# Patient Record
Sex: Male | Born: 1935
Health system: Southern US, Community
[De-identification: ages and names within clinical notes are randomized; demographics above are authoritative.]

## PROBLEM LIST (undated history)

## (undated) DIAGNOSIS — E785 Hyperlipidemia, unspecified: Secondary | ICD-10-CM

## (undated) DIAGNOSIS — I454 Nonspecific intraventricular block: Secondary | ICD-10-CM

## (undated) DIAGNOSIS — M1990 Unspecified osteoarthritis, unspecified site: Secondary | ICD-10-CM

## (undated) DIAGNOSIS — I739 Peripheral vascular disease, unspecified: Secondary | ICD-10-CM

## (undated) DIAGNOSIS — I639 Cerebral infarction, unspecified: Secondary | ICD-10-CM

## (undated) DIAGNOSIS — E7800 Pure hypercholesterolemia, unspecified: Secondary | ICD-10-CM

## (undated) DIAGNOSIS — I10 Essential (primary) hypertension: Secondary | ICD-10-CM

## (undated) HISTORY — DX: Essential (primary) hypertension: I10

## (undated) HISTORY — DX: Cerebral infarction, unspecified: I63.9

## (undated) HISTORY — DX: Hyperlipidemia, unspecified: E78.5

---

## 1999-08-07 DIAGNOSIS — I10 Essential (primary) hypertension: Secondary | ICD-10-CM

## 1999-08-07 DIAGNOSIS — E785 Hyperlipidemia, unspecified: Secondary | ICD-10-CM

## 1999-08-07 HISTORY — DX: Hyperlipidemia, unspecified: E78.5

## 1999-08-07 HISTORY — DX: Essential (primary) hypertension: I10

## 2002-04-01 ENCOUNTER — Encounter: Payer: Self-pay | Admitting: Emergency Medicine

## 2002-04-01 ENCOUNTER — Inpatient Hospital Stay (HOSPITAL_COMMUNITY): Admission: EM | Admit: 2002-04-01 | Discharge: 2002-04-04 | Payer: Self-pay | Admitting: Emergency Medicine

## 2002-04-02 ENCOUNTER — Encounter: Payer: Self-pay | Admitting: Pediatrics

## 2002-04-02 ENCOUNTER — Encounter: Payer: Self-pay | Admitting: *Deleted

## 2002-04-04 ENCOUNTER — Inpatient Hospital Stay (HOSPITAL_COMMUNITY)
Admission: RE | Admit: 2002-04-04 | Discharge: 2002-04-27 | Payer: Self-pay | Admitting: Physical Medicine & Rehabilitation

## 2002-04-06 ENCOUNTER — Encounter: Payer: Self-pay | Admitting: Physical Medicine & Rehabilitation

## 2002-04-23 ENCOUNTER — Encounter: Payer: Self-pay | Admitting: Pediatrics

## 2002-04-23 ENCOUNTER — Encounter: Payer: Self-pay | Admitting: Physical Medicine & Rehabilitation

## 2002-08-24 ENCOUNTER — Encounter
Admission: RE | Admit: 2002-08-24 | Discharge: 2002-11-22 | Payer: Self-pay | Admitting: Physical Medicine & Rehabilitation

## 2002-12-21 ENCOUNTER — Encounter
Admission: RE | Admit: 2002-12-21 | Discharge: 2003-03-21 | Payer: Self-pay | Admitting: Physical Medicine & Rehabilitation

## 2017-03-29 ENCOUNTER — Ambulatory Visit (INDEPENDENT_AMBULATORY_CARE_PROVIDER_SITE_OTHER): Payer: Medicare Other | Admitting: Podiatry

## 2017-03-29 ENCOUNTER — Encounter: Payer: Self-pay | Admitting: Podiatry

## 2017-03-29 VITALS — BP 133/86 | HR 50 | Resp 18

## 2017-03-29 DIAGNOSIS — L84 Corns and callosities: Secondary | ICD-10-CM

## 2017-03-29 DIAGNOSIS — Z8673 Personal history of transient ischemic attack (TIA), and cerebral infarction without residual deficits: Secondary | ICD-10-CM | POA: Diagnosis not present

## 2017-03-29 DIAGNOSIS — R29898 Other symptoms and signs involving the musculoskeletal system: Secondary | ICD-10-CM

## 2017-03-29 DIAGNOSIS — M7740 Metatarsalgia, unspecified foot: Secondary | ICD-10-CM | POA: Diagnosis not present

## 2017-03-29 NOTE — Progress Notes (Signed)
   Subjective:    Patient ID: [name], male    DOB: 1935-10-16, 81 y.o.   MRN: [id number]  HPI  *Goes by [name] Presents the office today for concerns of calluses the balls of his feet as well as requesting new custom shoes. He wears a brace in the right foot secondary to a stroke and he had in 2001. He gets pain involving his foot at times mostly with pressure and weightbearing. Denies any recent injury,. This is a chronic issue. Denies any redness or drainage. Denies any open sores. He has no other concerns today.  Review of Systems  All other systems reviewed and are negative.      Objective:   Physical Exam General: AAO x3, NAD  Dermatological: Skin is warm, dry and supple bilateral. Minimal hyperkeratotic tissue is present right first metatarsal 3 and 5. There is no smoking hyperkeratotic tissue buildup on the left foot. Upon debridement there is no underlying ulceration, drainage or any clinical signs of infection present. Is no open lesions or other pre-ulcer lesions identified today.  Vascular: Dorsalis Pedis artery and Posterior Tibial artery pedal pulses are palpable bilateral with immedate capillary fill time. There is no pain with calf compression, swelling, warmth, erythema.   Neruologic: Sensation decreased on the right side with Sims once the monofilament.   Musculoskeletal: Strength is decreased on the right side secondary to stroke. Is no area pinpoint tenderness. There is plantar flexion of metatarsal heads with atrophy of the fat pad. He has tenderness to palpation submetatarsal 5 bilaterally as well as hyperkeratotic lesion right second metatarsal 3. After debridement was resolution pain. The pain is getting is likely due to the prominent metatarsal heads. No other areas of tenderness. There is no overlying edema, erythema, increase in warmth.   Assessment: Hyperkeratotic lesions 2; right leg weakness secondary to stroke   Plan: -Treatment options  discussed including all alternatives, risks, and complications -Etiology of symptoms were discussed -Lesions were sharply debrided 2 without any complications or bleeding.  -Discussed the new custom shoe. We'll get this prior authorized through his insurance and I discussed this with Dawn. We'll have him come in to see [facility].  -Metatarsal pad added to his inserts today to see if this will help.   Ovid Curd, DPM

## 2017-04-01 ENCOUNTER — Ambulatory Visit: Payer: Medicare Other | Admitting: Orthotics

## 2017-04-01 DIAGNOSIS — L84 Corns and callosities: Secondary | ICD-10-CM

## 2017-04-01 DIAGNOSIS — R29898 Other symptoms and signs involving the musculoskeletal system: Secondary | ICD-10-CM

## 2017-04-01 NOTE — Progress Notes (Signed)
Patient came in for orthopedic shoes that are an integral part of a brace (MAFO).  Patient was referred to [facility] to provide.

## 2017-04-11 ENCOUNTER — Other Ambulatory Visit: Payer: Self-pay | Admitting: Urology

## 2017-04-11 DIAGNOSIS — R972 Elevated prostate specific antigen [PSA]: Secondary | ICD-10-CM

## 2017-05-20 ENCOUNTER — Other Ambulatory Visit: Payer: Self-pay

## 2017-05-28 ENCOUNTER — Ambulatory Visit
Admission: RE | Admit: 2017-05-28 | Discharge: 2017-05-28 | Disposition: A | Discharge status: Home / Self Care | Payer: Medicare Other | Source: Ambulatory Visit | Attending: Urology | Admitting: Urology

## 2017-05-28 DIAGNOSIS — R972 Elevated prostate specific antigen [PSA]: Secondary | ICD-10-CM

## 2017-05-28 MED ORDER — GADOBENATE DIMEGLUMINE 529 MG/ML IV SOLN
15.0000 mL | Freq: Once | INTRAVENOUS | Status: AC | PRN
  Administered 2017-05-28: 15 mL via INTRAVENOUS

## 2017-08-26 ENCOUNTER — Ambulatory Visit (INDEPENDENT_AMBULATORY_CARE_PROVIDER_SITE_OTHER): Payer: Medicare Other | Admitting: Podiatry

## 2017-08-26 DIAGNOSIS — M21379 Foot drop, unspecified foot: Secondary | ICD-10-CM

## 2017-08-26 DIAGNOSIS — Z8673 Personal history of transient ischemic attack (TIA), and cerebral infarction without residual deficits: Secondary | ICD-10-CM

## 2017-08-26 DIAGNOSIS — R2689 Other abnormalities of gait and mobility: Secondary | ICD-10-CM

## 2017-08-27 NOTE — Progress Notes (Signed)
82 year old male presents the office today requesting new prescription for custom shoes.  The prescription expired and he needs a new prescription.  He has no other concerns today.  He denies any open sores or swelling he has no pain to his feet he just needs a new prescription.  A new prescription was provided for [facility] today.  I did check his feet there is no open sores, calluses or any pain or swelling.  Updated prescription was provided today.  He does not want a new brace.  No charge for today's visit  Follow-up after he gets the shoes or sooner if needed.  Discussed daily foot inspection.  [name] DPM

## 2018-05-15 ENCOUNTER — Encounter: Payer: Self-pay | Admitting: Internal Medicine

## 2018-05-15 ENCOUNTER — Ambulatory Visit: Payer: Medicare Other

## 2018-05-15 ENCOUNTER — Ambulatory Visit (INDEPENDENT_AMBULATORY_CARE_PROVIDER_SITE_OTHER): Payer: Medicare Other | Admitting: Internal Medicine

## 2018-05-15 VITALS — BP 122/62 | HR 63 | Temp 97.9°F | Ht 69.0 in | Wt 167.0 lb

## 2018-05-15 DIAGNOSIS — I10 Essential (primary) hypertension: Secondary | ICD-10-CM | POA: Diagnosis not present

## 2018-05-15 DIAGNOSIS — Z23 Encounter for immunization: Secondary | ICD-10-CM | POA: Diagnosis not present

## 2018-05-15 DIAGNOSIS — Z0000 Encounter for general adult medical examination without abnormal findings: Secondary | ICD-10-CM

## 2018-05-15 DIAGNOSIS — R7303 Prediabetes: Secondary | ICD-10-CM | POA: Diagnosis not present

## 2018-05-15 MED ORDER — CLOPIDOGREL BISULFATE 75 MG PO TABS
75.0000 mg | ORAL_TABLET | Freq: Once | ORAL | 0 refills | Status: DC
Start: 2018-05-15 — End: 2018-05-29

## 2018-05-15 NOTE — Progress Notes (Signed)
  Subjective:     Patient ID: [name] , male    DOB: 10-25-1935 , 82 y.o.   MRN: [id number]   Pt is here for HTN FU and came for Medicare visit.  His niece is here with him and states he has been doing fairly well. He takes charge of taking his own meds and she does not need to place them in a med box.  Had a flu shot at [facility] 04/2018 Past Medical History:  Diagnosis Date  . Hyperlipidemia 2001  . Hypertension 2001  . Stroke (HCC)     No Known Allergies  Current Outpatient Medications:  .  aspirin EC 81 MG tablet, Take 81 mg by mouth daily., Disp: , Rfl:  .  cetirizine (ZYRTEC) 10 MG tablet, Take 10 mg by mouth as needed for allergies., Disp: , Rfl:  .  clopidogrel (PLAVIX) 75 MG tablet, , Disp: , Rfl: 0 .  diltiazem (CARDIZEM CD) 180 MG 24 hr capsule, , Disp: , Rfl: 0 .  lisinopril-hydrochlorothiazide (PRINZIDE,ZESTORETIC) 10-12.5 MG tablet, , Disp: , Rfl: 0 .  rosuvastatin (CRESTOR) 10 MG tablet, Take 10 mg by mouth daily., Disp: , Rfl: 0   Review of Systems  Constitutional: Negative for appetite change and diaphoresis.  HENT: Negative for tinnitus.   Cardiovascular: Negative for chest pain, palpitations and leg swelling.  Gastrointestinal: Negative.   Endocrine: Negative for polydipsia and polyphagia.       Denies hypoglycemia  Genitourinary: Negative for dysuria, frequency and urgency.  Musculoskeletal: Positive for gait problem.  Skin: Negative for wound.  Neurological: Negative for numbness and headaches.       Objective:  Physical Exam  Constitutional: He is oriented to person, place, and time. He appears well-developed. No distress.  HENT:  Head: Normocephalic.  Nose: Nose normal.  He is hard of hearing  Eyes: Conjunctivae are normal. No scleral icterus.  Neck: Normal range of motion. Neck supple. No thyromegaly present.  Cardiovascular: Normal rate and regular rhythm.  No murmur heard. Pulmonary/Chest: Effort normal and breath sounds normal.   Lymphadenopathy:    He has no cervical adenopathy.  Neurological: He is alert and oriented to person, place, and time.  Uses a caine, needs assistance to get off the chair to walk. Wears a R wrist brace and R leg brace  Skin: Skin is warm and dry. No rash noted. He is not diaphoretic.  Psychiatric: He has a normal mood and affect. His behavior is normal. Judgment and thought content normal.  Vitals reviewed.   Today's Vitals   05/15/18 1634  BP: 122/62  Pulse: 63  Temp: 97.9 F (36.6 C)  TempSrc: Oral  SpO2: 98%  Weight: 167 lb (75.8 kg)  Height: 5\' 9"  (1.753 m)   Body mass index is 24.66 kg/m. Today's Vitals   05/15/18 1634  BP: 122/62  Pulse: 63  Temp: 97.9 F (36.6 C)  TempSrc: Oral  SpO2: 98%  Weight: 167 lb (75.8 kg)  Height: 5\' 9"  (1.753 m)   Body mass index is 24.66 kg/m. Assessment And Plan:     1. Immunization due  - Pneumococcal conjugate vaccine 13-valent    2- HTN- stable, CBC and CMP ordered. Refill sent. He was not able to void.   3- Prediabetes- HGBA1C ordered  We will call back when results come in. Fu as scheduled in December for yearly physical. He can have lipids done then.   Reagen Haberman RODRIGUEZ-SOUTHWORTH, PA-C

## 2018-05-15 NOTE — Progress Notes (Signed)
Subjective:   [name] is a 82 y.o. male who presents for Medicare Annual/Subsequent preventive examination.  Review of Systems:  N/A Cardiac Risk Factors include: advanced age (>46men, >17 women);hypertension     Objective:    Vitals: BP 122/62 (BP Location: Left Arm)   Pulse 63   Temp 97.9 F (36.6 C)   Ht 5\' 9"  (1.753 m)   Wt 167 lb (75.8 kg)   SpO2 98%   BMI 24.66 kg/m   Body mass index is 24.66 kg/m.  Advanced Directives 05/15/2018  Does Patient Have a Medical Advance Directive? Yes  Type of Advance Directive Healthcare Power of Attorney  Copy of Healthcare Power of Attorney in Chart? No - copy requested    Tobacco Social History   Tobacco Use  Smoking Status Never Smoker  Smokeless Tobacco Never Used     Counseling given: Not Answered   Clinical Intake:  Pre-visit preparation completed: Yes  Pain : No/denies pain Pain Score: 0-No pain     Nutritional Status: BMI of 19-24  Normal Nutritional Risks: None Diabetes: No  How often do you need to have someone help you when you read instructions, pamphlets, or other written materials from your doctor or pharmacy?: 4 - Often What is the last grade level you completed in school?: 7th grade  Interpreter Needed?: No  Information entered by :: NAllenLPN  Past Medical History:  Diagnosis Date  . Hyperlipidemia 2001  . Hypertension 2001  . Stroke [facility])    History reviewed. No pertinent surgical history. Family History  Problem Relation Age of Onset  . Stroke Mother   . Stroke Father   . Heart attack Father   . Cancer Brother    Social History   Socioeconomic History  . Marital status: Single    Spouse name: Not on file  . Number of children: Not on file  . Years of education: Not on file  . Highest education level: Not on file  Occupational History  . Occupation: retired  Engineer, production  . Financial resource strain: Not hard at all  . Food insecurity:    Worry: Never true   Inability: Never true  . Transportation needs:    Medical: No    Non-medical: No  Tobacco Use  . Smoking status: Never Smoker  . Smokeless tobacco: Never Used  Substance and Sexual Activity  . Alcohol use: Yes    Comment: on occassion  . Drug use: Never  . Sexual activity: Not Currently  Lifestyle  . Physical activity:    Days per week: 4 days    Minutes per session: 20 min  . Stress: Not at all  Relationships  . Social connections:    Talks on phone: Not on file    Gets together: Not on file    Attends religious service: Not on file    Active member of club or organization: Not on file    Attends meetings of clubs or organizations: Not on file    Relationship status: Not on file  Other Topics Concern  . Not on file  Social History Narrative  . Not on file    Outpatient Encounter Medications as of 05/15/2018  Medication Sig  . aspirin EC 81 MG tablet Take 81 mg by mouth daily.  . cetirizine (ZYRTEC) 10 MG tablet Take 10 mg by mouth as needed for allergies.  [name] diltiazem (CARDIZEM CD) 180 MG 24 hr capsule   . lisinopril-hydrochlorothiazide (PRINZIDE,ZESTORETIC) 10-12.5 MG tablet   .  rosuvastatin (CRESTOR) 10 MG tablet Take 10 mg by mouth daily.  . [DISCONTINUED] clopidogrel (PLAVIX) 75 MG tablet    No facility-administered encounter medications on file as of 05/15/2018.     Activities of Daily Living In your present state of health, do you have any difficulty performing the following activities: 05/15/2018  Hearing? Y  Comment Has hearing aides, but can not get use to them  Vision? N  Difficulty concentrating or making decisions? N  Walking or climbing stairs? Y  Comment Uses cane  Dressing or bathing? N  Doing errands, shopping? N  Preparing Food and eating ? N  Using the Toilet? N  In the past six months, have you accidently leaked urine? N  Do you have problems with loss of bowel control? N  Managing your Medications? N  Managing your Finances? N    Housekeeping or managing your Housekeeping? N  Some recent data might be hidden    Patient Care Team: Dorothyann Peng, MD as PCP - General (Internal Medicine) Mateo Flow, MD as Consulting Physician (Ophthalmology) [facility] [place], MD as Attending Physician   Assessment:   This is a routine wellness examination for Micajah.  Exercise Activities and Dietary recommendations Current Exercise Habits: Home exercise routine, Type of exercise: walking, Time (Minutes): 20, Frequency (Times/Week): 4, Weekly Exercise (Minutes/Week): 80, Intensity: Mild, Exercise limited by: orthopedic condition(s)  Goals    . DIET - INCREASE WATER INTAKE (pt-stated)     Wants to increase water intake form 2 to 4 glasses a day       Fall Risk Fall Risk  05/15/2018  Falls in the past year? No  Risk for fall due to : Impaired balance/gait;Medication side effect;Impaired mobility   Is the patient's home free of loose throw rugs in walkways, pet beds, electrical cords, etc?   yes      Grab bars in the bathroom? yes      Handrails on the stairs?   N/A      Adequate lighting?   yes  Timed Get Up and Go Performed: N/A  Depression Screen PHQ 2/9 Scores 05/15/2018  PHQ - 2 Score 0  PHQ- 9 Score 0    Cognitive Function     6CIT Screen 05/15/2018  What Year? 4 points  What month? 0 points  What time? 3 points  Count back from 20 4 points  Months in reverse 4 points  Repeat phrase 10 points  Total Score 25    Immunization History  Administered Date(s) Administered  . Influenza-Unspecified 03/24/2018  . Tdap 06/22/2013    Qualifies for Shingles Vaccine? yes  Screening Tests Health Maintenance  Topic Date Due  . PNA vac Low Risk Adult (1 of 2 - PCV13) 09/20/2002  . TETANUS/TDAP  06/23/2023  . INFLUENZA VACCINE  Completed   Cancer Screenings: Lung: Low Dose CT Chest recommended if Age 64-80 years, 30 pack-year currently smoking OR have quit w/in 15years. Patient does not  qualify. Colorectal: up to date   Additional Screenings:  Hepatitis C Screening:N/A      Plan:    Patient showed a memory deficit with 6CIT. Patient due for next colonoscopy in 2021 due to a history of polyps.  I have personally reviewed and noted the following in the patient's chart:   . Medical and social history . Use of alcohol, tobacco or illicit drugs  . Current medications and supplements . Functional ability and status . Nutritional status . Physical activity . Advanced directives .  List of other physicians . Hospitalizations, surgeries, and ER visits in previous 12 months . Vitals . Screenings to include cognitive, depression, and falls . Referrals and appointments  In addition, I have reviewed and discussed with patient certain preventive protocols, quality metrics, and best practice recommendations. A written personalized care plan for preventive services as well as general preventive health recommendations were provided to patient.     Barb Merino, LPN  03/[id number]

## 2018-05-15 NOTE — Patient Instructions (Addendum)
[name] , Thank you for taking time to come for your Medicare Wellness Visit. I appreciate your ongoing commitment to your health goals. Please review the following plan we discussed and let me know if I can assist you in the future.   Screening recommendations/referrals: Colonoscopy: up to date Recommended yearly ophthalmology/optometry visit for glaucoma screening and checkup Recommended yearly dental visit for hygiene and checkup  Vaccinations: Influenza vaccine: 03/24/2018 Pneumococcal vaccine: to be ordered Tdap vaccine: 06/22/2013 Shingles vaccine: decline    Advanced directives: Please bring a copy of your POA (Power of Attorney) and/or Living Will to your next appointment.    Conditions/risks identified: Cognitive decline  Next appointment: 07/18/2018 at 9:00a  Preventive Care 65 Years and Older, Male Preventive care refers to lifestyle choices and visits with your health care provider that can promote health and wellness. What does preventive care include?  A yearly physical exam. This is also called an annual well check.  Dental exams once or twice a year.  Routine eye exams. Ask your health care provider how often you should have your eyes checked.  Personal lifestyle choices, including:  Daily care of your teeth and gums.  Regular physical activity.  Eating a healthy diet.  Avoiding tobacco and drug use.  Limiting alcohol use.  Practicing safe sex.  Taking low doses of aspirin every day.  Taking vitamin and mineral supplements as recommended by your health care provider. What happens during an annual well check? The services and screenings done by your health care provider during your annual well check will depend on your age, overall health, lifestyle risk factors, and family history of disease. Counseling  Your health care provider may ask you questions about your:  Alcohol use.  Tobacco use.  Drug use.  Emotional well-being.  Home and  relationship well-being.  Sexual activity.  Eating habits.  History of falls.  Memory and ability to understand (cognition).  Work and work Astronomer. Screening  You may have the following tests or measurements:  Height, weight, and BMI.  Blood pressure.  Lipid and cholesterol levels. These may be checked every 5 years, or more frequently if you are over 26 years old.  Skin check.  Lung cancer screening. You may have this screening every year starting at age 80 if you have a 30-pack-year history of smoking and currently smoke or have quit within the past 15 years.  Fecal occult blood test (FOBT) of the stool. You may have this test every year starting at age 80.  Flexible sigmoidoscopy or colonoscopy. You may have a sigmoidoscopy every 5 years or a colonoscopy every 10 years starting at age 80.  Prostate cancer screening. Recommendations will vary depending on your family history and other risks.  Hepatitis C blood test.  Hepatitis B blood test.  Sexually transmitted disease (STD) testing.  Diabetes screening. This is done by checking your blood sugar (glucose) after you have not eaten for a while (fasting). You may have this done every 1-3 years.  Abdominal aortic aneurysm (AAA) screening. You may need this if you are a current or former smoker.  Osteoporosis. You may be screened starting at age 80 if you are at high risk. Talk with your health care provider about your test results, treatment options, and if necessary, the need for more tests. Vaccines  Your health care provider may recommend certain vaccines, such as:  Influenza vaccine. This is recommended every year.  Tetanus, diphtheria, and acellular pertussis (Tdap, Td) vaccine. You may  need a Td booster every 10 years.  Zoster vaccine. You may need this after age 60.  Pneumococcal 13-valent conjugate (PCV13) vaccine. One dose is recommended after age 31.  Pneumococcal polysaccharide (PPSV23) vaccine. One  dose is recommended after age 80. Talk to your health care provider about which screenings and vaccines you need and how often you need them. This information is not intended to replace advice given to you by your health care provider. Make sure you discuss any questions you have with your health care provider. Document Released: 08/19/2015 Document Revised: 04/11/2016 Document Reviewed: 05/24/2015 Elsevier Interactive Patient Education  2017 [place] Prevention in the Home Falls can cause injuries. They can happen to people of all ages. There are many things you can do to make your home safe and to help prevent falls. What can I do on the outside of my home?  Regularly fix the edges of walkways and driveways and fix any cracks.  Remove anything that might make you trip as you walk through a door, such as a raised step or threshold.  Trim any bushes or trees on the path to your home.  Use bright outdoor lighting.  Clear any walking paths of anything that might make someone trip, such as rocks or tools.  Regularly check to see if handrails are loose or broken. Make sure that both sides of any steps have handrails.  Any raised decks and porches should have guardrails on the edges.  Have any leaves, snow, or ice cleared regularly.  Use sand or salt on walking paths during winter.  Clean up any spills in your garage right away. This includes oil or grease spills. What can I do in the bathroom?  Use night lights.  Install grab bars by the toilet and in the tub and shower. Do not use towel bars as grab bars.  Use non-skid mats or decals in the tub or shower.  If you need to sit down in the shower, use a plastic, non-slip stool.  Keep the floor dry. Clean up any water that spills on the floor as soon as it happens.  Remove soap buildup in the tub or shower regularly.  Attach bath mats securely with double-sided non-slip rug tape.  Do not have throw rugs and other  things on the floor that can make you trip. What can I do in the bedroom?  Use night lights.  Make sure that you have a light by your bed that is easy to reach.  Do not use any sheets or blankets that are too big for your bed. They should not hang down onto the floor.  Have a firm chair that has side arms. You can use this for support while you get dressed.  Do not have throw rugs and other things on the floor that can make you trip. What can I do in the kitchen?  Clean up any spills right away.  Avoid walking on wet floors.  Keep items that you use a lot in easy-to-reach places.  If you need to reach something above you, use a strong step stool that has a grab bar.  Keep electrical cords out of the way.  Do not use floor polish or wax that makes floors slippery. If you must use wax, use non-skid floor wax.  Do not have throw rugs and other things on the floor that can make you trip. What can I do with my stairs?  Do not leave any items on  the stairs.  Make sure that there are handrails on both sides of the stairs and use them. Fix handrails that are broken or loose. Make sure that handrails are as long as the stairways.  Check any carpeting to make sure that it is firmly attached to the stairs. Fix any carpet that is loose or worn.  Avoid having throw rugs at the top or bottom of the stairs. If you do have throw rugs, attach them to the floor with carpet tape.  Make sure that you have a light switch at the top of the stairs and the bottom of the stairs. If you do not have them, ask someone to add them for you. What else can I do to help prevent falls?  Wear shoes that:  Do not have high heels.  Have rubber bottoms.  Are comfortable and fit you well.  Are closed at the toe. Do not wear sandals.  If you use a stepladder:  Make sure that it is fully opened. Do not climb a closed stepladder.  Make sure that both sides of the stepladder are locked into place.  Ask  someone to hold it for you, if possible.  Clearly mark and make sure that you can see:  Any grab bars or handrails.  First and last steps.  Where the edge of each step is.  Use tools that help you move around (mobility aids) if they are needed. These include:  Canes.  Walkers.  Scooters.  Crutches.  Turn on the lights when you go into a dark area. Replace any light bulbs as soon as they burn out.  Set up your furniture so you have a clear path. Avoid moving your furniture around.  If any of your floors are uneven, fix them.  If there are any pets around you, be aware of where they are.  Review your medicines with your doctor. Some medicines can make you feel dizzy. This can increase your chance of falling. Ask your doctor what other things that you can do to help prevent falls. This information is not intended to replace advice given to you by your health care provider. Make sure you discuss any questions you have with your health care provider. Document Released: 05/19/2009 Document Revised: 12/29/2015 Document Reviewed: 08/27/2014 Elsevier Interactive Patient Education  2017 [employer].

## 2018-05-16 LAB — CMP14 + ANION GAP
ALT: 47 IU/L — ABNORMAL HIGH (ref 0–44)
AST: 34 IU/L (ref 0–40)
Albumin/Globulin Ratio: 2.1 (ref 1.2–2.2)
Albumin: 4.4 g/dL (ref 3.5–4.7)
Alkaline Phosphatase: 82 IU/L (ref 39–117)
Anion Gap: 15 mmol/L (ref 10.0–18.0)
BUN/Creatinine Ratio: 14 (ref 10–24)
BUN: 15 mg/dL (ref 8–27)
Bilirubin Total: 0.6 mg/dL (ref 0.0–1.2)
CO2: 24 mmol/L (ref 20–29)
Calcium: 9.5 mg/dL (ref 8.6–10.2)
Chloride: 98 mmol/L (ref 96–106)
Creatinine, Ser: 1.07 mg/dL (ref 0.76–1.27)
GFR calc Af Amer: 75 mL/min/{1.73_m2} (ref >59)
GFR calc non Af Amer: 65 mL/min/{1.73_m2} (ref >59)
Globulin, Total: 2.1 g/dL (ref 1.5–4.5)
Glucose: 103 mg/dL — ABNORMAL HIGH (ref 65–99)
Potassium: 4.2 mmol/L (ref 3.5–5.2)
Sodium: 137 mmol/L (ref 134–144)
Total Protein: 6.5 g/dL (ref 6.0–8.5)

## 2018-05-16 LAB — CBC
Hematocrit: 38.4 % (ref 37.5–51.0)
Hemoglobin: 13.5 g/dL (ref 13.0–17.7)
MCH: 31.9 pg (ref 26.6–33.0)
MCHC: 35.2 g/dL (ref 31.5–35.7)
MCV: 91 fL (ref 79–97)
Platelets: 260 10*3/uL (ref 150–450)
RBC: 4.23 x10E6/uL (ref 4.14–5.80)
RDW: 15.1 % (ref 12.3–15.4)
WBC: 5.8 10*3/uL (ref 3.4–10.8)

## 2018-05-16 LAB — HEMOGLOBIN A1C
Est. average glucose Bld gHb Est-mCnc: 123 mg/dL
Hgb A1c MFr Bld: 5.9 % — ABNORMAL HIGH (ref 4.8–5.6)

## 2018-05-24 ENCOUNTER — Other Ambulatory Visit: Payer: Self-pay | Admitting: Nurse Practitioner

## 2018-05-29 ENCOUNTER — Other Ambulatory Visit: Payer: Self-pay | Admitting: Internal Medicine

## 2018-06-02 ENCOUNTER — Other Ambulatory Visit: Payer: Self-pay

## 2018-06-02 MED ORDER — CLOPIDOGREL BISULFATE 75 MG PO TABS: 75.0000 mg | ORAL_TABLET | Freq: Every day | ORAL | 0 refills | Status: AC

## 2018-07-01 ENCOUNTER — Other Ambulatory Visit: Payer: Self-pay | Admitting: Nurse Practitioner

## 2018-07-18 ENCOUNTER — Ambulatory Visit: Payer: Medicare Other | Admitting: Nurse Practitioner

## 2018-08-09 ENCOUNTER — Other Ambulatory Visit: Payer: Self-pay | Admitting: Internal Medicine

## 2018-08-09 ENCOUNTER — Other Ambulatory Visit: Payer: Self-pay | Admitting: Nurse Practitioner

## 2018-09-29 ENCOUNTER — Other Ambulatory Visit: Payer: Self-pay | Admitting: Nurse Practitioner

## 2018-11-03 ENCOUNTER — Other Ambulatory Visit: Payer: Self-pay | Admitting: Nurse Practitioner

## 2018-11-05 ENCOUNTER — Other Ambulatory Visit: Payer: Self-pay | Admitting: Nurse Practitioner

## 2018-11-17 ENCOUNTER — Ambulatory Visit: Payer: Medicare Other | Admitting: Nurse Practitioner

## 2018-11-18 ENCOUNTER — Other Ambulatory Visit: Payer: Self-pay

## 2018-11-18 ENCOUNTER — Ambulatory Visit (INDEPENDENT_AMBULATORY_CARE_PROVIDER_SITE_OTHER): Payer: Medicare Other | Admitting: Nurse Practitioner

## 2018-11-18 ENCOUNTER — Encounter: Payer: Self-pay | Admitting: Nurse Practitioner

## 2018-11-18 DIAGNOSIS — I69351 Hemiplegia and hemiparesis following cerebral infarction affecting right dominant side: Secondary | ICD-10-CM | POA: Diagnosis not present

## 2018-11-18 DIAGNOSIS — R7303 Prediabetes: Secondary | ICD-10-CM

## 2018-11-18 DIAGNOSIS — G8191 Hemiplegia, unspecified affecting right dominant side: Secondary | ICD-10-CM

## 2018-11-18 DIAGNOSIS — I639 Cerebral infarction, unspecified: Secondary | ICD-10-CM

## 2018-11-18 DIAGNOSIS — I10 Essential (primary) hypertension: Secondary | ICD-10-CM

## 2018-11-18 NOTE — Progress Notes (Signed)
This visit type was conducted due to national recommendations for restrictions regarding the COVID-19 Pandemic (e.g. social distancing). This format is felt to be most appropriate for this patient at this time.  All issues noted in this document were discussed and addressed.  No physical exam was performed (except for noted visual exam findings with Video Visits).  Please refer to the patient's chart (MyChart message for video visits and phone note for telephone visits) for the patient's consent to telehealth for [facility].   Subjective:     Patient ID: [name] , male    DOB: 1936-08-05 , 83 y.o.   MRN: [id number]  Virtual Visit via Telephone Note  I connected with Theodis Aguas and his Niece on 11/18/18 at  3:15 PM EDT by telephone and verified that I am speaking with the correct person using two identifiers.   I discussed the limitations, risks, security and privacy concerns of performing an evaluation and management service by telephone and the availability of in person appointments. I also discussed with the patient that there may be a patient responsible charge related to this service. The patient expressed understanding and agreed to proceed.  Chief Complaint  Patient presents with  . Hypertension    History of Present Illness:   108/57 HR 60  Family is with him [name]   Hypertension  This is a chronic problem. The current episode started more than 1 year ago. The problem is unchanged. The problem is controlled. Pertinent negatives include no anxiety, blurred vision, chest pain, palpitations or shortness of breath. There are no associated agents to hypertension. Risk factors for coronary artery disease include male gender and sedentary lifestyle. Past treatments include ACE inhibitors and diuretics. Hypertensive end-organ damage includes CVA. There is no history of angina. There is no history of a thyroid problem.     Past Medical History:  Diagnosis Date  . Hyperlipidemia  2001  . Hypertension 2001  . Stroke [facility])      Family History  Problem Relation Age of Onset  . Stroke Mother   . Stroke Father   . Heart attack Father   . Cancer Brother      Current Outpatient Medications:  .  aspirin EC 81 MG tablet, Take 81 mg by mouth daily., Disp: , Rfl:  .  cetirizine (ZYRTEC) 10 MG tablet, Take 10 mg by mouth as needed for allergies., Disp: , Rfl:  .  clopidogrel (PLAVIX) 75 MG tablet, TAKE 1 TABLET BY MOUTH ONCE DAILY, Disp: 90 tablet, Rfl: 0 .  diltiazem (TIAZAC) 180 MG 24 hr capsule, Take 180 mg by mouth daily., Disp: , Rfl:  .  lisinopril-hydrochlorothiazide (PRINZIDE,ZESTORETIC) 10-12.5 MG tablet, TAKE 1 TABLET BY MOUTH DAILY, Disp: 90 tablet, Rfl: 0 .  rosuvastatin (CRESTOR) 10 MG tablet, TAKE 1 TABLET BY MOUTH EVERY DAY, Disp: 90 tablet, Rfl: 0 .  CARTIA XT 180 MG 24 hr capsule, TAKE 1 CAPSULE BY MOUTH ONCE DAILY (Patient not taking: Reported on 11/18/2018), Disp: 90 capsule, Rfl: 0   No Known Allergies   Review of Systems  Eyes: Negative for blurred vision.  Respiratory: Negative for cough, shortness of breath and wheezing.   Cardiovascular: Negative.  Negative for chest pain, palpitations and leg swelling.     There were no vitals filed for this visit.  Observations/Objective: No acute distress, trace edema to bilateral lower extremities Right arm and leg in brace       Assessment and Plan: 1. Prediabetes  Chronic, no  current medications  Continue with current medications  Encouraged to limit intake of sugary foods and drinks  Encouraged to increase physical activity to 150 minutes per week - Referral to Chronic Care Management Services  2. Essential hypertension . No blood pressure this visit was a telephone visit . No labs this visit he is moderate risk for COVID-19 and would like to wait until improves - Referral to Chronic Care Management Services  3. Cerebrovascular accident (CVA), unspecified mechanism [facility])  Niece would  like to see if there are any resources available for him with personal care - Referral to Chronic Care Management Services  4. Right hemiparesis (HCC)  Due to CVA - in arm and leg brace - Referral to Chronic Care Management Services   Follow Up Instructions:   I discussed the assessment and treatment plan with the patient. The patient was provided an opportunity to ask questions and all were answered. The patient agreed with the plan and demonstrated an understanding of the instructions.   The patient was advised to call back or seek an in-person evaluation if the symptoms worsen or if the condition fails to improve as anticipated.  COVID-19 Education: The signs and symptoms of COVID-19 were discussed with the patient and how to seek care for testing (follow up with PCP or arrange E-visit).  The importance of social distancing was discussed today.   Patient Risk:   After full review of this patients clinical status, I feel that they are at least moderate risk at this time.   I provided 15 minutes of non-face-to-face time during this encounter.   [name], FNP

## 2018-11-21 ENCOUNTER — Ambulatory Visit (INDEPENDENT_AMBULATORY_CARE_PROVIDER_SITE_OTHER): Payer: Medicare Other

## 2018-11-21 DIAGNOSIS — R7303 Prediabetes: Secondary | ICD-10-CM

## 2018-11-21 DIAGNOSIS — I10 Essential (primary) hypertension: Secondary | ICD-10-CM

## 2018-11-21 DIAGNOSIS — I639 Cerebral infarction, unspecified: Secondary | ICD-10-CM

## 2018-11-21 NOTE — Chronic Care Management (AMB) (Signed)
Chronic Care Management    Clinical Social Work General Note  11/21/2018 Name: [name] MRN: [id number] DOB: 03-Nov-1935  [name] is a 83 y.o. year old male who is a primary care patient of [name] FNP. The CCM was consulted to assist the patient with chronic care management and care coordination of patient needs.   I placed an initial call to the patients caregiver and family member [name] as directed in patient referral. I obtained two HIPAA identifiers and reviewd CCM program guidelines.  [name] was given information about Chronic Care Management services today including:  1. CCM service includes personalized support from designated clinical staff supervised by his physician, including individualized plan of care and coordination with other care providers 2. 24/7 contact phone numbers for assistance for urgent and routine care needs. 3. Service will only be billed when office clinical staff spend 20 minutes or more in a month to coordinate care. 4. Only one practitioner may furnish and bill the service in a calendar month. 5. The patient may stop CCM services at any time (effective at the end of the month) by phone call to the office staff. 6. The patient will be responsible for cost sharing (co-pay) of up to 20% of the service fee (after annual deductible is met).  Patient/caregiver agreed to services and verbal consent obtained.   Review of patient status, including review of consultants reports, relevant laboratory and other test results, and collaboration with appropriate care team members and the patient's provider was performed as part of comprehensive patient evaluation and provision of chronic care management services.    SDOH (Social Determinants of Health) screening performed today. See Care Plan Entry related to challenges with:  Transportation Physical Activity  Goals Addressed            This Visit's Progress   . "he has a hard time  accessing [facility] due to right sided weakness"       Family/ caregiver stated  Current Barriers:  . Difficulty climbing steps to board the bus  . Declining functionality . Decreased ROM and strength  Clinical Social Work Clinical Goal(s):  [name] Over the next 30 days, client will follow up with SCAT as directed by SW to discuss options to better suite the patients needs.  Interventions: . Interviewed patients caregiver to determine patient functional ability and needs . Reviewed current SCAT services and different types of vehicles available to meet community member needs . [place] with SCAT requesting information on how the patient may utilize cars instead of buses within the [employer]  Patient Self Care Activities:  . Attends all scheduled provider appointments . Calls provider office for new concerns or questions  . Arranges transportation via [facility]  Initial goal documentation     . "he needs a better option for medication packaging"       Caregiver/Family member stated  Current Barriers:  . ADL IADL limitations - right sided weakness from CVA, difficulty opening bottles . Lacks knowledge of pill packaging options  Clinical Social Work Clinical Goal(s):  [name] Over the next 3- days, patient will work with CCM Pharmacist to address needs related to medication accesibility  Interventions: . Discussed plans with patient for ongoing care management follow up and provided patient with direct contact information for care management team  . Collaborated with CCM pharmacist to discuss patient goal. Pharmacist to follow up  Patient Self Care Activities:  . Performs ADL's independently . Performs IADL's  independently . Calls provider office for new concerns or questions  Initial goal documentation     . "he needs a life alert that works when he is away from home"       Family stated  Current Barriers:  . Limited options for wide range life alert systems . Lacks  knowledge of a company who provides life alerts to work outside of the home  [place]):  [name] Over the next 40 days, client will work with SW to address concerns related to obtaining a life alert to meet the patients needs  Interventions: . Interviewed family Midwife regarding desired life alert system . Researched life alert options . Scheduled follow up call to discuss findings  Patient Self Care Activities:  . Calls provider office for new concerns or questions  Initial goal documentation     . "he needs a service to assist with grocery delivery"       Family member/ caregiver stated  Current Barriers:  . Lack of transportation . Concerns of patient in public due to [place]- 19 pandemic . Lacks knowledge of community resources to assist with grocery delivery  Clinical Social Work Clinical Goal(s):  [name] Over the next 30 days, client will follow up with [facility] [place] as directed by SW  Interventions: . Discussed plans with patient for ongoing care management follow up and provided patient with direct contact information for care management team  . Provided with information about [employer] [facility] [place] volunteer program . Referred the patient to [employer] [facility] [place] volunteer grocery shopping program  Patient Self Care Activities:  . Currently UNABLE TO independently shop for own groceries  Initial goal documentation     . "he needs to work on range of motion and arthitic pain"       Family member/ caregiver stated:  Current Barriers:  . ADL IADL limitations . Social Isolation  . Lacks knowledge of reasons for in home therapy  Clinical Social Work Clinical Goal(s):  [name] Over the next 30 days, client will work with SW to address concerns related to desire to engage in exercise regimen  Interventions: . Discussed plans with patient for ongoing care management follow up and provided patient with direct contact  information for care management team  . Family member/caregiver interviewed regarding patient abilities to perform ADL's and iADL's . Educated the family member on the abilities of home health and the difference between PT and OT . Collaborated with the patients primary care provider to communicate the desire to begin home health therapy for increased ROM and functional ability within the home  Patient Self Care Activities:  . Attends all scheduled provider appointments . Calls pharmacy for medication refills . Calls provider office for new concerns or questions  Initial goal documentation         Follow Up Plan: SW will follow up with patient by phone over the next 10-14 days.        Daneen Schick, BSW, CDP [facility] / [employer] Care Management Social Worker [phone]  Total time spent performing care coordination and/or care management activities with the patient by phone or face to face = 60 minutes.

## 2018-11-21 NOTE — Patient Instructions (Signed)
Social Worker Visit Information  Goals we discussed today:  Goals Addressed            This Visit's Progress   . "he has a hard time accessing [facility] due to right sided weakness"       Family/ caregiver stated  Current Barriers:  . Difficulty climbing steps to board the bus  . Declining functionality . Decreased ROM and strength  Clinical Social Work Clinical Goal(s):  [name] Over the next 30 days, client will follow up with [facility] as directed by SW to discuss options to better suite the patients needs.  Interventions: . Interviewed patients caregiver to determine patient functional ability and needs . Reviewed current SCAT services and different types of vehicles available to meet community member needs . [name] with SCAT requesting information on how the patient may utilize cars instead of buses within the [employer]  Patient Self Care Activities:  . Attends all scheduled provider appointments . Calls provider office for new concerns or questions  . Arranges transportation via [facility]  Initial goal documentation     . "he needs a better option for medication packaging"       Caregiver/Family member stated  Current Barriers:  . ADL IADL limitations - right sided weakness from CVA, difficulty opening bottles . Lacks knowledge of pill packaging options  Clinical Social Work Clinical Goal(s):  [name] Over the next 3- days, patient will work with CCM Pharmacist to address needs related to medication accesibility  Interventions: . Discussed plans with patient for ongoing care management follow up and provided patient with direct contact information for care management team  . Collaborated with CCM pharmacist to discuss patient goal. Pharmacist to follow up  Patient Self Care Activities:  . Performs ADL's independently . Performs IADL's independently . Calls provider office for new concerns or questions  Initial goal documentation     . "he needs a life alert that  works when he is away from home"       Family stated  Current Barriers:  . Limited options for wide range life alert systems . Lacks knowledge of a company who provides life alerts to work outside of the home  [place]):  [name] Over the next 40 days, client will work with SW to address concerns related to obtaining a life alert to meet the patients needs  Interventions: . Interviewed family Midwife regarding desired life alert system . Researched life alert options . Scheduled follow up call to discuss findings  Patient Self Care Activities:  . Calls provider office for new concerns or questions  Initial goal documentation     . "he needs a service to assist with grocery delivery"       Family member/ caregiver stated  Current Barriers:  . Lack of transportation . Concerns of patient in public due to [place]- 19 pandemic . Lacks knowledge of community resources to assist with grocery delivery  Clinical Social Work Clinical Goal(s):  [name] Over the next 30 days, client will follow up with [facility] [place] as directed by SW  Interventions: . Discussed plans with patient for ongoing care management follow up and provided patient with direct contact information for care management team  . Provided with information about [employer] [facility] [place] volunteer program . Referred the patient to [employer] [facility] [place] volunteer grocery shopping program  Patient Self Care Activities:  . Currently UNABLE TO independently shop for own groceries  Initial goal  documentation     . "he needs to work on range of motion and arthitic pain"       Family member/ caregiver stated:  Current Barriers:  . ADL IADL limitations . Social Isolation  . Lacks knowledge of reasons for in home therapy  Clinical Social Work Clinical Goal(s):  [name] Over the next 30 days, client will work with SW to address concerns related to desire to engage in exercise  regimen  Interventions: . Discussed plans with patient for ongoing care management follow up and provided patient with direct contact information for care management team  . Family member/caregiver interviewed regarding patient abilities to perform ADL's and iADL's . Educated the family member on the abilities of home health and the difference between PT and OT . Collaborated with the patients primary care provider to communicate the desire to begin home health therapy for increased ROM and functional ability within the home  Patient Self Care Activities:  . Attends all scheduled provider appointments . Calls pharmacy for medication refills . Calls provider office for new concerns or questions  Initial goal documentation         Materials provided: Verbal education about CCM program provided by phone  [name] was given information about Chronic Care Management services today including:  1. CCM service includes personalized support from designated clinical staff supervised by his physician, including individualized plan of care and coordination with other care providers 2. 24/7 contact phone numbers for assistance for urgent and routine care needs. 3. Service will only be billed when office clinical staff spend 20 minutes or more in a month to coordinate care. 4. Only one practitioner may furnish and bill the service in a calendar month. 5. The patient may stop CCM services at any time (effective at the end of the month) by phone call to the office staff. 6. The patient will be responsible for cost sharing (co-pay) of up to 20% of the service fee (after annual deductible is met).  Patient agreed to services and verbal consent obtained.   The patient verbalized understanding of instructions provided today and declined a print copy of patient instruction materials.   Follow up plan: SW will follow up with patient by phone over the next 10-14 days   Daneen Schick, BSW, CDP [facility] /  [place] Management Social Worker [phone]

## 2018-11-21 NOTE — Chronic Care Management (AMB) (Signed)
Chronic Care Management   Initial Visit Note  11/21/2018 Name: [name] MRN: [id number] DOB: [birth year]  Referred by: Dorothyann Peng, MD Reason for referral : Care Coordination   [name] is a 83 y.o. year old male who is a primary care patient of Dorothyann Peng, MD. The CCM team was consulted for assistance with chronic disease management and care coordination needs.   Review of patient status, including review of consultants reports, relevant laboratory and other test results, and collaboration with appropriate care team members and the patient's provider was performed as part of comprehensive patient evaluation and provision of chronic care management services.    I initiated and established the plan of care for [name] during one on one collaboration with my clinical care management colleague [name] who is also engaged with this patient to address social work needs.   Goals Addressed    . "he has a hard time accessing [facility] due to right sided weakness"       Family/ caregiver stated  Current Barriers:  . Difficulty climbing steps to board the bus  . Declining functionality . Decreased ROM and strength  Clinical Social Work Clinical Goal(s):  [name] Over the next 30 days, client will follow up with [facility] as directed by SW to discuss options to better suite the patients needs.  Interventions: . Interviewed patients caregiver to determine patient functional ability and needs . Reviewed current SCAT services and different types of vehicles available to meet community member needs . Contacted [name] with [facility] requesting information on how the patient may utilize cars instead of buses within the [employer]  Patient Self Care Activities:  . Attends all scheduled provider appointments . Calls provider office for new concerns or questions  . Arranges transportation via [facility]  Initial goal documentation     . "he needs a better option for  medication packaging"       Caregiver/Family member stated  Current Barriers:  . ADL IADL limitations - right sided weakness from CVA, difficulty opening bottles . Lacks knowledge of pill packaging options  Clinical Social Work Clinical Goal(s):  [name] Over the next 3- days, patient will work with CCM Pharmacist to address needs related to medication accesibility  Interventions: . Discussed plans with patient for ongoing care management follow up and provided patient with direct contact information for care management team  . Collaborated with CCM pharmacist to discuss patient goal. Pharmacist to follow up  Patient Self Care Activities:  . Performs ADL's independently . Performs IADL's independently . Calls provider office for new concerns or questions  Initial goal documentation     . "he needs a life alert that works when he is away from home"       Family stated  Current Barriers:  . Limited options for wide range life alert systems . Lacks knowledge of a company who provides life alerts to work outside of the home  Clinical Social Work Clinical Goal(s):  [name] Over the next 40 days, client will work with SW to address concerns related to obtaining a life alert to meet the patients needs  Interventions: . Interviewed family Adult nurse regarding desired life alert system . Researched life alert options . Scheduled follow up call to discuss findings  Patient Self Care Activities:  . Calls provider office for new concerns or questions  Initial goal documentation     . "he needs a service to assist with grocery delivery"  Family member/ caregiver stated  Current Barriers:  . Lack of transportation . Concerns of patient in public due to COVID- 19 pandemic . Lacks knowledge of community resources to assist with grocery delivery  Clinical Social Work Clinical Goal(s):  [name] Over the next 30 days, client will follow up with [facility] [place] as directed by SW   Interventions: . Discussed plans with patient for ongoing care management follow up and provided patient with direct contact information for care management team  . Provided with information about [employer] [facility] [place] volunteer program . Referred the patient to [employer] [place] volunteer grocery shopping program  Patient Self Care Activities:  . Currently UNABLE TO independently shop for own groceries  Initial goal documentation     . "he needs to work on range of motion and arthitic pain"       Family member/ caregiver stated:  Current Barriers:  . ADL IADL limitations . Social Isolation  . Lacks knowledge of reasons for in home therapy  Clinical Social Work Clinical Goal(s):  [name] Over the next 30 days, client will work with SW to address concerns related to desire to engage in exercise regimen  Interventions: . Discussed plans with patient for ongoing care management follow up and provided patient with direct contact information for care management team  . Family member/caregiver interviewed regarding patient abilities to perform ADL's and iADL's . Educated the family member on the abilities of home health and the difference between PT and OT . Collaborated with the patients primary care provider to communicate the desire to begin home health therapy for increased ROM and functional ability within the home  Patient Self Care Activities:  . Attends all scheduled provider appointments . Calls pharmacy for medication refills . Calls provider office for new concerns or questions  Initial goal documentation     . Assist with Disease Management and Care Coordination Needs       Current Barriers:  [name] Knowledge Barriers related to resources and support available to address needs related to arthritic pain, medication management and community resources  Case Manager Clinical Goal(s):  [name] Over the next 30 days, patient will work with the CCM team to address needs  related to resources to help with medication management, transportation, home safety and home delivered meals/groceries  Interventions:  . Collaborated with BSW and initiated plan of care to address needs related to patient specified disease management, community resources and care coordination  Patient Self Care Activities:  . Calls provider office for new concerns or questions  Initial goal documentation         Telephone follow up appointment with CCM team member scheduled for: 7-14 days Delsa Sale, [facility] Care Management Coordinator [facility] Care Management/[facility]  Direct Phone: [phone]

## 2018-11-24 ENCOUNTER — Telehealth: Payer: Self-pay

## 2018-11-25 ENCOUNTER — Other Ambulatory Visit: Payer: Self-pay

## 2018-11-25 ENCOUNTER — Ambulatory Visit: Payer: Self-pay

## 2018-11-25 ENCOUNTER — Ambulatory Visit: Payer: Self-pay | Admitting: Pharmacist

## 2018-11-25 ENCOUNTER — Telehealth: Payer: Self-pay

## 2018-11-25 DIAGNOSIS — I10 Essential (primary) hypertension: Secondary | ICD-10-CM

## 2018-11-25 DIAGNOSIS — I639 Cerebral infarction, unspecified: Secondary | ICD-10-CM | POA: Diagnosis not present

## 2018-11-25 DIAGNOSIS — R7303 Prediabetes: Secondary | ICD-10-CM

## 2018-11-25 DIAGNOSIS — G8191 Hemiplegia, unspecified affecting right dominant side: Secondary | ICD-10-CM

## 2018-11-26 ENCOUNTER — Telehealth: Payer: Self-pay | Admitting: Pharmacist

## 2018-11-26 NOTE — Patient Instructions (Signed)
Visit Information  Goals Addressed    . "He needs to work on his ROM and dexterity"       Niece stated  Current Barriers:  . Impaired Physical Mobility  . Impaired dexterity functionality  Nurse Case Manager Clinical Goal(s):  [name] Over the next 30 days, patient/niece will verbalize understanding of plan for in home PT/OT for HSE and therapy services to help with balance, strengthening and help improve dexterity.   Interventions:   Collaborative CCM initial telephone outreach completed with niece Weldon Picking and embedded Pharm D Vanice Sarah for goal setting  . Evaluation of current treatment plan related to balance and mobility and patient's adherence to plan as established by provider  Assessed for home safety concerns, assessed for current DME use (pt has a 4 legged cane used for ambulation, he has a scooter for outside use) (niece is working with embedded BSW for additional resources, including a home safety alert)  Discussed niece's request for in home PT/OT referral to assist with strengthening, balance and dexterity-niece prefers to call me with a preferred [facility] provider . Reviewed medications with niece, assessed for knowledge and understanding for indication, dosage and frequency of prescribed medications, assessed for financial hardship with paying for medications - no discrepancies noted . Collaborated with provider [name] regarding follow up on referral request for in home PT/OT . Discussed plans with patient for ongoing care management follow up and provided patient with direct contact information for care management team . Provided niece with RN CM contact # and hours of availability . Scheduled a CCM telephone follow up call with niece for about 2 weeks   Patient Self Care Activities:  . Attends all scheduled provider appointments . Calls pharmacy for medication refills . Calls provider office for new concerns or questions  Initial goal documentation    . "It would  be good for him to learn what foods to eat"       Niece stated  Current Barriers:  [name] Knowledge Deficits related to Diabetes disease process and Self Health Management   Nurse Case Manager Clinical Goal(s):  [name] Over the next 60 days, patient will work with the CCM team  to address needs related to Diabetes disease education and nutritional recommendations.   Interventions:   Collaborative CCM initial telephone outreach completed with niece Weldon Picking and embedded Pharm D Vanice Sarah for goal setting  . Evaluation of current treatment plan related to prediabetes and patient's adherence to plan as established by provider. . Provided education to patient/niece re: patient's most recent A1C, education provided related to A1C target and range for prediabetes/diabetes . Discussed plans with patient for ongoing care management follow up and provided patient with direct contact information for care management team . Provided patient/niece with printed educational materials related to Diabetes meal planning, Know your A1C and Diabetes disease process . Provided niece with RN CM contact # and hours of availability . Scheduled a CCM telephone follow up call with niece for about 2 weeks   Patient Self Care Activities:  . Attends all scheduled provider appointments . Calls pharmacy for medication refills . Calls provider office for new concerns or questions  Initial goal documentation       The patient verbalized understanding of instructions provided today and declined a print copy of patient instruction materials.   The CCM team will reach out to the patient again over the next 7-14 days.    Delsa Sale, RN,CCM Care Management Coordinator [facility] Care  Management/[facility]  Direct Phone: [phone]

## 2018-11-26 NOTE — Chronic Care Management (AMB) (Signed)
Chronic Care Management   Initial Visit Note  11/25/2018 Name: [name] MRN: [id number] DOB: [birth year]  Referred by: Dorothyann Peng, MD Reason for referral : Chronic Care Management (INITIAL CCM RN TELEPHONE OUTREACH )   [name] is a 83 y.o. year old male who is a primary care patient of Dorothyann Peng, MD. The CCM team was consulted for assistance with chronic disease management and care coordination needs.   Review of patient status, including review of consultants reports, relevant laboratory and other test results, and collaboration with appropriate care team members and the patient's provider was performed as part of comprehensive patient evaluation and provision of chronic care management services.    I spoke with patient's niece Weldon Picking by telephone today.   Objective:  Lab Results  Component Value Date   HGBA1C 5.9 (H) 05/15/2018   Lab Results  Component Value Date   CREATININE 1.07 05/15/2018   BP Readings from Last 3 Encounters:  05/15/18 122/62  05/15/18 122/62  03/29/17 133/86    Goals Addressed    . "He needs to work on his ROM and dexterity"       Niece stated  Current Barriers:  . Impaired Physical Mobility  . Impaired dexterity functionality  Nurse Case Manager Clinical Goal(s):  [name] Over the next 30 days, patient/niece will verbalize understanding of plan for in home PT/OT for HSE and therapy services to help with balance, strengthening and help improve dexterity.   Interventions:   Collaborative CCM initial telephone outreach completed with niece Weldon Picking and embedded Pharm D Vanice Sarah for goal setting  . Evaluation of current treatment plan related to balance and mobility and patient's adherence to plan as established by provider  Assessed for home safety concerns, assessed for current DME use (pt has a 4 legged cane used for ambulation, he has a scooter for outside use) (niece is working with embedded BSW for additional  resources, including a home safety alert)  Discussed niece's request for in home PT/OT referral to assist with strengthening, balance and dexterity-niece will call to provide preferred [facility] agency  . Reviewed medications with niece, assessed for knowledge and understanding for indication, dosage and frequency of prescribed medications, assessed for financial hardship with paying for medications - no discrepancies noted . Collaborated with provider [name] regarding follow up on referral request for in home PT/OT . Discussed plans with patient for ongoing care management follow up and provided patient with direct contact information for care management team . Provided niece with RN CM contact # and hours of availability . Scheduled a CCM telephone follow up call with niece for about 2 weeks   Patient Self Care Activities:  . Attends all scheduled provider appointments . Calls pharmacy for medication refills . Calls provider office for new concerns or questions  Initial goal documentation    . "It would be good for him to learn what foods to eat"       Niece stated  Current Barriers:  [name] Knowledge Deficits related to Diabetes disease process and Self Health Management   Nurse Case Manager Clinical Goal(s):  [name] Over the next 60 days, patient will work with the CCM team  to address needs related to Diabetes disease education and nutritional recommendations.   Interventions:   Collaborative CCM initial telephone outreach completed with niece Weldon Picking and embedded Pharm D Vanice Sarah for goal setting  . Evaluation of current treatment plan related to prediabetes and patient's adherence to  plan as established by provider. . Provided education to patient/niece re: patient's most recent A1C, education provided related to A1C target and range for prediabetes/diabetes . Discussed plans with patient for ongoing care management follow up and provided patient with direct contact information  for care management team . Provided patient/niece with printed educational materials related to Diabetes meal planning, Know your A1C and Diabetes disease process . Provided niece with RN CM contact # and hours of availability . Scheduled a CCM telephone follow up call with niece for about 2 weeks   Patient Self Care Activities:  . Attends all scheduled provider appointments . Calls pharmacy for medication refills . Calls provider office for new concerns or questions  Initial goal documentation       RN CM will follow up with niece again in about 2 weeks to review mailed Diabetes education  Delsa Sale, [facility] Care Management Coordinator [facility] Care Management/[facility]  Direct Phone: [phone]

## 2018-11-27 ENCOUNTER — Ambulatory Visit: Payer: Self-pay

## 2018-11-27 ENCOUNTER — Telehealth: Payer: Self-pay

## 2018-11-27 DIAGNOSIS — I10 Essential (primary) hypertension: Secondary | ICD-10-CM

## 2018-11-27 DIAGNOSIS — I639 Cerebral infarction, unspecified: Secondary | ICD-10-CM

## 2018-11-27 DIAGNOSIS — R7303 Prediabetes: Secondary | ICD-10-CM

## 2018-11-27 NOTE — Patient Instructions (Signed)
Social Worker Visit Information  Goals we discussed today:  Goals Addressed            This Visit's Progress   . "he has a hard time accessing [facility] due to right sided weakness"       Family/ caregiver stated  Current Barriers:  . Difficulty climbing steps to board the bus  . Declining functionality . Decreased ROM and strength  Clinical Social Work Clinical Goal(s):  [name] Over the next 30 days, client will follow up with [facility] as directed by SW to discuss options to better suite the patients needs.  Interventions: . Collaboration with [name] from [facility] who reports she can place a note in the patients file to utilize the lift . Telephonic outreach to patients niece, Weldon Picking, to discuss possible change - left voice message requesting a return call  Patient Self Care Activities:  . Attends all scheduled provider appointments . Calls provider office for new concerns or questions  . Arranges transportation via [facility]  Please see past updates related to this goal by clicking on the "Past Updates" button in the selected goal      . "he needs a life alert that works when he is away from home"       Family stated  Current Barriers:  . Limited options for wide range life alert systems . Lacks knowledge of a company who provides life alerts to work outside of the home  Clinical Social Work Clinical Goal(s):  [name] Over the next 40 days, client will work with SW to address concerns related to obtaining a life alert to meet the patients needs  Interventions: . Identified life alert option through [employer] that works in and out of the home "Go Safe 2" . Telephonic follow up to the patients niece, Weldon Picking to review pricing - left voice message requesting a return call  Patient Self Care Activities:  . Calls provider office for new concerns or questions  Please see past updates related to this goal by clicking on the "Past Updates" button in the selected goal           Materials Provided: No. Patient not reached.  Follow Up Plan: SW will follow up with patient by phone over the next 7 days   Bevelyn Ngo, [place], CDP [facility] / [facility] Care Management Social Worker [phone]

## 2018-11-27 NOTE — Patient Instructions (Signed)
Visit Information  Goals Addressed            This Visit's Progress     Patient Stated   . "he needs a better option for medication packaging" (pt-stated)       Caregiver/Family member stated  Current Barriers:  . ADL IADL limitations - right sided weakness from CVA, difficulty opening bottles . Lacks knowledge of pill packaging options  Clinical Social Work Clinical Goal(s):  [name] Over the next 14 days, patient will work with CCM Pharmacist to address needs related to medication accessibility . Over the next 30 days, patient with work with CCM team to increase medication adherence with improved modifications/adjustments to medication organization strategy.  Interventions: . Will mail patient once daily/once weekly pill box that comes with easy open tabs for his daily medications.  Niece requested once daily pull box as patient only takes medications once daily.  . Discusessed non-safety caps with Niece.  Will call [facility] to discuss non-safety cap options & discuss with Niece.   These non-caps are usually easy "pop off" tabs or easy "screw tabs" . Comprehensive medication review performed.  No affordability issues. No adverse events.  Patient Self Care Activities:  . Performs ADL's independently . Performs IADL's independently . Calls provider office for new concerns or questions  Please see past updates related to this goal by clicking on the "Past Updates" button in the selected goal         The patient verbalized understanding of instructions provided today and declined a print copy of patient instruction materials.   The CM team will reach out to the patient again over the next 7 days.   Kieth Brightly, PharmD, BCPS Clinical Pharmacist, [facility]  [facility]  Direct Dial: [phone]

## 2018-11-27 NOTE — Chronic Care Management (AMB) (Signed)
  Chronic Care Management    Clinical Social Work Follow Up Note  11/27/2018 Name: [name] MRN: [id number] DOB: 1936/01/09  [name] is a 83 y.o. year old male who is a primary care patient of William Felts FNP The CCM team was consulted for assistance with chronic care management and care coordination.  Review of patient status, including review of consultants reports, other relevant assessments, and collaboration with appropriate care team members and the patient's provider was performed as part of comprehensive patient evaluation and provision of chronic care management services.     I placed an unsuccessful call to the patients niece, [name] to assess the progression of patient goals. I left a HIPAA compliant voice message requesting a return call.  Goals Addressed            This Visit's Progress   . "he has a hard time accessing [facility] due to right sided weakness"       Family/ caregiver stated  Current Barriers:  . Difficulty climbing steps to board the bus  . Declining functionality . Decreased ROM and strength  Clinical Social Work Clinical Goal(s):  [name] Over the next 30 days, client will follow up with [facility] as directed by SW to discuss options to better suite the patients needs.  Interventions: . Collaboration with [name] from [facility] who reports she can place a note in the patients file to utilize the lift . Telephonic outreach to patients niece, [name], to discuss possible change - left voice message requesting a return call  Patient Self Care Activities:  . Attends all scheduled provider appointments . Calls provider office for new concerns or questions  . Arranges transportation via [facility]  Please see past updates related to this goal by clicking on the "Past Updates" button in the selected goal      . "he needs a life alert that works when he is away from home"       Family stated  Current Barriers:  . Limited options for wide  range life alert systems . Lacks knowledge of a company who provides life alerts to work outside of the home  Clinical Social Work Clinical Goal(s):  [name] Over the next 40 days, client will work with SW to address concerns related to obtaining a life alert to meet the patients needs  Interventions: . Identified life alert option through [employer] that works in and out of the home "Go Safe 2" . Telephonic follow up to the patients niece, [name] to review pricing - left voice message requesting a return call  Patient Self Care Activities:  . Calls provider office for new concerns or questions  Please see past updates related to this goal by clicking on the "Past Updates" button in the selected goal          Follow Up Plan: SW will follow up with patient by phone over the next 7 days.   [name], BSW, CDP [name] Care Management Social Worker [phone]  Total time spent performing care coordination and/or care management activities with the patient by phone or face to face = 8 minutes.

## 2018-11-27 NOTE — Progress Notes (Signed)
  Chronic Care Management   Initial Visit Note  11/27/2018 Name: [name] MRN: [id number] DOB: [birth year]  Referred by: Dorothyann Peng, MD Reason for referral : Chronic Care Management-->medication management   [name] is a 83 y.o. year old male who is a primary care patient of Dorothyann Peng, MD. The CCM team was consulted for assistance with chronic disease management and care coordination needs.   Review of patient status, including review of consultants reports, relevant laboratory and other test results, and collaboration with appropriate care team members and the patient's provider was performed as part of comprehensive patient evaluation and provision of chronic care management services.    I spoke with patient's niece Weldon Picking by telephone today.   Objective:   Goals Addressed            This Visit's Progress     Patient Stated   . "Better options for medication packaging s/p CVA" (pt-stated)       Caregiver/Family member stated  Current Barriers:  . ADL IADL limitations - right sided weakness from CVA, difficulty opening bottles . Lacks knowledge of pill packaging options  Clinical Social Work Clinical Goal(s):  [name] Over the next 14 days, patient will work with CCM Pharmacist to address needs related to medication accessibility . Over the next 30 days, patient with work with CCM team to increase medication adherence with improved modifications/adjustments to medication organization strategy.  Interventions:  Collaborative CCM initial telephone outreach completed with niece Weldon Picking and embedded Pharm D Vanice Sarah for goal setting . Will mail patient once daily/once weekly pill box that comes with easy open tabs for his daily medications.  Niece requested once daily pull box as patient only takes medications once daily.  . Discusessed non-safety caps with Niece.  Will call [facility] to discuss non-safety cap options & discuss with Niece.   These  non-caps are usually easy "pop off" tabs or easy "screw tabs" . Comprehensive medication review performed.  No affordability issues. No adverse events.  Patient Self Care Activities:  . Performs ADL's independently . Performs IADL's independently . Calls provider office for new concerns or questions  Please see past updates related to this goal by clicking on the "Past Updates" button in the selected goal          PLAN: The CM team will reach out to the patient again over the next 7 days.  Encouraged niece to call should any pharmacy issues arise.  Kieth Brightly, PharmD, BCPS Clinical Pharmacist, [facility]  [facility]  Direct Dial: [phone]

## 2018-11-28 ENCOUNTER — Ambulatory Visit: Payer: Self-pay

## 2018-11-28 DIAGNOSIS — I10 Essential (primary) hypertension: Secondary | ICD-10-CM

## 2018-11-28 DIAGNOSIS — I639 Cerebral infarction, unspecified: Secondary | ICD-10-CM

## 2018-11-28 DIAGNOSIS — R7303 Prediabetes: Secondary | ICD-10-CM

## 2018-11-28 NOTE — Patient Instructions (Signed)
Social Worker Visit Information  Goals we discussed today:  Goals Addressed            This Visit's Progress   . COMPLETED: "he has a hard time accessing [facility] due to right sided weakness"   On track    Family/ caregiver stated  Current Barriers:  . Difficulty climbing steps to board the bus  . Declining functionality . Decreased ROM and strength  Clinical Social Work Clinical Goal(s):  [name] Over the next 30 days, client will follow up with [facility] as directed by SW to discuss options to better suite the patients needs.  Interventions: . Educated the patients niece on the option to have patient access [facility] vehicles via ramp . Contacted [name] with [employer] to request patients file be updated to communicate patient needs with drivers  Patient Self Care Activities:  . Attends all scheduled provider appointments . Calls provider office for new concerns or questions  . Arranges transportation via [facility]  Please see past updates related to this goal by clicking on the "Past Updates" button in the selected goal      . "he needs a life alert that works when he is away from home"   On track    Family stated  Current Barriers:  . Limited options for wide range life alert systems . Lacks knowledge of a company who provides life alerts to work outside of the home  Clinical Social Work Clinical Goal(s):  [name] Over the next 40 days, client will work with SW to address concerns related to obtaining a life alert to meet the patients needs  Interventions: . Telephonic follow up by CCM SW . Provided patients niece, [name] with information on [employer] "GO Safe 2" . Encouraged [name] to review website and look into other "Mobile life alerts" that may be more cost effective  Patient Self Care Activities:  . Calls provider office for new concerns or questions  Please see past updates related to this goal by clicking on the "Past Updates" button in the selected goal      . "he needs a  service to assist with grocery delivery"   Not on track    Family member/ caregiver stated  Current Barriers:  . Lack of transportation . Concerns of patient in public due to COVID- 19 pandemic . Lacks knowledge of community resources to assist with grocery delivery  Clinical Social Work Clinical Goal(s):  [name] Over the next 30 days, client will follow up with [facility] [place] as directed by SW  Interventions: . Telephonic check in by CCM SW . Collaboration with Publishing rights manager via e-mail to follow up on patient's goal  Patient Self Care Activities:  . Currently UNABLE TO independently shop for own groceries  Please see past updates related to this goal by clicking on the "Past Updates" button in the selected goal      . "he needs to work on range of motion and arthitic pain"   On track    Family member/ caregiver stated:  Current Barriers:  . ADL IADL limitations . Social Isolation  . Lacks knowledge of reasons for in home therapy  Clinical Social Work Clinical Goal(s):  [name] Over the next 30 days, client will work with SW to address concerns related to desire to engage in exercise regimen  Interventions: . Telephonic follow up by CCM SW . Educated the patients niece on the difference between an adult day center and a senior center .  Collaboration with [facility] and [facility] to place the patients niece on their mailing lists . Encouraged patients niece to review newsletters to identify activities and outings the patient may enjoy  Patient Self Care Activities:  . Attends all scheduled provider appointments . Calls pharmacy for medication refills . Calls provider office for new concerns or questions  Please see past updates related to this goal by clicking on the "Past Updates" button in the selected goal      . "I want to learn more about the PACE program"       Niece stated Current Barriers:  . Limited knowledge of  what a PACE program covers  Clinical Social Work Clinical Goal(s):  [name] Over the next 30 days the patients niece will be able to verbalize more understanding about the [facility] [place] program.  Interventions: . Educated the patients niece on what PACE programs assist with . Provided the patients niece with contact information to [facility] [place] . Encouraged the patients niece to contact this program directly to discuss cost considering the patient does not have Medicaid  Patient Self Care Activities:  . Attends all scheduled provider appointments . Calls pharmacy for medication refills . Calls provider office for new concerns or questions  Initial goal documentation         Materials Provided: Verbal education about community resources provided by phone  Follow Up Plan: SW will follow up with patient by phone over the next 2-3 weeks   Bevelyn Ngo, [place], CDP [facility] / [facility] Care Management Social Worker [phone]

## 2018-11-28 NOTE — Chronic Care Management (AMB) (Signed)
Chronic Care Management    Clinical Social Work Follow Up Note  11/28/2018 Name: MALVERN BOBER MRN: [id number] DOB: 04/02/1936  [name] is a 83 y.o. year old male who is a primary care patient of Dorothyann Peng, MD. The CCM team was consulted for assistance with care coordination of patient resource needs.   Review of patient status, including review of consultants reports, other relevant assessments, and collaboration with appropriate care team members and the patient's provider was performed as part of comprehensive patient evaluation and provision of chronic care management services.    I received a return call from the patients niece Weldon Picking. I spoke with Mrs Sandy Salaam to assess the progression of patient goals.   Goals Addressed            This Visit's Progress   . COMPLETED: "he has a hard time accessing [facility] due to right sided weakness"   On track    Family/ caregiver stated  Current Barriers:  . Difficulty climbing steps to board the bus  . Declining functionality . Decreased ROM and strength  Clinical Social Work Clinical Goal(s):  [name] Over the next 30 days, client will follow up with [facility] as directed by SW to discuss options to better suite the patients needs.  Interventions: . Educated the patients niece on the option to have patient access [facility] vehicles via ramp . Contacted [name] with [employer] to request patients file be updated to communicate patient needs with drivers  Patient Self Care Activities:  . Attends all scheduled provider appointments . Calls provider office for new concerns or questions  . Arranges transportation via [facility]  Please see past updates related to this goal by clicking on the "Past Updates" button in the selected goal      . "he needs a life alert that works when he is away from home"   On track    Family stated  Current Barriers:  . Limited options for wide range life alert systems . Lacks knowledge of a company who  provides life alerts to work outside of the home  Clinical Social Work Clinical Goal(s):  [name] Over the next 40 days, client will work with SW to address concerns related to obtaining a life alert to meet the patients needs  Interventions: . Telephonic follow up by CCM SW . Provided patients niece, [name] with information on [employer] "GO Safe 2" . Encouraged [name] to review website and look into other "Mobile life alerts" that may be more cost effective  Patient Self Care Activities:  . Calls provider office for new concerns or questions  Please see past updates related to this goal by clicking on the "Past Updates" button in the selected goal      . "he needs a service to assist with grocery delivery"   Not on track    Family member/ caregiver stated  Current Barriers:  . Lack of transportation . Concerns of patient in public due to COVID- 19 pandemic . Lacks knowledge of community resources to assist with grocery delivery  Clinical Social Work Clinical Goal(s):  [name] Over the next 30 days, client will follow up with [facility] [place] as directed by SW  Interventions: . Telephonic check in by CCM SW . Collaboration with Publishing rights manager via e-mail to follow up on patient's goal  Patient Self Care Activities:  . Currently UNABLE TO independently shop for own groceries  Please see past updates related to this goal  by clicking on the "Past Updates" button in the selected goal      . "he needs to work on range of motion and arthitic pain"   On track    Family member/ caregiver stated:  Current Barriers:  . ADL IADL limitations . Social Isolation  . Lacks knowledge of reasons for in home therapy  Clinical Social Work Clinical Goal(s):  [name] Over the next 30 days, client will work with SW to address concerns related to desire to engage in exercise regimen  Interventions: . Telephonic follow up by CCM SW . Educated the patients niece on the difference  between an adult day center and a senior center . Collaboration with [facility] and [facility] to place the patients niece on their mailing lists . Encouraged patients niece to review newsletters to identify activities and outings the patient may enjoy  Patient Self Care Activities:  . Attends all scheduled provider appointments . Calls pharmacy for medication refills . Calls provider office for new concerns or questions  Please see past updates related to this goal by clicking on the "Past Updates" button in the selected goal      . "I want to learn more about the PACE program"       Niece stated Current Barriers:  . Limited knowledge of what a PACE program covers  Clinical Social Work Clinical Goal(s):  [name] Over the next 30 days the patients niece will be able to verbalize more understanding about the [facility] [place] program.  Interventions: . Educated the patients niece on what PACE programs assist with . Provided the patients niece with contact information to [facility] [place] . Encouraged the patients niece to contact this program directly to discuss cost considering the patient does not have Medicaid  Patient Self Care Activities:  . Attends all scheduled provider appointments . Calls pharmacy for medication refills . Calls provider office for new concerns or questions  Initial goal documentation         Follow Up Plan: SW will follow up with patient by phone over the next 2-3 weeks.   Bevelyn Ngo, BSW, CDP [facility] / [facility] Care Management Social Worker [phone]  Total time spent performing care coordination and/or care management activities with the patient by phone or face to face = 45 minutes.

## 2018-12-01 ENCOUNTER — Telehealth: Payer: Self-pay | Admitting: Pharmacist

## 2018-12-01 ENCOUNTER — Ambulatory Visit: Payer: Self-pay | Admitting: Pharmacist

## 2018-12-01 ENCOUNTER — Telehealth: Payer: Self-pay

## 2018-12-01 DIAGNOSIS — R7303 Prediabetes: Secondary | ICD-10-CM

## 2018-12-01 DIAGNOSIS — I10 Essential (primary) hypertension: Secondary | ICD-10-CM | POA: Diagnosis not present

## 2018-12-01 DIAGNOSIS — I639 Cerebral infarction, unspecified: Secondary | ICD-10-CM | POA: Diagnosis not present

## 2018-12-01 NOTE — Progress Notes (Signed)
  Chronic Care Management   Follow Up Visit Note  12/01/2018 Name: [name] MRN: [id number] DOB: [birth year]  Referred by: Dorothyann Peng, MD Reason for referral : Chronic Care Management-->medication management   [name] is a 83 y.o. year old male who is a primary care patient of Dorothyann Peng, MD. The CCM team was consulted for assistance with chronic disease management and care coordination needs.   Review of patient status, including review of consultants reports, relevant laboratory and other test results, and collaboration with appropriate care team members and the patient's provider was performed as part of comprehensive patient evaluation and provision of chronic care management services.    Objective:   Goals Addressed            This Visit's Progress     Patient Stated   . "Better options for medication packaging s/p CVA" (pt-stated)       Caregiver/Family member stated  Current Barriers:  . ADL IADL limitations - right sided weakness from CVA, difficulty opening bottles . Lacks knowledge of pill packaging options  Clinical Social Work Clinical Goal(s):  [name] Over the next 14 days, patient will work with CCM Pharmacist to address needs related to medication accessibility . Over the next 30 days, patient with work with CCM team to increase medication adherence with improved modifications/adjustments to medication organization strategy.  Interventions: [name] Mailed patient once daily/once weekly pill box that comes with easy open tabs for his daily medications.  He should receive this week.  Niece requested once daily pull box as patient only takes medications once daily.  . Discusessed non-safety caps with Niece.  Call placed to [facility] to flag patient chart for "non safety caps".  Next refills will have easy open caps. . Call placed to niece-->Instructed niece that if she unscrews his current pill bottles & flips the lid -->then screws it back on, these are  "non safety tops" that are easy to open. . Comprehensive medication review performed.  No affordability issues. No adverse events. . Will discuss which option is best for patient once they are able to test each.  Patient Self Care Activities:  . Performs ADL's independently . Performs IADL's independently . Calls provider office for new concerns or questions  Please see past updates related to this goal by clicking on the "Past Updates" button in the selected goal         PLAN: The CM team will reach out to the patient again over the next 14 days.   Kieth Brightly, PharmD, BCPS Clinical Pharmacist, [facility]  [facility]  Direct Dial: [phone]

## 2018-12-01 NOTE — Patient Instructions (Signed)
Visit Information  Goals Addressed            This Visit's Progress     Patient Stated   . "Better options for medication packaging s/p CVA" (pt-stated)       Caregiver/Family member stated  Current Barriers:  . ADL IADL limitations - right sided weakness from CVA, difficulty opening bottles . Lacks knowledge of pill packaging options  Clinical Social Work Clinical Goal(s):  [name] Over the next 14 days, patient will work with CCM Pharmacist to address needs related to medication accessibility . Over the next 30 days, patient with work with CCM team to increase medication adherence with improved modifications/adjustments to medication organization strategy.  Interventions: [name] Mailed patient once daily/once weekly pill box that comes with easy open tabs for his daily medications.  He should receive this week.  Niece requested once daily pull box as patient only takes medications once daily.  . Discusessed non-safety caps with Niece.  Call placed to [facility] to flag patient chart for "non safety caps".  Next refills will have easy open caps. . Call placed to niece-->Instructed niece that if she unscrews his current pill bottles & flips the lid -->then screws it back on, these are "non safety tops" that are easy to open. . Comprehensive medication review performed.  No affordability issues. No adverse events. . Will discuss which option is best for patient once they are able to test each.  Patient Self Care Activities:  . Performs ADL's independently . Performs IADL's independently . Calls provider office for new concerns or questions  Please see past updates related to this goal by clicking on the "Past Updates" button in the selected goal         The patient verbalized understanding of instructions provided today and declined a print copy of patient instruction materials.   The CM team will reach out to the patient again over the next 14 days.   Kieth Brightly, PharmD,  BCPS Clinical Pharmacist, [facility]  [facility]  Direct Dial: [phone]

## 2018-12-04 ENCOUNTER — Telehealth: Payer: Self-pay

## 2018-12-04 ENCOUNTER — Ambulatory Visit: Payer: Self-pay

## 2018-12-04 DIAGNOSIS — R7303 Prediabetes: Secondary | ICD-10-CM

## 2018-12-04 DIAGNOSIS — I10 Essential (primary) hypertension: Secondary | ICD-10-CM

## 2018-12-04 DIAGNOSIS — I639 Cerebral infarction, unspecified: Secondary | ICD-10-CM

## 2018-12-04 NOTE — Chronic Care Management (AMB) (Signed)
  Chronic Care Management    Clinical Social Work Follow Up Note  12/04/2018 Name: [name] MRN: [id number] DOB: 10/18/1935  [name] is a 83 y.o. year old male who is a primary care patient of William Peng, MD. The CCM team was consulted for assistance with [name].   Review of patient status, including review of consultants reports, other relevant assessments, and collaboration with appropriate care team members and the patient's provider was performed as part of comprehensive patient evaluation and provision of chronic care management services.     I placed an unsuccessful call to the patients niece to follow up on the progress of patient goals.  Goals Addressed            This Visit's Progress   . "he needs a service to assist with grocery delivery"       Family member/ caregiver stated  Current Barriers:  . Lack of transportation . Concerns of patient in public due to COVID- 19 pandemic . Lacks knowledge of community resources to assist with grocery delivery  Clinical Social Work Clinical Goal(s):  [name] Over the next 30 days, client will follow up with [facility] [place] as directed by SW  Interventions: . Received confirmation from [name], Chiropodist with [employer] [place], the patients referral was received and family member was outreached - this outreach was unsuccessful . Unsuccessful telephonic follow up by CCM SW to assist with referral  Patient Self Care Activities:  . Currently UNABLE TO independently shop for own groceries  Please see past updates related to this goal by clicking on the "Past Updates" button in the selected goal          Follow Up Plan: SW will follow up by phone over the next week.   [name], BSW, CDP [facility] / [facility] Care Management Social Worker [phone]  Total time spent performing care coordination and/or care management activities with the patient by phone or face to  face = 10 minutes.

## 2018-12-04 NOTE — Patient Instructions (Signed)
Social Worker Visit Information  Goals we discussed today:  Goals Addressed            This Visit's Progress   . "he needs a service to assist with grocery delivery"       Family member/ caregiver stated  Current Barriers:  . Lack of transportation . Concerns of patient in public due to COVID- 19 pandemic . Lacks knowledge of community resources to assist with grocery delivery  Clinical Social Work Clinical Goal(s):  [name] Over the next 30 days, client will follow up with [facility] [place] as directed by SW  Interventions: . Received confirmation from Elijah Birk, Chiropodist with [employer] [place], the patients referral was received and family member was outreached - this outreach was unsuccessful . Unsuccessful telephonic follow up by CCM SW to assist with referral  Patient Self Care Activities:  . Currently UNABLE TO independently shop for own groceries  Please see past updates related to this goal by clicking on the "Past Updates" button in the selected goal          Materials Provided: No. Patient not reached.  Follow Up Plan: SW will follow up in the next week  Bevelyn Ngo, BSW, CDP [facility] / [facility] Care Management Social Worker [phone]

## 2018-12-10 ENCOUNTER — Ambulatory Visit: Payer: Self-pay

## 2018-12-10 DIAGNOSIS — I639 Cerebral infarction, unspecified: Secondary | ICD-10-CM

## 2018-12-10 DIAGNOSIS — I10 Essential (primary) hypertension: Secondary | ICD-10-CM

## 2018-12-10 DIAGNOSIS — R7303 Prediabetes: Secondary | ICD-10-CM

## 2018-12-10 NOTE — Patient Instructions (Signed)
Social Worker Visit Information  Goals we discussed today:  Goals Addressed            This Visit's Progress   . "he needs a service to assist with grocery delivery"   On track    Family member/ caregiver stated  Current Barriers:  . Lack of transportation . Concerns of patient in public due to COVID- 19 pandemic . Lacks knowledge of community resources to assist with grocery delivery  Clinical Social Work Clinical Goal(s):  [name] Over the next 30 days, client will follow up with [facility] [place] as directed by SW  Interventions: . Telephonic follow up by CCM SW . Reviewed recent referral to [employer] [place] to assist the patient with grocery services . Provided the patients niece, [name], with the contact number to Elijah Birk to enroll the patient in grocery program  Patient Self Care Activities:  . Currently UNABLE TO independently shop for own groceries  Please see past updates related to this goal by clicking on the "Past Updates" button in the selected goal      . "I want to learn more about the [facility]ACE program"   On track    Niece stated Current Barriers:  . Limited knowledge of what a PACE program covers  Clinical Social Work Clinical Goal(s):  [name] Over the next 30 days the patients niece will be able to verbalize more understanding about the [facility] [place] program.  Interventions:  Telephonic follow up by CCM SW to the patients niece, [name]  Reviewed PACE information from last outreach encounter  Assessed for any new questions as well as the status of [name] contacting [facility] [place]  Determined there are no new questions at this time - [name] reports she has not yet contacted [facility] due a recent death in the family. She still plans to contact them for more information in the coming weeks.  Patient Self Care Activities:  . Attends all scheduled provider appointments . Calls pharmacy for medication refills . Calls provider office for new  concerns or questions  Please see past updates related to this goal by clicking on the "Past Updates" button in the selected goal          Materials Provided: Verbal education about community resource provided by phone  Follow Up Plan: SW will follow up with patient by phone over the next 3-4 weeks  Bevelyn Ngo, [place], CDP [facility] / [facility] Care Management Social Worker [phone]

## 2018-12-10 NOTE — Chronic Care Management (AMB) (Signed)
  Chronic Care Management    Clinical Social Work Follow Up Note  12/10/2018 Name: DERIEL CAVANAH MRN: [id number] DOB: 10-06-1935  [name] is a 83 y.o. year old male who is a primary care patient of [name], FNP. The CCM team was consulted for assistance with [name].   Review of patient status, including review of consultants reports, other relevant assessments, and collaboration with appropriate care team members and the patient's provider was performed as part of comprehensive patient evaluation and provision of chronic care management services.    I placed a successful call to the patient's niece, [name] to follow up on the progression of patient goals.   Goals Addressed            This Visit's Progress   . "he needs a service to assist with grocery delivery"   On track    Family member/ caregiver stated  Current Barriers:  . Lack of transportation . Concerns of patient in public due to COVID- 19 pandemic . Lacks knowledge of community resources to assist with grocery delivery  Clinical Social Work Clinical Goal(s):  [name] Over the next 30 days, client will follow up with [facility] [place] as directed by SW  Interventions: . Telephonic follow up by CCM SW . Reviewed recent referral to [employer] [facility] [place] to assist the patient with grocery services . Provided the patients niece, [name], with the contact number to [facility] to enroll the patient in grocery program  Patient Self Care Activities:  . Currently UNABLE TO independently shop for own groceries  Please see past updates related to this goal by clicking on the "Past Updates" button in the selected goal      . "I want to learn more about the [facility]ACE program"   On track    Niece stated Current Barriers:  . Limited knowledge of what a PACE program covers  Clinical Social Work Clinical Goal(s):  [name] Over the next 30 days the patients niece will be able to verbalize more  understanding about the [facility] [place] [facility].  Interventions:  Telephonic follow up by CCM SW to the patients niece, [name]  Reviewed [facility] information from last outreach encounter  Assessed for any new questions as well as the status of [name] contacting [facility]  Determined there are no new questions at this time - [name] reports she has not yet contacted [facility] due a recent death in the family. She still plans to contact them for more information in the coming weeks.  Patient Self Care Activities:  . Attends all scheduled provider appointments . Calls pharmacy for medication refills . Calls provider office for new concerns or questions  Please see past updates related to this goal by clicking on the "Past Updates" button in the selected goal          Follow Up Plan: SW will follow up with patient by phone over the next 3-4 weeks.   Bevelyn Ngo, BSW, CDP [facility] Care Management Social Worker [phone]  Total time spent performing care coordination and/or care management activities with the patient by phone or face to face = 15 minutes.

## 2018-12-16 ENCOUNTER — Telehealth: Payer: Self-pay

## 2018-12-19 ENCOUNTER — Telehealth: Payer: Self-pay

## 2018-12-24 ENCOUNTER — Telehealth: Payer: Self-pay

## 2018-12-25 ENCOUNTER — Ambulatory Visit (INDEPENDENT_AMBULATORY_CARE_PROVIDER_SITE_OTHER): Payer: Medicare Other | Admitting: Pharmacist

## 2018-12-25 DIAGNOSIS — I10 Essential (primary) hypertension: Secondary | ICD-10-CM

## 2018-12-25 DIAGNOSIS — R7303 Prediabetes: Secondary | ICD-10-CM

## 2018-12-25 DIAGNOSIS — I639 Cerebral infarction, unspecified: Secondary | ICD-10-CM

## 2018-12-25 NOTE — Patient Instructions (Signed)
Visit Information  Goals Addressed            This Visit's Progress     Patient Stated   . "Better options for medication packaging s/p CVA" (pt-stated)       Caregiver/Family member stated  Current Barriers:  . ADL IADL limitations - right sided weakness from CVA, difficulty opening bottles . Lacks knowledge of pill packaging options  Clinical Social Work Clinical Goal(s):  [name] Over the next 14 days, patient will work with CCM Pharmacist to address needs related to medication accessibility . Over the next 30 days, patient with work with CCM team to increase medication adherence with improved modifications/adjustments to medication organization strategy.  Interventions: [name] Mailed patient once daily/once weekly pill box that comes with easy open tabs for his daily medications. Niece requested once daily pull box as patient only takes medications once daily. (Received) . Discusessed non-safety caps with Niece.  Arranged for [facility] to initiate "easy open" OR "non-safety tops" for patient's next refills.  This strategy is working well . Reminded niece--> if she unscrews tops to his current pill bottles & flips the lid -->then screws it back on, these are "non safety tops" that are "easy to open." . Comprehensive medication review performed.  No affordability issues. No adverse events. No changes to medications. Labs reviewed. . Will discuss which option is best for patient at next visit  Patient Self Care Activities:  . Performs ADL's independently . Performs IADL's independently . Calls provider office for new concerns or questions  Please see past updates related to this goal by clicking on the "Past Updates" button in the selected goal         The patient verbalized understanding of instructions provided today and declined a print copy of patient instruction materials.   The CM team will reach out to the patient again over the next 3 weeks.  Kieth Brightly, PharmD,  BCPS Clinical Pharmacist, [facility]  [facility]  Direct Dial: [phone]

## 2018-12-25 NOTE — Progress Notes (Signed)
  Chronic Care Management   Visit Note  12/25/2018 Name: [name] MRN: [id number] DOB: May 17, 1936  Referred by: Dorothyann Peng, MD Reason for referral : Chronic Care Management   [name] is a 83 y.o. year old male who is a primary care patient of Dorothyann Peng, MD. The CCM team was consulted for assistance with chronic disease management and care coordination needs.   Review of patient status, including review of consultants reports, relevant laboratory and other test results, and collaboration with appropriate care team members and the patient's provider was performed as part of comprehensive patient evaluation and provision of chronic care management services.    I spoke with patient's niece, [name] by telephone today.  Objective:   Goals Addressed            This Visit's Progress     Patient Stated   . "Better options for medication packaging s/p CVA" (pt-stated)       Caregiver/Family member stated  Current Barriers:  . ADL IADL limitations - right sided weakness from CVA, difficulty opening bottles . Lacks knowledge of pill packaging options  Clinical Social Work Clinical Goal(s):  [name] Over the next 14 days, patient will work with CCM Pharmacist to address needs related to medication accessibility . Over the next 30 days, patient with work with CCM team to increase medication adherence with improved modifications/adjustments to medication organization strategy.  Interventions: [name] Mailed patient once daily/once weekly pill box that comes with easy open tabs for his daily medications. Niece requested once daily pull box as patient only takes medications once daily.  . Discusessed non-safety caps with Niece.  Arranged for [facility] to initiate "easy open" OR "non-safety tops" for patient's next refills.  This strategy is working well . Reminded niece--> if she unscrews tops to his current pill bottles & flips the lid -->then screws it back on, these are "non safety  tops" that are "easy to open." . Comprehensive medication review performed.  No affordability issues. No adverse events. No changes to medications. Labs reviewed. . Will discuss which option is best for patient at next visit  Patient Self Care Activities:  . Performs ADL's independently . Performs IADL's independently . Calls provider office for new concerns or questions  Please see past updates related to this goal by clicking on the "Past Updates" button in the selected goal       Plan:   The CM team will reach out to the patient again over the next 3 weeks days.   Kieth Brightly, PharmD, BCPS Clinical Pharmacist, [facility]  [facility]  Direct Dial: [phone]

## 2018-12-30 ENCOUNTER — Other Ambulatory Visit: Payer: Self-pay | Admitting: Nurse Practitioner

## 2018-12-30 MED ORDER — ROSUVASTATIN CALCIUM 10 MG PO TABS: ORAL_TABLET | ORAL | 1 refills | Status: DC

## 2019-01-01 ENCOUNTER — Telehealth: Payer: Self-pay

## 2019-01-01 ENCOUNTER — Ambulatory Visit: Payer: Self-pay

## 2019-01-01 DIAGNOSIS — I10 Essential (primary) hypertension: Secondary | ICD-10-CM

## 2019-01-01 NOTE — Chronic Care Management (AMB) (Signed)
  Chronic Care Management   Outreach Note  01/01/2019 Name: [name] MRN: [id number] DOB: March 31, 1936  Referred by: [name], FNP Reason for referral : Care Coordination   An unsuccessful telephone outreach was attempted today. The patient was referred to the case management team by for assistance with chronic care management and care coordination.   Follow Up Plan: A HIPPA compliant phone message was left for the patient providing contact information and requesting a return call.  The CM team will reach out to the patient again over the next 10-14 business days.   Bevelyn Ngo, BSW, CDP Social Worker, Certified Dementia Practitioner [facility] Care Management [phone]  Total time spent performing care coordination and/or care management activities with the patient by phone or face to face = 3 minutes.

## 2019-01-12 ENCOUNTER — Ambulatory Visit: Payer: Self-pay

## 2019-01-12 ENCOUNTER — Telehealth: Payer: Self-pay

## 2019-01-12 DIAGNOSIS — R7303 Prediabetes: Secondary | ICD-10-CM

## 2019-01-12 DIAGNOSIS — I10 Essential (primary) hypertension: Secondary | ICD-10-CM

## 2019-01-12 NOTE — Chronic Care Management (AMB) (Signed)
  Chronic Care Management   Outreach Note  01/12/2019 Name: [name] MRN: [id number] DOB: 1936-06-25  Referred by: [name], FNP Reason for referral : Care Coordination   A second unsuccessful telephone outreach was attempted today. The patient was referred to the case management team for assistance with chronic care management and care coordination.   Follow Up Plan: A HIPPA compliant phone message was left for the patient providing contact information and requesting a return call.  The care management team will reach out to the patient again over the next 10-14 days.   Daneen Schick, BSW, CDP Social Worker, Certified Dementia Practitioner [facility] [place] [facility] [phone]  Total time spent performing care coordination and/or care management activities with the patient by phone or face to face = 5 minutes.

## 2019-01-13 ENCOUNTER — Telehealth: Payer: Self-pay

## 2019-01-16 ENCOUNTER — Telehealth: Payer: Self-pay

## 2019-01-20 ENCOUNTER — Other Ambulatory Visit: Payer: Self-pay

## 2019-01-20 ENCOUNTER — Ambulatory Visit (INDEPENDENT_AMBULATORY_CARE_PROVIDER_SITE_OTHER): Payer: Medicare Other

## 2019-01-20 ENCOUNTER — Other Ambulatory Visit: Payer: Self-pay | Admitting: Podiatry

## 2019-01-20 ENCOUNTER — Ambulatory Visit: Payer: Medicare Other | Admitting: Podiatry

## 2019-01-20 ENCOUNTER — Encounter: Payer: Self-pay | Admitting: Podiatry

## 2019-01-20 VITALS — Temp 97.7°F

## 2019-01-20 DIAGNOSIS — M79674 Pain in right toe(s): Secondary | ICD-10-CM | POA: Diagnosis not present

## 2019-01-20 DIAGNOSIS — Q828 Other specified congenital malformations of skin: Secondary | ICD-10-CM

## 2019-01-20 DIAGNOSIS — M7750 Other enthesopathy of unspecified foot: Secondary | ICD-10-CM

## 2019-01-20 DIAGNOSIS — M722 Plantar fascial fibromatosis: Secondary | ICD-10-CM

## 2019-01-20 DIAGNOSIS — M25473 Effusion, unspecified ankle: Secondary | ICD-10-CM

## 2019-01-20 DIAGNOSIS — I739 Peripheral vascular disease, unspecified: Secondary | ICD-10-CM | POA: Diagnosis not present

## 2019-01-20 DIAGNOSIS — M79675 Pain in left toe(s): Secondary | ICD-10-CM | POA: Diagnosis not present

## 2019-01-20 DIAGNOSIS — M779 Enthesopathy, unspecified: Secondary | ICD-10-CM

## 2019-01-20 DIAGNOSIS — M21379 Foot drop, unspecified foot: Secondary | ICD-10-CM

## 2019-01-20 DIAGNOSIS — B351 Tinea unguium: Secondary | ICD-10-CM | POA: Diagnosis not present

## 2019-01-20 DIAGNOSIS — R2689 Other abnormalities of gait and mobility: Secondary | ICD-10-CM

## 2019-01-20 MED ORDER — DICLOFENAC SODIUM 1 % TD GEL: 2.0000 g | Freq: Four times a day (QID) | TRANSDERMAL | 2 refills | Status: DC

## 2019-01-21 ENCOUNTER — Telehealth: Payer: Self-pay

## 2019-01-21 NOTE — Telephone Encounter (Signed)
Patient's niece returned our call regarding pt she had called to make an appointment because he is having some problems and she thinks they are ok with just going to see his Foot doctor and he has evaluated the pt and gave them some recommendations and they will follow those and if things dont work out they will call us back about an appointment. YRL,RMA

## 2019-01-23 ENCOUNTER — Ambulatory Visit: Payer: Self-pay

## 2019-01-23 ENCOUNTER — Telehealth: Payer: Self-pay

## 2019-01-23 DIAGNOSIS — I639 Cerebral infarction, unspecified: Secondary | ICD-10-CM

## 2019-01-23 DIAGNOSIS — I10 Essential (primary) hypertension: Secondary | ICD-10-CM

## 2019-01-23 NOTE — Chronic Care Management (AMB) (Signed)
Chronic Care Management    Social Work Follow Up Note  01/23/2019 Name: [name] MRN: [id number] DOB: [birth year]  [name] is a 83 y.o. year old male who is a primary care patient of [name], [place]. The CCM team was consulted for assistance with [employer].      Third unsuccessful outreach call was placed to the patients caregiver/niece [name]. CCM SW left a HIPAA compliant voice message requesting a return call. CCM SW has closed all SW goals due to an inability to assist with interventions and assess progression of patient goals. CCM SW will re-establish goals if a return call is received.  Goals Addressed            This Visit's Progress   . COMPLETED: "he needs a life alert that works when he is away from home"       Family stated  Current Barriers:  . Limited options for wide range life alert systems . Lacks knowledge of a company who provides life alerts to work outside of the home  [place]):  [name] Over the next 40 days, client will work with SW to address concerns related to obtaining a life alert to meet the patients needs  Interventions: . Unable to assist with progression of patient goal due to an inability to maintain contact  Patient Self Care Activities:  . Calls provider office for new concerns or questions  Please see past updates related to this goal by clicking on the "Past Updates" button in the selected goal      . COMPLETED: "he needs a service to assist with grocery delivery"       Family member/ caregiver stated  Current Barriers:  . Lack of transportation . Concerns of patient in public due to [place]- 19 pandemic . Lacks knowledge of community resources to assist with grocery delivery  Clinical Social Work Clinical Goal(s):  [name] Over the next 30 days, client will follow up with [facility] [place] as directed by SW  Interventions: . Unable to assess for progression of patient goal  due to an inability to maintain contact  Patient Self Care Activities:  . Currently UNABLE TO independently shop for own groceries  Please see past updates related to this goal by clicking on the "Past Updates" button in the selected goal      . COMPLETED: "he needs to work on range of motion and arthitic pain"       Family member/ caregiver stated:  Current Barriers:  . ADL IADL limitations . Social Isolation  . Lacks knowledge of reasons for in home therapy  Clinical Social Work Clinical Goal(s):  [name] Over the next 30 days, client will work with SW to address concerns related to desire to engage in exercise regimen  Interventions: . Unable to assess progression of patient stated goal due to an inability to maintain contact  Patient Self Care Activities:  . Attends all scheduled provider appointments . Calls pharmacy for medication refills . Calls provider office for new concerns or questions  Please see past updates related to this goal by clicking on the "Past Updates" button in the selected goal      . COMPLETED: "I want to learn more about the [facility]ACE program"       Niece stated Current Barriers:  . Limited knowledge of what a PACE program covers  Clinical Social Work Clinical Goal(s):  [name] Over the next 30 days the patients niece  will be able to verbalize more understanding about the [facility] [place] program.  Interventions:  Unable to assist with goal completion due to inability to maintain contact.  Patient Self Care Activities:  . Attends all scheduled provider appointments . Calls pharmacy for medication refills . Calls provider office for new concerns or questions  Please see past updates related to this goal by clicking on the "Past Updates" button in the selected goal          Follow Up Plan: No further follow up planned at this time.  [name], BSW, CDP Social Worker, Certified Dementia Practitioner [facility] Care Management [phone]   Total time spent performing care coordination and/or care management activities with the patient by phone or face to face = 8 minutes.

## 2019-01-26 ENCOUNTER — Telehealth: Payer: Self-pay | Admitting: *Deleted

## 2019-01-26 DIAGNOSIS — I739 Peripheral vascular disease, unspecified: Secondary | ICD-10-CM

## 2019-01-26 DIAGNOSIS — M25473 Effusion, unspecified ankle: Secondary | ICD-10-CM

## 2019-01-26 NOTE — Progress Notes (Signed)
Subjective: 83 y.o. returns the office today for concerns of ankle swelling on the right side worse than left.  He points on the lateral aspect.  This is been ongoing for last 2 to 3 weeks and was a gradual onset.  He states that he does not have any significant pain is more uncomfortable when it swells.  No redness or warmth.  No injury.  He also has painful, elongated, thickened toenails which they cannot trim themself and calluses he would like to have trimmed. Denies any redness or drainage around the nails. Denies any acute changes since last appointment and no new complaints today. Denies any systemic complaints such as fevers, chills, nausea, vomiting.   PCP: [name], FNP  Objective: AAO 3, NAD DP/PT pulses deceased Nails hypertrophic, dystrophic, elongated, brittle, discolored 10. There is tenderness overlying the nails 1-5 bilaterally. There is no surrounding erythema or drainage along the nail sites. Hyperkeratotic lesion submetatarsal area on the right foot.  Upon debridement there is no ongoing ulceration drainage or any signs of infection. Swelling present to bilateral ankles with the right side worse than the left.  He does get cramps to his leg as well.  There is no pain with calf compression, erythema or warmth.  There is no area pinpoint tenderness or pain to vibratory sensation.  Mild discomfort subjectively on the arch of the foot on the plantar fascia.  Plantar fascial peers are intact.  Achilles tendon intact.  Unable to elicit any significant discomfort today.  There is no erythema or warmth associated with swelling. No open lesions or pre-ulcerative lesions are identified. No other areas of tenderness bilateral lower extremities. No overlying edema, erythema, increased warmth.  Assessment: Patient presents with symptomatic onychomycosis, hyperkeratotic lesions; Swelling, PAD  Plan: -Treatment options including alternatives, risks, complications were discussed -Nails  sharply debrided 10 without complication/bleeding. -Hyperkeratotic lesion sharply debrided x2 without any complications or bleeding. -ABI was performed in the office today which was abnormal.  Will order arterial duplex.  Discussed elevation.  Consider compression. I ordered Voltaren gel as well to help with the discomfort to use prn. Stretching exercises daily.  -Discussed daily foot inspection. If there are any changes, to call the office immediately.  -Follow-up in 3 months or sooner if any problems are to arise. In the meantime, encouraged to call the office with any questions, concerns, changes symptoms.  Celesta Gentile, DPM

## 2019-01-26 NOTE — Telephone Encounter (Signed)
-----   Message from Trula Slade, DPM sent at 01/26/2019  7:15 AM EDT ----- Can you please order an arterial duplex? Thanks.

## 2019-01-26 NOTE — Telephone Encounter (Signed)
Faxed orders to [facility]. 

## 2019-01-28 ENCOUNTER — Ambulatory Visit: Payer: Medicare Other | Admitting: Internal Medicine

## 2019-01-29 ENCOUNTER — Telehealth: Payer: Self-pay

## 2019-01-29 ENCOUNTER — Ambulatory Visit: Payer: Medicare Other | Admitting: Nurse Practitioner

## 2019-01-30 ENCOUNTER — Ambulatory Visit: Payer: Self-pay

## 2019-01-30 NOTE — Progress Notes (Signed)
  Chronic Care Management   Outreach Note  01/30/2019 Name: [name] MRN: [id number] DOB: 02-Jun-1936  Referred by: [name], FNP Reason for referral : Chronic Care Management   An unsuccessful telephone outreach was attempted today. The patient was referred to the case management team by for assistance with chronic care management and care coordination.   Follow Up Plan: The care management team will reach out to the patient again over the next 5-7 business days.   Regina Eck, PharmD, BCPS Clinical Pharmacist, [facility] [place]: [phone]

## 2019-01-31 ENCOUNTER — Other Ambulatory Visit: Payer: Self-pay | Admitting: Internal Medicine

## 2019-01-31 ENCOUNTER — Other Ambulatory Visit: Payer: Self-pay | Admitting: Nurse Practitioner

## 2019-02-02 ENCOUNTER — Other Ambulatory Visit: Payer: Self-pay | Admitting: Internal Medicine

## 2019-02-02 ENCOUNTER — Other Ambulatory Visit: Payer: Self-pay | Admitting: Nurse Practitioner

## 2019-02-03 ENCOUNTER — Ambulatory Visit (INDEPENDENT_AMBULATORY_CARE_PROVIDER_SITE_OTHER): Payer: Medicare Other

## 2019-02-03 DIAGNOSIS — I639 Cerebral infarction, unspecified: Secondary | ICD-10-CM

## 2019-02-03 DIAGNOSIS — R7303 Prediabetes: Secondary | ICD-10-CM

## 2019-02-03 DIAGNOSIS — I10 Essential (primary) hypertension: Secondary | ICD-10-CM

## 2019-02-04 ENCOUNTER — Telehealth: Payer: Self-pay | Admitting: Nurse Practitioner

## 2019-02-04 NOTE — Telephone Encounter (Signed)
I called the patient to schedule earlier AWV with Pamala Hurry [facility] calendar year). I spoke with his niece who said that she will call back once she decides if he needs to be seen in office or virtually due to other issues that he's experiencing. VDM (DD)

## 2019-02-05 ENCOUNTER — Telehealth: Payer: Self-pay | Admitting: Pharmacist

## 2019-02-05 ENCOUNTER — Other Ambulatory Visit: Payer: Self-pay | Admitting: Nurse Practitioner

## 2019-02-05 DIAGNOSIS — I639 Cerebral infarction, unspecified: Secondary | ICD-10-CM

## 2019-02-05 DIAGNOSIS — R269 Unspecified abnormalities of gait and mobility: Secondary | ICD-10-CM

## 2019-02-05 NOTE — Chronic Care Management (AMB) (Addendum)
Chronic Care Management   Follow Up Note   02/04/2019 Name: [name] MRN: [id number] DOB: 01-14-1936  Referred by: [name], FNP Reason for referral : Chronic Care Management (CCM RNCM Telephone Follow up )   [name] is a 83 y.o. year old male who is a primary care patient of [name], [place]. The CCM team was consulted for assistance with chronic disease management and care coordination needs.    Review of patient status, including review of consultants reports, relevant laboratory and other test results, and collaboration with appropriate care team members and the patient's provider was performed as part of comprehensive patient evaluation and provision of chronic care management services.    I spoke with Mr. Kott's niece [name] by telephone today for a CCM follow up.   Goals Addressed      Patient Stated   . COMPLETED: "Better options for medication packaging s/p CVA" (pt-stated)       Caregiver/Family member stated  Current Barriers:  . ADL IADL limitations - right sided weakness from CVA, difficulty opening bottles . Lacks knowledge of pill packaging options  Clinical Social Work Clinical Goal(s):  [name] Over the next 14 days, patient will work with CCM Pharmacist to address needs related to medication accessibility - Goal Met . Over the next 30 days, patient with work with CCM team to increase medication adherence with improved modifications/adjustments to medication organization strategy. - Goal Met   CCM RN CM Interventions: 02/04/19 call completed with niece [name]  . Discussed patient/niece have no affordability issues. No adverse events. No changes to medications. . Assessed for ongoing adherence to patient taking his medications exactly as prescribed - niece states Mr. Candy has no cognitive or memory issues and is able to self administer his medications as prescribed - niece helps with refills when supply is getting low and ensures he is  not running out his medications  Patient Self Care Activities:  . Performs ADL's independently . Performs IADL's independently . Calls provider office for new concerns or questions  Please see past updates related to this goal by clicking on the "Past Updates" button in the selected goal       Other   . "He is having pain and swelling to his right ankle"       Niece stated  Current Barriers:  [name] Knowledge Deficits related to diagnosis and treatment for right ankle pain and swelling   Nurse Case Manager Clinical Goal(s):  [name] Over the next 30 days, patient/niece [name] will verbalize understanding of plan for diagnosis and treatment management for right ankle pain and swelling. . Over the next 30 days, patient will have no mobility issues related to pain or swelling to his right ankle.  CCM RN CM Interventions:  02/04/19 call completed with niece [name]  . Evaluation of current treatment plan related to right ankle pain and swelling and patient's adherence to plan as established by provider. . Collaborated with [name], FNP  regarding symptoms of intermittent pain and swelling to patient's right ankle; updated on recent OV with Dr. Earleen Newport with [facility]; updated provider that this physician took xrays and assessed for vascular involvement; advised a referral was sent for Vascular studies to be completed . Discussed plans with patient for ongoing care management follow up and provided patient with direct contact information for care management team . Reviewed scheduled/upcoming provider appointments including: niece [name] plans to call the [place] office to schedule an  in office visit for patient to f/u with [name], FNP  Patient Self Care Activities:  . Self administers medications as prescribed . Attends all scheduled provider appointments . Performs ADL's independently . Performs IADL's independently  Initial goal documentation     . "He needs to work on his ROM  and dexterity"       Niece stated  Current Barriers:  . Impaired Physical Mobility  . Impaired dexterity functionality  Nurse Case Manager Clinical Goal(s):  [name] Over the next 30 days, patient/niece will verbalize understanding of plan for in home PT/OT for HSE and therapy services to help with balance, strengthening and help improve dexterity. Goal Not Met  . 02/04/19 - Over the next 30 days, patient will have in home PT/OT services in place.   CCM RN CM Interventions:  02/04/19 call completed with niece [name]  . Evaluation of current treatment plan related to balance and mobility and patient's adherence to plan as established by provider  Assessed for home safety concerns, assessed for current DME use - pt continues to use his 4 legged cane for ambulation, he has a scooter for outside use - niece denies patient having any falls or near falls  Discussed that niece's preferred provider does not provide in home PT/OT services  Discussed patient continues to need these service and niece is open to using any provider available   Collaborated with provider [name] regarding follow up on referral request for in home PT/OT . Discussed plans with patient for ongoing care management follow up and provided patient with direct contact information for care management team . Provided niece with RN CM contact # and hours of availability  Patient Self Care Activities:  . Attends all scheduled provider appointments . Calls pharmacy for medication refills . Calls provider office for new concerns or questions  Please see past updates related to this goal by clicking on the "Past Updates" button in the selected goal     . "It would be good for him to learn what foods to eat"       Niece stated Current Barriers:  [name] Knowledge Deficits related to Diabetes disease process and Self Health Management   Nurse Case Manager Clinical Goal(s):  [name] Over the next 90 days, patient will work with the  CCM team  to address needs related to Diabetes disease education and nutritional recommendations.   02/04/19 - target goal date re-established to 90 days due to COVID-19 treatment delays  CCM RN CM Interventions:  02/04/19 call completed with niece [name]  . Evaluation of current treatment plan related to prediabetes and patient's adherence to plan as established by provider. . Reviewed and discussed patient's most recent A1C of 5.9 (8 mo ago); discussed desired nondiabetic A1C range . Mailed printed educational materials related to Diabetes meal planning, Know your A1C and Diabetes disease process; What Can I Eat - mailed to niece's home address provided - niece did not receive materials previously mailed to patient's home  . Provided niece with RN CM contact # and hours of availability . Discussed plans with patient for ongoing care management follow up and provided patient with direct contact information for care management team  Patient Self Care Activities:  . Attends all scheduled provider appointments . Calls pharmacy for medication refills . Calls provider office for new concerns or questions  Please see past updates related to this goal by clicking on the "Past Updates" button in the selected goal     . "  Keigen has more bruising than usual"       Niece stated Current Barriers:  [name] Knowledge Deficits related to etiology for increased bruising  . Dual anticoagulation therapy   Nurse Case Manager Clinical Goal(s):  [name] Over the next 30 days, patient will be evaluated by PCP for potential cause for increased bruising and will verbalize understanding of plan for diagnosis and treatment of this condition.   CCM RN CM Interventions:  02/04/19 call completed with niece [name]   . Evaluation of current treatment plan related to increased bruising and patient's adherence to plan as established by provider. . Advised patient to confirm with patient that he is not taking extra doses  of ASA; discussed importance of ensuring patient is taking his Plavix exactly as prescribed . Provided education to patient re: potential SE of taking dual anticoagulation therapies including increased bruising; Assessed for other s/s of bleeding including nose bleeds, gums bleeding when performing oral hygiene, noted blood in vomit or stool - niece denies ; Assessed for other potential cause for increased bleeding such as falls or impaired balance causing patient to fall into doorways, etc. - niece denies - niece reports the patient did report to her that "he bumped his arm on the car door while getting in" . Reviewed medications with patient and discussed dual anticoagulants including dosage and frequency . Collaborated with [name], FNP regarding reported symptoms of increased bruising . Discussed plans with patient for ongoing care management follow up and provided patient with direct contact information for care management team . Reviewed scheduled/upcoming provider appointments including: niece [name] will call the [place] office to schedule a face to face OV with provider [name], FNP  Patient Self Care Activities:  . Self administers medications as prescribed . Attends all scheduled provider appointments . Performs ADL's independently . Performs IADL's independently  Initial goal documentation    . COMPLETED: Assist with Disease Management and Care Coordination Needs       Current Barriers:  [name] Knowledge Barriers related to resources and support available to address needs related to arthritic pain, medication management and community resources  Case Manager Clinical Goal(s):  [name] Over the next 30 days, patient will work with the CCM team to address needs related to resources to help with medication management, transportation, home safety and home delivered meals/groceries - Goal Met  Interventions:  . Collaborated with BSW and initiated plan of care to address needs related to  patient specified disease management, community resources and care coordination  Patient Self Care Activities:  . Calls provider office for new concerns or questions  Please see past updates related to this goal by clicking on the "Past Updates" button in the selected goal        Telephone follow up appointment with care management team member scheduled for: 02/23/19   Barb Merino, RN, BSN, CCM Care Management Coordinator [facility]  Direct Phone: [phone]

## 2019-02-05 NOTE — Patient Instructions (Signed)
Visit Information  Goals Addressed      Patient Stated   . COMPLETED: "Better options for medication packaging s/p CVA" (pt-stated)       Caregiver/Family member stated  Current Barriers:  . ADL IADL limitations - right sided weakness from CVA, difficulty opening bottles . Lacks knowledge of pill packaging options  Clinical Social Work Clinical Goal(s):  [name] Over the next 14 days, patient will work with CCM Pharmacist to address needs related to medication accessibility - Goal Met . Over the next 30 days, patient with work with CCM team to increase medication adherence with improved modifications/adjustments to medication organization strategy. - Goal Met   CCM RN CM Interventions: 02/04/19 call completed with niece [name]  . Discussed patient/niece have no affordability issues. No adverse events. No changes to medications. . Assessed for ongoing adherence to patient taking his medications exactly as prescribed - niece states Mr. Thomann has no cognitive or memory issues and is able to self administer his medications as prescribed - niece helps with refills when supply is getting low and ensures he is not running out his medications  Patient Self Care Activities:  . Performs ADL's independently . Performs IADL's independently . Calls provider office for new concerns or questions  Please see past updates related to this goal by clicking on the "Past Updates" button in the selected goal       Other   . "He is having pain and swelling to his right ankle"       Niece stated  Current Barriers:  [name] Knowledge Deficits related to diagnosis and treatment for right ankle pain and swelling   Nurse Case Manager Clinical Goal(s):  [name] Over the next 30 days, patient/niece [name] will verbalize understanding of plan for diagnosis and treatment management for right ankle pain and swelling. . Over the next 30 days, patient will have no mobility issues related to pain or swelling to his right  ankle.  CCM RN CM Interventions:  02/04/19 call completed with niece [name]  . Evaluation of current treatment plan related to right ankle pain and swelling and patient's adherence to plan as established by provider. . Collaborated with [name], FNP  regarding symptoms of intermittent pain and swelling to patient's right ankle; updated on recent OV with Dr. Earleen Newport with [place]; updated provider that this physician took xrays and assessed for vascular involvement; advised a referral was sent for Vascular studies to be completed . Discussed plans with patient for ongoing care management follow up and provided patient with direct contact information for care management team . Reviewed scheduled/upcoming provider appointments including: niece [name] plans to call the [place] office to schedule an in office visit for patient to f/u with [name], FNP  Patient Self Care Activities:  . Self administers medications as prescribed . Attends all scheduled provider appointments . Performs ADL's independently . Performs IADL's independently  Initial goal documentation     . "He needs to work on his ROM and dexterity"       Niece stated  Current Barriers:  . Impaired Physical Mobility  . Impaired dexterity functionality  Nurse Case Manager Clinical Goal(s):  [name] Over the next 30 days, patient/niece will verbalize understanding of plan for in home PT/OT for HSE and therapy services to help with balance, strengthening and help improve dexterity. Goal Not Met  . 02/04/19 - Over the next 30 days, patient will have in home PT/OT services in place.   CCM  RN CM Interventions:  02/04/19 call completed with niece [name]  . Evaluation of current treatment plan related to balance and mobility and patient's adherence to plan as established by provider  Assessed for home safety concerns, assessed for current DME use - pt continues to use his 4 legged cane for ambulation, he has a  scooter for outside use - niece denies patient having any falls or near falls  Discussed that niece's preferred provider does not provide in home PT/OT services  Discussed patient continues to need these service and niece is open to using any provider available   Collaborated with provider [name] regarding follow up on referral request for in home PT/OT . Discussed plans with patient for ongoing care management follow up and provided patient with direct contact information for care management team . Provided niece with RN CM contact # and hours of availability  Patient Self Care Activities:  . Attends all scheduled provider appointments . Calls pharmacy for medication refills . Calls provider office for new concerns or questions  Please see past updates related to this goal by clicking on the "Past Updates" button in the selected goal     . "It would be good for him to learn what foods to eat"       Niece stated Current Barriers:  [name] Knowledge Deficits related to Diabetes disease process and Self Health Management   Nurse Case Manager Clinical Goal(s):  [name] Over the next 90 days, patient will work with the CCM team  to address needs related to Diabetes disease education and nutritional recommendations.   02/04/19 - target goal date re-established to 90 days due to COVID-19 treatment delays  CCM RN CM Interventions:  02/04/19 call completed with niece [name]  . Evaluation of current treatment plan related to prediabetes and patient's adherence to plan as established by provider. . Reviewed and discussed patient's most recent A1C of 5.9 (8 mo ago); discussed desired nondiabetic A1C range . Mailed printed educational materials related to Diabetes meal planning, Know your A1C and Diabetes disease process; What Can I Eat - mailed to niece's home address provided - niece did not receive materials previously mailed to patient's home  . Provided niece with RN CM contact # and hours  of availability . Discussed plans with patient for ongoing care management follow up and provided patient with direct contact information for care management team  Patient Self Care Activities:  . Attends all scheduled provider appointments . Calls pharmacy for medication refills . Calls provider office for new concerns or questions  Please see past updates related to this goal by clicking on the "Past Updates" button in the selected goal     . "Avyn has more bruising than usual"       Niece stated Current Barriers:  [name] Knowledge Deficits related to etiology for increased bruising  . Dual anticoagulation therapy   Nurse Case Manager Clinical Goal(s):  [name] Over the next 30 days, patient will be evaluated by PCP for potential cause for increased bruising and will verbalize understanding of plan for diagnosis and treatment of this condition.   CCM RN CM Interventions:  02/04/19 call completed with niece [name]   . Evaluation of current treatment plan related to increased bruising and patient's adherence to plan as established by provider. . Advised patient to confirm with patient that he is not taking extra doses of ASA; discussed importance of ensuring patient is taking his Plavix exactly as prescribed . Provided  education to patient re: potential SE of taking dual anticoagulation therapies including increased bruising; Assessed for other s/s of bleeding including nose bleeds, gums bleeding when performing oral hygiene, noted blood in vomit or stool - niece denies ; Assessed for other potential cause for increased bleeding such as falls or impaired balance causing patient to fall into doorways, etc. - niece denies - niece reports the patient did report to her that "he bumped his arm on the car door while getting in" . Reviewed medications with patient and discussed dual anticoagulants including dosage and frequency . Collaborated with [name], FNP regarding reported symptoms of increased  bruising . Discussed plans with patient for ongoing care management follow up and provided patient with direct contact information for care management team . Reviewed scheduled/upcoming provider appointments including: niece [name] will call the [place] office to schedule a face to face OV with provider [name], FNP  Patient Self Care Activities:  . Self administers medications as prescribed . Attends all scheduled provider appointments . Performs ADL's independently . Performs IADL's independently  Initial goal documentation     . COMPLETED: Assist with Disease Management and Care Coordination Needs       Current Barriers:  [name] Knowledge Barriers related to resources and support available to address needs related to arthritic pain, medication management and community resources  Case Manager Clinical Goal(s):  [name] Over the next 30 days, patient will work with the CCM team to address needs related to resources to help with medication management, transportation, home safety and home delivered meals/groceries - Goal Met  Interventions:  . Collaborated with BSW and initiated plan of care to address needs related to patient specified disease management, community resources and care coordination  Patient Self Care Activities:  . Calls provider office for new concerns or questions  Please see past updates related to this goal by clicking on the "Past Updates" button in the selected goal        The patient verbalized understanding of instructions provided today and declined a print copy of patient instruction materials.   Telephone follow up appointment with care management team member scheduled for: 02/23/19  Barb Merino, RN, BSN, CCM Care Management Coordinator [facility]  Direct Phone: [phone]

## 2019-02-05 NOTE — Progress Notes (Unsigned)
  Chronic Care Management   Outreach Note  02/05/2019 Name: [name] MRN: [id number] DOB: May 05, 1936  Referred by: [name], FNP Reason for referral : No chief complaint on file.   {CCM OUTREACH ATTEMPTS:22247}  Follow Up Plan: {CCM FOLLOW UP PLAN:22241}  SIGNATURE

## 2019-02-10 ENCOUNTER — Other Ambulatory Visit: Payer: Self-pay | Admitting: Nurse Practitioner

## 2019-02-10 MED ORDER — LISINOPRIL-HYDROCHLOROTHIAZIDE 10-12.5 MG PO TABS: 1.0000 | ORAL_TABLET | Freq: Every day | ORAL | 1 refills | Status: DC

## 2019-02-10 MED ORDER — CLOPIDOGREL BISULFATE 75 MG PO TABS: 75.0000 mg | ORAL_TABLET | Freq: Every day | ORAL | 1 refills | Status: DC

## 2019-02-17 ENCOUNTER — Ambulatory Visit: Payer: Medicare Other | Admitting: Nurse Practitioner

## 2019-02-17 ENCOUNTER — Ambulatory Visit (INDEPENDENT_AMBULATORY_CARE_PROVIDER_SITE_OTHER): Payer: Medicare Other

## 2019-02-17 ENCOUNTER — Other Ambulatory Visit: Payer: Self-pay

## 2019-02-17 ENCOUNTER — Encounter: Payer: Self-pay | Admitting: Nurse Practitioner

## 2019-02-17 VITALS — BP 118/60 | HR 73 | Temp 97.7°F | Ht 69.0 in | Wt 167.4 lb

## 2019-02-17 DIAGNOSIS — Z0000 Encounter for general adult medical examination without abnormal findings: Secondary | ICD-10-CM

## 2019-02-17 DIAGNOSIS — I69351 Hemiplegia and hemiparesis following cerebral infarction affecting right dominant side: Secondary | ICD-10-CM

## 2019-02-17 DIAGNOSIS — M25471 Effusion, right ankle: Secondary | ICD-10-CM | POA: Diagnosis not present

## 2019-02-17 DIAGNOSIS — G8191 Hemiplegia, unspecified affecting right dominant side: Secondary | ICD-10-CM

## 2019-02-17 DIAGNOSIS — R7303 Prediabetes: Secondary | ICD-10-CM

## 2019-02-17 DIAGNOSIS — I639 Cerebral infarction, unspecified: Secondary | ICD-10-CM

## 2019-02-17 DIAGNOSIS — E782 Mixed hyperlipidemia: Secondary | ICD-10-CM

## 2019-02-17 DIAGNOSIS — I10 Essential (primary) hypertension: Secondary | ICD-10-CM | POA: Diagnosis not present

## 2019-02-17 NOTE — Patient Instructions (Signed)
[name] , Thank you for taking time to come for your Medicare Wellness Visit. I appreciate your ongoing commitment to your health goals. Please review the following plan we discussed and let me know if I can assist you in the future.   Screening recommendations/referrals: Colonoscopy: 04/2017 Recommended yearly ophthalmology/optometry visit for glaucoma screening and checkup Recommended yearly dental visit for hygiene and checkup  Vaccinations: Influenza vaccine: 03/2018 Pneumococcal vaccine: 09/2017 Tdap vaccine: 06/2013 Shingles vaccine: discussed    Advanced directives: Please bring a copy of your POA (Power of [place]) and/or Living Will to your next appointment.    Conditions/risks identified: stroke   Next appointment: 08/20/2019 at 10:45  Preventive Care 81 Years and Older, Male Preventive care refers to lifestyle choices and visits with your health care provider that can promote health and wellness. What does preventive care include?  A yearly physical exam. This is also called an annual well check.  Dental exams once or twice a year.  Routine eye exams. Ask your health care provider how often you should have your eyes checked.  Personal lifestyle choices, including:  Daily care of your teeth and gums.  Regular physical activity.  Eating a healthy diet.  Avoiding tobacco and drug use.  Limiting alcohol use.  Practicing safe sex.  Taking low doses of aspirin every day.  Taking vitamin and mineral supplements as recommended by your health care provider. What happens during an annual well check? The services and screenings done by your health care provider during your annual well check will depend on your age, overall health, lifestyle risk factors, and family history of disease. Counseling  Your health care provider may ask you questions about your:  Alcohol use.  Tobacco use.  Drug use.  Emotional well-being.  Home and relationship well-being.   Sexual activity.  Eating habits.  History of falls.  Memory and ability to understand (cognition).  Work and work Statistician. Screening  You may have the following tests or measurements:  Height, weight, and BMI.  Blood pressure.  Lipid and cholesterol levels. These may be checked every 5 years, or more frequently if you are over 17 years old.  Skin check.  Lung cancer screening. You may have this screening every year starting at age 9 if you have a 30-pack-year history of smoking and currently smoke or have quit within the past 15 years.  Fecal occult blood test (FOBT) of the stool. You may have this test every year starting at age 27.  Flexible sigmoidoscopy or colonoscopy. You may have a sigmoidoscopy every 5 years or a colonoscopy every 10 years starting at age 42.  Prostate cancer screening. Recommendations will vary depending on your family history and other risks.  Hepatitis C blood test.  Hepatitis B blood test.  Sexually transmitted disease (STD) testing.  Diabetes screening. This is done by checking your blood sugar (glucose) after you have not eaten for a while (fasting). You may have this done every 1-3 years.  Abdominal aortic aneurysm (AAA) screening. You may need this if you are a current or former smoker.  Osteoporosis. You may be screened starting at age 102 if you are at high risk. Talk with your health care provider about your test results, treatment options, and if necessary, the need for more tests. Vaccines  Your health care provider may recommend certain vaccines, such as:  Influenza vaccine. This is recommended every year.  Tetanus, diphtheria, and acellular pertussis (Tdap, Td) vaccine. You may need a Td booster  every 10 years.  Zoster vaccine. You may need this after age 15.  Pneumococcal 13-valent conjugate (PCV13) vaccine. One dose is recommended after age 105.  Pneumococcal polysaccharide (PPSV23) vaccine. One dose is recommended after  age 44. Talk to your health care provider about which screenings and vaccines you need and how often you need them. This information is not intended to replace advice given to you by your health care provider. Make sure you discuss any questions you have with your health care provider. Document Released: 08/19/2015 Document Revised: 04/11/2016 Document Reviewed: 05/24/2015 Elsevier Interactive Patient Education  2017 [place] Prevention in the Home Falls can cause injuries. They can happen to people of all ages. There are many things you can do to make your home safe and to help prevent falls. What can I do on the outside of my home?  Regularly fix the edges of walkways and driveways and fix any cracks.  Remove anything that might make you trip as you walk through a door, such as a raised step or threshold.  Trim any bushes or trees on the path to your home.  Use bright outdoor lighting.  Clear any walking paths of anything that might make someone trip, such as rocks or tools.  Regularly check to see if handrails are loose or broken. Make sure that both sides of any steps have handrails.  Any raised decks and porches should have guardrails on the edges.  Have any leaves, snow, or ice cleared regularly.  Use sand or salt on walking paths during winter.  Clean up any spills in your garage right away. This includes oil or grease spills. What can I do in the bathroom?  Use night lights.  Install grab bars by the toilet and in the tub and shower. Do not use towel bars as grab bars.  Use non-skid mats or decals in the tub or shower.  If you need to sit down in the shower, use a plastic, non-slip stool.  Keep the floor dry. Clean up any water that spills on the floor as soon as it happens.  Remove soap buildup in the tub or shower regularly.  Attach bath mats securely with double-sided non-slip rug tape.  Do not have throw rugs and other things on the floor that can  make you trip. What can I do in the bedroom?  Use night lights.  Make sure that you have a light by your bed that is easy to reach.  Do not use any sheets or blankets that are too big for your bed. They should not hang down onto the floor.  Have a firm chair that has side arms. You can use this for support while you get dressed.  Do not have throw rugs and other things on the floor that can make you trip. What can I do in the kitchen?  Clean up any spills right away.  Avoid walking on wet floors.  Keep items that you use a lot in easy-to-reach places.  If you need to reach something above you, use a strong step stool that has a grab bar.  Keep electrical cords out of the way.  Do not use floor polish or wax that makes floors slippery. If you must use wax, use non-skid floor wax.  Do not have throw rugs and other things on the floor that can make you trip. What can I do with my stairs?  Do not leave any items on the stairs.  Make  sure that there are handrails on both sides of the stairs and use them. Fix handrails that are broken or loose. Make sure that handrails are as long as the stairways.  Check any carpeting to make sure that it is firmly attached to the stairs. Fix any carpet that is loose or worn.  Avoid having throw rugs at the top or bottom of the stairs. If you do have throw rugs, attach them to the floor with carpet tape.  Make sure that you have a light switch at the top of the stairs and the bottom of the stairs. If you do not have them, ask someone to add them for you. What else can I do to help prevent falls?  Wear shoes that:  Do not have high heels.  Have rubber bottoms.  Are comfortable and fit you well.  Are closed at the toe. Do not wear sandals.  If you use a stepladder:  Make sure that it is fully opened. Do not climb a closed stepladder.  Make sure that both sides of the stepladder are locked into place.  Ask someone to hold it for you,  if possible.  Clearly mark and make sure that you can see:  Any grab bars or handrails.  First and last steps.  Where the edge of each step is.  Use tools that help you move around (mobility aids) if they are needed. These include:  Canes.  Walkers.  Scooters.  Crutches.  Turn on the lights when you go into a dark area. Replace any light bulbs as soon as they burn out.  Set up your furniture so you have a clear path. Avoid moving your furniture around.  If any of your floors are uneven, fix them.  If there are any pets around you, be aware of where they are.  Review your medicines with your doctor. Some medicines can make you feel dizzy. This can increase your chance of falling. Ask your doctor what other things that you can do to help prevent falls. This information is not intended to replace advice given to you by your health care provider. Make sure you discuss any questions you have with your health care provider. Document Released: 05/19/2009 Document Revised: 12/29/2015 Document Reviewed: 08/27/2014 Elsevier Interactive Patient Education  2017 [employer].

## 2019-02-17 NOTE — Progress Notes (Signed)
Subjective:   [name] is a 83 y.o. male who presents for Medicare Annual/Subsequent preventive examination.  Review of Systems:  n/a Cardiac Risk Factors include: advanced age (>29mn, >>16women);hypertension;male gender;sedentary lifestyle     Objective:    Vitals: BP 118/60 (BP Location: Left Arm, Patient Position: Sitting, Cuff Size: Normal)   Pulse 73   Temp 97.7 F (36.5 C) (Oral)   Ht '5\' 9"'$  (1.753 m)   Wt 167 lb 6.4 oz (75.9 kg)   BMI 24.72 kg/m   Body mass index is 24.72 kg/m.  Advanced Directives 02/17/2019 05/15/2018  Does Patient Have a Medical Advance Directive? Yes Yes  Type of Advance Directive Healthcare [place] of [place]? No - copy requested No - copy requested    Tobacco Social History   Tobacco Use  Smoking Status Never Smoker  Smokeless Tobacco Never Used     Counseling given: Not Answered   Clinical Intake:  Pre-visit preparation completed: Yes  Pain : No/denies pain     Nutritional Status: BMI of 19-24  Normal Nutritional Risks: None Diabetes: No  How often do you need to have someone help you when you read instructions, pamphlets, or other written materials from your doctor or pharmacy?: 4 - Often What is the last grade level you completed in school?: 7th grade  Interpreter Needed?: No  Information entered by :: NAllen LPN  Past Medical History:  Diagnosis Date  . Hyperlipidemia 2001  . Hypertension 2001  . Stroke ([facility]    History reviewed. No pertinent surgical history. Family History  Problem Relation Age of Onset  . Stroke Mother   . Stroke Father   . Heart attack Father   . Cancer Brother    Social History   Socioeconomic History  . Marital status: Single    Spouse name: Not on file  . Number of children: Not on file  . Years of education: Not on file  . Highest education level: Not on file  Occupational History  . Occupation:  retired  SScientific laboratory technician . Financial resource strain: Not hard at all  . Food insecurity    Worry: Never true    Inability: Never true  . Transportation needs    Medical: No    Non-medical: No  Tobacco Use  . Smoking status: Never Smoker  . Smokeless tobacco: Never Used  Substance and Sexual Activity  . Alcohol use: Not Currently  . Drug use: Never  . Sexual activity: Not Currently  Lifestyle  . Physical activity    Days per week: 0 days    Minutes per session: 0 min  . Stress: Not at all  Relationships  . Social [place] phone: Not on file    Gets together: Not on file    Attends religious service: Not on file    Active member of club or organization: Not on file    Attends meetings of clubs or organizations: Not on file    Relationship status: Not on file  Other Topics Concern  . Not on file  Social History Narrative  . Not on file    Outpatient Encounter Medications as of 02/17/2019  Medication Sig  . aspirin EC 81 MG tablet Take 81 mg by mouth daily.  . cetirizine (ZYRTEC) 10 MG tablet Take 10 mg by mouth as needed for allergies.  .[name] Kitchenclopidogrel (PLAVIX) 75 MG tablet Take  1 tablet (75 mg total) by mouth daily.  [name] diltiazem (CARDIZEM CD) 180 MG 24 hr capsule TAKE ONE CAPSULE BY MOUTH EVERY DAY  . lisinopril-hydrochlorothiazide (ZESTORETIC) 10-12.5 MG tablet Take 1 tablet by mouth daily.  . rosuvastatin (CRESTOR) 10 MG tablet TAKE 1 TABLET BY MOUTH EVERY DAY  . diclofenac sodium (VOLTAREN) 1 % GEL Apply 2 g topically 4 (four) times daily. Rub into affected area of foot 2 to 4 times daily (Patient not taking: Reported on 02/17/2019)  . diltiazem (TIAZAC) 180 MG 24 hr capsule Take 180 mg by mouth daily.   No facility-administered encounter medications on file as of 02/17/2019.     Activities of Daily Living In your present state of health, do you have any difficulty performing the following activities: 02/17/2019 05/15/2018  Hearing? Y Y  Comment sometimes  wears hearing aides Has hearing aides, but can not get use to them  Vision? N N  Difficulty concentrating or making decisions? N N  Walking or climbing stairs? Y Y  Comment - Uses cane  Dressing or bathing? N N  Doing errands, shopping? Y N  Comment always has someone with -  Preparing Food and eating ? N N  Using the Toilet? N N  In the past six months, have you accidently leaked urine? N N  Do you have problems with loss of bowel control? Y N  Comment sometimes -  Managing your Medications? N N  Managing your Finances? N N  Housekeeping or managing your Housekeeping? N N  Some recent data might be hidden    Patient Care Team: [name], FNP as PCP - General ([place]) Monna Fam, MD as Consulting Physician (Ophthalmology) [facility], [place], MD as Attending Physician [place], Royce Macadamia, [facility] (Pharmacist) Rex Kras Claudette Stapler, RN as Case Manager Daneen Schick as Social Worker Carol Ada, MD as Consulting Physician (Gastroenterology)   Assessment:   This is a routine wellness examination for [name].  Exercise Activities and Dietary recommendations Current Exercise Habits: The patient does not participate in regular exercise at present  Goals    . "He is having pain and swelling to his right ankle"     Niece stated  Current Barriers:  [name] Knowledge Deficits related to diagnosis and treatment for right ankle pain and swelling   Nurse Case Manager Clinical Goal(s):  [name] Over the next 30 days, patient/niece [name] will verbalize understanding of plan for diagnosis and treatment management for right ankle pain and swelling. . Over the next 30 days, patient will have no mobility issues related to pain or swelling to his right ankle.  CCM RN CM Interventions:  02/04/19 call completed with niece [name]  . Evaluation of current treatment plan related to right ankle pain and swelling and patient's adherence to plan as established by provider. .  Collaborated with [name], FNP  regarding symptoms of intermittent pain and swelling to patient's right ankle; updated on recent OV with Dr. Earleen Newport with [facility]; updated provider that this physician took xrays and assessed for vascular involvement; advised a referral was sent for Vascular studies to be completed . Discussed plans with patient for ongoing care management follow up and provided patient with direct contact information for care management team . Reviewed scheduled/upcoming provider appointments including: niece [name] plans to call the [place] office to schedule an in office visit for patient to f/u with [name], FNP  Patient Self Care Activities:  . Self administers medications as prescribed .  Attends all scheduled provider appointments . Performs ADL's independently . Performs IADL's independently  Initial goal documentation     . "He needs to work on his ROM and dexterity"     Niece stated  Current Barriers:  . Impaired Physical Mobility  . Impaired dexterity functionality  Nurse Case Manager Clinical Goal(s):  [name] Over the next 30 days, patient/niece will verbalize understanding of plan for in home PT/OT for HSE and therapy services to help with balance, strengthening and help improve dexterity. Goal Not Met  . 02/04/19 - Over the next 30 days, patient will have in home PT/OT services in place.   CCM RN CM Interventions:  02/04/19 call completed with niece [name]  . Evaluation of current treatment plan related to balance and mobility and patient's adherence to plan as established by provider  Assessed for home safety concerns, assessed for current DME use - pt continues to use his 4 legged cane for ambulation, he has a scooter for outside use - niece denies patient having any falls or near falls  Discussed that niece's preferred provider does not provide in home PT/OT services  Discussed patient continues to need these service and niece is open to  using any provider available   Collaborated with provider [name] regarding follow up on referral request for in home PT/OT . Discussed plans with patient for ongoing care management follow up and provided patient with direct contact information for care management team . Provided niece with RN CM contact # and hours of availability  Patient Self Care Activities:  . Attends all scheduled provider appointments . Calls pharmacy for medication refills . Calls provider office for new concerns or questions  Please see past updates related to this goal by clicking on the "Past Updates" button in the selected goal       . "It would be good for him to learn what foods to eat"     Niece stated Current Barriers:  [name] Knowledge Deficits related to Diabetes disease process and Self Health Management   Nurse Case Manager Clinical Goal(s):  [name] Over the next 90 days, patient will work with the CCM team  to address needs related to Diabetes disease education and nutritional recommendations.   02/04/19 - target goal date re-established to 90 days due to COVID-19 treatment delays  CCM RN CM Interventions:  02/04/19 call completed with niece [name]  . Evaluation of current treatment plan related to prediabetes and patient's adherence to plan as established by provider. . Reviewed and discussed patient's most recent A1C of 5.9 (8 mo ago); discussed desired nondiabetic A1C range . Mailed printed educational materials related to Diabetes meal planning, Know your A1C and Diabetes disease process; What Can I Eat - mailed to niece's home address provided - niece did not receive materials previously mailed to patient's home  . Provided niece with RN CM contact # and hours of availability . Discussed plans with patient for ongoing care management follow up and provided patient with direct contact information for care management team  Patient Self Care Activities:  . Attends all scheduled provider  appointments . Calls pharmacy for medication refills . Calls provider office for new concerns or questions  Please see past updates related to this goal by clicking on the "Past Updates" button in the selected goal       . "Ko has more bruising than usual"     Niece stated Current Barriers:  [name] Knowledge Deficits related to etiology  for increased bruising  . Dual anticoagulation therapy   Nurse Case Manager Clinical Goal(s):  [name] Over the next 30 days, patient will be evaluated by PCP for potential cause for increased bruising and will verbalize understanding of plan for diagnosis and treatment of this condition.   CCM RN CM Interventions:  02/04/19 call completed with niece [name]   . Evaluation of current treatment plan related to increased bruising and patient's adherence to plan as established by provider. . Advised patient to confirm with patient that he is not taking extra doses of ASA; discussed importance of ensuring patient is taking his Plavix exactly as prescribed . Provided education to patient re: potential SE of taking dual anticoagulation therapies including increased bruising; Assessed for other s/s of bleeding including nose bleeds, gums bleeding when performing oral hygiene, noted blood in vomit or stool - niece denies ; Assessed for other potential cause for increased bleeding such as falls or impaired balance causing patient to fall into doorways, etc. - niece denies - niece reports the patient did report to her that "he bumped his arm on the car door while getting in" . Reviewed medications with patient and discussed dual anticoagulants including dosage and frequency . Collaborated with [name], FNP regarding reported symptoms of increased bruising . Discussed plans with patient for ongoing care management follow up and provided patient with direct contact information for care management team . Reviewed scheduled/upcoming provider appointments including: niece  [name] will call the [place] office to schedule a face to face OV with provider [name], FNP  Patient Self Care Activities:  . Self administers medications as prescribed . Attends all scheduled provider appointments . Performs ADL's independently . Performs IADL's independently  Initial goal documentation     . DIET - INCREASE WATER INTAKE (pt-stated)     Wants to increase water intake form 2 to 4 glasses a day       Fall Risk Fall Risk  02/17/2019 11/18/2018 05/15/2018  Falls in the past year? 0 0 No  Risk for fall due to : Impaired balance/gait;Medication side effect;Impaired mobility - Impaired balance/gait;Medication side effect;Impaired mobility  Follow up Falls evaluation completed;Education provided;Falls prevention discussed - -   Is the patient's home free of loose throw rugs in walkways, pet beds, electrical cords, etc?   yes      Grab bars in the bathroom? yes      Handrails on the stairs?   yes      Adequate lighting?   yes  Timed Get Up and Go Performed: n/a  Depression Screen PHQ 2/9 Scores 02/17/2019 11/18/2018 05/15/2018  PHQ - 2 Score 0 0 0  PHQ- 9 Score 0 - 0    Cognitive Function     6CIT Screen 02/17/2019 05/15/2018  What Year? 4 points 4 points  What month? 3 points 0 points  What time? 0 points 3 points  Count back from 20 4 points 4 points  Months in reverse 4 points 4 points  Repeat phrase 10 points 10 points  Total Score 25 25    Immunization History  Administered Date(s) Administered  . Influenza-Unspecified 03/24/2018  . Pneumococcal Conjugate-13 09/18/2017  . Tdap 06/22/2013    Qualifies for Shingles Vaccine? yes  Screening Tests Health Maintenance  Topic Date Due  . INFLUENZA VACCINE  03/07/2019  . TETANUS/TDAP  06/23/2023  . PNA vac Low Risk Adult  Completed   Cancer Screenings: Lung: Low Dose CT Chest recommended if Age 21-80 years, 9  pack-year currently smoking OR have quit w/in 15years. Patient does not qualify.  Colorectal: 04/2017  Additional Screenings:  Hepatitis C Screening:n/a      Plan:    6 CIT was 25. Patient's niece believes it is due to hearing and education level.  I have personally reviewed and noted the following in the patient's chart:   . Medical and social history . Use of alcohol, tobacco or illicit drugs  . Current medications and supplements . Functional ability and status . Nutritional status . Physical activity . Advanced directives . List of other physicians . Hospitalizations, surgeries, and ER visits in previous 12 months . Vitals . Screenings to include cognitive, depression, and falls . Referrals and appointments  In addition, I have reviewed and discussed with patient certain preventive protocols, quality metrics, and best practice recommendations. A written personalized care plan for preventive services as well as general preventive health recommendations were provided to patient.     Kellie Simmering, LPN  [id number]

## 2019-02-17 NOTE — Progress Notes (Signed)
Subjective:     Patient ID: [name] , male    DOB: 10/21/1935 , 83 y.o.   MRN: [id number]   Chief Complaint  Patient presents with  . Hypertension  . Leg Swelling    HPI  Right  lower extremity swelling, was able to walk. She went to [facility] with an xray and did not see anything wrong.  Did not have shortness of breath.  ABI was done which were slightly abnormal.   His niece is concerned could be      Hypertension This is a chronic problem. The current episode started more than 1 year ago. The problem is controlled. Pertinent negatives include no anxiety, blurred vision, chest pain, headaches, malaise/fatigue, palpitations, peripheral edema or shortness of breath.     Past Medical History:  Diagnosis Date  . Hyperlipidemia 2001  . Hypertension 2001  . Stroke [facility])      Family History  Problem Relation Age of Onset  . Stroke Mother   . Stroke Father   . Heart attack Father   . Cancer Brother      Current Outpatient Medications:  .  aspirin EC 81 MG tablet, Take 81 mg by mouth daily., Disp: , Rfl:  .  cetirizine (ZYRTEC) 10 MG tablet, Take 10 mg by mouth as needed for allergies., Disp: , Rfl:  .  clopidogrel (PLAVIX) 75 MG tablet, Take 1 tablet (75 mg total) by mouth daily., Disp: 90 tablet, Rfl: 1 .  diclofenac sodium (VOLTAREN) 1 % GEL, Apply 2 g topically 4 (four) times daily. Rub into affected area of foot 2 to 4 times daily (Patient not taking: Reported on 02/17/2019), Disp: 100 g, Rfl: 2 .  diltiazem (CARDIZEM CD) 180 MG 24 hr capsule, TAKE ONE CAPSULE BY MOUTH EVERY DAY, Disp: 90 capsule, Rfl: 0 .  diltiazem (TIAZAC) 180 MG 24 hr capsule, Take 180 mg by mouth daily., Disp: , Rfl:  .  lisinopril-hydrochlorothiazide (ZESTORETIC) 10-12.5 MG tablet, Take 1 tablet by mouth daily., Disp: 90 tablet, Rfl: 1 .  rosuvastatin (CRESTOR) 10 MG tablet, TAKE 1 TABLET BY MOUTH EVERY DAY, Disp: 90 tablet, Rfl: 1   No Known Allergies   Review of Systems   Constitutional: Negative for malaise/fatigue.  Eyes: Negative for blurred vision.  Respiratory: Negative for cough, shortness of breath and wheezing.   Cardiovascular: Negative.  Negative for chest pain, palpitations and leg swelling.  Musculoskeletal: Positive for gait problem.       Right hemiparesis   Neurological: Negative for dizziness and headaches.     Today's Vitals   02/17/19 1148  BP: 118/60  Pulse: 73  Temp: 97.7 F (36.5 C)  TempSrc: Oral  Weight: 167 lb 6.4 oz (75.9 kg)  Height: 5\' 9"  (1.753 m)   Body mass index is 24.72 kg/m.   Objective:  Physical Exam Vitals signs reviewed.  Constitutional:      Appearance: Normal appearance.  Cardiovascular:     Rate and Rhythm: Normal rate and regular rhythm.     Pulses: Normal pulses.     Heart sounds: Normal heart sounds. No murmur.  Pulmonary:     Effort: Pulmonary effort is normal. No respiratory distress.     Breath sounds: Normal breath sounds.  Musculoskeletal:        General: Deformity (right hemiparesis, brace to right lower extremity) present. No swelling or tenderness.     Right lower leg: No edema.     Left lower leg: No edema.  Skin:    General: Skin is warm and dry.     Capillary Refill: Capillary refill takes less than 2 seconds.     Findings: Bruising present.  Neurological:     Mental Status: He is alert and oriented to person, place, and time.  Psychiatric:        Mood and Affect: Mood normal.        Behavior: Behavior normal.        Thought Content: Thought content normal.        Judgment: Judgment normal.         Assessment And Plan:     1. Prediabetes  Chronic  Stable  No current medications at this time  Encouraged to avoid sugary foods and drinks  - Hemoglobin A1c - Lipid Profile  2. Cerebrovascular accident (CVA), unspecified mechanism ([place])  History of CVA with right side hemiparesis  Continue with clopidrogel  Discussed bruising with niece and advised if worsens to  let me know and we may need to consider discontinuing - CBC with Diff - Lipid Profile  3. Essential hypertension . B/P is in excellent control.  . CMP ordered to check renal function.  . The importance of regular exercise and dietary modification was stressed to the patient.  - CBC with Diff - CMP14 + Anion Gap - Lipid Profile  4. Right hemiparesis (HCC)  Wears brace to arm and lower extremity  5. Right ankle swelling  Likely related to his history of CVA however due to the concern of the niece I will check for metabolic causes  Advised to make sure to elevate feet when possible. - Uric acid - Rheumatoid (RA) Factor - ANA, IFA (with reflex)  6. Mixed hyperlipidemia  Chronic, controlled  Continue with current medications    [name], FNP    THE PATIENT IS ENCOURAGED TO PRACTICE SOCIAL DISTANCING DUE TO THE COVID-19 PANDEMIC.

## 2019-02-19 LAB — CBC WITH DIFFERENTIAL/PLATELET
Basophils Absolute: 0.1 10*3/uL (ref 0.0–0.2)
Basos: 1 %
EOS (ABSOLUTE): 0.2 10*3/uL (ref 0.0–0.4)
Eos: 3 %
Hematocrit: 36.2 % — ABNORMAL LOW (ref 37.5–51.0)
Hemoglobin: 12.9 g/dL — ABNORMAL LOW (ref 13.0–17.7)
Immature Grans (Abs): 0 10*3/uL (ref 0.0–0.1)
Immature Granulocytes: 1 %
Lymphocytes Absolute: 1.8 10*3/uL (ref 0.7–3.1)
Lymphs: 32 %
MCH: 32.7 pg (ref 26.6–33.0)
MCHC: 35.6 g/dL (ref 31.5–35.7)
MCV: 92 fL (ref 79–97)
Monocytes Absolute: 0.6 10*3/uL (ref 0.1–0.9)
Monocytes: 12 %
Neutrophils Absolute: 2.9 10*3/uL (ref 1.4–7.0)
Neutrophils: 51 %
Platelets: 250 10*3/uL (ref 150–450)
RBC: 3.95 x10E6/uL — ABNORMAL LOW (ref 4.14–5.80)
RDW: 15.4 % (ref 11.6–15.4)
WBC: 5.6 10*3/uL (ref 3.4–10.8)

## 2019-02-19 LAB — CMP14 + ANION GAP
ALT: 44 IU/L (ref 0–44)
AST: 33 IU/L (ref 0–40)
Albumin/Globulin Ratio: 2.4 — ABNORMAL HIGH (ref 1.2–2.2)
Albumin: 4.8 g/dL — ABNORMAL HIGH (ref 3.6–4.6)
Alkaline Phosphatase: 67 IU/L (ref 39–117)
Anion Gap: 14 mmol/L (ref 10.0–18.0)
BUN/Creatinine Ratio: 11 (ref 10–24)
BUN: 12 mg/dL (ref 8–27)
Bilirubin Total: 0.8 mg/dL (ref 0.0–1.2)
CO2: 22 mmol/L (ref 20–29)
Calcium: 9.8 mg/dL (ref 8.6–10.2)
Chloride: 103 mmol/L (ref 96–106)
Creatinine, Ser: 1.06 mg/dL (ref 0.76–1.27)
GFR calc Af Amer: 76 mL/min/{1.73_m2} (ref >59)
GFR calc non Af Amer: 65 mL/min/{1.73_m2} (ref >59)
Globulin, Total: 2 g/dL (ref 1.5–4.5)
Glucose: 86 mg/dL (ref 65–99)
Potassium: 4 mmol/L (ref 3.5–5.2)
Sodium: 139 mmol/L (ref 134–144)
Total Protein: 6.8 g/dL (ref 6.0–8.5)

## 2019-02-19 LAB — URIC ACID: Uric Acid: 5.5 mg/dL (ref 3.7–8.6)

## 2019-02-19 LAB — LIPID PANEL
Chol/HDL Ratio: 2.6 ratio (ref 0.0–5.0)
Cholesterol, Total: 149 mg/dL (ref 100–199)
HDL: 58 mg/dL (ref >39)
LDL Calculated: 75 mg/dL (ref 0–99)
Triglycerides: 81 mg/dL (ref 0–149)
VLDL Cholesterol Cal: 16 mg/dL (ref 5–40)

## 2019-02-19 LAB — RHEUMATOID FACTOR: Rheumatoid fact SerPl-aCnc: <10 IU/mL (ref 0.0–13.9)

## 2019-02-19 LAB — HEMOGLOBIN A1C
Est. average glucose Bld gHb Est-mCnc: 126 mg/dL
Hgb A1c MFr Bld: 6 % — ABNORMAL HIGH (ref 4.8–5.6)

## 2019-02-19 LAB — ANTINUCLEAR ANTIBODIES, IFA: ANA Titer 1: NEGATIVE

## 2019-02-20 ENCOUNTER — Ambulatory Visit (HOSPITAL_COMMUNITY)
Admission: RE | Admit: 2019-02-20 | Discharge: 2019-02-20 | Disposition: A | Discharge status: Home / Self Care | Payer: Medicare Other | Source: Ambulatory Visit | Attending: Cardiology | Admitting: Cardiology

## 2019-02-20 ENCOUNTER — Other Ambulatory Visit: Payer: Self-pay

## 2019-02-20 DIAGNOSIS — I739 Peripheral vascular disease, unspecified: Secondary | ICD-10-CM

## 2019-02-20 DIAGNOSIS — M25473 Effusion, unspecified ankle: Secondary | ICD-10-CM | POA: Diagnosis present

## 2019-02-23 ENCOUNTER — Other Ambulatory Visit: Payer: Self-pay

## 2019-02-23 ENCOUNTER — Telehealth: Payer: Self-pay

## 2019-02-23 DIAGNOSIS — M25473 Effusion, unspecified ankle: Secondary | ICD-10-CM

## 2019-02-23 DIAGNOSIS — I739 Peripheral vascular disease, unspecified: Secondary | ICD-10-CM

## 2019-02-23 NOTE — Progress Notes (Signed)
Spoke with patient's caregiver, niece, she states that the cardiologist office is working with her to get the patient an appointment to follow up from abnormal circulation testing.

## 2019-02-24 ENCOUNTER — Ambulatory Visit: Payer: Self-pay

## 2019-02-24 DIAGNOSIS — I639 Cerebral infarction, unspecified: Secondary | ICD-10-CM

## 2019-02-24 DIAGNOSIS — I10 Essential (primary) hypertension: Secondary | ICD-10-CM

## 2019-02-24 DIAGNOSIS — R7303 Prediabetes: Secondary | ICD-10-CM

## 2019-02-24 NOTE — Chronic Care Management (AMB) (Signed)
  Chronic Care Management   Outreach Note  02/24/2019 Name: [name] MRN: [id number] DOB: 1936/06/19  Referred by: [name], FNP Reason for referral : Chronic Care Management (CCM RNCM Telephone Follow up )   An unsuccessful telephone outreach was attempted today. The patient was referred to the case management team by [name] FNP for assistance with chronic care management and care coordination.   Follow Up Plan: Telephone follow up appointment with care management team member scheduled for: 03/19/19  Barb Merino, RN, BSN, CCM Care Management Coordinator [place] [facility]  Direct Phone: [phone]

## 2019-03-03 ENCOUNTER — Ambulatory Visit: Payer: Medicare Other | Admitting: Podiatry

## 2019-03-05 ENCOUNTER — Telehealth: Payer: Self-pay

## 2019-03-05 LAB — HM DIABETES EYE EXAM

## 2019-03-05 NOTE — Telephone Encounter (Signed)
I called patient's niece to notify her that [facility] has been trying to contact them for home health services. I have left her a v/m to call the office so I can provide her with [facility] number so she can return their call. [place]

## 2019-03-05 NOTE — Telephone Encounter (Signed)
Patient's niece returned my call and I have provided her with the number to [facility]. YRL,RMA

## 2019-03-09 ENCOUNTER — Encounter: Payer: Self-pay | Admitting: Nurse Practitioner

## 2019-03-11 ENCOUNTER — Telehealth: Payer: Self-pay | Admitting: *Deleted

## 2019-03-11 DIAGNOSIS — I739 Peripheral vascular disease, unspecified: Secondary | ICD-10-CM

## 2019-03-11 DIAGNOSIS — M25473 Effusion, unspecified ankle: Secondary | ICD-10-CM

## 2019-03-11 NOTE — Telephone Encounter (Signed)
Pt's niece, [name] states Dr. Jacqualyn Posey wanted pt to see a vascular doctor and she thought her family had one but they do not, but she has once they would like to use.

## 2019-03-12 ENCOUNTER — Telehealth: Payer: Self-pay

## 2019-03-12 NOTE — Telephone Encounter (Signed)
Left message requesting a call back to discuss the referral of her uncle to a vascular doctor.

## 2019-03-12 NOTE — Telephone Encounter (Signed)
Faxed required referral form to [facility]. Faxed referral to cancel to [facility].

## 2019-03-12 NOTE — Telephone Encounter (Signed)
[name] called and states pt saw Dr. Jacqualyn Posey and the dopplers are abnormal and the office that performed the testing stated pt would need interventional surgery. [name] states they would like pt to see Dr. Trula Slade. I told pt I would pull the referral and make the request for pt to be referred to Dr. Trula Slade.

## 2019-03-13 ENCOUNTER — Ambulatory Visit: Payer: Medicare Other | Admitting: Cardiovascular Disease

## 2019-03-19 ENCOUNTER — Telehealth: Payer: Self-pay

## 2019-03-25 ENCOUNTER — Ambulatory Visit: Payer: Medicare Other | Admitting: Cardiovascular Disease

## 2019-04-17 ENCOUNTER — Telehealth: Payer: Self-pay

## 2019-04-29 ENCOUNTER — Other Ambulatory Visit: Payer: Self-pay | Admitting: Nurse Practitioner

## 2019-05-05 ENCOUNTER — Ambulatory Visit: Payer: Self-pay

## 2019-05-05 ENCOUNTER — Telehealth: Payer: Self-pay

## 2019-05-05 DIAGNOSIS — I10 Essential (primary) hypertension: Secondary | ICD-10-CM

## 2019-05-05 DIAGNOSIS — E782 Mixed hyperlipidemia: Secondary | ICD-10-CM

## 2019-05-05 NOTE — Chronic Care Management (AMB) (Signed)
  Chronic Care Management   Social Work Note  05/05/2019 Name: GARRITT MOLYNEUX MRN: [id number] DOB: 03-21-1936  CCM SW placed an unsuccessful outbound call to the patients niece [name] in effort to assess progression of patient goals. SW left a HIPAA compliant voice message requesting a return call.   Follow Up Plan: SW will place a follow up outreach call over the next two weeks if no return call is received.  Daneen Schick, BSW, CDP Social Worker, Certified Dementia Practitioner [place] [facility] [place] [facility] [phone]

## 2019-05-11 ENCOUNTER — Encounter: Payer: Self-pay | Admitting: Surgery

## 2019-05-11 ENCOUNTER — Other Ambulatory Visit: Payer: Self-pay

## 2019-05-11 ENCOUNTER — Ambulatory Visit (INDEPENDENT_AMBULATORY_CARE_PROVIDER_SITE_OTHER): Payer: Medicare Other | Admitting: Surgery

## 2019-05-11 DIAGNOSIS — I739 Peripheral vascular disease, unspecified: Secondary | ICD-10-CM | POA: Insufficient documentation

## 2019-05-11 NOTE — Progress Notes (Signed)
[facility] and [facility]in Specialist [facility]  Patient name: [name] MRN: [id number] DOB: 1935/09/17 Sex: male   REQUESTING PROVIDER:    Dr. Ardelle Anton   REASON FOR CONSULT:    PAD  HISTORY OF PRESENT ILLNESS:   [name] is a 83 y.o. male, who is referred for evaluation of peripheral vascular disease.  He recently developed some swelling in his right leg.  On examination he did not have palpable pulses and so ABIs and a duplex were obtained which showed significant findings including a right iliac occlusion and left superficial femoral and external iliac artery stenosis.  Patient is very hard of hearing and so I communicated today with his niece.  He reportedly does not have any leg pain at this time.  He denies any cramping with activity.  He does not have any open wounds.  The patient lives by himself.  He suffers from hyperlipidemia which is managed with a statin.  He is medically treated for hypertension.  He is on aspirin Plavix as he has a history of stroke several years ago.  PAST MEDICAL HISTORY    Past Medical History:  Diagnosis Date  . Hyperlipidemia 2001  . Hypertension 2001  . Stroke [facility])      FAMILY HISTORY   Family History  Problem Relation Age of Onset  . Stroke Mother   . Stroke Father   . Heart attack Father   . Cancer Brother     SOCIAL HISTORY:   Social History   Socioeconomic History  . Marital status: Single    Spouse name: Not on file  . Number of children: Not on file  . Years of education: Not on file  . Highest education level: Not on file  Occupational History  . Occupation: retired  Engineer, production  . Financial resource strain: Not hard at all  . Food insecurity    Worry: Never true    Inability: Never true  . Transportation needs    Medical: No    Non-medical: No  Tobacco Use  . Smoking status: Never Smoker  . Smokeless tobacco: Never Used  Substance and Sexual Activity  . Alcohol  use: Not Currently  . Drug use: Never  . Sexual activity: Not Currently  Lifestyle  . Physical activity    Days per week: 0 days    Minutes per session: 0 min  . Stress: Not at all  Relationships  . Social Musician on phone: Not on file    Gets together: Not on file    Attends religious service: Not on file    Active member of club or organization: Not on file    Attends meetings of clubs or organizations: Not on file    Relationship status: Not on file  . Intimate partner violence    Fear of current or ex partner: No    Emotionally abused: No    Physically abused: No    Forced sexual activity: No  Other Topics Concern  . Not on file  Social History Narrative  . Not on file    ALLERGIES:    No Known Allergies  CURRENT MEDICATIONS:    Current Outpatient Medications  Medication Sig Dispense Refill  . aspirin EC 81 MG tablet Take 81 mg by mouth daily.    . cetirizine (ZYRTEC) 10 MG tablet Take 10 mg by mouth as needed for allergies.    [name] clopidogrel (PLAVIX) 75 MG tablet Take 1 tablet (75  mg total) by mouth daily. 90 tablet 1  . diclofenac sodium (VOLTAREN) 1 % GEL Apply 2 g topically 4 (four) times daily. Rub into affected area of foot 2 to 4 times daily 100 g 2  . diltiazem (CARDIZEM CD) 180 MG 24 hr capsule TAKE ONE CAPSULE BY MOUTH EVERY DAY 90 capsule 0  . diltiazem (TIAZAC) 180 MG 24 hr capsule Take 180 mg by mouth daily.    [name] lisinopril-hydrochlorothiazide (ZESTORETIC) 10-12.5 MG tablet Take 1 tablet by mouth daily. 90 tablet 1  . rosuvastatin (CRESTOR) 10 MG tablet TAKE 1 TABLET BY MOUTH EVERY DAY 90 tablet 1   No current facility-administered medications for this visit.     REVIEW OF SYSTEMS:   [X]  denotes positive finding, [ ]  denotes negative finding Cardiac  Comments:  Chest pain or chest pressure:    Shortness of breath upon exertion:    Short of breath when lying flat:    Irregular heart rhythm:        Vascular    Pain in calf, thigh,  or hip brought on by ambulation:    Pain in feet at night that wakes you up from your sleep:     Blood clot in your veins:    Leg swelling:         Pulmonary    Oxygen at home:    Productive cough:     Wheezing:         Neurologic    Sudden weakness in arms or legs:     Sudden numbness in arms or legs:     Sudden onset of difficulty speaking or slurred speech:    Temporary loss of vision in one eye:     Problems with dizziness:         Gastrointestinal    Blood in stool:      Vomited blood:         Genitourinary    Burning when urinating:     Blood in urine:        Psychiatric    Major depression:         Hematologic    Bleeding problems:    Problems with blood clotting too easily:        Skin    Rashes or ulcers:        Constitutional    Fever or chills:     PHYSICAL EXAM:   Vitals:   05/11/19 0850  BP: 128/68  Pulse: 65  Resp: 18  Temp: 97.7 F (36.5 C)  SpO2: 99%  Weight: 166 lb 11.2 oz (75.6 kg)  Height: 5\' 9"  (1.753 m)    GENERAL: The patient is a well-nourished male, in no acute distress. The vital signs are documented above. CARDIAC: There is a regular rate and rhythm.  VASCULAR: Nonpalpable pedal pulses PULMONARY: Nonlabored respirations MUSCULOSKELETAL: There are no major deformities or cyanosis. NEUROLOGIC: No focal weakness or paresthesias are detected. SKIN: There are no ulcers or rashes noted. PSYCHIATRIC: The patient has a normal affect.  STUDIES:   I have reviewed the following: ABI/TBIToday's ABIToday's TBIPrevious ABIPrevious TBI +-------+-----------+-----------+------------+------------+ Right  .44        .24                                 +-------+-----------+-----------+------------+------------+ Left   .94        .51                                 +-------+-----------+-----------+------------+------------+  Right toe:32 Left toe:70   Right: Probable total occlusion noted in the external iliac artery,  although proximal segment and mid and distal common iliac artery were not visualized. 75-99% stenosis noted in the distal superficial femoral artery.  Left: 50-74% stenosis noted in the mid and distal external iliac artery.  Distal occlusion noted in the anterior tibial artery.   ASSESSMENT and PLAN   PAD: The patient is currently without symptoms.  I discussed that we would treat him non-operatively at this time which would include blood pressure control, treatment with a statin, and an activity program.  We also discussed that if he were to develop a wound on his foot that they need to contact me immediately.  I will plan on having the patient return in 1 year with follow-up vascular lab studies  Carotid disease: The patient has a history of stroke.  His MRA showed significant vertebral disease and he has not had any additional imaging.  I will get a carotid duplex within the next month.  Abdominal aorta: The patient is very concerned about aortic disease.  This was not well visualized today.  I will bring him back at the time of his carotid where he can be n.p.o. and get an aortic study.   Charlena Cross, MD, FACS [facility] [place] [phone] Pager [phone]

## 2019-05-14 ENCOUNTER — Ambulatory Visit (INDEPENDENT_AMBULATORY_CARE_PROVIDER_SITE_OTHER): Payer: Medicare Other

## 2019-05-14 DIAGNOSIS — I639 Cerebral infarction, unspecified: Secondary | ICD-10-CM | POA: Diagnosis not present

## 2019-05-14 DIAGNOSIS — I739 Peripheral vascular disease, unspecified: Secondary | ICD-10-CM

## 2019-05-14 DIAGNOSIS — I10 Essential (primary) hypertension: Secondary | ICD-10-CM | POA: Diagnosis not present

## 2019-05-14 NOTE — Chronic Care Management (AMB) (Signed)
Chronic Care Management    Clinical Social Work Follow Up Note  05/14/2019 Name: [name] MRN: [id number] DOB: [birth year]  [name] is a 83 y.o. year old male who is a primary care patient of [name], [name]. The CCM team was consulted for assistance with chronic care management and care coordination.  Review of patient status, including review of consultants reports, other relevant assessments, and collaboration with appropriate care team members and the patient's provider was performed as part of comprehensive patient evaluation and provision of chronic care management services.    I placed an outbound call to the patients niece and caregiver [name] to assist with care coordination of patient chronic conditions. See care plan below regarding today's interventions.  SDOH (Social Determinants of Health) screening performed today: None.   Outpatient Encounter Medications as of 05/14/2019  Medication Sig  . aspirin EC 81 MG tablet Take 81 mg by mouth daily.  . cetirizine (ZYRTEC) 10 MG tablet Take 10 mg by mouth as needed for allergies.  [name] clopidogrel (PLAVIX) 75 MG tablet Take 1 tablet (75 mg total) by mouth daily.  . diclofenac sodium (VOLTAREN) 1 % GEL Apply 2 g topically 4 (four) times daily. Rub into affected area of foot 2 to 4 times daily  . diltiazem (CARDIZEM CD) 180 MG 24 hr capsule TAKE ONE CAPSULE BY MOUTH EVERY DAY  . diltiazem (TIAZAC) 180 MG 24 hr capsule Take 180 mg by mouth daily.  [name] lisinopril-hydrochlorothiazide (ZESTORETIC) 10-12.5 MG tablet Take 1 tablet by mouth daily.  . rosuvastatin (CRESTOR) 10 MG tablet TAKE 1 TABLET BY MOUTH EVERY DAY   No facility-administered encounter medications on file as of 05/14/2019.      Goals Addressed            This Visit's Progress   . "He is having pain and swelling to his right ankle"   On track    Niece stated  Current Barriers:  [name] Knowledge Deficits related to diagnosis and treatment for right ankle  pain and swelling   Nurse Case Manager Clinical Goal(s):  [name] Over the next 30 days, patient/niece [name] will verbalize understanding of plan for diagnosis and treatment management for right ankle pain and swelling. . Over the next 30 days, patient will have no mobility issues related to pain or swelling to his right ankle. . 05/14/2019- Over the next 60 days the patient will work with RN Case Manager to develop a better understanding of recent vascular studies as evidenced by ability to verbalize basic understanding of signs and symptoms of a blood clot  CCM SW Interventions Completed 05/14/2019 with [name] . Follow up call placed to the patients niece to assess progression of patient goal . Determined the patient did undergo a recent vascular study "we decided not to do surgery at this time because he isn't having any symptoms" . Informed by [name] that if the patient were to develop symptoms of a clot he would need to have surgical intervention . Assessed for [name]'s knowledge of what signs to look for "they told us some stuff but I would love to know more" . Scheduled a telephonic visit with RN Case Manager to provide disease education . Collaboration with RN Case Manager regarding today's interventions  Patient Self Care Activities:  . Self administers medications as prescribed . Attends all scheduled provider appointments . Performs ADL's independently . Performs IADL's independently  Please see past updates related to this goal by  clicking on the "Past Updates" button in the selected goal      . "He needs to work on his ROM and dexterity"   Not on track    Niece stated  Current Barriers:  . Impaired Physical Mobility  . Impaired dexterity functionality  Nurse Case Manager Clinical Goal(s):  [name] Over the next 30 days, patient/niece will verbalize understanding of plan for in home PT/OT for HSE and therapy services to help with balance, strengthening and help  improve dexterity. Goal Not Met  . 02/04/19 - Over the next 30 days, patient will have in home PT/OT services in place.  Goal not met . 05/14/2019- Over the next 30 days the patient will have in home PT services in place  CCM SW Interventions: Completed 05/14/19 with niece [name] . Outbound call to the patients niece to assess progression of patient goal . Determined the patient had not established care with home health PT as previously planned . Performed chart review to not home health orders had been placed by the patients provider in July . Assessed for interest in services at this time "I know he really needs it especially after his vascular study" . Educated [name] on the parameters surrounding provider ability to order home health based on length of time since the patient was last seen in the practice. [name] is aware the patient may have to arrange a visit with his provider prior to accessing home health orders . Collaboration with William Brine FNP requesting home health orders on behalf of the patient . Advised [name] SW would follow up with she and the patient once a response is received from the patients provider . Collaboration with RN Case Manager regarding today's interventions  Patient Self Care Activities:  . Attends all scheduled provider appointments . Calls pharmacy for medication refills . Calls provider office for new concerns or questions  Please see past updates related to this goal by clicking on the "Past Updates" button in the selected goal       . COMPLETED: "[name] has more bruising than usual"       Niece stated Current Barriers:  [name] Knowledge Deficits related to etiology for increased bruising  . Dual anticoagulation therapy   Nurse Case Manager Clinical Goal(s):  [name] Over the next 30 days, patient will be evaluated by PCP for potential cause for increased bruising and will verbalize understanding of plan for diagnosis and treatment of  this condition.   CCM SW Interventions Completed 05/14/19 with niece [name] . Outbound call to [name] to assess progression of patient goal . Determined the patient was seen by his provider on 7/14 regarding concern for continued bruising. The patient has also underwent a vascular vein study (see alternate goal) . Assessed for further concerns surrounding bruising - none expressed at this time . Goal Met  Patient Self Care Activities:  . Self administers medications as prescribed . Attends all scheduled provider appointments . Performs ADL's independently . Performs IADL's independently  Please see past updates related to this goal by clicking on the "Past Updates" button in the selected goal          Follow Up Plan: RN Case Manager will follow up with the patient over the next 3 weeks to assist with disease management. SW will conduct an outreach call over the next 2-3 months to assess for newly identified SDOH needs.   Daneen Schick, BSW, CDP Social Worker, Teacher, adult education /  [place] [facility] [phone]  Total time spent performing care coordination and/or care management activities with the patient by phone or face to face = 26 minutes.

## 2019-05-14 NOTE — Patient Instructions (Signed)
Social Worker Visit Information  Goals we discussed today:  Goals Addressed            This Visit's Progress   . "He is having pain and swelling to his right ankle"   On track    Niece stated  Current Barriers:  [name] Knowledge Deficits related to diagnosis and treatment for right ankle pain and swelling   Nurse Case Manager Clinical Goal(s):  [name] Over the next 30 days, patient/niece [name] will verbalize understanding of plan for diagnosis and treatment management for right ankle pain and swelling. . Over the next 30 days, patient will have no mobility issues related to pain or swelling to his right ankle. . 05/14/2019- Over the next 60 days the patient will work with RN Case Manager to develop a better understanding of recent vascular studies as evidenced by ability to verbalize basic understanding of signs and symptoms of a blood clot  CCM SW Interventions Completed 05/14/2019 with [name] . Follow up call placed to the patients niece to assess progression of patient goal . Determined the patient did undergo a recent vascular study "we decided not to do surgery at this time because he isn't having any symptoms" . Informed by [name] that if the patient were to develop symptoms of a clot he would need to have surgical intervention . Assessed for [name]'s knowledge of what signs to look for "they told us some stuff but I would love to know more" . Scheduled a telephonic visit with RN Case Manager to provide disease education . Collaboration with RN Case Manager regarding today's interventions  CCM RN CM Interventions:  02/04/19 call completed with niece [name]  . Evaluation of current treatment plan related to right ankle pain and swelling and patient's adherence to plan as established by provider. . Collaborated with [name], FNP  regarding symptoms of intermittent pain and swelling to patient's right ankle; updated on recent OV with Dr. Earleen Newport with [facility]; updated provider that this physician took xrays and assessed for vascular involvement; advised a referral was sent for Vascular studies to be completed . Discussed plans with patient for ongoing care management follow up and provided patient with direct contact information for care management team . Reviewed scheduled/upcoming provider appointments including: niece [name] plans to call the [place] office to schedule an in office visit for patient to f/u with [name], FNP  Patient Self Care Activities:  . Self administers medications as prescribed . Attends all scheduled provider appointments . Performs ADL's independently . Performs IADL's independently  Please see past updates related to this goal by clicking on the "Past Updates" button in the selected goal      . "He needs to work on his ROM and dexterity"   Not on track    Niece stated  Current Barriers:  . Impaired Physical Mobility  . Impaired dexterity functionality  Nurse Case Manager Clinical Goal(s):  [name] Over the next 30 days, patient/niece will verbalize understanding of plan for in home PT/OT for HSE and therapy services to help with balance, strengthening and help improve dexterity. Goal Not Met  . 02/04/19 - Over the next 30 days, patient will have in home PT/OT services in place.  Goal not met . 05/14/2019- Over the next 30 days the patient will have in home PT services in place  CCM SW Interventions: Completed 05/14/19 with niece [name] . Outbound call to the patients niece to assess progression of  patient goal . Determined the patient had not established care with home health PT as previously planned . Performed chart review to not home health orders had been placed by the patients provider in July . Assessed for interest in services at this time "I know he really needs it especially after his vascular study" . Educated [name] on the parameters surrounding provider ability to order home health based on  length of time since the patient was last seen in the practice. [name] is aware the patient may have to arrange a visit with his provider prior to accessing home health orders . Collaboration with [name] FNP requesting home health orders on behalf of the patient . Advised [name] SW would follow up with she and the patient once a response is received from the patients provider . Collaboration with RN Case Manager regarding today's interventions  CCM RN CM Interventions:  02/04/19 call completed with niece [name]  . Evaluation of current treatment plan related to balance and mobility and patient's adherence to plan as established by provider  Assessed for home safety concerns, assessed for current DME use - pt continues to use his 4 legged cane for ambulation, he has a scooter for outside use - niece denies patient having any falls or near falls  Discussed that niece's preferred provider does not provide in home PT/OT services  Discussed patient continues to need these service and niece is open to using any provider available   Collaborated with provider [name] regarding follow up on referral request for in home PT/OT . Discussed plans with patient for ongoing care management follow up and provided patient with direct contact information for care management team . Provided niece with RN CM contact # and hours of availability  Patient Self Care Activities:  . Attends all scheduled provider appointments . Calls pharmacy for medication refills . Calls provider office for new concerns or questions  Please see past updates related to this goal by clicking on the "Past Updates" button in the selected goal       . COMPLETED: "Willmar has more bruising than usual"       Niece stated Current Barriers:  [name] Knowledge Deficits related to etiology for increased bruising  . Dual anticoagulation therapy   Nurse Case Manager Clinical Goal(s):  [name] Over the next 30 days,  patient will be evaluated by PCP for potential cause for increased bruising and will verbalize understanding of plan for diagnosis and treatment of this condition.   CCM SW Interventions Completed 05/14/19 with niece [name] . Outbound call to [name] to assess progression of patient goal . Determined the patient was seen by his provider on 7/14 regarding concern for continued bruising. The patient has also underwent a vascular vein study (see alternate goal) . Assessed for further concerns surrounding bruising - none expressed at this time . Goal Met  CCM RN CM Interventions:  02/04/19 call completed with niece [name]   . Evaluation of current treatment plan related to increased bruising and patient's adherence to plan as established by provider. . Advised patient to confirm with patient that he is not taking extra doses of ASA; discussed importance of ensuring patient is taking his Plavix exactly as prescribed . Provided education to patient re: potential SE of taking dual anticoagulation therapies including increased bruising; Assessed for other s/s of bleeding including nose bleeds, gums bleeding when performing oral hygiene, noted blood in vomit or stool - niece denies ;  Assessed for other potential cause for increased bleeding such as falls or impaired balance causing patient to fall into doorways, etc. - niece denies - niece reports the patient did report to her that "he bumped his arm on the car door while getting in" . Reviewed medications with patient and discussed dual anticoagulants including dosage and frequency . Collaborated with [name], FNP regarding reported symptoms of increased bruising . Discussed plans with patient for ongoing care management follow up and provided patient with direct contact information for care management team . Reviewed scheduled/upcoming provider appointments including: niece [name] will call the [place] office to schedule a face to face OV with  provider [name], FNP  Patient Self Care Activities:  . Self administers medications as prescribed . Attends all scheduled provider appointments . Performs ADL's independently . Performs IADL's independently  Please see past updates related to this goal by clicking on the "Past Updates" button in the selected goal          Follow Up Plan: CM team to continue to follow and assist with chronic care management.  Daneen Schick, BSW, CDP Social Worker, Certified Dementia Practitioner [facility] [phone]

## 2019-05-20 ENCOUNTER — Ambulatory Visit: Payer: Medicare Other | Admitting: Internal Medicine

## 2019-05-20 ENCOUNTER — Ambulatory Visit: Payer: Medicare Other | Admitting: Nurse Practitioner

## 2019-05-20 ENCOUNTER — Ambulatory Visit: Payer: Medicare Other

## 2019-05-28 ENCOUNTER — Ambulatory Visit: Payer: Self-pay

## 2019-05-28 ENCOUNTER — Telehealth: Payer: Self-pay

## 2019-05-28 DIAGNOSIS — I10 Essential (primary) hypertension: Secondary | ICD-10-CM

## 2019-05-28 DIAGNOSIS — I739 Peripheral vascular disease, unspecified: Secondary | ICD-10-CM

## 2019-05-28 NOTE — Chronic Care Management (AMB) (Signed)
Chronic Care Management     Social Work Follow Up Note  05/28/2019 Name: [name] MRN: [id number] DOB: 07/21/1936  [name] is a 83 y.o. year old male who is a primary care patient of [name], [name]. The CCM team was consulted for assistance with care coordination.   Review of patient status, including review of consultants reports, other relevant assessments, and collaboration with appropriate care team members and the patient's provider was performed as part of comprehensive patient evaluation and provision of chronic care management services.    SW placed an unsuccessful outbound call to the patient's niece and caregiver [name] to assist with care coordination of PT initiation. See care plan below.  Outpatient Encounter Medications as of 05/28/2019  Medication Sig  . aspirin EC 81 MG tablet Take 81 mg by mouth daily.  . cetirizine (ZYRTEC) 10 MG tablet Take 10 mg by mouth as needed for allergies.  [name] clopidogrel (PLAVIX) 75 MG tablet Take 1 tablet (75 mg total) by mouth daily.  . diclofenac sodium (VOLTAREN) 1 % GEL Apply 2 g topically 4 (four) times daily. Rub into affected area of foot 2 to 4 times daily  . diltiazem (CARDIZEM CD) 180 MG 24 hr capsule TAKE ONE CAPSULE BY MOUTH EVERY DAY  . diltiazem (TIAZAC) 180 MG 24 hr capsule Take 180 mg by mouth daily.  [name] lisinopril-hydrochlorothiazide (ZESTORETIC) 10-12.5 MG tablet Take 1 tablet by mouth daily.  . rosuvastatin (CRESTOR) 10 MG tablet TAKE 1 TABLET BY MOUTH EVERY DAY   No facility-administered encounter medications on file as of 05/28/2019.      Goals Addressed            This Visit's Progress   . "He needs to work on his ROM and dexterity"   Not on track    Niece stated  Current Barriers:  . Impaired Physical Mobility  . Impaired dexterity functionality  Nurse Case Manager Clinical Goal(s):  [name] Over the next 30 days, patient/niece will verbalize understanding of plan for in home PT/OT for HSE  and therapy services to help with balance, strengthening and help improve dexterity. Goal Not Met  . 02/04/19 - Over the next 30 days, patient will have in home PT/OT services in place.  Goal not met . 05/14/2019- Over the next 30 days the patient will have in home PT services in place  CCM SW Interventions: Completed 05/28/19 with niece [name] . Communication from patients primary provider indicating patient would need an office visit to obtain a new order for PT . Unsuccessful outbound call placed to the patients niece [name]. SW left voice message requesting [name] schedule an appointment with the patients provider to obtain orders . Collaboration with RN Case Manager to update on progression of patient goal  CCM RN CM Interventions:  02/04/19 call completed with niece [name]  . Evaluation of current treatment plan related to balance and mobility and patient's adherence to plan as established by provider  Assessed for home safety concerns, assessed for current DME use - pt continues to use his 4 legged cane for ambulation, he has a scooter for outside use - niece denies patient having any falls or near falls  Discussed that niece's preferred provider does not provide in home PT/OT services  Discussed patient continues to need these service and niece is open to using any provider available   Collaborated with provider [name] regarding follow up on referral request for in home  PT/OT . Discussed plans with patient for ongoing care management follow up and provided patient with direct contact information for care management team . Provided niece with RN CM contact # and hours of availability  Patient Self Care Activities:  . Attends all scheduled provider appointments . Calls pharmacy for medication refills . Calls provider office for new concerns or questions  Please see past updates related to this goal by clicking on the "Past Updates" button in the  selected goal           Follow Up Plan: A member of the CM team will outreach the patient over the next month as previously planned.   Daneen Schick, BSW, CDP Social Worker, Certified Dementia Practitioner [facility] [phone]  Total time spent performing care coordination and/or care management activities with the patient by phone or face to face = 5 minutes.

## 2019-06-04 ENCOUNTER — Telehealth: Payer: Self-pay

## 2019-06-11 ENCOUNTER — Telehealth: Payer: Self-pay

## 2019-06-23 ENCOUNTER — Other Ambulatory Visit: Payer: Self-pay

## 2019-06-23 DIAGNOSIS — Z8249 Family history of ischemic heart disease and other diseases of the circulatory system: Secondary | ICD-10-CM

## 2019-06-24 ENCOUNTER — Telehealth: Payer: Self-pay

## 2019-06-25 ENCOUNTER — Telehealth (HOSPITAL_COMMUNITY): Payer: Self-pay

## 2019-06-25 ENCOUNTER — Ambulatory Visit: Payer: Self-pay | Admitting: Pharmacist

## 2019-06-25 DIAGNOSIS — I739 Peripheral vascular disease, unspecified: Secondary | ICD-10-CM

## 2019-06-25 DIAGNOSIS — I639 Cerebral infarction, unspecified: Secondary | ICD-10-CM

## 2019-06-25 DIAGNOSIS — I10 Essential (primary) hypertension: Secondary | ICD-10-CM

## 2019-06-25 NOTE — Progress Notes (Signed)
  Chronic Care Management   Outreach Note  06/25/2019 Name: [name] MRN: [id number] DOB: April 28, 1936  Referred by: [name], FNP Reason for referral : No chief complaint on file.   An unsuccessful telephone outreach was attempted today. The patient was referred to the case management team by for assistance with care management and care coordination.   Follow Up Plan: A HIPPA compliant phone message was left for the patient providing contact information and requesting a return call.  The care management team will reach out to the patient again over the next 30 days.   SIGNATURE Regina Eck, PharmD, BCPS Clinical Pharmacist, [facility] [place]: [phone]

## 2019-06-25 NOTE — Telephone Encounter (Signed)

## 2019-06-26 ENCOUNTER — Ambulatory Visit (HOSPITAL_COMMUNITY)
Admission: RE | Admit: 2019-06-26 | Discharge: 2019-06-26 | Disposition: A | Discharge status: Home / Self Care | Payer: Medicare Other | Source: Ambulatory Visit | Attending: Family | Admitting: Family

## 2019-06-26 ENCOUNTER — Ambulatory Visit: Payer: Medicare Other | Admitting: Physician Assistant

## 2019-06-26 ENCOUNTER — Ambulatory Visit (INDEPENDENT_AMBULATORY_CARE_PROVIDER_SITE_OTHER)
Admission: RE | Admit: 2019-06-26 | Discharge: 2019-06-26 | Disposition: A | Discharge status: Home / Self Care | Payer: Medicare Other | Source: Ambulatory Visit | Attending: Family | Admitting: Family

## 2019-06-26 ENCOUNTER — Other Ambulatory Visit: Payer: Self-pay

## 2019-06-26 ENCOUNTER — Other Ambulatory Visit: Payer: Self-pay | Admitting: Nurse Practitioner

## 2019-06-26 VITALS — BP 122/69 | HR 60 | Temp 97.7°F | Resp 14 | Ht 72.0 in | Wt 166.0 lb

## 2019-06-26 DIAGNOSIS — I6523 Occlusion and stenosis of bilateral carotid arteries: Secondary | ICD-10-CM | POA: Insufficient documentation

## 2019-06-26 DIAGNOSIS — E785 Hyperlipidemia, unspecified: Secondary | ICD-10-CM | POA: Insufficient documentation

## 2019-06-26 DIAGNOSIS — I10 Essential (primary) hypertension: Secondary | ICD-10-CM | POA: Diagnosis not present

## 2019-06-26 DIAGNOSIS — I739 Peripheral vascular disease, unspecified: Secondary | ICD-10-CM | POA: Diagnosis not present

## 2019-06-26 DIAGNOSIS — Z8249 Family history of ischemic heart disease and other diseases of the circulatory system: Secondary | ICD-10-CM

## 2019-06-26 DIAGNOSIS — Z8673 Personal history of transient ischemic attack (TIA), and cerebral infarction without residual deficits: Secondary | ICD-10-CM | POA: Diagnosis not present

## 2019-06-26 NOTE — Progress Notes (Signed)
[facility] [place] HISTORY AND PHYSICAL   History of Present Illness:  [name] is a 83 y.o. male, who is referred for evaluation of peripheral vascular disease.  He recently developed some swelling in his right leg.  On examination he did not have palpable pulses and so ABIs and a duplex were obtained which showed significant findings including a right iliac occlusion and left superficial femoral and external iliac artery stenosis.   PAD: The patient is currently without symptoms.  Dr. Myra Gianotti discussed that we would treat him non-operatively at this time which would include blood pressure control, treatment with a statin, and an activity program.  We also discussed that if he were to develop a wound on his foot that they need to contact me immediately.  We will plan on having the patient return in 1 year with follow-up vascular lab studies  Carotid disease: The patient has a history of stroke.  His MRA showed significant vertebral disease and he has not had any additional imaging.  He is here today for f/u carotid duplex.    Abdominal aorta: The patient is very concerned about aortic disease.  This was not well visualized today.  I will bring him back at the time of his carotid where he can be n.p.o. and get an aortic study with a AAA duplex.    He denise non healing wounds on B LE, no sign or symptoms of new CVA or TIA.  He denise abdominal and lumbar pain.  His Niece is with him today.      Past Medical History:  Diagnosis Date  . Hyperlipidemia 2001  . Hypertension 2001  . Stroke [facility])     History reviewed. No pertinent surgical history.  ROS:   General:  No weight loss, Fever, chills  HEENT: No recent headaches, no nasal bleeding, no visual changes, no sore throat  Neurologic: No dizziness, blackouts, seizures. No recent symptoms of stroke or mini- stroke. No recent episodes of slurred speech, or temporary blindness.  Cardiac: No recent episodes of chest  pain/pressure, no shortness of breath at rest.  No shortness of breath with exertion.  Denies history of atrial fibrillation or irregular heartbeat  Vascular: No history of rest pain in feet.  No history of claudication.  No history of non-healing ulcer, No history of DVT   Pulmonary: No home oxygen, no productive cough, no hemoptysis,  No asthma or wheezing  Musculoskeletal:  [ ]  Arthritis, [ ]  Low back pain,  [ ]  Joint pain  Hematologic:No history of hypercoagulable state.  No history of easy bleeding.  No history of anemia  Gastrointestinal: No hematochezia or melena,  No gastroesophageal reflux, no trouble swallowing  Urinary: [ ]  chronic Kidney disease, [ ]  on HD - [ ]  MWF or [ ]  TTHS, [ ]  Burning with urination, [ ]  Frequent urination, [ ]  Difficulty urinating;   Skin: No rashes  Psychological: No history of anxiety,  No history of depression  Social History Social History   Tobacco Use  . Smoking status: Never Smoker  . Smokeless tobacco: Never Used  Substance Use Topics  . Alcohol use: Not Currently  . Drug use: Never    Family History Family History  Problem Relation Age of Onset  . Stroke Mother   . Stroke Father   . Heart attack Father   . Cancer Brother     Allergies  No Known Allergies   Current Outpatient Medications  Medication Sig Dispense  Refill  . aspirin EC 81 MG tablet Take 81 mg by mouth daily.    . cetirizine (ZYRTEC) 10 MG tablet Take 10 mg by mouth as needed for allergies.    [name] clopidogrel (PLAVIX) 75 MG tablet Take 1 tablet (75 mg total) by mouth daily. 90 tablet 1  . diclofenac sodium (VOLTAREN) 1 % GEL Apply 2 g topically 4 (four) times daily. Rub into affected area of foot 2 to 4 times daily 100 g 2  . diltiazem (CARDIZEM CD) 180 MG 24 hr capsule TAKE ONE CAPSULE BY MOUTH EVERY DAY 90 capsule 0  . diltiazem (TIAZAC) 180 MG 24 hr capsule Take 180 mg by mouth daily.    [name] lisinopril-hydrochlorothiazide (ZESTORETIC) 10-12.5 MG tablet Take 1  tablet by mouth daily. 90 tablet 1  . rosuvastatin (CRESTOR) 10 MG tablet TAKE 1 TABLET BY MOUTH EVERY DAY 90 tablet 1   No current facility-administered medications for this visit.     Physical Examination  Vitals:   06/26/19 0926  BP: 122/69  Pulse: 60  Resp: 14  Temp: 97.7 F (36.5 C)  TempSrc: Temporal  SpO2: 98%  Weight: 166 lb (75.3 kg)  Height: 6' (1.829 m)    Body mass index is 22.51 kg/m.  General:  Alert and oriented, no acute distress HEENT: Normal Neck: No bruit or JVD Pulmonary: non labored breathing Cardiac: Regular Rate and Rhythm without murmur Extremity Pulses:  2+ radial, brachial, femoral, dorsalis pedis, posterior tibial pulses bilaterally Musculoskeletal: No deformity, right UE s/p CVA no motor   DATA:  Right Carotid Findings: +----------+--------+--------+--------+-------------------------+--------+           PSV cm/sEDV cm/sStenosisPlaque Description       Comments +----------+--------+--------+--------+-------------------------+--------+ CCA Prox  99      19      <50%    heterogenous                      +----------+--------+--------+--------+-------------------------+--------+ CCA Mid   94      16      <50%    heterogenous                      +----------+--------+--------+--------+-------------------------+--------+ CCA Distal102     20      <50%    heterogenous                      +----------+--------+--------+--------+-------------------------+--------+ ICA Prox  62      17      1-39%   heterogenous and calcific         +----------+--------+--------+--------+-------------------------+--------+ ICA Mid   67      19                                                +----------+--------+--------+--------+-------------------------+--------+ ICA Distal63      20                                                +----------+--------+--------+--------+-------------------------+--------+ ECA       121      17      <50%    heterogenous and calcific         +----------+--------+--------+--------+-------------------------+--------+  +----------+--------+-------+---------+-------------------+  PSV cm/sEDV cmsDescribe Arm Pressure (mmHG) +----------+--------+-------+---------+-------------------+ Subclavian118            Turbulent                    +----------+--------+-------+---------+-------------------+  +---------+--------+--+--------+--+---------+ VertebralPSV cm/s67EDV cm/s23Antegrade +---------+--------+--+--------+--+---------+  Turbulent subclavian artery and innominate artery may indicate a more proximal stenosis.  Left Carotid Findings: +----------+--------+--------+--------+-------------------------+--------+           PSV cm/sEDV cm/sStenosisPlaque Description       Comments +----------+--------+--------+--------+-------------------------+--------+ CCA Prox  88      20      <50%    heterogenous                      +----------+--------+--------+--------+-------------------------+--------+ CCA Mid   80      25      <50%    heterogenous                      +----------+--------+--------+--------+-------------------------+--------+ CCA Distal79      20      <50%    heterogenous                      +----------+--------+--------+--------+-------------------------+--------+ ICA Prox  94      27      1-39%   heterogenous and calcific         +----------+--------+--------+--------+-------------------------+--------+ ICA Mid   64      18                                                +----------+--------+--------+--------+-------------------------+--------+ ICA Distal66      24                                                +----------+--------+--------+--------+-------------------------+--------+ ECA       53      7       <50%    heterogenous and calcific          +----------+--------+--------+--------+-------------------------+--------+  +----------+--------+--------+----------------+-------------------+           PSV cm/sEDV cm/sDescribe        Arm Pressure (mmHG) +----------+--------+--------+----------------+-------------------+ [id number]             Multiphasic, WNL                    +----------+--------+--------+----------------+-------------------+  +---------+--------+--+--------+--+---------+ VertebralPSV cm/s54EDV cm/s17Antegrade +---------+--------+--+--------+--+---------+     Summary: Right Carotid: Velocities in the right ICA are consistent with a 1-39% stenosis.                Non-hemodynamically significant plaque <50% noted in the CCA. The                ECA appears <50% stenosed. Calcifications may obscure more                elevated velocities.                  Turbulent subclavian artery and innominate artery may indicate a                more proximal stenosis.  Left Carotid: Velocities in the left ICA are consistent with a 1-39% stenosis.  Non-hemodynamically significant plaque <50% noted in the CCA. The               ECA appears <50% stenosed. Calcifications may obscure more               elevated velocities.  Vertebrals:  Bilateral vertebral arteries demonstrate antegrade flow. Subclavians: Right subclavian artery flow was disturbed. Normal flow              hemodynamics were seen in the left subclavian artery.     Abdominal Aorta Findings: +-------------+------+----------+---------+----------+--------+----------------+ Location     AP    Trans (cm)PSV      Waveform  ThrombusComments                      (cm)            (cm/s)                                      +-------------+------+----------+---------+----------+--------+----------------+ Supraceliac                                             Obscured by                                                               bowel            +-------------+------+----------+---------+----------+--------+----------------+ Proximal     2.74  2.10      57                                          +-------------+------+----------+---------+----------+--------+----------------+ Mid          1.97  1.76      64                                          +-------------+------+----------+---------+----------+--------+----------------+ Distal       1.71  1.87      53                                          +-------------+------+----------+---------+----------+--------+----------------+ RT CIA Prox  1.3   1.1       15                         At origin        +-------------+------+----------+---------+----------+--------+----------------+ RT CIA Mid                   0                                           +-------------+------+----------+---------+----------+--------+----------------+ RT CIA Distal  Obscured by                                                              bowel            +-------------+------+----------+---------+----------+--------+----------------+ RT EIA Prox                                             Obscured by                                                              bowel            +-------------+------+----------+---------+----------+--------+----------------+ RT EIA Mid                   0                                           +-------------+------+----------+---------+----------+--------+----------------+ RT EIA Distal                38       monophasic                         +-------------+------+----------+---------+----------+--------+----------------+ LT CIA Prox  1.0   1.0       131                                         +-------------+------+----------+---------+----------+--------+----------------+        Summary: Abdominal Aorta: No evidence of an abdominal aortic aneurysm was visualized. The largest aortic measurement is 2.7 cm. Stenosis: +--------------------+--------+ Location            Stenosis +--------------------+--------+ Right Common Iliac  occluded +--------------------+--------+ Right External Iliacoccluded +--------------------+--------+  Unable to discern flow distal to the right common iliac artery origin through the mid right external iliac artery. The distal right external iliac artery demonstrates low velocity monophasic flow.  ASSESSMENT:    PAD: The patient is currently without symptoms.  +--------------------+--------+ Right Common Iliac  occluded +--------------------+--------+ Right External Iliacoccluded +--------------------+--------+   Carotid disease: The patient has a history of stroke.  Right Carotid: Velocities in the right ICA are consistent with a 1-39% stenosis.                Non-hemodynamically significant plaque <50% noted in the CCA. The                ECA appears <50% stenosed. Calcifications may obscure more                elevated velocities.                  Turbulent subclavian artery and innominate artery may indicate a  more proximal stenosis.  Left Carotid: Velocities in the left ICA are consistent with a 1-39% stenosis.               Non-hemodynamically significant plaque <50% noted in the CCA. The               ECA appears <50% stenosed. Calcifications may obscure more               elevated velocities.  Vertebrals:  Bilateral vertebral arteries demonstrate antegrade flow. Subclavians: Right subclavian artery flow was disturbed. Normal flow              hemodynamics were seen in the left subclavian artery.  Abdominal aorta: Abdominal Aorta: No evidence of an abdominal aortic aneurysm was visualized. The largest aortic measurement is 2.7 cm.  PLAN: F/U in 1 year for surveillance of Carotid  stenosis and ABI's.  I explained that they should help him keep a close watch on his feet.  If he were to develop non healing wounds they should call our office.  His Niece agrees with this plan.    Mosetta Pigeon PA-C [facility] [place] Office: [phone]  MD in clinic [place]

## 2019-06-30 ENCOUNTER — Encounter: Payer: Self-pay | Admitting: Nurse Practitioner

## 2019-06-30 ENCOUNTER — Ambulatory Visit: Payer: Medicare Other | Admitting: Nurse Practitioner

## 2019-06-30 ENCOUNTER — Other Ambulatory Visit: Payer: Self-pay

## 2019-06-30 VITALS — BP 122/70 | HR 54 | Temp 97.9°F | Ht 72.0 in | Wt 164.6 lb

## 2019-06-30 DIAGNOSIS — E782 Mixed hyperlipidemia: Secondary | ICD-10-CM | POA: Diagnosis not present

## 2019-06-30 DIAGNOSIS — R7303 Prediabetes: Secondary | ICD-10-CM | POA: Diagnosis not present

## 2019-06-30 DIAGNOSIS — I739 Peripheral vascular disease, unspecified: Secondary | ICD-10-CM | POA: Diagnosis not present

## 2019-06-30 DIAGNOSIS — R9431 Abnormal electrocardiogram [ECG] [EKG]: Secondary | ICD-10-CM

## 2019-06-30 DIAGNOSIS — I10 Essential (primary) hypertension: Secondary | ICD-10-CM

## 2019-06-30 DIAGNOSIS — Z8673 Personal history of transient ischemic attack (TIA), and cerebral infarction without residual deficits: Secondary | ICD-10-CM | POA: Diagnosis not present

## 2019-06-30 DIAGNOSIS — R972 Elevated prostate specific antigen [PSA]: Secondary | ICD-10-CM

## 2019-06-30 DIAGNOSIS — G8190 Hemiplegia, unspecified affecting unspecified side: Secondary | ICD-10-CM

## 2019-06-30 DIAGNOSIS — Z0000 Encounter for general adult medical examination without abnormal findings: Secondary | ICD-10-CM | POA: Diagnosis not present

## 2019-06-30 NOTE — Progress Notes (Signed)
Subjective:     Patient ID: [name] , male    DOB: 07/06/1936 , 83 y.o.   MRN: [id number]   Chief Complaint  Patient presents with  . Hypertension    HPI Men's preventive visit. Patient Health Questionnaire (PHQ-2) is    Clinical Support from 02/17/2019 in [facility]  PHQ-2 Total Score  0    Patient is on a regular diet. Marital status: Single. Relevant history for alcohol use is:  Social History   Substance and Sexual Activity  Alcohol Use Not Currently  . Relevant history for tobacco use is:  Social History   Tobacco Use  Smoking Status Never Smoker  Smokeless Tobacco Never Used    Wt Readings from Last 3 Encounters: 06/30/19 : 164 lb 9.6 oz (74.7 kg) 06/26/19 : 166 lb (75.3 kg) 05/11/19 : 166 lb 11.2 oz (75.6 kg)    Hypertension This is a chronic problem. The current episode started more than 1 year ago. The problem is unchanged. The problem is controlled. Pertinent negatives include no anxiety, blurred vision, chest pain, headaches, malaise/fatigue, palpitations, peripheral edema or shortness of breath.     Past Medical History:  Diagnosis Date  . Hyperlipidemia 2001  . Hypertension 2001  . Stroke [facility])      Family History  Problem Relation Age of Onset  . Stroke Mother   . Stroke Father   . Heart attack Father   . Cancer Brother      Current Outpatient Medications:  .  aspirin EC 81 MG tablet, Take 81 mg by mouth daily., Disp: , Rfl:  .  cetirizine (ZYRTEC) 10 MG tablet, Take 10 mg by mouth as needed for allergies., Disp: , Rfl:  .  clopidogrel (PLAVIX) 75 MG tablet, Take 1 tablet (75 mg total) by mouth daily., Disp: 90 tablet, Rfl: 1 .  diclofenac sodium (VOLTAREN) 1 % GEL, Apply 2 g topically 4 (four) times daily. Rub into affected area of foot 2 to 4 times daily, Disp: 100 g, Rfl: 2 .  diltiazem (CARDIZEM CD) 180 MG 24 hr capsule, TAKE ONE CAPSULE BY MOUTH EVERY DAY, Disp: 90 capsule, Rfl: 0 .  diltiazem (TIAZAC) 180  MG 24 hr capsule, Take 180 mg by mouth daily., Disp: , Rfl:  .  lisinopril-hydrochlorothiazide (ZESTORETIC) 10-12.5 MG tablet, Take 1 tablet by mouth daily., Disp: 90 tablet, Rfl: 1 .  rosuvastatin (CRESTOR) 10 MG tablet, TAKE 1 TABLET BY MOUTH EVERY DAY, Disp: 90 tablet, Rfl: 1   No Known Allergies   Review of Systems  Constitutional: Negative for malaise/fatigue.  HENT: Positive for hearing loss.   Eyes: Negative for blurred vision.  Respiratory: Negative for cough, shortness of breath and wheezing.   Cardiovascular: Negative.  Negative for chest pain, palpitations and leg swelling.  Genitourinary: Negative.   Musculoskeletal: Positive for gait problem.       Right hemiparesis   Neurological: Negative for dizziness and headaches.  Hematological: Negative.   Psychiatric/Behavioral: Negative.      Today's Vitals   06/30/19 1544  BP: 122/70  Pulse: (!) 54  Temp: 97.9 F (36.6 C)  TempSrc: Oral  Weight: 164 lb 9.6 oz (74.7 kg)  Height: 6' (1.829 m)  PainSc: 0-No pain   Body mass index is 22.32 kg/m.   Objective:  Physical Exam Vitals signs reviewed.  Constitutional:      Appearance: Normal appearance.  HENT:     Head: Normocephalic and atraumatic.  Eyes:  Extraocular Movements: Extraocular movements intact.     Conjunctiva/sclera: Conjunctivae normal.     Pupils: Pupils are equal, round, and reactive to light.  Cardiovascular:     Rate and Rhythm: Normal rate and regular rhythm.     Pulses: Normal pulses.     Heart sounds: Normal heart sounds. No murmur.  Pulmonary:     Effort: Pulmonary effort is normal. No respiratory distress.     Breath sounds: Normal breath sounds.  Abdominal:     General: Abdomen is flat. Bowel sounds are normal.     Palpations: Abdomen is soft.  Musculoskeletal:        General: Deformity (right hemiparesis, brace to right lower extremity) present. No swelling or tenderness.     Right lower leg: No edema.     Left lower leg: No edema.   Skin:    General: Skin is warm and dry.     Capillary Refill: Capillary refill takes less than 2 seconds.     Findings: No bruising or erythema.  Neurological:     General: No focal deficit present.     Mental Status: He is alert and oriented to person, place, and time.  Psychiatric:        Mood and Affect: Mood normal.        Behavior: Behavior normal.        Thought Content: Thought content normal.        Judgment: Judgment normal.         Assessment And Plan:     1. Health maintenance examination . Behavior modifications discussed and diet history reviewed.   . Pt will continue to exercise regularly and modify diet with low GI, plant based foods and decrease intake of processed foods.  . Recommend intake of daily multivitamin, Vitamin D, and calcium.  . Recommend for preventive screenings, as well as recommend immunizations that include influenza and TDAP (up to date)  2. PAD (peripheral artery disease) (HCC)  Chronic, continue follow up with Vein and Vascular   Currently stable no ulcers  3. Essential hypertension . B/P is controlled.  . CMP ordered to check renal function.  . The importance of regular exercise and dietary modification was stressed to the patient.  . Sinus Bradycardia incomplete right bundle branch block, HR 50 - this is new for the patient. He is asymptomatic however I will refer him to Cardiology due to his history of stroke, PAD - CMP14+EGFR  4. Mixed hyperlipidemia  Chronic, controlled  Continue with current medications - CMP14+EGFR - Lipid Profile  5. Prediabetes  Chronic, controlled  Continue with current medications  Encouraged to limit intake of sugary foods and drinks  Encouraged to increase physical activity to 150 minutes per week as tolerated (chair exercises)  6. Elevated PSA  Chronic, he is being followed at [facility]  7. Abnormal EKG . Sinus Bradycardia incomplete right bundle branch block, HR 50 - this is new  for the patient. He is asymptomatic however I will refer him to Cardiology due to his history of stroke, PAD - EKG 12-Lead - Ambulatory referral to Cardiology  [name], FNP    THE PATIENT IS ENCOURAGED TO PRACTICE SOCIAL DISTANCING DUE TO THE COVID-19 PANDEMIC.

## 2019-07-01 ENCOUNTER — Ambulatory Visit: Payer: Medicare Other | Admitting: Nurse Practitioner

## 2019-07-01 ENCOUNTER — Ambulatory Visit: Payer: Medicare Other

## 2019-07-01 LAB — CMP14+EGFR
ALT: 53 IU/L — ABNORMAL HIGH (ref 0–44)
AST: 30 IU/L (ref 0–40)
Albumin/Globulin Ratio: 1.7 (ref 1.2–2.2)
Albumin: 4.5 g/dL (ref 3.6–4.6)
Alkaline Phosphatase: 90 IU/L (ref 39–117)
BUN/Creatinine Ratio: 14 (ref 10–24)
BUN: 14 mg/dL (ref 8–27)
Bilirubin Total: 0.9 mg/dL (ref 0.0–1.2)
CO2: 22 mmol/L (ref 20–29)
Calcium: 9.4 mg/dL (ref 8.6–10.2)
Chloride: 99 mmol/L (ref 96–106)
Creatinine, Ser: 0.97 mg/dL (ref 0.76–1.27)
GFR calc Af Amer: 84 mL/min/{1.73_m2} (ref >59)
GFR calc non Af Amer: 73 mL/min/{1.73_m2} (ref >59)
Globulin, Total: 2.7 g/dL (ref 1.5–4.5)
Glucose: 90 mg/dL (ref 65–99)
Potassium: 4.3 mmol/L (ref 3.5–5.2)
Sodium: 136 mmol/L (ref 134–144)
Total Protein: 7.2 g/dL (ref 6.0–8.5)

## 2019-07-01 LAB — HEMOGLOBIN A1C
Est. average glucose Bld gHb Est-mCnc: 126 mg/dL
Hgb A1c MFr Bld: 6 % — ABNORMAL HIGH (ref 4.8–5.6)

## 2019-07-01 LAB — LIPID PANEL
Chol/HDL Ratio: 2.6 ratio (ref 0.0–5.0)
Cholesterol, Total: 157 mg/dL (ref 100–199)
HDL: 61 mg/dL (ref >39)
LDL Chol Calc (NIH): 84 mg/dL (ref 0–99)
Triglycerides: 59 mg/dL (ref 0–149)
VLDL Cholesterol Cal: 12 mg/dL (ref 5–40)

## 2019-07-06 NOTE — Patient Instructions (Signed)
Health Maintenance After Age 81 After age 81, you are at a higher risk for certain long-term diseases and infections as well as injuries from falls. Falls are a major cause of broken bones and head injuries in people who are older than age 81. Getting regular preventive care can help to keep you healthy and well. Preventive care includes getting regular testing and making lifestyle changes as recommended by your health care provider. Talk with your health care provider about:  Which screenings and tests you should have. A screening is a test that checks for a disease when you have no symptoms.  A diet and exercise plan that is right for you. What should I know about screenings and tests to prevent falls? Screening and testing are the best ways to find a health problem early. Early diagnosis and treatment give you the best chance of managing medical conditions that are common after age 81. Certain conditions and lifestyle choices may make you more likely to have a fall. Your health care provider may recommend:  Regular vision checks. Poor vision and conditions such as cataracts can make you more likely to have a fall. If you wear glasses, make sure to get your prescription updated if your vision changes.  Medicine review. Work with your health care provider to regularly review all of the medicines you are taking, including over-the-counter medicines. Ask your health care provider about any side effects that may make you more likely to have a fall. Tell your health care provider if any medicines that you take make you feel dizzy or sleepy.  Osteoporosis screening. Osteoporosis is a condition that causes the bones to get weaker. This can make the bones weak and cause them to break more easily.  Blood pressure screening. Blood pressure changes and medicines to control blood pressure can make you feel dizzy.  Strength and balance checks. Your health care provider may recommend certain tests to check your  strength and balance while standing, walking, or changing positions.  Foot health exam. Foot pain and numbness, as well as not wearing proper footwear, can make you more likely to have a fall.  Depression screening. You may be more likely to have a fall if you have a fear of falling, feel emotionally low, or feel unable to do activities that you used to do.  Alcohol use screening. Using too much alcohol can affect your balance and may make you more likely to have a fall. What actions can I take to lower my risk of falls? General instructions  Talk with your health care provider about your risks for falling. Tell your health care provider if: ? You fall. Be sure to tell your health care provider about all falls, even ones that seem minor. ? You feel dizzy, sleepy, or off-balance.  Take over-the-counter and prescription medicines only as told by your health care provider. These include any supplements.  Eat a healthy diet and maintain a healthy weight. A healthy diet includes low-fat dairy products, low-fat (lean) meats, and fiber from whole grains, beans, and lots of fruits and vegetables. Home safety  Remove any tripping hazards, such as rugs, cords, and clutter.  Install safety equipment such as grab bars in bathrooms and safety rails on stairs.  Keep rooms and walkways well-lit. Activity   Follow a regular exercise program to stay fit. This will help you maintain your balance. Ask your health care provider what types of exercise are appropriate for you.  If you need a cane or   walker, use it as recommended by your health care provider.  Wear supportive shoes that have nonskid soles. Lifestyle  Do not drink alcohol if your health care provider tells you not to drink.  If you drink alcohol, limit how much you have: ? 0-1 drink a day for women. ? 0-2 drinks a day for men.  Be aware of how much alcohol is in your drink. In the [place] one drink equals one typical bottle of beer (12  oz), one-half glass of wine (5 oz), or one shot of hard liquor (1 oz).  Do not use any products that contain nicotine or tobacco, such as cigarettes and e-cigarettes. If you need help quitting, ask your health care provider. Summary  Having a healthy lifestyle and getting preventive care can help to protect your health and wellness after age 81.  Screening and testing are the best way to find a health problem early and help you avoid having a fall. Early diagnosis and treatment give you the best chance for managing medical conditions that are more common for people who are older than age 81.  Falls are a major cause of broken bones and head injuries in people who are older than age 81. Take precautions to prevent a fall at home.  Work with your health care provider to learn what changes you can make to improve your health and wellness and to prevent falls. This information is not intended to replace advice given to you by your health care provider. Make sure you discuss any questions you have with your health care provider. Document Released: 06/05/2017 Document Revised: 11/13/2018 Document Reviewed: 06/05/2017 Elsevier Patient Education  2020 [employer]  

## 2019-07-07 ENCOUNTER — Telehealth: Payer: Self-pay

## 2019-07-21 ENCOUNTER — Ambulatory Visit: Payer: Self-pay

## 2019-07-21 ENCOUNTER — Other Ambulatory Visit: Payer: Self-pay

## 2019-07-21 DIAGNOSIS — I6523 Occlusion and stenosis of bilateral carotid arteries: Secondary | ICD-10-CM

## 2019-07-21 DIAGNOSIS — I739 Peripheral vascular disease, unspecified: Secondary | ICD-10-CM

## 2019-07-21 NOTE — Chronic Care Management (AMB) (Signed)
Chronic Care Management   Social Work Follow Up Note  07/21/2019 Name: [name] MRN: [id number] DOB: 1935-10-25  [name] is a 83 y.o. year old male who is a primary care patient of [name], [place]. The CCM team was consulted for assistance with care coordination.   Review of patient status, including review of consultants reports, other relevant assessments, and collaboration with appropriate care team members and the patient's provider was performed as part of comprehensive patient evaluation and provision of chronic care management services.    SW placed a successful outbound call to the patients nice, [name] to assist with care coordination needs.  Outpatient Encounter Medications as of 07/21/2019  Medication Sig  . aspirin EC 81 MG tablet Take 81 mg by mouth daily.  . cetirizine (ZYRTEC) 10 MG tablet Take 10 mg by mouth as needed for allergies.  [name] clopidogrel (PLAVIX) 75 MG tablet Take 1 tablet (75 mg total) by mouth daily.  . diclofenac sodium (VOLTAREN) 1 % GEL Apply 2 g topically 4 (four) times daily. Rub into affected area of foot 2 to 4 times daily  . diltiazem (CARDIZEM CD) 180 MG 24 hr capsule TAKE ONE CAPSULE BY MOUTH EVERY DAY  . lisinopril-hydrochlorothiazide (ZESTORETIC) 10-12.5 MG tablet Take 1 tablet by mouth daily.  . rosuvastatin (CRESTOR) 10 MG tablet TAKE 1 TABLET BY MOUTH EVERY DAY   No facility-administered encounter medications on file as of 07/21/2019.     Goals Addressed            This Visit's Progress   . "He is having pain and swelling to his right ankle"       Niece stated  Current Barriers:  [name] Knowledge Deficits related to diagnosis and treatment for right ankle pain and swelling   Nurse Case Manager Clinical Goal(s):  [name] Over the next 30 days, patient/niece [name] will verbalize understanding of plan for diagnosis and treatment management for right ankle pain and swelling. . Over the next 30 days, patient will have no  mobility issues related to pain or swelling to his right ankle. . 05/14/2019- Over the next 60 days the patient will work with RN Case Manager to develop a better understanding of recent vascular studies as evidenced by ability to verbalize basic understanding of signs and symptoms of a blood clot  CCM SW Interventions Completed 07/21/2019 with [name] . Outbound call placed to the patients niece and caregiver to assist with care coordination . Determined the patient continues to feel well without signs or symptoms of vascular concerns . Discussed continued plans to monitor condition and hold off on intervention unless adverse symptoms are noted . Collaboration with RN Case Manager regarding above  Patient Self Care Activities:  . Self administers medications as prescribed . Attends all scheduled provider appointments . Performs ADL's independently . Performs IADL's independently  Please see past updates related to this goal by clicking on the "Past Updates" button in the selected goal      . COMPLETED: "He needs to work on his ROM and dexterity"       Niece stated  Current Barriers:  . Impaired Physical Mobility  . Impaired dexterity functionality  Nurse Case Manager Clinical Goal(s):  [name] Over the next 30 days, patient/niece will verbalize understanding of plan for in home PT/OT for HSE and therapy services to help with balance, strengthening and help improve dexterity. Goal Not Met  . 02/04/19 - Over the next 30 days, patient will  have in home PT/OT services in place.  Goal not met . 05/14/2019- Over the next 30 days the patient will have in home PT services in place  CCM SW Interventions: Completed 07/21/19 with niece [name] . Outbound call to the patients niece and caregiver [name] . Discussed patients recent move to be closer to [name] to provide more oversight and care . Reviewed recent OV note to determine the patient has yet to initiate in home  PT . Goal Not Met, no plans to pursue at this time . Collaboration with RN Case Manager regarding goal closure  Patient Self Care Activities:  . Attends all scheduled provider appointments . Calls pharmacy for medication refills . Calls provider office for new concerns or questions  Please see past updates related to this goal by clicking on the "Past Updates" button in the selected goal           Follow Up Plan: No SW follow up planned at this time. The patient will remain active with case management program. RN Case Manager will contact the patient over the next 60 days.  Daneen Schick, BSW, CDP Social Worker, Certified Dementia Practitioner [place] [facility] [place] [facility] [phone]  Total time spent performing care coordination and/or care management activities with the patient by phone or face to face = 9 minutes.

## 2019-07-21 NOTE — Patient Instructions (Signed)
Social Worker Visit Information  Goals we discussed today:  Goals Addressed            This Visit's Progress   . "He is having pain and swelling to his right ankle"       Niece stated  Current Barriers:  [name] Knowledge Deficits related to diagnosis and treatment for right ankle pain and swelling   Nurse Case Manager Clinical Goal(s):  [name] Over the next 30 days, patient/niece [name] will verbalize understanding of plan for diagnosis and treatment management for right ankle pain and swelling. . Over the next 30 days, patient will have no mobility issues related to pain or swelling to his right ankle. . 05/14/2019- Over the next 60 days the patient will work with RN Case Manager to develop a better understanding of recent vascular studies as evidenced by ability to verbalize basic understanding of signs and symptoms of a blood clot  CCM SW Interventions Completed 07/21/2019 with [name] . Outbound call placed to the patients niece and caregiver to assist with care coordination . Determined the patient continues to feel well without signs or symptoms of vascular concerns . Discussed continued plans to monitor condition and hold off on intervention unless adverse symptoms are noted . Collaboration with RN Case Manager regarding above  Patient Self Care Activities:  . Self administers medications as prescribed . Attends all scheduled provider appointments . Performs ADL's independently . Performs IADL's independently  Please see past updates related to this goal by clicking on the "Past Updates" button in the selected goal      . COMPLETED: "He needs to work on his ROM and dexterity"       Niece stated  Current Barriers:  . Impaired Physical Mobility  . Impaired dexterity functionality  Nurse Case Manager Clinical Goal(s):  [name] Over the next 30 days, patient/niece will verbalize understanding of plan for in home PT/OT for HSE and therapy services to help with balance, strengthening  and help improve dexterity. Goal Not Met  . 02/04/19 - Over the next 30 days, patient will have in home PT/OT services in place.  Goal not met . 05/14/2019- Over the next 30 days the patient will have in home PT services in place  CCM SW Interventions: Completed 07/21/19 with niece [name] . Outbound call to the patients niece and caregiver [name] . Discussed patients recent move to be closer to [name] to provide more oversight and care . Reviewed recent OV note to determine the patient has yet to initiate in home PT . Goal Not Met, no plans to pursue at this time . Collaboration with RN Case Manager regarding goal closure  Patient Self Care Activities:  . Attends all scheduled provider appointments . Calls pharmacy for medication refills . Calls provider office for new concerns or questions  Please see past updates related to this goal by clicking on the "Past Updates" button in the selected goal            Follow Up Plan: No SW follow up planned at this time. Please contact me for future resource needs. Barb Merino RN Case Manager will contact you over the next 60 days.   Daneen Schick, BSW, CDP Social Worker, Certified Dementia Practitioner [place] [facility] [place] [facility] [phone]

## 2019-07-30 ENCOUNTER — Other Ambulatory Visit: Payer: Self-pay | Admitting: Nurse Practitioner

## 2019-08-04 ENCOUNTER — Other Ambulatory Visit: Payer: Self-pay | Admitting: Nurse Practitioner

## 2019-08-19 ENCOUNTER — Telehealth: Payer: Self-pay

## 2019-08-20 ENCOUNTER — Ambulatory Visit: Payer: Medicare Other | Admitting: Nurse Practitioner

## 2019-08-28 ENCOUNTER — Telehealth: Payer: Self-pay

## 2019-09-24 ENCOUNTER — Ambulatory Visit: Payer: Self-pay

## 2019-09-24 ENCOUNTER — Telehealth: Payer: Self-pay

## 2019-09-24 DIAGNOSIS — I639 Cerebral infarction, unspecified: Secondary | ICD-10-CM

## 2019-09-24 DIAGNOSIS — I10 Essential (primary) hypertension: Secondary | ICD-10-CM

## 2019-09-24 DIAGNOSIS — R7303 Prediabetes: Secondary | ICD-10-CM

## 2019-09-25 ENCOUNTER — Telehealth: Payer: Self-pay

## 2019-09-25 ENCOUNTER — Other Ambulatory Visit: Payer: Self-pay

## 2019-09-25 NOTE — Chronic Care Management (AMB) (Signed)
  Chronic Care Management   Outreach Note  09/25/2019 Name: [name] MRN: [id number] DOB: [birth year]  Referred by: [name], FNP Reason for referral : Chronic Care Management (FU Call - HTN, CVA, prediabetes)   An unsuccessful telephone outreach was attempted today. The patient was referred to the case management team for assistance with care management and care coordination.   Follow Up Plan: Telephone follow up appointment with care management team member scheduled for: 10/28/19  Delsa Sale, RN, BSN, CCM  Care Management Coordinator [facility]  Direct Phone: [phone]

## 2019-10-07 ENCOUNTER — Telehealth: Payer: Self-pay

## 2019-10-07 ENCOUNTER — Telehealth: Payer: Self-pay | Admitting: Pharmacist

## 2019-10-08 NOTE — Progress Notes (Signed)
Cardiology Office Note:    Date:  10/09/2019   ID:  [name], DOB 1935/10/05, MRN [id number]  PCP:  William Brine, FNP  Cardiologist:  No primary care provider on file.   Referring MD: William Brine, FNP   Chief Complaint  Patient presents with  . Coronary Artery Disease  . Hypertension  . Hyperlipidemia    History of Present Illness:    [name] is a 84 y.o. male with a hx of systemic hypertension, hyperlipidemia, CVA, PAD, Prediabetes, who is referred because of a history of vascular disease and abnormal EKG with right bundle, prominent voltage, and sinus bradycardia.  He is accompanied by his niece, William Lapping, RN.  He lives independently but near family including [name].  He had a stroke many years ago.,  PAD, and was recently seen by William Brine, FNP who referred him because of bradycardia and an right bundle branch block.  He has a history of aortic atherosclerosis.  The patient has no cardiopulmonary complaints. He denies shortness of breath, orthopnea, PND, lower extremity swelling, syncope, and palpitations.  His niece states that he does not complain of any symptoms that are alarming for cardiovascular disease.  Overall, the echo event and has no history of myocardial infarction or heart failure.  Past Medical History:  Diagnosis Date  . Hyperlipidemia 2001  . Hypertension 2001  . Stroke [facility])     History reviewed. No pertinent surgical history.  Current Medications: Current Meds  Medication Sig  . aspirin EC 81 MG tablet Take 81 mg by mouth daily.  . cetirizine (ZYRTEC) 10 MG tablet Take 10 mg by mouth as needed for allergies.  [name] clopidogrel (PLAVIX) 75 MG tablet TAKE 1 TABLET BY MOUTH DAILY  . diclofenac sodium (VOLTAREN) 1 % GEL Apply 2 g topically 4 (four) times daily. Rub into affected area of foot 2 to 4 times daily  . diltiazem (CARDIZEM CD) 180 MG 24 hr capsule TAKE ONE CAPSULE BY MOUTH EVERY DAY  . hydrochlorothiazide (HYDRODIURIL)  12.5 MG tablet TAKE 1 TABLET BY MOUTH ONCE DAILY  . lisinopril (ZESTRIL) 10 MG tablet TAKE 1 TABLET BY MOUTH DAILY  . lisinopril-hydrochlorothiazide (ZESTORETIC) 10-12.5 MG tablet Take 1 tablet by mouth daily.  . rosuvastatin (CRESTOR) 10 MG tablet TAKE 1 TABLET BY MOUTH EVERY DAY  . VITAMIN D PO Take 1 tablet by mouth daily.  [name] zinc gluconate 50 MG tablet Take 50 mg by mouth daily.     Allergies:   Patient has no known allergies.   Social History   Socioeconomic History  . Marital status: Single    Spouse name: Not on file  . Number of children: Not on file  . Years of education: Not on file  . Highest education level: Not on file  Occupational History  . Occupation: retired  Tobacco Use  . Smoking status: Never Smoker  . Smokeless tobacco: Never Used  Substance and Sexual Activity  . Alcohol use: Not Currently  . Drug use: Never  . Sexual activity: Not Currently  Other Topics Concern  . Not on file  Social History Narrative  . Not on file   Social Determinants of Health   Financial Resource Strain:   . Difficulty of Paying Living Expenses: Not on file  Food Insecurity:   . Worried About Charity fundraiser in the Last Year: Not on file  . Ran Out of Food in the Last Year: Not on file  Transportation Needs:   .  Lack of Transportation (Medical): Not on file  . Lack of Transportation (Non-Medical): Not on file  Physical Activity: Inactive  . Days of Exercise per Week: 0 days  . Minutes of Exercise per Session: 0 min  Stress:   . Feeling of Stress : Not on file  Social Connections:   . Frequency of Communication with Friends and Family: Not on file  . Frequency of Social Gatherings with Friends and Family: Not on file  . Attends Religious Services: Not on file  . Active Member of Clubs or Organizations: Not on file  . Attends Banker Meetings: Not on file  . Marital Status: Not on file     Family History: The patient's family history includes Cancer  in his brother; Heart attack in his father; Stroke in his father and mother.  ROS:   Please see the history of present illness.    Again he is hard of hearing.  His medications are being taken as described.  He has not had the COVID-19 vaccine nor has his knees.  [name] states that they are considering it now that she understands it better.  All other systems reviewed and are negative.  EKGs/Labs/Other Studies Reviewed:    The following studies were reviewed today: Carotid Doppler 06/26/2019: Summary:  Right Carotid: Velocities in the right ICA are consistent with a 1-39%  stenosis.         Non-hemodynamically significant plaque <50% noted in the  CCA. The         ECA appears <50% stenosed. Calcifications may obscure more         elevated velocities.           Turbulent subclavian artery and innominate artery may  indicate a         more proximal stenosis.   Left Carotid: Velocities in the left ICA are consistent with a 1-39%  stenosis.        Non-hemodynamically significant plaque <50% noted in the  CCA. The        ECA appears <50% stenosed. Calcifications may obscure more        elevated velocities.   Vertebrals: Bilateral vertebral arteries demonstrate antegrade flow.  Subclavians: Right subclavian artery flow was disturbed. Normal flow        hemodynamics were seen in the left subclavian artery.    Abdominal aortic aneurysm duplex study November 2020: Summary:  Abdominal Aorta: No evidence of an abdominal aortic aneurysm was  visualized. The largest aortic measurement is 2.7 cm.  Stenosis: +--------------------+--------+  Location      Stenosis  +--------------------+--------+  Right Common Iliac occluded  +--------------------+--------+  Right External Iliacoccluded  +--------------------+--------+   Unable to discern flow distal to the right common iliac artery origin    through the mid right external iliac artery. The distal right external  iliac artery demonstrates low velocity monophasic flow.  Lower extremity Doppler study July 2020: Summary:  Right: Resting right ankle-brachial index indicates severe right lower  extremity arterial disease. The right toe-brachial index is abnormal.   Left: Resting left ankle-brachial index indicates mild left lower  extremity arterial disease. The left toe-brachial index is abnormal.   Summary:  Right: Probable total occlusion noted in the external iliac artery,  although proximal segment and mid and distal common iliac artery were not  visualized.  75-99% stenosis noted in the distal superficial femoral artery.   Left: 50-74% stenosis noted in the mid and distal external iliac artery.  Distal occlusion noted in the anterior tibial artery.   EKG:  EKG performed most recently on June 30, 2019 demonstrates right bundle branch block, sinus bradycardia, prominent voltage consistent with left ventricular hypertrophy, and probable septal Q waves and anterolateral leads.  No prior tracing to compare.  Recent Labs: 02/17/2019: Hemoglobin 12.9; Platelets 250 06/30/2019: ALT 53; BUN 14; Creatinine, Ser 0.97; Potassium 4.3; Sodium 136  Recent Lipid Panel    Component Value Date/Time   CHOL 157 06/30/2019 1701   TRIG 59 06/30/2019 1701   HDL 61 06/30/2019 1701   CHOLHDL 2.6 06/30/2019 1701   LDLCALC 84 06/30/2019 1701    Physical Exam:    VS:  BP 112/62   Pulse 60   Ht 6' (1.829 m)   Wt 161 lb 12.8 oz (73.4 kg)   SpO2 98%   BMI 21.94 kg/m     Wt Readings from Last 3 Encounters:  10/09/19 161 lb 12.8 oz (73.4 kg)  06/30/19 164 lb 9.6 oz (74.7 kg)  06/26/19 166 lb (75.3 kg)     GEN: Wears a brace on his right leg and right arm due to prior stroke.. No acute distress HEENT: Very hard of hearing making conversation difficult. NECK: No JVD. LYMPHATICS: No lymphadenopathy CARDIAC:  RRR without murmur,  gallop, or edema. VASCULAR: Difficult to palpate pedal pulses through the patient's socks.. No bruits. RESPIRATORY:  Clear to auscultation without rales, wheezing or rhonchi  ABDOMEN: Soft, non-tender, non-distended, No pulsatile mass, MUSCULOSKELETAL: No deformity  SKIN: Warm and dry NEUROLOGIC:  Alert and oriented x 3.  Marked reduction in hearing PSYCHIATRIC:  Normal affect   ASSESSMENT:    1. PAD (peripheral artery disease) (HCC)   2. Family history of abdominal aortic aneurysm (AAA)   3. Prediabetes   4. Mixed hyperlipidemia   5. Right bundle branch block   6. Cerebrovascular accident (CVA) due to stenosis of carotid artery, unspecified blood vessel laterality (HCC)   7. Essential hypertension   8. Educated about COVID-19 virus infection    PLAN:    In order of problems listed above:  1. Asymptomatic PAD as noted above. 2. No demonstrated abdominal aneurysm on imaging late last year. 3. The hemoglobin A1c when last checked in November was 6.0. 4. The LDL cholesterol was 75 on Crestor 10 mg/day.  This is near the target of 70 and therefore no change in intensity is needed. 5. Conduction system disease with right bundle branch block and sinus bradycardia.  On clinical exam the patient's heart rate is faster today.  Present clinical status is stable and no action is required.  Should A-V conduction abnormality developed, diltiazem should be discontinued and perhaps amlodipine use for blood pressure.  No specific changes recommended at this time. 6. Continue aggressive risk factor modification for both PAD and cerebrovascular disease as outlined below. 7. Blood pressure target 140/80 mmHg or less.  Blood pressure should remain above 110 mmHg given cerebral vascular disease. 8. COVID-19 vaccine is recommended.  He is practicing social distancing/handwashing/mask wearing.  Overall education and awareness concerning secondary vascular disease risk prevention was discussed in detail:  LDL less than 70, hemoglobin A1c less than 7, blood pressure target less than 130/80 mmHg, >150 minutes of moderate aerobic activity per week, avoidance of smoking, weight control (via diet and exercise), and continued surveillance/management of/for obstructive sleep apnea.  In absence of symptoms, cardiology follow-up can be as needed.  His knees, William Picking Rn will ensure that he returns if needed.  She is a Chief Technology Officer for him that she has been a cardiovascular nurse for years.  Medication Adjustments/Labs and Tests Ordered: Current medicines are reviewed at length with the patient today.  Concerns regarding medicines are outlined above.  No orders of the defined types were placed in this encounter.  No orders of the defined types were placed in this encounter.   Patient Instructions  Medication Instructions:  Your physician recommends that you continue on your current medications as directed. Please refer to the Current Medication list given to you today.  *If you need a refill on your cardiac medications before your next appointment, please call your pharmacy*   Lab Work: None If you have labs (blood work) drawn today and your tests are completely normal, you will receive your results only by: [name] MyChart Message (if you have MyChart) OR . A paper copy in the mail If you have any lab test that is abnormal or we need to change your treatment, we will call you to review the results.   Testing/Procedures: None   Follow-Up: At [facility], you and your health needs are our priority.  As part of our continuing mission to provide you with exceptional heart care, we have created designated Provider Care Teams.  These Care Teams include your primary Cardiologist (physician) and Advanced Practice Providers (APPs -  Physician Assistants and Nurse Practitioners) who all work together to provide you with the care you need, when you need it.  We recommend signing up for the patient  portal called "MyChart".  Sign up information is provided on this After Visit Summary.  MyChart is used to connect with patients for Virtual Visits (Telemedicine).  Patients are able to view lab/test results, encounter notes, upcoming appointments, etc.  Non-urgent messages can be sent to your provider as well.   To learn more about what you can do with MyChart, go to [email or web address].    Your next appointment:   As needed  The format for your next appointment:   In Person  Provider:   You may see Dr. Verdis Prime or one of the following Advanced Practice Providers on your designated Care Team:    Norma Fredrickson, NP  Nada Boozer, NP  Georgie Chard, NP    Other Instructions      Signed, Lesleigh Noe, MD  10/09/2019 1:10 PM    [facility]

## 2019-10-09 ENCOUNTER — Encounter: Payer: Self-pay | Admitting: Interventional Cardiology

## 2019-10-09 ENCOUNTER — Ambulatory Visit (INDEPENDENT_AMBULATORY_CARE_PROVIDER_SITE_OTHER): Payer: Medicare PPO | Admitting: Interventional Cardiology

## 2019-10-09 ENCOUNTER — Telehealth: Payer: Self-pay | Admitting: Interventional Cardiology

## 2019-10-09 ENCOUNTER — Other Ambulatory Visit: Payer: Self-pay

## 2019-10-09 VITALS — BP 112/62 | HR 60 | Ht 72.0 in | Wt 158.2 lb

## 2019-10-09 DIAGNOSIS — Z7189 Other specified counseling: Secondary | ICD-10-CM

## 2019-10-09 DIAGNOSIS — I739 Peripheral vascular disease, unspecified: Secondary | ICD-10-CM

## 2019-10-09 DIAGNOSIS — I10 Essential (primary) hypertension: Secondary | ICD-10-CM

## 2019-10-09 DIAGNOSIS — R7303 Prediabetes: Secondary | ICD-10-CM | POA: Diagnosis not present

## 2019-10-09 DIAGNOSIS — I4510 Unspecified right bundle-branch block: Secondary | ICD-10-CM

## 2019-10-09 DIAGNOSIS — I63239 Cerebral infarction due to unspecified occlusion or stenosis of unspecified carotid arteries: Secondary | ICD-10-CM

## 2019-10-09 DIAGNOSIS — E782 Mixed hyperlipidemia: Secondary | ICD-10-CM | POA: Diagnosis not present

## 2019-10-09 DIAGNOSIS — Z8249 Family history of ischemic heart disease and other diseases of the circulatory system: Secondary | ICD-10-CM

## 2019-10-09 NOTE — Telephone Encounter (Signed)
Niece requested that pt be weighed again when leaving the office so they could get a weight without his coat on.  Wt was 158lbs and 4 ozs.  Weight updated in system.

## 2019-10-09 NOTE — Telephone Encounter (Signed)
New Message  Patient's niece is calling in to get patient's weight corrected in the system. States that the second weight that was taken in office was 158.4. Patient's niece would like that weight corrected in the system. Please assist.

## 2019-10-09 NOTE — Patient Instructions (Signed)

## 2019-10-28 ENCOUNTER — Telehealth: Payer: Self-pay

## 2019-10-31 ENCOUNTER — Other Ambulatory Visit: Payer: Self-pay | Admitting: Internal Medicine

## 2019-11-03 ENCOUNTER — Emergency Department (HOSPITAL_COMMUNITY): Payer: Medicare PPO

## 2019-11-03 ENCOUNTER — Observation Stay (HOSPITAL_COMMUNITY)
Admission: EM | Admit: 2019-11-03 | Discharge: 2019-11-05 | Disposition: A | Discharge status: Home / Self Care | Payer: Medicare PPO | Attending: Internal Medicine | Admitting: Internal Medicine

## 2019-11-03 ENCOUNTER — Encounter (HOSPITAL_COMMUNITY): Payer: Self-pay | Admitting: Emergency Medicine

## 2019-11-03 ENCOUNTER — Observation Stay (HOSPITAL_BASED_OUTPATIENT_CLINIC_OR_DEPARTMENT_OTHER): Payer: Medicare PPO

## 2019-11-03 DIAGNOSIS — R0609 Other forms of dyspnea: Secondary | ICD-10-CM | POA: Insufficient documentation

## 2019-11-03 DIAGNOSIS — I739 Peripheral vascular disease, unspecified: Secondary | ICD-10-CM | POA: Diagnosis not present

## 2019-11-03 DIAGNOSIS — I7389 Other specified peripheral vascular diseases: Secondary | ICD-10-CM | POA: Diagnosis not present

## 2019-11-03 DIAGNOSIS — Z7902 Long term (current) use of antithrombotics/antiplatelets: Secondary | ICD-10-CM | POA: Diagnosis not present

## 2019-11-03 DIAGNOSIS — I6930 Unspecified sequelae of cerebral infarction: Secondary | ICD-10-CM

## 2019-11-03 DIAGNOSIS — M79601 Pain in right arm: Secondary | ICD-10-CM | POA: Diagnosis present

## 2019-11-03 DIAGNOSIS — M25552 Pain in left hip: Secondary | ICD-10-CM | POA: Insufficient documentation

## 2019-11-03 DIAGNOSIS — Z7901 Long term (current) use of anticoagulants: Secondary | ICD-10-CM | POA: Insufficient documentation

## 2019-11-03 DIAGNOSIS — Z79899 Other long term (current) drug therapy: Secondary | ICD-10-CM | POA: Insufficient documentation

## 2019-11-03 DIAGNOSIS — M79622 Pain in left upper arm: Secondary | ICD-10-CM | POA: Diagnosis not present

## 2019-11-03 DIAGNOSIS — R7303 Prediabetes: Secondary | ICD-10-CM | POA: Diagnosis not present

## 2019-11-03 DIAGNOSIS — R42 Dizziness and giddiness: Secondary | ICD-10-CM | POA: Diagnosis not present

## 2019-11-03 DIAGNOSIS — I10 Essential (primary) hypertension: Secondary | ICD-10-CM | POA: Insufficient documentation

## 2019-11-03 DIAGNOSIS — R778 Other specified abnormalities of plasma proteins: Secondary | ICD-10-CM

## 2019-11-03 DIAGNOSIS — W19XXXA Unspecified fall, initial encounter: Secondary | ICD-10-CM

## 2019-11-03 DIAGNOSIS — R748 Abnormal levels of other serum enzymes: Principal | ICD-10-CM | POA: Diagnosis present

## 2019-11-03 DIAGNOSIS — E785 Hyperlipidemia, unspecified: Secondary | ICD-10-CM | POA: Insufficient documentation

## 2019-11-03 DIAGNOSIS — I69959 Hemiplegia and hemiparesis following unspecified cerebrovascular disease affecting unspecified side: Secondary | ICD-10-CM | POA: Insufficient documentation

## 2019-11-03 DIAGNOSIS — Z20822 Contact with and (suspected) exposure to covid-19: Secondary | ICD-10-CM | POA: Insufficient documentation

## 2019-11-03 DIAGNOSIS — R0600 Dyspnea, unspecified: Secondary | ICD-10-CM

## 2019-11-03 DIAGNOSIS — Z23 Encounter for immunization: Secondary | ICD-10-CM | POA: Insufficient documentation

## 2019-11-03 DIAGNOSIS — Y92009 Unspecified place in unspecified non-institutional (private) residence as the place of occurrence of the external cause: Secondary | ICD-10-CM

## 2019-11-03 DIAGNOSIS — T1490XA Injury, unspecified, initial encounter: Secondary | ICD-10-CM

## 2019-11-03 HISTORY — DX: Unspecified osteoarthritis, unspecified site: M19.90

## 2019-11-03 HISTORY — DX: Nonspecific intraventricular block: I45.4

## 2019-11-03 HISTORY — DX: Peripheral vascular disease, unspecified: I73.9

## 2019-11-03 HISTORY — DX: Pure hypercholesterolemia, unspecified: E78.00

## 2019-11-03 LAB — URINALYSIS, ROUTINE W REFLEX MICROSCOPIC
Bilirubin Urine: NEGATIVE
Glucose, UA: NEGATIVE mg/dL
Hgb urine dipstick: NEGATIVE
Ketones, ur: NEGATIVE mg/dL
Leukocytes,Ua: NEGATIVE
Nitrite: NEGATIVE
Protein, ur: NEGATIVE mg/dL
Specific Gravity, Urine: 1.045 — ABNORMAL HIGH (ref 1.005–1.030)
pH: 5 (ref 5.0–8.0)

## 2019-11-03 LAB — COMPREHENSIVE METABOLIC PANEL
ALT: 41 U/L (ref 0–44)
AST: 37 U/L (ref 15–41)
Albumin: 3.8 g/dL (ref 3.5–5.0)
Alkaline Phosphatase: 72 U/L (ref 38–126)
Anion gap: 13 (ref 5–15)
BUN: 15 mg/dL (ref 8–23)
CO2: 22 mmol/L (ref 22–32)
Calcium: 9.1 mg/dL (ref 8.9–10.3)
Chloride: 99 mmol/L (ref 98–111)
Creatinine, Ser: 0.98 mg/dL (ref 0.61–1.24)
GFR calc Af Amer: >60 mL/min (ref >60)
GFR calc non Af Amer: >60 mL/min (ref >60)
Glucose, Bld: 143 mg/dL — ABNORMAL HIGH (ref 70–99)
Potassium: 3.5 mmol/L (ref 3.5–5.1)
Sodium: 134 mmol/L — ABNORMAL LOW (ref 135–145)
Total Bilirubin: 1.3 mg/dL — ABNORMAL HIGH (ref 0.3–1.2)
Total Protein: 6.2 g/dL — ABNORMAL LOW (ref 6.5–8.1)

## 2019-11-03 LAB — TSH: TSH: 0.55 u[IU]/mL (ref 0.350–4.500)

## 2019-11-03 LAB — I-STAT CHEM 8, ED
BUN: 16 mg/dL (ref 8–23)
Calcium, Ion: 1.15 mmol/L (ref 1.15–1.40)
Chloride: 99 mmol/L (ref 98–111)
Creatinine, Ser: 0.9 mg/dL (ref 0.61–1.24)
Glucose, Bld: 139 mg/dL — ABNORMAL HIGH (ref 70–99)
HCT: 39 % (ref 39.0–52.0)
Hemoglobin: 13.3 g/dL (ref 13.0–17.0)
Potassium: 3.5 mmol/L (ref 3.5–5.1)
Sodium: 135 mmol/L (ref 135–145)
TCO2: 24 mmol/L (ref 22–32)

## 2019-11-03 LAB — ETHANOL: Alcohol, Ethyl (B): <10 mg/dL (ref <10)

## 2019-11-03 LAB — CBC
HCT: 36.7 % — ABNORMAL LOW (ref 39.0–52.0)
Hemoglobin: 12.6 g/dL — ABNORMAL LOW (ref 13.0–17.0)
MCH: 32.6 pg (ref 26.0–34.0)
MCHC: 34.3 g/dL (ref 30.0–36.0)
MCV: 95.1 fL (ref 80.0–100.0)
Platelets: 260 10*3/uL (ref 150–400)
RBC: 3.86 MIL/uL — ABNORMAL LOW (ref 4.22–5.81)
RDW: 15.8 % — ABNORMAL HIGH (ref 11.5–15.5)
WBC: 18.5 10*3/uL — ABNORMAL HIGH (ref 4.0–10.5)
nRBC: 0 % (ref 0.0–0.2)

## 2019-11-03 LAB — ECHOCARDIOGRAM COMPLETE
Height: 72 in
Weight: 2880 oz

## 2019-11-03 LAB — CK: Total CK: 416 U/L — ABNORMAL HIGH (ref 49–397)

## 2019-11-03 LAB — TROPONIN I (HIGH SENSITIVITY)
Troponin I (High Sensitivity): 387 ng/L (ref <18)
Troponin I (High Sensitivity): 420 ng/L (ref <18)
Troponin I (High Sensitivity): 402 ng/L (ref <18)

## 2019-11-03 LAB — SARS CORONAVIRUS 2 (TAT 6-24 HRS): SARS Coronavirus 2: NEGATIVE

## 2019-11-03 LAB — BRAIN NATRIURETIC PEPTIDE: B Natriuretic Peptide: 38.5 pg/mL (ref 0.0–100.0)

## 2019-11-03 LAB — CBG MONITORING, ED
Glucose-Capillary: 137 mg/dL — ABNORMAL HIGH (ref 70–99)
Glucose-Capillary: 130 mg/dL — ABNORMAL HIGH (ref 70–99)

## 2019-11-03 LAB — PROTIME-INR
INR: 1.1 (ref 0.8–1.2)
Prothrombin Time: 14.4 seconds (ref 11.4–15.2)

## 2019-11-03 MED ORDER — ALBUTEROL SULFATE (2.5 MG/3ML) 0.083% IN NEBU: 2.5000 mg | INHALATION_SOLUTION | Freq: Four times a day (QID) | RESPIRATORY_TRACT | Status: DC | PRN

## 2019-11-03 MED ORDER — HEPARIN BOLUS VIA INFUSION
4000.0000 [IU] | Freq: Once | INTRAVENOUS | Status: AC
  Administered 2019-11-03: 4000 [IU] via INTRAVENOUS
  Filled 2019-11-03: qty 4000

## 2019-11-03 MED ORDER — TETANUS-DIPHTH-ACELL PERTUSSIS 5-2.5-18.5 LF-MCG/0.5 IM SUSP
0.5000 mL | Freq: Once | INTRAMUSCULAR | Status: AC
  Administered 2019-11-03: 0.5 mL via INTRAMUSCULAR
  Filled 2019-11-03: qty 0.5

## 2019-11-03 MED ORDER — ONDANSETRON HCL 4 MG PO TABS: 4.0000 mg | ORAL_TABLET | Freq: Four times a day (QID) | ORAL | Status: DC | PRN

## 2019-11-03 MED ORDER — FENTANYL CITRATE (PF) 100 MCG/2ML IJ SOLN
50.0000 ug | Freq: Once | INTRAMUSCULAR | Status: AC
  Administered 2019-11-03: 50 ug via INTRAVENOUS
  Filled 2019-11-03: qty 2

## 2019-11-03 MED ORDER — LISINOPRIL 10 MG PO TABS
10.0000 mg | ORAL_TABLET | Freq: Every day | ORAL | Status: DC
  Administered 2019-11-03 – 2019-11-05 (×3): 10 mg via ORAL
  Filled 2019-11-03 (×3): qty 1

## 2019-11-03 MED ORDER — IOHEXOL 300 MG/ML  SOLN
100.0000 mL | Freq: Once | INTRAMUSCULAR | Status: AC | PRN
  Administered 2019-11-03: 100 mL via INTRAVENOUS

## 2019-11-03 MED ORDER — HEPARIN (PORCINE) 25000 UT/250ML-% IV SOLN
1000.0000 [IU]/h | INTRAVENOUS | Status: DC
  Administered 2019-11-03: 1000 [IU]/h via INTRAVENOUS
  Filled 2019-11-03: qty 250

## 2019-11-03 MED ORDER — CLOPIDOGREL BISULFATE 75 MG PO TABS
75.0000 mg | ORAL_TABLET | Freq: Every day | ORAL | Status: DC
  Administered 2019-11-03 – 2019-11-05 (×3): 75 mg via ORAL
  Filled 2019-11-03 (×3): qty 1

## 2019-11-03 MED ORDER — ONDANSETRON HCL 4 MG/2ML IJ SOLN: 4.0000 mg | Freq: Four times a day (QID) | INTRAMUSCULAR | Status: DC | PRN

## 2019-11-03 MED ORDER — ACETAMINOPHEN 325 MG PO TABS
650.0000 mg | ORAL_TABLET | Freq: Four times a day (QID) | ORAL | Status: DC | PRN
  Administered 2019-11-03 – 2019-11-05 (×4): 650 mg via ORAL
  Filled 2019-11-03 (×4): qty 2

## 2019-11-03 MED ORDER — DILTIAZEM HCL ER COATED BEADS 180 MG PO CP24
180.0000 mg | ORAL_CAPSULE | Freq: Every day | ORAL | Status: DC
  Administered 2019-11-03 – 2019-11-05 (×3): 180 mg via ORAL
  Filled 2019-11-03 (×3): qty 1

## 2019-11-03 MED ORDER — SODIUM CHLORIDE 0.9 % IV BOLUS
500.0000 mL | Freq: Once | INTRAVENOUS | Status: AC
  Administered 2019-11-03: 500 mL via INTRAVENOUS

## 2019-11-03 MED ORDER — SODIUM CHLORIDE 0.9% FLUSH
3.0000 mL | Freq: Two times a day (BID) | INTRAVENOUS | Status: DC
  Administered 2019-11-04: 3 mL via INTRAVENOUS

## 2019-11-03 MED ORDER — ACETAMINOPHEN 650 MG RE SUPP: 650.0000 mg | Freq: Four times a day (QID) | RECTAL | Status: DC | PRN

## 2019-11-03 MED ORDER — ROSUVASTATIN CALCIUM 5 MG PO TABS
10.0000 mg | ORAL_TABLET | Freq: Every day | ORAL | Status: DC
  Administered 2019-11-04 – 2019-11-05 (×2): 10 mg via ORAL
  Filled 2019-11-03 (×2): qty 2

## 2019-11-03 NOTE — ED Notes (Signed)
Ambulated pt again, almost 15 ft with standby assist. After sitting on bed, pt had nausea and dizziness episode again. VSS and normotensive

## 2019-11-03 NOTE — ED Provider Notes (Signed)
7:42 AM BP 114/60   Pulse 63   Temp (!) 97.5 F (36.4 C) (Axillary)   Resp 14   Ht 6' (1.829 m)   Wt 81.6 kg   SpO2 99%   BMI 24.41 kg/m   Mechanical fall. Expect d/c after fluids. Plan ambulate after fluids.   8:36 AM BP 140/74   Pulse 62   Temp (!) 97.5 F (36.4 C) (Axillary)   Resp 14   Ht 6' (1.829 m)   Wt 81.6 kg   SpO2 97%   BMI 24.41 kg/m  Patient has received fluids, attempted ambulation twice in the ED. After each ambulation patient became very nauseous and diaphoretic and had to stop.  Patient recently seen be Dr. Garnette Scheuermann due to new LBBB and bradycardia, neither seen on today's EKG or cardiac monitor. ? ACS v. HF. Patient will need admission for cardiac work up. I have added on a troponin and BNP. Patient  Moved here, lives alone and a few houses down from family. Patient PE shows bruises in various states of healing- suspect that he is falling more frequently. Patient also unreliable in hx and has given multiple versions of the same story.   Patient troponin elevated and will be admitted to the hospital.   Arthor Captain, PA-C 11/05/19 [id number]    Terrilee Files, MD 11/05/19 [phone]

## 2019-11-03 NOTE — Progress Notes (Signed)
Orthopedic Tech Progress Note Patient Details:  [name] March 06, 1936 [id number] Level 2 trauma  Patient ID: [name], male   DOB: 01-18-1936, 84 y.o.   MRN: [id number]   [name] 11/03/2019, 3:57 AM

## 2019-11-03 NOTE — ED Notes (Signed)
[name] niece [phone] looking for an update on the pt

## 2019-11-03 NOTE — H&P (Addendum)
History and Physical    [name] ZOX:[id number] DOB: Nov 06, 1935 DOA: 11/03/2019  Referring MD/NP/PA: Arthor Captain, PA-C PCP: Patient, No Pcp Per  Patient coming from: Home (lives alone) via EMS  Chief Complaint: fall  I have personally briefly reviewed patient's old medical records in [facility]   HPI: [name] is a 84 y.o. 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  He lives alone, but his niece lives next door to help look after him.  At baseline he ambulates with the use of a cane, but also has a motorized wheelchair.  He has braces of his right arm and foot due to the previous stroke.  Since moving into his new house 3 months ago that has hardwood floors instead of carpet he is fallen 2 other times onto the bed.  Patient reports that he had gotten up from his recliner around 7 PM, and slipped falling into the TV console.  He laid on the floor, but was able to get to a phone around 1:30 AM this morning to call for help.  Patient does easily bruise as he is on Plavix.  The only other associated symptoms include complaints of nausea.  Denies having any chest pain, palpitations, shortness of breath, cough, abdominal pain, or diarrhea.  Recently evaluated by Dr. Katrinka Blazing of cardiology for new right bundle branch block.  ED Course: Upon admission into the emergency department patient was seen as a level 2 trauma and was pan scanned.  It did reveal signs of contusions of the shoulders and right hip without any acute fractures.  Patient was also noted to have signs of aortic and coronary atherosclerotic disease.  Orthostatic vital signs were noted to be within normal limits.  Labs significant for WBC 18.5, hemoglobin 12.6, RDW 15.8, glucose 143, CK 416, troponin 387, and BNP 38.5.  CT he had been given fentanyl, 500 mL of normal saline, and Tdap booster.  TRH called to  admit.  Review of Systems  Constitutional: Negative for fever.  HENT: Positive for hearing loss.   Eyes: Negative for photophobia and pain.  Respiratory: Negative for hemoptysis and shortness of breath.   Cardiovascular: Negative for chest pain and leg swelling.  Gastrointestinal: Positive for nausea. Negative for vomiting.  Genitourinary: Negative for dysuria and hematuria.  Musculoskeletal: Positive for falls and joint pain.  Neurological: Positive for focal weakness. Negative for loss of consciousness.  Endo/Heme/Allergies: Bruises/bleeds easily.  Psychiatric/Behavioral: Negative for substance abuse.    Past Medical History:  Diagnosis Date  . Arthritis   . Bundle branch block   . Hypercholesteremia   . Hypertension   . Peripheral vascular disease (HCC)   . Stroke [facility])    2010 - R side deficits    History reviewed. No pertinent surgical history.   reports that he has never smoked. He has never used smokeless tobacco. He reports that he does not drink alcohol or use drugs.  No Known Allergies  No family history on file.  Prior to Admission medications   Medication Sig Start Date End Date Taking? Authorizing Provider  Cholecalciferol (VITAMIN D) 50 MCG (2000 UT) tablet Take 2,000 Units by mouth daily.   Yes [provider]  clopidogrel (PLAVIX) 75 MG tablet Take 75 mg by mouth daily.   Yes [provider]  diltiazem (CARDIZEM CD) 180 MG 24 hr capsule Take 180 mg by mouth daily.  10/30/19  Yes [provider]  hydrochlorothiazide (HYDRODIURIL) 12.5 MG tablet Take 12.5 mg by mouth daily. 10/28/19  Yes [provider]  lisinopril (ZESTRIL) 10 MG tablet Take 10 mg by mouth daily.   Yes [provider]  rosuvastatin (CRESTOR) 10 MG tablet Take 10 mg by mouth daily.   Yes [provider]  zinc gluconate 50 MG tablet Take 50 mg by mouth daily.   Yes [provider]    Physical Exam:  Constitutional: Elderly male in  NAD, calm, comfortable Vitals:   11/03/19 0835 11/03/19 0836 11/03/19 0837 11/03/19 0838  BP:      Pulse: 70 63 64 65  Resp: 20 16 17 16   Temp:      TempSrc:      SpO2: 97% 97% 97% 98%  Weight:      Height:       Eyes: PERRL, lids and conjunctivae normal ENMT: Mucous membranes are moist. Posterior pharynx clear of any exudate or lesions.  Hard of hearing. Neck: normal, supple, no masses, no thyromegaly Respiratory: clear to auscultation bilaterally, no wheezing, no crackles. Normal respiratory effort. No accessory muscle use.  Cardiovascular: Regular rate and rhythm, no murmurs / rubs / gallops. No extremity edema. 2+ pedal pulses. No carotid bruits.  Abdomen: no tenderness, no masses palpated. No hepatosplenomegaly. Bowel sounds positive.  Musculoskeletal: no clubbing / cyanosis.  Contractures noted of the right upper extremity Skin: Bruising noted of the shoulders, right hip, and old bruising of the left flank.  Neurologic: CN 2-12 grossly intact.   Strength 5/5 on the left upper and lower extremity with right-sided hemiparesis. Psychiatric: Normal judgment and insight. Alert and oriented x 3. Normal mood.     Labs on Admission: I have personally reviewed following labs and imaging studies  CBC: Recent Labs  Lab 11/03/19 0406 11/03/19 0415  WBC 18.5*  --   HGB 12.6* 13.3  HCT 36.7* 39.0  MCV 95.1  --   PLT 260  --    Basic Metabolic Panel: Recent Labs  Lab 11/03/19 0406 11/03/19 0415  NA 134* 135  K 3.5 3.5  CL 99 99  CO2 22  --   GLUCOSE 143* 139*  BUN 15 16  CREATININE 0.98 0.90  CALCIUM 9.1  --    GFR: Estimated Creatinine Clearance: 69.5 mL/min (by C-G formula based on SCr of 0.9 mg/dL). Liver Function Tests: Recent Labs  Lab 11/03/19 0406  AST 37  ALT 41  ALKPHOS 72  BILITOT 1.3*  PROT 6.2*  ALBUMIN 3.8   No results for input(s): LIPASE, AMYLASE in the last 168 hours. No results for input(s): AMMONIA in the last 168 hours. Coagulation  Profile: Recent Labs  Lab 11/03/19 0406  INR 1.1   Cardiac Enzymes: Recent Labs  Lab 11/03/19 0406  CKTOTAL 416*   BNP (last 3 results) No results for input(s): PROBNP in the last 8760 hours. HbA1C: No results for input(s): HGBA1C in the last 72 hours. CBG: Recent Labs  Lab 11/03/19 0357 11/03/19 0712  GLUCAP 137* 130*   Lipid Profile: No results for input(s): CHOL, HDL, LDLCALC, TRIG, CHOLHDL, LDLDIRECT in the last 72 hours. Thyroid Function Tests: No results for input(s): TSH, T4TOTAL, FREET4, T3FREE, THYROIDAB in the last 72 hours. Anemia Panel: No results for input(s): VITAMINB12, FOLATE, FERRITIN, TIBC, IRON, RETICCTPCT in the last 72 hours. Urine analysis:    Component Value Date/Time   COLORURINE YELLOW 11/03/2019 0611   APPEARANCEUR CLEAR 11/03/2019 4801  LABSPEC 1.045 (H) 11/03/2019 0611   PHURINE 5.0 11/03/2019 0611   GLUCOSEU NEGATIVE 11/03/2019 0611   HGBUR NEGATIVE 11/03/2019 0611   BILIRUBINUR NEGATIVE 11/03/2019 0611   KETONESUR NEGATIVE 11/03/2019 0611   PROTEINUR NEGATIVE 11/03/2019 0611   NITRITE NEGATIVE 11/03/2019 0611   LEUKOCYTESUR NEGATIVE 11/03/2019 0611   Sepsis Labs: No results found for this or any previous visit (from the past 240 hour(s)).   Radiological Exams on Admission: DG Forearm Right  Result Date: 11/03/2019 CLINICAL DATA:  Fall with forearm laceration EXAM: RIGHT FOREARM - 2 VIEW COMPARISON:  None. FINDINGS: Prominent forearm swelling with soft tissue gas that is deep and adjacent to a skin irregularity. No acute fracture. No opaque foreign body. Prominent osteopenia. IMPRESSION: Soft tissue swelling and deep soft tissue gas. No opaque foreign body or fracture. Electronically Signed   By: Monte Fantasia M.D.   On: 11/03/2019 04:12   CT HEAD WO CONTRAST  Result Date: 11/03/2019 CLINICAL DATA:  Head trauma and headache EXAM: CT HEAD WITHOUT CONTRAST CT CERVICAL SPINE WITHOUT CONTRAST TECHNIQUE: Multidetector CT imaging of the  head and cervical spine was performed following the standard protocol without intravenous contrast. Multiplanar CT image reconstructions of the cervical spine were also generated. COMPARISON:  None available FINDINGS: CT HEAD FINDINGS Brain: No evidence of acute infarction, hemorrhage, hydrocephalus, extra-axial collection or mass lesion/mass effect. Generalized atrophy. Vascular: Atherosclerotic calcification. Skull: Normal. Negative for fracture or focal lesion. Prominent TMJ degenerative spurring. Sinuses/Orbits: No evidence of injury. CT CERVICAL SPINE FINDINGS Alignment: Reversal of cervical lordosis with slight C4-5 anterolisthesis and C5-6 retrolisthesis Skull base and vertebrae: No acute fracture. Sclerosis on both sides of the C5-6 disc space attributed to degeneration. Soft tissues and spinal canal: Subcutaneous stranding seen posterior to the left shoulder and the lower neck. Disc levels: Advanced and bulky degenerative facet spurring with C3-4 facet ankylosis. Advanced lower cervical degenerative disc narrowing and ridging with foraminal impingement. C3-4 ACDF with solid arthrodesis. Upper chest: No acute finding IMPRESSION: Head CT: No evidence of intracranial injury. Cervical spine CT 1. Subcutaneous contusions posterior to the left shoulder and lower cervical spine. 2. No acute fracture. 3. Advanced degenerative disease. Electronically Signed   By: Monte Fantasia M.D.   On: 11/03/2019 05:05   CT CHEST W CONTRAST  Result Date: 11/03/2019 CLINICAL DATA:  Level 2 trauma due to fall. EXAM: CT CHEST, ABDOMEN, AND PELVIS WITH CONTRAST TECHNIQUE: Multidetector CT imaging of the chest, abdomen and pelvis was performed following the standard protocol during bolus administration of intravenous contrast. CONTRAST:  171mL OMNIPAQUE IOHEXOL 300 MG/ML  SOLN COMPARISON:  None. FINDINGS: CT CHEST FINDINGS Cardiovascular: Normal heart size. No pericardial effusion. No evidence of great vessel injury. Multifocal  aortic and coronary atherosclerosis. Streak artifact affects pulmonary arteries with no convincing filling defect. Mediastinum/Nodes: No evidence of mass or adenopathy. Lungs/Pleura: No evidence of hemothorax, pneumothorax, or lung contusion. Subpleural nodules along the right minor fissure attributed to lymph nodes given location and shape. Mild dependent atelectasis Musculoskeletal: Soft tissue injury to the right forearm with swelling and deep space gas, known from radiography. Right shoulder girdle musculature is atrophic compared to the left. Subcutaneous contusion posteriorly to both scapulae. No acute fracture. CT ABDOMEN PELVIS FINDINGS Hepatobiliary: No hepatic injury or perihepatic hematoma. Gallbladder is unremarkable Pancreas: Negative Spleen: No splenic injury or perisplenic hematoma. Adrenals/Urinary Tract: No adrenal hemorrhage or renal injury identified. Multiple bilateral renal cysts with simple density. The largest emanates from the interpolar left kidney at 8.5  cm. Uncomplicated appearing diverticulum extending leftward from the bladder. Stomach/Bowel: No evidence of injury.  Diffuse formed stool Vascular/Lymphatic: Atherosclerosis with chronic occlusion of the right common iliac artery. The distal right external iliac reconstitutes. Reproductive: Negative Other: No evidence of ascites or pneumoperitoneum Musculoskeletal: Subcutaneous reticulation lateral to the right hip. No associated hip fracture. IMPRESSION: 1. No evidence of intrathoracic or intra-abdominal injury. 2. Contusions posterior to the shoulders and lateral to the right hip. There is prominent swelling with soft tissue gas at the right forearm. No acute fracture. 3. Chronic and incidental findings noted above. Electronically Signed   By: Marnee Spring M.D.   On: 11/03/2019 05:19   CT CERVICAL SPINE WO CONTRAST  Result Date: 11/03/2019 CLINICAL DATA:  Head trauma and headache EXAM: CT HEAD WITHOUT CONTRAST CT CERVICAL SPINE  WITHOUT CONTRAST TECHNIQUE: Multidetector CT imaging of the head and cervical spine was performed following the standard protocol without intravenous contrast. Multiplanar CT image reconstructions of the cervical spine were also generated. COMPARISON:  None available FINDINGS: CT HEAD FINDINGS Brain: No evidence of acute infarction, hemorrhage, hydrocephalus, extra-axial collection or mass lesion/mass effect. Generalized atrophy. Vascular: Atherosclerotic calcification. Skull: Normal. Negative for fracture or focal lesion. Prominent TMJ degenerative spurring. Sinuses/Orbits: No evidence of injury. CT CERVICAL SPINE FINDINGS Alignment: Reversal of cervical lordosis with slight C4-5 anterolisthesis and C5-6 retrolisthesis Skull base and vertebrae: No acute fracture. Sclerosis on both sides of the C5-6 disc space attributed to degeneration. Soft tissues and spinal canal: Subcutaneous stranding seen posterior to the left shoulder and the lower neck. Disc levels: Advanced and bulky degenerative facet spurring with C3-4 facet ankylosis. Advanced lower cervical degenerative disc narrowing and ridging with foraminal impingement. C3-4 ACDF with solid arthrodesis. Upper chest: No acute finding IMPRESSION: Head CT: No evidence of intracranial injury. Cervical spine CT 1. Subcutaneous contusions posterior to the left shoulder and lower cervical spine. 2. No acute fracture. 3. Advanced degenerative disease. Electronically Signed   By: Marnee Spring M.D.   On: 11/03/2019 05:05   CT ABDOMEN PELVIS W CONTRAST  Result Date: 11/03/2019 CLINICAL DATA:  Level 2 trauma due to fall. EXAM: CT CHEST, ABDOMEN, AND PELVIS WITH CONTRAST TECHNIQUE: Multidetector CT imaging of the chest, abdomen and pelvis was performed following the standard protocol during bolus administration of intravenous contrast. CONTRAST:  OMNIPAQUE IOHEXOL 300 MG/ML  SOLN COMPARISON:  None. FINDINGS: CT CHEST FINDINGS Cardiovascular: Normal heart size. No  pericardial effusion. No evidence of great vessel injury. Multifocal aortic and coronary atherosclerosis. Streak artifact affects pulmonary arteries with no convincing filling defect. Mediastinum/Nodes: No evidence of mass or adenopathy. Lungs/Pleura: No evidence of hemothorax, pneumothorax, or lung contusion. Subpleural nodules along the right minor fissure attributed to lymph nodes given location and shape. Mild dependent atelectasis Musculoskeletal: Soft tissue injury to the right forearm with swelling and deep space gas, known from radiography. Right shoulder girdle musculature is atrophic compared to the left. Subcutaneous contusion posteriorly to both scapulae. No acute fracture. CT ABDOMEN PELVIS FINDINGS Hepatobiliary: No hepatic injury or perihepatic hematoma. Gallbladder is unremarkable Pancreas: Negative Spleen: No splenic injury or perisplenic hematoma. Adrenals/Urinary Tract: No adrenal hemorrhage or renal injury identified. Multiple bilateral renal cysts with simple density. The largest emanates from the interpolar left kidney at 8.5 cm. Uncomplicated appearing diverticulum extending leftward from the bladder. Stomach/Bowel: No evidence of injury.  Diffuse formed stool Vascular/Lymphatic: Atherosclerosis with chronic occlusion of the right common iliac artery. The distal right external iliac reconstitutes. Reproductive: Negative Other: No evidence  of ascites or pneumoperitoneum Musculoskeletal: Subcutaneous reticulation lateral to the right hip. No associated hip fracture. IMPRESSION: 1. No evidence of intrathoracic or intra-abdominal injury. 2. Contusions posterior to the shoulders and lateral to the right hip. There is prominent swelling with soft tissue gas at the right forearm. No acute fracture. 3. Chronic and incidental findings noted above. Electronically Signed   By: Marnee Spring M.D.   On: 11/03/2019 05:19   DG Chest Port 1 View  Result Date: 11/03/2019 CLINICAL DATA:  Level 2 trauma  with right forearm laceration. EXAM: PORTABLE CHEST 1 VIEW COMPARISON:  None. FINDINGS: Low volume chest with interstitial crowding below the hila. There is no edema, consolidation, effusion, or pneumothorax. Normal heart size and mediastinal contours for rotation. IMPRESSION: No acute finding. Electronically Signed   By: Marnee Spring M.D.   On: 11/03/2019 04:10   DG Humerus Left  Result Date: 11/03/2019 CLINICAL DATA:  Level 2 trauma.  Left mid humerus bruising. EXAM: LEFT HUMERUS - 2+ VIEW COMPARISON:  None. FINDINGS: No acute fracture or malalignment.  No opaque foreign body. IMPRESSION: Negative for fracture Electronically Signed   By: Marnee Spring M.D.   On: 11/03/2019 04:11   DG HIP UNILAT WITH PELVIS 2-3 VIEWS RIGHT  Result Date: 11/03/2019 CLINICAL DATA:  Level 2 trauma.  Right hip pain EXAM: DG HIP (WITH OR WITHOUT PELVIS) 2-3V RIGHT COMPARISON:  None. FINDINGS: Soft tissue stranding lateral to the right hip. The right hip is foreshortened on all images, significantly limiting sensitivity. No detected hip or pelvic ring fracture. Osteopenia and atherosclerosis. IMPRESSION: 1. Contusion lateral to the right hip. 2. No visible fracture but limited by foreshortening of the femoral neck on all of the images, please have low threshold for additional imaging if ongoing clinical concern for fracture. Electronically Signed   By: Marnee Spring M.D.   On: 11/03/2019 04:10    EKG: Independently reviewed.  Sinus rhythm at 63 bpm  Assessment/Plan Elevated troponin: Acute.  Patient incidentally found to have elevated found to have elevated troponin 387.  CT of the chest with contrast revealed aortic and coronary atherosclerosis.  Denies having any chest pain or palpitations, but did report complaints of nausea. -Admit to a medical telemetry bed -Continue to trend cardiac troponin -Heparin per pharmacy -Antiemetics as needed -Cardiology formally consulted, will follow-up for further  recommend  Falls, contusions shoulder and hip: Acute.  Patient reports slipping on the hardwood floor.  He has had 2 other falls, but fell into the bed since moving.  Patient was not noted to be orthostatic.  Imaging noted to have contusions of the right hip and shoulders. -Tylenol as needed -Will benefit from PT/OT evaluations once acute issues resolved  History of right bundle branch block, and bradycardia: Patient has been evaluated by cardiology on 3/5.  It was recommended that diltiazem possibly be discontinued if symptoms persisted.  At this time heart rates are within normal limits. -Follow-up telemetry  Essential hypertension: Blood pressures appear stable.  Home blood pressure medications include Cardizem 125 mg daily, lisinopril 10 mg daily, and hydrochlorothiazide 12.5 mg daily. -Continue Cardizem and lisinopril -Initially held hydrochlorothiazide, but restart when medically appropriate  Elevated CK: Acute.  CK noted to be 416.  Suspect secondary to patient falling.  Patient was given 500 mL of normal saline IV fluids. -Will recheck CK in a.m.  Peripheral vascular disease: Patient had Doppler performed last in July 2020 ultrasound which revealed right: Probable total occlusion noted in the external  iliac artery, although proximal segment and mid and distal common iliac artery were not visualized. 75-99% stenosis noted in the distal superficial femoral artery. Left: 50-74% stenosis noted in the mid and distal external iliac artery. Distal occlusion noted in the anterior tibial artery -Continue Plavix  Prediabetes: Patient's initial glucose mildly elevated at 143.  Last hemoglobin A1c noted to be 6 in November 2020.  -Continue to monitor  History of CVA with residual deficit: Patient previously had a stroke in 2001 which left him with right-sided hemiparesis.  He has paresis of the right arm and leg normally at home that were not initially brought to the  hospital.  Hyperlipidemia -Continue Crestor   DVT prophylaxis: Heparin   Code Status: Full Family Communication: Discussed plan of care with the patient's niece present at bedside Disposition Plan: Likely to discharge home if work-up negative Consults called: Cardiology Admission status: Observation  Clydie Braun MD [facility] Pager [phone]   If 7PM-7AM, please contact night-coverage [email or web address] Password [email or web address]  11/03/2019, 10:09 AM

## 2019-11-03 NOTE — ED Notes (Signed)
Ambulated with pt about 15 ft in room, standby assist. He did not have his normal R foot brace from home, but walked steadily holding to nursing staff. Got nauseous, dizzy and diaphoretic after sitting after walk. No cp, normotensive VS

## 2019-11-03 NOTE — ED Notes (Signed)
Lunch Tray Ordered @ 1033. 

## 2019-11-03 NOTE — Consult Note (Addendum)
Cardiology Consultation:   Patient ID: [name]; [id number]; 1935/11/11   Admit date: 11/03/2019 Date of Consult: 11/03/2019  Primary Care Provider: Patient, No Pcp Per Primary Cardiologist: Lesleigh Noe, MD 10/09/2019 Primary Electrophysiologist:  None   Patient Profile:   [name] is a 84 y.o. 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  History of Present Illness:   [name] was recently seen by Dr. Katrinka Blazing 10/09/19, referred by PCP for bradycardia and RBBB. Patient was doing well from a symptomatic standpoint. Heart rates were in the 60s. No changes were made. Dr. Katrinka Blazing reviewed duplex study Abdominal aortic aneurysm (November 2020) which showed no evidence of an abdominal aortic aneurysm. The largest aortic measurement is 2.7 cm.    The patient saw Dr. Myra Gianotti back in 2020 for PAD/right leg swelling.  ABIs and a duplex were obtained which showed significant findings including a right iliac occlusion and left superficial femoral and external iliac artery stenosis. Since the patient was asymptomatic planned to continue with medical management.   Patient presented to the ED 11/03/2019 for a mechanical fall.  Patient had gotten up from his recliner and lost his balance and fell on the right side hitting the entertainment center befor falling to the floor. Said he slightly bumped his head on the right side. The floor was wood and he was wearing socks and had fallen 2 times prior.The patient was using a cane at the time.  The patient lives alone in his knees lives next door who helps look after him. He was on the floor for 3 hours before he could get to a phone. He notified his family who called EMS.  Patient  denies LOC. No chest pain, sob, lightheadedness, palpitations, leg swelling.  In the ED blood pressure 135/70, pulse 68, afebrile, respiratory rate 14, 97% O2.  Labs showed WBC 18.5, hemoglobin 12.6.  Potassium 3.5, creatinine 0.90.  BNP 38. HS troponin 387>>420.  CK total 416. EKG with NSR and PACs, minimal STE inf leads (Seen in previous EKGs). Patient was seen as a level 2 trauma and was pan scanned.  Imaging showed contusions of the shoulder and right hip and no acute fractures. He was given IVF. Patient was admitted for further work-up.   Past Medical History:  Diagnosis Date  . Arthritis   . Bundle branch block   . Hypercholesteremia   . Hypertension   . Peripheral vascular disease (HCC)   . Stroke [facility])    2001 - R side deficits    History reviewed. No pertinent surgical history.   Prior to Admission medications   Medication Sig Start Date End Date Taking? Authorizing Provider  Cholecalciferol (VITAMIN D) 50 MCG (2000 UT) tablet Take 2,000 Units by mouth daily.   Yes [provider]  clopidogrel (PLAVIX) 75 MG tablet Take 75 mg by mouth daily.   Yes [provider]  diltiazem (CARDIZEM CD) 180 MG 24 hr capsule Take 180 mg by mouth daily. 10/30/19  Yes [provider]  hydrochlorothiazide (HYDRODIURIL) 12.5 MG tablet Take 12.5 mg by mouth daily. 10/28/19  Yes [provider]  lisinopril (ZESTRIL) 10 MG tablet Take 10 mg by mouth daily.   Yes [provider]  rosuvastatin (CRESTOR) 10 MG  tablet Take 10 mg by mouth daily.   Yes [provider]  zinc gluconate 50 MG tablet Take 50 mg by mouth daily.   Yes [provider]    Inpatient Medications: Scheduled Meds: . clopidogrel  75 mg Oral Daily  . diltiazem  180 mg Oral Daily  . lisinopril  10 mg Oral Daily  . rosuvastatin  10 mg Oral Daily  . sodium chloride flush  3 mL Intravenous Q12H   Continuous Infusions: . heparin 1,000 Units/hr (11/03/19 1149)   PRN  Meds: acetaminophen **OR** acetaminophen, albuterol, ondansetron **OR** ondansetron (ZOFRAN) IV  Allergies:   No Known Allergies  Social History:   Social History   Socioeconomic History  . Marital status: Single    Spouse name: Not on file  . Number of children: Not on file  . Years of education: Not on file  . Highest education level: Not on file  Occupational History  . Not on file  Tobacco Use  . Smoking status: Never Smoker  . Smokeless tobacco: Never Used  Substance and Sexual Activity  . Alcohol use: Never  . Drug use: Never  . Sexual activity: Not on file  Other Topics Concern  . Not on file  Social History Narrative  . Not on file   Social Determinants of Health   Financial Resource Strain:   . Difficulty of Paying Living Expenses:   Food Insecurity:   . Worried About Programme researcher, broadcasting/film/video in the Last Year:   . Barista in the Last Year:   Transportation Needs:   . Freight forwarder (Medical):   [name] Lack of Transportation (Non-Medical):   Physical Activity:   . Days of Exercise per Week:   . Minutes of Exercise per Session:   Stress:   . Feeling of Stress :   Social Connections:   . Frequency of Communication with Friends and Family:   . Frequency of Social Gatherings with Friends and Family:   . Attends Religious Services:   . Active Member of Clubs or Organizations:   . Attends Banker Meetings:   [name] Marital Status:   Intimate Partner Violence:   . Fear of Current or Ex-Partner:   . Emotionally Abused:   [name] Physically Abused:   . Sexually Abused:     Family History:   No family history on file. Family Status:  No family status information on file.    ROS:  Please see the history of present illness.  All other ROS reviewed and negative.     Physical Exam/Data:   Vitals:   11/03/19 1115 11/03/19 1130 11/03/19 1145 11/03/19 1200  BP: (!) 147/75 129/72 133/70 (!) 143/70  Pulse: 63 65 64 64  Resp: 11 14 14 13   Temp:       TempSrc:      SpO2: 97% 97% 97% 98%  Weight:      Height:        Intake/Output Summary (Last 24 hours) at 11/03/2019 1333 Last data filed at 11/03/2019 0815 Gross per 24 hour  Intake 500 ml  Output --  Net 500 ml    Last 3 Weights 11/03/2019  Weight (lbs) 180 lb  Weight (kg) 81.647 kg     Body mass index is 24.41 kg/m.   General:  Well nourished, well developed, male in no acute distress HEENT: normal Lymph: no adenopathy Neck: JVD  Endocrine:  No thryomegaly Vascular: No carotid bruits; 4/4 extremity pulses 2+  Cardiac:  normal S1, S2; RRR; no murmur Lungs:  clear bilaterally, no wheezing, rhonchi or rales  Abd: soft, nontender, no hepatomegaly  Ext: no edema Musculoskeletal:  No deformities, BUE and BLE strength normal and equal Skin: warm and dry  Neuro:  Right-sided weakness Psych:  Normal affect   EKG:  The EKG was personally reviewed and demonstrates:   EKG with NSR and PACs, minimal STE inf leads (Seen in previous EKGs) Telemetry:  Telemetry was personally reviewed and demonstrates:  N/A   CV studies:   ECHO: 11/03/2019  1. Left ventricular ejection fraction, by estimation, is 60 to 65%. The left ventricle has normal function. The left ventricle has no regional wall motion abnormalities. Left ventricular diastolic parameters are consistent with Grade I diastolic dysfunction (impaired relaxation).  2. Right ventricular systolic function was not well visualized. The right ventricular size is not well visualized.  3. The mitral valve is grossly normal. No evidence of mitral valve regurgitation.  4. The aortic valve was not well visualized. Aortic valve regurgitation is not visualized.   CATH: none  Carotid Doppler 06/26/2019: Summary:  Right Carotid: Velocities in the right ICA are consistent with a 1-39% stenosis. Non-hemodynamically significant plaque <50% noted in the CCA. The ECA appears <50% stenosed. Calcifications may obscure more elevated  velocities.    Turbulent subclavian artery and innominate artery may  indicate a more proximal stenosis.   Left Carotid: Velocities in the left ICA are consistent with a 1-39% stenosis. Non-hemodynamically significant plaque <50% noted in the CCA. The ECA appears <50% stenosed. Calcifications may obscure more elevated velocities.   Vertebrals: Bilateral vertebral arteries demonstrate antegrade flow. Subclavians: Right subclavian artery flow was disturbed. Normal flow hemodynamics were seen in the left subclavian artery.    Abdominal aortic aneurysm duplex study November 2020: Summary:  Abdominal Aorta: No evidence of an abdominal aortic aneurysm was  visualized. The largest aortic measurement is 2.7 cm.  Stenosis: +--------------------+--------+  Location      Stenosis  +--------------------+--------+  Right Common Iliac occluded  +--------------------+--------+  Right External Iliacoccluded  +--------------------+--------+   Unable to discern flow distal to the right common iliac artery origin  through the mid right external iliac artery. The distal right external  iliac artery demonstrates low velocity monophasic flow.  Lower extremity Doppler study July 2020: Summary:  Right: Resting right ankle-brachial index indicates severe right lower  extremity arterial disease. The right toe-brachial index is abnormal.   Left: Resting left ankle-brachial index indicates mild left lower  extremity arterial disease. The left toe-brachial index is abnormal.   Summary:  Right: Probable total occlusion noted in the external iliac artery,  although proximal segment and mid and distal common iliac artery were not  visualized.  75-99% stenosis noted in the distal superficial femoral artery.   Left: 50-74% stenosis noted in the mid and distal external iliac artery.   Distal occlusion noted in the anterior tibial artery.    Laboratory Data:   Chemistry Recent Labs   Lab 11/03/19 0406 11/03/19 0415  NA 134* 135  K 3.5 3.5  CL 99 99  CO2 22  --   GLUCOSE 143* 139*  BUN 15 16  CREATININE 0.98 0.90  CALCIUM 9.1  --   GFRNONAA >60  --   GFRAA >60  --   ANIONGAP 13  --     Lab Results  Component Value Date   ALT 41 11/03/2019   AST 37 11/03/2019   ALKPHOS 72 11/03/2019  BILITOT 1.3 (H) 11/03/2019   Hematology Recent Labs  Lab 11/03/19 0406 11/03/19 0415  WBC 18.5*  --   RBC 3.86*  --   HGB 12.6* 13.3  HCT 36.7* 39.0  MCV 95.1  --   MCH 32.6  --   MCHC 34.3  --   RDW 15.8*  --   PLT 260  --    Cardiac Enzymes High Sensitivity Troponin:   Recent Labs  Lab 11/03/19 0836 11/03/19 1022  TROPONINIHS 387* 420*      BNP Recent Labs  Lab 11/03/19 0846  BNP 38.5    DDimer No results for input(s): DDIMER in the last 168 hours. TSH:  Lab Results  Component Value Date   TSH 0.550 11/03/2019   Lipids:No results found for: CHOL, HDL, LDLCALC, LDLDIRECT, TRIG, CHOLHDL HgbA1c:No results found for: HGBA1C Magnesium: No results found for: MG   Radiology/Studies:  DG Forearm Right  Result Date: 11/03/2019 CLINICAL DATA:  Fall with forearm laceration EXAM: RIGHT FOREARM - 2 VIEW COMPARISON:  None. FINDINGS: Prominent forearm swelling with soft tissue gas that is deep and adjacent to a skin irregularity. No acute fracture. No opaque foreign body. Prominent osteopenia. IMPRESSION: Soft tissue swelling and deep soft tissue gas. No opaque foreign body or fracture. Electronically Signed   By: Marnee Spring M.D.   On: 11/03/2019 04:12   CT HEAD WO CONTRAST  Result Date: 11/03/2019 CLINICAL DATA:  Head trauma and headache EXAM: CT HEAD WITHOUT CONTRAST CT CERVICAL SPINE WITHOUT CONTRAST TECHNIQUE: Multidetector CT imaging of the head and cervical spine was performed following the standard protocol without intravenous contrast. Multiplanar CT image reconstructions of the cervical spine were also generated. COMPARISON:  None available  FINDINGS: CT HEAD FINDINGS Brain: No evidence of acute infarction, hemorrhage, hydrocephalus, extra-axial collection or mass lesion/mass effect. Generalized atrophy. Vascular: Atherosclerotic calcification. Skull: Normal. Negative for fracture or focal lesion. Prominent TMJ degenerative spurring. Sinuses/Orbits: No evidence of injury. CT CERVICAL SPINE FINDINGS Alignment: Reversal of cervical lordosis with slight C4-5 anterolisthesis and C5-6 retrolisthesis Skull base and vertebrae: No acute fracture. Sclerosis on both sides of the C5-6 disc space attributed to degeneration. Soft tissues and spinal canal: Subcutaneous stranding seen posterior to the left shoulder and the lower neck. Disc levels: Advanced and bulky degenerative facet spurring with C3-4 facet ankylosis. Advanced lower cervical degenerative disc narrowing and ridging with foraminal impingement. C3-4 ACDF with solid arthrodesis. Upper chest: No acute finding IMPRESSION: Head CT: No evidence of intracranial injury. Cervical spine CT 1. Subcutaneous contusions posterior to the left shoulder and lower cervical spine. 2. No acute fracture. 3. Advanced degenerative disease. Electronically Signed   By: Marnee Spring M.D.   On: 11/03/2019 05:05   CT CHEST W CONTRAST  Result Date: 11/03/2019 CLINICAL DATA:  Level 2 trauma due to fall. EXAM: CT CHEST, ABDOMEN, AND PELVIS WITH CONTRAST TECHNIQUE: Multidetector CT imaging of the chest, abdomen and pelvis was performed following the standard protocol during bolus administration of intravenous contrast. CONTRAST:  OMNIPAQUE IOHEXOL 300 MG/ML  SOLN COMPARISON:  None. FINDINGS: CT CHEST FINDINGS Cardiovascular: Normal heart size. No pericardial effusion. No evidence of great vessel injury. Multifocal aortic and coronary atherosclerosis. Streak artifact affects pulmonary arteries with no convincing filling defect. Mediastinum/Nodes: No evidence of mass or adenopathy. Lungs/Pleura: No evidence of hemothorax,  pneumothorax, or lung contusion. Subpleural nodules along the right minor fissure attributed to lymph nodes given location and shape. Mild dependent atelectasis Musculoskeletal: Soft tissue injury to the right  forearm with swelling and deep space gas, known from radiography. Right shoulder girdle musculature is atrophic compared to the left. Subcutaneous contusion posteriorly to both scapulae. No acute fracture. CT ABDOMEN PELVIS FINDINGS Hepatobiliary: No hepatic injury or perihepatic hematoma. Gallbladder is unremarkable Pancreas: Negative Spleen: No splenic injury or perisplenic hematoma. Adrenals/Urinary Tract: No adrenal hemorrhage or renal injury identified. Multiple bilateral renal cysts with simple density. The largest emanates from the interpolar left kidney at 8.5 cm. Uncomplicated appearing diverticulum extending leftward from the bladder. Stomach/Bowel: No evidence of injury.  Diffuse formed stool Vascular/Lymphatic: Atherosclerosis with chronic occlusion of the right common iliac artery. The distal right external iliac reconstitutes. Reproductive: Negative Other: No evidence of ascites or pneumoperitoneum Musculoskeletal: Subcutaneous reticulation lateral to the right hip. No associated hip fracture. IMPRESSION: 1. No evidence of intrathoracic or intra-abdominal injury. 2. Contusions posterior to the shoulders and lateral to the right hip. There is prominent swelling with soft tissue gas at the right forearm. No acute fracture. 3. Chronic and incidental findings noted above. Electronically Signed   By: Monte Fantasia M.D.   On: 11/03/2019 05:19   CT CERVICAL SPINE WO CONTRAST  Result Date: 11/03/2019 CLINICAL DATA:  Head trauma and headache EXAM: CT HEAD WITHOUT CONTRAST CT CERVICAL SPINE WITHOUT CONTRAST TECHNIQUE: Multidetector CT imaging of the head and cervical spine was performed following the standard protocol without intravenous contrast. Multiplanar CT image reconstructions of the cervical  spine were also generated. COMPARISON:  None available FINDINGS: CT HEAD FINDINGS Brain: No evidence of acute infarction, hemorrhage, hydrocephalus, extra-axial collection or mass lesion/mass effect. Generalized atrophy. Vascular: Atherosclerotic calcification. Skull: Normal. Negative for fracture or focal lesion. Prominent TMJ degenerative spurring. Sinuses/Orbits: No evidence of injury. CT CERVICAL SPINE FINDINGS Alignment: Reversal of cervical lordosis with slight C4-5 anterolisthesis and C5-6 retrolisthesis Skull base and vertebrae: No acute fracture. Sclerosis on both sides of the C5-6 disc space attributed to degeneration. Soft tissues and spinal canal: Subcutaneous stranding seen posterior to the left shoulder and the lower neck. Disc levels: Advanced and bulky degenerative facet spurring with C3-4 facet ankylosis. Advanced lower cervical degenerative disc narrowing and ridging with foraminal impingement. C3-4 ACDF with solid arthrodesis. Upper chest: No acute finding IMPRESSION: Head CT: No evidence of intracranial injury. Cervical spine CT 1. Subcutaneous contusions posterior to the left shoulder and lower cervical spine. 2. No acute fracture. 3. Advanced degenerative disease. Electronically Signed   By: Monte Fantasia M.D.   On: 11/03/2019 05:05   CT ABDOMEN PELVIS W CONTRAST  Result Date: 11/03/2019 CLINICAL DATA:  Level 2 trauma due to fall. EXAM: CT CHEST, ABDOMEN, AND PELVIS WITH CONTRAST TECHNIQUE: Multidetector CT imaging of the chest, abdomen and pelvis was performed following the standard protocol during bolus administration of intravenous contrast. CONTRAST:  149mL OMNIPAQUE IOHEXOL 300 MG/ML  SOLN COMPARISON:  None. FINDINGS: CT CHEST FINDINGS Cardiovascular: Normal heart size. No pericardial effusion. No evidence of great vessel injury. Multifocal aortic and coronary atherosclerosis. Streak artifact affects pulmonary arteries with no convincing filling defect. Mediastinum/Nodes: No evidence  of mass or adenopathy. Lungs/Pleura: No evidence of hemothorax, pneumothorax, or lung contusion. Subpleural nodules along the right minor fissure attributed to lymph nodes given location and shape. Mild dependent atelectasis Musculoskeletal: Soft tissue injury to the right forearm with swelling and deep space gas, known from radiography. Right shoulder girdle musculature is atrophic compared to the left. Subcutaneous contusion posteriorly to both scapulae. No acute fracture. CT ABDOMEN PELVIS FINDINGS Hepatobiliary: No hepatic injury or perihepatic hematoma.  Gallbladder is unremarkable Pancreas: Negative Spleen: No splenic injury or perisplenic hematoma. Adrenals/Urinary Tract: No adrenal hemorrhage or renal injury identified. Multiple bilateral renal cysts with simple density. The largest emanates from the interpolar left kidney at 8.5 cm. Uncomplicated appearing diverticulum extending leftward from the bladder. Stomach/Bowel: No evidence of injury.  Diffuse formed stool Vascular/Lymphatic: Atherosclerosis with chronic occlusion of the right common iliac artery. The distal right external iliac reconstitutes. Reproductive: Negative Other: No evidence of ascites or pneumoperitoneum Musculoskeletal: Subcutaneous reticulation lateral to the right hip. No associated hip fracture. IMPRESSION: 1. No evidence of intrathoracic or intra-abdominal injury. 2. Contusions posterior to the shoulders and lateral to the right hip. There is prominent swelling with soft tissue gas at the right forearm. No acute fracture. 3. Chronic and incidental findings noted above. Electronically Signed   By: Marnee Spring M.D.   On: 11/03/2019 05:19   DG Chest Port 1 View  Result Date: 11/03/2019 CLINICAL DATA:  Level 2 trauma with right forearm laceration. EXAM: PORTABLE CHEST 1 VIEW COMPARISON:  None. FINDINGS: Low volume chest with interstitial crowding below the hila. There is no edema, consolidation, effusion, or pneumothorax. Normal  heart size and mediastinal contours for rotation. IMPRESSION: No acute finding. Electronically Signed   By: Marnee Spring M.D.   On: 11/03/2019 04:10   DG Humerus Left  Result Date: 11/03/2019 CLINICAL DATA:  Level 2 trauma.  Left mid humerus bruising. EXAM: LEFT HUMERUS - 2+ VIEW COMPARISON:  None. FINDINGS: No acute fracture or malalignment.  No opaque foreign body. IMPRESSION: Negative for fracture Electronically Signed   By: Marnee Spring M.D.   On: 11/03/2019 04:11   ECHOCARDIOGRAM COMPLETE  Result Date: 11/03/2019    ECHOCARDIOGRAM REPORT   Patient Name:   BANNON GIAMMARCO Date of Exam: 11/03/2019 Medical Rec #:  [id number]       Height:       72.0 in Accession #:    [id number]      Weight:       180.0 lb Date of Birth:  Oct 23, 1935       BSA:          2.037 m Patient Age:    84 years        BP:           143/70 mmHg Patient Gender: M               HR:           64 bpm. Exam Location:  Inpatient Procedure: 2D Echo Indications:    Elevated Troponin  History:        Patient has no prior history of Echocardiogram examinations. PAD                 and Stroke, Arrythmias:RBBB; Risk Factors:Hypertension and                 Dyslipidemia. Fall at home.  Sonographer:    Leta Jungling RDCS Referring Phys: [id number] RONDELL A SMITH  Sonographer Comments: Technically challenging study due to limited acoustic windows. Fixed joints within the right arm IMPRESSIONS  1. Left ventricular ejection fraction, by estimation, is 60 to 65%. The left ventricle has normal function. The left ventricle has no regional wall motion abnormalities. Left ventricular diastolic parameters are consistent with Grade I diastolic dysfunction (impaired relaxation).  2. Right ventricular systolic function was not well visualized. The right ventricular size is not well visualized.  3. The mitral valve is grossly normal.  No evidence of mitral valve regurgitation.  4. The aortic valve was not well visualized. Aortic valve regurgitation is not  visualized. FINDINGS  Left Ventricle: Left ventricular ejection fraction, by estimation, is 60 to 65%. The left ventricle has normal function. The left ventricle has no regional wall motion abnormalities. The left ventricular internal cavity size was normal in size. There is  no left ventricular hypertrophy. Left ventricular diastolic parameters are consistent with Grade I diastolic dysfunction (impaired relaxation). Indeterminate filling pressures. Right Ventricle: The right ventricular size is not well visualized. Right vetricular wall thickness was not assessed. Right ventricular systolic function was not well visualized. Left Atrium: Left atrial size was normal in size. Right Atrium: Right atrial size was normal in size. Pericardium: There is no evidence of pericardial effusion. Mitral Valve: The mitral valve is grossly normal. No evidence of mitral valve regurgitation. Tricuspid Valve: The tricuspid valve is grossly normal. Tricuspid valve regurgitation is trivial. Aortic Valve: The aortic valve was not well visualized. Aortic valve regurgitation is not visualized. Pulmonic Valve: The pulmonic valve was not well visualized. Pulmonic valve regurgitation is not visualized. Aorta: The aortic root and ascending aorta are structurally normal, with no evidence of dilitation. Venous: The inferior vena cava was not well visualized. IAS/Shunts: No atrial level shunt detected by color flow Doppler.   Diastology LV e' lateral:   7.18 cm/s LV E/e' lateral: 7.2 LV e' medial:    4.03 cm/s LV E/e' medial:  12.8  RIGHT VENTRICLE RV S prime:     16.10 cm/s TAPSE (M-mode): 2.2 cm LEFT ATRIUM           Index LA Vol (A4C): 32.3 ml 15.85 ml/m  AORTIC VALVE LVOT Vmax:   93.80 cm/s LVOT Vmean:  61.300 cm/s LVOT VTI:    0.189 m MITRAL VALVE MV Area (PHT): 1.89 cm    SHUNTS MV Decel Time: 401 msec    Systemic VTI: 0.19 m MV E velocity: 51.40 cm/s MV A velocity: 91.30 cm/s MV E/A ratio:  0.56 Zoila Shutter MD Electronically signed by  Zoila Shutter MD Signature Date/Time: 11/03/2019/12:44:43 PM    Final    DG HIP UNILAT WITH PELVIS 2-3 VIEWS RIGHT  Result Date: 11/03/2019 CLINICAL DATA:  Level 2 trauma.  Right hip pain EXAM: DG HIP (WITH OR WITHOUT PELVIS) 2-3V RIGHT COMPARISON:  None. FINDINGS: Soft tissue stranding lateral to the right hip. The right hip is foreshortened on all images, significantly limiting sensitivity. No detected hip or pelvic ring fracture. Osteopenia and atherosclerosis. IMPRESSION: 1. Contusion lateral to the right hip. 2. No visible fracture but limited by foreshortening of the femoral neck on all of the images, please have low threshold for additional imaging if ongoing clinical concern for fracture. Electronically Signed   By: Marnee Spring M.D.   On: 11/03/2019 04:10    Assessment and Plan:   Elevated Troponin - In the setting of a fall and elevated CK to 416 - HS trop 387 > 420 - Patient denies chest pain or sob. - He has no CAD history but very likely given PAD. RF for CAD include HTN, HLD, age. Denies family history of CAD - EKG with minimal STE inferior leads (seen on previous EKG). Recommend serial EKGs - Echo showed EF 60-65%, no wall motion abnormalities, G1DD. - Continue to trend troponin. Would discontinue IV heparin given that he is asymptomatic. Would resume Aspirin. - Can consider ischemic eval as OP with stress test vs cardiac CT. - MD to  see  Falls - has had multiple falls since moving to wood floor.  - Imaging showed contusions on the right hip and shoulders - plan for PT/OT eval  PAD -Lower extremity angiography July 2020 showed Probable total occlusion noted in the external iliac artery, although proximal segment and mid and distal common iliac artery were not visualized.75-99% stenosis noted in the distal superficial femoral artery. On the left side 50-74% stenosis noted in the mid and distal external iliac artery. Distal occlusion noted in the anterior tibial artery.  -  plavix and statin  HTN - on lisinopril  daily and Dilt 180 mg daily - pressures reasonable - continue current regimen - Rates stable in the 60s  HLD - crestor  daily - LDL 75 in 02/2019  H/o stroke with residual deficit on the right side - plavix -  right ICA are consistent with a 1-39% stenosis. Left ICA are consistent with a 1-39% stenosis.   Principal Problem:   Elevated troponin Active Problems:   PVD (peripheral vascular disease) (HCC)   History of CVA with residual deficit   Elevated CK   Fall at home, initial encounter   Prediabetes   Essential hypertension     For questions or updates, please contact [facility] Please consult [email or web address] for contact info under Cardiology/STEMI.   Signed, Cadence David Stall, PA-C  11/03/2019 1:33 PM   Patient seen and examined. Agree with assessment and plan.  [name] is an 84 year old African-American male with history of hypertension, hyperlipidemia, remote CVA in 2001 with residual right-sided weakness,  peripheral vascular disease mild bilateral carotid disease as well as lower extremity PVD followed by Dr. Myra Gianotti.   He was recently evaluated by Dr. Katrinka Blazing in the office right bundle branch block and possible bradycardia.  At that time his resting pulse was 60.  He presented to the emergency room today after his recliner and slipped on the hardwood floors and felt on the right side and entertainment center before going to the floor.  He had difficulty moving floor to get to a phone and as result was on the hard floor for some time.  He emphatically denies any associated prodrome of palpitations, presyncope or syncope.  Is wearing socks and essentially slipped leading to his mechanical fall.  Imaging studies have not demonstrated any fractures although he does have subcutaneous contusions posterior to his left shoulder and lower cervical spine as well as to the right hip.  He denies any chest tightness or anginal  symptomatology.  He denies any increasing shortness of breath.  Spine CT showed degenerative disease without acute fracture.  Vascular system is notable for atherosclerosis with chronic occlusion of the right common iliac artery reconstitution of the the distal right external iliac.  On exam he is alert and oriented in no acute distress.  He is very thankful that he did not.  He continues grossly unremarkable.  He did not have JVD.  His lungs were clear.  There was regular with no ectopy.  There was 1/6 systolic murmur.  There was no rhythm.  Abdomen was soft and nontender.  Bilateral femoral bruits.  There was no edema. ECG reveals normal sinus rhythm at 62 bpm with PAC and mild RV conduction delay with early transition without definitive right bundle branch block.  High-sensitivity troponin is 387 with subsequent 420 reflective of acute coronary syndrome most likely due to demand ischemic changes.  There is no indication for heparin therapy ambulate recommend discontinuance weekly with  his recent mechanical fall and potential for bleeding.  Echo Doppler study shows normal EF at 60 to 65% without regional motion abnormalities.  There is grade 1 diastolic dysfunction.  Present recommend serial ECG with additional troponin assessment.  History is not supportive of arrhythmogenic etiology or syncope but rather medical fall based on slipping on the hardwood floor in his socks with difficulty in getting back up.  At present would continue previous cardiac medications.  If he develops bradycardia, can consider substituting amlodipine for Cardizem.  Will follow.    Lennette Bihari, MD, [facility] 11/03/2019 3:07 PM

## 2019-11-03 NOTE — Progress Notes (Signed)
ANTICOAGULATION CONSULT NOTE - Initial Consult  Pharmacy Consult for heparin Indication: chest pain/ACS  No Known Allergies  Patient Measurements: Height: 6' (182.9 cm) Weight: 180 lb (81.6 kg) IBW/kg (Calculated) : 77.6 Heparin Dosing Weight: 81.6kg  Vital Signs: Temp: 97.5 F (36.4 C) (03/30 0345) Temp Source: Axillary (03/30 0345) BP: 125/70 (03/30 1015) Pulse Rate: 65 (03/30 1015)  Labs: Recent Labs    11/03/19 0406 11/03/19 0415 11/03/19 0836  HGB 12.6* 13.3  --   HCT 36.7* 39.0  --   PLT 260  --   --   LABPROT 14.4  --   --   INR 1.1  --   --   CREATININE 0.98 0.90  --   CKTOTAL 416*  --   --   TROPONINIHS  --   --  387*    Estimated Creatinine Clearance: 69.5 mL/min (by C-G formula based on SCr of 0.9 mg/dL).   Medical History: Past Medical History:  Diagnosis Date  . Arthritis   . Bundle branch block   . Hypercholesteremia   . Hypertension   . Peripheral vascular disease (HCC)   . Stroke (HCC)    2010 - R side deficits    Medications:  Infusions:  . heparin      Assessment: 84 yom presented to the ED after a mechanical fall. Troponin elevated and now starting IV heparin. Baseline CBC is WNL and he is not on anticoagulation PTA.   Goal of Therapy:  Heparin level 0.3-0.7 units/ml Monitor platelets by anticoagulation protocol: Yes   Plan:  Heparin bolus 4000 units IV x 1 Heparin gtt 1000 units/hr Check an 8 hr heparin level Daily heparin level and CBC  Elizabeht Suto, Drake Leach 11/03/2019,10:33 AM

## 2019-11-03 NOTE — ED Provider Notes (Signed)
[facility] EMERGENCY DEPARTMENT Provider Note   CSN: [id number] Arrival date & time: 11/03/19  7591     History Chief Complaint  Patient presents with  . Level 2 fall    [name] is a 84 y.o. male.  Patient with a history of poor hearing, arthritis, HTN, CVA w/residual right deficits, HLD, PVD presents by EMS after he fell in his home. Family at hospital who reports she was called by the patient who stated he had gotten up from his recliner and lost his balance, falling to the right and hitting an entertainment center before falling to the floor. The patient denies LOC, headache, neck pain, chest pain or abdominal pain. He reports there is pain in his right arm, right hip and left upper arm. He is taking Plavix and aspirin. Per family, he is a questionable historian. He does live alone and is usually independently functioning. He walks with a cane.   The history is provided by the patient, the EMS personnel and a relative. No language interpreter was used.       No past medical history on file.  There are no problems to display for this patient.  No family history on file.  Social History   Tobacco Use  . Smoking status: Not on file  Substance Use Topics  . Alcohol use: Not on file  . Drug use: Not on file    Home Medications Prior to Admission medications   Not on File    Allergies    Patient has no allergy information on record.  Review of Systems   Review of Systems  Reason unable to perform ROS: Patient is poor historian; fall unwitnessed.    Physical Exam Updated Vital Signs BP 135/70   Pulse 68   Temp (!) 97.5 F (36.4 C) (Axillary)   Resp 14   Ht 6' (1.829 m)   Wt 81.6 kg   SpO2 97%   BMI 24.41 kg/m   Physical Exam Constitutional:      Appearance: He is well-developed.  HENT:     Head: Normocephalic.  Cardiovascular:     Rate and Rhythm: Normal rate and regular rhythm.  Pulmonary:     Effort: Pulmonary effort is  normal.     Breath sounds: Normal breath sounds. No wheezing, rhonchi or rales.  Chest:     Chest wall: No tenderness.  Abdominal:     General: Bowel sounds are normal. There is no distension.     Palpations: Abdomen is soft.     Tenderness: There is no abdominal tenderness. There is no guarding or rebound.     Comments: Tenderness on palpation of RLQ. No abdominal wall bruising.   Musculoskeletal:        General: Normal range of motion.     Cervical back: Normal range of motion and neck supple.     Comments: Right arm contracture to adduction. There is mild shortening of the right lower extremity without full rotation, chronic (h/o CVA) vs acute (new hip pain). Tender to left shoulder with preserved FROM of the arm. Tender left bicep.   Skin:    General: Skin is warm and dry.     Findings: No rash.     Comments: Skin tear to right posterior forearm. There are multiple bruises in various stages of healing: chest wall, right upper arm, left upper arm.   Neurological:     Mental Status: He is alert.     Comments: Patient  awake and alert. He is in no acute distress.      ED Results / Procedures / Treatments   Labs (all labs ordered are listed, but only abnormal results are displayed) Labs Reviewed  CBC - Abnormal; Notable for the following components:      Result Value   WBC 18.5 (*)    RBC 3.86 (*)    Hemoglobin 12.6 (*)    HCT 36.7 (*)    RDW 15.8 (*)    All other components within normal limits  I-STAT CHEM 8, ED - Abnormal; Notable for the following components:   Glucose, Bld 139 (*)    All other components within normal limits  CBG MONITORING, ED - Abnormal; Notable for the following components:   Glucose-Capillary 137 (*)    All other components within normal limits  COMPREHENSIVE METABOLIC PANEL  ETHANOL  URINALYSIS, ROUTINE W REFLEX MICROSCOPIC  PROTIME-INR  SAMPLE TO BLOOD BANK  TYPE AND SCREEN   Results for orders placed or performed during the hospital  encounter of 11/03/19  Comprehensive metabolic panel  Result Value Ref Range   Sodium 134 (L) 135 - 145 mmol/L   Potassium 3.5 3.5 - 5.1 mmol/L   Chloride 99 98 - 111 mmol/L   CO2 22 22 - 32 mmol/L   Glucose, Bld 143 (H) 70 - 99 mg/dL   BUN 15 8 - 23 mg/dL   Creatinine, Ser 9.76 0.61 - 1.24 mg/dL   Calcium 9.1 8.9 - 73.4 mg/dL   Total Protein 6.2 (L) 6.5 - 8.1 g/dL   Albumin 3.8 3.5 - 5.0 g/dL   AST 37 15 - 41 U/L   ALT 41 0 - 44 U/L   Alkaline Phosphatase 72 38 - 126 U/L   Total Bilirubin 1.3 (H) 0.3 - 1.2 mg/dL   GFR calc non Af Amer >60 >60 mL/min   GFR calc Af Amer >60 >60 mL/min   Anion gap 13 5 - 15  CBC  Result Value Ref Range   WBC 18.5 (H) 4.0 - 10.5 K/uL   RBC 3.86 (L) 4.22 - 5.81 MIL/uL   Hemoglobin 12.6 (L) 13.0 - 17.0 g/dL   HCT 19.3 (L) 79.0 - 24.0 %   MCV 95.1 80.0 - 100.0 fL   MCH 32.6 26.0 - 34.0 pg   MCHC 34.3 30.0 - 36.0 g/dL   RDW 97.3 (H) 53.2 - 99.2 %   Platelets 260 150 - 400 K/uL   nRBC 0.0 0.0 - 0.2 %  Ethanol  Result Value Ref Range   Alcohol, Ethyl (B) <10 <10 mg/dL  Urinalysis, Routine w reflex microscopic  Result Value Ref Range   Color, Urine YELLOW YELLOW   APPearance CLEAR CLEAR   Specific Gravity, Urine 1.045 (H) 1.005 - 1.030   pH 5.0 5.0 - 8.0   Glucose, UA NEGATIVE NEGATIVE mg/dL   Hgb urine dipstick NEGATIVE NEGATIVE   Bilirubin Urine NEGATIVE NEGATIVE   Ketones, ur NEGATIVE NEGATIVE mg/dL   Protein, ur NEGATIVE NEGATIVE mg/dL   Nitrite NEGATIVE NEGATIVE   Leukocytes,Ua NEGATIVE NEGATIVE  Protime-INR  Result Value Ref Range   Prothrombin Time 14.4 11.4 - 15.2 seconds   INR 1.1 0.8 - 1.2  CK  Result Value Ref Range   Total CK 416 (H) 49 - 397 U/L  I-Stat Chem 8, ED  Result Value Ref Range   Sodium 135 135 - 145 mmol/L   Potassium 3.5 3.5 - 5.1 mmol/L   Chloride 99 98 - 111  mmol/L   BUN 16 8 - 23 mg/dL   Creatinine, Ser 0.90 0.61 - 1.24 mg/dL   Glucose, Bld 139 (H) 70 - 99 mg/dL   Calcium, Ion 1.15 1.15 - 1.40 mmol/L    TCO2 24 22 - 32 mmol/L   Hemoglobin 13.3 13.0 - 17.0 g/dL   HCT 39.0 39.0 - 52.0 %  CBG monitoring, ED  Result Value Ref Range   Glucose-Capillary 137 (H) 70 - 99 mg/dL  CBG monitoring, ED  Result Value Ref Range   Glucose-Capillary 130 (H) 70 - 99 mg/dL  Type and screen [place]  Result Value Ref Range   ABO/RH(D) B NEG    Antibody Screen POS    Sample Expiration 11/06/2019,2359    Antibody Identification      ANTI D Performed at [facility], [place] [address], [place], [place] [address]     EKG EKG Interpretation  Date/Time:  Tuesday November 03 2019 03:39:20 EDT Ventricular Rate:  62 PR Interval:    QRS Duration: 78 QT Interval:  444 QTC Calculation: 451 R Axis:   74 Text Interpretation: Sinus rhythm Atrial premature complexes in couplets Prolonged PR interval Abnormal R-wave progression, early transition No old tracing to compare Confirmed by Ward, Cyril Mourning [id number]) on 11/03/2019 4:16:38 AM   Radiology DG Forearm Right  Result Date: 11/03/2019 CLINICAL DATA:  Fall with forearm laceration EXAM: RIGHT FOREARM - 2 VIEW COMPARISON:  None. FINDINGS: Prominent forearm swelling with soft tissue gas that is deep and adjacent to a skin irregularity. No acute fracture. No opaque foreign body. Prominent osteopenia. IMPRESSION: Soft tissue swelling and deep soft tissue gas. No opaque foreign body or fracture. Electronically Signed   By: Monte Fantasia M.D.   On: 11/03/2019 04:12   CT HEAD WO CONTRAST  Result Date: 11/03/2019 CLINICAL DATA:  Head trauma and headache EXAM: CT HEAD WITHOUT CONTRAST CT CERVICAL SPINE WITHOUT CONTRAST TECHNIQUE: Multidetector CT imaging of the head and cervical spine was performed following the standard protocol without intravenous contrast. Multiplanar CT image reconstructions of the cervical spine were also generated. COMPARISON:  None available FINDINGS: CT HEAD FINDINGS Brain: No evidence of acute infarction, hemorrhage,  hydrocephalus, extra-axial collection or mass lesion/mass effect. Generalized atrophy. Vascular: Atherosclerotic calcification. Skull: Normal. Negative for fracture or focal lesion. Prominent TMJ degenerative spurring. Sinuses/Orbits: No evidence of injury. CT CERVICAL SPINE FINDINGS Alignment: Reversal of cervical lordosis with slight C4-5 anterolisthesis and C5-6 retrolisthesis Skull base and vertebrae: No acute fracture. Sclerosis on both sides of the C5-6 disc space attributed to degeneration. Soft tissues and spinal canal: Subcutaneous stranding seen posterior to the left shoulder and the lower neck. Disc levels: Advanced and bulky degenerative facet spurring with C3-4 facet ankylosis. Advanced lower cervical degenerative disc narrowing and ridging with foraminal impingement. C3-4 ACDF with solid arthrodesis. Upper chest: No acute finding IMPRESSION: Head CT: No evidence of intracranial injury. Cervical spine CT 1. Subcutaneous contusions posterior to the left shoulder and lower cervical spine. 2. No acute fracture. 3. Advanced degenerative disease. Electronically Signed   By: Monte Fantasia M.D.   On: 11/03/2019 05:05   CT CERVICAL SPINE WO CONTRAST  Result Date: 11/03/2019 CLINICAL DATA:  Head trauma and headache EXAM: CT HEAD WITHOUT CONTRAST CT CERVICAL SPINE WITHOUT CONTRAST TECHNIQUE: Multidetector CT imaging of the head and cervical spine was performed following the standard protocol without intravenous contrast. Multiplanar CT image reconstructions of the cervical spine were also generated. COMPARISON:  None available FINDINGS:  CT HEAD FINDINGS Brain: No evidence of acute infarction, hemorrhage, hydrocephalus, extra-axial collection or mass lesion/mass effect. Generalized atrophy. Vascular: Atherosclerotic calcification. Skull: Normal. Negative for fracture or focal lesion. Prominent TMJ degenerative spurring. Sinuses/Orbits: No evidence of injury. CT CERVICAL SPINE FINDINGS Alignment: Reversal of  cervical lordosis with slight C4-5 anterolisthesis and C5-6 retrolisthesis Skull base and vertebrae: No acute fracture. Sclerosis on both sides of the C5-6 disc space attributed to degeneration. Soft tissues and spinal canal: Subcutaneous stranding seen posterior to the left shoulder and the lower neck. Disc levels: Advanced and bulky degenerative facet spurring with C3-4 facet ankylosis. Advanced lower cervical degenerative disc narrowing and ridging with foraminal impingement. C3-4 ACDF with solid arthrodesis. Upper chest: No acute finding IMPRESSION: Head CT: No evidence of intracranial injury. Cervical spine CT 1. Subcutaneous contusions posterior to the left shoulder and lower cervical spine. 2. No acute fracture. 3. Advanced degenerative disease. Electronically Signed   By: Marnee Spring M.D.   On: 11/03/2019 05:05   DG Chest Port 1 View  Result Date: 11/03/2019 CLINICAL DATA:  Level 2 trauma with right forearm laceration. EXAM: PORTABLE CHEST 1 VIEW COMPARISON:  None. FINDINGS: Low volume chest with interstitial crowding below the hila. There is no edema, consolidation, effusion, or pneumothorax. Normal heart size and mediastinal contours for rotation. IMPRESSION: No acute finding. Electronically Signed   By: Marnee Spring M.D.   On: 11/03/2019 04:10   DG Humerus Left  Result Date: 11/03/2019 CLINICAL DATA:  Level 2 trauma.  Left mid humerus bruising. EXAM: LEFT HUMERUS - 2+ VIEW COMPARISON:  None. FINDINGS: No acute fracture or malalignment.  No opaque foreign body. IMPRESSION: Negative for fracture Electronically Signed   By: Marnee Spring M.D.   On: 11/03/2019 04:11   DG HIP UNILAT WITH PELVIS 2-3 VIEWS RIGHT  Result Date: 11/03/2019 CLINICAL DATA:  Level 2 trauma.  Right hip pain EXAM: DG HIP (WITH OR WITHOUT PELVIS) 2-3V RIGHT COMPARISON:  None. FINDINGS: Soft tissue stranding lateral to the right hip. The right hip is foreshortened on all images, significantly limiting sensitivity. No  detected hip or pelvic ring fracture. Osteopenia and atherosclerosis. IMPRESSION: 1. Contusion lateral to the right hip. 2. No visible fracture but limited by foreshortening of the femoral neck on all of the images, please have low threshold for additional imaging if ongoing clinical concern for fracture. Electronically Signed   By: Marnee Spring M.D.   On: 11/03/2019 04:10    Procedures Procedures (including critical care time) CRITICAL CARE Performed by: Arnoldo Hooker   Total critical care time: 40 minutes  Critical care time was exclusive of separately billable procedures and treating other patients.  Critical care was necessary to treat or prevent imminent or life-threatening deterioration.  Critical care was time spent personally by me on the following activities: development of treatment plan with patient and/or surrogate as well as nursing, discussions with consultants, evaluation of patient's response to treatment, examination of patient, obtaining history from patient or surrogate, ordering and performing treatments and interventions, ordering and review of laboratory studies, ordering and review of radiographic studies, pulse oximetry and re-evaluation of patient's condition.  Medications Ordered in ED Medications  fentaNYL (SUBLIMAZE) injection 50 mcg (has no administration in time range)  Tdap (BOOSTRIX) injection 0.5 mL (0.5 mLs Intramuscular Given 11/03/19 0421)  iohexol (OMNIPAQUE) 300 MG/ML solution 100 mL (100 mLs Intravenous Contrast Given 11/03/19 0437)    ED Course  I have reviewed the triage vital signs and the nursing  notes.  Pertinent labs & imaging results that were available during my care of the patient were reviewed by me and considered in my medical decision making (see chart for details).    MDM Rules/Calculators/A&P                      Patient to ED from home after fall he reports as mechanical. No fractures identified. Family is now at bedside.    Consideration given to patient's ability to provide an accurate history. He has multiple bruises in various stages of healing. Per family member, he appears at his baseline.   During his ED encounter the patient requests pain and nausea relief. He subsequently tolerates PO fluids. On ambulation, the patient becomes dizzy, his nausea returns, he is SOB and he becomes diaphoretic. Additional fluids ordered. Labs are added. Repeat EKG shows no changes of ischemia.   Patient care signed out to Huntley Dec, PA-C, pending re-evaluation. Anticipate admission with exertional symptoms.  Final Clinical Impression(s) / ED Diagnoses Final diagnoses:  Trauma   1. Fall 2. Exertional dyspnea  Rx / DC Orders ED Discharge Orders    None       Elpidio Anis, PA-C 11/05/19 [id number]    Ward, Layla Maw, DO 11/06/19 2308

## 2019-11-03 NOTE — ED Notes (Signed)
Bacitracin, nonadherent pad, and kerlex placed onto R forearm skin tear

## 2019-11-03 NOTE — ED Notes (Addendum)
Saline gauze, ABD pad, and kerlex applied to R forearm lac/skin tear

## 2019-11-03 NOTE — ED Notes (Signed)
Pt taken to CT.

## 2019-11-03 NOTE — ED Notes (Signed)
Dinner Tray Ordered @ 1817. 

## 2019-11-03 NOTE — Progress Notes (Signed)
  Echocardiogram 2D Echocardiogram has been performed.  Leta Jungling M 11/03/2019, 12:07 PM

## 2019-11-04 ENCOUNTER — Other Ambulatory Visit: Payer: Self-pay

## 2019-11-04 ENCOUNTER — Encounter (HOSPITAL_COMMUNITY): Payer: Self-pay | Admitting: Internal Medicine

## 2019-11-04 DIAGNOSIS — R778 Other specified abnormalities of plasma proteins: Secondary | ICD-10-CM | POA: Diagnosis not present

## 2019-11-04 DIAGNOSIS — W19XXXA Unspecified fall, initial encounter: Secondary | ICD-10-CM

## 2019-11-04 DIAGNOSIS — R748 Abnormal levels of other serum enzymes: Secondary | ICD-10-CM | POA: Diagnosis not present

## 2019-11-04 DIAGNOSIS — T1490XA Injury, unspecified, initial encounter: Secondary | ICD-10-CM

## 2019-11-04 DIAGNOSIS — I739 Peripheral vascular disease, unspecified: Secondary | ICD-10-CM | POA: Diagnosis not present

## 2019-11-04 DIAGNOSIS — R7303 Prediabetes: Secondary | ICD-10-CM

## 2019-11-04 DIAGNOSIS — R0600 Dyspnea, unspecified: Secondary | ICD-10-CM | POA: Diagnosis not present

## 2019-11-04 DIAGNOSIS — I10 Essential (primary) hypertension: Secondary | ICD-10-CM | POA: Diagnosis not present

## 2019-11-04 DIAGNOSIS — Y92009 Unspecified place in unspecified non-institutional (private) residence as the place of occurrence of the external cause: Secondary | ICD-10-CM

## 2019-11-04 LAB — BASIC METABOLIC PANEL
Anion gap: 8 (ref 5–15)
BUN: 15 mg/dL (ref 8–23)
CO2: 21 mmol/L — ABNORMAL LOW (ref 22–32)
Calcium: 8.3 mg/dL — ABNORMAL LOW (ref 8.9–10.3)
Chloride: 101 mmol/L (ref 98–111)
Creatinine, Ser: 0.93 mg/dL (ref 0.61–1.24)
GFR calc Af Amer: >60 mL/min (ref >60)
GFR calc non Af Amer: >60 mL/min (ref >60)
Glucose, Bld: 102 mg/dL — ABNORMAL HIGH (ref 70–99)
Potassium: 3.8 mmol/L (ref 3.5–5.1)
Sodium: 130 mmol/L — ABNORMAL LOW (ref 135–145)

## 2019-11-04 LAB — CBC
HCT: 28.7 % — ABNORMAL LOW (ref 39.0–52.0)
Hemoglobin: 10.1 g/dL — ABNORMAL LOW (ref 13.0–17.0)
MCH: 32.2 pg (ref 26.0–34.0)
MCHC: 35.2 g/dL (ref 30.0–36.0)
MCV: 91.4 fL (ref 80.0–100.0)
Platelets: 216 10*3/uL (ref 150–400)
RBC: 3.14 MIL/uL — ABNORMAL LOW (ref 4.22–5.81)
RDW: 15.7 % — ABNORMAL HIGH (ref 11.5–15.5)
WBC: 6 10*3/uL (ref 4.0–10.5)
nRBC: 0 % (ref 0.0–0.2)

## 2019-11-04 LAB — CK: Total CK: 1148 U/L — ABNORMAL HIGH (ref 49–397)

## 2019-11-04 LAB — GLUCOSE, CAPILLARY: Glucose-Capillary: 111 mg/dL — ABNORMAL HIGH (ref 70–99)

## 2019-11-04 MED ORDER — SODIUM CHLORIDE 0.9 % IV SOLN: INTRAVENOUS | Status: AC

## 2019-11-04 MED ORDER — POLYETHYLENE GLYCOL 3350 17 G PO PACK
17.0000 g | PACK | Freq: Every day | ORAL | Status: DC
  Administered 2019-11-04: 17 g via ORAL
  Filled 2019-11-04 (×2): qty 1

## 2019-11-04 MED ORDER — BISACODYL 10 MG RE SUPP: 10.0000 mg | Freq: Every day | RECTAL | Status: DC | PRN

## 2019-11-04 NOTE — Progress Notes (Signed)
PROGRESS NOTE  [name] QQV:[id number] DOB: [birth year] DOA: 11/03/2019 PCP: Patient, No Pcp Per   LOS: 0 days   Brief narrative: As per HPI,  [name] is a 84 y.o. 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  Patient lives at home and needs looks after him.  At baseline patient uses a cane for ambulation and has a motorized wheelchair.  He does have history of stroke and uses braces for his right arm. Since moving into his new house 3 months ago that has hardwood floors instead of carpet he is fallen 2 other times..  Patient reports that he had gotten up from his recliner around 7 PM, and slipped falling into the TV console.  He laid on the floor, but was able to get to a phone around 1:30 AM. Patient does easily bruise as he is on Plavix.  Recently evaluated by Dr. Katrinka Blazing of cardiology for new right bundle branch block. ED Course: Upon admission into the emergency department, patient was seen as a level 2 trauma and was pan scanned.   He did reveal signs of contusions of the shoulders and right hip without any acute fractures.  Patient was also noted to have signs of aortic and coronary atherosclerotic disease.  Orthostatic vital signs were noted to be within normal limits.  Labs significant for WBC 18.5, hemoglobin 12.6, RDW 15.8, glucose 143, CK 416, troponin 387, and BNP 38.5.  Patient had been given fentanyl, 500 mL of normal saline, and Tdap booster.    Patient was then admitted to the hospital.  Assessment/Plan:  Principal Problem:   Elevated troponin Active Problems:   PVD (peripheral vascular disease) (HCC)   History of CVA with residual deficit   Elevated CK   Fall at home, initial encounter   Prediabetes   Essential hypertension   Falls, contusions, shoulder and hip: Secondary to mechanical fall.  Patient was not orthostatic.[name]    Physical  therapy has seen the patient.  At this time the family wishes home with home health patient also wishes to go home with home health on discharge.  History of right bundle branch block, and bradycardia: Seen by cardiology on 3/5.    By cardiology during hospitalization as well.  Only recommendation is discontinuation of HCTZ.  Heart rate ranging from 57-72.  Spoke with cardiology at bedside.  TSH within normal limits.  Mild elevated troponins.  Flat.  No chest pain.  No history of CAD.  2D echo showed ejection fraction of 60 to 65% with no wall motion abnormality.  Possibility demand ischemia  Essential hypertension: Blood pressures appear stable.  Continue Coreg lisinopril.  Cardiology recommends against HCTZ.  Mild elevated CK level.  Possible mild rhabdomyolysis.  Secondary to fall.  Patient received gentle IV fluids in the ED.  CK level trended up today to 1.42.  Will provide gentle IV fluids.  Check level in a.m.  Mild hyponatremia.  Sodium of 130.  Continue normal saline today.  Check BMP in a.m.  History of peripheral vascular disease: Patient had Doppler performed last in July 2020 ultrasound which revealed right probable total occlusion noted in the external iliac artery, although proximal segment and mid and distal common iliac artery were not visualized. 75-99% stenosis noted in the distal superficial femoral artery. Left: 50-74% stenosis noted in the mid and distal external iliac artery. Distal occlusion noted in the  anterior tibial artery.  Currently on Plavix.  We will continue with that.  Prediabetes: Patient's initial glucose mildly elevated at 143.  Last hemoglobin A1c noted to be 6 in November 2020.   History of CVA with residual deficit: Patient previously had a stroke in 2001  with right-sided hemiparesis.    Has a motorized wheelchair at home.  Has been seen by physical therapy here in the hospital.  Hyperlipidemia -Continue Crestor  VTE Prophylaxis: Heparin  subcu  Code Status: Full code  Family Communication: I spoke with the patient's niece on the phone at length.  Disposition Plan:  . Patient is from home . Likely disposition to home with home health in a.m. . Barriers to discharge: Home health set up/DME, family concerns.  Will discontinue HCTZ on discharge.   Consultants:  Cardiology  Procedures:  None  Antibiotics:  . None  Anti-infectives (From admission, onward)   None      Subjective: Today, patient was seen and examined at bedside.  Denies any shortness of breath, cough or chest pain.  Denies overt pain.  Objective: Vitals:   11/04/19 0802 11/04/19 1136  BP: 109/66 (!) 134/53  Pulse: 64 (!) 57  Resp:    Temp: 97.9 F (36.6 C) 98.1 F (36.7 C)  SpO2: 100% 98%    Intake/Output Summary (Last 24 hours) at 11/04/2019 1514 Last data filed at 11/04/2019 1312 Gross per 24 hour  Intake 50 ml  Output 1600 ml  Net -1550 ml   Filed Weights   11/03/19 0400 11/03/19 2105 11/04/19 0331  Weight: 81.6 kg 75.5 kg 75.6 kg   Body mass index is 26.9 kg/m.   Physical Exam: GENERAL: Patient is alert awake and communicative but with expressive dysarthria.  Hard of hearing.  Not in obvious distress. HENT: No scleral pallor or icterus. Pupils equally reactive to light. Oral mucosa is moist NECK: is supple, no gross swelling noted. CHEST: Clear to auscultation. No crackles or wheezes.  Diminished breath sounds bilaterally. CVS: S1 and S2 heard, no murmur. Regular rate and rhythm.  ABDOMEN: Soft, non-tender, bowel sounds are present. EXTREMITIES: No edema.  Right-sided weakness. CNS: Dysarthria, right-sided weakness from previous stroke. SKIN: warm and dry, bruising of the shoulders hip and left flank.  Data Review: I have personally reviewed the following laboratory data and studies,  CBC: Recent Labs  Lab 11/03/19 0406 11/03/19 0415 11/04/19 0321  WBC 18.5*  --  6.0  HGB 12.6* 13.3 10.1*  HCT 36.7* 39.0 28.7*   MCV 95.1  --  91.4  PLT 260  --  216   Basic Metabolic Panel: Recent Labs  Lab 11/03/19 0406 11/03/19 0415 11/04/19 0321  NA 134* 135 130*  K 3.5 3.5 3.8  CL 99 99 101  CO2 22  --  21*  GLUCOSE 143* 139* 102*  BUN 15 16 15   CREATININE 0.98 0.90 0.93  CALCIUM 9.1  --  8.3*   Liver Function Tests: Recent Labs  Lab 11/03/19 0406  AST 37  ALT 41  ALKPHOS 72  BILITOT 1.3*  PROT 6.2*  ALBUMIN 3.8   No results for input(s): LIPASE, AMYLASE in the last 168 hours. No results for input(s): AMMONIA in the last 168 hours. Cardiac Enzymes: Recent Labs  Lab 11/03/19 0406 11/04/19 0321  CKTOTAL 416* 1,148*   BNP (last 3 results) Recent Labs    11/03/19 0846  BNP 38.5    ProBNP (last 3 results) No results for input(s): PROBNP in the last  8760 hours.  CBG: Recent Labs  Lab 11/03/19 0357 11/03/19 0712  GLUCAP 137* 130*   Recent Results (from the past 240 hour(s))  SARS CORONAVIRUS 2 (TAT 6-24 HRS) Nasopharyngeal Nasopharyngeal Swab     Status: None   Collection Time: 11/03/19  8:57 AM   Specimen: Nasopharyngeal Swab  Result Value Ref Range Status   SARS Coronavirus 2 NEGATIVE NEGATIVE Final    Comment: (NOTE) SARS-CoV-2 target nucleic acids are NOT DETECTED. The SARS-CoV-2 RNA is generally detectable in upper and lower respiratory specimens during the acute phase of infection. Negative results do not preclude SARS-CoV-2 infection, do not rule out co-infections with other pathogens, and should not be used as the sole basis for treatment or other patient management decisions. Negative results must be combined with clinical observations, patient history, and epidemiological information. The expected result is Negative. Fact Sheet for Patients: HairSlick.no Fact Sheet for Healthcare Providers: quierodirigir.com This test is not yet approved or cleared by the [place] FDA and  has been authorized for  detection and/or diagnosis of SARS-CoV-2 by FDA under an Emergency Use Authorization (EUA). This EUA will remain  in effect (meaning this test can be used) for the duration of the COVID-19 declaration under Section 56 4(b)(1) of the Act, 21 U.S.C. section 360bbb-3(b)(1), unless the authorization is terminated or revoked sooner. Performed at [facility], [address], [place], [place] [address]      Studies: DG Forearm Right  Result Date: 11/03/2019 CLINICAL DATA:  Fall with forearm laceration EXAM: RIGHT FOREARM - 2 VIEW COMPARISON:  None. FINDINGS: Prominent forearm swelling with soft tissue gas that is deep and adjacent to a skin irregularity. No acute fracture. No opaque foreign body. Prominent osteopenia. IMPRESSION: Soft tissue swelling and deep soft tissue gas. No opaque foreign body or fracture. Electronically Signed   By: Marnee Spring M.D.   On: 11/03/2019 04:12   CT HEAD WO CONTRAST  Result Date: 11/03/2019 CLINICAL DATA:  Head trauma and headache EXAM: CT HEAD WITHOUT CONTRAST CT CERVICAL SPINE WITHOUT CONTRAST TECHNIQUE: Multidetector CT imaging of the head and cervical spine was performed following the standard protocol without intravenous contrast. Multiplanar CT image reconstructions of the cervical spine were also generated. COMPARISON:  None available FINDINGS: CT HEAD FINDINGS Brain: No evidence of acute infarction, hemorrhage, hydrocephalus, extra-axial collection or mass lesion/mass effect. Generalized atrophy. Vascular: Atherosclerotic calcification. Skull: Normal. Negative for fracture or focal lesion. Prominent TMJ degenerative spurring. Sinuses/Orbits: No evidence of injury. CT CERVICAL SPINE FINDINGS Alignment: Reversal of cervical lordosis with slight C4-5 anterolisthesis and C5-6 retrolisthesis Skull base and vertebrae: No acute fracture. Sclerosis on both sides of the C5-6 disc space attributed to degeneration. Soft tissues and spinal canal: Subcutaneous  stranding seen posterior to the left shoulder and the lower neck. Disc levels: Advanced and bulky degenerative facet spurring with C3-4 facet ankylosis. Advanced lower cervical degenerative disc narrowing and ridging with foraminal impingement. C3-4 ACDF with solid arthrodesis. Upper chest: No acute finding IMPRESSION: Head CT: No evidence of intracranial injury. Cervical spine CT 1. Subcutaneous contusions posterior to the left shoulder and lower cervical spine. 2. No acute fracture. 3. Advanced degenerative disease. Electronically Signed   By: Marnee Spring M.D.   On: 11/03/2019 05:05   CT CHEST W CONTRAST  Result Date: 11/03/2019 CLINICAL DATA:  Level 2 trauma due to fall. EXAM: CT CHEST, ABDOMEN, AND PELVIS WITH CONTRAST TECHNIQUE: Multidetector CT imaging of the chest, abdomen and pelvis was performed following the  standard protocol during bolus administration of intravenous contrast. CONTRAST:  175mL OMNIPAQUE IOHEXOL 300 MG/ML  SOLN COMPARISON:  None. FINDINGS: CT CHEST FINDINGS Cardiovascular: Normal heart size. No pericardial effusion. No evidence of great vessel injury. Multifocal aortic and coronary atherosclerosis. Streak artifact affects pulmonary arteries with no convincing filling defect. Mediastinum/Nodes: No evidence of mass or adenopathy. Lungs/Pleura: No evidence of hemothorax, pneumothorax, or lung contusion. Subpleural nodules along the right minor fissure attributed to lymph nodes given location and shape. Mild dependent atelectasis Musculoskeletal: Soft tissue injury to the right forearm with swelling and deep space gas, known from radiography. Right shoulder girdle musculature is atrophic compared to the left. Subcutaneous contusion posteriorly to both scapulae. No acute fracture. CT ABDOMEN PELVIS FINDINGS Hepatobiliary: No hepatic injury or perihepatic hematoma. Gallbladder is unremarkable Pancreas: Negative Spleen: No splenic injury or perisplenic hematoma. Adrenals/Urinary Tract: No  adrenal hemorrhage or renal injury identified. Multiple bilateral renal cysts with simple density. The largest emanates from the interpolar left kidney at 8.5 cm. Uncomplicated appearing diverticulum extending leftward from the bladder. Stomach/Bowel: No evidence of injury.  Diffuse formed stool Vascular/Lymphatic: Atherosclerosis with chronic occlusion of the right common iliac artery. The distal right external iliac reconstitutes. Reproductive: Negative Other: No evidence of ascites or pneumoperitoneum Musculoskeletal: Subcutaneous reticulation lateral to the right hip. No associated hip fracture. IMPRESSION: 1. No evidence of intrathoracic or intra-abdominal injury. 2. Contusions posterior to the shoulders and lateral to the right hip. There is prominent swelling with soft tissue gas at the right forearm. No acute fracture. 3. Chronic and incidental findings noted above. Electronically Signed   By: Monte Fantasia M.D.   On: 11/03/2019 05:19   CT CERVICAL SPINE WO CONTRAST  Result Date: 11/03/2019 CLINICAL DATA:  Head trauma and headache EXAM: CT HEAD WITHOUT CONTRAST CT CERVICAL SPINE WITHOUT CONTRAST TECHNIQUE: Multidetector CT imaging of the head and cervical spine was performed following the standard protocol without intravenous contrast. Multiplanar CT image reconstructions of the cervical spine were also generated. COMPARISON:  None available FINDINGS: CT HEAD FINDINGS Brain: No evidence of acute infarction, hemorrhage, hydrocephalus, extra-axial collection or mass lesion/mass effect. Generalized atrophy. Vascular: Atherosclerotic calcification. Skull: Normal. Negative for fracture or focal lesion. Prominent TMJ degenerative spurring. Sinuses/Orbits: No evidence of injury. CT CERVICAL SPINE FINDINGS Alignment: Reversal of cervical lordosis with slight C4-5 anterolisthesis and C5-6 retrolisthesis Skull base and vertebrae: No acute fracture. Sclerosis on both sides of the C5-6 disc space attributed to  degeneration. Soft tissues and spinal canal: Subcutaneous stranding seen posterior to the left shoulder and the lower neck. Disc levels: Advanced and bulky degenerative facet spurring with C3-4 facet ankylosis. Advanced lower cervical degenerative disc narrowing and ridging with foraminal impingement. C3-4 ACDF with solid arthrodesis. Upper chest: No acute finding IMPRESSION: Head CT: No evidence of intracranial injury. Cervical spine CT 1. Subcutaneous contusions posterior to the left shoulder and lower cervical spine. 2. No acute fracture. 3. Advanced degenerative disease. Electronically Signed   By: Monte Fantasia M.D.   On: 11/03/2019 05:05   CT ABDOMEN PELVIS W CONTRAST  Result Date: 11/03/2019 CLINICAL DATA:  Level 2 trauma due to fall. EXAM: CT CHEST, ABDOMEN, AND PELVIS WITH CONTRAST TECHNIQUE: Multidetector CT imaging of the chest, abdomen and pelvis was performed following the standard protocol during bolus administration of intravenous contrast. CONTRAST:  138mL OMNIPAQUE IOHEXOL 300 MG/ML  SOLN COMPARISON:  None. FINDINGS: CT CHEST FINDINGS Cardiovascular: Normal heart size. No pericardial effusion. No evidence of great vessel injury. Multifocal aortic and  coronary atherosclerosis. Streak artifact affects pulmonary arteries with no convincing filling defect. Mediastinum/Nodes: No evidence of mass or adenopathy. Lungs/Pleura: No evidence of hemothorax, pneumothorax, or lung contusion. Subpleural nodules along the right minor fissure attributed to lymph nodes given location and shape. Mild dependent atelectasis Musculoskeletal: Soft tissue injury to the right forearm with swelling and deep space gas, known from radiography. Right shoulder girdle musculature is atrophic compared to the left. Subcutaneous contusion posteriorly to both scapulae. No acute fracture. CT ABDOMEN PELVIS FINDINGS Hepatobiliary: No hepatic injury or perihepatic hematoma. Gallbladder is unremarkable Pancreas: Negative Spleen: No  splenic injury or perisplenic hematoma. Adrenals/Urinary Tract: No adrenal hemorrhage or renal injury identified. Multiple bilateral renal cysts with simple density. The largest emanates from the interpolar left kidney at 8.5 cm. Uncomplicated appearing diverticulum extending leftward from the bladder. Stomach/Bowel: No evidence of injury.  Diffuse formed stool Vascular/Lymphatic: Atherosclerosis with chronic occlusion of the right common iliac artery. The distal right external iliac reconstitutes. Reproductive: Negative Other: No evidence of ascites or pneumoperitoneum Musculoskeletal: Subcutaneous reticulation lateral to the right hip. No associated hip fracture. IMPRESSION: 1. No evidence of intrathoracic or intra-abdominal injury. 2. Contusions posterior to the shoulders and lateral to the right hip. There is prominent swelling with soft tissue gas at the right forearm. No acute fracture. 3. Chronic and incidental findings noted above. Electronically Signed   By: Marnee Spring M.D.   On: 11/03/2019 05:19   DG Chest Port 1 View  Result Date: 11/03/2019 CLINICAL DATA:  Level 2 trauma with right forearm laceration. EXAM: PORTABLE CHEST 1 VIEW COMPARISON:  None. FINDINGS: Low volume chest with interstitial crowding below the hila. There is no edema, consolidation, effusion, or pneumothorax. Normal heart size and mediastinal contours for rotation. IMPRESSION: No acute finding. Electronically Signed   By: Marnee Spring M.D.   On: 11/03/2019 04:10   DG Humerus Left  Result Date: 11/03/2019 CLINICAL DATA:  Level 2 trauma.  Left mid humerus bruising. EXAM: LEFT HUMERUS - 2+ VIEW COMPARISON:  None. FINDINGS: No acute fracture or malalignment.  No opaque foreign body. IMPRESSION: Negative for fracture Electronically Signed   By: Marnee Spring M.D.   On: 11/03/2019 04:11   ECHOCARDIOGRAM COMPLETE  Result Date: 11/03/2019    ECHOCARDIOGRAM REPORT   Patient Name:   HIRAN LEARD Date of Exam: 11/03/2019  Medical Rec #:  [id number]       Height:       72.0 in Accession #:    [id number]      Weight:       180.0 lb Date of Birth:  1935-10-20       BSA:          2.037 m Patient Age:    84 years        BP:           143/70 mmHg Patient Gender: M               HR:           64 bpm. Exam Location:  Inpatient Procedure: 2D Echo Indications:    Elevated Troponin  History:        Patient has no prior history of Echocardiogram examinations. PAD                 and Stroke, Arrythmias:RBBB; Risk Factors:Hypertension and                 Dyslipidemia. Fall at home.  Sonographer:  Leta Jungling RDCS Referring Phys: [id number] RONDELL A SMITH  Sonographer Comments: Technically challenging study due to limited acoustic windows. Fixed joints within the right arm IMPRESSIONS  1. Left ventricular ejection fraction, by estimation, is 60 to 65%. The left ventricle has normal function. The left ventricle has no regional wall motion abnormalities. Left ventricular diastolic parameters are consistent with Grade I diastolic dysfunction (impaired relaxation).  2. Right ventricular systolic function was not well visualized. The right ventricular size is not well visualized.  3. The mitral valve is grossly normal. No evidence of mitral valve regurgitation.  4. The aortic valve was not well visualized. Aortic valve regurgitation is not visualized. FINDINGS  Left Ventricle: Left ventricular ejection fraction, by estimation, is 60 to 65%. The left ventricle has normal function. The left ventricle has no regional wall motion abnormalities. The left ventricular internal cavity size was normal in size. There is  no left ventricular hypertrophy. Left ventricular diastolic parameters are consistent with Grade I diastolic dysfunction (impaired relaxation). Indeterminate filling pressures. Right Ventricle: The right ventricular size is not well visualized. Right vetricular wall thickness was not assessed. Right ventricular systolic function was not well  visualized. Left Atrium: Left atrial size was normal in size. Right Atrium: Right atrial size was normal in size. Pericardium: There is no evidence of pericardial effusion. Mitral Valve: The mitral valve is grossly normal. No evidence of mitral valve regurgitation. Tricuspid Valve: The tricuspid valve is grossly normal. Tricuspid valve regurgitation is trivial. Aortic Valve: The aortic valve was not well visualized. Aortic valve regurgitation is not visualized. Pulmonic Valve: The pulmonic valve was not well visualized. Pulmonic valve regurgitation is not visualized. Aorta: The aortic root and ascending aorta are structurally normal, with no evidence of dilitation. Venous: The inferior vena cava was not well visualized. IAS/Shunts: No atrial level shunt detected by color flow Doppler.   Diastology LV e' lateral:   7.18 cm/s LV E/e' lateral: 7.2 LV e' medial:    4.03 cm/s LV E/e' medial:  12.8  RIGHT VENTRICLE RV S prime:     16.10 cm/s TAPSE (M-mode): 2.2 cm LEFT ATRIUM           Index LA Vol (A4C): 32.3 ml 15.85 ml/m  AORTIC VALVE LVOT Vmax:   93.80 cm/s LVOT Vmean:  61.300 cm/s LVOT VTI:    0.189 m MITRAL VALVE MV Area (PHT): 1.89 cm    SHUNTS MV Decel Time: 401 msec    Systemic VTI: 0.19 m MV E velocity: 51.40 cm/s MV A velocity: 91.30 cm/s MV E/A ratio:  0.56 Zoila Shutter MD Electronically signed by Zoila Shutter MD Signature Date/Time: 11/03/2019/12:44:43 PM    Final    DG HIP UNILAT WITH PELVIS 2-3 VIEWS RIGHT  Result Date: 11/03/2019 CLINICAL DATA:  Level 2 trauma.  Right hip pain EXAM: DG HIP (WITH OR WITHOUT PELVIS) 2-3V RIGHT COMPARISON:  None. FINDINGS: Soft tissue stranding lateral to the right hip. The right hip is foreshortened on all images, significantly limiting sensitivity. No detected hip or pelvic ring fracture. Osteopenia and atherosclerosis. IMPRESSION: 1. Contusion lateral to the right hip. 2. No visible fracture but limited by foreshortening of the femoral neck on all of the images,  please have low threshold for additional imaging if ongoing clinical concern for fracture. Electronically Signed   By: Marnee Spring M.D.   On: 11/03/2019 04:10      Joycelyn Das, MD  [facility] 11/04/2019

## 2019-11-04 NOTE — Care Management Obs Status (Signed)
MEDICARE OBSERVATION STATUS NOTIFICATION   Patient Details  Name: [name] MRN: [id number] Date of Birth: Dec 24, 1935   Medicare Observation Status Notification Given:  Yes    Gala Lewandowsky, RN 11/04/2019, 3:23 PM

## 2019-11-04 NOTE — TOC Initial Note (Signed)
Transition of Care [facility]) - Initial/Assessment Note    Patient Details  Name: [name] MRN: [id number] Date of Birth: 16-Jan-1936  Transition of Care [facility]) CM/SW Contact:    Gala Lewandowsky, RN Phone Number: 11/04/2019, 4:21 PM  Clinical Narrative: Patient presented due to a fall in the home. Prior to arrival patient was from home alone; however has support of niece that lives next door. Patient has durable medical equipment: cane, motorized scooter, and niece felt like he had a shower chair. Per niece patient was independent with ADL's prior to coming into the hospital. Patient was still cooking breakfast and dinner and gets meals on wheels for lunch. Niece assists with getting medications from pharmacy. Patient takes medications without any problems. Physical/Occupational Therapy has worked with patient and recommendations are for home health- Medicare.gov list in the room and the niece is aware to go on Medicare.gov to look at agencies. Niece will then contact Case Manager with choice. Niece had questions regarding personal care services [facility]) and we discussed cost and that family assists with the PCS. We also discussed if patient was eligible for Medicaid- family to contact DSS to inquire about eligibility. Case Manager discussed observation status with family as well. Case Manager to follow for durable medical equipment and any additional transition of care needs.              Expected Discharge Plan: Home w Home Health Services Barriers to Discharge: No Barriers Identified   Patient Goals and CMS Choice Patient states their goals for this hospitalization and ongoing recovery are:: "to return home" CMS Medicare.gov Compare Post Acute Care list provided to:: Patient Choice offered to / list presented to : Patient  Expected Discharge Plan and Services Expected Discharge Plan: Home w Home Health Services In-house Referral: NA Discharge Planning Services: CM Consult Post  Acute Care Choice: Home Health Living arrangements for the past 2 months: Single Family Home                    HH Arranged: OT, PT   Prior Living Arrangements/Services Living arrangements for the past 2 months: Single Family Home Lives with:: Self(Surrounding family as neighbors.) Patient language and need for interpreter reviewed:: Yes Do you feel safe going back to the place where you live?: Yes      Need for Family Participation in Patient Care: Yes (Comment) Care giver support system in place?: Yes (comment) Current home services: DME, Home OT, Home PT Criminal Activity/Legal Involvement Pertinent to Current Situation/Hospitalization: No - Comment as needed  Activities of Daily Living Home Assistive Devices/Equipment: Cane (specify quad or straight), Eyeglasses, Hearing aid(Staight-bilateral hearing aids) ADL Screening (condition at time of admission) Patient's cognitive ability adequate to safely complete daily activities?: Yes Is the patient deaf or have difficulty hearing?: No Does the patient have difficulty seeing, even when wearing glasses/contacts?: No Does the patient have difficulty concentrating, remembering, or making decisions?: No Patient able to express need for assistance with ADLs?: Yes Does the patient have difficulty dressing or bathing?: Yes Independently performs ADLs?: Yes (appropriate for developmental age) Does the patient have difficulty walking or climbing stairs?: Yes Weakness of Legs: Right Weakness of Arms/Hands: Right  Permission Sought/Granted Permission sought to share information with : Family Supports, Oceanographer granted to share information with : Yes, Verbal Permission Granted      Emotional Assessment Appearance:: Appears stated age Attitude/Demeanor/Rapport: Engaged Affect (typically observed): Accepting Orientation: : Oriented to Self, Oriented to  Place, Oriented to  Time Alcohol / Substance Use: Not  Applicable Psych Involvement: No (comment)  Admission diagnosis:  Trauma [T14.90XA] Exertional dyspnea [R06.00] Elevated troponin [R77.8] Patient Active Problem List   Diagnosis Date Noted  . Elevated troponin 11/03/2019  . PVD (peripheral vascular disease) ([place]) 11/03/2019  . History of CVA with residual deficit 11/03/2019  . Elevated CK 11/03/2019  . Fall at home, initial encounter 11/03/2019  . Prediabetes 11/03/2019  . Essential hypertension 11/03/2019   PCP:  Patient, No Pcp Per Pharmacy:   [facility], [place] [address] [place] [address] Phone: [phone] Fax: [phone]  Readmission Risk Interventions No flowsheet data found.

## 2019-11-04 NOTE — Progress Notes (Addendum)
Progress Note  Patient Name: [name] Date of Encounter: 11/04/2019  Primary Cardiologist: Lesleigh Noe, MD   Subjective   Feeling well this morning. No complaints.   Inpatient Medications    Scheduled Meds: . clopidogrel  75 mg Oral Daily  . diltiazem  180 mg Oral Daily  . lisinopril  10 mg Oral Daily  . rosuvastatin  10 mg Oral Daily  . sodium chloride flush  3 mL Intravenous Q12H   Continuous Infusions:  PRN Meds: acetaminophen **OR** acetaminophen, albuterol, ondansetron **OR** ondansetron (ZOFRAN) IV   Vital Signs    Vitals:   11/03/19 2105 11/04/19 0020 11/04/19 0331 11/04/19 0802  BP: 108/66 107/64 113/68 109/66  Pulse: 64 65 62 64  Resp: 19 15 17    Temp: 99.1 F (37.3 C) 98.3 F (36.8 C) 98 F (36.7 C) 97.9 F (36.6 C)  TempSrc: Oral Oral Oral Oral  SpO2: 98% 99% 98% 100%  Weight: 75.5 kg  75.6 kg   Height: 5\' 6"  (1.676 m)       Intake/Output Summary (Last 24 hours) at 11/04/2019 0848 Last data filed at 11/04/2019 0341 Gross per 24 hour  Intake 50 ml  Output 1200 ml  Net -1150 ml   Last 3 Weights 11/04/2019 11/03/2019 11/03/2019  Weight (lbs) 166 lb 10.7 oz 166 lb 7.2 oz 180 lb  Weight (kg) 75.6 kg 75.5 kg 81.647 kg      Telemetry    SR - Personally Reviewed  ECG    No new tracing this morning.  Physical Exam  Pleasant older AAF, sitting up in bed.  GEN: No acute distress.   Neck: No JVD Cardiac: RRR, no murmurs, rubs, or gallops.  Respiratory: Clear to auscultation bilaterally. GI: Soft, nontender, non-distended  MS: No edema; No deformity. Brace on right arm.  Neuro:  Nonfocal  Psych: Normal affect   Labs    High Sensitivity Troponin:   Recent Labs  Lab 11/03/19 0836 11/03/19 1022 11/03/19 1649  TROPONINIHS 387* 420* 402*      Chemistry Recent Labs  Lab 11/03/19 0406 11/03/19 0415 11/04/19 0321  NA 134* 135 130*  K 3.5 3.5 3.8  CL 99 99 101  CO2 22  --  21*  GLUCOSE 143* 139* 102*  BUN 15 16 15     CREATININE 0.98 0.90 0.93  CALCIUM 9.1  --  8.3*  PROT 6.2*  --   --   ALBUMIN 3.8  --   --   AST 37  --   --   ALT 41  --   --   ALKPHOS 72  --   --   BILITOT 1.3*  --   --   GFRNONAA >60  --  >60  GFRAA >60  --  >60  ANIONGAP 13  --  8     Hematology Recent Labs  Lab 11/03/19 0406 11/03/19 0415 11/04/19 0321  WBC 18.5*  --  6.0  RBC 3.86*  --  3.14*  HGB 12.6* 13.3 10.1*  HCT 36.7* 39.0 28.7*  MCV 95.1  --  91.4  MCH 32.6  --  32.2  MCHC 34.3  --  35.2  RDW 15.8*  --  15.7*  PLT 260  --  216    BNP Recent Labs  Lab 11/03/19 0846  BNP 38.5     DDimer No results for input(s): DDIMER in the last 168 hours.   Radiology    DG Forearm Right  Result Date:  11/03/2019 CLINICAL DATA:  Fall with forearm laceration EXAM: RIGHT FOREARM - 2 VIEW COMPARISON:  None. FINDINGS: Prominent forearm swelling with soft tissue gas that is deep and adjacent to a skin irregularity. No acute fracture. No opaque foreign body. Prominent osteopenia. IMPRESSION: Soft tissue swelling and deep soft tissue gas. No opaque foreign body or fracture. Electronically Signed   By: Marnee Spring M.D.   On: 11/03/2019 04:12   CT HEAD WO CONTRAST  Result Date: 11/03/2019 CLINICAL DATA:  Head trauma and headache EXAM: CT HEAD WITHOUT CONTRAST CT CERVICAL SPINE WITHOUT CONTRAST TECHNIQUE: Multidetector CT imaging of the head and cervical spine was performed following the standard protocol without intravenous contrast. Multiplanar CT image reconstructions of the cervical spine were also generated. COMPARISON:  None available FINDINGS: CT HEAD FINDINGS Brain: No evidence of acute infarction, hemorrhage, hydrocephalus, extra-axial collection or mass lesion/mass effect. Generalized atrophy. Vascular: Atherosclerotic calcification. Skull: Normal. Negative for fracture or focal lesion. Prominent TMJ degenerative spurring. Sinuses/Orbits: No evidence of injury. CT CERVICAL SPINE FINDINGS Alignment: Reversal of  cervical lordosis with slight C4-5 anterolisthesis and C5-6 retrolisthesis Skull base and vertebrae: No acute fracture. Sclerosis on both sides of the C5-6 disc space attributed to degeneration. Soft tissues and spinal canal: Subcutaneous stranding seen posterior to the left shoulder and the lower neck. Disc levels: Advanced and bulky degenerative facet spurring with C3-4 facet ankylosis. Advanced lower cervical degenerative disc narrowing and ridging with foraminal impingement. C3-4 ACDF with solid arthrodesis. Upper chest: No acute finding IMPRESSION: Head CT: No evidence of intracranial injury. Cervical spine CT 1. Subcutaneous contusions posterior to the left shoulder and lower cervical spine. 2. No acute fracture. 3. Advanced degenerative disease. Electronically Signed   By: Marnee Spring M.D.   On: 11/03/2019 05:05   CT CHEST W CONTRAST  Result Date: 11/03/2019 CLINICAL DATA:  Level 2 trauma due to fall. EXAM: CT CHEST, ABDOMEN, AND PELVIS WITH CONTRAST TECHNIQUE: Multidetector CT imaging of the chest, abdomen and pelvis was performed following the standard protocol during bolus administration of intravenous contrast. CONTRAST:  OMNIPAQUE IOHEXOL 300 MG/ML  SOLN COMPARISON:  None. FINDINGS: CT CHEST FINDINGS Cardiovascular: Normal heart size. No pericardial effusion. No evidence of great vessel injury. Multifocal aortic and coronary atherosclerosis. Streak artifact affects pulmonary arteries with no convincing filling defect. Mediastinum/Nodes: No evidence of mass or adenopathy. Lungs/Pleura: No evidence of hemothorax, pneumothorax, or lung contusion. Subpleural nodules along the right minor fissure attributed to lymph nodes given location and shape. Mild dependent atelectasis Musculoskeletal: Soft tissue injury to the right forearm with swelling and deep space gas, known from radiography. Right shoulder girdle musculature is atrophic compared to the left. Subcutaneous contusion posteriorly to both  scapulae. No acute fracture. CT ABDOMEN PELVIS FINDINGS Hepatobiliary: No hepatic injury or perihepatic hematoma. Gallbladder is unremarkable Pancreas: Negative Spleen: No splenic injury or perisplenic hematoma. Adrenals/Urinary Tract: No adrenal hemorrhage or renal injury identified. Multiple bilateral renal cysts with simple density. The largest emanates from the interpolar left kidney at 8.5 cm. Uncomplicated appearing diverticulum extending leftward from the bladder. Stomach/Bowel: No evidence of injury.  Diffuse formed stool Vascular/Lymphatic: Atherosclerosis with chronic occlusion of the right common iliac artery. The distal right external iliac reconstitutes. Reproductive: Negative Other: No evidence of ascites or pneumoperitoneum Musculoskeletal: Subcutaneous reticulation lateral to the right hip. No associated hip fracture. IMPRESSION: 1. No evidence of intrathoracic or intra-abdominal injury. 2. Contusions posterior to the shoulders and lateral to the right hip. There is prominent swelling with soft  tissue gas at the right forearm. No acute fracture. 3. Chronic and incidental findings noted above. Electronically Signed   By: Marnee Spring M.D.   On: 11/03/2019 05:19   CT CERVICAL SPINE WO CONTRAST  Result Date: 11/03/2019 CLINICAL DATA:  Head trauma and headache EXAM: CT HEAD WITHOUT CONTRAST CT CERVICAL SPINE WITHOUT CONTRAST TECHNIQUE: Multidetector CT imaging of the head and cervical spine was performed following the standard protocol without intravenous contrast. Multiplanar CT image reconstructions of the cervical spine were also generated. COMPARISON:  None available FINDINGS: CT HEAD FINDINGS Brain: No evidence of acute infarction, hemorrhage, hydrocephalus, extra-axial collection or mass lesion/mass effect. Generalized atrophy. Vascular: Atherosclerotic calcification. Skull: Normal. Negative for fracture or focal lesion. Prominent TMJ degenerative spurring. Sinuses/Orbits: No evidence of  injury. CT CERVICAL SPINE FINDINGS Alignment: Reversal of cervical lordosis with slight C4-5 anterolisthesis and C5-6 retrolisthesis Skull base and vertebrae: No acute fracture. Sclerosis on both sides of the C5-6 disc space attributed to degeneration. Soft tissues and spinal canal: Subcutaneous stranding seen posterior to the left shoulder and the lower neck. Disc levels: Advanced and bulky degenerative facet spurring with C3-4 facet ankylosis. Advanced lower cervical degenerative disc narrowing and ridging with foraminal impingement. C3-4 ACDF with solid arthrodesis. Upper chest: No acute finding IMPRESSION: Head CT: No evidence of intracranial injury. Cervical spine CT 1. Subcutaneous contusions posterior to the left shoulder and lower cervical spine. 2. No acute fracture. 3. Advanced degenerative disease. Electronically Signed   By: Marnee Spring M.D.   On: 11/03/2019 05:05   CT ABDOMEN PELVIS W CONTRAST  Result Date: 11/03/2019 CLINICAL DATA:  Level 2 trauma due to fall. EXAM: CT CHEST, ABDOMEN, AND PELVIS WITH CONTRAST TECHNIQUE: Multidetector CT imaging of the chest, abdomen and pelvis was performed following the standard protocol during bolus administration of intravenous contrast. CONTRAST:  OMNIPAQUE IOHEXOL 300 MG/ML  SOLN COMPARISON:  None. FINDINGS: CT CHEST FINDINGS Cardiovascular: Normal heart size. No pericardial effusion. No evidence of great vessel injury. Multifocal aortic and coronary atherosclerosis. Streak artifact affects pulmonary arteries with no convincing filling defect. Mediastinum/Nodes: No evidence of mass or adenopathy. Lungs/Pleura: No evidence of hemothorax, pneumothorax, or lung contusion. Subpleural nodules along the right minor fissure attributed to lymph nodes given location and shape. Mild dependent atelectasis Musculoskeletal: Soft tissue injury to the right forearm with swelling and deep space gas, known from radiography. Right shoulder girdle musculature is  atrophic compared to the left. Subcutaneous contusion posteriorly to both scapulae. No acute fracture. CT ABDOMEN PELVIS FINDINGS Hepatobiliary: No hepatic injury or perihepatic hematoma. Gallbladder is unremarkable Pancreas: Negative Spleen: No splenic injury or perisplenic hematoma. Adrenals/Urinary Tract: No adrenal hemorrhage or renal injury identified. Multiple bilateral renal cysts with simple density. The largest emanates from the interpolar left kidney at 8.5 cm. Uncomplicated appearing diverticulum extending leftward from the bladder. Stomach/Bowel: No evidence of injury.  Diffuse formed stool Vascular/Lymphatic: Atherosclerosis with chronic occlusion of the right common iliac artery. The distal right external iliac reconstitutes. Reproductive: Negative Other: No evidence of ascites or pneumoperitoneum Musculoskeletal: Subcutaneous reticulation lateral to the right hip. No associated hip fracture. IMPRESSION: 1. No evidence of intrathoracic or intra-abdominal injury. 2. Contusions posterior to the shoulders and lateral to the right hip. There is prominent swelling with soft tissue gas at the right forearm. No acute fracture. 3. Chronic and incidental findings noted above. Electronically Signed   By: Marnee Spring M.D.   On: 11/03/2019 05:19   DG Chest Port 1 View  Result Date:  11/03/2019 CLINICAL DATA:  Level 2 trauma with right forearm laceration. EXAM: PORTABLE CHEST 1 VIEW COMPARISON:  None. FINDINGS: Low volume chest with interstitial crowding below the hila. There is no edema, consolidation, effusion, or pneumothorax. Normal heart size and mediastinal contours for rotation. IMPRESSION: No acute finding. Electronically Signed   By: Marnee Spring M.D.   On: 11/03/2019 04:10   DG Humerus Left  Result Date: 11/03/2019 CLINICAL DATA:  Level 2 trauma.  Left mid humerus bruising. EXAM: LEFT HUMERUS - 2+ VIEW COMPARISON:  None. FINDINGS: No acute fracture or malalignment.  No opaque foreign body.  IMPRESSION: Negative for fracture Electronically Signed   By: Marnee Spring M.D.   On: 11/03/2019 04:11   ECHOCARDIOGRAM COMPLETE  Result Date: 11/03/2019    ECHOCARDIOGRAM REPORT   Patient Name:   [name] Date of Exam: 11/03/2019 Medical Rec #:  [id number]       Height:       72.0 in Accession #:    [id number]      Weight:       180.0 lb Date of Birth:  08-26-1935       BSA:          2.037 m Patient Age:    84 years        BP:           143/70 mmHg Patient Gender: M               HR:           64 bpm. Exam Location:  Inpatient Procedure: 2D Echo Indications:    Elevated Troponin  History:        Patient has no prior history of Echocardiogram examinations. PAD                 and Stroke, Arrythmias:RBBB; Risk Factors:Hypertension and                 Dyslipidemia. Fall at home.  Sonographer:    Leta Jungling RDCS Referring Phys: [id number] RONDELL A SMITH  Sonographer Comments: Technically challenging study due to limited acoustic windows. Fixed joints within the right arm IMPRESSIONS  1. Left ventricular ejection fraction, by estimation, is 60 to 65%. The left ventricle has normal function. The left ventricle has no regional wall motion abnormalities. Left ventricular diastolic parameters are consistent with Grade I diastolic dysfunction (impaired relaxation).  2. Right ventricular systolic function was not well visualized. The right ventricular size is not well visualized.  3. The mitral valve is grossly normal. No evidence of mitral valve regurgitation.  4. The aortic valve was not well visualized. Aortic valve regurgitation is not visualized. FINDINGS  Left Ventricle: Left ventricular ejection fraction, by estimation, is 60 to 65%. The left ventricle has normal function. The left ventricle has no regional wall motion abnormalities. The left ventricular internal cavity size was normal in size. There is  no left ventricular hypertrophy. Left ventricular diastolic parameters are consistent with Grade I  diastolic dysfunction (impaired relaxation). Indeterminate filling pressures. Right Ventricle: The right ventricular size is not well visualized. Right vetricular wall thickness was not assessed. Right ventricular systolic function was not well visualized. Left Atrium: Left atrial size was normal in size. Right Atrium: Right atrial size was normal in size. Pericardium: There is no evidence of pericardial effusion. Mitral Valve: The mitral valve is grossly normal. No evidence of mitral valve regurgitation. Tricuspid Valve: The tricuspid valve is grossly normal. Tricuspid valve regurgitation is  trivial. Aortic Valve: The aortic valve was not well visualized. Aortic valve regurgitation is not visualized. Pulmonic Valve: The pulmonic valve was not well visualized. Pulmonic valve regurgitation is not visualized. Aorta: The aortic root and ascending aorta are structurally normal, with no evidence of dilitation. Venous: The inferior vena cava was not well visualized. IAS/Shunts: No atrial level shunt detected by color flow Doppler.   Diastology LV e' lateral:   7.18 cm/s LV E/e' lateral: 7.2 LV e' medial:    4.03 cm/s LV E/e' medial:  12.8  RIGHT VENTRICLE RV S prime:     16.10 cm/s TAPSE (M-mode): 2.2 cm LEFT ATRIUM           Index LA Vol (A4C): 32.3 ml 15.85 ml/m  AORTIC VALVE LVOT Vmax:   93.80 cm/s LVOT Vmean:  61.300 cm/s LVOT VTI:    0.189 m MITRAL VALVE MV Area (PHT): 1.89 cm    SHUNTS MV Decel Time: 401 msec    Systemic VTI: 0.19 m MV E velocity: 51.40 cm/s MV A velocity: 91.30 cm/s MV E/A ratio:  0.56 Lyman Bishop MD Electronically signed by Lyman Bishop MD Signature Date/Time: 11/03/2019/12:44:43 PM    Final    DG HIP UNILAT WITH PELVIS 2-3 VIEWS RIGHT  Result Date: 11/03/2019 CLINICAL DATA:  Level 2 trauma.  Right hip pain EXAM: DG HIP (WITH OR WITHOUT PELVIS) 2-3V RIGHT COMPARISON:  None. FINDINGS: Soft tissue stranding lateral to the right hip. The right hip is foreshortened on all images, significantly  limiting sensitivity. No detected hip or pelvic ring fracture. Osteopenia and atherosclerosis. IMPRESSION: 1. Contusion lateral to the right hip. 2. No visible fracture but limited by foreshortening of the femoral neck on all of the images, please have low threshold for additional imaging if ongoing clinical concern for fracture. Electronically Signed   By: Monte Fantasia M.D.   On: 11/03/2019 04:10    Cardiac Studies   Echo: 11/03/19  IMPRESSIONS    1. Left ventricular ejection fraction, by estimation, is 60 to 65%. The  left ventricle has normal function. The left ventricle has no regional  wall motion abnormalities. Left ventricular diastolic parameters are  consistent with Grade I diastolic  dysfunction (impaired relaxation).  2. Right ventricular systolic function was not well visualized. The right  ventricular size is not well visualized.  3. The mitral valve is grossly normal. No evidence of mitral valve  regurgitation.  4. The aortic valve was not well visualized. Aortic valve regurgitation  is not visualized.   Patient Profile     84 y.o. male with a hx of systemic hypertension, hyperlipidemia, CVA 2001 with right sided weakness, PAD, Prediabetes, abnormal EKG with right bundle, aortic atherosclerosis, B/L carotid disease, prominent voltage, and sinus bradycardia, who was seen for the evaluation of elevated troponin at the request of Dr Tamala Julian.  Assessment & Plan    1. Elevated Troponin - In the setting of a fall and elevated CK to 1148 - HS trop 387 > 420>>402 - Patient denied chest pain or sob. - He has no CAD history but very likely given PAD. RF for CAD include HTN, HLD, age. Denies family history of CAD - Echo showed EF 60-65%, no wall motion abnormalities, G1DD. - suspect demand ischemia in the setting of elevated CK. Can consider outpatient work up.   2. Falls - has had multiple falls since moving to wood floor.  - Imaging showed contusions on the right hip  and shoulders - plan for PT/OT  eval as he lives at home  3. PAD -Lower extremity angiography July 2020 showed Probable total occlusion noted in the external iliac artery, although proximal segment and mid and distal common iliac artery were not visualized.75-99% stenosis noted in the distal superficial femoral artery. On the left side 50-74% stenosis noted in the mid and distal external iliac artery. Distal occlusion noted in the anterior tibial artery. - plavix and statin  4. HTN - on lisinopril 10mg  daily and Dilt 180 mg daily. HCTZ was held on admission. Would DC with his age and freq falls.  - pressures reasonable - continue current regimen - Rates stable in the 60s  5. HLD - crestor 10mg  daily - LDL 75 in 02/2019  6. H/o stroke with residual deficit on the right side - plavix -  right ICA are consistent with a 1-39% stenosis. Left ICA are consistent with a 1-39% stenosis.  For questions or updates, please contact [facility] Please consult [email or web address] for contact info under      Signed, Laverda Page, NP  11/04/2019, 8:48 AM    Patient seen and examined. Agree with assessment and plan.  Maintaining sinus rhythm heart rate 58 to 59 bpm.  No chest pain.  Agree with discontinuance of hydrochlorothiazide.  Patient was on the floor for several hours contributing to possible muscle breakdown leading to CK increase.  Probable demand ischemia.  No ACS.  Cardiac stable from our perspective.  If significant bradycardia develops diltiazem can be switched to amlodipine.  Patient to follow-up with Dr. Verdis Prime for cardiology as outpatient   Lennette Bihari, MD, [facility] 11/04/2019 11:56 AM

## 2019-11-04 NOTE — Progress Notes (Signed)
PHYSICAL THERAPY EVALUATION  HISTORY OF CURRENT ILLNESS: 84 y/o male, lives home alone (niece next door), presented s/p fall at home (3rd this year). PMHx includes: arthritis, R bundle branch block and bradycardia, HTN, PVD, CVA w/ R side weakness. In ED had contusion of B shoulders and R hip, signs of aortic and corinary artherosclerotic disease. Pt admitted with elevated troponin.  CLINICAL IMPRESSION: Pt admitted with above diagnosis. PTA was living home alone, but niece is next door and multi family checks in on his during the day. Pt with hx of CVA and right sided weakness. He uses a cane in doors and scooter outside to run errands. Prior to this was quite independent, as per niece reporting, but has had 3 falls this year. Falls seem to be at same place in home and also seem to be of similar nature. Pt currently with functional limitations due to the deficits listed below (see PT Problem List). During this assessment pt has great difficulty hearing and understanding therapist, he usually reads lip and with use or masks had difficulty, therapist also attempted to write down instructions on mini white board but this did not have much better results. Pt has shoes with worn AFOs, needed mod a to donn them, c/o some pain in midsection with bending over to donn shoes. Pt was able to complete bed mob with mod a, needing assist to bring trunk up, sit<>stand also with mod and cane, he was able to ambulate approx 173ft with single tip cane and min a , with 2 standing rest breaks. Pt was on room air and noted on monitor readings dropped to low 80s with ambulation, but unsure of accuracy of this as probe was on finger pt reports has had difficulties with in past.  Pt will benefit from skilled PT to increase his overall strength, balance and coordination, activity tolerance,  independence and safety with mobility to allow discharge to the venue listed below.     D/C RECOMMENDATION: Home with out of bed  supervision/assistance and also Home Health PT.     11/04/19 1200  PT Visit Information  Last PT Received On 11/04/19  Assistance Needed +1  History of Present Illness 84 y/o male , lives home alone (niece next door), presented s/p fall at home (3rd this year). PMHx includes: arthritis, R bundle branch block and bradycardia, HTN, PVD, CVA w/ R side weakness. In ED had contusion of B shoulders and R hip, signs of aortic and corinary artherosclerotic disease. Pt admitted with elevated troponin.  Subjective Data  Patient Stated Goal family would like return home at [facility] and no falls  Precautions  Precautions Fall  Precaution Comments HOH  Required Braces or Orthoses Other Brace (AFO RLE)  Other Brace AFO RLE  Restrictions  Weight Bearing Restrictions No  Pain Assessment  Pain Assessment Faces  Faces Pain Scale 4  Pain Location states has pain in back, multi bruises across back  Pain Descriptors / Indicators Guarding;Discomfort  Pain Intervention(s) Limited activity within patient's tolerance;Monitored during session  Cognition  Arousal/Alertness Awake/alert  Behavior During Therapy Impulsive;Anxious  Overall Cognitive Status No family/caregiver present to determine baseline cognitive functioning  General Comments seems to be eratic and impulsive needing cues continued cues for safety but he may just be anxious in new environment w/o his normal supports  Bed Mobility  Overal bed mobility Needs Assistance  Bed Mobility Supine to Sit;Sit to Supine  Supine to sit Mod assist  Sit to supine Min guard  General  bed mobility comments needed assist with supine to sit with trunk elevation  Transfers  Overall transfer level Needs assistance  Equipment used Straight cane  Transfers Sit to/from Stand;Stand Pivot Transfers  Sit to Stand Mod assist  Stand pivot transfers Min assist  General transfer comment needs kine management and set up  Ambulation/Gait  Ambulation/Gait assistance Min  assist  Gait Distance (Feet) 160 Feet  Assistive device Straight cane  Gait Pattern/deviations Wide base of support;Ataxic (3 point step to pattern)  General Gait Details ambulated down hall approx 142ft with STC and min a, w 2 standing rest breaks to complete  Gait velocity dec  Balance  Overall balance assessment History of Falls;Needs assistance  Sitting-balance support Feet supported  Sitting balance-Leahy Scale Good  Sitting balance - Comments able to sit edge of bed w/o support and no LOB, able to assit with donning socks and shoes  Standing balance support During functional activity;Single extremity supported  Standing balance-Leahy Scale Fair  PT - End of Session  Equipment Utilized During Treatment Gait belt  Activity Tolerance Patient limited by fatigue  Patient left in bed;with call bell/phone within reach   PT - Assessment/Plan  PT Visit Diagnosis Other abnormalities of gait and mobility (R26.89);Unsteadiness on feet (R26.81);History of falling (Z91.81);Ataxic gait (R26.0)  PT Frequency (ACUTE ONLY) Min 3X/week  Recommendations for Other Services OT consult  Follow Up Recommendations Home health PT;Supervision for mobility/OOB  PT equipment 3in1 (PT)  AM-PAC PT "6 Clicks" Mobility Outcome Measure (Version 2)  Help needed turning from your back to your side while in a flat bed without using bedrails? 3  Help needed moving from lying on your back to sitting on the side of a flat bed without using bedrails? 2  Help needed moving to and from a bed to a chair (including a wheelchair)? 2  Help needed standing up from a chair using your arms (e.g., wheelchair or bedside chair)? 2  Help needed to walk in hospital room? 3  Help needed climbing 3-5 steps with a railing?  2  6 Click Score 14  Consider Recommendation of Discharge To: CIR/SNF/LTACH  Acute Rehab PT Goals  PT Goal Formulation With patient/family  Time For Goal Achievement 11/18/19  Potential to Achieve Goals Fair   PT Time Calculation  PT Start Time (ACUTE ONLY) 1203  PT Stop Time (ACUTE ONLY) 1248  PT Time Calculation (min) (ACUTE ONLY) 45 min  PT General Charges  $$ ACUTE PT VISIT 1 Visit  PT Evaluation  $PT Eval Moderate Complexity 1 Mod  PT Treatments  $Gait Training 8-22 mins  $Therapeutic Activity 8-22 mins    Drema Pry, PT

## 2019-11-05 DIAGNOSIS — R778 Other specified abnormalities of plasma proteins: Secondary | ICD-10-CM | POA: Diagnosis not present

## 2019-11-05 DIAGNOSIS — I10 Essential (primary) hypertension: Secondary | ICD-10-CM | POA: Diagnosis not present

## 2019-11-05 DIAGNOSIS — W19XXXA Unspecified fall, initial encounter: Secondary | ICD-10-CM | POA: Diagnosis not present

## 2019-11-05 DIAGNOSIS — R748 Abnormal levels of other serum enzymes: Secondary | ICD-10-CM | POA: Diagnosis not present

## 2019-11-05 LAB — BASIC METABOLIC PANEL
Anion gap: 9 (ref 5–15)
BUN: 15 mg/dL (ref 8–23)
CO2: 21 mmol/L — ABNORMAL LOW (ref 22–32)
Calcium: 8.5 mg/dL — ABNORMAL LOW (ref 8.9–10.3)
Chloride: 105 mmol/L (ref 98–111)
Creatinine, Ser: 1.05 mg/dL (ref 0.61–1.24)
GFR calc Af Amer: >60 mL/min (ref >60)
GFR calc non Af Amer: >60 mL/min (ref >60)
Glucose, Bld: 102 mg/dL — ABNORMAL HIGH (ref 70–99)
Potassium: 4 mmol/L (ref 3.5–5.1)
Sodium: 135 mmol/L (ref 135–145)

## 2019-11-05 LAB — CBC
HCT: 26.1 % — ABNORMAL LOW (ref 39.0–52.0)
Hemoglobin: 9 g/dL — ABNORMAL LOW (ref 13.0–17.0)
MCH: 32.1 pg (ref 26.0–34.0)
MCHC: 34.5 g/dL (ref 30.0–36.0)
MCV: 93.2 fL (ref 80.0–100.0)
Platelets: 192 10*3/uL (ref 150–400)
RBC: 2.8 MIL/uL — ABNORMAL LOW (ref 4.22–5.81)
RDW: 15.8 % — ABNORMAL HIGH (ref 11.5–15.5)
WBC: 7 10*3/uL (ref 4.0–10.5)
nRBC: 0 % (ref 0.0–0.2)

## 2019-11-05 LAB — MAGNESIUM: Magnesium: 2 mg/dL (ref 1.7–2.4)

## 2019-11-05 LAB — CK: Total CK: 1089 U/L — ABNORMAL HIGH (ref 49–397)

## 2019-11-05 MED ORDER — POLYETHYLENE GLYCOL 3350 17 G PO PACK: 17.0000 g | PACK | Freq: Every day | ORAL | 0 refills | Status: AC

## 2019-11-05 NOTE — Progress Notes (Signed)
PROGRESS NOTE  [name]:[id number] DOB: 04-13-1936 DOA: 11/03/2019 PCP: Patient, No Pcp Per   LOS: 0 days   Brief narrative: As per HPI,  [name] is a 84 y.o. 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  Patient lives at home and needs looks after him.  At baseline patient uses a cane for ambulation and has a motorized wheelchair.  He does have history of stroke and uses braces for his right arm. Since moving into his new house 3 months ago that has hardwood floors instead of carpet he is fallen 2 other times..  Patient reports that he had gotten up from his recliner around 7 PM, and slipped falling into the TV console.  He laid on the floor, but was able to get to a phone around 1:30 AM. Patient does easily bruise as he is on Plavix.  Recently evaluated by Dr. Tamala [place] of cardiology for new right bundle branch block. ED Course: Upon admission into the emergency department, patient was seen as a level 2 trauma and was pan scanned.   He did reveal signs of contusions of the shoulders and right hip without any acute fractures.  Patient was also noted to have signs of aortic and coronary atherosclerotic disease.  Orthostatic vital signs were noted to be within normal limits.  Labs significant for WBC 18.5, hemoglobin 12.6, RDW 15.8, glucose 143, CK 416, troponin 387, and BNP 38.5.  Patient had been given fentanyl, 500 mL of normal saline, and Tdap booster.    Patient was then admitted to the hospital.  Assessment/Plan:  Principal Problem:   Elevated troponin Active Problems:   PVD (peripheral vascular disease) (HCC)   History of CVA with residual deficit   Elevated CK   Fall at home, initial encounter   Prediabetes   Essential hypertension   Falls, contusions-shoulder and hip: Secondary to mechanical fall.  Patient was not orthostatic.[name]    Physical  therapy has seen the patient.  At this time the family wishes to proceed with inpatient rehab if possible.  Occupational therapy has seen the patient today and recommend CIR.  Will consult rehab.  History of right bundle branch block, and bradycardia: Seen by cardiology on 3/5.    Patient was seen by cardiology during this hospitalization as well.  Cardiology recommendation is discontinuation of HCTZ.  Heart rate ranging from 57-62.  TSH within normal limits.  Mild elevated troponins.  Flat.  No chest pain.  No history of CAD.  2D echo showed ejection fraction of 60 to 65% with no wall motion abnormality.  Possibility demand ischemia.  No further intervention planned as per cardiology.  Essential hypertension: Blood pressures appear stable.  Continue Coreg, lisinopril.  Cardiology recommends against HCTZ.  Mild elevated CK level.  Possible mild rhabdomyolysis.  Secondary to fall.  Patient received gentle IV fluids in the ED.  CK level trended up 2440>1027.  We will continue gentle IV fluids for 1 more day.  Recheck CK levels in a.m.  Mild hyponatremia.  Improved.  Sodium improved to 135 from 130 for normal saline hydration..  Continue normal saline for 1 more day.  History of peripheral vascular disease: Patient had Doppler performed last in July 2020 ultrasound which revealed right probable total occlusion noted in the external iliac artery, although proximal segment and mid and distal common iliac artery were not visualized. 75-99% stenosis noted  in the distal superficial femoral artery. Left: 50-74% stenosis noted in the mid and distal external iliac artery. Distal occlusion noted in the anterior tibial artery.  Continue Plavix  Prediabetes: Patient's initial glucose mildly elevated at 143.  Last hemoglobin A1c noted to be 6.0 in November 2020.   History of CVA with residual deficit: Patient previously had a stroke in 2001  with right-sided hemiparesis.    Has a motorized wheelchair at home.   Has been seen by physical therapy and occupational therapy here in the hospital.  Occupational therapy recommends CIR.  Spoke with the patient's niece at bedside.  Patient's niece  strongly wishes CIR evaluation.  Hyperlipidemia -Continue Crestor  VTE Prophylaxis: Heparin subcu  Code Status: Full code  Family Communication: I spoke with the patient's niece at bedside.  Disposition Plan:  . Patient is from home . Likely disposition to CIR.  Consultant. . Barriers to discharge: CIR evaluation.  Will discontinue HCTZ on discharge.   Consultants:  Cardiology  Procedures:  None  Antibiotics:  . None  Anti-infectives (From admission, onward)   None      Subjective: Today, patient was seen and examined at bedside.  Denies any shortness of breath, cough, fever chills.  Mild right upper extremity pain.  Objective: Vitals:   11/04/19 2033 11/05/19 0442  BP: 124/65 131/70  Pulse: 62   Resp: 18 17  Temp: 98.2 F (36.8 C) 98.1 F (36.7 C)  SpO2: 99% 99%    Intake/Output Summary (Last 24 hours) at 11/05/2019 1034 Last data filed at 11/04/2019 2000 Gross per 24 hour  Intake 240.42 ml  Output 1125 ml  Net -884.58 ml   Filed Weights   11/03/19 2105 11/04/19 0331 11/05/19 0442  Weight: 75.5 kg 75.6 kg 67.3 kg   Body mass index is 23.95 kg/m.   Physical Exam: GENERAL: Patient is alert awake and communicative but with expressive dysarthria.  Hard of hearing.  Not in obvious distress. HENT: No scleral pallor or icterus. Pupils equally reactive to light. Oral mucosa is moist NECK: is supple, no gross swelling noted. CHEST: Clear to auscultation. No crackles or wheezes.  Diminished breath sounds bilaterally. CVS: S1 and S2 heard, no murmur. Regular rate and rhythm.  ABDOMEN: Soft, non-tender, bowel sounds are present. EXTREMITIES: No edema.  Right-sided weakness with brace. CNS: Dysarthria, right-sided weakness from previous stroke. SKIN: warm and dry, bruising of the  shoulders hip and left flank.  Data Review: I have personally reviewed the following laboratory data and studies,  CBC: Recent Labs  Lab 11/03/19 0406 11/03/19 0415 11/04/19 0321 11/05/19 0426  WBC 18.5*  --  6.0 7.0  HGB 12.6* 13.3 10.1* 9.0*  HCT 36.7* 39.0 28.7* 26.1*  MCV 95.1  --  91.4 93.2  PLT 260  --  216 192   Basic Metabolic Panel: Recent Labs  Lab 11/03/19 0406 11/03/19 0415 11/04/19 0321 11/05/19 0426  NA 134* 135 130* 135  K 3.5 3.5 3.8 4.0  CL 99 99 101 105  CO2 22  --  21* 21*  GLUCOSE 143* 139* 102* 102*  BUN 15 16 15 15   CREATININE 0.98 0.90 0.93 1.05  CALCIUM 9.1  --  8.3* 8.5*  MG  --   --   --  2.0   Liver Function Tests: Recent Labs  Lab 11/03/19 0406  AST 37  ALT 41  ALKPHOS 72  BILITOT 1.3*  PROT 6.2*  ALBUMIN 3.8   No results for input(s): LIPASE, AMYLASE  in the last 168 hours. No results for input(s): AMMONIA in the last 168 hours. Cardiac Enzymes: Recent Labs  Lab 11/03/19 0406 11/04/19 0321 11/05/19 0426  CKTOTAL 416* 1,148* 1,089*   BNP (last 3 results) Recent Labs    11/03/19 0846  BNP 38.5    ProBNP (last 3 results) No results for input(s): PROBNP in the last 8760 hours.  CBG: Recent Labs  Lab 11/03/19 0357 11/03/19 0712 11/04/19 2116  GLUCAP 137* 130* 111*   Recent Results (from the past 240 hour(s))  SARS CORONAVIRUS 2 (TAT 6-24 HRS) Nasopharyngeal Nasopharyngeal Swab     Status: None   Collection Time: 11/03/19  8:57 AM   Specimen: Nasopharyngeal Swab  Result Value Ref Range Status   SARS Coronavirus 2 NEGATIVE NEGATIVE Final    Comment: (NOTE) SARS-CoV-2 target nucleic acids are NOT DETECTED. The SARS-CoV-2 RNA is generally detectable in upper and lower respiratory specimens during the acute phase of infection. Negative results do not preclude SARS-CoV-2 infection, do not rule out co-infections with other pathogens, and should not be used as the sole basis for treatment or other patient management  decisions. Negative results must be combined with clinical observations, patient history, and epidemiological information. The expected result is Negative. Fact Sheet for Patients: HairSlick.no Fact Sheet for Healthcare Providers: quierodirigir.com This test is not yet approved or cleared by the [place] FDA and  has been authorized for detection and/or diagnosis of SARS-CoV-2 by FDA under an Emergency Use Authorization (EUA). This EUA will remain  in effect (meaning this test can be used) for the duration of the COVID-19 declaration under Section 56 4(b)(1) of the Act, 21 U.S.C. section 360bbb-3(b)(1), unless the authorization is terminated or revoked sooner. Performed at [facility], [address], [place], [place] [address]      Studies: ECHOCARDIOGRAM COMPLETE  Result Date: 11/03/2019    ECHOCARDIOGRAM REPORT   Patient Name:   [name] Date of Exam: 11/03/2019 Medical Rec #:  [id number]       Height:       72.0 in Accession #:    [id number]      Weight:       180.0 lb Date of Birth:  12-31-1935       BSA:          2.037 m Patient Age:    84 years        BP:           143/70 mmHg Patient Gender: M               HR:           64 bpm. Exam Location:  Inpatient Procedure: 2D Echo Indications:    Elevated Troponin  History:        Patient has no prior history of Echocardiogram examinations. PAD                 and Stroke, Arrythmias:RBBB; Risk Factors:Hypertension and                 Dyslipidemia. Fall at home.  Sonographer:    Leta Jungling RDCS Referring Phys: [id number] RONDELL A SMITH  Sonographer Comments: Technically challenging study due to limited acoustic windows. Fixed joints within the right arm IMPRESSIONS  1. Left ventricular ejection fraction, by estimation, is 60 to 65%. The left ventricle has normal function. The left ventricle has no regional wall motion abnormalities. Left ventricular diastolic parameters are  consistent with Grade  I diastolic dysfunction (impaired relaxation).  2. Right ventricular systolic function was not well visualized. The right ventricular size is not well visualized.  3. The mitral valve is grossly normal. No evidence of mitral valve regurgitation.  4. The aortic valve was not well visualized. Aortic valve regurgitation is not visualized. FINDINGS  Left Ventricle: Left ventricular ejection fraction, by estimation, is 60 to 65%. The left ventricle has normal function. The left ventricle has no regional wall motion abnormalities. The left ventricular internal cavity size was normal in size. There is  no left ventricular hypertrophy. Left ventricular diastolic parameters are consistent with Grade I diastolic dysfunction (impaired relaxation). Indeterminate filling pressures. Right Ventricle: The right ventricular size is not well visualized. Right vetricular wall thickness was not assessed. Right ventricular systolic function was not well visualized. Left Atrium: Left atrial size was normal in size. Right Atrium: Right atrial size was normal in size. Pericardium: There is no evidence of pericardial effusion. Mitral Valve: The mitral valve is grossly normal. No evidence of mitral valve regurgitation. Tricuspid Valve: The tricuspid valve is grossly normal. Tricuspid valve regurgitation is trivial. Aortic Valve: The aortic valve was not well visualized. Aortic valve regurgitation is not visualized. Pulmonic Valve: The pulmonic valve was not well visualized. Pulmonic valve regurgitation is not visualized. Aorta: The aortic root and ascending aorta are structurally normal, with no evidence of dilitation. Venous: The inferior vena cava was not well visualized. IAS/Shunts: No atrial level shunt detected by color flow Doppler.   Diastology LV e' lateral:   7.18 cm/s LV E/e' lateral: 7.2 LV e' medial:    4.03 cm/s LV E/e' medial:  12.8  RIGHT VENTRICLE RV S prime:     16.10 cm/s TAPSE (M-mode): 2.2 cm LEFT  ATRIUM           Index LA Vol (A4C): 32.3 ml 15.85 ml/m  AORTIC VALVE LVOT Vmax:   93.80 cm/s LVOT Vmean:  61.300 cm/s LVOT VTI:    0.189 m MITRAL VALVE MV Area (PHT): 1.89 cm    SHUNTS MV Decel Time: 401 msec    Systemic VTI: 0.19 m MV E velocity: 51.40 cm/s MV A velocity: 91.30 cm/s MV E/A ratio:  0.56 Zoila Shutter MD Electronically signed by Zoila Shutter MD Signature Date/Time: 11/03/2019/12:44:43 PM    Final       Joycelyn Das, MD  [facility] 11/05/2019

## 2019-11-05 NOTE — TOC Transition Note (Signed)
Transition of Care [facility]) - CM/SW Discharge Note   Patient Details  Name: [name] MRN: [id number] Date of Birth: 03/30/1936  [facility] CM/SW Contact:  Gala Lewandowsky, RN Phone Number: 11/05/2019, 2:16 PM   Clinical Narrative:   Case Manager spoke with patient and niece regarding Occupational Therapy recommendations. We discussed the status of the patient. The plan will be for home with home health services. Medicare.gov list was provided to the patient and niece on 11-04-19. Patient and niece are both agreeable to home health services with [facility]. Referral made to [facility] and start of care to begin within 24-48 hours post transition home. Durable medical equipment 3n1 ordered and will be delivered to the room prior to transition home. Niece to provide transportation home. No further needs from Case Manager at this time.    Final next level of care: Home w Home Health Services Barriers to Discharge: No Barriers Identified   Patient Goals and CMS Choice Patient states their goals for this hospitalization and ongoing recovery are:: "to return home" CMS Medicare.gov Compare Post Acute Care list provided to:: Patient Choice offered to / list presented to : Patient    Discharge Plan and Services In-house Referral: NA Discharge Planning Services: CM Consult Post Acute Care Choice: Home Health            HH Arranged: PT, OT, Nurse's Aide HH Agency: [facility] (formerly [facility]) Date HH Agency Contacted: 11/05/19 Time HH Agency Contacted: 1414 Representative spoke with at [facility] Agency: Tiffany   Readmission Risk Interventions No flowsheet data found.

## 2019-11-05 NOTE — Discharge Summary (Signed)
Physician Discharge Summary  [name] BTD:[id number] DOB: [birth year] DOA: 11/03/2019  PCP: [name], No Pcp Per  Admit date: 11/03/2019 Discharge date: 11/05/2019  Admitted From: Home  Discharge disposition: Home with home health   Recommendations for Outpatient Follow-Up:    Follow up with your primary care provider in one week.   Check CBC, BMP in the next visit  Discharge Diagnosis:   Principal Problem:   Elevated troponin Active Problems:   PVD (peripheral vascular disease) (HCC)   History of CVA with residual deficit   Elevated CK   Fall at home, initial encounter   Prediabetes   Essential hypertension   Discharge Condition: Improved.  Diet recommendation: Low sodium, heart healthy.    Wound care: None.  Code status: Full.   History of Present Illness:   William L Tatumis a 84 y.o.malewith medical history significant ofhypertension, peripheral vascular disease, CVA in 2001with right-sided weaknesspresented to the hospital after sustaining a fall.  [name] lives at home and needs looks after him.  At baseline [name] uses a cane for ambulation and has a motorized wheelchair.  He does have history of stroke and uses braces for his right arm.Since moving into his new house 3 months ago that has hardwood floors instead of carpet he is fallen 2 other times.. [name] reports that he had gotten up from his recliner around 7 PM, and slipped falling into the TV console. He laid on the floor, but was able to get to a phone around 1:30 AM.[name] does easily bruise as he is on Plavix. Recently evaluated by Dr. Katrinka Blazing of cardiology for new right bundle branch block.   ED Course:Upon admission into the emergency department, [name] was seen as a level 2 trauma and was pan scanned.  He did reveal signs of contusions of the shoulders and right hip without any acute fractures. [name] was also noted to have signs of aortic and coronary atherosclerotic disease.  Orthostatic vital signs were noted to be within normal limits. Labs significant for WBC 18.5, hemoglobin 12.6, RDW 15.8, glucose 143, CK 416, troponin 387, and BNP 38.5.  [name] had been given fentanyl, 500 mL of normal saline, and Tdap booster.   [name] was then admitted to the hospital.   Hospital Course:   Following conditions were addressed during hospitalization as listed below,  Falls, contusions-shoulder and hip: Secondary to mechanical fall.  [name] was not orthostatic.[name]   Physical therapy has seen the [name].  At this time the family wishes to proceed with inpatient rehab if possible but [name] did not qualify for inpatient rehab..  At this time [name] will be discharged home with home health.  History of right bundle branch block, and bradycardia: Seen by cardiology on 3/5.   [name] was seen by cardiology during this hospitalization as well.  Cardiology recommendation is discontinuation of HCTZ.  Heart rate ranging from 57-62.  TSH within normal limits.  Mild elevated troponins.  Flat.  No chest pain.  No history of CAD.  2D echo showed ejection fraction of 60 to 65% with no wall motion abnormality.  Possibility demand ischemia.  No further intervention planned as per cardiology.  Essential hypertension: Blood pressures remained stable.  Continue Coreg, lisinopril.  Cardiology recommends against HCTZ.  Mild elevated CK level.  Possible mild rhabdomyolysis.  Secondary to fall.  [name] received gentle IV fluids in the ED.  CK level trended  1062>6948.    Increase oral fluids.  Mild hyponatremia.  Improved.  Sodium improved  to 135 from 130 after normal saline hydration.[name]   History of peripheral vascular disease: [name] had Doppler performed last in July 2020 ultrasound which revealed right probable total occlusion noted in the external iliac artery, although proximal segment and mid and distal common iliac artery were not visualized. 75-99% stenosis noted in the distal  superficial femoral artery. Left: 50-74% stenosis noted in the mid and distal external iliac artery. Distal occlusion noted in the anterior tibial artery.  Continue Plavix on discharge.  Prediabetes: [name]'s initial glucose mildly elevated at 143. Last hemoglobin A1c noted to be 6.0in November 2020.  History of CVA with residual deficit: [name] previously had a stroke in 2001  with right-sided hemiparesis.   Has a motorized wheelchair at home.  Has been seen by physical therapy and occupational therapy here in the hospital.  Occupational therapy recommends CIR.   at this time, disposition plan is home with home health.  Hyperlipidemia  -Continue Crestor  Disposition.  At this time, [name] is stable for disposition home with home health  Medical Consultants:    Cardiology  Procedures:   None  Subjective:   Today, [name] was seen and examined at bedside.  Denies any shortness of breath, cough, fever chills.    Complains of chronic mild right upper extremity pain.  Discharge Exam:   Vitals:   11/04/19 2033 11/05/19 0442  BP: 124/65 131/70  Pulse: 62   Resp: 18 17  Temp: 98.2 F (36.8 C) 98.1 F (36.7 C)  SpO2: 99% 99%   Vitals:   11/04/19 1136 11/04/19 1623 11/04/19 2033 11/05/19 0442  BP: (!) 134/53 (!) 107/59 124/65 131/70  Pulse: (!) 57 (!) 59 62   Resp:   18 17  Temp: 98.1 F (36.7 C) 98 F (36.7 C) 98.2 F (36.8 C) 98.1 F (36.7 C)  TempSrc: Oral Oral Oral Oral  SpO2: 98% 99% 99% 99%  Weight:    67.3 kg  Height:       GENERAL: [name] is alert awake and communicative but with expressive dysarthria.  Hard of hearing.  Not in obvious distress. HENT: No scleral pallor or icterus. Pupils equally reactive to light. Oral mucosa is moist NECK: is supple, no gross swelling noted. CHEST: Clear to auscultation. No crackles or wheezes.  Diminished breath sounds bilaterally. CVS: S1 and S2 heard, no murmur. Regular rate and rhythm.  ABDOMEN: Soft, non-tender,  bowel sounds are present. EXTREMITIES: No edema.  Right-sided weakness with brace. CNS: Dysarthria, right-sided weakness from previous stroke. SKIN: warm and dry, bruising of the shoulders hip and left flank.  The results of significant diagnostics from this hospitalization (including imaging, microbiology, ancillary and laboratory) are listed below for reference.     Diagnostic Studies:   DG Forearm Right  Result Date: 11/03/2019 CLINICAL DATA:  Fall with forearm laceration EXAM: RIGHT FOREARM - 2 VIEW COMPARISON:  None. FINDINGS: Prominent forearm swelling with soft tissue gas that is deep and adjacent to a skin irregularity. No acute fracture. No opaque foreign body. Prominent osteopenia. IMPRESSION: Soft tissue swelling and deep soft tissue gas. No opaque foreign body or fracture. Electronically Signed   By: Marnee Spring M.D.   On: 11/03/2019 04:12   CT HEAD WO CONTRAST  Result Date: 11/03/2019 CLINICAL DATA:  Head trauma and headache EXAM: CT HEAD WITHOUT CONTRAST CT CERVICAL SPINE WITHOUT CONTRAST TECHNIQUE: Multidetector CT imaging of the head and cervical spine was performed following the standard protocol without intravenous contrast. Multiplanar CT image reconstructions of  the cervical spine were also generated. COMPARISON:  None available FINDINGS: CT HEAD FINDINGS Brain: No evidence of acute infarction, hemorrhage, hydrocephalus, extra-axial collection or mass lesion/mass effect. Generalized atrophy. Vascular: Atherosclerotic calcification. Skull: Normal. Negative for fracture or focal lesion. Prominent TMJ degenerative spurring. Sinuses/Orbits: No evidence of injury. CT CERVICAL SPINE FINDINGS Alignment: Reversal of cervical lordosis with slight C4-5 anterolisthesis and C5-6 retrolisthesis Skull base and vertebrae: No acute fracture. Sclerosis on both sides of the C5-6 disc space attributed to degeneration. Soft tissues and spinal canal: Subcutaneous stranding seen posterior to the  left shoulder and the lower neck. Disc levels: Advanced and bulky degenerative facet spurring with C3-4 facet ankylosis. Advanced lower cervical degenerative disc narrowing and ridging with foraminal impingement. C3-4 ACDF with solid arthrodesis. Upper chest: No acute finding IMPRESSION: Head CT: No evidence of intracranial injury. Cervical spine CT 1. Subcutaneous contusions posterior to the left shoulder and lower cervical spine. 2. No acute fracture. 3. Advanced degenerative disease. Electronically Signed   By: Monte Fantasia M.D.   On: 11/03/2019 05:05   CT CHEST W CONTRAST  Result Date: 11/03/2019 CLINICAL DATA:  Level 2 trauma due to fall. EXAM: CT CHEST, ABDOMEN, AND PELVIS WITH CONTRAST TECHNIQUE: Multidetector CT imaging of the chest, abdomen and pelvis was performed following the standard protocol during bolus administration of intravenous contrast. CONTRAST:  132mL OMNIPAQUE IOHEXOL 300 MG/ML  SOLN COMPARISON:  None. FINDINGS: CT CHEST FINDINGS Cardiovascular: Normal heart size. No pericardial effusion. No evidence of great vessel injury. Multifocal aortic and coronary atherosclerosis. Streak artifact affects pulmonary arteries with no convincing filling defect. Mediastinum/Nodes: No evidence of mass or adenopathy. Lungs/Pleura: No evidence of hemothorax, pneumothorax, or lung contusion. Subpleural nodules along the right minor fissure attributed to lymph nodes given location and shape. Mild dependent atelectasis Musculoskeletal: Soft tissue injury to the right forearm with swelling and deep space gas, known from radiography. Right shoulder girdle musculature is atrophic compared to the left. Subcutaneous contusion posteriorly to both scapulae. No acute fracture. CT ABDOMEN PELVIS FINDINGS Hepatobiliary: No hepatic injury or perihepatic hematoma. Gallbladder is unremarkable Pancreas: Negative Spleen: No splenic injury or perisplenic hematoma. Adrenals/Urinary Tract: No adrenal hemorrhage or renal  injury identified. Multiple bilateral renal cysts with simple density. The largest emanates from the interpolar left kidney at 8.5 cm. Uncomplicated appearing diverticulum extending leftward from the bladder. Stomach/Bowel: No evidence of injury.  Diffuse formed stool Vascular/Lymphatic: Atherosclerosis with chronic occlusion of the right common iliac artery. The distal right external iliac reconstitutes. Reproductive: Negative Other: No evidence of ascites or pneumoperitoneum Musculoskeletal: Subcutaneous reticulation lateral to the right hip. No associated hip fracture. IMPRESSION: 1. No evidence of intrathoracic or intra-abdominal injury. 2. Contusions posterior to the shoulders and lateral to the right hip. There is prominent swelling with soft tissue gas at the right forearm. No acute fracture. 3. Chronic and incidental findings noted above. Electronically Signed   By: Monte Fantasia M.D.   On: 11/03/2019 05:19   CT CERVICAL SPINE WO CONTRAST  Result Date: 11/03/2019 CLINICAL DATA:  Head trauma and headache EXAM: CT HEAD WITHOUT CONTRAST CT CERVICAL SPINE WITHOUT CONTRAST TECHNIQUE: Multidetector CT imaging of the head and cervical spine was performed following the standard protocol without intravenous contrast. Multiplanar CT image reconstructions of the cervical spine were also generated. COMPARISON:  None available FINDINGS: CT HEAD FINDINGS Brain: No evidence of acute infarction, hemorrhage, hydrocephalus, extra-axial collection or mass lesion/mass effect. Generalized atrophy. Vascular: Atherosclerotic calcification. Skull: Normal. Negative for fracture or focal lesion.  Prominent TMJ degenerative spurring. Sinuses/Orbits: No evidence of injury. CT CERVICAL SPINE FINDINGS Alignment: Reversal of cervical lordosis with slight C4-5 anterolisthesis and C5-6 retrolisthesis Skull base and vertebrae: No acute fracture. Sclerosis on both sides of the C5-6 disc space attributed to degeneration. Soft tissues and  spinal canal: Subcutaneous stranding seen posterior to the left shoulder and the lower neck. Disc levels: Advanced and bulky degenerative facet spurring with C3-4 facet ankylosis. Advanced lower cervical degenerative disc narrowing and ridging with foraminal impingement. C3-4 ACDF with solid arthrodesis. Upper chest: No acute finding IMPRESSION: Head CT: No evidence of intracranial injury. Cervical spine CT 1. Subcutaneous contusions posterior to the left shoulder and lower cervical spine. 2. No acute fracture. 3. Advanced degenerative disease. Electronically Signed   By: Marnee Spring M.D.   On: 11/03/2019 05:05   CT ABDOMEN PELVIS W CONTRAST  Result Date: 11/03/2019 CLINICAL DATA:  Level 2 trauma due to fall. EXAM: CT CHEST, ABDOMEN, AND PELVIS WITH CONTRAST TECHNIQUE: Multidetector CT imaging of the chest, abdomen and pelvis was performed following the standard protocol during bolus administration of intravenous contrast. CONTRAST:  OMNIPAQUE IOHEXOL 300 MG/ML  SOLN COMPARISON:  None. FINDINGS: CT CHEST FINDINGS Cardiovascular: Normal heart size. No pericardial effusion. No evidence of great vessel injury. Multifocal aortic and coronary atherosclerosis. Streak artifact affects pulmonary arteries with no convincing filling defect. Mediastinum/Nodes: No evidence of mass or adenopathy. Lungs/Pleura: No evidence of hemothorax, pneumothorax, or lung contusion. Subpleural nodules along the right minor fissure attributed to lymph nodes given location and shape. Mild dependent atelectasis Musculoskeletal: Soft tissue injury to the right forearm with swelling and deep space gas, known from radiography. Right shoulder girdle musculature is atrophic compared to the left. Subcutaneous contusion posteriorly to both scapulae. No acute fracture. CT ABDOMEN PELVIS FINDINGS Hepatobiliary: No hepatic injury or perihepatic hematoma. Gallbladder is unremarkable Pancreas: Negative Spleen: No splenic injury or perisplenic  hematoma. Adrenals/Urinary Tract: No adrenal hemorrhage or renal injury identified. Multiple bilateral renal cysts with simple density. The largest emanates from the interpolar left kidney at 8.5 cm. Uncomplicated appearing diverticulum extending leftward from the bladder. Stomach/Bowel: No evidence of injury.  Diffuse formed stool Vascular/Lymphatic: Atherosclerosis with chronic occlusion of the right common iliac artery. The distal right external iliac reconstitutes. Reproductive: Negative Other: No evidence of ascites or pneumoperitoneum Musculoskeletal: Subcutaneous reticulation lateral to the right hip. No associated hip fracture. IMPRESSION: 1. No evidence of intrathoracic or intra-abdominal injury. 2. Contusions posterior to the shoulders and lateral to the right hip. There is prominent swelling with soft tissue gas at the right forearm. No acute fracture. 3. Chronic and incidental findings noted above. Electronically Signed   By: Marnee Spring M.D.   On: 11/03/2019 05:19   DG Chest Port 1 View  Result Date: 11/03/2019 CLINICAL DATA:  Level 2 trauma with right forearm laceration. EXAM: PORTABLE CHEST 1 VIEW COMPARISON:  None. FINDINGS: Low volume chest with interstitial crowding below the hila. There is no edema, consolidation, effusion, or pneumothorax. Normal heart size and mediastinal contours for rotation. IMPRESSION: No acute finding. Electronically Signed   By: Marnee Spring M.D.   On: 11/03/2019 04:10   DG Humerus Left  Result Date: 11/03/2019 CLINICAL DATA:  Level 2 trauma.  Left mid humerus bruising. EXAM: LEFT HUMERUS - 2+ VIEW COMPARISON:  None. FINDINGS: No acute fracture or malalignment.  No opaque foreign body. IMPRESSION: Negative for fracture Electronically Signed   By: Marnee Spring M.D.   On: 11/03/2019 04:11  ECHOCARDIOGRAM COMPLETE  Result Date: 11/03/2019    ECHOCARDIOGRAM REPORT   [name] Name:   NICCOLO BROUSE Date of Exam: 11/03/2019 Medical Rec #:  [id number]        Height:       72.0 in Accession #:    [id number]      Weight:       180.0 lb Date of Birth:  1936/05/06       BSA:          2.037 m [name] Age:    84 years        BP:           143/70 mmHg [name] Gender: M               HR:           64 bpm. Exam Location:  Inpatient Procedure: 2D Echo Indications:    Elevated Troponin  History:        [name] has no prior history of Echocardiogram examinations. PAD                 and Stroke, Arrythmias:RBBB; Risk Factors:Hypertension and                 Dyslipidemia. Fall at home.  Sonographer:    Leta Jungling RDCS Referring Phys: [id number] RONDELL A SMITH  Sonographer Comments: Technically challenging study due to limited acoustic windows. Fixed joints within the right arm IMPRESSIONS  1. Left ventricular ejection fraction, by estimation, is 60 to 65%. The left ventricle has normal function. The left ventricle has no regional wall motion abnormalities. Left ventricular diastolic parameters are consistent with Grade I diastolic dysfunction (impaired relaxation).  2. Right ventricular systolic function was not well visualized. The right ventricular size is not well visualized.  3. The mitral valve is grossly normal. No evidence of mitral valve regurgitation.  4. The aortic valve was not well visualized. Aortic valve regurgitation is not visualized. FINDINGS  Left Ventricle: Left ventricular ejection fraction, by estimation, is 60 to 65%. The left ventricle has normal function. The left ventricle has no regional wall motion abnormalities. The left ventricular internal cavity size was normal in size. There is  no left ventricular hypertrophy. Left ventricular diastolic parameters are consistent with Grade I diastolic dysfunction (impaired relaxation). Indeterminate filling pressures. Right Ventricle: The right ventricular size is not well visualized. Right vetricular wall thickness was not assessed. Right ventricular systolic function was not well visualized. Left Atrium: Left  atrial size was normal in size. Right Atrium: Right atrial size was normal in size. Pericardium: There is no evidence of pericardial effusion. Mitral Valve: The mitral valve is grossly normal. No evidence of mitral valve regurgitation. Tricuspid Valve: The tricuspid valve is grossly normal. Tricuspid valve regurgitation is trivial. Aortic Valve: The aortic valve was not well visualized. Aortic valve regurgitation is not visualized. Pulmonic Valve: The pulmonic valve was not well visualized. Pulmonic valve regurgitation is not visualized. Aorta: The aortic root and ascending aorta are structurally normal, with no evidence of dilitation. Venous: The inferior vena cava was not well visualized. IAS/Shunts: No atrial level shunt detected by color flow Doppler.   Diastology LV e' lateral:   7.18 cm/s LV E/e' lateral: 7.2 LV e' medial:    4.03 cm/s LV E/e' medial:  12.8  RIGHT VENTRICLE RV S prime:     16.10 cm/s TAPSE (M-mode): 2.2 cm LEFT ATRIUM           Index LA Vol (  A4C): 32.3 ml 15.85 ml/m  AORTIC VALVE LVOT Vmax:   93.80 cm/s LVOT Vmean:  61.300 cm/s LVOT VTI:    0.189 m MITRAL VALVE MV Area (PHT): 1.89 cm    SHUNTS MV Decel Time: 401 msec    Systemic VTI: 0.19 m MV E velocity: 51.40 cm/s MV A velocity: 91.30 cm/s MV E/A ratio:  0.56 Zoila Shutter MD Electronically signed by Zoila Shutter MD Signature Date/Time: 11/03/2019/12:44:43 PM    Final    DG HIP UNILAT WITH PELVIS 2-3 VIEWS RIGHT  Result Date: 11/03/2019 CLINICAL DATA:  Level 2 trauma.  Right hip pain EXAM: DG HIP (WITH OR WITHOUT PELVIS) 2-3V RIGHT COMPARISON:  None. FINDINGS: Soft tissue stranding lateral to the right hip. The right hip is foreshortened on all images, significantly limiting sensitivity. No detected hip or pelvic ring fracture. Osteopenia and atherosclerosis. IMPRESSION: 1. Contusion lateral to the right hip. 2. No visible fracture but limited by foreshortening of the femoral neck on all of the images, please have low threshold for  additional imaging if ongoing clinical concern for fracture. Electronically Signed   By: Marnee Spring M.D.   On: 11/03/2019 04:10     Labs:   Basic Metabolic Panel: Recent Labs  Lab 11/03/19 0406 11/03/19 0406 11/03/19 0415 11/03/19 0415 11/04/19 0321 11/05/19 0426  NA 134*  --  135  --  130* 135  K 3.5   < > 3.5   < > 3.8 4.0  CL 99  --  99  --  101 105  CO2 22  --   --   --  21* 21*  GLUCOSE 143*  --  139*  --  102* 102*  BUN 15  --  16  --  15 15  CREATININE 0.98  --  0.90  --  0.93 1.05  CALCIUM 9.1  --   --   --  8.3* 8.5*  MG  --   --   --   --   --  2.0   < > = values in this interval not displayed.   GFR Estimated Creatinine Clearance: 48.9 mL/min (by C-G formula based on SCr of 1.05 mg/dL). Liver Function Tests: Recent Labs  Lab 11/03/19 0406  AST 37  ALT 41  ALKPHOS 72  BILITOT 1.3*  PROT 6.2*  ALBUMIN 3.8   No results for input(s): LIPASE, AMYLASE in the last 168 hours. No results for input(s): AMMONIA in the last 168 hours. Coagulation profile Recent Labs  Lab 11/03/19 0406  INR 1.1    CBC: Recent Labs  Lab 11/03/19 0406 11/03/19 0415 11/04/19 0321 11/05/19 0426  WBC 18.5*  --  6.0 7.0  HGB 12.6* 13.3 10.1* 9.0*  HCT 36.7* 39.0 28.7* 26.1*  MCV 95.1  --  91.4 93.2  PLT 260  --  216 192   Cardiac Enzymes: Recent Labs  Lab 11/03/19 0406 11/04/19 0321 11/05/19 0426  CKTOTAL 416* 1,148* 1,089*   BNP: Invalid input(s): POCBNP CBG: Recent Labs  Lab 11/03/19 0357 11/03/19 0712 11/04/19 2116  GLUCAP 137* 130* 111*   D-Dimer No results for input(s): DDIMER in the last 72 hours. Hgb A1c No results for input(s): HGBA1C in the last 72 hours. Lipid Profile No results for input(s): CHOL, HDL, LDLCALC, TRIG, CHOLHDL, LDLDIRECT in the last 72 hours. Thyroid function studies Recent Labs    11/03/19 1040  TSH 0.550   Anemia work up No results for input(s): VITAMINB12, FOLATE, FERRITIN, TIBC, IRON, RETICCTPCT in  the last 72  hours. Microbiology Recent Results (from the past 240 hour(s))  SARS CORONAVIRUS 2 (TAT 6-24 HRS) Nasopharyngeal Nasopharyngeal Swab     Status: None   Collection Time: 11/03/19  8:57 AM   Specimen: Nasopharyngeal Swab  Result Value Ref Range Status   SARS Coronavirus 2 NEGATIVE NEGATIVE Final    Comment: (NOTE) SARS-CoV-2 target nucleic acids are NOT DETECTED. The SARS-CoV-2 RNA is generally detectable in upper and lower respiratory specimens during the acute phase of infection. Negative results do not preclude SARS-CoV-2 infection, do not rule out co-infections with other pathogens, and should not be used as the sole basis for treatment or other [name] management decisions. Negative results must be combined with clinical observations, [name] history, and epidemiological information. The expected result is Negative. Fact Sheet for Patients: HairSlick.no Fact Sheet for Healthcare Providers: quierodirigir.com This test is not yet approved or cleared by the [place] FDA and  has been authorized for detection and/or diagnosis of SARS-CoV-2 by FDA under an Emergency Use Authorization (EUA). This EUA will remain  in effect (meaning this test can be used) for the duration of the COVID-19 declaration under Section 56 4(b)(1) of the Act, 21 U.S.C. section 360bbb-3(b)(1), unless the authorization is terminated or revoked sooner. Performed at [facility], [address], [place], [place] [address]      Discharge Instructions:   Discharge Instructions    Diet - low sodium heart healthy   Complete by: As directed    Discharge instructions   Complete by: As directed    Follow-up with your primary care physician in 1 week.  Do not take hydrochlorothiazide.  Follow-up with cardiology as scheduled by you.  Increase fluid intake for couple of days.   Increase activity slowly   Complete by: As directed      Allergies as  of 11/05/2019   No Known Allergies     Medication List    STOP taking these medications   hydrochlorothiazide 12.5 MG tablet Commonly known as: HYDRODIURIL     TAKE these medications   clopidogrel 75 MG tablet Commonly known as: PLAVIX Take 75 mg by mouth daily.   diltiazem 180 MG 24 hr capsule Commonly known as: CARDIZEM CD Take 180 mg by mouth daily.   lisinopril 10 MG tablet Commonly known as: ZESTRIL Take 10 mg by mouth daily.   polyethylene glycol 17 g packet Commonly known as: MIRALAX / GLYCOLAX Take 17 g by mouth daily.   rosuvastatin 10 MG tablet Commonly known as: CRESTOR Take 10 mg by mouth daily.   Vitamin D 50 MCG (2000 UT) tablet Take 2,000 Units by mouth daily.   zinc gluconate 50 MG tablet Take 50 mg by mouth daily.            Durable Medical Equipment  (From admission, onward)         Start     Ordered   11/05/19 1147  For home use only DME 3 n 1  Once     11/05/19 1146         Follow-up Information    [facility] Follow up.   Why: 3n1- to be delivered to the room prior to transition home.  Contact information: [address] [place] [address] [phone]        [facility] Follow up.   Specialty: Home Health Services Why: Physical Therapy, Occupational Therapy- Aide-office to call with visit times. Contact information: [address]  [address] [place] [address] [phone]            Time coordinating discharge: 39 minutes  Signed:  Ginny Loomer  [facility] 11/05/2019, 1:14 PM

## 2019-11-05 NOTE — Evaluation (Signed)
Occupational Therapy Evaluation Patient Details Name: [name] MRN: [id number] DOB: 1936-02-28 Today's Date: 11/05/2019    History of Present Illness 84 y/o male , lives home alone (niece next door), presented s/p fall at home (3rd this year). PMHx includes: arthritis, R bundle branch block and bradycardia, HTN, PVD, CVA w/ R side weakness. In ED had contusion of B shoulders and R hip, signs of aortic and corinary artherosclerotic disease. Pt admitted with elevated troponin.   Clinical Impression   PTA patient modified independent with ADLs, mobility using cane in home (and scooter in community).  Patient admitted for above and limited by problem list below, including R hemiparesis (chronic) and L UE pain, impaired balance, generalized weakness, decreased activity tolerance and decreased problem solving/safety awareness.  Patient currently requires mod assist for bed mobility, min assist for transfers, mod assist for LB ADLS and min assist for UB ADLs.  Patient donned B shoes, and requires cueing to problem solve through donning AFO straps.  Patients niece present throughout session and provides home setup/PLOF, she reports plans to hopefully get more support for him at home.  Based on performance today, believe patient will best benefit from intensive CIR level rehab in order to return to modified independent level with ADLs and mobility in order to dc home with family support intermittently. Niece declining SNF, and is aware of current recommendations for assist for transfers, mobility and ADLs at this time. Will follow.     Follow Up Recommendations  CIR;Supervision - Intermittent    Equipment Recommendations  3 in 1 bedside commode    Recommendations for Other Services Rehab consult     Precautions / Restrictions Precautions Precautions: Fall Precaution Comments: HOH Required Braces or Orthoses: Other Brace Other Brace: AFO RLE Restrictions Weight Bearing Restrictions: No       Mobility Bed Mobility Overal bed mobility: Needs Assistance Bed Mobility: Supine to Sit     Supine to sit: Mod assist     General bed mobility comments: requires mod assist to transition to sitting on EOB on R side for trunk support, HOB elevated; pt typically exits bed on R side at home as well  Transfers Overall transfer level: Needs assistance Equipment used: Straight cane Transfers: Sit to/from [employer] Transfers Sit to Stand: Min assist Stand pivot transfers: Min assist       General transfer comment: min assist to power up and steady from EOB using cane     Balance Overall balance assessment: History of Falls;Needs assistance Sitting-balance support: Feet supported Sitting balance-Leahy Scale: Good     Standing balance support: During functional activity;Single extremity supported Standing balance-Leahy Scale: Poor Standing balance comment: relaint on UE and external support                            ADL either performed or assessed with clinical judgement   ADL Overall ADL's : Needs assistance/impaired     Grooming: Minimal assistance;Sitting   Upper Body Bathing: Minimal assistance;Sitting   Lower Body Bathing: Moderate assistance;Sit to/from stand   Upper Body Dressing : Minimal assistance;Sitting   Lower Body Dressing: Moderate assistance;Sit to/from stand Lower Body Dressing Details (indicate cue type and reason): requires assist to don socks and shoes, cueing to problem solve AFO straps, min assist sit to stand  Toilet Transfer: Minimal assistance;Stand-pivot(cane)           Functional mobility during ADLs: Minimal assistance;Cane General ADL Comments: pt limited  by L UE pain with movement during ADLs, impaired balance, R UE hemiparesis, and decreased activity tolerance      Vision   Vision Assessment?: No apparent visual deficits     Perception     Praxis      Pertinent Vitals/Pain Pain Assessment: Faces Faces  Pain Scale: Hurts little more Pain Location: L UE (mid humerus) from fall, bruised and edematous  Pain Descriptors / Indicators: Guarding;Discomfort Pain Intervention(s): Monitored during session;Repositioned;Limited activity within patient's tolerance     Hand Dominance Left(noted RUE flexion synergy contractures )   Extremity/Trunk Assessment Upper Extremity Assessment Upper Extremity Assessment: RUE deficits/detail;LUE deficits/detail RUE Deficits / Details: 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, wrist cock up splint to wrist, able to wiggle fingers slightly  RUE Coordination: decreased fine motor;decreased gross motor LUE Deficits / Details: pain with elbow extension and decreased FF to 90*, due to fall and bruising but otherwise WFL  LUE Coordination: decreased gross motor   Lower Extremity Assessment Lower Extremity Assessment: Defer to PT evaluation   Cervical / Trunk Assessment Cervical / Trunk Assessment: Normal   Communication Communication Communication: HOH   Cognition Arousal/Alertness: Awake/alert Behavior During Therapy: WFL for tasks assessed/performed Overall Cognitive Status: Difficult to assess Area of Impairment: Problem solving;Awareness;Safety/judgement;Following commands                       Following Commands: Follows one step commands consistently;Follows one step commands with increased time Safety/Judgement: Decreased awareness of deficits;Decreased awareness of safety Awareness: Emergent Problem Solving: Slow processing;Difficulty sequencing;Decreased initiation;Requires tactile cues;Requires verbal cues General Comments: difficult to assess due to [facility], but appears near baseline; some decreased safety awareness noted with cueing required for problem solving (ie cueing to don AFO correctly, where pt typically does this independently)   General Comments  pts niece, [name], present and supportive throughout session;  discussed rehab options and recommendations for DC (needing assist at least for transfers and ADLs)     Exercises     Shoulder Instructions      Home Living Family/patient expects to be discharged to:: Private residence Living Arrangements: Alone(niece lives next door ) Available Help at Discharge: Family Type of Home: House Home Access: Ramped entrance     Home Layout: One level     Bathroom Shower/Tub: Producer, television/film/video: Standard     Home Equipment: Gilmer Mor - single point;Shower seat;Grab bars - toilet;Grab bars - tub/shower   Additional Comments: Niece [name] provides home setup       Prior Functioning/Environment Level of Independence: Needs assistance  Gait / Transfers Assistance Needed: patient modified independent using cane for mobility  ADL's / Homemaking Assistance Needed: pt independent with ADLs, bathing in shower and dressing independently Communication / Swallowing Assistance Needed: HOH  Comments: niece robin provides PLOF as pt HOH        OT Problem List: Decreased strength;Decreased activity tolerance;Impaired balance (sitting and/or standing);Decreased coordination;Decreased safety awareness;Decreased knowledge of use of DME or AE;Decreased knowledge of precautions;Pain;Impaired tone;Impaired UE functional use      OT Treatment/Interventions: Self-care/ADL training;Therapeutic exercise;DME and/or AE instruction;Therapeutic activities;Patient/family education;Balance training;Cognitive remediation/compensation    OT Goals(Current goals can be found in the care plan section) Acute Rehab OT Goals Patient Stated Goal: to get home  OT Goal Formulation: With patient/family Time For Goal Achievement: 11/19/19 Potential to Achieve Goals: Good  OT Frequency: Min 2X/week   Barriers to D/C: Decreased caregiver support  Co-evaluation              AM-PAC OT "6 Clicks" Daily Activity     Outcome Measure Help from another person  eating meals?: A Little Help from another person taking care of personal grooming?: A Little Help from another person toileting, which includes using toliet, bedpan, or urinal?: A Lot Help from another person bathing (including washing, rinsing, drying)?: A Lot Help from another person to put on and taking off regular upper body clothing?: A Little Help from another person to put on and taking off regular lower body clothing?: A Lot 6 Click Score: 15   End of Session Equipment Utilized During Treatment: Other (comment)(cane) Nurse Communication: Mobility status  Activity Tolerance: Patient tolerated treatment well Patient left: in chair;with call bell/phone within reach;with chair alarm set  OT Visit Diagnosis: Other abnormalities of gait and mobility (R26.89);Muscle weakness (generalized) (M62.81);History of falling (Z91.81);Pain;Hemiplegia and hemiparesis Hemiplegia - Right/Left: Right(old) Hemiplegia - caused by: Cerebral infarction Pain - Right/Left: Left Pain - part of body: Arm                Time: 7494-4967 OT Time Calculation (min): 37 min Charges:  OT General Charges $OT Visit: 1 Visit OT Evaluation $OT Eval Moderate Complexity: 1 Mod OT Treatments $Self Care/Home Management : 8-22 mins  Jolaine Artist, OT Acute Rehabilitation Services Pager [phone] Office [phone]    Delight Stare 11/05/2019, 9:52 AM

## 2019-11-05 NOTE — Plan of Care (Signed)
Mod assist with adls/toileting

## 2019-11-06 ENCOUNTER — Encounter: Payer: Self-pay | Admitting: Interventional Cardiology

## 2019-11-06 LAB — BPAM RBC
Blood Product Expiration Date: 202104282359
Blood Product Expiration Date: 202105022359
Unit Type and Rh: 1700
Unit Type and Rh: 1700

## 2019-11-06 LAB — TYPE AND SCREEN
ABO/RH(D): B NEG
Antibody Screen: POSITIVE
Unit division: 0
Unit division: 0

## 2019-11-13 ENCOUNTER — Telehealth: Payer: Self-pay

## 2019-11-13 ENCOUNTER — Ambulatory Visit (INDEPENDENT_AMBULATORY_CARE_PROVIDER_SITE_OTHER): Payer: Medicare PPO

## 2019-11-13 ENCOUNTER — Other Ambulatory Visit: Payer: Self-pay

## 2019-11-13 DIAGNOSIS — I639 Cerebral infarction, unspecified: Secondary | ICD-10-CM | POA: Diagnosis not present

## 2019-11-13 DIAGNOSIS — I10 Essential (primary) hypertension: Secondary | ICD-10-CM

## 2019-11-13 DIAGNOSIS — R7303 Prediabetes: Secondary | ICD-10-CM

## 2019-11-13 DIAGNOSIS — I214 Non-ST elevation (NSTEMI) myocardial infarction: Secondary | ICD-10-CM | POA: Diagnosis not present

## 2019-11-14 DIAGNOSIS — I69351 Hemiplegia and hemiparesis following cerebral infarction affecting right dominant side: Secondary | ICD-10-CM

## 2019-11-14 DIAGNOSIS — S51811D Laceration without foreign body of right forearm, subsequent encounter: Secondary | ICD-10-CM

## 2019-11-14 DIAGNOSIS — S40011D Contusion of right shoulder, subsequent encounter: Secondary | ICD-10-CM

## 2019-11-14 DIAGNOSIS — I10 Essential (primary) hypertension: Secondary | ICD-10-CM

## 2019-11-14 DIAGNOSIS — E785 Hyperlipidemia, unspecified: Secondary | ICD-10-CM

## 2019-11-14 DIAGNOSIS — Z7902 Long term (current) use of antithrombotics/antiplatelets: Secondary | ICD-10-CM

## 2019-11-14 DIAGNOSIS — I739 Peripheral vascular disease, unspecified: Secondary | ICD-10-CM

## 2019-11-14 DIAGNOSIS — S40012D Contusion of left shoulder, subsequent encounter: Secondary | ICD-10-CM

## 2019-11-14 DIAGNOSIS — I4510 Unspecified right bundle-branch block: Secondary | ICD-10-CM

## 2019-11-14 DIAGNOSIS — R7303 Prediabetes: Secondary | ICD-10-CM

## 2019-11-14 DIAGNOSIS — Z7982 Long term (current) use of aspirin: Secondary | ICD-10-CM

## 2019-11-14 DIAGNOSIS — Z9181 History of falling: Secondary | ICD-10-CM

## 2019-11-16 ENCOUNTER — Telehealth: Payer: Self-pay

## 2019-11-16 NOTE — Telephone Encounter (Signed)
[name] with [facility] called requesting veral orders for 1 time a week for 1 week, 2 times a week for 6 weeks and 1 time a week for 2 weeks to check on blood pressure and wound on arm to make sure it is healthing correctly. [phone]   I gave verbal orders ok and also called pt niece to schedule him an appointment she stated he is scheduled to come in on 11/17/19. YL,RMA

## 2019-11-17 ENCOUNTER — Emergency Department (HOSPITAL_COMMUNITY): Payer: Medicare PPO

## 2019-11-17 ENCOUNTER — Encounter (HOSPITAL_COMMUNITY): Payer: Self-pay

## 2019-11-17 ENCOUNTER — Ambulatory Visit: Payer: Self-pay

## 2019-11-17 ENCOUNTER — Inpatient Hospital Stay (HOSPITAL_COMMUNITY)
Admission: EM | Admit: 2019-11-17 | Discharge: 2019-12-06 | DRG: 234 | Disposition: A | Discharge status: Skilled Nursing Facility | Payer: Medicare PPO | Attending: Cardiothoracic Surgery | Admitting: Cardiothoracic Surgery

## 2019-11-17 ENCOUNTER — Other Ambulatory Visit: Payer: Self-pay

## 2019-11-17 ENCOUNTER — Ambulatory Visit: Payer: Self-pay | Admitting: Nurse Practitioner

## 2019-11-17 DIAGNOSIS — R001 Bradycardia, unspecified: Secondary | ICD-10-CM | POA: Diagnosis not present

## 2019-11-17 DIAGNOSIS — I639 Cerebral infarction, unspecified: Secondary | ICD-10-CM | POA: Diagnosis not present

## 2019-11-17 DIAGNOSIS — Z79899 Other long term (current) drug therapy: Secondary | ICD-10-CM | POA: Diagnosis not present

## 2019-11-17 DIAGNOSIS — Z823 Family history of stroke: Secondary | ICD-10-CM

## 2019-11-17 DIAGNOSIS — I513 Intracardiac thrombosis, not elsewhere classified: Secondary | ICD-10-CM | POA: Diagnosis present

## 2019-11-17 DIAGNOSIS — Z01810 Encounter for preprocedural cardiovascular examination: Secondary | ICD-10-CM | POA: Diagnosis not present

## 2019-11-17 DIAGNOSIS — K219 Gastro-esophageal reflux disease without esophagitis: Secondary | ICD-10-CM | POA: Diagnosis present

## 2019-11-17 DIAGNOSIS — I255 Ischemic cardiomyopathy: Secondary | ICD-10-CM | POA: Diagnosis present

## 2019-11-17 DIAGNOSIS — Z7902 Long term (current) use of antithrombotics/antiplatelets: Secondary | ICD-10-CM | POA: Diagnosis not present

## 2019-11-17 DIAGNOSIS — E785 Hyperlipidemia, unspecified: Secondary | ICD-10-CM | POA: Diagnosis present

## 2019-11-17 DIAGNOSIS — E7800 Pure hypercholesterolemia, unspecified: Secondary | ICD-10-CM | POA: Diagnosis present

## 2019-11-17 DIAGNOSIS — I480 Paroxysmal atrial fibrillation: Secondary | ICD-10-CM | POA: Diagnosis not present

## 2019-11-17 DIAGNOSIS — E876 Hypokalemia: Secondary | ICD-10-CM | POA: Diagnosis not present

## 2019-11-17 DIAGNOSIS — E782 Mixed hyperlipidemia: Secondary | ICD-10-CM | POA: Diagnosis not present

## 2019-11-17 DIAGNOSIS — I97411 Intraoperative hemorrhage and hematoma of a circulatory system organ or structure complicating a cardiac bypass: Secondary | ICD-10-CM | POA: Diagnosis not present

## 2019-11-17 DIAGNOSIS — I739 Peripheral vascular disease, unspecified: Secondary | ICD-10-CM

## 2019-11-17 DIAGNOSIS — Z9889 Other specified postprocedural states: Secondary | ICD-10-CM

## 2019-11-17 DIAGNOSIS — H9190 Unspecified hearing loss, unspecified ear: Secondary | ICD-10-CM | POA: Diagnosis present

## 2019-11-17 DIAGNOSIS — I10 Essential (primary) hypertension: Secondary | ICD-10-CM | POA: Diagnosis not present

## 2019-11-17 DIAGNOSIS — Z951 Presence of aortocoronary bypass graft: Secondary | ICD-10-CM

## 2019-11-17 DIAGNOSIS — Z7982 Long term (current) use of aspirin: Secondary | ICD-10-CM | POA: Diagnosis not present

## 2019-11-17 DIAGNOSIS — I745 Embolism and thrombosis of iliac artery: Secondary | ICD-10-CM | POA: Diagnosis present

## 2019-11-17 DIAGNOSIS — D62 Acute posthemorrhagic anemia: Secondary | ICD-10-CM | POA: Diagnosis not present

## 2019-11-17 DIAGNOSIS — I69351 Hemiplegia and hemiparesis following cerebral infarction affecting right dominant side: Secondary | ICD-10-CM

## 2019-11-17 DIAGNOSIS — I501 Left ventricular failure: Secondary | ICD-10-CM | POA: Diagnosis not present

## 2019-11-17 DIAGNOSIS — L8990 Pressure ulcer of unspecified site, unspecified stage: Secondary | ICD-10-CM | POA: Insufficient documentation

## 2019-11-17 DIAGNOSIS — I214 Non-ST elevation (NSTEMI) myocardial infarction: Principal | ICD-10-CM | POA: Diagnosis present

## 2019-11-17 DIAGNOSIS — Z87891 Personal history of nicotine dependence: Secondary | ICD-10-CM | POA: Diagnosis not present

## 2019-11-17 DIAGNOSIS — I361 Nonrheumatic tricuspid (valve) insufficiency: Secondary | ICD-10-CM | POA: Diagnosis not present

## 2019-11-17 DIAGNOSIS — E43 Unspecified severe protein-calorie malnutrition: Secondary | ICD-10-CM | POA: Insufficient documentation

## 2019-11-17 DIAGNOSIS — I2510 Atherosclerotic heart disease of native coronary artery without angina pectoris: Secondary | ICD-10-CM | POA: Diagnosis present

## 2019-11-17 DIAGNOSIS — I4891 Unspecified atrial fibrillation: Secondary | ICD-10-CM | POA: Diagnosis not present

## 2019-11-17 DIAGNOSIS — Z20822 Contact with and (suspected) exposure to covid-19: Secondary | ICD-10-CM | POA: Diagnosis present

## 2019-11-17 DIAGNOSIS — I340 Nonrheumatic mitral (valve) insufficiency: Secondary | ICD-10-CM | POA: Diagnosis not present

## 2019-11-17 DIAGNOSIS — R7303 Prediabetes: Secondary | ICD-10-CM | POA: Diagnosis present

## 2019-11-17 LAB — CBC
HCT: 31 % — ABNORMAL LOW (ref 39.0–52.0)
HCT: 28.9 % — ABNORMAL LOW (ref 39.0–52.0)
Hemoglobin: 10.3 g/dL — ABNORMAL LOW (ref 13.0–17.0)
Hemoglobin: 9.6 g/dL — ABNORMAL LOW (ref 13.0–17.0)
MCH: 32.5 pg (ref 26.0–34.0)
MCH: 32.2 pg (ref 26.0–34.0)
MCHC: 33.2 g/dL (ref 30.0–36.0)
MCHC: 33.2 g/dL (ref 30.0–36.0)
MCV: 97.8 fL (ref 80.0–100.0)
MCV: 97 fL (ref 80.0–100.0)
Platelets: 399 10*3/uL (ref 150–400)
Platelets: 409 10*3/uL — ABNORMAL HIGH (ref 150–400)
RBC: 3.17 MIL/uL — ABNORMAL LOW (ref 4.22–5.81)
RBC: 2.98 MIL/uL — ABNORMAL LOW (ref 4.22–5.81)
RDW: 19.6 % — ABNORMAL HIGH (ref 11.5–15.5)
RDW: 19.6 % — ABNORMAL HIGH (ref 11.5–15.5)
WBC: 9.3 10*3/uL (ref 4.0–10.5)
WBC: 8.6 10*3/uL (ref 4.0–10.5)
nRBC: 0.5 % — ABNORMAL HIGH (ref 0.0–0.2)
nRBC: 0.5 % — ABNORMAL HIGH (ref 0.0–0.2)

## 2019-11-17 LAB — TSH: TSH: 2.571 u[IU]/mL (ref 0.350–4.500)

## 2019-11-17 LAB — BASIC METABOLIC PANEL
Anion gap: 9 (ref 5–15)
BUN: 11 mg/dL (ref 8–23)
CO2: 22 mmol/L (ref 22–32)
Calcium: 8.4 mg/dL — ABNORMAL LOW (ref 8.9–10.3)
Chloride: 102 mmol/L (ref 98–111)
Creatinine, Ser: 1.05 mg/dL (ref 0.61–1.24)
GFR calc Af Amer: >60 mL/min (ref >60)
GFR calc non Af Amer: >60 mL/min (ref >60)
Glucose, Bld: 109 mg/dL — ABNORMAL HIGH (ref 70–99)
Potassium: 4.1 mmol/L (ref 3.5–5.1)
Sodium: 133 mmol/L — ABNORMAL LOW (ref 135–145)

## 2019-11-17 LAB — TROPONIN I (HIGH SENSITIVITY)
Troponin I (High Sensitivity): 1867 ng/L (ref <18)
Troponin I (High Sensitivity): 1824 ng/L (ref <18)
Troponin I (High Sensitivity): 1630 ng/L (ref <18)
Troponin I (High Sensitivity): 1794 ng/L (ref <18)

## 2019-11-17 LAB — RESPIRATORY PANEL BY RT PCR (FLU A&B, COVID)
Influenza A by PCR: NEGATIVE
Influenza B by PCR: NEGATIVE
SARS Coronavirus 2 by RT PCR: NEGATIVE

## 2019-11-17 LAB — BRAIN NATRIURETIC PEPTIDE: B Natriuretic Peptide: 2124.2 pg/mL — ABNORMAL HIGH (ref 0.0–100.0)

## 2019-11-17 LAB — MAGNESIUM: Magnesium: 2.2 mg/dL (ref 1.7–2.4)

## 2019-11-17 LAB — MRSA PCR SCREENING: MRSA by PCR: NEGATIVE

## 2019-11-17 MED ORDER — ASPIRIN EC 81 MG PO TBEC
81.0000 mg | DELAYED_RELEASE_TABLET | Freq: Every day | ORAL | Status: DC
  Administered 2019-11-18 – 2019-11-20 (×3): 81 mg via ORAL
  Filled 2019-11-17 (×3): qty 1

## 2019-11-17 MED ORDER — SODIUM CHLORIDE 0.9% FLUSH
3.0000 mL | INTRAVENOUS | Status: DC | PRN
  Administered 2019-11-23: 3 mL via INTRAVENOUS

## 2019-11-17 MED ORDER — HEPARIN BOLUS VIA INFUSION
4000.0000 [IU] | Freq: Once | INTRAVENOUS | Status: AC
  Administered 2019-11-17: 4000 [IU] via INTRAVENOUS
  Filled 2019-11-17: qty 4000

## 2019-11-17 MED ORDER — ZOLPIDEM TARTRATE 5 MG PO TABS: 5.0000 mg | ORAL_TABLET | Freq: Every evening | ORAL | Status: DC | PRN

## 2019-11-17 MED ORDER — ALBUTEROL SULFATE (2.5 MG/3ML) 0.083% IN NEBU
2.5000 mg | INHALATION_SOLUTION | Freq: Once | RESPIRATORY_TRACT | Status: AC
  Administered 2019-11-17: 2.5 mg via RESPIRATORY_TRACT
  Filled 2019-11-17: qty 3

## 2019-11-17 MED ORDER — SODIUM CHLORIDE 0.9% FLUSH: 3.0000 mL | Freq: Once | INTRAVENOUS | Status: DC

## 2019-11-17 MED ORDER — SODIUM CHLORIDE 0.9 % IV SOLN: 250.0000 mL | INTRAVENOUS | Status: DC | PRN

## 2019-11-17 MED ORDER — ACETAMINOPHEN 325 MG PO TABS
650.0000 mg | ORAL_TABLET | ORAL | Status: DC | PRN
  Administered 2019-11-18 – 2019-11-19 (×2): 650 mg via ORAL
  Filled 2019-11-17 (×3): qty 2

## 2019-11-17 MED ORDER — SODIUM CHLORIDE 0.9 % IV BOLUS
1000.0000 mL | Freq: Once | INTRAVENOUS | Status: AC
  Administered 2019-11-17: 1000 mL via INTRAVENOUS

## 2019-11-17 MED ORDER — SODIUM CHLORIDE 0.9% FLUSH
3.0000 mL | Freq: Two times a day (BID) | INTRAVENOUS | Status: DC
  Administered 2019-11-19 – 2019-11-23 (×3): 3 mL via INTRAVENOUS

## 2019-11-17 MED ORDER — SODIUM CHLORIDE 0.9 % WEIGHT BASED INFUSION: 1.0000 mL/kg/h | INTRAVENOUS | Status: DC

## 2019-11-17 MED ORDER — FUROSEMIDE 10 MG/ML IJ SOLN
40.0000 mg | Freq: Once | INTRAMUSCULAR | Status: AC
  Administered 2019-11-17: 40 mg via INTRAVENOUS
  Filled 2019-11-17: qty 4

## 2019-11-17 MED ORDER — HEPARIN (PORCINE) 25000 UT/250ML-% IV SOLN
800.0000 [IU]/h | INTRAVENOUS | Status: DC
  Administered 2019-11-17: 800 [IU]/h via INTRAVENOUS
  Filled 2019-11-17: qty 250

## 2019-11-17 MED ORDER — ALUM & MAG HYDROXIDE-SIMETH 200-200-20 MG/5ML PO SUSP
30.0000 mL | ORAL | Status: DC | PRN
  Administered 2019-11-17: 30 mL via ORAL
  Filled 2019-11-17: qty 30

## 2019-11-17 MED ORDER — ROSUVASTATIN CALCIUM 5 MG PO TABS
10.0000 mg | ORAL_TABLET | Freq: Every day | ORAL | Status: DC
  Administered 2019-11-19 – 2019-11-23 (×3): 10 mg via ORAL
  Filled 2019-11-17 (×3): qty 2

## 2019-11-17 MED ORDER — SODIUM CHLORIDE 0.9 % IV SOLN
250.0000 mL | INTRAVENOUS | Status: DC | PRN
  Administered 2019-11-17: 250 mL via INTRAVENOUS

## 2019-11-17 MED ORDER — NITROGLYCERIN IN D5W 200-5 MCG/ML-% IV SOLN
0.0000 ug/min | INTRAVENOUS | Status: DC
  Administered 2019-11-17: 5 ug/min via INTRAVENOUS
  Filled 2019-11-17 (×2): qty 250

## 2019-11-17 MED ORDER — SODIUM CHLORIDE 0.9% FLUSH
3.0000 mL | Freq: Two times a day (BID) | INTRAVENOUS | Status: DC
  Administered 2019-11-19 – 2019-11-28 (×6): 3 mL via INTRAVENOUS

## 2019-11-17 MED ORDER — SODIUM CHLORIDE 0.9% FLUSH: 3.0000 mL | INTRAVENOUS | Status: DC | PRN

## 2019-11-17 MED ORDER — ASPIRIN 81 MG PO CHEW: 81.0000 mg | CHEWABLE_TABLET | ORAL | Status: AC

## 2019-11-17 MED ORDER — ALPRAZOLAM 0.25 MG PO TABS: 0.2500 mg | ORAL_TABLET | Freq: Two times a day (BID) | ORAL | Status: DC | PRN

## 2019-11-17 MED ORDER — SODIUM CHLORIDE 0.9 % WEIGHT BASED INFUSION
3.0000 mL/kg/h | INTRAVENOUS | Status: DC
  Administered 2019-11-18: 3 mL/kg/h via INTRAVENOUS

## 2019-11-17 MED ORDER — LEVALBUTEROL HCL 0.63 MG/3ML IN NEBU
0.6300 mg | INHALATION_SOLUTION | Freq: Once | RESPIRATORY_TRACT | Status: DC
  Filled 2019-11-17: qty 3

## 2019-11-17 MED ORDER — NITROGLYCERIN 0.4 MG SL SUBL
0.4000 mg | SUBLINGUAL_TABLET | Freq: Once | SUBLINGUAL | Status: AC
  Administered 2019-11-17: 0.4 mg via SUBLINGUAL
  Filled 2019-11-17: qty 1

## 2019-11-17 MED ORDER — ONDANSETRON HCL 4 MG/2ML IJ SOLN
4.0000 mg | Freq: Once | INTRAMUSCULAR | Status: AC
  Administered 2019-11-17: 4 mg via INTRAVENOUS
  Filled 2019-11-17: qty 2

## 2019-11-17 MED ORDER — ONDANSETRON HCL 4 MG/2ML IJ SOLN
4.0000 mg | Freq: Four times a day (QID) | INTRAMUSCULAR | Status: DC | PRN
  Administered 2019-11-17: 4 mg via INTRAVENOUS
  Filled 2019-11-17: qty 2

## 2019-11-17 MED ORDER — METOPROLOL TARTRATE 12.5 MG HALF TABLET
12.5000 mg | ORAL_TABLET | Freq: Two times a day (BID) | ORAL | Status: DC
  Administered 2019-11-18 – 2019-11-20 (×2): 12.5 mg via ORAL
  Filled 2019-11-17 (×4): qty 1

## 2019-11-17 MED ORDER — CLOPIDOGREL BISULFATE 75 MG PO TABS
75.0000 mg | ORAL_TABLET | Freq: Every day | ORAL | Status: DC
  Administered 2019-11-18: 75 mg via ORAL
  Filled 2019-11-17: qty 1

## 2019-11-17 MED ORDER — ASPIRIN EC 81 MG PO TBEC: 81.0000 mg | DELAYED_RELEASE_TABLET | Freq: Every day | ORAL | Status: DC

## 2019-11-17 MED ORDER — NITROGLYCERIN 0.4 MG SL SUBL: 0.4000 mg | SUBLINGUAL_TABLET | SUBLINGUAL | Status: DC | PRN

## 2019-11-17 MED ORDER — MAGNESIUM HYDROXIDE 400 MG/5ML PO SUSP
30.0000 mL | Freq: Every day | ORAL | Status: DC | PRN
  Administered 2019-11-19 – 2019-11-26 (×2): 30 mL via ORAL
  Filled 2019-11-17 (×2): qty 30

## 2019-11-17 NOTE — H&P (Addendum)
Cardiology Admission History and Physical:   Patient ID: [name] MRN: [id number]; DOB: February 21, 1936   Admission date: 11/17/2019  Primary Care Provider: Patient, No Pcp Per Primary Cardiologist: William Noe, MD  Primary Electrophysiologist:  None   Chief Complaint:  Chest pain  Patient Profile:   [name] is a 84 y.o. male with pmh of HTN, HLD, CVA in 2001 in right sided weakness, prediabetes, B/L carotid artery disease (1-39%), PAD, RBBB, sinus bradycardia who is being seen for chest pain.   History of Present Illness:   [name] recently established care with Dr. Katrinka Blazing March 2020. He was referred for RBBB and sinus bradycardia. At the visit HR were in the 60s and patient was asymptomatic. Dr. Katrinka Blazing reviewed duplex abd aortic aneurysm study from November 2020 which showed no evidence of aneurysm. The patient follows with Dr. Myra Gianotti for his PAD. ABIs and duplex showed significant findings including right iliac occlusion and left superficial femoral and external iliac artery stenosis for which he takes aspirin and plavix. He was seen again by [place] 11/03/19 in consult for an admission for a mechanical fall. In the ER he was level 2 trauma and was pan scanned which showed no acute fractures. Orthostatics normal. Echo showed EF 60-65% without RWMA. Troponin was elevated to 420 in the setting of elevated CK suspected demand ischemia. Heart rates remained in the 50s.He was discharged 11/05/18 home with home health.   The patient presented to the ED 11/17/19 for chest pain. He says pain started 3 days ago while at rest. It is in the middle of the chest and feels like a tightness. It is non radiating and 10/10. It has been waxing and waning. It is worse when sitting up and better when he lays down. It is not worse on exertion. He has associated sob, nausea, and vomiting. He has chronic GERD and thought it was at first acid-reflux so he did not think anything of it. He also has some chest  wall tenderness. Denies recent illness, fever, chills.   In the ED BP 113/64, pulse 78, afebrile, RR 15, 99% O2. Labs showed sodium 133, glucose 109, calcium 8.4, creatinine 1.05, Hgb 10.3. Hs troponin 1,867. BNP 2,124. CXR showed mild to moderate pulmonary edema. EKG with NSR and minimal ST depression in lateral leads. Patient takes aspirin and plavix daily or PAD. He received NTG in the ED without improvement. Started on IV heparin.   Patient is accompanied by his niece who helps take care of him and lives close by. He is a functional 84 yo. On exam he is nauseous and vomiting clear fluid. He says he still has severe chest pain and breathing appears difficult. Will give zofran, Xopenex, and IV lasix.    Past Medical History:  Diagnosis Date   Arthritis    Bundle branch block    Hypercholesteremia    Hyperlipidemia 2001   Hypertension 2001   Hypertension    Peripheral vascular disease (HCC)    Stroke (HCC)    Stroke (HCC)    2001 - R side deficits    No past surgical history on file.   Medications Prior to Admission: Prior to Admission medications   Medication Sig Start Date End Date Taking? Authorizing Provider  aspirin EC 81 MG tablet Take 81 mg by mouth daily.    [provider]  cetirizine (ZYRTEC) 10 MG tablet Take 10 mg by mouth as needed for allergies.    [provider]  Cholecalciferol (VITAMIN D) 50 MCG (2000 UT) tablet Take 2,000 Units by mouth daily.    [provider]  clopidogrel (PLAVIX) 75 MG tablet TAKE 1 TABLET BY MOUTH DAILY 08/03/19   [name], FNP  clopidogrel (PLAVIX) 75 MG tablet Take 75 mg by mouth daily.    [provider]  diclofenac sodium (VOLTAREN) 1 % GEL Apply 2 g topically 4 (four) times daily. Rub into affected area of foot 2 to 4 times daily 01/20/19   Trula Slade, DPM  diltiazem (CARDIZEM CD) 180 MG 24 hr capsule TAKE ONE CAPSULE BY MOUTH EVERY DAY 10/31/19   [name], FNP  diltiazem (CARDIZEM CD)  180 MG 24 hr capsule Take 180 mg by mouth daily. 10/30/19   [provider]  hydrochlorothiazide (HYDRODIURIL) 12.5 MG tablet TAKE 1 TABLET BY MOUTH ONCE DAILY 08/03/19   [name], FNP  lisinopril (ZESTRIL) 10 MG tablet TAKE 1 TABLET BY MOUTH DAILY 08/03/19   [name], FNP  lisinopril (ZESTRIL) 10 MG tablet Take 10 mg by mouth daily.    [provider]  lisinopril-hydrochlorothiazide (ZESTORETIC) 10-12.5 MG tablet Take 1 tablet by mouth daily. 02/10/19   [name], FNP  polyethylene glycol (MIRALAX / GLYCOLAX) 17 g packet Take 17 g by mouth daily. 11/05/19   Pokhrel, Corrie Mckusick, MD  rosuvastatin (CRESTOR) 10 MG tablet TAKE 1 TABLET BY MOUTH EVERY DAY 06/26/19   [name], FNP  rosuvastatin (CRESTOR) 10 MG tablet Take 10 mg by mouth daily.    [provider]  VITAMIN D PO Take 1 tablet by mouth daily.    [provider]  zinc gluconate 50 MG tablet Take 50 mg by mouth daily.    [provider]  zinc gluconate 50 MG tablet Take 50 mg by mouth daily.    [provider]     Allergies:   No Known Allergies  Social History:   Social History   Socioeconomic History   Marital status: Single    Spouse name: Not on file   Number of children: Not on file   Years of education: Not on file   Highest education level: Not on file  Occupational History   Occupation: retired  Tobacco Use   Smoking status: Never Smoker   Smokeless tobacco: Never Used  Substance and Sexual Activity   Alcohol use: Never   Drug use: Never   Sexual activity: Not Currently  Other Topics Concern   Not on file  Social History Narrative   ** Merged History Encounter **       Social Determinants of Health   Financial Resource Strain:    Difficulty of Paying Living Expenses:   Food Insecurity:    Worried About Charity fundraiser in the Last Year:    Arboriculturist in the Last Year:   Transportation Needs:    Film/video editor (Medical):    Lack  of Transportation (Non-Medical):   Physical Activity: Inactive   Days of Exercise per Week: 0 days   Minutes of Exercise per Session: 0 min  Stress:    Feeling of Stress :   Social Connections:    Frequency of Communication with Friends and Family:    Frequency of Social Gatherings with Friends and Family:    Attends Religious Services:    Active Member of Clubs or Organizations:    Attends Archivist Meetings:    Marital Status:   Intimate Partner Violence:  Fear of Current or Ex-Partner:    Emotionally Abused:    Physically Abused:    Sexually Abused:     Family History:   The patient's family history includes Cancer in his brother; Heart attack in his father; Stroke in his father and mother.    ROS:  Please see the history of present illness.  All other ROS reviewed and negative.     Physical Exam/Data:   Vitals:   11/17/19 1310 11/17/19 1515 11/17/19 1634  BP: 113/64 108/71 95/64  Pulse: 78 75 82  Resp: 15 17 18   Temp: 97.6 F (36.4 C)    TempSrc: Oral    SpO2: 99% 96% 97%    Intake/Output Summary (Last 24 hours) at 11/17/2019 1643 Last data filed at 11/17/2019 1636 Gross per 24 hour  Intake 999 ml  Output --  Net 999 ml   Last 3 Weights 11/05/2019 11/04/2019 11/03/2019  Weight (lbs) 148 lb 5.9 oz 166 lb 10.7 oz 166 lb 7.2 oz  Weight (kg) 67.3 kg 75.6 kg 75.5 kg     There is no height or weight on file to calculate BMI.  General:  Well nourished, well developed, in no acute distress HEENT: normal Lymph: no adenopathy Neck: + JVD Endocrine:  No thryomegaly Vascular: No carotid bruits; FA pulses 2+ bilaterally without bruits  Cardiac:  normal S1, S2; RRR; no murmur  Lungs:  Diminished at bases with wheezing and crackles Abd: soft, nontender, no hepatomegaly  Ext: moderate edema Musculoskeletal:  No deformities, BUE and BLE strength normal and equal Skin: warm and dry  Neuro:  CNs 2-12 intact, no focal abnormalities noted Psych:  Normal affect     EKG:  The ECG that was done 11/17/19 was personally reviewed and demonstrates  NSR and minimal ST depression in lateral leads.   Relevant CV Studies:  Echo 11/03/19   1. Left ventricular ejection fraction, by estimation, is 60 to 65%. The  left ventricle has normal function. The left ventricle has no regional  wall motion abnormalities. Left ventricular diastolic parameters are  consistent with Grade I diastolic  dysfunction (impaired relaxation).   2. Right ventricular systolic function was not well visualized. The right  ventricular size is not well visualized.   3. The mitral valve is grossly normal. No evidence of mitral valve  regurgitation.   4. The aortic valve was not well visualized. Aortic valve regurgitation  is not visualized.   Laboratory Data:  High Sensitivity Troponin:   Recent Labs  Lab 11/03/19 0836 11/03/19 1022 11/03/19 1649 11/17/19 1324  TROPONINIHS 387* 420* 402* 1,867*      Chemistry Recent Labs  Lab 11/17/19 1324  NA 133*  K 4.1  CL 102  CO2 22  GLUCOSE 109*  BUN 11  CREATININE 1.05  CALCIUM 8.4*  GFRNONAA >60  GFRAA >60  ANIONGAP 9    No results for input(s): PROT, ALBUMIN, AST, ALT, ALKPHOS, BILITOT in the last 168 hours. Hematology Recent Labs  Lab 11/17/19 1324 11/17/19 1523  WBC 9.3 8.6  RBC 3.17* 2.98*  HGB 10.3* 9.6*  HCT 31.0* 28.9*  MCV 97.8 97.0  MCH 32.5 32.2  MCHC 33.2 33.2  RDW 19.6* 19.6*  PLT 399 409*   BNP Recent Labs  Lab 11/17/19 1444  BNP 2,124.2*    DDimer No results for input(s): DDIMER in the last 168 hours.   Radiology/Studies:  DG Chest 2 View  Result Date: 11/17/2019 CLINICAL DATA:  Chest pain. EXAM: CHEST -  2 VIEW COMPARISON:  04/23/2002 and 01/03/2020 chest radiograph. 01/03/2020 CT chest. FINDINGS: New diffuse interstitial prominence with patchy perihilar and bibasilar opacities. No pneumothorax.  Small bilateral pleural effusions. Cardiomediastinal silhouette is partially obscured. Aortic  atherosclerotic calcifications. No acute osseous abnormality. IMPRESSION: Mild to moderate pulmonary edema. Cannot exclude underlying infectious foci. Electronically Signed   By: Stana Bunting M.D.   On: 11/17/2019 13:55    TIMI Risk Score for Unstable Angina or Non-ST Elevation MI:   The patient's TIMI risk score is 5, which indicates a 26% risk of all cause mortality, new or recurrent myocardial infarction or need for urgent revascularization in the next 14 days.   Assessment and Plan:   NSTEMI Patient presents with 3 days of intermittent chest pain with associated N/V and sob, worse with positioning. NTG did not improve the pain. In the ED  EKG with some ST depression inferolateral leads. HS troponin 1,867. BNP 2,124. CXR with mild to mod pulmonary edema. creatinine 1.05. Started on IV heparin. - Admit for diuresis and cardiac cath - patient takes aspirin and plavix for PAD>>will continue - continue with IV heparin - continue to trend troponins - check TSH - start low dose IV NTG for chest pain - NPO for possible cath tomorrow. Creatinine stable Risks and benefits of cardiac catheterization have been discussed with the patient.  These include bleeding, infection, kidney damage, stroke, heart attack, death.  The patient understands these risks and is willing to proceed. - start low dose BB. Hold home antihypertensives meds -Will need to assess respiratory status prior to cath.   Acute on chronic Heart Failure - BNP 2,124, CXR with mild to mod pulmonary edema - Recent echo showed preserved EF with G1DD - give IV lasix 40 mg now and continue with BID tomorrow - will hold HCTZ and lisinopril for kidney function in the setting of lasix and cath  HTN - takes Cardizem 180 mg daily, HCTZ 12.5 mg daily, lisinopril 10 mg daily - Hold HCTZ and lisinopril as above - Will hold Cardizem for soft pressures - will likely have addition of BB  HLD - takes crestor 10 mg daily at home - LDL  75 in 02/2019 - recheck in the AM  Prediabetes - A1C 6.0 in 06/2019 - not on meds  PAD  - ABIs and duplex showed significant findings including right iliac occlusion and left superficial femoral and external iliac artery stenosis. - follows with Dr. Myra Gianotti - Aspirin and plavix  Severity of Illness: The appropriate patient status for this patient is INPATIENT. Inpatient status is judged to be reasonable and necessary in order to provide the required intensity of service to ensure the patient's safety. The patient's presenting symptoms, physical exam findings, and initial radiographic and laboratory data in the context of their chronic comorbidities is felt to place them at high risk for further clinical deterioration. Furthermore, it is not anticipated that the patient will be medically stable for discharge from the hospital within 2 midnights of admission. The following factors support the patient status of inpatient.   " The patient's presenting symptoms include chest pain. " The worrisome physical exam findings include chest pain. " The initial radiographic and laboratory data are worrisome because of elevated troponin. " The chronic co-morbidities include HTN, HLD, PAD.   * I certify that at the point of admission it is my clinical judgment that the patient will require inpatient hospital care spanning beyond 2 midnights from the point of admission due to  high intensity of service, high risk for further deterioration and high frequency of surveillance required.*    For questions or updates, please contact [facility] Please consult [email or web address] for contact info under        Signed, Cadence Ardelle Lesches  11/17/2019 4:43 PM   Agree with note by Terrilee Croak, PA-C  [name] is an 84 year old and frail appearing single African-American male accompanied by his niece [name].  He has a history of PAD with carotid and peripheral vascular disease.  He recently established  care with Dr. Garnette Scheuermann.  He has left bundle branch block and normal LV function by 2D echo performed recently.  He has no prior cardiac history.  He does have a history of hypertension, hyperlipidemia and prediabetes.  He has had chest pain for several days.  He currently has atypical chest pain and shortness of breath.  Chest x-ray remarkable for pulmonary edema.  His EKG shows new lateral ST segment depression.  Troponins are positive as this is BNP.  On exam he said bibasilar crackles, rhonchi and wheezes while has increased regular venous pressure and 2+ pitting edema.  He is on IV heparin.  I suspect that he has ischemically mediated pulmonary edema and non-STEMI.  We will admit him to unit 2H, diuresed with IV Lasix and place him on low-dose IV nitroglycerin.  We will hold his outpatient antihypertensive medications for now.  He is already on dual antiplatelet therapy.  We will plan diagnostic coronary angiography once he adequately diuresis and is clinically improved.   Runell Gess, M.D., FACP, [facility], [facility] [address] [place], [place]  [address]  [phone] 11/17/2019 6:02 PM

## 2019-11-17 NOTE — Patient Instructions (Signed)
Visit Information  Goals Addressed      Patient Stated   . "We could use some help with medication management" (pt-stated)       CARE PLAN ENTRY (see longitudinal plan of care for additional care plan information)  Current Barriers:  [name] Knowledge Deficits related to best options for medication management of Polypharmacy . Chronic Disease Management support and education needs related to HTN, CVA, prediabetes   Nurse Case Manager Clinical Goal(s):  [name] Over the next 30 days, patient will work with embedded Pharm D to address needs related to medication management and adherence   CCM RN CM Interventions:  . Inter-disciplinary care team collaboration (see longitudinal plan of care) . Reviewed medications with patient and discussed patient had 1 medication change post d/c; Determined his HCTZ was discontinued; Determined patient is self administering his medications with the help from his niece [name], she is filling individual medication cups and the patient is able to Self administer his own medications; Determined niece is concerned about using this system and would like for a Pharmacist to help with medication management with Polypharmacy needs . Discussed plans with patient for ongoing care management follow up and provided patient with direct contact information for care management team . Pharmacy referral for assistance with Medication management   Patient Self Care Activities:  . Self administers medications as prescribed (niece [name] fills medication cups) . Attends all scheduled provider appointments . Performs ADL's independently . Performs IADL's independently  Initial goal documentation       Other   . COMPLETED: "He is having pain and swelling to his right ankle"       Niece stated  Current Barriers:  [name] Knowledge Deficits related to diagnosis and treatment for right ankle pain and swelling   Nurse Case Manager Clinical Goal(s):  [name] Over the next 30 days, patient/niece  [name] will verbalize understanding of plan for diagnosis and treatment management for right ankle pain and swelling. . Over the next 30 days, patient will have no mobility issues related to pain or swelling to his right ankle. . 05/14/2019- Over the next 60 days the patient will work with RN Case Manager to develop a better understanding of recent vascular studies as evidenced by ability to verbalize basic understanding of signs and symptoms of a blood clot  CCM RN CM Interventions:  11/13/18 call completed with niece [name]  . Evaluation of current treatment plan related to right ankle pain and swelling and patient's adherence to plan as established by provider . Discussed plans with patient for ongoing care management follow up and provided patient with direct contact information for care management team  Patient Self Care Activities:  . Self administers medications as prescribed . Attends all scheduled provider appointments . Performs ADL's independently . Performs IADL's independently  Please see past updates related to this goal by clicking on the "Past Updates" button in the selected goal      . "I need help applying for Medicaid and getting an emergency alert system in place"       Niece [name] stated [place] (see longitudinal plan of care for additional care plan information)  Current Barriers:  [name] Knowledge Deficits related to Resources needed for an emergency alert system, How to apply for Medicaid and How to obtain legal services for help with tax services for Mr. Aida Puffer . Chronic Disease Management support and education needs related to HTN, CVA, prediabetes  Nurse Case Manager Clinical Goal(s):  .  Over the next 30 days, patient will work with embedded BSW Daneen Schick to address needs related to    CCM RN CM Interventions:  . Inter-disciplinary care team collaboration (see longitudinal plan of care) . Collaborated with embedded BSW Daneen Schick  regarding  patient/caregiver resources needs . Discussed plans with patient for ongoing care management follow up and provided patient with direct contact information for care management team . Social Work referral for Resource needs as mentioned above   Patient Self Care Activities:  . Self administers medications as prescribed (niece [name] fills medication cups) . Attends all scheduled provider appointments . Performs ADL's independently . Performs IADL's independently  Initial goal documentation     . COMPLETED: "It would be good for him to learn what foods to eat"       Niece stated Current Barriers:  [name] Knowledge Deficits related to Diabetes disease process and Self Health Management   Nurse Case Manager Clinical Goal(s):  [name] Over the next 90 days, patient will work with the CCM team  to address needs related to Diabetes disease education and nutritional recommendations.   02/04/19 - target goal date re-established to 90 days due to COVID-19 treatment delays Goal Met   CCM RN CM Interventions:  11/13/18 call completed with niece [name]  . Evaluation of current treatment plan related to prediabetes and patient's adherence to plan as established by provider . Determined patient is receiving home delivered meals; Determined he is now living next door to his niece [name] and several other nieces who are all available to check on him and help with warming meals etc., as needed . Determined patient is adhering to following a heart healthy diabetic friendly diet  . Discussed plans with patient for ongoing care management follow up and provided patient with direct contact information for care management team  Patient Self Care Activities:  . Attends all scheduled provider appointments . Calls pharmacy for medication refills . Calls provider office for new concerns or questions  Please see past updates related to this goal by clicking on the "Past Updates" button in the selected goal        . Post discharge follow up       [place] (see longitudinal plan of care for additional care plan information)  Current Barriers:  [name] Knowledge Deficits related to home safety and fall prevention  . Chronic Disease Management support and education needs related to HTN, CVA, prediabetes  Nurse Case Manager Clinical Goal(s):  [name] Over the next 30 days, patient will work with in home PT/OT for strengthening and balance and will adhere to his recommended HEP plan.  . Over the next 90 days, patient will work with CCM RN CM and PCP to address needs related to disease education and support to improve Self Health management of chronic conditions . Over the next 90 days, patient will experience having no falls or injury's related to falls   CCM RN CM Interventions:  11/13/19 call completed with niece [name] . Inter-disciplinary care team collaboration (see longitudinal plan of care) . Determined patient had a fall in his home; Determined he now has a new apartment with hard woods and was not wearing skid proof socks . Determined patient is now living next door to his niece [name] and several other family member's live in the same neighborhood . Determined niece plans to initiate an emergency alert system as soon as possible and will collaborate with the embedded BSW for additional  resources . Evaluation of current treatment plan related to [facility] and post MD followup  and patient's adherence to plan as established by provider . Determined patient will have in home PT and OT from [facility]; Determined SNV was not ordered upon d/c; Determined a CNA was also ordered to assist with personal hygiene while PT/OT services are active but [facility] does not have aide available  . Sent in basket message to PCP provider [name], FNP requesting orders for [facility] to assist with medication management, assess R arm abrasion and monitor BP/HR  . Collaborated with [facility], spoke with  [place] regarding [place] [facility] and CNA needs; discussed Carlyon Shadow will fax order to the office for SNV orders per PCP; Determined a CNA will be available mid next week . Updated niece [name] regarding pending orders for [facility] and alerted her to the update regarding CNA availability for mid next week . Discussed plans with patient for ongoing care management follow up and provided patient with direct contact information for care management team  Patient Self Care Activities:  . Self administers medications as prescribed (niece [name] fills medication cups) . Attends all scheduled provider appointments . Performs ADL's independently . Performs IADL's independently  Initial goal documentation       Patient verbalizes understanding of instructions provided today.   The care management team will reach out to the patient again over the next 14-21 days.   Barb Merino, RN, BSN, CCM Care Management Coordinator [facility]  Direct Phone: [phone]

## 2019-11-17 NOTE — ED Provider Notes (Signed)
[facility] EMERGENCY DEPARTMENT Provider Note   CSN: [id number] Arrival date & time: 11/17/19  1259     History Chief Complaint  Patient presents with  . Chest Pain    [name] is a 84 y.o. male w/ hx of stroke, HTN, HLD, presented to emergency department chest pain.  The patient's niece was at bedside who is also his POA reports that the patient has been complaining for 2 days about epigastric pain which he described as "gas pain".  She said it was sometimes a squeezing pain.  He had not been complaining about pain prior to this.  She called EMS who gave the patient full dose aspirin prior to arrival and 1 SL nitro (with no relief of CP) She noted that he is audibly wheezing today.  He is not on home oxygen.  The patient is hard of hearing.   Currently the emergency department the patient complains of 9 out of 10 chest pain.  He does feel mildly short of breath.  Of note the patient was most recently hospitalized 11/03/19-11/05/19  HPI  HPI: A 84 year old patient with a history of CVA, peripheral artery disease, hypertension and hypercholesterolemia presents for evaluation of chest pain. Initial onset of pain was less than one hour ago. The patient's chest pain is described as heaviness/pressure/tightness and is not worse with exertion. The patient complains of nausea. The patient's chest pain is middle- or left-sided, is not well-localized, is not sharp and does not radiate to the arms/jaw/neck. The patient denies diaphoresis. The patient has not smoked in the past 90 days, denies any history of treated diabetes, has no relevant family history of coronary artery disease (first degree relative at less than age 43) and does not have an elevated BMI (>=30).   Past Medical History:  Diagnosis Date  . Arthritis   . Bundle branch block   . Hypercholesteremia   . Hyperlipidemia 2001  . Hypertension 2001  . Hypertension   . Peripheral vascular disease (HCC)   .  Stroke (HCC)   . Stroke [facility])    2001 - R side deficits    Patient Active Problem List   Diagnosis Date Noted  . NSTEMI (non-ST elevated myocardial infarction) (HCC) 11/17/2019  . Elevated troponin 11/03/2019  . PVD (peripheral vascular disease) (HCC) 11/03/2019  . History of CVA with residual deficit 11/03/2019  . Elevated CK 11/03/2019  . Fall at home, initial encounter 11/03/2019  . Prediabetes 11/03/2019  . Essential hypertension 11/03/2019  . PAD (peripheral artery disease) (HCC) 05/11/2019    No past surgical history on file.     Family History  Problem Relation Age of Onset  . Stroke Mother   . Stroke Father   . Heart attack Father   . Cancer Brother     Social History   Tobacco Use  . Smoking status: Never Smoker  . Smokeless tobacco: Never Used  Substance Use Topics  . Alcohol use: Never  . Drug use: Never    Home Medications Prior to Admission medications   Medication Sig Start Date End Date Taking? Authorizing Provider  aspirin EC 81 MG tablet Take 81 mg by mouth daily.   Yes [provider]  cetirizine (ZYRTEC) 10 MG tablet Take 10 mg by mouth as needed for allergies.   Yes [provider]  Cholecalciferol (VITAMIN D) 50 MCG (2000 UT) tablet Take 2,000 Units by mouth daily.   Yes [provider]  clopidogrel (PLAVIX) 75 MG  tablet TAKE 1 TABLET BY MOUTH DAILY Patient taking differently: Take 75 mg by mouth daily.  08/03/19  Yes [name], FNP  diltiazem (CARDIZEM CD) 180 MG 24 hr capsule TAKE ONE CAPSULE BY MOUTH EVERY DAY Patient taking differently: Take 180 mg by mouth daily.  10/31/19  Yes [name], FNP  lisinopril (ZESTRIL) 10 MG tablet TAKE 1 TABLET BY MOUTH DAILY Patient taking differently: Take 10 mg by mouth daily.  08/03/19  Yes [name], FNP  polyethylene glycol (MIRALAX / GLYCOLAX) 17 g packet Take 17 g by mouth daily. Patient taking differently: Take 17 g by mouth daily as needed for mild constipation.   11/05/19  Yes Pokhrel, Laxman, MD  rosuvastatin (CRESTOR) 10 MG tablet TAKE 1 TABLET BY MOUTH EVERY DAY Patient taking differently: Take 10 mg by mouth daily.  06/26/19  Yes [name], FNP  VITAMIN D PO Take 1 tablet by mouth daily.   Yes [provider]  zinc gluconate 50 MG tablet Take 50 mg by mouth daily.   Yes [provider]  diclofenac sodium (VOLTAREN) 1 % GEL Apply 2 g topically 4 (four) times daily. Rub into affected area of foot 2 to 4 times daily Patient not taking: Reported on 11/17/2019 01/20/19   Trula Slade, DPM  hydrochlorothiazide (HYDRODIURIL) 12.5 MG tablet TAKE 1 TABLET BY MOUTH ONCE DAILY Patient not taking: No sig reported 08/03/19   [name], FNP  lisinopril-hydrochlorothiazide (ZESTORETIC) 10-12.5 MG tablet Take 1 tablet by mouth daily. Patient not taking: Reported on 11/17/2019 02/10/19   [name], [place]    Allergies    Patient has no known allergies.  Review of Systems   Review of Systems  Constitutional: Negative for chills and fever.  Eyes: Negative for pain and visual disturbance.  Respiratory: Negative for cough and shortness of breath.   Cardiovascular: Positive for chest pain. Negative for palpitations.  Gastrointestinal: Negative for abdominal pain and vomiting.  Genitourinary: Negative for dysuria and hematuria.  Musculoskeletal: Negative for arthralgias and back pain.  Skin: Negative for color change and rash.  Neurological: Negative for syncope and light-headedness.  Psychiatric/Behavioral: Negative for agitation and confusion.  All other systems reviewed and are negative.   Physical Exam Updated Vital Signs BP 101/63   Pulse 81   Temp 97.6 F (36.4 C) (Oral)   Resp 18   SpO2 96%   Physical Exam Vitals and nursing note reviewed.  Constitutional:      Comments: Thin, hard of hearing  HENT:     Head: Normocephalic and atraumatic.  Eyes:     Conjunctiva/sclera: Conjunctivae normal.  Cardiovascular:      Rate and Rhythm: Normal rate and regular rhythm.  Pulmonary:     Effort: Pulmonary effort is normal. No respiratory distress.     Breath sounds: Rhonchi present.  Abdominal:     Palpations: Abdomen is soft.     Tenderness: There is no abdominal tenderness.  Musculoskeletal:     Cervical back: Neck supple.  Skin:    General: Skin is warm and dry.  Neurological:     General: No focal deficit present.     Mental Status: He is alert and oriented to person, place, and time.     ED Results / Procedures / Treatments   Labs (all labs ordered are listed, but only abnormal results are displayed) Labs Reviewed  BASIC METABOLIC PANEL - Abnormal; Notable for the following components:      Result Value   Sodium  133 (*)    Glucose, Bld 109 (*)    Calcium 8.4 (*)    All other components within normal limits  CBC - Abnormal; Notable for the following components:   RBC 3.17 (*)    Hemoglobin 10.3 (*)    HCT 31.0 (*)    RDW 19.6 (*)    nRBC 0.5 (*)    All other components within normal limits  BRAIN NATRIURETIC PEPTIDE - Abnormal; Notable for the following components:   B Natriuretic Peptide 2,124.2 (*)    All other components within normal limits  CBC - Abnormal; Notable for the following components:   RBC 2.98 (*)    Hemoglobin 9.6 (*)    HCT 28.9 (*)    RDW 19.6 (*)    Platelets 409 (*)    nRBC 0.5 (*)    All other components within normal limits  TROPONIN I (HIGH SENSITIVITY) - Abnormal; Notable for the following components:   Troponin I (High Sensitivity) 1,867 (*)    All other components within normal limits  TROPONIN I (HIGH SENSITIVITY) - Abnormal; Notable for the following components:   Troponin I (High Sensitivity) 1,824 (*)    All other components within normal limits  TROPONIN I (HIGH SENSITIVITY) - Abnormal; Notable for the following components:   Troponin I (High Sensitivity) 1,630 (*)    All other components within normal limits  RESPIRATORY PANEL BY RT PCR (FLU A&B,  COVID)  MAGNESIUM  TSH  HEPARIN LEVEL (UNFRACTIONATED)  HEPARIN LEVEL (UNFRACTIONATED)  BASIC METABOLIC PANEL  LIPID PANEL  CBC  TROPONIN I (HIGH SENSITIVITY)    Radiology DG Chest 2 View  Result Date: 11/17/2019 CLINICAL DATA:  Chest pain. EXAM: CHEST - 2 VIEW COMPARISON:  04/23/2002 and 01/03/2020 chest radiograph. 01/03/2020 CT chest. FINDINGS: New diffuse interstitial prominence with patchy perihilar and bibasilar opacities. No pneumothorax.  Small bilateral pleural effusions. Cardiomediastinal silhouette is partially obscured. Aortic atherosclerotic calcifications. No acute osseous abnormality. IMPRESSION: Mild to moderate pulmonary edema. Cannot exclude underlying infectious foci. Electronically Signed   By: Stana Bunting M.D.   On: 11/17/2019 13:55    Procedures .Critical Care Performed by: Terald Sleeper, MD Authorized by: Terald Sleeper, MD   Critical care provider statement:    Critical care time (minutes):  42   Critical care was necessary to treat or prevent imminent or life-threatening deterioration of the following conditions:  Cardiac failure   Critical care was time spent personally by me on the following activities:  Discussions with consultants, evaluation of patient's response to treatment, examination of patient, ordering and performing treatments and interventions, ordering and review of laboratory studies, ordering and review of radiographic studies, pulse oximetry, re-evaluation of patient's condition, obtaining history from patient or surrogate and review of old charts Comments:     NSTEMI requiring IV heparin   (including critical care time)  Medications Ordered in ED Medications  sodium chloride flush (NS) 0.9 % injection 3 mL (3 mLs Intravenous Not Given 11/17/19 1527)  heparin ADULT infusion 100 units/mL (25000 units/274mL sodium chloride 0.45%) (800 Units/hr Intravenous New Bag/Given 11/17/19 1537)  aspirin EC tablet 81 mg (0 mg Oral Hold  11/17/19 1838)  rosuvastatin (CRESTOR) tablet 10 mg (0 mg Oral Hold 11/17/19 1838)  clopidogrel (PLAVIX) tablet 75 mg (0 mg Oral Hold 11/17/19 1838)  aspirin EC tablet 81 mg (has no administration in time range)  nitroGLYCERIN (NITROSTAT) SL tablet 0.4 mg (has no administration in time range)  acetaminophen (TYLENOL) tablet 650  mg (has no administration in time range)  ondansetron (ZOFRAN) injection 4 mg (has no administration in time range)  sodium chloride flush (NS) 0.9 % injection 3 mL (has no administration in time range)  sodium chloride flush (NS) 0.9 % injection 3 mL (has no administration in time range)  0.9 %  sodium chloride infusion (has no administration in time range)  metoprolol tartrate (LOPRESSOR) tablet 12.5 mg (has no administration in time range)  zolpidem (AMBIEN) tablet 5 mg (has no administration in time range)  ALPRAZolam (XANAX) tablet 0.25 mg (has no administration in time range)  levalbuterol (XOPENEX) nebulizer solution 0.63 mg (has no administration in time range)  sodium chloride flush (NS) 0.9 % injection 3 mL (has no administration in time range)  nitroGLYCERIN 50 mg in dextrose 5 % 250 mL (0.2 mg/mL) infusion (0 mcg/min Intravenous Hold 11/17/19 1917)  sodium chloride 0.9 % bolus 1,000 mL (0 mLs Intravenous Stopped 11/17/19 1636)  nitroGLYCERIN (NITROSTAT) SL tablet 0.4 mg (0.4 mg Sublingual Given 11/17/19 1535)  heparin bolus via infusion 4,000 Units (4,000 Units Intravenous Bolus from Bag 11/17/19 1537)  ondansetron (ZOFRAN) injection 4 mg (4 mg Intravenous Given 11/17/19 1636)  albuterol (PROVENTIL) (2.5 MG/3ML) 0.083% nebulizer solution 2.5 mg (2.5 mg Nebulization Given 11/17/19 1635)  furosemide (LASIX) injection 40 mg (40 mg Intravenous Given 11/17/19 1653)    ED Course  I have reviewed the triage vital signs and the nursing notes.  Pertinent labs & imaging results that were available during my care of the patient were reviewed by me and considered in my  medical decision making (see chart for details).  84 yo male here with chest pain x 2-3 days Troponins significantly elevated No STEMI on ECG  NSTEMI - will discuss with cardiology, initiate heparin, try 1 dose SL nitro  Clinically I also suspect likely new onset CHF 2/2 to his ACS, he has pulm congestion and edema on xray, no sig resp distress in the room, not requiring supplemental O2 or bipap at this time.  I'll hold on diuresis given his low BP.    Less likely PE with no acute respiratory symptoms  I personally ordered and reviewed his bloodtests and xray, which demonstrated pulmonary edema bilaterally.  I reviewed his prior medical records including his most recent hospitalization course and echocardiogram results from March 2021.  Additional history was provided by his niece at bedside who is his PoA, as noted above.  EKG with sinus rhythm, no STEMI  Per his niece, he is FULL CODE for now.  She reports they were going to discuss his advanced directive with his PCP in the office today, but never got the chance.  Clinical Course as of Nov 17 2014  Tue Nov 17, 2019  1428 Troponin I (High Sensitivity)(!!): [phone] [MT]  1520 Spoke to cardiology team they will come evaluate patient at bedside, determine which service to admit him to   [MT]    Clinical Course User Index [MT] Lourene Hoston, Kermit Balo, MD   MDM Rules/Calculators/A&P HEAR Score: 6                     Final Clinical Impression(s) / ED Diagnoses Final diagnoses:  NSTEMI (non-ST elevated myocardial infarction) [facility])    Rx / DC Orders ED Discharge Orders    None       Terald Sleeper, MD 11/17/19 2018

## 2019-11-17 NOTE — ED Triage Notes (Signed)
Pt presents to ED via [facility] w/mid-sternum chest pain, upper airway wheezing x1 week. Pt fell 1 week ago, seen here, pt states he was dx w/ bruising no acute injuries noted on XR's. Immediately returning home from last visit he developed the chest pain and wheezing. Niece came to visit today and called 911.   106/67 83 20 100% RA  CBG 128 20g LAC  324 mg ASA 0.4 Nitro SL x1 w/no relief

## 2019-11-17 NOTE — Chronic Care Management (AMB) (Signed)
  Chronic Care Management   Outreach Note  11/17/2019 Name: [name] MRN: [id number] DOB: Jun 03, 1936  Referred by: Patient, No Pcp Per Reason for referral : Care Coordination   SW placed an unsuccessful outbound call to the patients caregiver Weldon Picking to assist with resource needs. SW was informed Mrs. Sandy Salaam was not available at the time of today's call. SW left name and phone number requesting a return call.  Follow Up Plan: The care management team will reach out to the patient again over the next 10 days.   Bevelyn Ngo, BSW, CDP Social Worker, Certified Dementia Practitioner [facility] Care Management [phone]

## 2019-11-17 NOTE — Chronic Care Management (AMB) (Signed)
Chronic Care Management   Follow Up Note   11/13/2019 Name: [name] MRN: [id number] DOB: [birth year]  Referred by: Patient, No Pcp Per Reason for referral : Chronic Care Management (FU Inbound Call from niece)   [name] is a 84 y.o. year old male who is a primary care patient of Patient, No Pcp Per. The CCM team was consulted for assistance with chronic disease management and care coordination needs.    Review of patient status, including review of consultants reports, relevant laboratory and other test results, and collaboration with appropriate care team members and the patient's provider was performed as part of comprehensive patient evaluation and provision of chronic care management services.    SDOH (Social Determinants of Health) assessments performed: Yes See Care Plan activities for detailed interventions related to [place])   Inbound call received from niece [name] to discuss patient's recent ED visit following a fall in his home.    Outpatient Encounter Medications as of 11/13/2019  Medication Sig  . aspirin EC 81 MG tablet Take 81 mg by mouth daily.  . cetirizine (ZYRTEC) 10 MG tablet Take 10 mg by mouth as needed for allergies.  . Cholecalciferol (VITAMIN D) 50 MCG (2000 UT) tablet Take 2,000 Units by mouth daily.  . clopidogrel (PLAVIX) 75 MG tablet TAKE 1 TABLET BY MOUTH DAILY  . clopidogrel (PLAVIX) 75 MG tablet Take 75 mg by mouth daily.  . diclofenac sodium (VOLTAREN) 1 % GEL Apply 2 g topically 4 (four) times daily. Rub into affected area of foot 2 to 4 times daily  . diltiazem (CARDIZEM CD) 180 MG 24 hr capsule TAKE ONE CAPSULE BY MOUTH EVERY DAY  . diltiazem (CARDIZEM CD) 180 MG 24 hr capsule Take 180 mg by mouth daily.  . hydrochlorothiazide (HYDRODIURIL) 12.5 MG tablet TAKE 1 TABLET BY MOUTH ONCE DAILY  . lisinopril (ZESTRIL) 10 MG tablet TAKE 1 TABLET BY MOUTH DAILY  . lisinopril (ZESTRIL) 10 MG tablet Take 10 mg by mouth daily.  [name]  lisinopril-hydrochlorothiazide (ZESTORETIC) 10-12.5 MG tablet Take 1 tablet by mouth daily.  . polyethylene glycol (MIRALAX / GLYCOLAX) 17 g packet Take 17 g by mouth daily.  . rosuvastatin (CRESTOR) 10 MG tablet TAKE 1 TABLET BY MOUTH EVERY DAY  . rosuvastatin (CRESTOR) 10 MG tablet Take 10 mg by mouth daily.  [name] VITAMIN D PO Take 1 tablet by mouth daily.  [name] zinc gluconate 50 MG tablet Take 50 mg by mouth daily.  [name] zinc gluconate 50 MG tablet Take 50 mg by mouth daily.   No facility-administered encounter medications on file as of 11/13/2019.     Objective:  Lab Results  Component Value Date   HGBA1C 6.0 (H) 06/30/2019   HGBA1C 6.0 (H) 02/17/2019   HGBA1C 5.9 (H) 05/15/2018   Lab Results  Component Value Date   LDLCALC 84 06/30/2019   CREATININE 1.05 11/05/2019   BP Readings from Last 3 Encounters:  11/05/19 107/65  10/09/19 112/62  06/30/19 122/70   Goals Addressed      Patient Stated   . "We could use some help with medication management" (pt-stated)       CARE PLAN ENTRY (see longitudinal plan of care for additional care plan information)  Current Barriers:  [name] Knowledge Deficits related to best options for medication management of Polypharmacy . Chronic Disease Management support and education needs related to HTN, CVA, prediabetes   Nurse Case Manager Clinical Goal(s):  [name] Over the next 30 days,  patient will work with embedded Pharm D to address needs related to medication management and adherence   CCM RN CM Interventions:  . Inter-disciplinary care team collaboration (see longitudinal plan of care) . Reviewed medications with patient and discussed patient had 1 medication change post d/c; Determined his HCTZ was discontinued; Determined patient is self administering his medications with the help from his niece [name], she is filling individual medication cups and the patient is able to Self administer his own medications; Determined niece is concerned about using this  system and would like for a Pharmacist to help with medication management with Polypharmacy needs . Discussed plans with patient for ongoing care management follow up and provided patient with direct contact information for care management team . Pharmacy referral for assistance with Medication management   Patient Self Care Activities:  . Self administers medications as prescribed (niece [name] fills medication cups) . Attends all scheduled provider appointments . Performs ADL's independently . Performs IADL's independently  Initial goal documentation       Other   . COMPLETED: "He is having pain and swelling to his right ankle"       Niece stated  Current Barriers:  [name] Knowledge Deficits related to diagnosis and treatment for right ankle pain and swelling   Nurse Case Manager Clinical Goal(s):  [name] Over the next 30 days, patient/niece [name] will verbalize understanding of plan for diagnosis and treatment management for right ankle pain and swelling. . Over the next 30 days, patient will have no mobility issues related to pain or swelling to his right ankle. . 05/14/2019- Over the next 60 days the patient will work with RN Case Manager to develop a better understanding of recent vascular studies as evidenced by ability to verbalize basic understanding of signs and symptoms of a blood clot  CCM RN CM Interventions:  11/13/18 call completed with niece [name]  . Evaluation of current treatment plan related to right ankle pain and swelling and patient's adherence to plan as established by provider . Discussed plans with patient for ongoing care management follow up and provided patient with direct contact information for care management team  Patient Self Care Activities:  . Self administers medications as prescribed . Attends all scheduled provider appointments . Performs ADL's independently . Performs IADL's independently  Please see past updates related to this goal by  clicking on the "Past Updates" button in the selected goal      . "I need help applying for Medicaid and getting an emergency alert system in place"       Niece [name] stated [place] (see longitudinal plan of care for additional care plan information)  Current Barriers:  [name] Knowledge Deficits related to Resources needed for an emergency alert system, How to apply for Medicaid and How to obtain legal services for help with tax services for Mr. Aida Puffer . Chronic Disease Management support and education needs related to HTN, CVA, prediabetes  Nurse Case Manager Clinical Goal(s):  [name] Over the next 30 days, patient will work with embedded BSW Daneen Schick to address needs related to    CCM RN CM Interventions:  . Inter-disciplinary care team collaboration (see longitudinal plan of care) . Collaborated with embedded BSW Daneen Schick  regarding patient/caregiver resources needs . Discussed plans with patient for ongoing care management follow up and provided patient with direct contact information for care management team . Social Work referral for Resource needs as mentioned above   Patient Self Care  Activities:  . Self administers medications as prescribed (niece [name] fills medication cups) . Attends all scheduled provider appointments . Performs ADL's independently . Performs IADL's independently  Initial goal documentation     . COMPLETED: "It would be good for him to learn what foods to eat"       Niece stated Current Barriers:  [name] Knowledge Deficits related to Diabetes disease process and Self Health Management   Nurse Case Manager Clinical Goal(s):  [name] Over the next 90 days, patient will work with the CCM team  to address needs related to Diabetes disease education and nutritional recommendations.   02/04/19 - target goal date re-established to 90 days due to COVID-19 treatment delays Goal Met   CCM RN CM Interventions:  11/13/18 call completed with niece [name]  . Evaluation of current treatment plan related to prediabetes and patient's adherence to plan as established by provider . Determined patient is receiving home delivered meals; Determined he is now living next door to his niece [name] and several other nieces who are all available to check on him and help with warming meals etc., as needed . Determined patient is adhering to following a heart healthy diabetic friendly diet  . Discussed plans with patient for ongoing care management follow up and provided patient with direct contact information for care management team  Patient Self Care Activities:  . Attends all scheduled provider appointments . Calls pharmacy for medication refills . Calls provider office for new concerns or questions  Please see past updates related to this goal by clicking on the "Past Updates" button in the selected goal       . Post discharge follow up       [place] (see longitudinal plan of care for additional care plan information)  Current Barriers:  [name] Knowledge Deficits related to home safety and fall prevention  . Chronic Disease Management support and education needs related to HTN, CVA, prediabetes  Nurse Case Manager Clinical Goal(s):  [name] Over the next 30 days, patient will work with in home PT/OT for strengthening and balance and will adhere to his recommended HEP plan.  . Over the next 90 days, patient will work with CCM RN CM and PCP to address needs related to disease education and support to improve Self Health management of chronic conditions . Over the next 90 days, patient will experience having no falls or injury's related to falls   CCM RN CM Interventions:  11/13/19 call completed with niece [name] . Inter-disciplinary care team collaboration (see longitudinal plan of care) . Determined patient had a fall in his home; Determined he now has a new apartment with hard woods and was not wearing skid proof socks . Determined  patient is now living next door to his niece [name] and several other family member's live in the same neighborhood . Determined niece plans to initiate an emergency alert system as soon as possible and will collaborate with the embedded BSW for additional resources . Evaluation of current treatment plan related to [facility] and post MD followup  and patient's adherence to plan as established by provider . Determined patient will have in home PT and OT from [facility]; Determined SNV was not ordered upon d/c; Determined a CNA was also ordered to assist with personal hygiene while PT/OT services are active but [facility] does not have aide available  . Sent in basket message to PCP provider [name], FNP requesting orders for  SNV to assist with medication management, assess R arm abrasion and monitor BP/HR  . Collaborated with [facility], spoke with [place] regarding [facility] and CNA needs; discussed Carlyon Shadow will fax order to the office for SNV orders per PCP; Determined a CNA will be available mid next week . Updated niece [name] regarding pending orders for [facility] and alerted her to the update regarding CNA availability for mid next week . Discussed plans with patient for ongoing care management follow up and provided patient with direct contact information for care management team  Patient Self Care Activities:  . Self administers medications as prescribed (niece [name] fills medication cups) . Attends all scheduled provider appointments . Performs ADL's independently . Performs IADL's independently  Initial goal documentation       Plan:   The care management team will reach out to the patient again over the next 14-21 days.   Barb Merino, RN, BSN, CCM Care Management Coordinator [place] [facility]  Direct Phone: [phone]

## 2019-11-17 NOTE — Progress Notes (Signed)
ANTICOAGULATION CONSULT NOTE - Initial Consult  Pharmacy Consult for Heparin Indication: chest pain/ACS  No Known Allergies  Patient Measurements:   Heparin Dosing Weight: TBW  Vital Signs: Temp: 97.6 F (36.4 C) (04/13 1310) Temp Source: Oral (04/13 1310) BP: 113/64 (04/13 1310) Pulse Rate: 78 (04/13 1310)  Labs: Recent Labs    11/17/19 1324  HGB 10.3*  HCT 31.0*  PLT 399  CREATININE 1.05  TROPONINIHS 1,867*    Estimated Creatinine Clearance: 48.9 mL/min (by C-G formula based on SCr of 1.05 mg/dL).   Medical History: Past Medical History:  Diagnosis Date  . Arthritis   . Bundle branch block   . Hypercholesteremia   . Hyperlipidemia 2001  . Hypertension 2001  . Hypertension   . Peripheral vascular disease (HCC)   . Stroke (HCC)   . Stroke (HCC)    2001 - R side deficits   Assessment: 35 YOM presenting with CP, not on anticoagulation PTA, chronic anemia stable, plts wnl.    Goal of Therapy:  Heparin level 0.3-0.7 units/ml Monitor platelets by anticoagulation protocol: Yes   Plan:  Heparin 4000 units IV x 1, and gtt at 800 units/hr F/u 8 hour heparin level  Daylene Posey, PharmD Clinical Pharmacist ED Pharmacist Phone # [phone] 11/17/2019 3:13 PM

## 2019-11-18 ENCOUNTER — Encounter (HOSPITAL_COMMUNITY)
Admission: EM | Disposition: A | Discharge status: Skilled Nursing Facility | Payer: Self-pay | Source: Home / Self Care | Attending: Cardiothoracic Surgery

## 2019-11-18 DIAGNOSIS — L8990 Pressure ulcer of unspecified site, unspecified stage: Secondary | ICD-10-CM | POA: Insufficient documentation

## 2019-11-18 DIAGNOSIS — I501 Left ventricular failure: Secondary | ICD-10-CM | POA: Diagnosis not present

## 2019-11-18 DIAGNOSIS — I739 Peripheral vascular disease, unspecified: Secondary | ICD-10-CM | POA: Diagnosis not present

## 2019-11-18 DIAGNOSIS — I214 Non-ST elevation (NSTEMI) myocardial infarction: Secondary | ICD-10-CM | POA: Diagnosis not present

## 2019-11-18 HISTORY — PX: LEFT HEART CATH AND CORONARY ANGIOGRAPHY: CATH118249

## 2019-11-18 HISTORY — PX: ABDOMINAL AORTOGRAM: CATH118222

## 2019-11-18 LAB — BASIC METABOLIC PANEL
Anion gap: 9 (ref 5–15)
BUN: 22 mg/dL (ref 8–23)
CO2: 21 mmol/L — ABNORMAL LOW (ref 22–32)
Calcium: 8 mg/dL — ABNORMAL LOW (ref 8.9–10.3)
Chloride: 105 mmol/L (ref 98–111)
Creatinine, Ser: 0.98 mg/dL (ref 0.61–1.24)
GFR calc Af Amer: >60 mL/min (ref >60)
GFR calc non Af Amer: >60 mL/min (ref >60)
Glucose, Bld: 115 mg/dL — ABNORMAL HIGH (ref 70–99)
Potassium: 4.5 mmol/L (ref 3.5–5.1)
Sodium: 135 mmol/L (ref 135–145)

## 2019-11-18 LAB — CBC
HCT: 26.6 % — ABNORMAL LOW (ref 39.0–52.0)
HCT: 27.5 % — ABNORMAL LOW (ref 39.0–52.0)
Hemoglobin: 8.9 g/dL — ABNORMAL LOW (ref 13.0–17.0)
Hemoglobin: 9.2 g/dL — ABNORMAL LOW (ref 13.0–17.0)
MCH: 32.4 pg (ref 26.0–34.0)
MCH: 33.2 pg (ref 26.0–34.0)
MCHC: 33.5 g/dL (ref 30.0–36.0)
MCHC: 33.5 g/dL (ref 30.0–36.0)
MCV: 96.7 fL (ref 80.0–100.0)
MCV: 99.3 fL (ref 80.0–100.0)
Platelets: 365 10*3/uL (ref 150–400)
Platelets: 390 10*3/uL (ref 150–400)
RBC: 2.75 MIL/uL — ABNORMAL LOW (ref 4.22–5.81)
RBC: 2.77 MIL/uL — ABNORMAL LOW (ref 4.22–5.81)
RDW: 19.5 % — ABNORMAL HIGH (ref 11.5–15.5)
RDW: 19.9 % — ABNORMAL HIGH (ref 11.5–15.5)
WBC: 10 10*3/uL (ref 4.0–10.5)
WBC: 9.9 10*3/uL (ref 4.0–10.5)
nRBC: 0.4 % — ABNORMAL HIGH (ref 0.0–0.2)
nRBC: 0.5 % — ABNORMAL HIGH (ref 0.0–0.2)

## 2019-11-18 LAB — LIPID PANEL
Cholesterol: 108 mg/dL (ref 0–200)
HDL: 43 mg/dL (ref >40)
LDL Cholesterol: 56 mg/dL (ref 0–99)
Total CHOL/HDL Ratio: 2.5 RATIO
Triglycerides: 44 mg/dL (ref <150)
VLDL: 9 mg/dL (ref 0–40)

## 2019-11-18 LAB — GLUCOSE, CAPILLARY
Glucose-Capillary: 99 mg/dL (ref 70–99)
Glucose-Capillary: 89 mg/dL (ref 70–99)
Glucose-Capillary: 101 mg/dL — ABNORMAL HIGH (ref 70–99)
Glucose-Capillary: 91 mg/dL (ref 70–99)
Glucose-Capillary: 88 mg/dL (ref 70–99)

## 2019-11-18 LAB — HEPARIN LEVEL (UNFRACTIONATED)
Heparin Unfractionated: 0.52 IU/mL (ref 0.30–0.70)
Heparin Unfractionated: 0.38 IU/mL (ref 0.30–0.70)

## 2019-11-18 SURGERY — LEFT HEART CATH AND CORONARY ANGIOGRAPHY
Anesthesia: LOCAL

## 2019-11-18 MED ORDER — PANTOPRAZOLE SODIUM 40 MG PO TBEC
40.0000 mg | DELAYED_RELEASE_TABLET | Freq: Every day | ORAL | Status: DC
  Administered 2019-11-18 – 2019-11-19 (×2): 40 mg via ORAL
  Filled 2019-11-18 (×2): qty 1

## 2019-11-18 MED ORDER — SODIUM CHLORIDE 0.9% FLUSH: 3.0000 mL | INTRAVENOUS | Status: DC | PRN

## 2019-11-18 MED ORDER — FENTANYL CITRATE (PF) 100 MCG/2ML IJ SOLN
INTRAMUSCULAR | Status: AC
  Filled 2019-11-18: qty 2

## 2019-11-18 MED ORDER — HEPARIN (PORCINE) IN NACL 1000-0.9 UT/500ML-% IV SOLN
INTRAVENOUS | Status: DC | PRN
  Administered 2019-11-18: 500 mL

## 2019-11-18 MED ORDER — HEPARIN (PORCINE) IN NACL 1000-0.9 UT/500ML-% IV SOLN
INTRAVENOUS | Status: AC
  Filled 2019-11-18: qty 1000

## 2019-11-18 MED ORDER — SODIUM CHLORIDE 0.9 % IV SOLN: 250.0000 mL | INTRAVENOUS | Status: DC | PRN

## 2019-11-18 MED ORDER — HEPARIN (PORCINE) 25000 UT/250ML-% IV SOLN
700.0000 [IU]/h | INTRAVENOUS | Status: DC
  Administered 2019-11-18 – 2019-11-20 (×2): 800 [IU]/h via INTRAVENOUS
  Filled 2019-11-18 (×2): qty 250

## 2019-11-18 MED ORDER — ONDANSETRON HCL 4 MG/2ML IJ SOLN: 4.0000 mg | Freq: Four times a day (QID) | INTRAMUSCULAR | Status: DC | PRN

## 2019-11-18 MED ORDER — HYDRALAZINE HCL 20 MG/ML IJ SOLN: 10.0000 mg | INTRAMUSCULAR | Status: AC | PRN

## 2019-11-18 MED ORDER — CHLORHEXIDINE GLUCONATE CLOTH 2 % EX PADS
6.0000 | MEDICATED_PAD | Freq: Every day | CUTANEOUS | Status: DC
  Administered 2019-11-18 – 2019-12-02 (×14): 6 via TOPICAL

## 2019-11-18 MED ORDER — PANTOPRAZOLE SODIUM 40 MG PO TBEC: 40.0000 mg | DELAYED_RELEASE_TABLET | Freq: Every day | ORAL | Status: DC

## 2019-11-18 MED ORDER — FENTANYL CITRATE (PF) 100 MCG/2ML IJ SOLN
INTRAMUSCULAR | Status: DC | PRN
  Administered 2019-11-18: 25 ug via INTRAVENOUS

## 2019-11-18 MED ORDER — ACETAMINOPHEN 325 MG PO TABS
650.0000 mg | ORAL_TABLET | ORAL | Status: DC | PRN
  Administered 2019-11-20 – 2019-12-02 (×8): 650 mg via ORAL
  Filled 2019-11-18 (×7): qty 2

## 2019-11-18 MED ORDER — LABETALOL HCL 5 MG/ML IV SOLN: 10.0000 mg | INTRAVENOUS | Status: AC | PRN

## 2019-11-18 MED ORDER — FUROSEMIDE 10 MG/ML IJ SOLN
40.0000 mg | Freq: Two times a day (BID) | INTRAMUSCULAR | Status: AC
  Administered 2019-11-18 – 2019-11-19 (×2): 40 mg via INTRAVENOUS
  Filled 2019-11-18 (×2): qty 4

## 2019-11-18 MED ORDER — LIDOCAINE HCL (PF) 1 % IJ SOLN
INTRAMUSCULAR | Status: AC
  Filled 2019-11-18: qty 30

## 2019-11-18 MED ORDER — LIDOCAINE HCL (PF) 1 % IJ SOLN
INTRAMUSCULAR | Status: DC | PRN
  Administered 2019-11-18: 15 mL

## 2019-11-18 MED ORDER — IOHEXOL 350 MG/ML SOLN
INTRAVENOUS | Status: DC | PRN
  Administered 2019-11-18: 105 mL

## 2019-11-18 MED ORDER — SODIUM CHLORIDE 0.9% FLUSH
3.0000 mL | Freq: Two times a day (BID) | INTRAVENOUS | Status: DC
  Administered 2019-11-18 – 2019-11-26 (×8): 3 mL via INTRAVENOUS

## 2019-11-18 MED ORDER — MIDAZOLAM HCL 2 MG/2ML IJ SOLN
INTRAMUSCULAR | Status: DC | PRN
  Administered 2019-11-18: 1 mg via INTRAVENOUS

## 2019-11-18 MED ORDER — MIDAZOLAM HCL 2 MG/2ML IJ SOLN
INTRAMUSCULAR | Status: AC
  Filled 2019-11-18: qty 2

## 2019-11-18 SURGICAL SUPPLY — 9 items
CATH INFINITI 5FR JL5 (CATHETERS) ×1 IMPLANT
CATH INFINITI 5FR MULTPACK ANG (CATHETERS) ×1 IMPLANT
KIT HEART LEFT (KITS) ×2 IMPLANT
PACK CARDIAC CATHETERIZATION (CUSTOM PROCEDURE TRAY) ×2 IMPLANT
SHEATH PINNACLE 5F 10CM (SHEATH) ×1 IMPLANT
SYR MEDRAD MARK 7 150ML (SYRINGE) ×1 IMPLANT
TRANSDUCER W/STOPCOCK (MISCELLANEOUS) ×2 IMPLANT
TUBING CIL FLEX 10 FLL-RA (TUBING) ×2 IMPLANT
WIRE EMERALD 3MM-J .035X150CM (WIRE) ×1 IMPLANT

## 2019-11-18 NOTE — Plan of Care (Signed)
[name] is currently on heparin and NTG gtts. Was diuresed in ED d/t pulmonary edema w/SOB. Scheduled for heart cath at 1130 today. CP is resolved s/p NTG, but pt continues to report GI symptoms: epigastric abdominal pain + heartburn (unchanged by maalox), N/V (unchanged by IV Zofran), hematemesis (small amt dark red-brown emesis x2), belching. H&H stable. Cards MD ordered Protonix, pt says his stomach "feels better now".   Will continue to monitor.  Problem: Education: Goal: Knowledge of General Education information will improve Description: Including pain rating scale, medication(s)/side effects and non-pharmacologic comfort measures Outcome: Progressing   Problem: Health Behavior/Discharge Planning: Goal: Ability to manage health-related needs will improve Outcome: Progressing   Problem: Clinical Measurements: Goal: Ability to maintain clinical measurements within normal limits will improve Outcome: Progressing Goal: Will remain free from infection Outcome: Progressing Goal: Diagnostic test results will improve Outcome: Progressing Goal: Respiratory complications will improve Outcome: Progressing Goal: Cardiovascular complication will be avoided Outcome: Progressing   Problem: Activity: Goal: Risk for activity intolerance will decrease Outcome: Progressing   Problem: Nutrition: Goal: Adequate nutrition will be maintained Outcome: Progressing   Problem: Coping: Goal: Level of anxiety will decrease Outcome: Progressing   Problem: Elimination: Goal: Will not experience complications related to bowel motility Outcome: Progressing Goal: Will not experience complications related to urinary retention Outcome: Progressing   Problem: Pain Managment: Goal: General experience of comfort will improve Outcome: Progressing   Problem: Safety: Goal: Ability to remain free from injury will improve Outcome: Progressing   Problem: Skin Integrity: Goal: Risk for impaired skin  integrity will decrease Outcome: Progressing   Problem: Education: Goal: Understanding of cardiac disease, CV risk reduction, and recovery process will improve Outcome: Progressing Goal: Understanding of medication regimen will improve Outcome: Progressing Goal: Individualized Educational Video(s) Outcome: Progressing   Problem: Activity: Goal: Ability to tolerate increased activity will improve Outcome: Progressing   Problem: Cardiac: Goal: Ability to achieve and maintain adequate cardiopulmonary perfusion will improve Outcome: Progressing

## 2019-11-18 NOTE — Progress Notes (Signed)
Site area: Economist Prior to Removal:  Level 0 Pressure Applied For: 25 min Manual:   yes Patient Status During Pull:  Responds to voice Post Pull Site:  Level 0 Post Pull Instructions Given:  Instructions given. Pt understands. Post Pull Pulses Present: Doppler lt pt/marked Dressing Applied:  Tegaderm and a 4x4 Bedrest begins @ 12:15:00 Comments: Pt leaves cath lab holding area in stable condition. Lt groin unremarkable.

## 2019-11-18 NOTE — Progress Notes (Signed)
ANTICOAGULATION CONSULT NOTE   Pharmacy Consult for Heparin Indication: chest pain/ACS  No Known Allergies  Patient Measurements: Height: 5\' 6"  (167.6 cm) Weight: 74.3 kg (163 lb 12.8 oz) IBW/kg (Calculated) : 63.8 Heparin Dosing Weight: TBW  Vital Signs: Temp: 98 F (36.7 C) (04/14 0808) Temp Source: Oral (04/14 0808) BP: 85/62 (04/14 1300) Pulse Rate: 68 (04/14 1300)  Labs: Recent Labs    11/17/19 1324 11/17/19 1324 11/17/19 1523 11/17/19 1523 11/17/19 1822 11/17/19 2110 11/17/19 2322 11/18/19 0042 11/18/19 0222  HGB 10.3*   < > 9.6*   < >  --   --   --  9.2* 8.9*  HCT 31.0*   < > 28.9*  --   --   --   --  27.5* 26.6*  PLT 399   < > 409*  --   --   --   --  390 365  HEPARINUNFRC  --   --   --   --   --   --  0.52  --  0.38  CREATININE 1.05  --   --   --   --   --   --   --  0.98  TROPONINIHS 1,867*   < > 1,824*  --  1,630* 1,794*  --   --   --    < > = values in this interval not displayed.    Estimated Creatinine Clearance: 52.4 mL/min (by C-G formula based on SCr of 0.98 mg/dL).   Medical History: Past Medical History:  Diagnosis Date  . Arthritis   . Bundle branch block   . Hypercholesteremia   . Hyperlipidemia 2001  . Hypertension 2001  . Hypertension   . Peripheral vascular disease (HCC)   . Stroke (HCC)   . Stroke [facility])    2001 - R side deficits   Assessment: 58 YOM presenting with chest pain, not on anticoagulation PTA. Patient went to cath lab today; significant disease burden was found. Surgery consult was placed for possible CABG. Pharmacy asked to restart heparin with no bolus 4 hours after sheath removal. Documented sheath remove was at 12:15 pm today. Prior to cath, patient was therapeutic on heparin gtt 800 units/hr. Plt wnl, Hbg low but stable.   Goal of Therapy:  Heparin level 0.3-0.7 units/ml Monitor platelets by anticoagulation protocol: Yes   Plan:  Restart heparin at 800 units/hr this afternoon at 16:15 pm  Obtain 8hr heparin  level  Monitor daily heparin level, CBC, and s/sx of bleeding  F/u surgery plan   97, PharmD  PGY1 Acute Care Pharmacy Resident

## 2019-11-18 NOTE — Interval H&P Note (Signed)
Cath Lab Visit (complete for each Cath Lab visit)  Clinical Evaluation Leading to the Procedure:   ACS: Yes.    Non-ACS:    Anginal Classification: CCS IV  Anti-ischemic medical therapy: Minimal Therapy (1 class of medications)  Non-Invasive Test Results: No non-invasive testing performed  Prior CABG: No previous CABG      History and Physical Interval Note:  11/18/2019 10:47 AM  [name]  has presented today for surgery, with the diagnosis of n stemi.  The various methods of treatment have been discussed with the patient and family. After consideration of risks, benefits and other options for treatment, the patient has consented to  Procedure(s): LEFT HEART CATH AND CORONARY ANGIOGRAPHY (N/A) as a surgical intervention.  The patient's history has been reviewed, patient examined, no change in status, stable for surgery.  I have reviewed the patient's chart and labs.  Questions were answered to the patient's satisfaction.     Lance Muss

## 2019-11-18 NOTE — Progress Notes (Signed)
 Progress Note  Patient Name: [name] Date of Encounter: 11/18/2019  Primary Cardiologist: Henry W Smith III, MD   Subjective   [name] is feeling much better this morning.  He is not short of breath and he denies chest pain.  He is on IV heparin and nitroglycerin.  Inpatient Medications    Scheduled Meds: . aspirin EC  81 mg Oral Daily  . Chlorhexidine Gluconate Cloth  6 each Topical Daily  . clopidogrel  75 mg Oral Daily  . levalbuterol  0.63 mg Nebulization Once  . metoprolol tartrate  12.5 mg Oral BID  . pantoprazole  40 mg Oral Q1200  . rosuvastatin  10 mg Oral Daily  . sodium chloride flush  3 mL Intravenous Once  . sodium chloride flush  3 mL Intravenous Q12H  . sodium chloride flush  3 mL Intravenous Q12H   Continuous Infusions: . sodium chloride    . sodium chloride 10 mL/hr at 11/18/19 0800  . sodium chloride 1 mL/kg/hr (11/18/19 0607)  . heparin 800 Units/hr (11/18/19 0800)  . nitroGLYCERIN 15 mcg/min (11/18/19 0800)   PRN Meds: sodium chloride, sodium chloride, acetaminophen, ALPRAZolam, alum & mag hydroxide-simeth, magnesium hydroxide, nitroGLYCERIN, ondansetron (ZOFRAN) IV, sodium chloride flush, sodium chloride flush, zolpidem   Vital Signs    Vitals:   11/18/19 0715 11/18/19 0730 11/18/19 0800 11/18/19 0808  BP:  93/63 95/64   Pulse: 72 67 71   Resp: 14 14 14   Temp:    98 F (36.7 C)  TempSrc:    Oral  SpO2: 96% 95% 96%   Weight:      Height:        Intake/Output Summary (Last 24 hours) at 11/18/2019 0903 Last data filed at 11/18/2019 0800 Gross per 24 hour  Intake 1702.59 ml  Output 2105 ml  Net -402.41 ml   Last 3 Weights 11/18/2019 11/17/2019 11/05/2019  Weight (lbs) 163 lb 12.8 oz 160 lb 11.5 oz 148 lb 5.9 oz  Weight (kg) 74.3 kg 72.9 kg 67.3 kg      Telemetry    Normal sinus rhythm- Personally Reviewed  ECG    Normal sinus rhythm at 72 with early R wave transition, small inferior Q waves, LVH voltage and subtle lateral ST  segment depression.- Personally Reviewed  Physical Exam   GEN: No acute distress.   Neck: No JVD Cardiac: RRR, no murmurs, rubs, or gallops.  Respiratory: Clear to auscultation bilaterally. GI: Soft, nontender, non-distended  MS: No edema; No deformity. Neuro:  Nonfocal  Psych: Normal affect   Labs    High Sensitivity Troponin:   Recent Labs  Lab 11/03/19 1649 11/17/19 1324 11/17/19 1523 11/17/19 1822 11/17/19 2110  TROPONINIHS 402* 1,867* 1,824* 1,630* 1,794*      Chemistry Recent Labs  Lab 11/17/19 1324 11/18/19 0222  NA 133* 135  K 4.1 4.5  CL 102 105  CO2 22 21*  GLUCOSE 109* 115*  BUN 11 22  CREATININE 1.05 0.98  CALCIUM 8.4* 8.0*  GFRNONAA >60 >60  GFRAA >60 >60  ANIONGAP 9 9     Hematology Recent Labs  Lab 11/17/19 1523 11/18/19 0042 11/18/19 0222  WBC 8.6 10.0 9.9  RBC 2.98* 2.77* 2.75*  HGB 9.6* 9.2* 8.9*  HCT 28.9* 27.5* 26.6*  MCV 97.0 99.3 96.7  MCH 32.2 33.2 32.4  MCHC 33.2 33.5 33.5  RDW 19.6* 19.9* 19.5*  PLT 409* 390 365    BNP Recent Labs  Lab 11/17/19 1444    BNP 2,124.2*     DDimer No results for input(s): DDIMER in the last 168 hours.   Radiology    DG Chest 2 View  Result Date: 11/17/2019 CLINICAL DATA:  Chest pain. EXAM: CHEST - 2 VIEW COMPARISON:  04/23/2002 and 01/03/2020 chest radiograph. 01/03/2020 CT chest. FINDINGS: New diffuse interstitial prominence with patchy perihilar and bibasilar opacities. No pneumothorax.  Small bilateral pleural effusions. Cardiomediastinal silhouette is partially obscured. Aortic atherosclerotic calcifications. No acute osseous abnormality. IMPRESSION: Mild to moderate pulmonary edema. Cannot exclude underlying infectious foci. Electronically Signed   By: Stana Bunting M.D.   On: 11/17/2019 13:55    Cardiac Studies   None  Patient Profile     [name] is a 84 y.o. male with pmh of HTN, HLD, CVA in 2001 in right sided weakness, prediabetes, B/L carotid artery disease  (1-39%), PAD, RBBB, sinus bradycardia who was admitted yesterday with chest pain and shortness of breath.  His enzymes were positive and his BNP was elevated.  He had pulmonary edema on chest x-ray.  He did have subtle EKG changes.  He was diuresed overnight and feels clinically improved today awaiting right left heart cath.  Assessment & Plan    1: Non-STEMI-troponin rose to 1700.  He no longer has chest pain on IV nitroglycerin.  He does have subtle ST segment depression laterally.  Plan coronary angiography today. The patient understands that risks included but are not limited to stroke (1 in 1000), death (1 in 1000), kidney failure [usually temporary] (1 in 500), bleeding (1 in 200), allergic reaction [possibly serious] (1 in 200). The patient understands and agrees to proceed  2: Essential hypertension-blood pressure 100/60 today.  His Cardizem and hydrochlorothiazide lisinopril have been placed on hold.  He is on low-dose beta-blocker.  3: Hyperlipidemia-on statin therapy  4: Acute pulmonary edema-BNP was elevated.  Chest x-ray consistent with pulmonary edema.  He was diuresed 2 L and feels clinically improved.  He has no peripheral edema this morning and his lungs are clear.  Clinically improved with diuresis, IV heparin nitroglycerin.  Plan right and left heart cath today.  2D echo performed 2 weeks ago showed normal LV systolic function.  For questions or updates, please contact [facility] Please consult [email or web address] for contact info under        Signed, Nanetta Batty, MD  11/18/2019, 9:03 AM

## 2019-11-18 NOTE — Progress Notes (Signed)
ANTICOAGULATION CONSULT NOTE   Pharmacy Consult for Heparin Indication: chest pain/ACS  No Known Allergies  Patient Measurements: Height: 5\' 6"  (167.6 cm) Weight: 72.9 kg (160 lb 11.5 oz) IBW/kg (Calculated) : 63.8 Heparin Dosing Weight: TBW  Vital Signs: Temp: 98.7 F (37.1 C) (04/13 2300) Temp Source: Oral (04/13 2300) BP: 105/63 (04/14 0030) Pulse Rate: 74 (04/14 0030)  Labs: Recent Labs    11/17/19 1324 11/17/19 1324 11/17/19 1523 11/17/19 1822 11/17/19 2110 11/17/19 2322  HGB 10.3*  --  9.6*  --   --   --   HCT 31.0*  --  28.9*  --   --   --   PLT 399  --  409*  --   --   --   HEPARINUNFRC  --   --   --   --   --  0.52  CREATININE 1.05  --   --   --   --   --   TROPONINIHS 1,867*   < > 1,824* 1,630* 1,794*  --    < > = values in this interval not displayed.    Estimated Creatinine Clearance: 48.9 mL/min (by C-G formula based on SCr of 1.05 mg/dL).   Medical History: Past Medical History:  Diagnosis Date  . Arthritis   . Bundle branch block   . Hypercholesteremia   . Hyperlipidemia 2001  . Hypertension 2001  . Hypertension   . Peripheral vascular disease (HCC)   . Stroke (HCC)   . Stroke [facility])    2001 - R side deficits   Assessment: 78 YOM presenting with CP, not on anticoagulation PTA, chronic anemia stable, plts wnl.    4/14 AM update:  Initial heparin level therapeutic   Goal of Therapy:  Heparin level 0.3-0.7 units/ml Monitor platelets by anticoagulation protocol: Yes   Plan:  Cont heparin at 800 units/hr Confirmatory heparin level with AM labs  5/14, PharmD, BCPS Clinical Pharmacist Phone: [phone]

## 2019-11-18 NOTE — H&P (View-Only) (Signed)
Progress Note  Patient Name: [name] Date of Encounter: 11/18/2019  Primary Cardiologist: Sinclair Grooms, MD   Subjective   [name] is feeling much better this morning.  He is not short of breath and he denies chest pain.  He is on IV heparin and nitroglycerin.  Inpatient Medications    Scheduled Meds: . aspirin EC  81 mg Oral Daily  . Chlorhexidine Gluconate Cloth  6 each Topical Daily  . clopidogrel  75 mg Oral Daily  . levalbuterol  0.63 mg Nebulization Once  . metoprolol tartrate  12.5 mg Oral BID  . pantoprazole  40 mg Oral Q1200  . rosuvastatin  10 mg Oral Daily  . sodium chloride flush  3 mL Intravenous Once  . sodium chloride flush  3 mL Intravenous Q12H  . sodium chloride flush  3 mL Intravenous Q12H   Continuous Infusions: . sodium chloride    . sodium chloride 10 mL/hr at 11/18/19 0800  . sodium chloride 1 mL/kg/hr (11/18/19 1062)  . heparin 800 Units/hr (11/18/19 0800)  . nitroGLYCERIN 15 mcg/min (11/18/19 0800)   PRN Meds: sodium chloride, sodium chloride, acetaminophen, ALPRAZolam, alum & mag hydroxide-simeth, magnesium hydroxide, nitroGLYCERIN, ondansetron (ZOFRAN) IV, sodium chloride flush, sodium chloride flush, zolpidem   Vital Signs    Vitals:   11/18/19 0715 11/18/19 0730 11/18/19 0800 11/18/19 0808  BP:  93/63 95/64   Pulse: 72 67 71   Resp: 14 14 14    Temp:    98 F (36.7 C)  TempSrc:    Oral  SpO2: 96% 95% 96%   Weight:      Height:        Intake/Output Summary (Last 24 hours) at 11/18/2019 0903 Last data filed at 11/18/2019 0800 Gross per 24 hour  Intake 1702.59 ml  Output 2105 ml  Net -402.41 ml   Last 3 Weights 11/18/2019 11/17/2019 11/05/2019  Weight (lbs) 163 lb 12.8 oz 160 lb 11.5 oz 148 lb 5.9 oz  Weight (kg) 74.3 kg 72.9 kg 67.3 kg      Telemetry    Normal sinus rhythm- Personally Reviewed  ECG    Normal sinus rhythm at 72 with early R wave transition, small inferior Q waves, LVH voltage and subtle lateral ST  segment depression.- Personally Reviewed  Physical Exam   GEN: No acute distress.   Neck: No JVD Cardiac: RRR, no murmurs, rubs, or gallops.  Respiratory: Clear to auscultation bilaterally. GI: Soft, nontender, non-distended  MS: No edema; No deformity. Neuro:  Nonfocal  Psych: Normal affect   Labs    High Sensitivity Troponin:   Recent Labs  Lab 11/03/19 1649 11/17/19 1324 11/17/19 1523 11/17/19 1822 11/17/19 2110  TROPONINIHS 402* 1,867* 1,824* 1,630* 1,794*      Chemistry Recent Labs  Lab 11/17/19 1324 11/18/19 0222  NA 133* 135  K 4.1 4.5  CL 102 105  CO2 22 21*  GLUCOSE 109* 115*  BUN 11 22  CREATININE 1.05 0.98  CALCIUM 8.4* 8.0*  GFRNONAA >60 >60  GFRAA >60 >60  ANIONGAP 9 9     Hematology Recent Labs  Lab 11/17/19 1523 11/18/19 0042 11/18/19 0222  WBC 8.6 10.0 9.9  RBC 2.98* 2.77* 2.75*  HGB 9.6* 9.2* 8.9*  HCT 28.9* 27.5* 26.6*  MCV 97.0 99.3 96.7  MCH 32.2 33.2 32.4  MCHC 33.2 33.5 33.5  RDW 19.6* 19.9* 19.5*  PLT 409* 390 365    BNP Recent Labs  Lab 11/17/19 1444  BNP 2,124.2*     DDimer No results for input(s): DDIMER in the last 168 hours.   Radiology    DG Chest 2 View  Result Date: 11/17/2019 CLINICAL DATA:  Chest pain. EXAM: CHEST - 2 VIEW COMPARISON:  04/23/2002 and 01/03/2020 chest radiograph. 01/03/2020 CT chest. FINDINGS: New diffuse interstitial prominence with patchy perihilar and bibasilar opacities. No pneumothorax.  Small bilateral pleural effusions. Cardiomediastinal silhouette is partially obscured. Aortic atherosclerotic calcifications. No acute osseous abnormality. IMPRESSION: Mild to moderate pulmonary edema. Cannot exclude underlying infectious foci. Electronically Signed   By: Stana Bunting M.D.   On: 11/17/2019 13:55    Cardiac Studies   None  Patient Profile     [name] is a 84 y.o. male with pmh of HTN, HLD, CVA in 2001 in right sided weakness, prediabetes, B/L carotid artery disease  (1-39%), PAD, RBBB, sinus bradycardia who was admitted yesterday with chest pain and shortness of breath.  His enzymes were positive and his BNP was elevated.  He had pulmonary edema on chest x-ray.  He did have subtle EKG changes.  He was diuresed overnight and feels clinically improved today awaiting right left heart cath.  Assessment & Plan    1: Non-STEMI-troponin rose to 1700.  He no longer has chest pain on IV nitroglycerin.  He does have subtle ST segment depression laterally.  Plan coronary angiography today. The patient understands that risks included but are not limited to stroke (1 in 1000), death (1 in 1000), kidney failure [usually temporary] (1 in 500), bleeding (1 in 200), allergic reaction [possibly serious] (1 in 200). The patient understands and agrees to proceed  2: Essential hypertension-blood pressure 100/60 today.  His Cardizem and hydrochlorothiazide lisinopril have been placed on hold.  He is on low-dose beta-blocker.  3: Hyperlipidemia-on statin therapy  4: Acute pulmonary edema-BNP was elevated.  Chest x-ray consistent with pulmonary edema.  He was diuresed 2 L and feels clinically improved.  He has no peripheral edema this morning and his lungs are clear.  Clinically improved with diuresis, IV heparin nitroglycerin.  Plan right and left heart cath today.  2D echo performed 2 weeks ago showed normal LV systolic function.  For questions or updates, please contact [facility] Please consult [email or web address] for contact info under        Signed, Nanetta Batty, MD  11/18/2019, 9:03 AM

## 2019-11-18 NOTE — Progress Notes (Signed)
Pt denies CP, heartburn, abdominal pain, and gas. Endorses constipation. Will pass on to day team to decide how to address.

## 2019-11-19 ENCOUNTER — Inpatient Hospital Stay (HOSPITAL_COMMUNITY): Payer: Medicare PPO

## 2019-11-19 ENCOUNTER — Encounter (HOSPITAL_COMMUNITY): Payer: Self-pay | Admitting: Cardiovascular Disease

## 2019-11-19 ENCOUNTER — Encounter: Payer: Self-pay | Admitting: Pharmacist

## 2019-11-19 ENCOUNTER — Ambulatory Visit: Payer: Self-pay | Admitting: *Deleted

## 2019-11-19 DIAGNOSIS — I255 Ischemic cardiomyopathy: Secondary | ICD-10-CM

## 2019-11-19 DIAGNOSIS — I10 Essential (primary) hypertension: Secondary | ICD-10-CM | POA: Diagnosis not present

## 2019-11-19 DIAGNOSIS — Z01810 Encounter for preprocedural cardiovascular examination: Secondary | ICD-10-CM

## 2019-11-19 DIAGNOSIS — E782 Mixed hyperlipidemia: Secondary | ICD-10-CM | POA: Diagnosis not present

## 2019-11-19 DIAGNOSIS — I340 Nonrheumatic mitral (valve) insufficiency: Secondary | ICD-10-CM

## 2019-11-19 DIAGNOSIS — I361 Nonrheumatic tricuspid (valve) insufficiency: Secondary | ICD-10-CM | POA: Diagnosis not present

## 2019-11-19 DIAGNOSIS — I739 Peripheral vascular disease, unspecified: Secondary | ICD-10-CM

## 2019-11-19 DIAGNOSIS — I214 Non-ST elevation (NSTEMI) myocardial infarction: Secondary | ICD-10-CM | POA: Diagnosis not present

## 2019-11-19 DIAGNOSIS — I2510 Atherosclerotic heart disease of native coronary artery without angina pectoris: Secondary | ICD-10-CM

## 2019-11-19 LAB — GLUCOSE, CAPILLARY
Glucose-Capillary: 101 mg/dL — ABNORMAL HIGH (ref 70–99)
Glucose-Capillary: 120 mg/dL — ABNORMAL HIGH (ref 70–99)
Glucose-Capillary: 139 mg/dL — ABNORMAL HIGH (ref 70–99)
Glucose-Capillary: 116 mg/dL — ABNORMAL HIGH (ref 70–99)

## 2019-11-19 LAB — PULMONARY FUNCTION TEST
FEF 25-75 Pre: 0.57 L/sec
FEF2575-%Pred-Pre: 35 %
FEV1-%Pred-Pre: 49 %
FEV1-Pre: 1.07 L
FEV1FVC-%Pred-Pre: 89 %
FEV6-%Pred-Pre: 56 %
FEV6-Pre: 1.59 L
FEV6FVC-%Pred-Pre: 106 %
FVC-%Pred-Pre: 52 %
FVC-Pre: 1.6 L
Pre FEV1/FVC ratio: 67 %
Pre FEV6/FVC Ratio: 99 %

## 2019-11-19 LAB — CBC
HCT: 28.5 % — ABNORMAL LOW (ref 39.0–52.0)
Hemoglobin: 9.4 g/dL — ABNORMAL LOW (ref 13.0–17.0)
MCH: 31.8 pg (ref 26.0–34.0)
MCHC: 33 g/dL (ref 30.0–36.0)
MCV: 96.3 fL (ref 80.0–100.0)
Platelets: 353 10*3/uL (ref 150–400)
RBC: 2.96 MIL/uL — ABNORMAL LOW (ref 4.22–5.81)
RDW: 19.6 % — ABNORMAL HIGH (ref 11.5–15.5)
WBC: 6.7 10*3/uL (ref 4.0–10.5)
nRBC: 0.6 % — ABNORMAL HIGH (ref 0.0–0.2)

## 2019-11-19 LAB — BASIC METABOLIC PANEL
Anion gap: 8 (ref 5–15)
BUN: 17 mg/dL (ref 8–23)
CO2: 23 mmol/L (ref 22–32)
Calcium: 8.1 mg/dL — ABNORMAL LOW (ref 8.9–10.3)
Chloride: 101 mmol/L (ref 98–111)
Creatinine, Ser: 1.07 mg/dL (ref 0.61–1.24)
GFR calc Af Amer: >60 mL/min (ref >60)
GFR calc non Af Amer: >60 mL/min (ref >60)
Glucose, Bld: 133 mg/dL — ABNORMAL HIGH (ref 70–99)
Potassium: 4.2 mmol/L (ref 3.5–5.1)
Sodium: 132 mmol/L — ABNORMAL LOW (ref 135–145)

## 2019-11-19 LAB — PLATELET INHIBITION P2Y12: Platelet Function  P2Y12: 187 [PRU] (ref 182–335)

## 2019-11-19 LAB — ECHOCARDIOGRAM COMPLETE
Height: 66 in
Weight: 2620.83 oz

## 2019-11-19 LAB — HEPARIN LEVEL (UNFRACTIONATED): Heparin Unfractionated: 0.32 IU/mL (ref 0.30–0.70)

## 2019-11-19 MED ORDER — CHLORHEXIDINE GLUCONATE CLOTH 2 % EX PADS
6.0000 | MEDICATED_PAD | Freq: Once | CUTANEOUS | Status: AC
  Administered 2019-11-19: 6 via TOPICAL

## 2019-11-19 MED ORDER — MILRINONE LACTATE IN DEXTROSE 20-5 MG/100ML-% IV SOLN
0.3000 ug/kg/min | INTRAVENOUS | Status: AC
  Administered 2019-11-20: .3 ug/kg/min via INTRAVENOUS
  Filled 2019-11-19: qty 100

## 2019-11-19 MED ORDER — VANCOMYCIN HCL 1250 MG/250ML IV SOLN
1250.0000 mg | INTRAVENOUS | Status: DC
  Filled 2019-11-19 (×2): qty 250

## 2019-11-19 MED ORDER — MAGNESIUM SULFATE 50 % IJ SOLN
40.0000 meq | INTRAMUSCULAR | Status: DC
  Filled 2019-11-19: qty 9.85

## 2019-11-19 MED ORDER — PERFLUTREN LIPID MICROSPHERE
1.0000 mL | INTRAVENOUS | Status: AC | PRN
  Administered 2019-11-19: 2 mL via INTRAVENOUS
  Filled 2019-11-19: qty 10

## 2019-11-19 MED ORDER — TEMAZEPAM 15 MG PO CAPS
15.0000 mg | ORAL_CAPSULE | Freq: Once | ORAL | Status: AC | PRN
  Administered 2019-11-20: 15 mg via ORAL
  Filled 2019-11-19: qty 1

## 2019-11-19 MED ORDER — BISACODYL 5 MG PO TBEC: 5.0000 mg | DELAYED_RELEASE_TABLET | Freq: Once | ORAL | Status: DC

## 2019-11-19 MED ORDER — NOREPINEPHRINE 4 MG/250ML-% IV SOLN
0.0000 ug/min | INTRAVENOUS | Status: AC
  Administered 2019-11-20: 4 ug/min via INTRAVENOUS
  Filled 2019-11-19: qty 250

## 2019-11-19 MED ORDER — SODIUM CHLORIDE 0.9 % IV SOLN
1.5000 g | INTRAVENOUS | Status: DC
  Filled 2019-11-19 (×2): qty 1.5

## 2019-11-19 MED ORDER — TRANEXAMIC ACID (OHS) PUMP PRIME SOLUTION
2.0000 mg/kg | INTRAVENOUS | Status: DC
  Filled 2019-11-19: qty 1.49

## 2019-11-19 MED ORDER — POTASSIUM CHLORIDE 2 MEQ/ML IV SOLN
80.0000 meq | INTRAVENOUS | Status: DC
  Filled 2019-11-19: qty 40

## 2019-11-19 MED ORDER — ENSURE ENLIVE PO LIQD
237.0000 mL | Freq: Two times a day (BID) | ORAL | Status: DC
  Administered 2019-11-19 – 2019-11-25 (×3): 237 mL via ORAL

## 2019-11-19 MED ORDER — INSULIN REGULAR(HUMAN) IN NACL 100-0.9 UT/100ML-% IV SOLN
INTRAVENOUS | Status: AC
  Administered 2019-11-20: 1.1 [IU]/h via INTRAVENOUS
  Filled 2019-11-19: qty 100

## 2019-11-19 MED ORDER — TRANEXAMIC ACID (OHS) BOLUS VIA INFUSION
15.0000 mg/kg | INTRAVENOUS | Status: AC
  Administered 2019-11-20: 1114.5 mg via INTRAVENOUS
  Filled 2019-11-19: qty 1115

## 2019-11-19 MED ORDER — SODIUM CHLORIDE 0.9 % IV SOLN
INTRAVENOUS | Status: DC
  Filled 2019-11-19: qty 30

## 2019-11-19 MED ORDER — SODIUM CHLORIDE 0.9 % IV SOLN
750.0000 mg | INTRAVENOUS | Status: DC
  Filled 2019-11-19 (×2): qty 750

## 2019-11-19 MED ORDER — PHENYLEPHRINE HCL-NACL 20-0.9 MG/250ML-% IV SOLN
30.0000 ug/min | INTRAVENOUS | Status: DC
  Filled 2019-11-19: qty 250

## 2019-11-19 MED ORDER — TRANEXAMIC ACID 1000 MG/10ML IV SOLN
1.5000 mg/kg/h | INTRAVENOUS | Status: AC
  Administered 2019-11-20: 1.5 mg/kg/h via INTRAVENOUS
  Filled 2019-11-19: qty 25

## 2019-11-19 MED ORDER — METOPROLOL TARTRATE 12.5 MG HALF TABLET
12.5000 mg | ORAL_TABLET | Freq: Once | ORAL | Status: AC
  Administered 2019-11-20: 12.5 mg via ORAL
  Filled 2019-11-19: qty 1

## 2019-11-19 MED ORDER — DEXMEDETOMIDINE HCL IN NACL 400 MCG/100ML IV SOLN
0.1000 ug/kg/h | INTRAVENOUS | Status: AC
  Administered 2019-11-20: .2 ug/kg/h via INTRAVENOUS
  Filled 2019-11-19: qty 100

## 2019-11-19 MED ORDER — PLASMA-LYTE 148 IV SOLN
INTRAVENOUS | Status: AC
  Administered 2019-11-20: 500 mL
  Filled 2019-11-19: qty 2.5

## 2019-11-19 MED ORDER — CHLORHEXIDINE GLUCONATE 0.12 % MT SOLN
15.0000 mL | Freq: Once | OROMUCOSAL | Status: AC
  Administered 2019-11-20: 15 mL via OROMUCOSAL
  Filled 2019-11-19: qty 15

## 2019-11-19 MED ORDER — NITROGLYCERIN IN D5W 200-5 MCG/ML-% IV SOLN
2.0000 ug/min | INTRAVENOUS | Status: DC
  Filled 2019-11-19: qty 250

## 2019-11-19 MED ORDER — EPINEPHRINE HCL 5 MG/250ML IV SOLN IN NS
0.0000 ug/min | INTRAVENOUS | Status: AC
  Administered 2019-11-20: 2 ug/min via INTRAVENOUS
  Filled 2019-11-19: qty 250

## 2019-11-19 NOTE — Progress Notes (Signed)
Pre-CABG testing has been completed. Preliminary results can be found in CV Proc through chart review.   11/19/19 4:49 PM Olen Cordial RVT

## 2019-11-19 NOTE — Progress Notes (Signed)
Assisted tele visit to patient with family member.  Aubreyana Saltz R, RN  

## 2019-11-19 NOTE — Progress Notes (Signed)
Initial Nutrition Assessment  DOCUMENTATION CODES:   Not applicable  INTERVENTION:   Ensure Enlive po BID, each supplement provides 350 kcal and 20 grams of protein  Magic cup BID with meals, each supplement provides 290 kcal and 9 grams of protein  Recommend downgrading diet to Dysphagia 3   NUTRITION DIAGNOSIS:   Inadequate oral intake related to poor appetite, acute illness as evidenced by per patient/family report.  GOAL:   Patient will meet greater than or equal to 90% of their needs  MONITOR:   PO intake, Supplement acceptance, Labs, Weight trends  REASON FOR ASSESSMENT:   Malnutrition Screening Tool    ASSESSMENT:   84 yo admitted with NSTEMI with plans for CABG. PMH includes essential HTN, HLD, CVA   Pt reports appetite is fair; does not like the hospital food. Recorded po intake 25-50% of meals. Pt eating ice cream on visit today . Pt does complain that the food is too tough. Noted chopped meats comment in diet order; plan to downgrade diet to Dysphagia 3  Pt denies any weight loss; no weight loss per weight encounters.   Labs: sodium 132 (L) Meds: reviewed   Diet Order:   Diet Order            Diet Heart Room service appropriate? Yes with Assist; Fluid consistency: Thin  Diet effective now              EDUCATION NEEDS:   Education needs have been addressed  Skin:  Skin Assessment: Skin Integrity Issues: Skin Integrity Issues:: Stage I Stage I: sacrum  Last BM:  4/9  Height:   Ht Readings from Last 1 Encounters:  11/17/19 5\' 6"  (1.676 m)    Weight:   Wt Readings from Last 1 Encounters:  11/18/19 74.3 kg    BMI:  Body mass index is 26.44 kg/m.  Estimated Nutritional Needs:   Kcal:  1700-1900 kcals  Protein:  85-95 g  Fluid:  >/= 1.7 L   11/20/19 MS, RDN, LDN, CNSC RD Pager Number and Weekend/On-Call After Hours Pager Located in [place]

## 2019-11-19 NOTE — Plan of Care (Signed)

## 2019-11-19 NOTE — Consult Note (Signed)
[address]       [place] [address]             [phone]        [name] [facility] Medical Record #[id number] Date of Birth: May 13, 1936  Referring: No ref. provider found Primary Care: Patient, No Pcp Per Primary Cardiologist:Henry Jamey Reas, MD  Chief Complaint:    Chief Complaint  Patient presents with  . Chest Pain    History of Present Illness:      84 yo man who is independent and lives alone suffered mechanical fall 2 weeks ago was evaluated and discharged to home with [facility]. He returned a few days later with new-onset SSCP, originally thought related to chronic reflux. Evaluation showed elevated troponin and reduced LV function, whereas function was normal during prior evaluation. He was taken to the cath lab where severe LM CAD was diagnosed. His pain subsequently resolved. Consult received for consideration of CABG.   Current Activity/ Functional Status: Patient will be independent with mobility/ambulation, transfers, ADL's, IADL's.   Zubrod Score: At the time of surgery this patient's most appropriate activity status/level should be described as: []     0    Normal activity, no symptoms [x]     1    Restricted in physical strenuous activity but ambulatory, able to do out light work []     2    Ambulatory and capable of self care, unable to do work activities, up and about                 more than 50%  Of the time                            []     3    Only limited self care, in bed greater than 50% of waking hours []     4    Completely disabled, no self care, confined to bed or chair []     5    Moribund  Past Medical History:  Diagnosis Date  . Arthritis   . Bundle branch block   . Hypercholesteremia   . Hyperlipidemia 2001  . Hypertension 2001  . Hypertension   . Peripheral vascular disease (HCC)   . Stroke (HCC)   . Stroke [facility])    2001 - R side deficits    Past Surgical History:  Procedure Laterality Date  . ABDOMINAL  AORTOGRAM N/A 11/18/2019   Procedure: ABDOMINAL AORTOGRAM;  Surgeon: , MD;  Location: [facility] INVASIVE CV LAB;  Service: Cardiovascular;  Laterality: N/A;  . LEFT HEART CATH AND CORONARY ANGIOGRAPHY N/A 11/18/2019   Procedure: LEFT HEART CATH AND CORONARY ANGIOGRAPHY;  Surgeon: [facility], MD;  Location: [facility] INVASIVE CV LAB;  Service: Cardiovascular;  Laterality: N/A;    Social History   Tobacco Use  Smoking Status Never Smoker  Smokeless Tobacco Never Used    Social History   Substance and Sexual Activity  Alcohol Use Never     No Known Allergies  Current Facility-Administered Medications  Medication Dose Route Frequency Provider Last Rate Last Admin  . 0.9 %  sodium chloride infusion  250 mL Intravenous PRN 11/20/2019 S, MD      . 0.9 %  sodium chloride infusion  250 mL Intravenous PRN Corky Crafts, MD      . acetaminophen (TYLENOL) tablet 650 mg  650 mg Oral Q4H  PRN Corky Crafts, MD   650 mg at 11/19/19 1003  . acetaminophen (TYLENOL) tablet 650 mg  650 mg Oral Q4H PRN Corky Crafts, MD      . ALPRAZolam Prudy Feeler) tablet 0.25 mg  0.25 mg Oral BID PRN Corky Crafts, MD      . alum & mag hydroxide-simeth (MAALOX/MYLANTA) 200-200-20 MG/5ML suspension 30 mL  30 mL Oral Q2H PRN Corky Crafts, MD   30 mL at 11/17/19 2318  . aspirin EC tablet 81 mg  81 mg Oral Daily Corky Crafts, MD   81 mg at 11/19/19 1001  . bisacodyl (DULCOLAX) EC tablet 5 mg  5 mg Oral Once Linden Dolin, MD      . Melene Muller ON 11/20/2019] cefUROXime (ZINACEF) 1.5 g in sodium chloride 0.9 % 100 mL IVPB  1.5 g Intravenous To OR Mosetta Anis, RPH      . [START ON 11/20/2019] cefUROXime (ZINACEF) 750 mg in sodium chloride 0.9 % 100 mL IVPB  750 mg Intravenous To OR Mosetta Anis, RPH      . [START ON 11/20/2019] chlorhexidine (PERIDEX) 0.12 % solution 15 mL  15 mL Mouth/Throat Once Soraiya Ahner, Merri Brunette, MD      . Chlorhexidine Gluconate  Cloth 2 % PADS 6 each  6 each Topical Daily Corky Crafts, MD   6 each at 11/19/19 1100  . Chlorhexidine Gluconate Cloth 2 % PADS 6 each  6 each Topical Once Linden Dolin, MD      . Melene Muller ON 11/20/2019] dexmedetomidine (PRECEDEX) 400 MCG/100ML (4 mcg/mL) infusion  0.1-0.7 mcg/kg/hr Intravenous To OR Mosetta Anis, RPH      . [START ON 11/20/2019] EPINEPHrine (ADRENALIN) 4 mg in NS 250 mL (0.016 mg/mL) premix infusion  0-10 mcg/min Intravenous To OR Mosetta Anis, RPH      . feeding supplement (ENSURE ENLIVE) (ENSURE ENLIVE) liquid 237 mL  237 mL Oral BID BM Runell Gess, MD   237 mL at 11/19/19 1751  . [START ON 11/20/2019] heparin 30,000 units/NS 1000 mL solution for CELLSAVER   Other To OR Mosetta Anis, [facility]      . heparin ADULT infusion 100 units/mL (25000 units/2102mL sodium chloride 0.45%)  800 Units/hr Intravenous Continuous Rosalyn Gess, RPH 8 mL/hr at 11/19/19 1800 800 Units/hr at 11/19/19 1800  . [START ON 11/20/2019] heparin sodium (porcine) 2,500 Units, papaverine 30 mg in electrolyte-148 (PLASMALYTE-148) 500 mL irrigation   Irrigation To OR Mosetta Anis, RPH      . [START ON 11/20/2019] insulin regular, human (MYXREDLIN) 100 units/ 100 mL infusion   Intravenous To OR Mosetta Anis, RPH      . levalbuterol [facility]) nebulizer solution 0.63 mg  0.63 mg Nebulization Once Corky Crafts, MD      . magnesium hydroxide (MILK OF MAGNESIA) suspension 30 mL  30 mL Oral Daily PRN Corky Crafts, MD   30 mL at 11/19/19 1010  . [START ON 11/20/2019] magnesium sulfate (IV Push/IM) injection 40 mEq  40 mEq Other To OR Mosetta Anis, RPH      . metoprolol tartrate (LOPRESSOR) tablet 12.5 mg  12.5 mg Oral BID Corky Crafts, MD   12.5 mg at 11/18/19 0919  . [START ON 11/20/2019] metoprolol tartrate (LOPRESSOR) tablet 12.5 mg  12.5 mg Oral Once Linden Dolin, MD      . Melene Muller ON 11/20/2019] milrinone (PRIMACOR) 20 MG/100 ML (0.2 mg/mL)  infusion  0.3 mcg/kg/min Intravenous To OR Mosetta Anis, RPH      . nitroGLYCERIN (NITROSTAT) SL tablet 0.4 mg  0.4 mg Sublingual Q5 Min x 3 PRN Lance Muss S, MD      . nitroGLYCERIN 50 mg in dextrose 5 % 250 mL (0.2 mg/mL) infusion  0-200 mcg/min Intravenous Titrated Corky Crafts, MD   Stopped at 11/18/19 1223  . [START ON 11/20/2019] nitroGLYCERIN 50 mg in dextrose 5 % 250 mL (0.2 mg/mL) infusion  2-200 mcg/min Intravenous To OR Mosetta Anis, RPH      . [START ON 11/20/2019] norepinephrine (LEVOPHED) 4mg  in premix infusion  0-40 mcg/min Intravenous To OR , RPH      . ondansetron [facility]) injection 4 mg  4 mg Intravenous Q6H PRN [facility], MD   4 mg at 11/17/19 2134  . ondansetron (ZOFRAN) injection 4 mg  4 mg Intravenous Q6H PRN 2135, MD      . pantoprazole (PROTONIX) EC tablet 40 mg  40 mg Oral Q1200 Corky Crafts, MD   40 mg at 11/19/19 1001  . [START ON 11/20/2019] phenylephrine (NEOSYNEPHRINE) 20-0.9 MG/250ML-% infusion  30-200 mcg/min Intravenous To OR 11/22/2019, RPH      . [START ON 11/20/2019] potassium chloride injection 80 mEq  80 mEq Other To OR 11/22/2019, [facility]      . rosuvastatin (CRESTOR) tablet 10 mg  10 mg Oral Daily [facility], MD   10 mg at 11/19/19 1001  . sodium chloride flush (NS) 0.9 % injection 3 mL  3 mL Intravenous Once 11/21/19 S, MD      . sodium chloride flush (NS) 0.9 % injection 3 mL  3 mL Intravenous Q12H Lance Muss S, MD      . sodium chloride flush (NS) 0.9 % injection 3 mL  3 mL Intravenous PRN Lance Muss, MD      . sodium chloride flush (NS) 0.9 % injection 3 mL  3 mL Intravenous Q12H Corky Crafts S, MD      . sodium chloride flush (NS) 0.9 % injection 3 mL  3 mL Intravenous Q12H Lance Muss, MD   3 mL at 11/19/19 11/21/19  . sodium chloride flush (NS) 0.9 % injection 3 mL  3 mL Intravenous PRN 9528, MD       . temazepam (RESTORIL) capsule 15 mg  15 mg Oral Once PRN Corky Crafts, MD      . Linden Dolin ON 11/20/2019] tranexamic acid (CYKLOKAPRON) 2,500 mg in sodium chloride 0.9 % 250 mL (10 mg/mL) infusion  1.5 mg/kg/hr Intravenous To OR 11/22/2019, RPH      . [START ON 11/20/2019] tranexamic acid (CYKLOKAPRON) bolus via infusion - over 30 minutes 1,114.5 mg  15 mg/kg Intravenous To OR 11/22/2019, RPH      . [START ON 11/20/2019] tranexamic acid (CYKLOKAPRON) pump prime solution 149 mg  2 mg/kg Intracatheter To OR 11/22/2019, RPH      . [START ON 11/20/2019] vancomycin (VANCOREADY) IVPB 1250 mg/250 mL  1,250 mg Intravenous To OR 11/22/2019, RPH      . zolpidem (AMBIEN) tablet 5 mg  5 mg Oral QHS PRN Mosetta Anis, MD        Medications Prior to Admission  Medication Sig Dispense Refill Last Dose  . aspirin EC 81 MG tablet Take 81 mg by mouth daily.  11/17/2019 at Unknown time  . cetirizine (ZYRTEC) 10 MG tablet Take 10 mg by mouth as needed for allergies.   unknown  . Cholecalciferol (VITAMIN D) 50 MCG (2000 UT) tablet Take 2,000 Units by mouth daily.   11/17/2019 at Unknown time  . clopidogrel (PLAVIX) 75 MG tablet TAKE 1 TABLET BY MOUTH DAILY (Patient taking differently: Take 75 mg by mouth daily. ) 90 tablet 1 11/17/2019 at Unknown time  . diltiazem (CARDIZEM CD) 180 MG 24 hr capsule TAKE ONE CAPSULE BY MOUTH EVERY DAY (Patient taking differently: Take 180 mg by mouth daily. ) 90 capsule 0 11/17/2019 at Unknown time  . lisinopril (ZESTRIL) 10 MG tablet TAKE 1 TABLET BY MOUTH DAILY (Patient taking differently: Take 10 mg by mouth daily. ) 90 tablet 1 11/17/2019 at Unknown time  . polyethylene glycol (MIRALAX / GLYCOLAX) 17 g packet Take 17 g by mouth daily. (Patient taking differently: Take 17 g by mouth daily as needed for mild constipation. ) 14 each 0 unknown  . rosuvastatin (CRESTOR) 10 MG tablet TAKE 1 TABLET BY MOUTH EVERY DAY (Patient taking differently: Take 10  mg by mouth daily. ) 90 tablet 1 11/16/2019 at Unknown time  . VITAMIN D PO Take 1 tablet by mouth daily.   11/17/2019 at Unknown time  . zinc gluconate 50 MG tablet Take 50 mg by mouth daily.   11/17/2019 at Unknown time  . diclofenac sodium (VOLTAREN) 1 % GEL Apply 2 g topically 4 (four) times daily. Rub into affected area of foot 2 to 4 times daily (Patient not taking: Reported on 11/17/2019) 100 g 2 Not Taking at Unknown time  . hydrochlorothiazide (HYDRODIURIL) 12.5 MG tablet TAKE 1 TABLET BY MOUTH ONCE DAILY (Patient not taking: No sig reported) 90 tablet 1 Not Taking at Unknown time  . lisinopril-hydrochlorothiazide (ZESTORETIC) 10-12.5 MG tablet Take 1 tablet by mouth daily. (Patient not taking: Reported on 11/17/2019) 90 tablet 1 Not Taking at Unknown time    Family History  Problem Relation Age of Onset  . Stroke Mother   . Stroke Father   . Heart attack Father   . Cancer Brother      Review of Systems:   ROS A comprehensive review of systems was negative except for: Constitutional: positive for malaise Ears, nose, mouth, throat, and face: positive for speech impairment s/p CVA Hematologic/lymphatic: positive for easy bruising Musculoskeletal: positive for right UE stiffness, s/p fall Neurological: positive for coordination problems and s/p CVA remotely     Cardiac Review of Systems: Y or  [    ]= no  Chest Pain [    ]  Resting SOB [   ] Exertional SOB  [  ]  Orthopnea [  ]   Pedal Edema [   ]    Palpitations [  ] Syncope  [  ]   Presyncope [   ]  General Review of Systems: [Y] = yes [  ]=no Constitional: recent weight change [  ]; anorexia [  ]; fatigue [  ]; nausea [  ]; night sweats [  ]; fever [  ]; or chills [  ]  Dental: Last Dentist visit:   Eye : blurred vision [  ]; diplopia [   ]; vision changes [  ];  Amaurosis fugax[  ]; Resp: cough [  ];  wheezing[  ];  hemoptysis[  ]; shortness of breath[  ]; paroxysmal  nocturnal dyspnea[  ]; dyspnea on exertion[  ]; or orthopnea[  ];  GI:  gallstones[  ], vomiting[  ];  dysphagia[  ]; melena[  ];  hematochezia [  ]; heartburn[  ];   Hx of  Colonoscopy[  ]; GU: kidney stones [  ]; hematuria[  ];   dysuria [  ];  nocturia[  ];  history of     obstruction [  ]; urinary frequency [  ]             Skin: rash, swelling[  ];, hair loss[  ];  peripheral edema[  ];  or itching[  ]; Musculosketetal: myalgias[  ];  joint swelling[  ];  joint erythema[  ];  joint pain[  ];  back pain[  ];  Heme/Lymph: bruising[  ];  bleeding[  ];  anemia[  ];  Neuro: TIA[  ];  headaches[  ];  stroke[  ];  vertigo[  ];  seizures[  ];   paresthesias[  ];  difficulty walking[  ];  Psych:depression[  ]; anxiety[  ];  Endocrine: diabetes[  ];  thyroid dysfunction[  ];            Physical Exam: BP 92/70 (BP Location: Left Arm)   Pulse 95   Temp 98.3 F (36.8 C) (Oral)   Resp (!) 31   Ht 5\' 6"  (1.676 m)   Wt 74.3 kg   SpO2 99%   BMI 26.44 kg/m    General appearance: alert and cooperative Head: Normocephalic, without obvious abnormality, atraumatic Neck: no adenopathy, no carotid bruit, no JVD, supple, symmetrical, trachea midline and thyroid not enlarged, symmetric, no tenderness/mass/nodules Resp: clear to auscultation bilaterally Cardio: regular rate and rhythm, S1, S2 normal, no murmur, click, rub or gallop GI: soft, non-tender; bowel sounds normal; no masses,  no organomegaly Extremities: no edema Neurologic: Mental status: Alert, oriented, thought content appropriate Motor: grossly normal Coordination: normal  Diagnostic Studies & Laboratory data:     Recent Radiology Findings:   CARDIAC CATHETERIZATION  Result Date: 11/18/2019  Ost RCA to Prox RCA lesion is 70% stenosed.  Mid RCA lesion is 75% stenosed.  Ost Cx to Prox Cx lesion is 100% stenosed.  Ost LM to Mid LM lesion is 75% stenosed.  Dist LM to Prox LAD lesion is 90% stenosed.  Mid LAD lesion is 25%  stenosed.  There is moderate left ventricular systolic dysfunction.  LV end diastolic pressure is moderately elevated.  The left ventricular ejection fraction is 35-45% by visual estimate.  There is no aortic valve stenosis.  Severe three vessel disease including heavily calcified left main and proximal LAD.  Aortic atherosclerosis with no AAA.  Occluded right common iliac artery. 50% lesion in the left external artery. Discussed with Dr. 11/20/2019.  Will obtain surgical consult for possible LIMA to LAD.  IV Lasix ordered.  Restart heparin 8 hours post sheath pull.   PERIPHERAL VASCULAR CATHETERIZATION  Result Date: 11/18/2019  Ost RCA to Prox RCA lesion is 70% stenosed.  Mid RCA lesion is 75% stenosed.  Ost Cx to Prox Cx lesion is 100% stenosed.  Ost LM to Mid LM lesion is 75% stenosed.  Dist LM to Prox LAD  lesion is 90% stenosed.  Mid LAD lesion is 25% stenosed.  There is moderate left ventricular systolic dysfunction.  LV end diastolic pressure is moderately elevated.  The left ventricular ejection fraction is 35-45% by visual estimate.  There is no aortic valve stenosis.  Severe three vessel disease including heavily calcified left main and proximal LAD.  Aortic atherosclerosis with no AAA.  Occluded right common iliac artery. 50% lesion in the left external artery. Discussed with Dr. Allyson Sabal.  Will obtain surgical consult for possible LIMA to LAD.  IV Lasix ordered.  Restart heparin 8 hours post sheath pull.   VAS US DOPPLER PRE CABG  Result Date: 11/19/2019 PREOPERATIVE VASCULAR EVALUATION  Indications:      Pre-CABG. Risk Factors:     Hypertension, prior CVA, PAD. Limitations:      Patient positioning, respiratory variation, vessel movement,                   vessel calcification, right arm immobile with brace and                   bandages Comparison Study: 02/20/2019 - ABI's R 0.44 L 0.94 Performing Technologist: Olen Cordial RVT  Examination Guidelines: A complete evaluation includes B-mode  imaging, spectral Doppler, color Doppler, and power Doppler as needed of all accessible portions of each vessel. Bilateral testing is considered an integral part of a complete examination. Limited examinations for reoccurring indications may be performed as noted.  Right Carotid Findings: +----------+--------+--------+--------+-----------------------+--------+           PSV cm/sEDV cm/sStenosisDescribe               Comments +----------+--------+--------+--------+-----------------------+--------+ CCA Prox  67      12              smooth and heterogenoustortuous +----------+--------+--------+--------+-----------------------+--------+ CCA Distal71      17              calcific                        +----------+--------+--------+--------+-----------------------+--------+ ICA Prox  62      25              calcific                        +----------+--------+--------+--------+-----------------------+--------+ ICA Distal56      22                                     tortuous +----------+--------+--------+--------+-----------------------+--------+ ECA       62      13                                              +----------+--------+--------+--------+-----------------------+--------+ Portions of this table do not appear on this page. +----------+--------+-------+--------+------------+           PSV cm/sEDV cmsDescribeArm Pressure +----------+--------+-------+--------+------------+ Subclavian76                                  +----------+--------+-------+--------+------------+ +---------+--------+--+--------+--+---------+ VertebralPSV cm/s73EDV cm/s27Antegrade +---------+--------+--+--------+--+---------+ Left Carotid Findings: +----------+--------+--------+--------+--------------------------+--------+           PSV cm/sEDV cm/sStenosisDescribe  Comments +----------+--------+--------+--------+--------------------------+--------+ CCA Prox   103     30              irregular and heterogenous         +----------+--------+--------+--------+--------------------------+--------+ CCA Distal58      25              irregular and heterogenous         +----------+--------+--------+--------+--------------------------+--------+ ICA Prox  65      22              calcific                           +----------+--------+--------+--------+--------------------------+--------+ ICA Distal56      25                                                 +----------+--------+--------+--------+--------------------------+--------+ ECA       35                                                         +----------+--------+--------+--------+--------------------------+--------+ +----------+--------+--------+--------+------------+ SubclavianPSV cm/sEDV cm/sDescribeArm Pressure +----------+--------+--------+--------+------------+           75                      92           +----------+--------+--------+--------+------------+ +---------+--------+--+--------+--+---------+ VertebralPSV cm/s38EDV cm/s14Antegrade +---------+--------+--+--------+--+---------+  ABI Findings: +-----+------------------+-----+----------+--------+ RightRt Pressure (mmHg)IndexWaveform  Comment  +-----+------------------+-----+----------+--------+ PTA  61                0.66 monophasic         +-----+------------------+-----+----------+--------+ DP   56                0.61 monophasic         +-----+------------------+-----+----------+--------+ +--------+------------------+-----+---------+-------+ Left    Lt Pressure (mmHg)IndexWaveform Comment +--------+------------------+-----+---------+-------+ [id number]                     triphasic        +--------+------------------+-----+---------+-------+ PTA     99                1.08 biphasic         +--------+------------------+-----+---------+-------+ DP      96                1.04  biphasic         +--------+------------------+-----+---------+-------+ +-------+---------------+----------------+ ABI/TBIToday's ABI/TBIPrevious ABI/TBI +-------+---------------+----------------+ Right  0.66           0.44             +-------+---------------+----------------+ Left   1.08           0.94             +-------+---------------+----------------+  Left Doppler Findings: +--------+--------+-----+---------+--------+ Site    PressureIndexDoppler  Comments +--------+--------+-----+---------+--------+ [id number]           triphasic         +--------+--------+-----+---------+--------+ Radial               triphasic         +--------+--------+-----+---------+--------+ Ulnar  triphasic         +--------+--------+-----+---------+--------+ Technologist Notes: Unable to insonate due to patient immobility, positioning, bandages, and arm brace.  Summary: Right Carotid: Velocities in the right ICA are consistent with a 1-39% stenosis. Left Carotid: Velocities in the left ICA are consistent with a 1-39% stenosis. Vertebrals: Bilateral vertebral arteries demonstrate antegrade flow. Right ABI: Resting right ankle-brachial index indicates moderate right lower extremity arterial disease. Left ABI: Resting left ankle-brachial index is within normal range. No evidence of significant left lower extremity arterial disease. Left Upper Extremity: Doppler waveform obliterate with left radial compression. Doppler waveform obliterate with left ulnar compression.     Preliminary      I have independently reviewed the above radiologic studies and discussed with the patient   Recent Lab Findings: Lab Results  Component Value Date   WBC 6.7 11/19/2019   HGB 9.4 (L) 11/19/2019   HCT 28.5 (L) 11/19/2019   PLT 353 11/19/2019   GLUCOSE 133 (H) 11/19/2019   CHOL 108 11/18/2019   TRIG 44 11/18/2019   HDL 43 11/18/2019   LDLCALC 56 11/18/2019   ALT 41 11/03/2019   AST 37  11/03/2019   NA 132 (L) 11/19/2019   K 4.2 11/19/2019   CL 101 11/19/2019   CREATININE 1.07 11/19/2019   BUN 17 11/19/2019   CO2 23 11/19/2019   TSH 2.571 11/17/2019   INR 1.1 11/03/2019   HGBA1C 6.0 (H) 06/30/2019      Assessment / Plan:      84 yo man with severe LM disease and occluded right iliac artery although his ABI on the ipsalateral side does not suggest limb threatening ischemia at rest. Among the 3 standard options for treating his CAD, medical therapy would be associated with high mortality; attempt at PCI is complicated by severe PVD and would likely need Impella support. CABG is the most definitive therapy, and he appears to be an acceptable candidate without identifiable prohibitive risk. I have discussed these options extensively with his guardian, Weldon Picking. On behalf of the other family members, she requests surgery to be performed. The patient himself verbalizes understanding and wishes to proceed. Plan CABG on 11/20/19.     I  spent 40 minutes counseling the patient face to face.   Chatham Howington Z. Vickey Sages, MD [phone] 11/19/2019 9:29 PM

## 2019-11-19 NOTE — Chronic Care Management (AMB) (Signed)
  Chronic Care Management   Note  11/19/2019 Name: ENOS MUHL MRN: [id number] DOB: July 19, 1936  Central Pharmacy referral made for assistance with medication management.   Follow up plan: The [facility] team will follow up with the patient and will provide direct communication to the PCP for this patient.   Marja Kays MHA,BSN,RN,CCM [facility]  [facility] Care Management Coordinator Direct Dial:  [phone]  Fax: [phone]

## 2019-11-19 NOTE — Progress Notes (Signed)
  Echocardiogram 2D Echocardiogram has been performed.  [name] [name] 11/19/2019, 9:33 AM

## 2019-11-19 NOTE — Progress Notes (Signed)
ANTICOAGULATION CONSULT NOTE   Pharmacy Consult for Heparin Indication: chest pain/ACS  No Known Allergies  Patient Measurements: Height: 5\' 6"  (167.6 cm) Weight: 74.3 kg (163 lb 12.8 oz) IBW/kg (Calculated) : 63.8 Heparin Dosing Weight: TBW  Vital Signs: Temp: 98.1 F (36.7 C) (04/14 2300) Temp Source: Oral (04/14 2300) BP: 88/55 (04/15 0200) Pulse Rate: 80 (04/15 0200)  Labs: Recent Labs    11/17/19 1324 11/17/19 1324 11/17/19 1523 11/17/19 1523 11/17/19 1822 11/17/19 2110 11/17/19 2322 11/18/19 0042 11/18/19 0042 11/18/19 0222 11/19/19 0240  HGB 10.3*   < > 9.6*   < >  --   --   --  9.2*   < > 8.9* 9.4*  HCT 31.0*   < > 28.9*   < >  --   --   --  27.5*  --  26.6* 28.5*  PLT 399   < > 409*   < >  --   --   --  390  --  365 353  HEPARINUNFRC  --   --   --   --   --   --  0.52  --   --  0.38 0.32  CREATININE 1.05  --   --   --   --   --   --   --   --  0.98  --   TROPONINIHS 1,867*   < > 1,824*  --  1,630* 1,794*  --   --   --   --   --    < > = values in this interval not displayed.    Estimated Creatinine Clearance: 52.4 mL/min (by C-G formula based on SCr of 0.98 mg/dL).   Assessment: 60 YOM presenting with chest pain, not on anticoagulation PTA. Patient went to cath lab 4/14; significant disease burden was found. Surgery consult was placed for possible CABG. Heparin restarted post cath.  Heparin level therapeutic (0.32) on gtt at 800 units/hr. No bleeding noted.  Goal of Therapy:  Heparin level 0.3-0.7 units/ml Monitor platelets by anticoagulation protocol: Yes   Plan:  Continue heparin at 800 units/hr Will f/u daily heparin level  5/14, PharmD, BCPS Please see amion for complete clinical pharmacist phone list 11/19/2019 3:17 AM

## 2019-11-19 NOTE — Plan of Care (Signed)
Overnight Shift Note:  No c/o CP or GI distress this evening.  Had heart cath 4/14, found to have 3VD. L femoral site level 0, no complications. Still on heparin gtt.  Seems to want the surgery, but emphasizes that his goals are to be able to maintain his current level of activity and independence, and "not just sit and do nothing". He also said he'd rather be in a nursing home if he wasn't able to be independent so as to not put undue burden on his family and friends.   Ultimately, he said he wants to discuss these things with his niece Melina Schools before making a decision.    Problem: Education: Goal: Knowledge of General Education information will improve Description: Including pain rating scale, medication(s)/side effects and non-pharmacologic comfort measures Outcome: Progressing   Problem: Health Behavior/Discharge Planning: Goal: Ability to manage health-related needs will improve Outcome: Progressing   Problem: Clinical Measurements: Goal: Ability to maintain clinical measurements within normal limits will improve Outcome: Progressing Goal: Will remain free from infection Outcome: Progressing Goal: Diagnostic test results will improve Outcome: Progressing Goal: Respiratory complications will improve Outcome: Progressing Goal: Cardiovascular complication will be avoided Outcome: Progressing   Problem: Activity: Goal: Risk for activity intolerance will decrease Outcome: Progressing   Problem: Nutrition: Goal: Adequate nutrition will be maintained Outcome: Progressing   Problem: Coping: Goal: Level of anxiety will decrease Outcome: Progressing   Problem: Elimination: Goal: Will not experience complications related to bowel motility Outcome: Progressing Goal: Will not experience complications related to urinary retention Outcome: Progressing   Problem: Pain Managment: Goal: General experience of comfort will improve Outcome: Progressing   Problem: Safety: Goal:  Ability to remain free from injury will improve Outcome: Progressing   Problem: Skin Integrity: Goal: Risk for impaired skin integrity will decrease Outcome: Progressing   Problem: Education: Goal: Understanding of cardiac disease, CV risk reduction, and recovery process will improve Outcome: Progressing Goal: Understanding of medication regimen will improve Outcome: Progressing Goal: Individualized Educational Video(s) Outcome: Progressing   Problem: Activity: Goal: Ability to tolerate increased activity will improve Outcome: Progressing   Problem: Cardiac: Goal: Ability to achieve and maintain adequate cardiopulmonary perfusion will improve Outcome: Progressing Goal: Vascular access site(s) Level 0-1 will be maintained Outcome: Progressing   Problem: Health Behavior/Discharge Planning: Goal: Ability to safely manage health-related needs after discharge will improve Outcome: Progressing

## 2019-11-19 NOTE — Progress Notes (Signed)
Discussed with pt  IS (1000 mL), mobility, d/c planning. Discussed need to cut his sternum. Pt is HOH and has a speech impediment. Gave materials to read and set up preop video. He tells me his mobility is limited at baseline due to his CVA. He does use a cane. Anticipate need for PT/OT and probably SNF at d/c. [id number] Ethelda Chick CES, ACSM 1:53 PM 11/19/2019

## 2019-11-19 NOTE — Progress Notes (Signed)
Progress Note  Patient Name: [name] Date of Encounter: 11/19/2019  Primary Cardiologist: Lesleigh Noe, MD   Subjective   [name] is feeling much better this morning.  He is not short of breath and he denies chest pain.  He is on IV heparin .  Postop day #1 left heart cath via left femoral approach revealing severe calcified left main disease, occluded left circumflex and high-grade mid RCA disease with severe LV dysfunction.  Also has an occluded right common iliac and moderate left iliac disease.  Inpatient Medications    Scheduled Meds: . aspirin EC  81 mg Oral Daily  . Chlorhexidine Gluconate Cloth  6 each Topical Daily  . furosemide  40 mg Intravenous BID  . levalbuterol  0.63 mg Nebulization Once  . metoprolol tartrate  12.5 mg Oral BID  . pantoprazole  40 mg Oral Q1200  . rosuvastatin  10 mg Oral Daily  . sodium chloride flush  3 mL Intravenous Once  . sodium chloride flush  3 mL Intravenous Q12H  . sodium chloride flush  3 mL Intravenous Q12H  . sodium chloride flush  3 mL Intravenous Q12H   Continuous Infusions: . sodium chloride    . sodium chloride    . heparin 800 Units/hr (11/19/19 0700)  . nitroGLYCERIN Stopped (11/18/19 1223)   PRN Meds: sodium chloride, sodium chloride, acetaminophen, acetaminophen, ALPRAZolam, alum & mag hydroxide-simeth, magnesium hydroxide, nitroGLYCERIN, ondansetron (ZOFRAN) IV, ondansetron (ZOFRAN) IV, sodium chloride flush, sodium chloride flush, zolpidem   Vital Signs    Vitals:   11/19/19 0400 11/19/19 0500 11/19/19 0600 11/19/19 0700  BP: (!) 92/58 96/65 98/70    Pulse: 79 81 78 87  Resp: 15 (!) 35 15 (!) 27  Temp:      TempSrc:      SpO2: 95% 96% 96% 96%  Weight:      Height:        Intake/Output Summary (Last 24 hours) at 11/19/2019 0754 Last data filed at 11/19/2019 0700 Gross per 24 hour  Intake 1322.34 ml  Output 2150 ml  Net -827.66 ml   Last 3 Weights 11/18/2019 11/17/2019 11/05/2019  Weight (lbs) 163  lb 12.8 oz 160 lb 11.5 oz 148 lb 5.9 oz  Weight (kg) 74.3 kg 72.9 kg 67.3 kg      Telemetry    Normal sinus rhythm- Personally Reviewed  ECG    Normal sinus rhythm at 80 with early R wave transition and mild lateral ST segment depression not significantly changed from prior EKG.- Personally Reviewed  Physical Exam   GEN: No acute distress.   Neck: No JVD Cardiac: RRR, no murmurs, rubs, or gallops.  Respiratory: Clear to auscultation bilaterally. GI: Soft, nontender, non-distended  MS: No edema; No deformity. Neuro:  Nonfocal  Psych: Normal affect   Labs    High Sensitivity Troponin:   Recent Labs  Lab 11/03/19 1649 11/17/19 1324 11/17/19 1523 11/17/19 1822 11/17/19 2110  TROPONINIHS 402* 1,867* 1,824* 1,630* 1,794*      Chemistry Recent Labs  Lab 11/17/19 1324 11/18/19 0222  NA 133* 135  K 4.1 4.5  CL 102 105  CO2 22 21*  GLUCOSE 109* 115*  BUN 11 22  CREATININE 1.05 0.98  CALCIUM 8.4* 8.0*  GFRNONAA >60 >60  GFRAA >60 >60  ANIONGAP 9 9     Hematology Recent Labs  Lab 11/18/19 0042 11/18/19 0222 11/19/19 0240  WBC 10.0 9.9 6.7  RBC 2.77* 2.75* 2.96*  HGB  9.2* 8.9* 9.4*  HCT 27.5* 26.6* 28.5*  MCV 99.3 96.7 96.3  MCH 33.2 32.4 31.8  MCHC 33.5 33.5 33.0  RDW 19.9* 19.5* 19.6*  PLT 390 365 353    BNP Recent Labs  Lab 11/17/19 1444  BNP 2,124.2*     DDimer No results for input(s): DDIMER in the last 168 hours.   Radiology    DG Chest 2 View  Result Date: 11/17/2019 CLINICAL DATA:  Chest pain. EXAM: CHEST - 2 VIEW COMPARISON:  04/23/2002 and 01/03/2020 chest radiograph. 01/03/2020 CT chest. FINDINGS: New diffuse interstitial prominence with patchy perihilar and bibasilar opacities. No pneumothorax.  Small bilateral pleural effusions. Cardiomediastinal silhouette is partially obscured. Aortic atherosclerotic calcifications. No acute osseous abnormality. IMPRESSION: Mild to moderate pulmonary edema. Cannot exclude underlying infectious  foci. Electronically Signed   By: Primitivo Gauze M.D.   On: 11/17/2019 13:55   CARDIAC CATHETERIZATION  Result Date: 11/18/2019  Ost RCA to Prox RCA lesion is 70% stenosed.  Mid RCA lesion is 75% stenosed.  Ost Cx to Prox Cx lesion is 100% stenosed.  Ost LM to Mid LM lesion is 75% stenosed.  Dist LM to Prox LAD lesion is 90% stenosed.  Mid LAD lesion is 25% stenosed.  There is moderate left ventricular systolic dysfunction.  LV end diastolic pressure is moderately elevated.  The left ventricular ejection fraction is 35-45% by visual estimate.  There is no aortic valve stenosis.  Severe three vessel disease including heavily calcified left main and proximal LAD.  Aortic atherosclerosis with no AAA.  Occluded right common iliac artery. 50% lesion in the left external artery. Discussed with Dr. Gwenlyn Found.  Will obtain surgical consult for possible LIMA to LAD.  IV Lasix ordered.  Restart heparin 8 hours post sheath pull.   PERIPHERAL VASCULAR CATHETERIZATION  Result Date: 11/18/2019  Ost RCA to Prox RCA lesion is 70% stenosed.  Mid RCA lesion is 75% stenosed.  Ost Cx to Prox Cx lesion is 100% stenosed.  Ost LM to Mid LM lesion is 75% stenosed.  Dist LM to Prox LAD lesion is 90% stenosed.  Mid LAD lesion is 25% stenosed.  There is moderate left ventricular systolic dysfunction.  LV end diastolic pressure is moderately elevated.  The left ventricular ejection fraction is 35-45% by visual estimate.  There is no aortic valve stenosis.  Severe three vessel disease including heavily calcified left main and proximal LAD.  Aortic atherosclerosis with no AAA.  Occluded right common iliac artery. 50% lesion in the left external artery. Discussed with Dr. Gwenlyn Found.  Will obtain surgical consult for possible LIMA to LAD.  IV Lasix ordered.  Restart heparin 8 hours post sheath pull.    Cardiac Studies   Left heart cath (11/18/2019)  Conclusion    Ost RCA to Prox RCA lesion is 70%  stenosed.  Mid RCA lesion is 75% stenosed.  Ost Cx to Prox Cx lesion is 100% stenosed.  Ost LM to Mid LM lesion is 75% stenosed.  Dist LM to Prox LAD lesion is 90% stenosed.  Mid LAD lesion is 25% stenosed.  There is moderate left ventricular systolic dysfunction.  LV end diastolic pressure is moderately elevated.  The left ventricular ejection fraction is 35-45% by visual estimate.  There is no aortic valve stenosis.   Severe three vessel disease including heavily calcified left main and proximal LAD.    Aortic atherosclerosis with no AAA.    Occluded right common iliac artery. 50% lesion in the left external  artery.  Discussed with Dr. Allyson Sabal.  Will obtain surgical consult for possible LIMA to LAD.  IV Lasix ordered.  Restart heparin 8 hours post sheath pull.     Coronary Diagrams  Diagnostic Dominance: Right      Patient Profile     MAGDALENO LORTIE is a 84 y.o. male with pmh of HTN, HLD, CVA in 2001 in right sided weakness, prediabetes, B/L carotid artery disease (1-39%), PAD, RBBB, sinus bradycardia who was admitted yesterday with chest pain and shortness of breath.  His enzymes were positive and his BNP was elevated.  He had pulmonary edema on chest x-ray.  He did have subtle EKG changes.  He underwent left heart cath yesterday by Dr. Eldridge Dace revealing severe left main/three-vessel disease with moderately severe LV dysfunction.  Visually his EF appeared to be in the 30 to 35% range.  Cardiothoracic surgery was with was asked to consult.  Assessment & Plan    1: Non-STEMI-troponin rose to 1700.  He no longer has chest pain on IV nitroglycerin.  He does have subtle ST segment depression laterally.  Mr. Emig underwent left heart cath via the left femoral approach by Dr. Eldridge Dace yesterday.  He has severely calcified high-grade left main disease, occluded circumflex and high-grade mid RCA stenosis provides collaterals to the circumflex.  He has surgical anatomy.  Dr.  Vickey Sages was asked to consult.  2: Essential hypertension-blood pressure 85/60 today.  His Cardizem and hydrochlorothiazide lisinopril have been placed on hold.  He is on low-dose beta-blocker.  3: Hyperlipidemia-on statin therapy  4: Acute pulmonary edema-BNP was elevated.  Chest x-ray consistent with pulmonary edema.  He was diuresed 2 L and feels clinically improved.  He currently appears euvolemic.  He continues to diurese on IV Lasix.  His lungs are clear.  Is no peripheral edema.  5: Ischemic cardiomyopathy-EF in the 35% range with an anteroapical wall motion abnormality.  I suspect this represents "stunned myocardium" considering that his troponins did not elevate significantly compared to his wall motion normality.  I suspect his LV function will improve with revascularization.   Clinically improved with diuresis, on IV heparin..  Cardiac cath revealed left main/three-vessel disease with surgical anatomy.  He is being evaluated for CABG.  We will keep him on to H given his critical anatomy on IV heparin until he is revascularized.  For questions or updates, please contact [facility] Please consult [email or web address] for contact info under        Signed, Nanetta Batty, MD  11/19/2019, 7:54 AM

## 2019-11-20 ENCOUNTER — Inpatient Hospital Stay (HOSPITAL_COMMUNITY): Payer: Medicare PPO

## 2019-11-20 ENCOUNTER — Other Ambulatory Visit (HOSPITAL_COMMUNITY): Payer: Medicare PPO

## 2019-11-20 ENCOUNTER — Encounter (HOSPITAL_COMMUNITY): Payer: Self-pay | Admitting: Cardiovascular Disease

## 2019-11-20 ENCOUNTER — Inpatient Hospital Stay (HOSPITAL_COMMUNITY): Payer: Medicare PPO | Admitting: Anesthesiology

## 2019-11-20 ENCOUNTER — Ambulatory Visit: Payer: Self-pay

## 2019-11-20 ENCOUNTER — Inpatient Hospital Stay (HOSPITAL_COMMUNITY)
Admission: EM | Disposition: A | Discharge status: Skilled Nursing Facility | Payer: Self-pay | Source: Home / Self Care | Attending: Cardiothoracic Surgery

## 2019-11-20 ENCOUNTER — Telehealth: Payer: Self-pay

## 2019-11-20 DIAGNOSIS — I10 Essential (primary) hypertension: Secondary | ICD-10-CM

## 2019-11-20 DIAGNOSIS — I2510 Atherosclerotic heart disease of native coronary artery without angina pectoris: Secondary | ICD-10-CM

## 2019-11-20 DIAGNOSIS — I255 Ischemic cardiomyopathy: Secondary | ICD-10-CM | POA: Diagnosis not present

## 2019-11-20 DIAGNOSIS — I214 Non-ST elevation (NSTEMI) myocardial infarction: Secondary | ICD-10-CM | POA: Diagnosis not present

## 2019-11-20 DIAGNOSIS — Z951 Presence of aortocoronary bypass graft: Secondary | ICD-10-CM

## 2019-11-20 DIAGNOSIS — I97411 Intraoperative hemorrhage and hematoma of a circulatory system organ or structure complicating a cardiac bypass: Secondary | ICD-10-CM

## 2019-11-20 HISTORY — PX: TEE WITHOUT CARDIOVERSION: SHX5443

## 2019-11-20 HISTORY — PX: CORONARY ARTERY BYPASS GRAFT: SHX141

## 2019-11-20 LAB — POCT I-STAT, CHEM 8
BUN: 13 mg/dL (ref 8–23)
BUN: 12 mg/dL (ref 8–23)
BUN: 12 mg/dL (ref 8–23)
BUN: 13 mg/dL (ref 8–23)
BUN: 14 mg/dL (ref 8–23)
BUN: 12 mg/dL (ref 8–23)
Calcium, Ion: 1.26 mmol/L (ref 1.15–1.40)
Calcium, Ion: 1.01 mmol/L — ABNORMAL LOW (ref 1.15–1.40)
Calcium, Ion: 1.01 mmol/L — ABNORMAL LOW (ref 1.15–1.40)
Calcium, Ion: 0.83 mmol/L — CL (ref 1.15–1.40)
Calcium, Ion: 1.25 mmol/L (ref 1.15–1.40)
Calcium, Ion: 1.26 mmol/L (ref 1.15–1.40)
Chloride: 100 mmol/L (ref 98–111)
Chloride: 102 mmol/L (ref 98–111)
Chloride: 103 mmol/L (ref 98–111)
Chloride: 101 mmol/L (ref 98–111)
Chloride: 98 mmol/L (ref 98–111)
Chloride: 107 mmol/L (ref 98–111)
Creatinine, Ser: 0.8 mg/dL (ref 0.61–1.24)
Creatinine, Ser: 0.7 mg/dL (ref 0.61–1.24)
Creatinine, Ser: 0.8 mg/dL (ref 0.61–1.24)
Creatinine, Ser: 0.7 mg/dL (ref 0.61–1.24)
Creatinine, Ser: 0.7 mg/dL (ref 0.61–1.24)
Creatinine, Ser: 0.7 mg/dL (ref 0.61–1.24)
Glucose, Bld: 101 mg/dL — ABNORMAL HIGH (ref 70–99)
Glucose, Bld: 139 mg/dL — ABNORMAL HIGH (ref 70–99)
Glucose, Bld: 146 mg/dL — ABNORMAL HIGH (ref 70–99)
Glucose, Bld: 160 mg/dL — ABNORMAL HIGH (ref 70–99)
Glucose, Bld: 165 mg/dL — ABNORMAL HIGH (ref 70–99)
Glucose, Bld: 161 mg/dL — ABNORMAL HIGH (ref 70–99)
HCT: 25 % — ABNORMAL LOW (ref 39.0–52.0)
HCT: 22 % — ABNORMAL LOW (ref 39.0–52.0)
HCT: 23 % — ABNORMAL LOW (ref 39.0–52.0)
HCT: 24 % — ABNORMAL LOW (ref 39.0–52.0)
HCT: 28 % — ABNORMAL LOW (ref 39.0–52.0)
HCT: 36 % — ABNORMAL LOW (ref 39.0–52.0)
Hemoglobin: 8.5 g/dL — ABNORMAL LOW (ref 13.0–17.0)
Hemoglobin: 7.5 g/dL — ABNORMAL LOW (ref 13.0–17.0)
Hemoglobin: 7.8 g/dL — ABNORMAL LOW (ref 13.0–17.0)
Hemoglobin: 8.2 g/dL — ABNORMAL LOW (ref 13.0–17.0)
Hemoglobin: 9.5 g/dL — ABNORMAL LOW (ref 13.0–17.0)
Hemoglobin: 12.2 g/dL — ABNORMAL LOW (ref 13.0–17.0)
Potassium: 4.1 mmol/L (ref 3.5–5.1)
Potassium: 6 mmol/L — ABNORMAL HIGH (ref 3.5–5.1)
Potassium: 6.1 mmol/L — ABNORMAL HIGH (ref 3.5–5.1)
Potassium: 5 mmol/L (ref 3.5–5.1)
Potassium: 5.9 mmol/L — ABNORMAL HIGH (ref 3.5–5.1)
Potassium: 4.1 mmol/L (ref 3.5–5.1)
Sodium: 135 mmol/L (ref 135–145)
Sodium: 133 mmol/L — ABNORMAL LOW (ref 135–145)
Sodium: 135 mmol/L (ref 135–145)
Sodium: 131 mmol/L — ABNORMAL LOW (ref 135–145)
Sodium: 133 mmol/L — ABNORMAL LOW (ref 135–145)
Sodium: 136 mmol/L (ref 135–145)
TCO2: 26 mmol/L (ref 22–32)
TCO2: 25 mmol/L (ref 22–32)
TCO2: 28 mmol/L (ref 22–32)
TCO2: 24 mmol/L (ref 22–32)
TCO2: 25 mmol/L (ref 22–32)
TCO2: 20 mmol/L — ABNORMAL LOW (ref 22–32)

## 2019-11-20 LAB — POCT I-STAT 7, (LYTES, BLD GAS, ICA,H+H)
Acid-base deficit: 3 mmol/L — ABNORMAL HIGH (ref 0.0–2.0)
Acid-base deficit: 2 mmol/L (ref 0.0–2.0)
Acid-base deficit: 3 mmol/L — ABNORMAL HIGH (ref 0.0–2.0)
Acid-base deficit: 10 mmol/L — ABNORMAL HIGH (ref 0.0–2.0)
Bicarbonate: 23.1 mmol/L (ref 20.0–28.0)
Bicarbonate: 22.6 mmol/L (ref 20.0–28.0)
Bicarbonate: 21.2 mmol/L (ref 20.0–28.0)
Bicarbonate: 17.5 mmol/L — ABNORMAL LOW (ref 20.0–28.0)
Calcium, Ion: 1.2 mmol/L (ref 1.15–1.40)
Calcium, Ion: 1.02 mmol/L — ABNORMAL LOW (ref 1.15–1.40)
Calcium, Ion: 1.23 mmol/L (ref 1.15–1.40)
Calcium, Ion: 1.28 mmol/L (ref 1.15–1.40)
HCT: 24 % — ABNORMAL LOW (ref 39.0–52.0)
HCT: 26 % — ABNORMAL LOW (ref 39.0–52.0)
HCT: 26 % — ABNORMAL LOW (ref 39.0–52.0)
HCT: 37 % — ABNORMAL LOW (ref 39.0–52.0)
Hemoglobin: 8.2 g/dL — ABNORMAL LOW (ref 13.0–17.0)
Hemoglobin: 8.8 g/dL — ABNORMAL LOW (ref 13.0–17.0)
Hemoglobin: 8.8 g/dL — ABNORMAL LOW (ref 13.0–17.0)
Hemoglobin: 12.6 g/dL — ABNORMAL LOW (ref 13.0–17.0)
O2 Saturation: 98 %
O2 Saturation: 100 %
O2 Saturation: 100 %
O2 Saturation: 93 %
Patient temperature: 36.6
Potassium: 4.4 mmol/L (ref 3.5–5.1)
Potassium: 6 mmol/L — ABNORMAL HIGH (ref 3.5–5.1)
Potassium: 5 mmol/L (ref 3.5–5.1)
Potassium: 4.1 mmol/L (ref 3.5–5.1)
Sodium: 135 mmol/L (ref 135–145)
Sodium: 134 mmol/L — ABNORMAL LOW (ref 135–145)
Sodium: 134 mmol/L — ABNORMAL LOW (ref 135–145)
Sodium: 137 mmol/L (ref 135–145)
TCO2: 24 mmol/L (ref 22–32)
TCO2: 24 mmol/L (ref 22–32)
TCO2: 22 mmol/L (ref 22–32)
TCO2: 19 mmol/L — ABNORMAL LOW (ref 22–32)
pCO2 arterial: 46.4 mmHg (ref 32.0–48.0)
pCO2 arterial: 36.7 mmHg (ref 32.0–48.0)
pCO2 arterial: 33.4 mmHg (ref 32.0–48.0)
pCO2 arterial: 41.3 mmHg (ref 32.0–48.0)
pH, Arterial: 7.305 — ABNORMAL LOW (ref 7.350–7.450)
pH, Arterial: 7.396 (ref 7.350–7.450)
pH, Arterial: 7.41 (ref 7.350–7.450)
pH, Arterial: 7.233 — ABNORMAL LOW (ref 7.350–7.450)
pO2, Arterial: 126 mmHg — ABNORMAL HIGH (ref 83.0–108.0)
pO2, Arterial: 298 mmHg — ABNORMAL HIGH (ref 83.0–108.0)
pO2, Arterial: 271 mmHg — ABNORMAL HIGH (ref 83.0–108.0)
pO2, Arterial: 78 mmHg — ABNORMAL LOW (ref 83.0–108.0)

## 2019-11-20 LAB — BASIC METABOLIC PANEL
Anion gap: 10 (ref 5–15)
BUN: 14 mg/dL (ref 8–23)
CO2: 22 mmol/L (ref 22–32)
Calcium: 8.3 mg/dL — ABNORMAL LOW (ref 8.9–10.3)
Chloride: 101 mmol/L (ref 98–111)
Creatinine, Ser: 0.98 mg/dL (ref 0.61–1.24)
GFR calc Af Amer: >60 mL/min (ref >60)
GFR calc non Af Amer: >60 mL/min (ref >60)
Glucose, Bld: 100 mg/dL — ABNORMAL HIGH (ref 70–99)
Potassium: 4.1 mmol/L (ref 3.5–5.1)
Sodium: 133 mmol/L — ABNORMAL LOW (ref 135–145)

## 2019-11-20 LAB — GLUCOSE, CAPILLARY
Glucose-Capillary: 101 mg/dL — ABNORMAL HIGH (ref 70–99)
Glucose-Capillary: 102 mg/dL — ABNORMAL HIGH (ref 70–99)
Glucose-Capillary: 165 mg/dL — ABNORMAL HIGH (ref 70–99)
Glucose-Capillary: 175 mg/dL — ABNORMAL HIGH (ref 70–99)
Glucose-Capillary: 197 mg/dL — ABNORMAL HIGH (ref 70–99)
Glucose-Capillary: 97 mg/dL (ref 70–99)
Glucose-Capillary: 98 mg/dL (ref 70–99)

## 2019-11-20 LAB — CBC
HCT: 26.9 % — ABNORMAL LOW (ref 39.0–52.0)
Hemoglobin: 8.9 g/dL — ABNORMAL LOW (ref 13.0–17.0)
MCH: 32.2 pg (ref 26.0–34.0)
MCHC: 33.1 g/dL (ref 30.0–36.0)
MCV: 97.5 fL (ref 80.0–100.0)
Platelets: 345 10*3/uL (ref 150–400)
RBC: 2.76 MIL/uL — ABNORMAL LOW (ref 4.22–5.81)
RDW: 19.6 % — ABNORMAL HIGH (ref 11.5–15.5)
WBC: 6.5 10*3/uL (ref 4.0–10.5)
nRBC: 0.6 % — ABNORMAL HIGH (ref 0.0–0.2)

## 2019-11-20 LAB — HEMOGLOBIN AND HEMATOCRIT, BLOOD
HCT: 22.7 % — ABNORMAL LOW (ref 39.0–52.0)
Hemoglobin: 7.6 g/dL — ABNORMAL LOW (ref 13.0–17.0)

## 2019-11-20 LAB — PREPARE RBC (CROSSMATCH)

## 2019-11-20 LAB — PROTIME-INR
INR: 1.5 — ABNORMAL HIGH (ref 0.8–1.2)
INR: 1.6 — ABNORMAL HIGH (ref 0.8–1.2)
Prothrombin Time: 18.4 seconds — ABNORMAL HIGH (ref 11.4–15.2)
Prothrombin Time: 19.4 seconds — ABNORMAL HIGH (ref 11.4–15.2)

## 2019-11-20 LAB — APTT
aPTT: 33 seconds (ref 24–36)
aPTT: 44 seconds — ABNORMAL HIGH (ref 24–36)

## 2019-11-20 LAB — FIBRINOGEN
Fibrinogen: 243 mg/dL (ref 210–475)
Fibrinogen: 223 mg/dL (ref 210–475)

## 2019-11-20 LAB — ECHO INTRAOPERATIVE TEE
Height: 65.984 in
Weight: 2497.37 oz

## 2019-11-20 LAB — SURGICAL PCR SCREEN
MRSA, PCR: NEGATIVE
Staphylococcus aureus: NEGATIVE

## 2019-11-20 LAB — HEPARIN LEVEL (UNFRACTIONATED): Heparin Unfractionated: 0.68 IU/mL (ref 0.30–0.70)

## 2019-11-20 LAB — PLATELET COUNT: Platelets: 214 10*3/uL (ref 150–400)

## 2019-11-20 SURGERY — CORONARY ARTERY BYPASS GRAFTING (CABG)
Anesthesia: General | Site: Chest

## 2019-11-20 MED ORDER — SODIUM CHLORIDE 0.9 % IV SOLN
20.0000 ug | Freq: Once | INTRAVENOUS | Status: AC
  Administered 2019-11-20: 20 ug via INTRAVENOUS
  Filled 2019-11-20: qty 5

## 2019-11-20 MED ORDER — MIDAZOLAM HCL 2 MG/2ML IJ SOLN
INTRAMUSCULAR | Status: AC
  Filled 2019-11-20: qty 2

## 2019-11-20 MED ORDER — ACETAMINOPHEN 160 MG/5ML PO SOLN
1000.0000 mg | Freq: Four times a day (QID) | ORAL | Status: AC
  Administered 2019-11-21 – 2019-11-23 (×5): 1000 mg
  Filled 2019-11-20 (×6): qty 40.6

## 2019-11-20 MED ORDER — TRAMADOL HCL 50 MG PO TABS
50.0000 mg | ORAL_TABLET | ORAL | Status: DC | PRN
  Administered 2019-11-23 – 2019-11-27 (×3): 50 mg via ORAL
  Administered 2019-11-27: 100 mg via ORAL
  Filled 2019-11-20: qty 1
  Filled 2019-11-20: qty 2
  Filled 2019-11-20: qty 1
  Filled 2019-11-20: qty 2

## 2019-11-20 MED ORDER — 0.9 % SODIUM CHLORIDE (POUR BTL) OPTIME
TOPICAL | Status: DC | PRN
  Administered 2019-11-20: 1000 mL

## 2019-11-20 MED ORDER — MIDAZOLAM HCL (PF) 10 MG/2ML IJ SOLN
INTRAMUSCULAR | Status: AC
  Filled 2019-11-20: qty 2

## 2019-11-20 MED ORDER — SODIUM BICARBONATE 8.4 % IV SOLN
INTRAVENOUS | Status: AC
  Filled 2019-11-20: qty 50

## 2019-11-20 MED ORDER — CHLORHEXIDINE GLUCONATE 0.12 % MT SOLN
15.0000 mL | OROMUCOSAL | Status: AC
  Administered 2019-11-20: 15 mL via OROMUCOSAL
  Filled 2019-11-20: qty 15

## 2019-11-20 MED ORDER — AMIODARONE HCL IN DEXTROSE 360-4.14 MG/200ML-% IV SOLN
30.0000 mg/h | INTRAVENOUS | Status: DC
  Administered 2019-11-21 – 2019-11-23 (×4): 30 mg/h via INTRAVENOUS
  Filled 2019-11-20: qty 400
  Filled 2019-11-20 (×5): qty 200

## 2019-11-20 MED ORDER — FENTANYL CITRATE (PF) 250 MCG/5ML IJ SOLN
INTRAMUSCULAR | Status: DC | PRN
  Administered 2019-11-20: 250 ug via INTRAVENOUS
  Administered 2019-11-20: 150 ug via INTRAVENOUS
  Administered 2019-11-20: 350 ug via INTRAVENOUS
  Administered 2019-11-20: 250 ug via INTRAVENOUS

## 2019-11-20 MED ORDER — ACETAMINOPHEN 160 MG/5ML PO SOLN
650.0000 mg | Freq: Once | ORAL | Status: AC
  Administered 2019-11-20: 650 mg
  Filled 2019-11-20: qty 20.3

## 2019-11-20 MED ORDER — SODIUM CHLORIDE 0.9% IV SOLUTION: Freq: Once | INTRAVENOUS | Status: AC

## 2019-11-20 MED ORDER — STERILE WATER FOR INJECTION IJ SOLN
INTRAMUSCULAR | Status: AC
  Filled 2019-11-20: qty 10

## 2019-11-20 MED ORDER — SODIUM BICARBONATE 8.4 % IV SOLN
150.0000 meq | Freq: Once | INTRAVENOUS | Status: AC
  Administered 2019-11-20: 150 meq via INTRAVENOUS

## 2019-11-20 MED ORDER — EPHEDRINE 5 MG/ML INJ
INTRAVENOUS | Status: AC
  Filled 2019-11-20: qty 10

## 2019-11-20 MED ORDER — ACETAMINOPHEN 500 MG PO TABS
1000.0000 mg | ORAL_TABLET | Freq: Four times a day (QID) | ORAL | Status: AC
  Administered 2019-11-23: 1000 mg via ORAL
  Filled 2019-11-20 (×2): qty 2

## 2019-11-20 MED ORDER — MORPHINE SULFATE (PF) 2 MG/ML IV SOLN
1.0000 mg | INTRAVENOUS | Status: DC | PRN
  Administered 2019-11-20: 2 mg via INTRAVENOUS
  Administered 2019-11-21: 4 mg via INTRAVENOUS
  Administered 2019-11-21 – 2019-11-23 (×12): 2 mg via INTRAVENOUS
  Administered 2019-11-23 (×2): 1 mg via INTRAVENOUS
  Administered 2019-11-23 – 2019-11-24 (×4): 2 mg via INTRAVENOUS
  Filled 2019-11-20 (×3): qty 1
  Filled 2019-11-20: qty 2
  Filled 2019-11-20: qty 1
  Filled 2019-11-20 (×3): qty 2
  Filled 2019-11-20 (×2): qty 1
  Filled 2019-11-20: qty 2
  Filled 2019-11-20 (×4): qty 1

## 2019-11-20 MED ORDER — MILRINONE LACTATE IN DEXTROSE 20-5 MG/100ML-% IV SOLN
0.3000 ug/kg/min | INTRAVENOUS | Status: DC
  Administered 2019-11-21: 0.3 ug/kg/min via INTRAVENOUS
  Administered 2019-11-21: 0.5 ug/kg/min via INTRAVENOUS
  Filled 2019-11-20 (×2): qty 100

## 2019-11-20 MED ORDER — LACTATED RINGERS IV SOLN
INTRAVENOUS | Status: DC
  Administered 2019-11-24: 800 mL via INTRAVENOUS

## 2019-11-20 MED ORDER — PROTAMINE SULFATE 10 MG/ML IV SOLN
INTRAVENOUS | Status: DC | PRN
  Administered 2019-11-20 (×3): 30 mg via INTRAVENOUS
  Administered 2019-11-20: 20 mg via INTRAVENOUS
  Administered 2019-11-20 (×3): 30 mg via INTRAVENOUS

## 2019-11-20 MED ORDER — OXYCODONE HCL 5 MG PO TABS: 5.0000 mg | ORAL_TABLET | ORAL | Status: DC | PRN

## 2019-11-20 MED ORDER — FENTANYL CITRATE (PF) 100 MCG/2ML IJ SOLN
INTRAMUSCULAR | Status: AC
  Filled 2019-11-20: qty 2

## 2019-11-20 MED ORDER — MAGNESIUM SULFATE 4 GM/100ML IV SOLN
4.0000 g | Freq: Once | INTRAVENOUS | Status: AC
  Administered 2019-11-20: 4 g via INTRAVENOUS
  Filled 2019-11-20: qty 100

## 2019-11-20 MED ORDER — HEMOSTATIC AGENTS (NO CHARGE) OPTIME
TOPICAL | Status: DC | PRN
  Administered 2019-11-20 (×3): 1 via TOPICAL

## 2019-11-20 MED ORDER — SODIUM CHLORIDE 0.9 % IV SOLN: 250.0000 mL | INTRAVENOUS | Status: DC

## 2019-11-20 MED ORDER — POTASSIUM CHLORIDE 10 MEQ/50ML IV SOLN: 10.0000 meq | INTRAVENOUS | Status: DC

## 2019-11-20 MED ORDER — ALBUMIN HUMAN 5 % IV SOLN
250.0000 mL | INTRAVENOUS | Status: AC | PRN
  Administered 2019-11-20 – 2019-11-21 (×4): 12.5 g via INTRAVENOUS
  Filled 2019-11-20: qty 250

## 2019-11-20 MED ORDER — SODIUM CHLORIDE 0.45 % IV SOLN: INTRAVENOUS | Status: DC | PRN

## 2019-11-20 MED ORDER — METOPROLOL TARTRATE 25 MG/10 ML ORAL SUSPENSION: 12.5000 mg | Freq: Two times a day (BID) | ORAL | Status: DC

## 2019-11-20 MED ORDER — HEPARIN SODIUM (PORCINE) 1000 UNIT/ML IJ SOLN
INTRAMUSCULAR | Status: DC | PRN
  Administered 2019-11-20: 22000 [IU] via INTRAVENOUS

## 2019-11-20 MED ORDER — VANCOMYCIN HCL IN DEXTROSE 1-5 GM/200ML-% IV SOLN
1000.0000 mg | Freq: Once | INTRAVENOUS | Status: AC
  Administered 2019-11-21: 1000 mg via INTRAVENOUS
  Filled 2019-11-20: qty 200

## 2019-11-20 MED ORDER — NITROGLYCERIN IN D5W 200-5 MCG/ML-% IV SOLN: 0.0000 ug/min | INTRAVENOUS | Status: DC

## 2019-11-20 MED ORDER — PROPOFOL 10 MG/ML IV BOLUS
INTRAVENOUS | Status: DC | PRN
  Administered 2019-11-20: 50 mg via INTRAVENOUS

## 2019-11-20 MED ORDER — VANCOMYCIN HCL 1000 MG IV SOLR
INTRAVENOUS | Status: DC | PRN
  Administered 2019-11-20: 1250 mg via INTRAVENOUS

## 2019-11-20 MED ORDER — LACTATED RINGERS IV SOLN: INTRAVENOUS | Status: DC

## 2019-11-20 MED ORDER — SODIUM CHLORIDE 0.9% FLUSH: 3.0000 mL | INTRAVENOUS | Status: DC | PRN

## 2019-11-20 MED ORDER — ROCURONIUM BROMIDE 100 MG/10ML IV SOLN
INTRAVENOUS | Status: DC | PRN
  Administered 2019-11-20 (×3): 50 mg via INTRAVENOUS

## 2019-11-20 MED ORDER — NOREPINEPHRINE 4 MG/250ML-% IV SOLN
0.0000 ug/min | INTRAVENOUS | Status: DC
  Administered 2019-11-21 (×2): 8 ug/min via INTRAVENOUS
  Administered 2019-11-22: 5 ug/min via INTRAVENOUS
  Filled 2019-11-20 (×7): qty 250

## 2019-11-20 MED ORDER — VANCOMYCIN HCL 1000 MG IV SOLR
INTRAVENOUS | Status: AC
  Filled 2019-11-20: qty 3000

## 2019-11-20 MED ORDER — PHENYLEPHRINE HCL-NACL 20-0.9 MG/250ML-% IV SOLN: 0.0000 ug/min | INTRAVENOUS | Status: DC

## 2019-11-20 MED ORDER — ALBUMIN HUMAN 5 % IV SOLN: INTRAVENOUS | Status: DC | PRN

## 2019-11-20 MED ORDER — METOPROLOL TARTRATE 12.5 MG HALF TABLET: 12.5000 mg | ORAL_TABLET | Freq: Two times a day (BID) | ORAL | Status: DC

## 2019-11-20 MED ORDER — VANCOMYCIN HCL 1000 MG IV SOLR
INTRAVENOUS | Status: DC | PRN
  Administered 2019-11-20 (×3): 1000 mg

## 2019-11-20 MED ORDER — LACTATED RINGERS IV SOLN: INTRAVENOUS | Status: DC | PRN

## 2019-11-20 MED ORDER — EPINEPHRINE PF 1 MG/ML IJ SOLN
0.0000 ug/min | INTRAVENOUS | Status: DC
  Filled 2019-11-20: qty 4

## 2019-11-20 MED ORDER — BISACODYL 5 MG PO TBEC
10.0000 mg | DELAYED_RELEASE_TABLET | Freq: Every day | ORAL | Status: DC
  Administered 2019-11-23 – 2019-12-06 (×6): 10 mg via ORAL
  Filled 2019-11-20 (×10): qty 2

## 2019-11-20 MED ORDER — PHENYLEPHRINE HCL (PRESSORS) 10 MG/ML IV SOLN
INTRAVENOUS | Status: DC | PRN
  Administered 2019-11-20: 200 ug via INTRAVENOUS

## 2019-11-20 MED ORDER — METOCLOPRAMIDE HCL 5 MG/ML IJ SOLN
10.0000 mg | Freq: Four times a day (QID) | INTRAMUSCULAR | Status: AC
  Administered 2019-11-21 (×2): 10 mg via INTRAVENOUS
  Filled 2019-11-20 (×3): qty 2

## 2019-11-20 MED ORDER — DEXTROSE 50 % IV SOLN: 0.0000 mL | INTRAVENOUS | Status: DC | PRN

## 2019-11-20 MED ORDER — AMIODARONE HCL IN DEXTROSE 360-4.14 MG/200ML-% IV SOLN: 60.0000 mg/h | INTRAVENOUS | Status: DC

## 2019-11-20 MED ORDER — PROPOFOL 10 MG/ML IV BOLUS
INTRAVENOUS | Status: AC
  Filled 2019-11-20: qty 20

## 2019-11-20 MED ORDER — PHENYLEPHRINE HCL-NACL 10-0.9 MG/250ML-% IV SOLN
INTRAVENOUS | Status: DC | PRN
  Administered 2019-11-20: 100 ug/min via INTRAVENOUS

## 2019-11-20 MED ORDER — DEXAMETHASONE SODIUM PHOSPHATE 10 MG/ML IJ SOLN
INTRAMUSCULAR | Status: DC | PRN
  Administered 2019-11-20: 10 mg via INTRAVENOUS

## 2019-11-20 MED ORDER — BISACODYL 10 MG RE SUPP
10.0000 mg | Freq: Every day | RECTAL | Status: DC
  Filled 2019-11-20 (×2): qty 1

## 2019-11-20 MED ORDER — BUPIVACAINE HCL (PF) 0.5 % IJ SOLN
INTRAMUSCULAR | Status: AC
  Filled 2019-11-20: qty 30

## 2019-11-20 MED ORDER — LACTATED RINGERS IV SOLN: 500.0000 mL | Freq: Once | INTRAVENOUS | Status: DC | PRN

## 2019-11-20 MED ORDER — MIDAZOLAM HCL 5 MG/5ML IJ SOLN
INTRAMUSCULAR | Status: DC | PRN
  Administered 2019-11-20: 1 mg via INTRAVENOUS
  Administered 2019-11-20: 2 mg via INTRAVENOUS
  Administered 2019-11-20 (×2): 1 mg via INTRAVENOUS

## 2019-11-20 MED ORDER — ASPIRIN EC 325 MG PO TBEC: 325.0000 mg | DELAYED_RELEASE_TABLET | Freq: Every day | ORAL | Status: DC

## 2019-11-20 MED ORDER — SODIUM CHLORIDE 0.9% FLUSH
3.0000 mL | Freq: Two times a day (BID) | INTRAVENOUS | Status: DC
  Administered 2019-11-21 – 2019-11-26 (×9): 3 mL via INTRAVENOUS
  Administered 2019-11-26: 10 mL via INTRAVENOUS
  Administered 2019-11-27 – 2019-12-06 (×15): 3 mL via INTRAVENOUS

## 2019-11-20 MED ORDER — MIDAZOLAM HCL 2 MG/2ML IJ SOLN
2.0000 mg | INTRAMUSCULAR | Status: DC | PRN
  Administered 2019-11-21 – 2019-11-23 (×13): 2 mg via INTRAVENOUS
  Filled 2019-11-20 (×13): qty 2

## 2019-11-20 MED ORDER — SODIUM CHLORIDE 0.9 % IV SOLN
INTRAVENOUS | Status: DC | PRN
  Administered 2019-11-20: .75 g via INTRAVENOUS
  Administered 2019-11-20: 1.5 g via INTRAVENOUS

## 2019-11-20 MED ORDER — ASPIRIN 81 MG PO CHEW: 324.0000 mg | CHEWABLE_TABLET | Freq: Every day | ORAL | Status: DC

## 2019-11-20 MED ORDER — SODIUM CHLORIDE 0.9 % IV SOLN
1.5000 g | Freq: Two times a day (BID) | INTRAVENOUS | Status: DC
  Administered 2019-11-21 – 2019-11-22 (×3): 1.5 g via INTRAVENOUS
  Filled 2019-11-20 (×4): qty 1.5

## 2019-11-20 MED ORDER — ACETAMINOPHEN 650 MG RE SUPP: 650.0000 mg | Freq: Once | RECTAL | Status: AC

## 2019-11-20 MED ORDER — ONDANSETRON HCL 4 MG/2ML IJ SOLN: 4.0000 mg | Freq: Four times a day (QID) | INTRAMUSCULAR | Status: DC | PRN

## 2019-11-20 MED ORDER — FENTANYL CITRATE (PF) 250 MCG/5ML IJ SOLN
INTRAMUSCULAR | Status: AC
  Filled 2019-11-20: qty 20

## 2019-11-20 MED ORDER — FAMOTIDINE IN NACL 20-0.9 MG/50ML-% IV SOLN
20.0000 mg | Freq: Two times a day (BID) | INTRAVENOUS | Status: AC
  Administered 2019-11-20 – 2019-11-21 (×2): 20 mg via INTRAVENOUS
  Filled 2019-11-20 (×2): qty 50

## 2019-11-20 MED ORDER — SODIUM CHLORIDE (PF) 0.9 % IJ SOLN
OROMUCOSAL | Status: DC | PRN
  Administered 2019-11-20 (×2): 4 mL via TOPICAL

## 2019-11-20 MED ORDER — EPINEPHRINE HCL 5 MG/250ML IV SOLN IN NS
0.5000 ug/min | INTRAVENOUS | Status: DC
  Administered 2019-11-21 (×2): 4 ug/min via INTRAVENOUS
  Administered 2019-11-21 – 2019-11-22 (×2): 5 ug/min via INTRAVENOUS
  Administered 2019-11-23: 5.013 ug/min via INTRAVENOUS
  Administered 2019-11-23: 3 ug/min via INTRAVENOUS
  Filled 2019-11-20 (×5): qty 250

## 2019-11-20 MED ORDER — DOCUSATE SODIUM 100 MG PO CAPS
200.0000 mg | ORAL_CAPSULE | Freq: Every day | ORAL | Status: DC
  Administered 2019-11-23 – 2019-12-06 (×12): 200 mg via ORAL
  Filled 2019-11-20 (×14): qty 2

## 2019-11-20 MED ORDER — INSULIN REGULAR(HUMAN) IN NACL 100-0.9 UT/100ML-% IV SOLN
INTRAVENOUS | Status: DC
  Filled 2019-11-20: qty 100

## 2019-11-20 MED ORDER — DEXMEDETOMIDINE HCL IN NACL 400 MCG/100ML IV SOLN
0.2000 ug/kg/h | INTRAVENOUS | Status: DC
  Administered 2019-11-21: 0.5 ug/kg/h via INTRAVENOUS
  Administered 2019-11-21 – 2019-11-22 (×3): 0.7 ug/kg/h via INTRAVENOUS
  Filled 2019-11-20 (×5): qty 100

## 2019-11-20 MED ORDER — SODIUM CHLORIDE 0.9 % IV SOLN: INTRAVENOUS | Status: DC

## 2019-11-20 MED ORDER — METOPROLOL TARTRATE 5 MG/5ML IV SOLN: 2.5000 mg | INTRAVENOUS | Status: DC | PRN

## 2019-11-20 MED ORDER — PANTOPRAZOLE SODIUM 40 MG PO TBEC
40.0000 mg | DELAYED_RELEASE_TABLET | Freq: Every day | ORAL | Status: DC
  Administered 2019-11-23 – 2019-12-06 (×14): 40 mg via ORAL
  Filled 2019-11-20 (×14): qty 1

## 2019-11-20 SURGICAL SUPPLY — 97 items
ADAPTER CARDIO PERF ANTE/RETRO (ADAPTER) ×3 IMPLANT
ATRICLIP EXCLUSION VLAA 45 (Miscellaneous) ×3 IMPLANT
BAG DECANTER FOR FLEXI CONT (MISCELLANEOUS) ×3 IMPLANT
BASKET HEART (ORDER IN 25'S) (MISCELLANEOUS) ×2
BASKET HEART (ORDER IN 25S) (MISCELLANEOUS) ×2 IMPLANT
BATTERY MAXDRIVER (MISCELLANEOUS) ×3 IMPLANT
BLADE CLIPPER SURG (BLADE) ×3 IMPLANT
BLADE STERNUM SYSTEM 6 (BLADE) ×3 IMPLANT
BLADE SURG 11 STRL SS (BLADE) ×3 IMPLANT
BNDG ELASTIC 4X5.8 VLCR STR LF (GAUZE/BANDAGES/DRESSINGS) ×3 IMPLANT
BNDG ELASTIC 6X15 VLCR STRL LF (GAUZE/BANDAGES/DRESSINGS) ×3 IMPLANT
BNDG ELASTIC 6X5.8 VLCR STR LF (GAUZE/BANDAGES/DRESSINGS) ×3 IMPLANT
BNDG GAUZE ELAST 4 BULKY (GAUZE/BANDAGES/DRESSINGS) ×3 IMPLANT
CANISTER SUCT 3000ML PPV (MISCELLANEOUS) ×3 IMPLANT
CANNULA NON VENT 20FR 12 (CANNULA) ×3 IMPLANT
CATH CPB KIT HENDRICKSON (MISCELLANEOUS) ×3 IMPLANT
CATH ROBINSON RED A/P 18FR (CATHETERS) ×6 IMPLANT
CLIP RETRACTION 3.0MM CORONARY (MISCELLANEOUS) ×3 IMPLANT
CNTNR URN SCR LID CUP LEK RST (MISCELLANEOUS) ×2 IMPLANT
CONT SPEC 4OZ STRL OR WHT (MISCELLANEOUS) ×2
COVER MAYO STAND STRL (DRAPES) ×3 IMPLANT
DEFOGGER ANTIFOG KIT (MISCELLANEOUS) ×3 IMPLANT
DERMABOND ADVANCED (GAUZE/BANDAGES/DRESSINGS) ×1
DERMABOND ADVANCED .7 DNX12 (GAUZE/BANDAGES/DRESSINGS) ×2 IMPLANT
DEVICE EXCLUSIN ATRCLP VLAA 45 (Miscellaneous) ×2 IMPLANT
DRAIN CHANNEL 28F RND 3/8 FF (WOUND CARE) ×9 IMPLANT
DRAPE CARDIOVASCULAR INCISE (DRAPES) ×2
DRAPE SLUSH/WARMER DISC (DRAPES) ×3 IMPLANT
DRAPE SRG 135X102X78XABS (DRAPES) ×2 IMPLANT
DRSG AQUACEL AG ADV 3.5X14 (GAUZE/BANDAGES/DRESSINGS) ×3 IMPLANT
ELECT CAUTERY BLADE 6.4 (BLADE) ×3 IMPLANT
ELECT REM PT RETURN 9FT ADLT (ELECTROSURGICAL) ×6
ELECTRODE REM PT RTRN 9FT ADLT (ELECTROSURGICAL) ×4 IMPLANT
FELT TEFLON 1X6 (MISCELLANEOUS) ×6 IMPLANT
GAUZE SPONGE 4X4 12PLY STRL (GAUZE/BANDAGES/DRESSINGS) ×6 IMPLANT
GLOVE BIO SURGEON STRL SZ 6.5 (GLOVE) ×18 IMPLANT
GLOVE BIOGEL PI IND STRL 6.5 (GLOVE) ×2 IMPLANT
GLOVE BIOGEL PI INDICATOR 6.5 (GLOVE) ×1
GLOVE NEODERM STRL 7.5 LF PF (GLOVE) ×6 IMPLANT
GLOVE SURG NEODERM 7.5  LF PF (GLOVE) ×3
GOWN STRL REUS W/ TWL LRG LVL3 (GOWN DISPOSABLE) ×8 IMPLANT
GOWN STRL REUS W/TWL LRG LVL3 (GOWN DISPOSABLE) ×8
HEMOSTAT POWDER SURGIFOAM 1G (HEMOSTASIS) ×6 IMPLANT
KIT BASIN OR (CUSTOM PROCEDURE TRAY) ×3 IMPLANT
KIT SUCTION CATH 14FR (SUCTIONS) ×3 IMPLANT
KIT TURNOVER KIT B (KITS) ×3 IMPLANT
KIT VASOVIEW HEMOPRO 2 VH 4000 (KITS) ×3 IMPLANT
MARKER GRAFT CORONARY BYPASS (MISCELLANEOUS) ×9 IMPLANT
NEEDLE 18GX1X1/2 (RX/OR ONLY) (NEEDLE) ×3 IMPLANT
NS IRRIG 1000ML POUR BTL (IV SOLUTION) ×15 IMPLANT
PACK E OPEN HEART (SUTURE) ×3 IMPLANT
PACK OPEN HEART (CUSTOM PROCEDURE TRAY) ×3 IMPLANT
PAD ARMBOARD 7.5X6 YLW CONV (MISCELLANEOUS) ×6 IMPLANT
PAD ELECT DEFIB RADIOL ZOLL (MISCELLANEOUS) ×3 IMPLANT
PENCIL BUTTON HOLSTER BLD 10FT (ELECTRODE) ×3 IMPLANT
PLATE STERNAL 2.3X208 14H 2-PK (Plate) ×3 IMPLANT
POSITIONER HEAD DONUT 9IN (MISCELLANEOUS) ×3 IMPLANT
POWDER SURGICEL 3.0 GRAM (HEMOSTASIS) IMPLANT
PUNCH AORTIC ROTATE 4.5MM 8IN (MISCELLANEOUS) ×3 IMPLANT
SCREW BONE LOCKING 2.3X9 (Screw) ×6 IMPLANT
SCREW LOCKING TI 2.3X11MM (Screw) ×15 IMPLANT
SCREW STERNAL LOCK 2.3MM (Screw) ×12 IMPLANT
SEALANT SURG COSEAL 8ML (VASCULAR PRODUCTS) IMPLANT
SET CARDIOPLEGIA MPS 5001102 (MISCELLANEOUS) ×3 IMPLANT
SET MICROPUNCTURE 5F STIFF (MISCELLANEOUS) ×3 IMPLANT
SPONGE LAP 18X18 X RAY DECT (DISPOSABLE) ×3 IMPLANT
STOPCOCK 4 WAY LG BORE MALE ST (IV SETS) ×3 IMPLANT
SUT BONE WAX W31G (SUTURE) ×3 IMPLANT
SUT MNCRL AB 3-0 PS2 18 (SUTURE) ×6 IMPLANT
SUT MNCRL AB 4-0 PS2 18 (SUTURE) ×3 IMPLANT
SUT PDS AB 1 CTX 36 (SUTURE) ×6 IMPLANT
SUT PROLENE 3 0 SH DA (SUTURE) ×6 IMPLANT
SUT PROLENE 4 0 SH DA (SUTURE) ×9 IMPLANT
SUT PROLENE 5 0 C 1 36 (SUTURE) ×3 IMPLANT
SUT PROLENE 6 0 C 1 30 (SUTURE) ×9 IMPLANT
SUT PROLENE 7 0 BV 1 (SUTURE) ×3 IMPLANT
SUT PROLENE BLUE 7 0 (SUTURE) ×3 IMPLANT
SUT SILK  1 MH (SUTURE) ×2
SUT SILK 1 MH (SUTURE) ×2 IMPLANT
SUT STEEL 6MS V (SUTURE) ×3 IMPLANT
SUT STEEL SZ 6 DBL 3X14 BALL (SUTURE) ×3 IMPLANT
SUT VIC AB 2-0 CT1 27 (SUTURE) ×2
SUT VIC AB 2-0 CT1 TAPERPNT 27 (SUTURE) ×2 IMPLANT
SYR 30ML LL (SYRINGE) ×3 IMPLANT
SYR 3ML LL SCALE MARK (SYRINGE) ×3 IMPLANT
SYSTEM SAHARA CHEST DRAIN ATS (WOUND CARE) ×3 IMPLANT
TAPE CLOTH SOFT 2X10 (GAUZE/BANDAGES/DRESSINGS) ×3 IMPLANT
TAPE CLOTH SURG 4X10 WHT LF (GAUZE/BANDAGES/DRESSINGS) ×3 IMPLANT
TOWEL GREEN STERILE (TOWEL DISPOSABLE) ×3 IMPLANT
TOWEL GREEN STERILE FF (TOWEL DISPOSABLE) ×3 IMPLANT
TRAY FOLEY SLVR 16FR TEMP STAT (SET/KITS/TRAYS/PACK) ×3 IMPLANT
TUBING ART PRESS 72  MALE/FEM (TUBING) ×4
TUBING ART PRESS 72 MALE/FEM (TUBING) ×4 IMPLANT
TUBING LAP HI FLOW INSUFFLATIO (TUBING) ×3 IMPLANT
UNDERPAD 30X30 (UNDERPADS AND DIAPERS) ×3 IMPLANT
WATER STERILE IRR 1000ML POUR (IV SOLUTION) ×6 IMPLANT
WATER STERILE IRR 1000ML UROMA (IV SOLUTION) IMPLANT

## 2019-11-20 NOTE — Progress Notes (Signed)
ANTICOAGULATION CONSULT NOTE   Pharmacy Consult for Heparin Indication: chest pain/ACS  No Known Allergies  Patient Measurements: Height: 5\' 6"  (167.6 cm) Weight: 70.8 kg (156 lb 1.4 oz) IBW/kg (Calculated) : 63.8 Heparin Dosing Weight: TBW  Vital Signs: Temp: 97.5 F (36.4 C) (04/16 0754) Temp Source: Oral (04/16 0400) BP: 100/64 (04/16 1000) Pulse Rate: 75 (04/16 1000)  Labs: Recent Labs     0000 11/17/19 1523 11/17/19 1822 11/17/19 2110 11/17/19 2322 11/18/19 0222 11/18/19 0222 11/19/19 0240 11/19/19 0827 11/20/19 0606  HGB  --  9.6*  --   --    < > 8.9*   < > 9.4*  --  8.9*  HCT  --  28.9*  --   --    < > 26.6*  --  28.5*  --  26.9*  PLT  --  409*  --   --    < > 365  --  353  --  345  HEPARINUNFRC  --   --   --   --    < > 0.38  --  0.32  --  0.68  CREATININE   < >  --   --   --   --  0.98  --   --  1.07 0.98  TROPONINIHS  --  1,824* 1,630* 1,794*  --   --   --   --   --   --    < > = values in this interval not displayed.    Estimated Creatinine Clearance: 52.4 mL/min (by C-G formula based on SCr of 0.98 mg/dL).   Assessment: 36 YOM presenting with chest pain, not on anticoagulation PTA. Patient went to cath lab 4/14; significant disease burden was found. Surgery consult was placed for possible CABG. Heparin restarted post cath.  Heparin level therapeutic (0.68) on gtt at 800 units/hr. Hbg 8.9 (low, stable), plt wnl. No bleeding noted.  Goal of Therapy:  Heparin level 0.3-0.7 units/ml Monitor platelets by anticoagulation protocol: Yes   Plan:  Reduce heparin to 700 units/hr F/u [facility] plan post-CABG  [facility], PharmD  PGY1 Acute Care Pharmacy Resident 11/20/2019 10:57 AM

## 2019-11-20 NOTE — Anesthesia Procedure Notes (Signed)
Procedure Name: Intubation Date/Time: 11/20/2019 2:41 PM Performed by: Eligha Bridegroom, CRNA Pre-anesthesia Checklist: Patient identified, Emergency Drugs available, Suction available, Patient being monitored and Timeout performed Patient Re-evaluated:Patient Re-evaluated prior to induction Oxygen Delivery Method: Circle system utilized Preoxygenation: Pre-oxygenation with 100% oxygen Induction Type: IV induction Ventilation: Mask ventilation without difficulty Laryngoscope Size: Mac and 4 Grade View: Grade II Tube type: Oral Tube size: 7.5 mm Number of attempts: 1 Airway Equipment and Method: Stylet Placement Confirmation: ETT inserted through vocal cords under direct vision,  positive ETCO2 and breath sounds checked- equal and bilateral Secured at: 21 cm Tube secured with: Tape Dental Injury: Teeth and Oropharynx as per pre-operative assessment

## 2019-11-20 NOTE — Anesthesia Procedure Notes (Signed)
Central Venous Catheter Insertion Performed by: Leonides Grills, MD, anesthesiologist Start/End4/16/2021 2:45 PM, 11/20/2019 3:00 PM Patient location: OR. Preanesthetic checklist: patient identified, IV checked, site marked, risks and benefits discussed, surgical consent, monitors and equipment checked, pre-op evaluation, timeout performed and anesthesia consent Position: Trendelenburg Lidocaine 1% used for infiltration Hand hygiene performed , maximum sterile barriers used  and Seldinger technique used Catheter size: 8.5 Fr Total catheter length 10. PA cath was placed.Sheath introducer Swan type:thermodilution PA Cath depth:53 Procedure performed using ultrasound guided technique. Ultrasound Notes:anatomy identified, needle tip was noted to be adjacent to the nerve/plexus identified and no ultrasound evidence of intravascular and/or intraneural injection Attempts: 1 Following insertion, line sutured, dressing applied and Biopatch. Post procedure assessment: blood return through all ports, free fluid flow and no air  Patient tolerated the procedure well with no immediate complications.

## 2019-11-20 NOTE — Brief Op Note (Signed)
11/20/2019  7:50 PM  PATIENT:  [name]  84 y.o. male  PRE-OPERATIVE DIAGNOSIS:  CAD, left main, s/p NSTEMI  POST-OPERATIVE DIAGNOSIS:  Same with left atrial appendage thrombus  PROCEDURE:  Procedure(s): CORONARY ARTERY BYPASS GRAFTING (CABG), ON PUMP, TIMES THREE, USING LEFT INTERNAL MAMMARY ARTERY AND ENDOSCOPICALLY HARVESTED RIGHT GREATER SAPHENOUS VEIN (N/A) TRANSESOPHAGEAL ECHOCARDIOGRAM (TEE) (N/A)  Removal of left atrial appendage clot and left atrial appendage clipping (45 mm)  SURGEON:  Surgeon(s) and Role:    * Linden Dolin, MD - Primary  PHYSICIAN ASSISTANT: Barrett, E PA-C  ASSISTANTS: staff   ANESTHESIA:   general  EBL: per anes record  BLOOD ADMINISTERED:1000 CC PRBC  DRAINS: 3 Chest Tube(s) in the mediastinum and bilateral pleural spaces   LOCAL MEDICATIONS USED:  NONE  SPECIMEN:  Source of Specimen:  left atrial appendage and thrombus  DISPOSITION OF SPECIMEN:  PATHOLOGY  COUNTS:  YES  TOURNIQUET:  * No tourniquets in log *  DICTATION: .Note written in EPIC  PLAN OF CARE: Admit to inpatient   PATIENT DISPOSITION:  ICU - intubated and critically ill.   Delay start of Pharmacological VTE agent (>24hrs) due to surgical blood loss or risk of bleeding: yes

## 2019-11-20 NOTE — Progress Notes (Signed)
Progress Note  Patient Name: [name] Date of Encounter: 11/20/2019  Primary Cardiologist: Lesleigh Noe, MD   Subjective   Mr. Houseman is feeling much better this morning.  He is not short of breath and he denies chest pain.  He is on IV heparin .  Postop day #2  left heart cath via left femoral approach revealing severe calcified left main disease, occluded left circumflex and high-grade mid RCA disease with severe LV dysfunction.  Also has an occluded right common iliac and moderate left iliac disease.  Patient denies chest pain or shortness of breath.  On IV heparin.  For CABG today.  Inpatient Medications    Scheduled Meds: . aspirin EC  81 mg Oral Daily  . bisacodyl  5 mg Oral Once  . Chlorhexidine Gluconate Cloth  6 each Topical Daily  . epinephrine  0-10 mcg/min Intravenous To OR  . feeding supplement (ENSURE ENLIVE)  237 mL Oral BID BM  . heparin-papaverine-plasmalyte irrigation   Irrigation To OR  . insulin   Intravenous To OR  . levalbuterol  0.63 mg Nebulization Once  . magnesium sulfate  40 mEq Other To OR  . metoprolol tartrate  12.5 mg Oral BID  . pantoprazole  40 mg Oral Q1200  . phenylephrine  30-200 mcg/min Intravenous To OR  . potassium chloride  80 mEq Other To OR  . rosuvastatin  10 mg Oral Daily  . sodium chloride flush  3 mL Intravenous Once  . sodium chloride flush  3 mL Intravenous Q12H  . sodium chloride flush  3 mL Intravenous Q12H  . sodium chloride flush  3 mL Intravenous Q12H  . tranexamic acid  15 mg/kg Intravenous To OR  . tranexamic acid  2 mg/kg Intracatheter To OR   Continuous Infusions: . sodium chloride    . sodium chloride    . cefUROXime (ZINACEF)  IV    . cefUROXime (ZINACEF)  IV    . dexmedetomidine    . heparin 30,000 units/NS 1000 mL solution for CELLSAVER    . heparin 800 Units/hr (11/20/19 0600)  . milrinone    . nitroGLYCERIN Stopped (11/18/19 1223)  . nitroGLYCERIN    . norepinephrine    . tranexamic acid  (CYKLOKAPRON) infusion ([facility])    . vancomycin     PRN Meds: sodium chloride, sodium chloride, acetaminophen, acetaminophen, ALPRAZolam, alum & mag hydroxide-simeth, magnesium hydroxide, nitroGLYCERIN, ondansetron (ZOFRAN) IV, ondansetron (ZOFRAN) IV, sodium chloride flush, sodium chloride flush, zolpidem   Vital Signs    Vitals:   11/20/19 0500 11/20/19 0600 11/20/19 0700 11/20/19 0754  BP: 96/68 104/66 (!) 106/56   Pulse: 91 83 88   Resp: 14 19 (!) 22   Temp:    (!) 97.5 F (36.4 C)  TempSrc:      SpO2: 97% 97% 95%   Weight: 70.8 kg     Height:        Intake/Output Summary (Last 24 hours) at 11/20/2019 0758 Last data filed at 11/20/2019 0600 Gross per 24 hour  Intake 790.52 ml  Output 2150 ml  Net -1359.48 ml   Last 3 Weights 11/20/2019 11/18/2019 11/17/2019  Weight (lbs) 156 lb 1.4 oz 163 lb 12.8 oz 160 lb 11.5 oz  Weight (kg) 70.8 kg 74.3 kg 72.9 kg      Telemetry    Normal sinus rhythm- Personally Reviewed  ECG    Normal sinus rhythm at 80 with early R wave transition and mild lateral ST  segment depression not significantly changed from prior EKG.- Personally Reviewed  Physical Exam   GEN: No acute distress.   Neck: No JVD Cardiac: RRR, no murmurs, rubs, or gallops.  Respiratory: Clear to auscultation bilaterally. GI: Soft, nontender, non-distended  MS: No edema; No deformity. Neuro:  Nonfocal  Psych: Normal affect   Labs    High Sensitivity Troponin:   Recent Labs  Lab 11/03/19 1649 11/17/19 1324 11/17/19 1523 11/17/19 1822 11/17/19 2110  TROPONINIHS 402* 1,867* 1,824* 1,630* 1,794*      Chemistry Recent Labs  Lab 11/18/19 0222 11/19/19 0827 11/20/19 0606  NA 135 132* 133*  K 4.5 4.2 4.1  CL 105 101 101  CO2 21* 23 22  GLUCOSE 115* 133* 100*  BUN 22 17 14   CREATININE 0.98 1.07 0.98  CALCIUM 8.0* 8.1* 8.3*  GFRNONAA >60 >60 >60  GFRAA >60 >60 >60  ANIONGAP 9 8 10      Hematology Recent Labs  Lab 11/18/19 0222 11/19/19 0240  11/20/19 0606  WBC 9.9 6.7 6.5  RBC 2.75* 2.96* 2.76*  HGB 8.9* 9.4* 8.9*  HCT 26.6* 28.5* 26.9*  MCV 96.7 96.3 97.5  MCH 32.4 31.8 32.2  MCHC 33.5 33.0 33.1  RDW 19.5* 19.6* 19.6*  PLT 365 353 345    BNP Recent Labs  Lab 11/17/19 1444  BNP 2,124.2*     DDimer No results for input(s): DDIMER in the last 168 hours.   Radiology    CARDIAC CATHETERIZATION  Result Date: 11/18/2019  Ost RCA to Prox RCA lesion is 70% stenosed.  Mid RCA lesion is 75% stenosed.  Ost Cx to Prox Cx lesion is 100% stenosed.  Ost LM to Mid LM lesion is 75% stenosed.  Dist LM to Prox LAD lesion is 90% stenosed.  Mid LAD lesion is 25% stenosed.  There is moderate left ventricular systolic dysfunction.  LV end diastolic pressure is moderately elevated.  The left ventricular ejection fraction is 35-45% by visual estimate.  There is no aortic valve stenosis.  Severe three vessel disease including heavily calcified left main and proximal LAD.  Aortic atherosclerosis with no AAA.  Occluded right common iliac artery. 50% lesion in the left external artery. Discussed with Dr. 11/19/19.  Will obtain surgical consult for possible LIMA to LAD.  IV Lasix ordered.  Restart heparin 8 hours post sheath pull.   PERIPHERAL VASCULAR CATHETERIZATION  Result Date: 11/18/2019  Ost RCA to Prox RCA lesion is 70% stenosed.  Mid RCA lesion is 75% stenosed.  Ost Cx to Prox Cx lesion is 100% stenosed.  Ost LM to Mid LM lesion is 75% stenosed.  Dist LM to Prox LAD lesion is 90% stenosed.  Mid LAD lesion is 25% stenosed.  There is moderate left ventricular systolic dysfunction.  LV end diastolic pressure is moderately elevated.  The left ventricular ejection fraction is 35-45% by visual estimate.  There is no aortic valve stenosis.  Severe three vessel disease including heavily calcified left main and proximal LAD.  Aortic atherosclerosis with no AAA.  Occluded right common iliac artery. 50% lesion in the left external artery.  Discussed with Dr. Allyson Sabal.  Will obtain surgical consult for possible LIMA to LAD.  IV Lasix ordered.  Restart heparin 8 hours post sheath pull.   ECHOCARDIOGRAM COMPLETE  Result Date: 11/19/2019    ECHOCARDIOGRAM REPORT   Patient Name:   [name] STUMPH Date of Exam: 11/19/2019 Medical Rec #:  [name]       Height:  66.0 in Accession #:    [id number]      Weight:       163.8 lb Date of Birth:  1936/08/06       BSA:          1.837 m Patient Age:    65 years        BP:           88/67 mmHg Patient Gender: M               HR:           80 bpm. Exam Location:  Inpatient Procedure: 2D Echo and Intracardiac Opacification Agent Indications:    NSTEMI I21.4  History:        Patient has prior history of Echocardiogram examinations, most                 recent 11/03/2019. Stroke. PVD                 Elevated troponin.  Sonographer:    Vikki Ports Turrentine Referring Phys: [id number] DUKGURK Z ATKINS  Sonographer Comments: Suboptimal subcostal window. IMPRESSIONS  1. Since the last study on 11/03/2019 LVEF has decreased from 60-65% to 35-40% with global hypokinesis and akinesis in the basal and mid inferior and inferolateral walls. There is heavy smoke in the left ventricular apex consistent with pre-thrombotic state. A systemic anticoagulation should be considered.  2. Left ventricular ejection fraction, by estimation, is 35 to 40%. The left ventricle has moderately decreased function. The left ventricle demonstrates regional wall motion abnormalities (see scoring diagram/findings for description). There is mild concentric left ventricular hypertrophy. Left ventricular diastolic parameters are consistent with Grade II diastolic dysfunction (pseudonormalization). Elevated left atrial pressure.  3. Right ventricular systolic function is normal. The right ventricular size is normal.  4. Left atrial size was mildly dilated.  5. Large pleural effusion in the left lateral region.  6. The mitral valve is normal in structure.  Moderate mitral valve regurgitation. No evidence of mitral stenosis.  7. The aortic valve is normal in structure. Aortic valve regurgitation is not visualized. Mild to moderate aortic valve sclerosis/calcification is present, without any evidence of aortic stenosis.  8. The inferior vena cava is normal in size with greater than 50% respiratory variability, suggesting right atrial pressure of 3 mmHg. FINDINGS  Left Ventricle: Left ventricular ejection fraction, by estimation, is 35 to 40%. The left ventricle has moderately decreased function. The left ventricle demonstrates regional wall motion abnormalities. Definity contrast agent was given IV to delineate the left ventricular endocardial borders. The left ventricular internal cavity size was normal in size. There is mild concentric left ventricular hypertrophy. Left ventricular diastolic parameters are consistent with Grade II diastolic dysfunction (pseudonormalization). Elevated left atrial pressure.  LV Wall Scoring: The inferior wall, posterior wall, and basal inferoseptal segment are akinetic. Right Ventricle: The right ventricular size is normal. No increase in right ventricular wall thickness. Right ventricular systolic function is normal. Left Atrium: Left atrial size was mildly dilated. Right Atrium: Right atrial size was normal in size. Pericardium: There is no evidence of pericardial effusion. Mitral Valve: The mitral valve is normal in structure. Normal mobility of the mitral valve leaflets. Moderate mitral valve regurgitation, with posteriorly-directed jet. No evidence of mitral valve stenosis. Tricuspid Valve: The tricuspid valve is normal in structure. Tricuspid valve regurgitation is mild . No evidence of tricuspid stenosis. Aortic Valve: The aortic valve is normal in structure. Aortic valve regurgitation is not visualized.  Mild to moderate aortic valve sclerosis/calcification is present, without any evidence of aortic stenosis. There is moderate  calcification of the aortic valve. Pulmonic Valve: The pulmonic valve was normal in structure. Pulmonic valve regurgitation is not visualized. No evidence of pulmonic stenosis. Aorta: The aortic root is normal in size and structure. Venous: The inferior vena cava is normal in size with greater than 50% respiratory variability, suggesting right atrial pressure of 3 mmHg. IAS/Shunts: No atrial level shunt detected by color flow Doppler. Additional Comments: There is a large pleural effusion in the left lateral region.  LEFT VENTRICLE PLAX 2D LVIDd:         4.20 cm  Diastology LVIDs:         3.30 cm  LV e' lateral:   7.97 cm/s LV PW:         1.00 cm  LV E/e' lateral: 12.9 LV IVS:        1.10 cm  LV e' medial:    6.31 cm/s LVOT diam:     2.00 cm  LV E/e' medial:  16.3 LV SV:         48 LV SV Index:   26 LVOT Area:     3.14 cm  RIGHT VENTRICLE RV S prime:     11.10 cm/s TAPSE (M-mode): 2.0 cm LEFT ATRIUM             Index       RIGHT ATRIUM           Index LA diam:        4.30 cm 2.34 cm/m  RA Area:     17.50 cm LA Vol (A2C):   73.8 ml 40.17 ml/m RA Volume:   49.30 ml  26.84 ml/m LA Vol (A4C):   75.0 ml 40.82 ml/m LA Biplane Vol: 77.6 ml 42.24 ml/m  AORTIC VALVE LVOT Vmax:   93.30 cm/s LVOT Vmean:  59.200 cm/s LVOT VTI:    0.152 m  AORTA Ao Root diam: 3.00 cm MITRAL VALVE MV Area (PHT): 4.31 cm     SHUNTS MV Decel Time: 176 msec     Systemic VTI:  0.15 m MV E velocity: 103.00 cm/s  Systemic Diam: 2.00 cm MV A velocity: 36.40 cm/s MV E/A ratio:  2.83 Tobias Alexander MD Electronically signed by Tobias Alexander MD Signature Date/Time: 11/19/2019/10:40:40 PM    Final    VAS US DOPPLER PRE CABG  Result Date: 11/19/2019 PREOPERATIVE VASCULAR EVALUATION  Indications:      Pre-CABG. Risk Factors:     Hypertension, prior CVA, PAD. Limitations:      Patient positioning, respiratory variation, vessel movement,                   vessel calcification, right arm immobile with brace and                   bandages Comparison  Study: 02/20/2019 - ABI's R 0.44 L 0.94 Performing Technologist: Olen Cordial RVT  Examination Guidelines: A complete evaluation includes B-mode imaging, spectral Doppler, color Doppler, and power Doppler as needed of all accessible portions of each vessel. Bilateral testing is considered an integral part of a complete examination. Limited examinations for reoccurring indications may be performed as noted.  Right Carotid Findings: +----------+--------+--------+--------+-----------------------+--------+           PSV cm/sEDV cm/sStenosisDescribe               Comments +----------+--------+--------+--------+-----------------------+--------+ CCA Prox  67  12              smooth and heterogenoustortuous +----------+--------+--------+--------+-----------------------+--------+ CCA Distal71      17              calcific                        +----------+--------+--------+--------+-----------------------+--------+ ICA Prox  62      25              calcific                        +----------+--------+--------+--------+-----------------------+--------+ ICA Distal56      22                                     tortuous +----------+--------+--------+--------+-----------------------+--------+ ECA       62      13                                              +----------+--------+--------+--------+-----------------------+--------+ Portions of this table do not appear on this page. +----------+--------+-------+--------+------------+           PSV cm/sEDV cmsDescribeArm Pressure +----------+--------+-------+--------+------------+ Subclavian76                                  +----------+--------+-------+--------+------------+ +---------+--------+--+--------+--+---------+ VertebralPSV cm/s73EDV cm/s27Antegrade +---------+--------+--+--------+--+---------+ Left Carotid Findings: +----------+--------+--------+--------+--------------------------+--------+           PSV  cm/sEDV cm/sStenosisDescribe                  Comments +----------+--------+--------+--------+--------------------------+--------+ CCA Prox  103     30              irregular and heterogenous         +----------+--------+--------+--------+--------------------------+--------+ CCA Distal58      25              irregular and heterogenous         +----------+--------+--------+--------+--------------------------+--------+ ICA Prox  65      22              calcific                           +----------+--------+--------+--------+--------------------------+--------+ ICA Distal56      25                                                 +----------+--------+--------+--------+--------------------------+--------+ ECA       35                                                         +----------+--------+--------+--------+--------------------------+--------+ +----------+--------+--------+--------+------------+ SubclavianPSV cm/sEDV cm/sDescribeArm Pressure +----------+--------+--------+--------+------------+           75                      92           +----------+--------+--------+--------+------------+ +---------+--------+--+--------+--+---------+  VertebralPSV cm/s38EDV cm/s14Antegrade +---------+--------+--+--------+--+---------+  ABI Findings: +-----+------------------+-----+----------+--------+ RightRt Pressure (mmHg)IndexWaveform  Comment  +-----+------------------+-----+----------+--------+ PTA  61                0.66 monophasic         +-----+------------------+-----+----------+--------+ DP   56                0.61 monophasic         +-----+------------------+-----+----------+--------+ +--------+------------------+-----+---------+-------+ Left    Lt Pressure (mmHg)IndexWaveform Comment +--------+------------------+-----+---------+-------+ Brachial92                     triphasic         +--------+------------------+-----+---------+-------+ PTA     99                1.08 biphasic         +--------+------------------+-----+---------+-------+ DP      96                1.04 biphasic         +--------+------------------+-----+---------+-------+ +-------+---------------+----------------+ ABI/TBIToday's ABI/TBIPrevious ABI/TBI +-------+---------------+----------------+ Right  0.66           0.44             +-------+---------------+----------------+ Left   1.08           0.94             +-------+---------------+----------------+  Left Doppler Findings: +--------+--------+-----+---------+--------+ Site    PressureIndexDoppler  Comments +--------+--------+-----+---------+--------+ [id number]           triphasic         +--------+--------+-----+---------+--------+ Radial               triphasic         +--------+--------+-----+---------+--------+ Ulnar                triphasic         +--------+--------+-----+---------+--------+ Technologist Notes: Unable to insonate due to patient immobility, positioning, bandages, and arm brace.  Summary: Right Carotid: Velocities in the right ICA are consistent with a 1-39% stenosis. Left Carotid: Velocities in the left ICA are consistent with a 1-39% stenosis. Vertebrals: Bilateral vertebral arteries demonstrate antegrade flow. Right ABI: Resting right ankle-brachial index indicates moderate right lower extremity arterial disease. Left ABI: Resting left ankle-brachial index is within normal range. No evidence of significant left lower extremity arterial disease. Left Upper Extremity: Doppler waveform obliterate with left radial compression. Doppler waveform obliterate with left ulnar compression.     Preliminary     Cardiac Studies   Left heart cath (11/18/2019)  Conclusion    Ost RCA to Prox RCA lesion is 70% stenosed.  Mid RCA lesion is 75% stenosed.  Ost Cx to Prox Cx lesion is 100% stenosed.  Ost LM to  Mid LM lesion is 75% stenosed.  Dist LM to Prox LAD lesion is 90% stenosed.  Mid LAD lesion is 25% stenosed.  There is moderate left ventricular systolic dysfunction.  LV end diastolic pressure is moderately elevated.  The left ventricular ejection fraction is 35-45% by visual estimate.  There is no aortic valve stenosis.   Severe three vessel disease including heavily calcified left main and proximal LAD.    Aortic atherosclerosis with no AAA.    Occluded right common iliac artery. 50% lesion in the left external artery.  Discussed with Dr. Allyson Sabal.  Will obtain surgical consult for possible LIMA to LAD.  IV Lasix ordered.  Restart heparin 8 hours post sheath pull.  Coronary Diagrams  Diagnostic Dominance: Right      Patient Profile     JAYON MATTON is a 84 y.o. male with pmh of HTN, HLD, CVA in 2001 in right sided weakness, prediabetes, B/L carotid artery disease (1-39%), PAD, RBBB, sinus bradycardia who was admitted yesterday with chest pain and shortness of breath.  His enzymes were positive and his BNP was elevated.  He had pulmonary edema on chest x-ray.  He did have subtle EKG changes.  He underwent left heart cath yesterday by Dr. Eldridge Dace revealing severe left main/three-vessel disease with moderately severe LV dysfunction.  Visually his EF appeared to be in the 30 to 35% range.  Cardiothoracic surgery was with was asked to consult who agreed that CABG was the best alternative.  Patient scheduled for that today.  Assessment & Plan    1: Non-STEMI-troponin rose to 1700.  He no longer has chest pain on IV nitroglycerin.  He does have subtle ST segment depression laterally.  Mr. Cavins underwent left heart cath via the left femoral approach by Dr. Eldridge Dace on Wednesday.  He has severely calcified high-grade left main disease, occluded circumflex and high-grade mid RCA stenosis provides collaterals to the circumflex.  He has surgical anatomy.  Dr. Vickey Sages was asked to  consult, who felt that he was an acceptable CABG candidate and he scheduled to have that performed today.  2: Essential hypertension-blood pressure 106/56 today.  His Cardizem and hydrochlorothiazide lisinopril have been placed on hold.  He is on low-dose beta-blocker.  3: Hyperlipidemia-on statin therapy  4: Acute pulmonary edema-BNP was elevated.  Chest x-ray consistent with pulmonary edema.  He was diuresed 2.6  L and feels clinically improved.  He currently appears euvolemic.  He no longer is on IV Lasix his lungs are clear.  Is no peripheral edema.  5: Ischemic cardiomyopathy-EF in the 35% range with an anteroapical wall motion abnormality.  I suspect this represents "stunned myocardium" considering that his troponins did not elevate significantly compared to his wall motion normality.  I suspect his LV function will improve with revascularization.   Clinically improved with diuresis, on IV heparin..  Cardiac cath revealed left main/three-vessel disease with surgical anatomy.  He was evaluated by Dr. Vickey Sages from [facility] who felt that he was an acceptable CABG candidate.  He is remained stable, pain-free on IV heparin.  Plan is for CABG this morning.  For questions or updates, please contact [facility] Please consult [email or web address] for contact info under        Signed, Nanetta Batty, MD  11/20/2019, 7:58 AM

## 2019-11-20 NOTE — Progress Notes (Signed)
Niece [name] at bedside. Report called to the OR. Patient bathed and clipped per orders. VSS.  Delories Heinz, RN

## 2019-11-20 NOTE — Brief Op Note (Signed)
11/17/2019 - 11/20/2019  7:32 AM  PATIENT:  [name]  84 y.o. male  PRE-OPERATIVE DIAGNOSIS:  CAD  POST-OPERATIVE DIAGNOSIS:  * No post-op diagnosis entered *  PROCEDURE:  Procedure(s):  CORONARY ARTERY BYPASS GRAFTING x 3 -LIMA to LAD -SVG to DIAGONAL -SVG to RCA  ENDOSCOPIC HARVEST GREATER SAPHENOUS VEIN -Right Thigh  LIGATION/CLIPPING OF LEFT ATRIAL APPENDAGE -Oversew -Clipping of Appendage with Atricure 45 Clip  TRANSESOPHAGEAL ECHOCARDIOGRAM (TEE) (N/A)  SURGEON:  Surgeon(s) and Role:    * Linden Dolin, MD - Primary  PHYSICIAN ASSISTANT: Lowella Dandy PA-C  ANESTHESIA:   general  EBL:  Per anesthesia record  BLOOD ADMINISTERED: 2U PRBC,  CELLSAVER and  PLTS  DRAINS: Left and Right Pleural Chest Tubes, Mediastinal Chest Drains   LOCAL MEDICATIONS USED:  BUPIVICAINE   SPECIMEN:  Source of Specimen:  Atrial Appendage Thrombus, Atrial Appendage  DISPOSITION OF SPECIMEN:  PATHOLOGY  COUNTS:  YES  TOURNIQUET:  * No tourniquets in log *  DICTATION: .Dragon Dictation  PLAN OF CARE: Admit to inpatient   PATIENT DISPOSITION:  ICU - intubated and hemodynamically stable.   Delay start of Pharmacological VTE agent (>24hrs) due to surgical blood loss or risk of bleeding: yes

## 2019-11-20 NOTE — Transfer of Care (Signed)
Immediate Anesthesia Transfer of Care Note  Patient: [name]  Procedure(s) Performed: CORONARY ARTERY BYPASS GRAFTING (CABG), ON PUMP, TIMES THREE, USING LEFT INTERNAL MAMMARY ARTERY AND ENDOSCOPICALLY HARVESTED RIGHT GREATER SAPHENOUS VEIN (N/A Chest) TRANSESOPHAGEAL ECHOCARDIOGRAM (TEE) (N/A )  Patient Location: ICU  Anesthesia Type:General  Level of Consciousness: sedated, unresponsive and Patient remains intubated per anesthesia plan  Airway & Oxygen Therapy: Patient remains intubated per anesthesia plan and Patient placed on Ventilator (see vital sign flow sheet for setting)  Post-op Assessment: Report given to RN and Post -op Vital signs reviewed and stable  Post vital signs: Reviewed and stable  Last Vitals:  Vitals Value Taken Time  BP 102/70 11/20/19 1941  Temp 36.6 C 11/20/19 1955  Pulse 103 11/20/19 1955  Resp 12 11/20/19 1955  SpO2 94 % 11/20/19 1955  Vitals shown include unvalidated device data.  Last Pain:  Vitals:   11/20/19 0400  TempSrc: Oral  PainSc:          Complications: No apparent anesthesia complications

## 2019-11-20 NOTE — Progress Notes (Signed)
Pt wrist brace at bedside. Pt transported to OR waiting, VSS.  Delories Heinz, RN

## 2019-11-20 NOTE — H&P (Signed)
History and Physical Interval Note:  11/20/2019 1:54 PM  [name]  has presented today for surgery, with the diagnosis of CAD.  The various methods of treatment have been discussed with the patient and family. After consideration of risks, benefits and other options for treatment, the patient has consented to  Procedure(s): CORONARY ARTERY BYPASS GRAFTING (CABG) (N/A) TRANSESOPHAGEAL ECHOCARDIOGRAM (TEE) (N/A) INDOCYANINE GREEN FLUORESCENCE IMAGING (ICG) (N/A) as a surgical intervention.  The patient's history has been reviewed, patient examined, no change in status, stable for surgery.  I have reviewed the patient's chart and labs.  Questions were answered to the patient's satisfaction.     Linden Dolin

## 2019-11-20 NOTE — Anesthesia Preprocedure Evaluation (Addendum)
Anesthesia Evaluation  Patient identified by MRN, date of birth, ID bandGeneral Assessment Comment:Difficulty with sppech  Reviewed: Allergy & Precautions, NPO status , Patient's Chart, lab work & pertinent test results  Airway Mallampati: III  TM Distance: >3 FB Neck ROM: Full    Dental  (+) Missing   Pulmonary neg pulmonary ROS,    Pulmonary exam normal breath sounds clear to auscultation       Cardiovascular hypertension, Pt. on medications + CAD, + Past MI and + Peripheral Vascular Disease  Normal cardiovascular exam Rhythm:Regular Rate:Normal  ECG: NSR, rate 80  ECHO: 1. Since the last study on 11/03/2019 LVEF has decreased from 60-65% to 35-40% with global hypokinesis and akinesis in the basal and mid inferior and inferolateral walls. There is heavy smoke in the left ventricular apex consistent with pre-thrombotic state. A systemic anticoagulation should be considered. 2. Left ventricular ejection fraction, by estimation, is 35 to 40%. The left ventricle has moderately decreased function. The left ventricle demonstrates regional wall motion abnormalities (see scoring diagram/findings for description). There is mild concentric left ventricular hypertrophy. Left ventricular diastolic parameters are consistent with Grade II diastolic dysfunction (pseudonormalization). Elevated left atrial pressure. 3. Right ventricular systolic function is normal. The right ventricular size is normal. 4. Left atrial size was mildly dilated. 5. Large pleural effusion in the left lateral region. 6. The mitral valve is normal in structure. Moderate mitral valve regurgitation. No evidence of mitral stenosis. 7. The aortic valve is normal in structure. Aortic valve regurgitation is not visualized. Mild to moderate aortic valve sclerosis/calcification is present, without any evidence of aortic stenosis. 8. The inferior vena cava is normal in size with  greater than 50% respiratory variability, suggesting right atrial pressure of 3 mmHg.  CATH: Ost RCA to Prox RCA lesion is 70% stenosed. Mid RCA lesion is 75% stenosed. Ost Cx to Prox Cx lesion is 100% stenosed. Ost LM to Mid LM lesion is 75% stenosed. Dist LM to Prox LAD lesion is 90% stenosed. Mid LAD lesion is 25% stenosed. There is moderate left ventricular systolic dysfunction. LV end diastolic pressure is moderately elevated. The left ventricular ejection fraction is 35-45% by visual estimate. There is no aortic valve stenosis.  Severe three vessel disease including heavily calcified left main and proximal LAD.    Neuro/Psych 2001 - Right side deficits CVA, Residual Symptoms negative psych ROS   GI/Hepatic negative GI ROS, Neg liver ROS,   Endo/Other  negative endocrine ROS  Renal/GU negative Renal ROS     Musculoskeletal negative musculoskeletal ROS (+)   Abdominal   Peds  Hematology  (+) anemia , HLD   Anesthesia Other Findings CAD  Reproductive/Obstetrics                          Anesthesia Physical Anesthesia Plan  ASA: IV  Anesthesia Plan: General   Post-op Pain Management:    Induction: Intravenous  PONV Risk Score and Plan: 2 and Ondansetron, Dexamethasone, Midazolam and Treatment may vary due to age or medical condition  Airway Management Planned: Oral ETT  Additional Equipment: Arterial line, CVP, PA Cath, TEE and Ultrasound Guidance Line Placement  Intra-op Plan:   Post-operative Plan: Post-operative intubation/ventilation  Informed Consent: I have reviewed the patients History and Physical, chart, labs and discussed the procedure including the risks, benefits and alternatives for the proposed anesthesia with the patient or authorized representative who has indicated his/her understanding and acceptance.     Dental  advisory given and Consent reviewed with POA  Plan Discussed with: CRNA  Anesthesia Plan  Comments: (Anesthetic plan discussed with neice)      Anesthesia Quick Evaluation

## 2019-11-20 NOTE — Progress Notes (Signed)
  Echocardiogram Echocardiogram Transesophageal has been performed.  Delcie Roch 11/20/2019, 3:25 PM

## 2019-11-20 NOTE — Chronic Care Management (AMB) (Signed)
  Chronic Care Management   Inpatient Admit Review Note  11/20/2019 Name: MINA CARLISI MRN: [id number] DOB: [birth year]  [name] is a 84 y.o. year old male who is a primary care patient of [name], FNP. [name] is actively engaged with the embedded care management team in the primary care practice and is being followed by RN Case Manager and BSW for assistance with disease management and care coordination needs related to HTN.   [name] is currently admitted to the hospital for evaluation and treatment of NSTEMI. Current treatment plan is described by Dr. Erlene Quan (excerpt below)   Assessment & Plan    1: Non-STEMI-troponin rose to 1700.  He no longer has chest pain on IV nitroglycerin.  He does have subtle ST segment depression laterally.  Mr. Maino underwent left heart cath via the left femoral approach by Dr. Eldridge Dace on Wednesday.  He has severely calcified high-grade left main disease, occluded circumflex and high-grade mid RCA stenosis provides collaterals to the circumflex.  He has surgical anatomy.  Dr. Vickey Sages was asked to consult, who felt that he was an acceptable CABG candidate and he scheduled to have that performed today.  2: Essential hypertension-blood pressure 106/56 today.  His Cardizem and hydrochlorothiazide lisinopril have been placed on hold.  He is on low-dose beta-blocker.  3: Hyperlipidemia-on statin therapy  4: Acute pulmonary edema-BNP was elevated.  Chest x-ray consistent with pulmonary edema.  He was diuresed 2.6  L and feels clinically improved.  He currently appears euvolemic.  He no longer is on IV Lasix his lungs are clear.  Is no peripheral edema.  5: Ischemic cardiomyopathy-EF in the 35% range with an anteroapical wall motion abnormality.  I suspect this represents "stunned myocardium" considering that his troponins did not elevate significantly compared to his wall motion normality.  I suspect his LV function will improve with  revascularization.   Clinically improved with diuresis, on IV heparin..  Cardiac cath revealed left main/three-vessel disease with surgical anatomy.  He was evaluated by Dr. Vickey Sages from [facility] who felt that he was an acceptable CABG candidate.  He is remained stable, pain-free on IV heparin.  Plan is for CABG this morning.  Plan: Collaboration with RN Care Manager regarding patient disposition. Care Management team will continue to follow with plans to engage upon discharge.   Bevelyn Ngo, BSW, CDP Social Worker, Certified Dementia Practitioner [facility] Care Management [phone]

## 2019-11-21 ENCOUNTER — Inpatient Hospital Stay (HOSPITAL_COMMUNITY): Payer: Medicare PPO

## 2019-11-21 LAB — COMPREHENSIVE METABOLIC PANEL
ALT: 23 U/L (ref 0–44)
AST: 30 U/L (ref 15–41)
Albumin: 3.2 g/dL — ABNORMAL LOW (ref 3.5–5.0)
Alkaline Phosphatase: 40 U/L (ref 38–126)
Anion gap: 7 (ref 5–15)
BUN: 14 mg/dL (ref 8–23)
CO2: 23 mmol/L (ref 22–32)
Calcium: 7.6 mg/dL — ABNORMAL LOW (ref 8.9–10.3)
Chloride: 110 mmol/L (ref 98–111)
Creatinine, Ser: 1.11 mg/dL (ref 0.61–1.24)
GFR calc Af Amer: >60 mL/min (ref >60)
GFR calc non Af Amer: >60 mL/min (ref >60)
Glucose, Bld: 121 mg/dL — ABNORMAL HIGH (ref 70–99)
Potassium: 3.9 mmol/L (ref 3.5–5.1)
Sodium: 140 mmol/L (ref 135–145)
Total Bilirubin: 1.9 mg/dL — ABNORMAL HIGH (ref 0.3–1.2)
Total Protein: 5 g/dL — ABNORMAL LOW (ref 6.5–8.1)

## 2019-11-21 LAB — POCT I-STAT 7, (LYTES, BLD GAS, ICA,H+H)
Acid-Base Excess: 3 mmol/L — ABNORMAL HIGH (ref 0.0–2.0)
Acid-Base Excess: 3 mmol/L — ABNORMAL HIGH (ref 0.0–2.0)
Acid-base deficit: 6 mmol/L — ABNORMAL HIGH (ref 0.0–2.0)
Acid-base deficit: 1 mmol/L (ref 0.0–2.0)
Bicarbonate: 21.7 mmol/L (ref 20.0–28.0)
Bicarbonate: 24.5 mmol/L (ref 20.0–28.0)
Bicarbonate: 27.7 mmol/L (ref 20.0–28.0)
Bicarbonate: 28.2 mmol/L — ABNORMAL HIGH (ref 20.0–28.0)
Calcium, Ion: 1.09 mmol/L — ABNORMAL LOW (ref 1.15–1.40)
Calcium, Ion: 1.13 mmol/L — ABNORMAL LOW (ref 1.15–1.40)
Calcium, Ion: 1.13 mmol/L — ABNORMAL LOW (ref 1.15–1.40)
Calcium, Ion: 1.12 mmol/L — ABNORMAL LOW (ref 1.15–1.40)
HCT: 31 % — ABNORMAL LOW (ref 39.0–52.0)
HCT: 31 % — ABNORMAL LOW (ref 39.0–52.0)
HCT: 29 % — ABNORMAL LOW (ref 39.0–52.0)
HCT: 33 % — ABNORMAL LOW (ref 39.0–52.0)
Hemoglobin: 10.5 g/dL — ABNORMAL LOW (ref 13.0–17.0)
Hemoglobin: 10.5 g/dL — ABNORMAL LOW (ref 13.0–17.0)
Hemoglobin: 9.9 g/dL — ABNORMAL LOW (ref 13.0–17.0)
Hemoglobin: 11.2 g/dL — ABNORMAL LOW (ref 13.0–17.0)
O2 Saturation: 99 %
O2 Saturation: 100 %
O2 Saturation: 100 %
O2 Saturation: 99 %
Patient temperature: 36.2
Patient temperature: 37
Patient temperature: 36.9
Patient temperature: 36.8
Potassium: 3.8 mmol/L (ref 3.5–5.1)
Potassium: 3.7 mmol/L (ref 3.5–5.1)
Potassium: 4.3 mmol/L (ref 3.5–5.1)
Potassium: 4.3 mmol/L (ref 3.5–5.1)
Sodium: 141 mmol/L (ref 135–145)
Sodium: 141 mmol/L (ref 135–145)
Sodium: 143 mmol/L (ref 135–145)
Sodium: 144 mmol/L (ref 135–145)
TCO2: 23 mmol/L (ref 22–32)
TCO2: 26 mmol/L (ref 22–32)
TCO2: 29 mmol/L (ref 22–32)
TCO2: 30 mmol/L (ref 22–32)
pCO2 arterial: 48.2 mmHg — ABNORMAL HIGH (ref 32.0–48.0)
pCO2 arterial: 41.6 mmHg (ref 32.0–48.0)
pCO2 arterial: 40.9 mmHg (ref 32.0–48.0)
pCO2 arterial: 42.7 mmHg (ref 32.0–48.0)
pH, Arterial: 7.258 — ABNORMAL LOW (ref 7.350–7.450)
pH, Arterial: 7.378 (ref 7.350–7.450)
pH, Arterial: 7.439 (ref 7.350–7.450)
pH, Arterial: 7.428 (ref 7.350–7.450)
pO2, Arterial: 176 mmHg — ABNORMAL HIGH (ref 83.0–108.0)
pO2, Arterial: 201 mmHg — ABNORMAL HIGH (ref 83.0–108.0)
pO2, Arterial: 201 mmHg — ABNORMAL HIGH (ref 83.0–108.0)
pO2, Arterial: 150 mmHg — ABNORMAL HIGH (ref 83.0–108.0)

## 2019-11-21 LAB — PREPARE RBC (CROSSMATCH)

## 2019-11-21 LAB — COOXEMETRY PANEL
Carboxyhemoglobin: 1.5 % (ref 0.5–1.5)
Methemoglobin: 1.2 % (ref 0.0–1.5)
O2 Saturation: 78.2 %
Total hemoglobin: 11.9 g/dL — ABNORMAL LOW (ref 12.0–16.0)

## 2019-11-21 LAB — GLUCOSE, CAPILLARY
Glucose-Capillary: 122 mg/dL — ABNORMAL HIGH (ref 70–99)
Glucose-Capillary: 122 mg/dL — ABNORMAL HIGH (ref 70–99)
Glucose-Capillary: 124 mg/dL — ABNORMAL HIGH (ref 70–99)
Glucose-Capillary: 131 mg/dL — ABNORMAL HIGH (ref 70–99)
Glucose-Capillary: 140 mg/dL — ABNORMAL HIGH (ref 70–99)
Glucose-Capillary: 141 mg/dL — ABNORMAL HIGH (ref 70–99)
Glucose-Capillary: 143 mg/dL — ABNORMAL HIGH (ref 70–99)
Glucose-Capillary: 157 mg/dL — ABNORMAL HIGH (ref 70–99)
Glucose-Capillary: 172 mg/dL — ABNORMAL HIGH (ref 70–99)
Glucose-Capillary: 181 mg/dL — ABNORMAL HIGH (ref 70–99)
Glucose-Capillary: 185 mg/dL — ABNORMAL HIGH (ref 70–99)
Glucose-Capillary: 193 mg/dL — ABNORMAL HIGH (ref 70–99)
Glucose-Capillary: 207 mg/dL — ABNORMAL HIGH (ref 70–99)

## 2019-11-21 LAB — BASIC METABOLIC PANEL
Anion gap: 11 (ref 5–15)
BUN: 13 mg/dL (ref 8–23)
CO2: 23 mmol/L (ref 22–32)
Calcium: 7.8 mg/dL — ABNORMAL LOW (ref 8.9–10.3)
Chloride: 109 mmol/L (ref 98–111)
Creatinine, Ser: 1.25 mg/dL — ABNORMAL HIGH (ref 0.61–1.24)
GFR calc Af Amer: >60 mL/min (ref >60)
GFR calc non Af Amer: 53 mL/min — ABNORMAL LOW (ref >60)
Glucose, Bld: 213 mg/dL — ABNORMAL HIGH (ref 70–99)
Potassium: 3.7 mmol/L (ref 3.5–5.1)
Sodium: 143 mmol/L (ref 135–145)

## 2019-11-21 LAB — CBC
HCT: 39.6 % (ref 39.0–52.0)
HCT: 29.1 % — ABNORMAL LOW (ref 39.0–52.0)
HCT: 31.9 % — ABNORMAL LOW (ref 39.0–52.0)
HCT: 35.8 % — ABNORMAL LOW (ref 39.0–52.0)
Hemoglobin: 13.2 g/dL (ref 13.0–17.0)
Hemoglobin: 9.7 g/dL — ABNORMAL LOW (ref 13.0–17.0)
Hemoglobin: 10.6 g/dL — ABNORMAL LOW (ref 13.0–17.0)
Hemoglobin: 12.3 g/dL — ABNORMAL LOW (ref 13.0–17.0)
MCH: 30.3 pg (ref 26.0–34.0)
MCH: 30.6 pg (ref 26.0–34.0)
MCH: 29.9 pg (ref 26.0–34.0)
MCH: 30.4 pg (ref 26.0–34.0)
MCHC: 33.3 g/dL (ref 30.0–36.0)
MCHC: 33.3 g/dL (ref 30.0–36.0)
MCHC: 33.2 g/dL (ref 30.0–36.0)
MCHC: 34.4 g/dL (ref 30.0–36.0)
MCV: 91 fL (ref 80.0–100.0)
MCV: 91.8 fL (ref 80.0–100.0)
MCV: 90.1 fL (ref 80.0–100.0)
MCV: 88.6 fL (ref 80.0–100.0)
Platelets: 153 10*3/uL (ref 150–400)
Platelets: 135 10*3/uL — ABNORMAL LOW (ref 150–400)
Platelets: 210 10*3/uL (ref 150–400)
Platelets: 160 10*3/uL (ref 150–400)
RBC: 4.35 MIL/uL (ref 4.22–5.81)
RBC: 3.17 MIL/uL — ABNORMAL LOW (ref 4.22–5.81)
RBC: 3.54 MIL/uL — ABNORMAL LOW (ref 4.22–5.81)
RBC: 4.04 MIL/uL — ABNORMAL LOW (ref 4.22–5.81)
RDW: 18.3 % — ABNORMAL HIGH (ref 11.5–15.5)
RDW: 18.4 % — ABNORMAL HIGH (ref 11.5–15.5)
RDW: 18.1 % — ABNORMAL HIGH (ref 11.5–15.5)
RDW: 18 % — ABNORMAL HIGH (ref 11.5–15.5)
WBC: 16.5 10*3/uL — ABNORMAL HIGH (ref 4.0–10.5)
WBC: 13.4 10*3/uL — ABNORMAL HIGH (ref 4.0–10.5)
WBC: 10.3 10*3/uL (ref 4.0–10.5)
WBC: 12.9 10*3/uL — ABNORMAL HIGH (ref 4.0–10.5)
nRBC: 0.5 % — ABNORMAL HIGH (ref 0.0–0.2)
nRBC: 1 % — ABNORMAL HIGH (ref 0.0–0.2)
nRBC: 2.6 % — ABNORMAL HIGH (ref 0.0–0.2)
nRBC: 3.7 % — ABNORMAL HIGH (ref 0.0–0.2)

## 2019-11-21 LAB — PREPARE FRESH FROZEN PLASMA
Unit division: 0
Unit division: 0

## 2019-11-21 LAB — PREPARE PLATELET PHERESIS: Unit division: 0

## 2019-11-21 LAB — BPAM PLATELET PHERESIS
Blood Product Expiration Date: 202104182359
ISSUE DATE / TIME: 202104162346
Unit Type and Rh: 5100

## 2019-11-21 LAB — MAGNESIUM
Magnesium: 2.9 mg/dL — ABNORMAL HIGH (ref 1.7–2.4)
Magnesium: 2.7 mg/dL — ABNORMAL HIGH (ref 1.7–2.4)

## 2019-11-21 LAB — BPAM FFP
Blood Product Expiration Date: 202104212359
Blood Product Expiration Date: 202104212359
ISSUE DATE / TIME: 202104162147
ISSUE DATE / TIME: 202104162147
Unit Type and Rh: 7300
Unit Type and Rh: 7300

## 2019-11-21 MED ORDER — ASPIRIN EC 81 MG PO TBEC
81.0000 mg | DELAYED_RELEASE_TABLET | Freq: Every day | ORAL | Status: DC
  Administered 2019-11-23 – 2019-12-06 (×14): 81 mg via ORAL
  Filled 2019-11-21 (×14): qty 1

## 2019-11-21 MED ORDER — METOCLOPRAMIDE HCL 5 MG/ML IJ SOLN
10.0000 mg | Freq: Four times a day (QID) | INTRAMUSCULAR | Status: DC
  Administered 2019-11-21 – 2019-11-24 (×10): 10 mg via INTRAVENOUS
  Filled 2019-11-21 (×9): qty 2

## 2019-11-21 MED ORDER — SODIUM CHLORIDE 0.9% IV SOLUTION: Freq: Once | INTRAVENOUS | Status: AC

## 2019-11-21 MED ORDER — SODIUM BICARBONATE 8.4 % IV SOLN
50.0000 meq | Freq: Once | INTRAVENOUS | Status: AC
  Administered 2019-11-21: 50 meq via INTRAVENOUS

## 2019-11-21 MED ORDER — MILRINONE LACTATE IN DEXTROSE 20-5 MG/100ML-% IV SOLN
0.5000 ug/kg/min | INTRAVENOUS | Status: DC
  Administered 2019-11-21 – 2019-11-23 (×5): 0.5 ug/kg/min via INTRAVENOUS
  Filled 2019-11-21 (×7): qty 100

## 2019-11-21 MED ORDER — POTASSIUM CHLORIDE 10 MEQ/50ML IV SOLN
10.0000 meq | INTRAVENOUS | Status: AC
  Administered 2019-11-21 (×3): 10 meq via INTRAVENOUS
  Filled 2019-11-21 (×2): qty 50

## 2019-11-21 MED ORDER — FUROSEMIDE 10 MG/ML IJ SOLN
6.0000 mg/h | INTRAVENOUS | Status: DC
  Administered 2019-11-21: 8 mg/h via INTRAVENOUS
  Administered 2019-11-22: 6 mg/h via INTRAVENOUS
  Filled 2019-11-21 (×2): qty 25

## 2019-11-21 NOTE — Progress Notes (Addendum)
CT surgery p.m. Rounds  Patient stable on ventilator-we will plan wean in a.m. after nitric weaned off Nitric oxide slowly being weaned, PA systolic pressure 60-70 on milrinone 0.5 Lasix drip started for diuresis Cardiac index 2.0 on low-dose epinephrine, milrinone P.m. hemoglobin 9.5 Maintaining sinus rhythm

## 2019-11-21 NOTE — Progress Notes (Signed)
1 Day Post-Op Procedure(s) (LRB): CORONARY ARTERY BYPASS GRAFTING (CABG), ON PUMP, TIMES THREE, USING LEFT INTERNAL MAMMARY ARTERY AND ENDOSCOPICALLY HARVESTED RIGHT GREATER SAPHENOUS VEIN (N/A) TRANSESOPHAGEAL ECHOCARDIOGRAM (TEE) (N/A) Subjective: Intubated/sedated  Objective: Vital signs in last 24 hours: Temp:  [95.9 F (35.5 C)-99.1 F (37.3 C)] 97.9 F (36.6 C) (04/17 0715) Pulse Rate:  [75-121] 85 (04/17 0715) Cardiac Rhythm: Normal sinus rhythm (04/17 0700) Resp:  [0-29] 15 (04/17 0715) BP: (81-123)/(50-99) 105/61 (04/17 0700) SpO2:  [94 %-100 %] 100 % (04/17 0715) Arterial Line BP: (72-140)/(47-80) 95/59 (04/17 0715) FiO2 (%):  [50 %] 50 % (04/17 0500) Weight:  [70.8 kg] 70.8 kg (04/16 1234)  Hemodynamic parameters for last 24 hours: PAP: (31-59)/(19-29) 59/28 CO:  [3.4 L/min-5.4 L/min] 4.1 L/min CI:  [1.8 L/min/m2-3 L/min/m2] 2.3 L/min/m2  Intake/Output from previous day: 04/16 0701 - 04/17 0700 In: 8422 [I.V.:3007.2; Blood:3171.3; NG/GT:90; IV Piggyback:2153.5] Out: 5543 [Urine:1960; Emesis/NG output:200; Blood:2073; Chest Tube:1310] Intake/Output this shift: No intake/output data recorded.  General appearance: sedated Neurologic: unable to assess Heart: regular rate and rhythm, S1, S2 normal, no murmur, click, rub or gallop Lungs: clear to auscultation bilaterally Abdomen: soft, non-tender; bowel sounds normal; no masses,  no organomegaly Extremities: extremities normal, atraumatic, no cyanosis or edema Wound: dressed, dry  Lab Results: Recent Labs    11/20/19 2303 11/20/19 2304 11/21/19 0456 11/21/19 0502  WBC 13.4*  --  10.3  --   HGB 9.7*   < > 10.6* 10.5*  HCT 29.1*   < > 31.9* 31.0*  PLT 135*  --  210  --    < > = values in this interval not displayed.   BMET:  Recent Labs    11/20/19 0606 11/20/19 1515 11/20/19 2002 11/20/19 2007 11/21/19 0456 11/21/19 0502  NA 133*   < > 136   < > 143 141  K 4.1   < > 4.1   < > 3.7 3.7  CL 101   < >  107  --  109  --   CO2 22  --   --   --  23  --   GLUCOSE 100*   < > 161*  --  213*  --   BUN 14   < > 12  --  13  --   CREATININE 0.98   < > 0.70  --  1.25*  --   CALCIUM 8.3*  --   --   --  7.8*  --    < > = values in this interval not displayed.    PT/INR:  Recent Labs    11/20/19 2303  LABPROT 19.4*  INR 1.6*   ABG    Component Value Date/Time   PHART 7.378 11/21/2019 0502   HCO3 24.5 11/21/2019 0502   TCO2 26 11/21/2019 0502   ACIDBASEDEF 1.0 11/21/2019 0502   O2SAT 100.0 11/21/2019 0502   CBG (last 3)  Recent Labs    11/21/19 0238 11/21/19 0517 11/21/19 0606  GLUCAP 185* 207* 193*    Assessment/Plan: S/P Procedure(s) (LRB): CORONARY ARTERY BYPASS GRAFTING (CABG), ON PUMP, TIMES THREE, USING LEFT INTERNAL MAMMARY ARTERY AND ENDOSCOPICALLY HARVESTED RIGHT GREATER SAPHENOUS VEIN (N/A) TRANSESOPHAGEAL ECHOCARDIOGRAM (TEE) (N/A) iNO wean as tolerated   LOS: 4 days     [name] 11/21/2019

## 2019-11-22 ENCOUNTER — Inpatient Hospital Stay: Payer: Self-pay

## 2019-11-22 ENCOUNTER — Inpatient Hospital Stay (HOSPITAL_COMMUNITY): Payer: Medicare PPO

## 2019-11-22 LAB — GLUCOSE, CAPILLARY
Glucose-Capillary: 109 mg/dL — ABNORMAL HIGH (ref 70–99)
Glucose-Capillary: 154 mg/dL — ABNORMAL HIGH (ref 70–99)
Glucose-Capillary: 121 mg/dL — ABNORMAL HIGH (ref 70–99)
Glucose-Capillary: 141 mg/dL — ABNORMAL HIGH (ref 70–99)
Glucose-Capillary: 114 mg/dL — ABNORMAL HIGH (ref 70–99)
Glucose-Capillary: 121 mg/dL — ABNORMAL HIGH (ref 70–99)
Glucose-Capillary: 118 mg/dL — ABNORMAL HIGH (ref 70–99)
Glucose-Capillary: 94 mg/dL (ref 70–99)
Glucose-Capillary: 121 mg/dL — ABNORMAL HIGH (ref 70–99)
Glucose-Capillary: 118 mg/dL — ABNORMAL HIGH (ref 70–99)
Glucose-Capillary: 141 mg/dL — ABNORMAL HIGH (ref 70–99)

## 2019-11-22 LAB — RENAL FUNCTION PANEL
Albumin: 3.1 g/dL — ABNORMAL LOW (ref 3.5–5.0)
Anion gap: 9 (ref 5–15)
BUN: 11 mg/dL (ref 8–23)
CO2: 28 mmol/L (ref 22–32)
Calcium: 8.6 mg/dL — ABNORMAL LOW (ref 8.9–10.3)
Chloride: 103 mmol/L (ref 98–111)
Creatinine, Ser: 1.01 mg/dL (ref 0.61–1.24)
GFR calc Af Amer: >60 mL/min (ref >60)
GFR calc non Af Amer: >60 mL/min (ref >60)
Glucose, Bld: 108 mg/dL — ABNORMAL HIGH (ref 70–99)
Phosphorus: 3.1 mg/dL (ref 2.5–4.6)
Potassium: 3.8 mmol/L (ref 3.5–5.1)
Sodium: 140 mmol/L (ref 135–145)

## 2019-11-22 LAB — POCT I-STAT 7, (LYTES, BLD GAS, ICA,H+H)
Acid-Base Excess: 3 mmol/L — ABNORMAL HIGH (ref 0.0–2.0)
Acid-Base Excess: 8 mmol/L — ABNORMAL HIGH (ref 0.0–2.0)
Bicarbonate: 29.1 mmol/L — ABNORMAL HIGH (ref 20.0–28.0)
Bicarbonate: 32.3 mmol/L — ABNORMAL HIGH (ref 20.0–28.0)
Calcium, Ion: 1.15 mmol/L (ref 1.15–1.40)
Calcium, Ion: 1.19 mmol/L (ref 1.15–1.40)
HCT: 39 % (ref 39.0–52.0)
HCT: 37 % — ABNORMAL LOW (ref 39.0–52.0)
Hemoglobin: 13.3 g/dL (ref 13.0–17.0)
Hemoglobin: 12.6 g/dL — ABNORMAL LOW (ref 13.0–17.0)
O2 Saturation: 100 %
O2 Saturation: 100 %
Patient temperature: 35.6
Patient temperature: 36.1
Potassium: 3.6 mmol/L (ref 3.5–5.1)
Potassium: 3.9 mmol/L (ref 3.5–5.1)
Sodium: 142 mmol/L (ref 135–145)
Sodium: 139 mmol/L (ref 135–145)
TCO2: 31 mmol/L (ref 22–32)
TCO2: 34 mmol/L — ABNORMAL HIGH (ref 22–32)
pCO2 arterial: 44.9 mmHg (ref 32.0–48.0)
pCO2 arterial: 42.5 mmHg (ref 32.0–48.0)
pH, Arterial: 7.414 (ref 7.350–7.450)
pH, Arterial: 7.486 — ABNORMAL HIGH (ref 7.350–7.450)
pO2, Arterial: 182 mmHg — ABNORMAL HIGH (ref 83.0–108.0)
pO2, Arterial: 162 mmHg — ABNORMAL HIGH (ref 83.0–108.0)

## 2019-11-22 LAB — PREPARE PLATELET PHERESIS: Unit division: 0

## 2019-11-22 LAB — BPAM FFP
Blood Product Expiration Date: 202104222359
Blood Product Expiration Date: 202104222359
ISSUE DATE / TIME: 202104170430
ISSUE DATE / TIME: 202104170430
Unit Type and Rh: 1700
Unit Type and Rh: 7300

## 2019-11-22 LAB — COOXEMETRY PANEL
Carboxyhemoglobin: 0.9 % (ref 0.5–1.5)
Methemoglobin: 0.7 % (ref 0.0–1.5)
O2 Saturation: 80.8 %
Total hemoglobin: 12.1 g/dL (ref 12.0–16.0)

## 2019-11-22 LAB — PREPARE FRESH FROZEN PLASMA
Unit division: 0
Unit division: 0

## 2019-11-22 LAB — BASIC METABOLIC PANEL
Anion gap: 9 (ref 5–15)
BUN: 12 mg/dL (ref 8–23)
CO2: 27 mmol/L (ref 22–32)
Calcium: 7.9 mg/dL — ABNORMAL LOW (ref 8.9–10.3)
Chloride: 105 mmol/L (ref 98–111)
Creatinine, Ser: 1.08 mg/dL (ref 0.61–1.24)
GFR calc Af Amer: >60 mL/min (ref >60)
GFR calc non Af Amer: >60 mL/min (ref >60)
Glucose, Bld: 128 mg/dL — ABNORMAL HIGH (ref 70–99)
Potassium: 3.7 mmol/L (ref 3.5–5.1)
Sodium: 141 mmol/L (ref 135–145)

## 2019-11-22 LAB — CBC
HCT: 36.7 % — ABNORMAL LOW (ref 39.0–52.0)
Hemoglobin: 12.4 g/dL — ABNORMAL LOW (ref 13.0–17.0)
MCH: 30.6 pg (ref 26.0–34.0)
MCHC: 33.8 g/dL (ref 30.0–36.0)
MCV: 90.6 fL (ref 80.0–100.0)
Platelets: 159 10*3/uL (ref 150–400)
RBC: 4.05 MIL/uL — ABNORMAL LOW (ref 4.22–5.81)
RDW: 18.1 % — ABNORMAL HIGH (ref 11.5–15.5)
WBC: 13.6 10*3/uL — ABNORMAL HIGH (ref 4.0–10.5)
nRBC: 3.3 % — ABNORMAL HIGH (ref 0.0–0.2)

## 2019-11-22 LAB — BPAM PLATELET PHERESIS
Blood Product Expiration Date: 202104172359
ISSUE DATE / TIME: 202104170027
Unit Type and Rh: 6200

## 2019-11-22 LAB — CORTISOL-AM, BLOOD: Cortisol - AM: 14 ug/dL (ref 6.7–22.6)

## 2019-11-22 MED ORDER — ALBUMIN HUMAN 5 % IV SOLN
INTRAVENOUS | Status: AC
  Filled 2019-11-22: qty 250

## 2019-11-22 MED ORDER — SODIUM CHLORIDE 0.9% FLUSH
10.0000 mL | Freq: Two times a day (BID) | INTRAVENOUS | Status: DC
  Administered 2019-11-22 – 2019-11-28 (×14): 10 mL
  Administered 2019-11-29 (×2): 20 mL
  Administered 2019-11-30 – 2019-12-05 (×6): 10 mL

## 2019-11-22 MED ORDER — SODIUM CHLORIDE 0.9 % IV SOLN
1.0000 g | INTRAVENOUS | Status: DC
  Administered 2019-11-22 – 2019-11-23 (×2): 1 g via INTRAVENOUS
  Filled 2019-11-22: qty 10
  Filled 2019-11-22 (×2): qty 1

## 2019-11-22 MED ORDER — ALBUMIN HUMAN 5 % IV SOLN
12.5000 g | Freq: Once | INTRAVENOUS | Status: AC
  Administered 2019-11-22: 12.5 g via INTRAVENOUS

## 2019-11-22 MED ORDER — ALBUMIN HUMAN 25 % IV SOLN
12.5000 g | Freq: Once | INTRAVENOUS | Status: DC
  Filled 2019-11-22: qty 50

## 2019-11-22 MED ORDER — CALCIUM CHLORIDE 10 % IV SOLN
1.0000 g | Freq: Once | INTRAVENOUS | Status: AC
  Administered 2019-11-22: 1 g via INTRAVENOUS

## 2019-11-22 MED ORDER — ORAL CARE MOUTH RINSE
15.0000 mL | OROMUCOSAL | Status: DC
  Administered 2019-11-22 – 2019-11-23 (×11): 15 mL via OROMUCOSAL

## 2019-11-22 MED ORDER — CHLORHEXIDINE GLUCONATE 0.12% ORAL RINSE (MEDLINE KIT)
15.0000 mL | Freq: Two times a day (BID) | OROMUCOSAL | Status: DC
  Administered 2019-11-22 – 2019-11-23 (×3): 15 mL via OROMUCOSAL

## 2019-11-22 MED ORDER — POTASSIUM CHLORIDE 10 MEQ/50ML IV SOLN
10.0000 meq | INTRAVENOUS | Status: AC
  Administered 2019-11-22 (×3): 10 meq via INTRAVENOUS
  Filled 2019-11-22 (×3): qty 50

## 2019-11-22 MED ORDER — SODIUM CHLORIDE 0.9% FLUSH: 10.0000 mL | INTRAVENOUS | Status: DC | PRN

## 2019-11-22 MED ORDER — INSULIN ASPART 100 UNIT/ML ~~LOC~~ SOLN: 0.0000 [IU] | SUBCUTANEOUS | Status: DC

## 2019-11-22 MED ORDER — INSULIN ASPART 100 UNIT/ML ~~LOC~~ SOLN
0.0000 [IU] | SUBCUTANEOUS | Status: DC
  Administered 2019-11-22 – 2019-11-23 (×4): 1 [IU] via SUBCUTANEOUS

## 2019-11-22 NOTE — Progress Notes (Signed)
RT note-Patient placed on full support per Dr. Donata Clay, need RR to be above 12 consistent before weaning to extubate.

## 2019-11-22 NOTE — Progress Notes (Signed)
Peripherally Inserted Central Catheter Placement  The IV Nurse has discussed with the patient and/or persons authorized to consent for the patient, the purpose of this procedure and the potential benefits and risks involved with this procedure.  The benefits include less needle sticks, lab draws from the catheter, and the patient may be discharged home with the catheter. Risks include, but not limited to, infection, bleeding, blood clot (thrombus formation), and puncture of an artery; nerve damage and irregular heartbeat and possibility to perform a PICC exchange if needed/ordered by physician.  Alternatives to this procedure were also discussed.  Bard Power PICC patient education guide, fact sheet on infection prevention and patient information card has been provided to patient /or left at bedside.  Consent obtained at bedside from niece due to pt sedated.  PICC Placement Documentation  PICC Double Lumen 11/22/19 PICC Right Brachial 37 cm 0 cm (Active)  Indication for Insertion or Continuance of Line Vasoactive infusions 11/22/19 1500  Exposed Catheter (cm) 0 cm 11/22/19 1500  Site Assessment Clean;Dry;Intact 11/22/19 1500  Lumen #1 Status Flushed;Saline locked;Blood return noted 11/22/19 1500  Lumen #2 Status Flushed;Saline locked;Blood return noted 11/22/19 1500  Dressing Type Transparent 11/22/19 1500  Dressing Status Clean;Dry;Intact;Antimicrobial disc in place 11/22/19 1500  Line Care Connections checked and tightened 11/22/19 1500  Line Adjustment (NICU/IV Team Only) No 11/22/19 1500  Dressing Intervention New dressing 11/22/19 1500  Dressing Change Due 11/29/19 11/22/19 1500       Elliot Dally 11/22/2019, 3:16 PM

## 2019-11-22 NOTE — Progress Notes (Signed)
CT surgery p.m. Rounds  Patient had good day with vent wean down to CPAP pressure support Patient spontaneous ventilations only 8/min however so we will hold on extubation until he is less sedated Hemodynamics improve and pressors have been partially weaned Adequate urine output P.m. labs satisfactory

## 2019-11-22 NOTE — Discharge Summary (Signed)
hysician Discharge Summary  Patient ID: [name] MRN: [id number] DOB/AGE: 10-22-1935 84 y.o.  Admit date: 11/17/2019 Discharge date: 11/30/2019  Admission Diagnoses:  Patient Active Problem List   Diagnosis Date Noted  . Pressure injury of skin 11/18/2019  . NSTEMI (non-ST elevated myocardial infarction) (HCC) 11/17/2019  . Elevated troponin 11/03/2019  . PVD (peripheral vascular disease) (HCC) 11/03/2019  . History of CVA with residual deficit 11/03/2019  . Elevated CK 11/03/2019  . Fall at home, initial encounter 11/03/2019  . Prediabetes 11/03/2019  . Essential hypertension 11/03/2019  . PAD (peripheral artery disease) (HCC) 05/11/2019   Discharge Diagnoses:   Patient Active Problem List   Diagnosis Date Noted  . S/P CABG x 3 11/20/2019  . Pressure injury of skin 11/18/2019  . NSTEMI (non-ST elevated myocardial infarction) (HCC) 11/17/2019  . Elevated troponin 11/03/2019  . PVD (peripheral vascular disease) (HCC) 11/03/2019  . History of CVA with residual deficit 11/03/2019  . Elevated CK 11/03/2019  . Fall at home, initial encounter 11/03/2019  . Prediabetes 11/03/2019  . Essential hypertension 11/03/2019  . PAD (peripheral artery disease) (HCC) 05/11/2019   Discharged Condition: good  History of Present Illness:  [name] is an 84 yo AA male with known history of PAD, PVD, HTN, and H/O stroke.  The patient suffered a fall approximately 2 weeks ago.  He was evaluated and discharged home with H/H.  However a few days later he presented with complaints of substernal chest pain.  He thought this might be related due to chronic acid reflux.  Workup in the ED showed a reduced EF which was new from previous evaluation and his Troponin levels were elevated.  He was ruled in for NSTEMI and admitted for further care.      Hospital Course:   He underwent cardiac catheterization which showed multivessel CAD with LM involvement.  It was felt coronary bypass grafting would  be indicated and TCTS consult was requested.  He was evaluated by Dr. Vickey Sages who was in agreement the patient's coronary disease would best be treated with coronary bypass procedure.  The risks and benefits of the procedure were explained to the patient and he was agreeable to proceed.   He was taken to the operating room on 11/20/2019.  He underwent CABG x 3 utilizing LIMA to LAD, SVG to RCA and SVG to Diagonal.  He also underwent removal of a Left Atrial Appendage thrombus with subsequent ligation oversew and application of clip.  He also underwent endoscopic harvest of greater saphenous vein from his right thigh.  He tolerated the procedure without difficulty and was taken to the SICU in stable condition.  During his stay in the SICU the patient was weaned off Nitric Oxide as tolerated.  He was extubated on 11/23/2019.  He was weaned off Milrinone and Epinephrine drips as hemodynamics allowed.  He was started on Lasix drip for volume overload status.  The patient underwent SLP evaluation and was felt to be at risk of aspiration.  He underwent MBS and he was started on a Dysphagia 2 diet.  He was evaluated by PT/OT whom recommended CIR placement.  The patient was maintaining NSR and felt stable for transfer to the progressive care unit on 11/26/2019.  His pacing wires were removed without difficulty.  He is deconditioned, but is participating with PT/OT.  They have recommended CIR placement.  The patient remains in NSR.  His incisions are healing without evidence of infection.  He is medically stable for  discharge to CIR today.           Consults: SLP  Significant Diagnostic Studies: angiography:    Ost RCA to Prox RCA lesion is 70% stenosed.  Mid RCA lesion is 75% stenosed.  Ost Cx to Prox Cx lesion is 100% stenosed.  Ost LM to Mid LM lesion is 75% stenosed.  Dist LM to Prox LAD lesion is 90% stenosed.  Mid LAD lesion is 25% stenosed.  There is moderate left ventricular systolic  dysfunction.  LV end diastolic pressure is moderately elevated.  The left ventricular ejection fraction is 35-45% by visual estimate.  There is no aortic valve stenosis.   Severe three vessel disease including heavily calcified left main and proximal LAD.    Treatments: surgery:    Coronary Artery Bypass Grafting x 3             Left Internal Mammary Artery to Distal Left Anterior Descending Coronary Artery; Saphenous Vein Graft to distal right coronary  Artery; Saphenous Vein Graft to  Diagonal Branch Coronary Artery; Endoscopic Vein Harvest from right thigh and Lower Leg  Discharge Exam: Blood pressure (!) 101/59, pulse 66, temperature 97.8 F (36.6 C), temperature source Oral, resp. rate 18, height 5' 5.98" (1.676 m), weight 70.1 kg, SpO2 96 %.  General appearance: alert, cooperative and no distress Heart: regular rate and rhythm Lungs: clear to auscultation bilaterally Abdomen: soft, non-tender; bowel sounds normal; no masses,  no organomegaly Extremities: edema trace Wound: clean and dry  The patient has been discharged on:   1.Beta Blocker:  Yes [   ]                              No   [ x  ]                              If No, reason:labile BP, 2.Ace Inhibitor/ARB: Yes [   ]                                     No  [  x  ]                                     If No, reason: labile  BP  3.Statin:   Yes [ X  ]                  No  [   ]                  If No, reason:  4.Ecasa:  Yes  Valu.Nieves   ]                  No   [   ]                  If No, reason:   Allergies as of 11/30/2019   No Known Allergies     Medication List    STOP taking these medications   diltiazem 180 MG 24 hr capsule Commonly known as: CARDIZEM CD   hydrochlorothiazide 12.5 MG tablet Commonly known as: HYDRODIURIL   lisinopril 10 MG tablet Commonly known as: ZESTRIL   lisinopril-hydrochlorothiazide  10-12.5 MG tablet Commonly known as: ZESTORETIC     TAKE these medications    acetaminophen 325 MG tablet Commonly known as: TYLENOL Take 2 tablets (650 mg total) by mouth every 4 (four) hours as needed for headache or mild pain.   amiodarone 200 MG tablet Commonly known as: PACERONE Take 1 tablet (200 mg total) by mouth 2 (two) times daily. For 7 days, then decrease to 200 mg daily   aspirin EC 81 MG tablet Take 81 mg by mouth daily.   cetirizine 10 MG tablet Commonly known as: ZYRTEC Take 10 mg by mouth as needed for allergies.   clopidogrel 75 MG tablet Commonly known as: PLAVIX TAKE 1 TABLET BY MOUTH DAILY   diclofenac sodium 1 % Gel Commonly known as: VOLTAREN Apply 2 g topically 4 (four) times daily. Rub into affected area of foot 2 to 4 times daily   food thickener Powd Commonly known as: THICK IT For nectar thick consistancy   furosemide 40 MG tablet Commonly known as: LASIX Take 1 tablet (40 mg total) by mouth daily. For 7 days   polyethylene glycol 17 g packet Commonly known as: MIRALAX / GLYCOLAX Take 17 g by mouth daily. What changed:   when to take this  reasons to take this   rosuvastatin 10 MG tablet Commonly known as: CRESTOR TAKE 1 TABLET BY MOUTH EVERY DAY   traMADol 50 MG tablet Commonly known as: ULTRAM Take 1-2 tablets (50-100 mg total) by mouth every 4 (four) hours as needed for moderate pain.   Vitamin D 50 MCG (2000 UT) tablet Take 2,000 Units by mouth daily.   VITAMIN D PO Take 1 tablet by mouth daily.   zinc gluconate 50 MG tablet Take 50 mg by mouth daily.      Follow-up Information    Linden Dolin, MD Follow up on 12/07/2019.   Specialty: Cardiothoracic Surgery Why: Appointment is at 1:30 Contact information: [address] [place] [address] [place] [address] [phone]           Signed:  Lowella Dandy PA-C 11/30/2019, 8:45 AM

## 2019-11-22 NOTE — Progress Notes (Signed)
Removed femoral arterial sheath at 1245. At level 0. No issues noted. Will continue to monitor.  Joycie Peek, RN 11/22/2019

## 2019-11-22 NOTE — Progress Notes (Signed)
Spoke with Gabriel Rung, RN concerning PICC order. DL ordered. Patient not able to sign, but niece to sign.

## 2019-11-22 NOTE — Anesthesia Postprocedure Evaluation (Signed)
Anesthesia Post Note  Patient: [name]  Procedure(s) Performed: CORONARY ARTERY BYPASS GRAFTING (CABG), ON PUMP, TIMES THREE, USING LEFT INTERNAL MAMMARY ARTERY AND ENDOSCOPICALLY HARVESTED RIGHT GREATER SAPHENOUS VEIN (N/A Chest) TRANSESOPHAGEAL ECHOCARDIOGRAM (TEE) (N/A )     Patient location during evaluation: SICU Anesthesia Type: General Level of consciousness: sedated Pain management: pain level controlled Vital Signs Assessment: post-procedure vital signs reviewed and stable Respiratory status: patient remains intubated per anesthesia plan Cardiovascular status: stable Postop Assessment: no apparent nausea or vomiting Anesthetic complications: no    Last Vitals:  Vitals:   11/22/19 1100 11/22/19 1141  BP: 100/70   Pulse: 88   Resp: 12   Temp: (!) 35.9 C   SpO2: 100% 100%    Last Pain:  Vitals:   11/22/19 0800  TempSrc: Core  PainSc:                  Belvia Gotschall

## 2019-11-22 NOTE — Procedures (Signed)
Arterial Catheter Insertion Procedure Note [name] [id number] 10/22/1935  Procedure: Insertion of Arterial Catheter  Indications: Blood pressure monitoring   Procedure Details Consent: Unable to obtain consent because of altered level of consciousness. Time Out: Verified patient identification, verified procedure, site/side was marked, verified correct patient position, special equipment/implants available, medications/allergies/relevent history reviewed, required imaging and test results available.  Performed  Maximum sterile technique was used including antiseptics, cap, gloves, gown, hand hygiene, mask and sheet. Skin prep: Chlorhexidine; local anesthetic administered 20 gauge catheter was inserted into left radial artery using the Seldinger technique. ULTRASOUND GUIDANCE USED: NO Evaluation Blood flow good; BP tracing good. Complications: No apparent complications.   [name] [facility] 11/22/2019

## 2019-11-22 NOTE — Progress Notes (Signed)
2 Days Post-Op Procedure(s) (LRB): CORONARY ARTERY BYPASS GRAFTING (CABG), ON PUMP, TIMES THREE, USING LEFT INTERNAL MAMMARY ARTERY AND ENDOSCOPICALLY HARVESTED RIGHT GREATER SAPHENOUS VEIN (N/A) TRANSESOPHAGEAL ECHOCARDIOGRAM (TEE) (N/A) Subjective: Patient sedated on ventilator Nitric oxide has been weaned off with PA pressures now 45/25 and adequate cardiac output. Sedation will be stopped for pressure support ventilator weaning.  Chest x-ray is improved with improved diuresis from Lasix drip Patient condition discussed with family member at the bedside and plan of care for ventilator wean and possible extubation in the next 24 hours.  Objective: Vital signs in last 24 hours: Temp:  [96.1 F (35.6 C)-98.6 F (37 C)] 96.6 F (35.9 C) (04/18 1100) Pulse Rate:  [80-93] 88 (04/18 1100) Cardiac Rhythm: Atrial paced (04/18 0800) Resp:  [0-36] 12 (04/18 1100) BP: (100-133)/(70-80) 100/70 (04/18 1100) SpO2:  [95 %-100 %] 100 % (04/18 1141) Arterial Line BP: (101-162)/(47-93) 113/47 (04/18 1100) FiO2 (%):  [40 %] 40 % (04/18 1141) Weight:  [79.6 kg] 79.6 kg (04/17 2300)  Hemodynamic parameters for last 24 hours: PAP: (41-70)/(17-33) 46/22 CO:  [3.1 L/min-3.9 L/min] 3.1 L/min CI:  [1.7 L/min/m2-2.2 L/min/m2] 1.7 L/min/m2  Intake/Output from previous day: 04/17 0701 - 04/18 0700 In: 4068 [I.V.:3049.9; Blood:605; NG/GT:60; IV Piggyback:323] Out: 4570 [Urine:3700; Chest Tube:870] Intake/Output this shift: Total I/O In: 550.6 [I.V.:342.5; IV Piggyback:208.1] Out: 845 [Urine:825; Chest Tube:20]  Exam Sedated on vent Breath sounds clear Pupils equal Extremities warm with minimal edema Sinus rhythm without murmur Lab Results: Recent Labs    11/21/19 2041 11/21/19 2041 11/22/19 0409 11/22/19 0410  WBC 12.9*  --   --  13.6*  HGB 12.3*   < > 13.3 12.4*  HCT 35.8*   < > 39.0 36.7*  PLT 160  --   --  159   < > = values in this interval not displayed.   BMET:  Recent Labs   11/21/19 2041 11/21/19 2041 11/22/19 0409 11/22/19 0410  NA 140   < > 142 141  K 3.9   < > 3.6 3.7  CL 110  --   --  105  CO2 23  --   --  27  GLUCOSE 121*  --   --  128*  BUN 14  --   --  12  CREATININE 1.11  --   --  1.08  CALCIUM 7.6*  --   --  7.9*   < > = values in this interval not displayed.    PT/INR:  Recent Labs    11/20/19 2303  LABPROT 19.4*  INR 1.6*   ABG    Component Value Date/Time   PHART 7.414 11/22/2019 0409   HCO3 29.1 (H) 11/22/2019 0409   TCO2 31 11/22/2019 0409   ACIDBASEDEF 1.0 11/21/2019 0502   O2SAT 80.8 11/22/2019 0410   CBG (last 3)  Recent Labs    11/22/19 0816 11/22/19 0952 11/22/19 1221  GLUCAP 114* 121* 118*    Assessment/Plan: S/P Procedure(s) (LRB): CORONARY ARTERY BYPASS GRAFTING (CABG), ON PUMP, TIMES THREE, USING LEFT INTERNAL MAMMARY ARTERY AND ENDOSCOPICALLY HARVESTED RIGHT GREATER SAPHENOUS VEIN (N/A) TRANSESOPHAGEAL ECHOCARDIOGRAM (TEE) (N/A) Diuresis Ventilator wean    LOS: 5 days    [name] 11/22/2019

## 2019-11-22 NOTE — Progress Notes (Signed)
Pt returned to full support due to apnea down to 7. Will monitor

## 2019-11-23 ENCOUNTER — Inpatient Hospital Stay (HOSPITAL_COMMUNITY): Payer: Medicare PPO

## 2019-11-23 ENCOUNTER — Encounter: Payer: Self-pay | Admitting: *Deleted

## 2019-11-23 DIAGNOSIS — Z951 Presence of aortocoronary bypass graft: Secondary | ICD-10-CM

## 2019-11-23 LAB — BPAM RBC
Blood Product Expiration Date: 202104212359
Blood Product Expiration Date: 202104272359
Blood Product Expiration Date: 202104282359
Blood Product Expiration Date: 202105022359
Blood Product Expiration Date: 202105212359
Blood Product Expiration Date: 202105222359
Blood Product Expiration Date: 202105232359
Blood Product Expiration Date: 202105242359
Blood Product Expiration Date: 202105252359
Blood Product Expiration Date: 202105252359
ISSUE DATE / TIME: 202104161430
ISSUE DATE / TIME: 202104161430
ISSUE DATE / TIME: 202104161606
ISSUE DATE / TIME: 202104161606
ISSUE DATE / TIME: 202104161745
ISSUE DATE / TIME: 202104162346
ISSUE DATE / TIME: 202104162346
ISSUE DATE / TIME: 202104171611
Unit Type and Rh: 1700
Unit Type and Rh: 1700
Unit Type and Rh: 1700
Unit Type and Rh: 1700
Unit Type and Rh: 1700
Unit Type and Rh: 1700
Unit Type and Rh: 1700
Unit Type and Rh: 1700
Unit Type and Rh: 1700
Unit Type and Rh: 9500

## 2019-11-23 LAB — POCT I-STAT 7, (LYTES, BLD GAS, ICA,H+H)
Acid-Base Excess: 7 mmol/L — ABNORMAL HIGH (ref 0.0–2.0)
Acid-Base Excess: 8 mmol/L — ABNORMAL HIGH (ref 0.0–2.0)
Acid-Base Excess: 5 mmol/L — ABNORMAL HIGH (ref 0.0–2.0)
Acid-Base Excess: 9 mmol/L — ABNORMAL HIGH (ref 0.0–2.0)
Bicarbonate: 32.6 mmol/L — ABNORMAL HIGH (ref 20.0–28.0)
Bicarbonate: 32.4 mmol/L — ABNORMAL HIGH (ref 20.0–28.0)
Bicarbonate: 29.5 mmol/L — ABNORMAL HIGH (ref 20.0–28.0)
Bicarbonate: 33.6 mmol/L — ABNORMAL HIGH (ref 20.0–28.0)
Calcium, Ion: 1.19 mmol/L (ref 1.15–1.40)
Calcium, Ion: 1.19 mmol/L (ref 1.15–1.40)
Calcium, Ion: 1.13 mmol/L — ABNORMAL LOW (ref 1.15–1.40)
Calcium, Ion: 1.21 mmol/L (ref 1.15–1.40)
HCT: 34 % — ABNORMAL LOW (ref 39.0–52.0)
HCT: 35 % — ABNORMAL LOW (ref 39.0–52.0)
HCT: 32 % — ABNORMAL LOW (ref 39.0–52.0)
HCT: 38 % — ABNORMAL LOW (ref 39.0–52.0)
Hemoglobin: 11.6 g/dL — ABNORMAL LOW (ref 13.0–17.0)
Hemoglobin: 11.9 g/dL — ABNORMAL LOW (ref 13.0–17.0)
Hemoglobin: 10.9 g/dL — ABNORMAL LOW (ref 13.0–17.0)
Hemoglobin: 12.9 g/dL — ABNORMAL LOW (ref 13.0–17.0)
O2 Saturation: 99 %
O2 Saturation: 100 %
O2 Saturation: 100 %
O2 Saturation: 94 %
Patient temperature: 36.7
Patient temperature: 35.9
Patient temperature: 97.8
Patient temperature: 98.5
Potassium: 4 mmol/L (ref 3.5–5.1)
Potassium: 3.8 mmol/L (ref 3.5–5.1)
Potassium: 3.3 mmol/L — ABNORMAL LOW (ref 3.5–5.1)
Potassium: 3.6 mmol/L (ref 3.5–5.1)
Sodium: 137 mmol/L (ref 135–145)
Sodium: 137 mmol/L (ref 135–145)
Sodium: 139 mmol/L (ref 135–145)
Sodium: 136 mmol/L (ref 135–145)
TCO2: 34 mmol/L — ABNORMAL HIGH (ref 22–32)
TCO2: 34 mmol/L — ABNORMAL HIGH (ref 22–32)
TCO2: 31 mmol/L (ref 22–32)
TCO2: 35 mmol/L — ABNORMAL HIGH (ref 22–32)
pCO2 arterial: 46.9 mmHg (ref 32.0–48.0)
pCO2 arterial: 42 mmHg (ref 32.0–48.0)
pCO2 arterial: 41.4 mmHg (ref 32.0–48.0)
pCO2 arterial: 45 mmHg (ref 32.0–48.0)
pH, Arterial: 7.448 (ref 7.350–7.450)
pH, Arterial: 7.491 — ABNORMAL HIGH (ref 7.350–7.450)
pH, Arterial: 7.458 — ABNORMAL HIGH (ref 7.350–7.450)
pH, Arterial: 7.481 — ABNORMAL HIGH (ref 7.350–7.450)
pO2, Arterial: 159 mmHg — ABNORMAL HIGH (ref 83.0–108.0)
pO2, Arterial: 173 mmHg — ABNORMAL HIGH (ref 83.0–108.0)
pO2, Arterial: 169 mmHg — ABNORMAL HIGH (ref 83.0–108.0)
pO2, Arterial: 68 mmHg — ABNORMAL LOW (ref 83.0–108.0)

## 2019-11-23 LAB — BASIC METABOLIC PANEL
Anion gap: 10 (ref 5–15)
BUN: 12 mg/dL (ref 8–23)
CO2: 29 mmol/L (ref 22–32)
Calcium: 8.4 mg/dL — ABNORMAL LOW (ref 8.9–10.3)
Chloride: 99 mmol/L (ref 98–111)
Creatinine, Ser: 1.09 mg/dL (ref 0.61–1.24)
GFR calc Af Amer: >60 mL/min (ref >60)
GFR calc non Af Amer: >60 mL/min (ref >60)
Glucose, Bld: 149 mg/dL — ABNORMAL HIGH (ref 70–99)
Potassium: 4 mmol/L (ref 3.5–5.1)
Sodium: 138 mmol/L (ref 135–145)

## 2019-11-23 LAB — TYPE AND SCREEN
ABO/RH(D): B NEG
Antibody Screen: POSITIVE
Unit division: 0
Unit division: 0
Unit division: 0
Unit division: 0
Unit division: 0
Unit division: 0
Unit division: 0
Unit division: 0
Unit division: 0
Unit division: 0

## 2019-11-23 LAB — CBC
HCT: 34.8 % — ABNORMAL LOW (ref 39.0–52.0)
Hemoglobin: 11.6 g/dL — ABNORMAL LOW (ref 13.0–17.0)
MCH: 30.8 pg (ref 26.0–34.0)
MCHC: 33.3 g/dL (ref 30.0–36.0)
MCV: 92.3 fL (ref 80.0–100.0)
Platelets: 144 10*3/uL — ABNORMAL LOW (ref 150–400)
RBC: 3.77 MIL/uL — ABNORMAL LOW (ref 4.22–5.81)
RDW: 17.8 % — ABNORMAL HIGH (ref 11.5–15.5)
WBC: 12.3 10*3/uL — ABNORMAL HIGH (ref 4.0–10.5)
nRBC: 1 % — ABNORMAL HIGH (ref 0.0–0.2)

## 2019-11-23 LAB — COOXEMETRY PANEL
Carboxyhemoglobin: 1.2 % (ref 0.5–1.5)
Methemoglobin: 0.6 % (ref 0.0–1.5)
O2 Saturation: 81.5 %
Total hemoglobin: 11 g/dL — ABNORMAL LOW (ref 12.0–16.0)

## 2019-11-23 LAB — GLUCOSE, CAPILLARY
Glucose-Capillary: 104 mg/dL — ABNORMAL HIGH (ref 70–99)
Glucose-Capillary: 114 mg/dL — ABNORMAL HIGH (ref 70–99)
Glucose-Capillary: 132 mg/dL — ABNORMAL HIGH (ref 70–99)
Glucose-Capillary: 140 mg/dL — ABNORMAL HIGH (ref 70–99)
Glucose-Capillary: 146 mg/dL — ABNORMAL HIGH (ref 70–99)
Glucose-Capillary: 84 mg/dL (ref 70–99)

## 2019-11-23 LAB — SURGICAL PATHOLOGY

## 2019-11-23 LAB — POTASSIUM: Potassium: 3.6 mmol/L (ref 3.5–5.1)

## 2019-11-23 LAB — PATHOLOGIST SMEAR REVIEW

## 2019-11-23 MED ORDER — INSULIN ASPART 100 UNIT/ML ~~LOC~~ SOLN
0.0000 [IU] | SUBCUTANEOUS | Status: DC
  Administered 2019-11-26: 1 [IU] via SUBCUTANEOUS
  Administered 2019-11-26: 2 [IU] via SUBCUTANEOUS
  Administered 2019-11-26 – 2019-11-27 (×2): 1 [IU] via SUBCUTANEOUS

## 2019-11-23 MED ORDER — CHLORHEXIDINE GLUCONATE 0.12 % MT SOLN
15.0000 mL | Freq: Two times a day (BID) | OROMUCOSAL | Status: DC
  Administered 2019-11-24 – 2019-12-06 (×19): 15 mL via OROMUCOSAL
  Filled 2019-11-23 (×20): qty 15

## 2019-11-23 MED ORDER — POTASSIUM CHLORIDE 10 MEQ/50ML IV SOLN
10.0000 meq | INTRAVENOUS | Status: AC
  Administered 2019-11-23 (×3): 10 meq via INTRAVENOUS
  Filled 2019-11-23: qty 50

## 2019-11-23 MED ORDER — ORAL CARE MOUTH RINSE
15.0000 mL | Freq: Two times a day (BID) | OROMUCOSAL | Status: DC
  Administered 2019-11-23 – 2019-12-05 (×6): 15 mL via OROMUCOSAL

## 2019-11-23 NOTE — Progress Notes (Signed)
Spoke with Dr. Vickey Sages, he was okay with going ahead and extubating with patient Nif at -15, vital capacity at 0.5. MD advised if needed to place patient on bi-pap.  Lynita Lombard

## 2019-11-23 NOTE — Op Note (Signed)
CARDIOTHORACIC SURGERY OPERATIVE NOTE  Date of Procedure: 11/23/2019  Preoperative Diagnosis: Severe left main Coronary Artery Disease s/p NSTEM  Postoperative Diagnosis: Same, with left atrial appendage thrombus  Procedure:    Coronary Artery Bypass Grafting x 3  Left Internal Mammary Artery to Distal Left Anterior Descending Coronary Artery; Saphenous Vein Graft to distal right coronary  Artery; Saphenous Vein Graft to  Diagonal Branch Coronary Artery; Endoscopic Vein Harvest from right thigh and Lower Leg  Surgeon: B.  Lorayne Marek, MD  Assistant: Jaclyn Prime PA-C  Anesthesia: General  Operative Findings:  Reduced left ventricular systolic function  Good quality left internal mammary artery conduit  Good quality saphenous vein conduit  Good quality target vessels for grafting    BRIEF CLINICAL NOTE AND INDICATIONS FOR SURGERY  84 year old gentleman presented with NSTEMI after a fall.  His work-up was demonstrated severe left main coronary artery disease and depressed LV function.  After thorough evaluation he is taken the operating room for coronary artery bypass grafting   DETAILS OF THE OPERATIVE PROCEDURE  Preparation:  The patient is brought to the operating room on the above mentioned date and central monitoring was established by the anesthesia team including placement of Swan-Ganz catheter and radial arterial line. The patient is placed in the supine position on the operating table.  Intravenous antibiotics are administered. General endotracheal anesthesia is induced uneventfully. A Foley catheter is placed.  Baseline transesophageal echocardiogram was performed.  Findings were notable for reduced LV function and elevated PA pressures.  In addition, a large left atrial appendage thrombus is noted.  The patient's chest, abdomen, both groins, and both lower extremities are prepared and draped in a sterile manner. A time out procedure is performed.   Surgical  Approach and Conduit Harvest:  A median sternotomy incision was performed and the left internal mammary artery is dissected from the chest wall and prepared for bypass grafting. The left internal mammary artery is notably good quality conduit. Simultaneously, the greater saphenous vein is obtained from the patient's right thigh using endoscopic vein harvest technique. The saphenous vein is notably good quality conduit. After removal of the saphenous vein, the small surgical incisions in the lower extremity are closed with absorbable suture. Following systemic heparinization, the left internal mammary artery was transected distally noted to have excellent flow.   Extracorporeal Cardiopulmonary Bypass and Myocardial Protection:  The pericardium is opened. The ascending aorta is nondiseased in appearance. The ascending aorta and the right atrium are cannulated for cardiopulmonary bypass.  Adequate heparinization is verified.    Cardiopulmonary bypass was begun and the surface of the heart is inspected. Distal target vessels are selected for coronary artery bypass grafting. A cardioplegia cannula is placed in the ascending aorta.   The patient is allowed to cool passively to 34C systemic temperature.  The aortic cross clamp is applied and cold blood cardioplegia is delivered initially in an antegrade fashion through the aortic root.   Iced saline slush is applied for topical hypothermia.  The initial cardioplegic arrest is rapid with early diastolic arrest.  Repeat doses of cardioplegia are administered intermittently throughout the entire cross clamp portion of the operation through the aortic root and through subsequently placed vein grafts in order to maintain completely flat electrocardiogram.  After arrest, the left atrial appendage is opened.  The thrombus identified on TEE is manually removed.  Inspection of the left atrium is performed to exclude any other thrombotic material.  None was found.  Left  atrial  appendage clip was placed at the base of the appendage and the atriotomy is oversewn.  Coronary Artery Bypass Grafting:   The distal right coronary artery was grafted using a reversed saphenous vein graft in an end-to-side fashion.  At the site of distal anastomosis the target vessel was good quality and measured approximately 1.5 mm in diameter.  The first diagonal branch of the left anterior descending coronary artery was grafted using a reversed saphenous vein graft in an end-to-side fashion.  At the site of distal anastomosis the target vessel was good quality and measured approximately 1.5 mm in diameter.  The distal left anterior coronary artery was grafted with the left internal mammary artery in an end-to-side fashion.  At the site of distal anastomosis the target vessel was good quality and measured approximately 1.5 mm in diameter.  All proximal vein graft anastomoses were placed directly to the ascending aorta prior to removal of the aortic cross clamp.  De-airing procedures were performed and the aortic cross-clamp was removed   Procedure Completion:  All proximal and distal coronary anastomoses were inspected for hemostasis and appropriate graft orientation. Epicardial pacing wires are fixed to the right ventricular outflow tract and to the right atrial appendage. The patient is rewarmed to 37C temperature. The patient is weaned and disconnected from cardiopulmonary bypass.  The patient's rhythm at separation from bypass was sinus bradycardia.  The patient was weaned from cardiopulmonary bypass with moderate inotropic support.    The aortic and venous cannula were removed uneventfully. Protamine was administered to reverse the anticoagulation. The mediastinum and pleural space were inspected for hemostasis and irrigated with saline solution. The mediastinum and bilateral pleural spaces were drained using fluted chest tubes placed through separate stab incisions inferiorly.   The soft tissues anterior to the aorta were reapproximated loosely. The sternum is closed with double strength sternal wire. The soft tissues anterior to the sternum were closed in multiple layers and the skin is closed with a running subcuticular skin closure.    Disposition:  The patient tolerated the procedure well and is transported to the surgical intensive care in stable condition. There are no intraoperative complications. All sponge instrument and needle counts are verified correct at completion of the operation.    Jayme Cloud, MD 11/23/2019 9:21 PM

## 2019-11-23 NOTE — Procedures (Signed)
Extubation Procedure Note  Patient Details:   Name: [name] DOB: October 15, 1935 MRN: [id number]   Airway Documentation:    Vent end date: 11/23/19 Vent end time: 0945   Evaluation  O2 sats: stable throughout Complications: No apparent complications Patient did tolerate procedure well. Bilateral Breath Sounds: Clear, Diminished   Yes  Patient was extubated to 3 LPM nasal cannula with SPO2 of 98%. Patient had a positive cuff leak prior to extubation. No stridor was noted. BBS clear diminished and patient has a weak cough but was able to speak. RT will continue to monitor. RN at bedside doing IS with patient at this time.    Victory Dresden M 11/23/2019, 9:49 AM

## 2019-11-23 NOTE — Progress Notes (Signed)
NIF -15 and VC of .5 and RR of 6-8. MD Vickey Sages is okay with extubating and stated to placed patient on BIPAP if needed.

## 2019-11-23 NOTE — Evaluation (Addendum)
Clinical/Bedside Swallow Evaluation Patient Details  Name: [name] MRN: [id number] Date of Birth: 09-19-1935  Today's Date: 11/23/2019 Time: SLP Start Time (ACUTE ONLY): 1357 SLP Stop Time (ACUTE ONLY): 1417 SLP Time Calculation (min) (ACUTE ONLY): 20 min  Past Medical History:  Past Medical History:  Diagnosis Date  . Arthritis   . Bundle branch block   . Hypercholesteremia   . Hyperlipidemia 2001  . Hypertension 2001  . Hypertension   . Peripheral vascular disease (HCC)   . Stroke (HCC)   . Stroke [facility])    2001 - R side deficits   Past Surgical History:  Past Surgical History:  Procedure Laterality Date  . ABDOMINAL AORTOGRAM N/A 11/18/2019   Procedure: ABDOMINAL AORTOGRAM;  Surgeon: Corky Crafts, MD;  Location: [facility] INVASIVE CV LAB;  Service: Cardiovascular;  Laterality: N/A;  . LEFT HEART CATH AND CORONARY ANGIOGRAPHY N/A 11/18/2019   Procedure: LEFT HEART CATH AND CORONARY ANGIOGRAPHY;  Surgeon: Corky Crafts, MD;  Location: [facility] INVASIVE CV LAB;  Service: Cardiovascular;  Laterality: N/A;   HPI:  [name] is a 84 y.o. male with PMH:  HTN, HLD, CVA in 2001 in right sided weakness, prediabetes, B/L carotid artery disease,  PAD, RBBB, sinus bradycardia who arrived to ED with chest pain. Found to have NSTEMI and  intubated 4/16-4/19 for CABG on 4/16.    Assessment / Plan / Recommendation Clinical Impression  Pt extubated approximately 4.5 hours ago following a three day intubation presents at bedside with generalized weakness, deconditioning and suspect an acute reversible dysphagia. Volitional cough is weak and vocal intensity is low and slightly hoarse. Subjectively his swallow appears weak with a slower and decreased duration of laryngeal elevation. Pt cleared his throat after thin with frequent eructation. He would benefit from increased time from extubation to start meals however can have meds crushed in puree and ice chips after oral care. Prognosis  for initiating po's is good.     SLP Visit Diagnosis: Dysphagia, unspecified (R13.10)    Aspiration Risk  Mild aspiration risk    Diet Recommendation NPO except meds;Other (Comment)(ice chips after oral care)   Medication Administration: Crushed with puree Supervision: (or whole if small and cannot be crushed)    Other  Recommendations Oral Care Recommendations: Oral care QID   Follow up Recommendations Other (comment)(TBD)      Frequency and Duration min 2x/week  2 weeks       Prognosis Prognosis for Safe Diet Advancement: Good      Swallow Study   General HPI: [name] is a 84 y.o. male with PMH:  HTN, HLD, CVA in 2001 in right sided weakness, prediabetes, B/L carotid artery disease,  PAD, RBBB, sinus bradycardia who arrived to ED with chest pain. Found to have NSTEMI and  intubated 4/16-4/19 for CABG on 4/16.  Type of Study: Bedside Swallow Evaluation Previous Swallow Assessment: (none) Diet Prior to this Study: NPO Temperature Spikes Noted: No Respiratory Status: Nasal cannula History of Recent Intubation: Yes Length of Intubations (days): 3 days Date extubated: 11/23/19(9:49) Behavior/Cognition: Alert;Cooperative;Pleasant mood;Requires cueing Oral Cavity Assessment: Other (comment)(lingual candidias) Oral Care Completed by SLP: Yes Oral Cavity - Dentition: Adequate natural dentition Vision: Functional for self-feeding Self-Feeding Abilities: Needs assist Patient Positioning: Upright in bed Baseline Vocal Quality: Low vocal intensity;Hoarse Volitional Cough: Weak Volitional Swallow: Able to elicit    Oral/Motor/Sensory Function Overall Oral Motor/Sensory Function: Within functional limits   Ice Chips Ice chips: Impaired Presentation: Spoon Pharyngeal Phase  Impairments: Decreased hyoid-laryngeal movement   Thin Liquid Thin Liquid: Impaired Presentation: Cup Pharyngeal  Phase Impairments: Throat Clearing - Immediate(appeared w/ decr duration of laryngeal  elevation )    Nectar Thick Nectar Thick Liquid: Not tested   Honey Thick Honey Thick Liquid: Not tested   Puree Puree: Impaired Presentation: Spoon;Self Fed Pharyngeal Phase Impairments: (weak appearing swallow- subjectively )   Solid     Solid: Not tested      Houston Siren 11/23/2019,2:56 PM  Orbie Pyo Colvin Caroli.Ed Risk analyst [phone] Office [phone]

## 2019-11-23 NOTE — Progress Notes (Signed)
Patient ID: [name], male   DOB: [birth year], 84 y.o.   MRN: [id number] EVENING ROUNDS NOTE :     [address]       [place] [address]             [phone]                 3 Days Post-Op Procedure(s) (LRB): CORONARY ARTERY BYPASS GRAFTING (CABG), ON PUMP, TIMES THREE, USING LEFT INTERNAL MAMMARY ARTERY AND ENDOSCOPICALLY HARVESTED RIGHT GREATER SAPHENOUS VEIN (N/A) TRANSESOPHAGEAL ECHOCARDIOGRAM (TEE) (N/A)  Total Length of Stay:  LOS: 6 days  BP 106/72 (BP Location: Left Arm)   Pulse 89   Temp 97.7 F (36.5 C) (Oral)   Resp (!) 28   Ht 5' 5.98" (1.676 m)   Wt 75.8 kg   SpO2 100%   BMI 26.99 kg/m   .Intake/Output      04/18 0701 - 04/19 0700 04/19 0701 - 04/20 0700   P.O.  180   I.V. (mL/kg) 3444.8 (45.4) 673.2 (8.9)   Blood     Other     NG/GT 30    IV Piggyback 208.1 152.4   Total Intake(mL/kg) 3682.8 (48.6) 1005.6 (13.3)   Urine (mL/kg/hr) 5030 (2.8) 2150 (2.7)   Emesis/NG output 50    Chest Tube 350 160   Total Output 5430 2310   Net -1747.2 -1304.4          . sodium chloride Stopped (11/23/19 0926)  . sodium chloride    . sodium chloride    . sodium chloride 20 mL/hr at 11/22/19 1900  . amiodarone 30 mg/hr (11/23/19 1600)  . cefTRIAXone (ROCEPHIN)  IV Stopped (11/23/19 0915)  . epinephrine 3 mcg/min (11/23/19 1513)  . furosemide (LASIX) infusion 6 mg/hr (11/23/19 1600)  . lactated ringers 20 mL/hr at 11/23/19 1600  . lactated ringers 20 mL/hr at 11/20/19 2100  . milrinone 0.5 mcg/kg/min (11/23/19 1320)  . nitroGLYCERIN    . norepinephrine (LEVOPHED) Adult infusion 5 mcg/min (11/22/19 1137)     Lab Results  Component Value Date   WBC 12.3 (H) 11/23/2019   HGB 10.9 (L) 11/23/2019   HCT 32.0 (L) 11/23/2019   PLT 144 (L) 11/23/2019   GLUCOSE 149 (H) 11/23/2019   CHOL 108 11/18/2019   TRIG 44 11/18/2019   HDL 43 11/18/2019   LDLCALC 56 11/18/2019   ALT 23 11/21/2019   AST 30 11/21/2019   NA 139 11/23/2019   K 3.3 (L)  11/23/2019   CL 99 11/23/2019   CREATININE 1.09 11/23/2019   BUN 12 11/23/2019   CO2 29 11/23/2019   TSH 2.571 11/17/2019   INR 1.6 (H) 11/20/2019   HGBA1C 6.0 (H) 06/30/2019   complains of right elbow pain from fall three weeks ago Stable on current drips    Delight Ovens MD  Beeper [phone] Office [phone] 11/23/2019 5:35 PM

## 2019-11-23 NOTE — Progress Notes (Signed)
Assisted tele visit to patient with family member.  Armando Lauman D Kendyl Bissonnette, RN   

## 2019-11-23 NOTE — Progress Notes (Signed)
   Still intubated. H/O prior CVA.  Rhythm stable on IV amio.  Available to help as needed.

## 2019-11-23 NOTE — Progress Notes (Signed)
Pt place on cpap/ PS at 5/5 40%. Pt apneic at 9-7 breaths per min. Pt returned to full support.

## 2019-11-24 ENCOUNTER — Inpatient Hospital Stay (HOSPITAL_COMMUNITY): Payer: Medicare PPO

## 2019-11-24 ENCOUNTER — Telehealth: Payer: Self-pay

## 2019-11-24 LAB — POCT I-STAT 7, (LYTES, BLD GAS, ICA,H+H)
Acid-Base Excess: 10 mmol/L — ABNORMAL HIGH (ref 0.0–2.0)
Bicarbonate: 33.6 mmol/L — ABNORMAL HIGH (ref 20.0–28.0)
Calcium, Ion: 1.17 mmol/L (ref 1.15–1.40)
HCT: 36 % — ABNORMAL LOW (ref 39.0–52.0)
Hemoglobin: 12.2 g/dL — ABNORMAL LOW (ref 13.0–17.0)
O2 Saturation: 99 %
Patient temperature: 98.3
Potassium: 3.6 mmol/L (ref 3.5–5.1)
Sodium: 134 mmol/L — ABNORMAL LOW (ref 135–145)
TCO2: 35 mmol/L — ABNORMAL HIGH (ref 22–32)
pCO2 arterial: 41.8 mmHg (ref 32.0–48.0)
pH, Arterial: 7.512 — ABNORMAL HIGH (ref 7.350–7.450)
pO2, Arterial: 150 mmHg — ABNORMAL HIGH (ref 83.0–108.0)

## 2019-11-24 LAB — BASIC METABOLIC PANEL
Anion gap: 11 (ref 5–15)
BUN: 13 mg/dL (ref 8–23)
CO2: 29 mmol/L (ref 22–32)
Calcium: 8.6 mg/dL — ABNORMAL LOW (ref 8.9–10.3)
Chloride: 95 mmol/L — ABNORMAL LOW (ref 98–111)
Creatinine, Ser: 0.97 mg/dL (ref 0.61–1.24)
GFR calc Af Amer: >60 mL/min (ref >60)
GFR calc non Af Amer: >60 mL/min (ref >60)
Glucose, Bld: 131 mg/dL — ABNORMAL HIGH (ref 70–99)
Potassium: 3.6 mmol/L (ref 3.5–5.1)
Sodium: 135 mmol/L (ref 135–145)

## 2019-11-24 LAB — COOXEMETRY PANEL
Carboxyhemoglobin: 1.7 % — ABNORMAL HIGH (ref 0.5–1.5)
Carboxyhemoglobin: 1.3 % (ref 0.5–1.5)
Methemoglobin: 1.2 % (ref 0.0–1.5)
Methemoglobin: 0.8 % (ref 0.0–1.5)
O2 Saturation: 66.9 %
O2 Saturation: 63.3 %
Total hemoglobin: 12.4 g/dL (ref 12.0–16.0)
Total hemoglobin: 12.1 g/dL (ref 12.0–16.0)

## 2019-11-24 LAB — GLUCOSE, CAPILLARY
Glucose-Capillary: 118 mg/dL — ABNORMAL HIGH (ref 70–99)
Glucose-Capillary: 101 mg/dL — ABNORMAL HIGH (ref 70–99)
Glucose-Capillary: 84 mg/dL (ref 70–99)
Glucose-Capillary: 96 mg/dL (ref 70–99)
Glucose-Capillary: 75 mg/dL (ref 70–99)

## 2019-11-24 LAB — CBC
HCT: 37.1 % — ABNORMAL LOW (ref 39.0–52.0)
Hemoglobin: 12.2 g/dL — ABNORMAL LOW (ref 13.0–17.0)
MCH: 30.5 pg (ref 26.0–34.0)
MCHC: 32.9 g/dL (ref 30.0–36.0)
MCV: 92.8 fL (ref 80.0–100.0)
Platelets: 157 10*3/uL (ref 150–400)
RBC: 4 MIL/uL — ABNORMAL LOW (ref 4.22–5.81)
RDW: 17.2 % — ABNORMAL HIGH (ref 11.5–15.5)
WBC: 10.5 10*3/uL (ref 4.0–10.5)
nRBC: 0.4 % — ABNORMAL HIGH (ref 0.0–0.2)

## 2019-11-24 LAB — CULTURE, RESPIRATORY W GRAM STAIN
Culture: NO GROWTH
Gram Stain: NONE SEEN
Special Requests: NORMAL

## 2019-11-24 MED ORDER — ACETAMINOPHEN 10 MG/ML IV SOLN
1000.0000 mg | Freq: Four times a day (QID) | INTRAVENOUS | Status: AC
  Administered 2019-11-24 – 2019-11-25 (×3): 1000 mg via INTRAVENOUS
  Filled 2019-11-24 (×4): qty 100

## 2019-11-24 MED ORDER — FUROSEMIDE 10 MG/ML IJ SOLN
40.0000 mg | Freq: Two times a day (BID) | INTRAMUSCULAR | Status: DC
  Administered 2019-11-24 – 2019-11-27 (×7): 40 mg via INTRAVENOUS
  Filled 2019-11-24 (×8): qty 4

## 2019-11-24 MED ORDER — MILRINONE LACTATE IN DEXTROSE 20-5 MG/100ML-% IV SOLN
0.2500 ug/kg/min | INTRAVENOUS | Status: DC
  Administered 2019-11-24: 0.25 ug/kg/min via INTRAVENOUS

## 2019-11-24 MED ORDER — CLOPIDOGREL BISULFATE 75 MG PO TABS
75.0000 mg | ORAL_TABLET | Freq: Every day | ORAL | Status: DC
  Administered 2019-11-24 – 2019-12-06 (×13): 75 mg via ORAL
  Filled 2019-11-24 (×13): qty 1

## 2019-11-24 MED ORDER — POTASSIUM CHLORIDE 10 MEQ/50ML IV SOLN
10.0000 meq | INTRAVENOUS | Status: AC
  Administered 2019-11-24 (×3): 10 meq via INTRAVENOUS
  Filled 2019-11-24: qty 50

## 2019-11-24 MED ORDER — AMIODARONE HCL 200 MG PO TABS
400.0000 mg | ORAL_TABLET | Freq: Two times a day (BID) | ORAL | Status: DC
  Administered 2019-11-24 – 2019-11-28 (×9): 400 mg via ORAL
  Filled 2019-11-24 (×9): qty 2

## 2019-11-24 MED ORDER — ROSUVASTATIN CALCIUM 20 MG PO TABS
20.0000 mg | ORAL_TABLET | Freq: Every day | ORAL | Status: DC
  Administered 2019-11-24 – 2019-12-06 (×13): 20 mg via ORAL
  Filled 2019-11-24 (×6): qty 1
  Filled 2019-11-24: qty 4
  Filled 2019-11-24 (×6): qty 1

## 2019-11-24 NOTE — Evaluation (Signed)
Physical Therapy Evaluation Patient Details Name: [name] MRN: [id number] DOB: 12/23/1935 Today's Date: 11/24/2019   History of Present Illness  Pt is 84 yo male with PMH including HTN, HLD, CVA in 2001 w R sided weakness, bil carotid disease, PAD, R BBB, and sinus bradcardia who came to ED with chest pain.  Pt admitted with NSTEMI and CHF and is now s/p CABG x3 on 11/20/18.  Pt was intubated 4/16-4/19/20.  Clinical Impression  Pt admitted with above diagnosis. Pt currently requiring total assist of 2 for transfers and mobility.  Pt limited due to prior R sided weakness from CVA and now with sternal precautions.  Pt required increased multimodal cues but motivated to work with therapy.  Pt was unable to stand from chair with assist of 1, session focused on improving extremity and core strength for transfers as well as balance. All VSS during therapy. Pt currently with functional limitations due to the deficits listed below (see PT Problem List). Pt will benefit from skilled PT to increase their independence and safety with mobility to allow discharge to the venue listed below.       Follow Up Recommendations SNF    Equipment Recommendations  Other (comment)(TBD by next venue)    Recommendations for Other Services       Precautions / Restrictions Precautions Precautions: Fall;Sternal Precaution Comments: HOH Other Brace: AFO RLE      Mobility  Bed Mobility Overal bed mobility: Needs Assistance             General bed mobility comments: Nursing had pt up to chair at arrival; reports strong assist of 2  Transfers                 General transfer comment: Attempted standing from chair, but unable with assist of 1  Ambulation/Gait             General Gait Details: defered  Stairs            Wheelchair Mobility    Modified Rankin (Stroke Patients Only)       Balance Overall balance assessment: History of Falls;Needs assistance Sitting-balance  support: Feet supported Sitting balance-Leahy Scale: Fair Sitting balance - Comments: worked on sitting edge of chair without back support - able to maintain but could not tolerate challange       Standing balance comment: unable                             Pertinent Vitals/Pain Pain Assessment: No/denies pain    Home Living Family/patient expects to be discharged to:: Private residence Living Arrangements: Alone Available Help at Discharge: Family(niece lives next door) Type of Home: House Home Access: Ramped entrance     Home Layout: One level Home Equipment: Cane - single point;Shower seat;Grab bars - toilet;Grab bars - tub/shower Additional Comments: From prior admission    Prior Function Level of Independence: Needs assistance   Gait / Transfers Assistance Needed: patient modified independent using cane for mobility  for househhold mobility  ADL's / Homemaking Assistance Needed: pt independent with ADLs, bathing in shower and dressing independently  Comments: PLOF from prior admission 2 weeks ago and pt confirmed today     Hand Dominance        Extremity/Trunk Assessment   Upper Extremity Assessment Upper Extremity Assessment: LUE deficits/detail;RUE deficits/detail RUE Deficits / Details: Pt with flexion contractures from prior CVA LUE Deficits / Details: [facility]  Lower Extremity Assessment Lower Extremity Assessment: LLE deficits/detail;RLE deficits/detail RLE Deficits / Details: ROM WFL except ankle DF to neutral only; MMT hip and knee 3/5, ankle 1/5; noted pitting edema LLE Deficits / Details: ROM WFL; MMT 5/5    Cervical / Trunk Assessment Cervical / Trunk Assessment: Normal  Communication   Communication: HOH  Cognition Arousal/Alertness: Awake/alert Behavior During Therapy: WFL for tasks assessed/performed Overall Cognitive Status: Difficult to assess                                 General Comments: Difficult to assess  due to [facility] and some dysarthria; did require multimodal cues      General Comments General comments (skin integrity, edema, etc.): Pt's VSS throughout session on RA    Exercises General Exercises - Lower Extremity Gluteal Sets: AROM;Both;10 reps;Seated Long Arc Quad: AROM;Both;10 reps;Seated Hip Flexion/Marching: AROM;Both;10 reps;Seated Other Exercises Other Exercises: Worked on trunk control - moving from leaning on back of chair to "nose over toes" performed x 10 for exercise plus and additional 8 during session   Assessment/Plan    PT Assessment Patient needs continued PT services  PT Problem List Decreased strength;Decreased activity tolerance;Decreased balance;Decreased mobility;Decreased coordination;Decreased cognition;Decreased knowledge of use of DME;Decreased safety awareness;Decreased knowledge of precautions;Pain;Cardiopulmonary status limiting activity       PT Treatment Interventions DME instruction;Gait training;Functional mobility training;Therapeutic activities;Therapeutic exercise;Balance training;Neuromuscular re-education;Patient/family education    PT Goals (Current goals can be found in the Care Plan section)  Acute Rehab PT Goals PT Goal Formulation: Patient unable to participate in goal setting Time For Goal Achievement: 12/08/19 Potential to Achieve Goals: Good    Frequency Min 2X/week   Barriers to discharge Decreased caregiver support      Co-evaluation               AM-PAC PT "6 Clicks" Mobility  Outcome Measure Help needed turning from your back to your side while in a flat bed without using bedrails?: Total Help needed moving from lying on your back to sitting on the side of a flat bed without using bedrails?: Total Help needed moving to and from a bed to a chair (including a wheelchair)?: Total Help needed standing up from a chair using your arms (e.g., wheelchair or bedside chair)?: Total Help needed to walk in hospital room?:  Total Help needed climbing 3-5 steps with a railing? : Total 6 Click Score: 6    End of Session Equipment Utilized During Treatment: Gait belt Activity Tolerance: Patient limited by fatigue Patient left: with call bell/phone within reach;in chair(feet elevated) Nurse Communication: Mobility status;Need for lift equipment(may need lift vs slide back to bed due to lower height of chair) PT Visit Diagnosis: Other abnormalities of gait and mobility (R26.89);Unsteadiness on feet (R26.81);History of falling (Z91.81);Muscle weakness (generalized) (M62.81)    Time: 1610-1640 PT Time Calculation (min) (ACUTE ONLY): 30 min   Charges:   PT Evaluation $PT Eval Moderate Complexity: 1 Mod PT Treatments $Therapeutic Exercise: 8-22 mins        Maggie Font, PT Acute Rehab Services Pager [phone] [facility] [phone] [facility] [phone]   Karlton Lemon 11/24/2019, 5:34 PM

## 2019-11-24 NOTE — Plan of Care (Signed)
Pt doing ok. Pleasant affect. Kept on AAI at 88 per Dr. Rulon Abide occasionally intrinsically paces over it. Started low-dose epi overnight to maintain pressure goals, but anticipate that it won't be needed when pt is awake. UOP good. Complains of severe pain with movement. 2 RNs attempted to ambulate pt but aggressive PT will be most beneficial. Pt only able to take 2 steps before sliding and buckling at his knees, especially on R side. Will attempt again in AM. CT output has slowed--dressings pulled back to assess for clots (none noted). Will continue to monitor. Problem: Clinical Measurements: Goal: Ability to maintain clinical measurements within normal limits will improve Outcome: Progressing Goal: Diagnostic test results will improve Outcome: Progressing Goal: Respiratory complications will improve Outcome: Progressing Goal: Cardiovascular complication will be avoided Outcome: Progressing   Problem: Coping: Goal: Level of anxiety will decrease Outcome: Progressing   Problem: Elimination: Goal: Will not experience complications related to bowel motility Outcome: Progressing Goal: Will not experience complications related to urinary retention Outcome: Progressing   Problem: Activity: Goal: Risk for activity intolerance will decrease Outcome: Not Progressing

## 2019-11-24 NOTE — Progress Notes (Signed)
   Extubated.  Sitting in bed.  NG tube in place.  Asleep.  O2 sats are adequate.  Normal sinus rhythm.  Improving.

## 2019-11-24 NOTE — Progress Notes (Addendum)
A-line and chest tubes removed successfully per Dr. Vickey Sages order. Speech has arranged for a barium swallow tomorrow morning, 11/25/2019.

## 2019-11-24 NOTE — Progress Notes (Signed)
  Speech Language Pathology Treatment: Dysphagia  Patient Details Name: [name] MRN: [id number] DOB: 01/02/1936 Today's Date: 11/24/2019 Time: 1000-1030 SLP Time Calculation (min) (ACUTE ONLY): 30 min  Assessment / Plan / Recommendation Clinical Impression  Pt seen this morning for possibility of po initiation. Niece arrived during po trials and updated, with pt's permission, re: swallow ability thus far. He exhibited increased s/s aspiration today with thin, nectar thick and puree texture with immediate and delayed cough/throat clears (decreased with puree). Instrumental assessment recommended and MBS scheduled today at 1330 (unable to do FEES today). Can continue meds crushed in puree until MBS.   HPI HPI: [name] is a 84 y.o. male with PMH:  HTN, HLD, CVA in 2001 in right sided weakness, prediabetes, B/L carotid artery disease,  PAD, RBBB, sinus bradycardia who arrived to ED with chest pain. Found to have NSTEMI and  intubated 4/16-4/19 for CABG on 4/16.       SLP Plan  MBS       Recommendations  Diet recommendations: Other(comment)(meds crushed) Medication Administration: Crushed with puree                Oral Care Recommendations: Oral care QID Follow up Recommendations: Other (comment)(TBD) SLP Visit Diagnosis: Dysphagia, unspecified (R13.10) Plan: MBS                       Camia Dipinto, Breck Coons 11/24/2019, 11:01 AM   Breck Coons Lonell Face.Ed Nurse, children's [phone] Office [phone]

## 2019-11-24 NOTE — Progress Notes (Signed)
Assisted tele visit to patient with family member.  Kory Rains D Reese Senk, RN   

## 2019-11-24 NOTE — Progress Notes (Addendum)
EVENING ROUNDS NOTE :     [address]       [place] [address]             [phone]                 4 Days Post-Op Procedure(s) (LRB): CORONARY ARTERY BYPASS GRAFTING (CABG), ON PUMP, TIMES THREE, USING LEFT INTERNAL MAMMARY ARTERY AND ENDOSCOPICALLY HARVESTED RIGHT GREATER SAPHENOUS VEIN (N/A) TRANSESOPHAGEAL ECHOCARDIOGRAM (TEE) (N/A)  Total Length of Stay:  LOS: 7 days  BP 103/66   Pulse 74   Temp 98.1 F (36.7 C) (Oral)   Resp 19   Ht 5' 5.98" (1.676 m)   Wt 75.5 kg   SpO2 (!) 85%   BMI 26.88 kg/m   .Intake/Output      04/20 0701 - 04/21 0700   I.V. (mL/kg) 2612 (34.6)   IV Piggyback 260.1   Total Intake(mL/kg) 2872.1 (38)   Urine (mL/kg/hr) 825 (0.9)   Chest Tube    Total Output 825   Net +2047.1         . sodium chloride Stopped (11/23/19 0926)  . sodium chloride    . sodium chloride    . sodium chloride 20 mL/hr at 11/24/19 1300  . acetaminophen 400 mL/hr at 11/24/19 1800  . lactated ringers Stopped (11/24/19 0918)  . lactated ringers 800 mL (11/24/19 1750)  . milrinone 0.25 mcg/kg/min (11/24/19 0736)     Lab Results  Component Value Date   WBC 10.5 11/24/2019   HGB 12.2 (L) 11/24/2019   HCT 37.1 (L) 11/24/2019   PLT 157 11/24/2019   GLUCOSE 131 (H) 11/24/2019   CHOL 108 11/18/2019   TRIG 44 11/18/2019   HDL 43 11/18/2019   LDLCALC 56 11/18/2019   ALT 23 11/21/2019   AST 30 11/21/2019   NA 135 11/24/2019   K 3.6 11/24/2019   CL 95 (L) 11/24/2019   CREATININE 0.97 11/24/2019   BUN 13 11/24/2019   CO2 29 11/24/2019   TSH 2.571 11/17/2019   INR 1.6 (H) 11/20/2019   HGBA1C 6.0 (H) 06/30/2019    1 Pacer off with intrinsic rhythm sinus in 80's, now on po amio 2 pain control improving with IV tylenol 3 stable progress overall   Wayne E Gold, PA-C   I have seen and examined the patient and agree with the assessment and plan as outlined.  Purcell Nails, MD 11/24/2019 10:24 PM

## 2019-11-24 NOTE — Progress Notes (Signed)
4 Days Post-Op Procedure(s) (LRB): CORONARY ARTERY BYPASS GRAFTING (CABG), ON PUMP, TIMES THREE, USING LEFT INTERNAL MAMMARY ARTERY AND ENDOSCOPICALLY HARVESTED RIGHT GREATER SAPHENOUS VEIN (N/A) TRANSESOPHAGEAL ECHOCARDIOGRAM (TEE) (N/A) Subjective: No complaints  Objective: Vital signs in last 24 hours: Temp:  [96.6 F (35.9 C)-97.8 F (36.6 C)] 97.7 F (36.5 C) (04/20 0400) Pulse Rate:  [86-93] 93 (04/20 0700) Cardiac Rhythm: Atrial paced (04/20 0400) Resp:  [10-28] 15 (04/20 0700) BP: (88-109)/(57-74) 106/72 (04/20 0700) SpO2:  [78 %-100 %] 100 % (04/20 0700) Arterial Line BP: (76-136)/(48-81) 134/81 (04/20 0700) FiO2 (%):  [40 %] 40 % (04/19 0845)  Hemodynamic parameters for last 24 hours: PAP: (33-36)/(18-19) 36/18 CVP:  [7 mmHg-10 mmHg] 10 mmHg  Intake/Output from previous day: 04/19 0701 - 04/20 0700 In: 1684.6 [I.V.:1428.1; IV Piggyback:256.5] Out: 4070 [Urine:3670; Chest Tube:400] Intake/Output this shift: No intake/output data recorded.  General appearance: alert and cooperative Neurologic: intact Heart: regular rate and rhythm, S1, S2 normal, no murmur, click, rub or gallop Lungs: clear to auscultation bilaterally Extremities: edema minimal Wound: c/d/i  Lab Results: Recent Labs    11/23/19 0320 11/23/19 0912 11/24/19 0517 11/24/19 0521  WBC 12.3*  --   --  10.5  HGB 11.6*  11.6*   < > 12.2* 12.2*  HCT 34.8*  34.0*   < > 36.0* 37.1*  PLT 144*  --   --  157   < > = values in this interval not displayed.   BMET:  Recent Labs    11/23/19 0320 11/23/19 0912 11/24/19 0517 11/24/19 0521  NA 138  137   < > 134* 135  K 4.0  4.0   < > 3.6 3.6  CL 99  --   --  95*  CO2 29  --   --  29  GLUCOSE 149*  --   --  131*  BUN 12  --   --  13  CREATININE 1.09  --   --  0.97  CALCIUM 8.4*  --   --  8.6*   < > = values in this interval not displayed.    PT/INR: No results for input(s): LABPROT, INR in the last 72 hours. ABG    Component Value  Date/Time   PHART 7.512 (H) 11/24/2019 0517   HCO3 33.6 (H) 11/24/2019 0517   TCO2 35 (H) 11/24/2019 0517   ACIDBASEDEF 1.0 11/21/2019 0502   O2SAT 66.9 11/24/2019 0609   CBG (last 3)  Recent Labs    11/23/19 2011 11/23/19 2347 11/24/19 0406  GLUCAP 84 104* 118*    Assessment/Plan: S/P Procedure(s) (LRB): CORONARY ARTERY BYPASS GRAFTING (CABG), ON PUMP, TIMES THREE, USING LEFT INTERNAL MAMMARY ARTERY AND ENDOSCOPICALLY HARVESTED RIGHT GREATER SAPHENOUS VEIN (N/A) TRANSESOPHAGEAL ECHOCARDIOGRAM (TEE) (N/A) Mobilize Diuresis PT/OT   LOS: 7 days    Linden Dolin 11/24/2019

## 2019-11-24 NOTE — Progress Notes (Signed)
SLP Cancellation Note  Patient Details Name: [name] MRN: [id number] DOB: 10-Oct-1935   Cancelled treatment:        Notified by RN re: no availability to accompany pt to [facility] suite and transport was cancelled and planned for tomorrow morning. RN paged back and now able to go with pt to study but unfortunately transport staff was cancelled for Mr. Coia and now busy with another pt. MBS will be planned for tomorrow morning (not before 0800 however).   Royce Macadamia 11/24/2019, 1:14 PM   Breck Coons Lonell Face.Ed Nurse, children's [phone] Office [phone]

## 2019-11-25 ENCOUNTER — Telehealth: Payer: Self-pay

## 2019-11-25 ENCOUNTER — Inpatient Hospital Stay (HOSPITAL_COMMUNITY): Payer: Medicare PPO

## 2019-11-25 ENCOUNTER — Ambulatory Visit: Payer: Self-pay

## 2019-11-25 DIAGNOSIS — I639 Cerebral infarction, unspecified: Secondary | ICD-10-CM

## 2019-11-25 DIAGNOSIS — I214 Non-ST elevation (NSTEMI) myocardial infarction: Secondary | ICD-10-CM

## 2019-11-25 DIAGNOSIS — I10 Essential (primary) hypertension: Secondary | ICD-10-CM

## 2019-11-25 DIAGNOSIS — R7303 Prediabetes: Secondary | ICD-10-CM

## 2019-11-25 LAB — BASIC METABOLIC PANEL
Anion gap: 12 (ref 5–15)
BUN: 15 mg/dL (ref 8–23)
CO2: 29 mmol/L (ref 22–32)
Calcium: 8.6 mg/dL — ABNORMAL LOW (ref 8.9–10.3)
Chloride: 96 mmol/L — ABNORMAL LOW (ref 98–111)
Creatinine, Ser: 0.94 mg/dL (ref 0.61–1.24)
GFR calc Af Amer: >60 mL/min (ref >60)
GFR calc non Af Amer: >60 mL/min (ref >60)
Glucose, Bld: 96 mg/dL (ref 70–99)
Potassium: 3.3 mmol/L — ABNORMAL LOW (ref 3.5–5.1)
Sodium: 137 mmol/L (ref 135–145)

## 2019-11-25 LAB — COOXEMETRY PANEL
Carboxyhemoglobin: 2.4 % — ABNORMAL HIGH (ref 0.5–1.5)
Methemoglobin: 1 % (ref 0.0–1.5)
O2 Saturation: 66.4 %
Total hemoglobin: 12.6 g/dL (ref 12.0–16.0)

## 2019-11-25 LAB — GLUCOSE, CAPILLARY
Glucose-Capillary: 107 mg/dL — ABNORMAL HIGH (ref 70–99)
Glucose-Capillary: 112 mg/dL — ABNORMAL HIGH (ref 70–99)
Glucose-Capillary: 114 mg/dL — ABNORMAL HIGH (ref 70–99)
Glucose-Capillary: 122 mg/dL — ABNORMAL HIGH (ref 70–99)
Glucose-Capillary: 83 mg/dL (ref 70–99)
Glucose-Capillary: 85 mg/dL (ref 70–99)
Glucose-Capillary: 93 mg/dL (ref 70–99)

## 2019-11-25 LAB — CBC
HCT: 37.4 % — ABNORMAL LOW (ref 39.0–52.0)
Hemoglobin: 12.2 g/dL — ABNORMAL LOW (ref 13.0–17.0)
MCH: 30.6 pg (ref 26.0–34.0)
MCHC: 32.6 g/dL (ref 30.0–36.0)
MCV: 93.7 fL (ref 80.0–100.0)
Platelets: 169 10*3/uL (ref 150–400)
RBC: 3.99 MIL/uL — ABNORMAL LOW (ref 4.22–5.81)
RDW: 16.5 % — ABNORMAL HIGH (ref 11.5–15.5)
WBC: 8.7 10*3/uL (ref 4.0–10.5)
nRBC: 0 % (ref 0.0–0.2)

## 2019-11-25 MED ORDER — POTASSIUM CHLORIDE 10 MEQ/50ML IV SOLN
10.0000 meq | INTRAVENOUS | Status: AC
  Administered 2019-11-25 (×3): 10 meq via INTRAVENOUS
  Filled 2019-11-25 (×3): qty 50

## 2019-11-25 MED ORDER — ENSURE ENLIVE PO LIQD
237.0000 mL | Freq: Three times a day (TID) | ORAL | Status: DC
  Administered 2019-11-26 – 2019-12-05 (×14): 237 mL via ORAL

## 2019-11-25 MED ORDER — STARCH (THICKENING) PO POWD
ORAL | Status: DC | PRN
  Filled 2019-11-25: qty 227

## 2019-11-25 MED FILL — Heparin Sodium (Porcine) Inj 1000 Unit/ML: INTRAMUSCULAR | Qty: 30 | Status: AC

## 2019-11-25 MED FILL — Magnesium Sulfate Inj 50%: INTRAMUSCULAR | Qty: 10 | Status: AC

## 2019-11-25 MED FILL — Potassium Chloride Inj 2 mEq/ML: INTRAVENOUS | Qty: 40 | Status: AC

## 2019-11-25 NOTE — Progress Notes (Signed)
RT Note:  No BIPAP needed at this time.

## 2019-11-25 NOTE — Progress Notes (Signed)
      [address]       [place] [address]             [phone]      POD # 4 CABG x 3  BP 105/60   Pulse 79   Temp 98.5 F (36.9 C) (Oral)   Resp (!) 23   Ht 5' 5.98" (1.676 m)   Wt 71.9 kg   SpO2 98%   BMI 25.60 kg/m   RA 98% sat   Intake/Output Summary (Last 24 hours) at 11/25/2019 1824 Last data filed at 11/25/2019 1400 Gross per 24 hour  Intake 550.61 ml  Output 2600 ml  Net -2049.39 ml   CBG well controlled  Dysphagia 2 diet  Viviann Spare C. Dorris Fetch, MD [facility] [phone]

## 2019-11-25 NOTE — NC FL2 (Signed)
[place] LEVEL OF CARE SCREENING TOOL     IDENTIFICATION  Patient Name: [name] Birthdate: 27-Dec-1935 Sex: male Admission Date (Current Location): 11/17/2019  [facility] and [place] Number:  Herbalist and Address:  [facility] [place], [place] [address], [place], [place] [address]      Provider Number: [id number]  Attending Physician Name and Address:  Wonda Olds, MD  Relative Name and Phone Number:  [name] [phone]    Current Level of Care: Hospital Recommended Level of Care: [place] Prior Approval Number:    Date Approved/Denied: 11/25/19 PASRR Number: [id number]  Discharge Plan: SNF    Current Diagnoses: Patient Active Problem List   Diagnosis Date Noted  . S/P CABG x 3 11/20/2019  . Pressure injury of skin 11/18/2019  . NSTEMI (non-ST elevated myocardial infarction) ([place]) 11/17/2019  . Elevated troponin 11/03/2019  . PVD (peripheral vascular disease) ([place]) 11/03/2019  . History of CVA with residual deficit 11/03/2019  . Elevated CK 11/03/2019  . Fall at home, initial encounter 11/03/2019  . Prediabetes 11/03/2019  . Essential hypertension 11/03/2019  . PAD (peripheral artery disease) ([place]) 05/11/2019    Orientation RESPIRATION BLADDER Height & Weight     Self, Time, Situation, Place  Normal Continent, External catheter Weight: 158 lb 8.2 oz (71.9 kg) Height:  5' 5.98" (167.6 cm)  BEHAVIORAL SYMPTOMS/MOOD NEUROLOGICAL BOWEL NUTRITION STATUS      Continent Diet(See Discharge Summary)  AMBULATORY STATUS COMMUNICATION OF NEEDS Skin   Extensive Assist Verbally Other (Comment)(Approp. for ethnicity dry Eccymosis Skin tear arm;hand; left poterior,rt., left, lower;skin tear  rt. arm skin turgor non-tenting,pressure injury sacrum medial,mid stage 1 intact  incision closed right leg incision closed chest incision closed groin left)                       Personal Care Assistance Level of  Assistance  Bathing, Feeding, Dressing Bathing Assistance: Maximum assistance Feeding assistance: Independent(able to feed self;assist with sitting up) Dressing Assistance: Maximum assistance     Functional Limitations Info  Sight, Speech, Hearing Sight Info: Impaired Hearing Info: Impaired Speech Info: Adequate(Hoarse)    SPECIAL CARE FACTORS FREQUENCY  OT (By licensed OT), PT (By licensed PT)     PT Frequency: 5x min weekly OT Frequency: 5x min weekly            Contractures Contractures Info: Not present    Additional Factors Info  Code Status Code Status Info: FULL             Current Medications (11/25/2019):  This is the current hospital active medication list Current Facility-Administered Medications  Medication Dose Route Frequency Provider Last Rate Last Admin  . 0.45 % sodium chloride infusion   Intravenous Continuous PRN Barrett, Erin R, PA-C   Stopped at 11/23/19 0926  . 0.9 %  sodium chloride infusion  250 mL Intravenous PRN Larae Grooms S, MD      . 0.9 %  sodium chloride infusion  250 mL Intravenous Continuous Barrett, Erin R, PA-C      . 0.9 %  sodium chloride infusion   Intravenous Continuous Barrett, Erin R, PA-C 20 mL/hr at 11/24/19 1300 Rate Verify at 11/24/19 1300  . acetaminophen (TYLENOL) tablet 1,000 mg  1,000 mg Oral Q6H Barrett, Erin R, PA-C   1,000 mg at 11/23/19 1735   Or  . acetaminophen (TYLENOL) 160 MG/5ML solution 1,000 mg  1,000 mg Per Tube  Q6H Barrett, Erin R, PA-C   1,000 mg at 11/23/19 0501  . acetaminophen (TYLENOL) tablet 650 mg  650 mg Oral Q4H PRN Corky Crafts, MD   650 mg at 11/20/19 0004  . alum & mag hydroxide-simeth (MAALOX/MYLANTA) 200-200-20 MG/5ML suspension 30 mL  30 mL Oral Q2H PRN Corky Crafts, MD   30 mL at 11/17/19 2318  . amiodarone (PACERONE) tablet 400 mg  400 mg Oral BID Linden Dolin, MD   400 mg at 11/25/19 1057  . aspirin EC tablet 81 mg  81 mg Oral Daily Linden Dolin, MD   81 mg at  11/25/19 1058  . bisacodyl (DULCOLAX) EC tablet 10 mg  10 mg Oral Daily Barrett, Erin R, PA-C   10 mg at 11/25/19 1057   Or  . bisacodyl (DULCOLAX) suppository 10 mg  10 mg Rectal Daily Barrett, Erin R, PA-C      . chlorhexidine (PERIDEX) 0.12 % solution 15 mL  15 mL Mouth Rinse BID Linden Dolin, MD   15 mL at 11/24/19 2152  . Chlorhexidine Gluconate Cloth 2 % PADS 6 each  6 each Topical Daily Corky Crafts, MD   6 each at 11/24/19 1629  . clopidogrel (PLAVIX) tablet 75 mg  75 mg Oral Daily Linden Dolin, MD   75 mg at 11/25/19 1057  . docusate sodium (COLACE) capsule 200 mg  200 mg Oral Daily Barrett, Erin R, PA-C   200 mg at 11/25/19 1058  . feeding supplement (ENSURE ENLIVE) (ENSURE ENLIVE) liquid 237 mL  237 mL Oral BID BM Barrett, Erin R, PA-C   237 mL at 11/25/19 1059  . furosemide (LASIX) injection 40 mg  40 mg Intravenous BID Linden Dolin, MD   40 mg at 11/25/19 1057  . insulin aspart (novoLOG) injection 0-9 Units  0-9 Units Subcutaneous Q4H Atkins, Broadus Z, MD      . lactated ringers infusion   Intravenous Continuous Barrett, Erin R, PA-C   Stopped at 11/24/19 0918  . lactated ringers infusion   Intravenous Continuous Barrett, Erin R, PA-C   Stopped at 11/25/19 0519  . levalbuterol (XOPENEX) nebulizer solution 0.63 mg  0.63 mg Nebulization Once Corky Crafts, MD      . magnesium hydroxide (MILK OF MAGNESIA) suspension 30 mL  30 mL Oral Daily PRN Corky Crafts, MD   30 mL at 11/19/19 1010  . MEDLINE mouth rinse  15 mL Mouth Rinse q12n4p Linden Dolin, MD   15 mL at 11/24/19 1741  . metoprolol tartrate (LOPRESSOR) injection 2.5-5 mg  2.5-5 mg Intravenous Q2H PRN Barrett, Erin R, PA-C      . milrinone (PRIMACOR) 20 MG/100 ML (0.2 mg/mL) infusion  0.25 mcg/kg/min Intravenous Continuous Linden Dolin, MD   Stopped at 11/24/19 0804  . morphine 2 MG/ML injection 1-4 mg  1-4 mg Intravenous Q1H PRN Barrett, Erin R, PA-C   2 mg at 11/24/19 0640  .  ondansetron (ZOFRAN) injection 4 mg  4 mg Intravenous Q6H PRN Barrett, Erin R, PA-C      . pantoprazole (PROTONIX) EC tablet 40 mg  40 mg Oral Daily Barrett, Erin R, PA-C   40 mg at 11/25/19 1058  . rosuvastatin (CRESTOR) tablet 20 mg  20 mg Oral Daily Linden Dolin, MD   20 mg at 11/25/19 1057  . sodium chloride flush (NS) 0.9 % injection 10-40 mL  10-40 mL Intracatheter Q12H Vickey Sages, Merri Brunette, MD  10 mL at 11/25/19 1059  . sodium chloride flush (NS) 0.9 % injection 10-40 mL  10-40 mL Intracatheter PRN Atkins, Merri Brunette, MD      . sodium chloride flush (NS) 0.9 % injection 3 mL  3 mL Intravenous Once Lance Muss S, MD      . sodium chloride flush (NS) 0.9 % injection 3 mL  3 mL Intravenous Q12H Corky Crafts, MD   3 mL at 11/23/19 1053  . sodium chloride flush (NS) 0.9 % injection 3 mL  3 mL Intravenous Q12H Corky Crafts, MD   3 mL at 11/23/19 1057  . sodium chloride flush (NS) 0.9 % injection 3 mL  3 mL Intravenous PRN Lance Muss S, MD      . sodium chloride flush (NS) 0.9 % injection 3 mL  3 mL Intravenous Q12H Barrett, Erin R, PA-C   3 mL at 11/24/19 2158  . sodium chloride flush (NS) 0.9 % injection 3 mL  3 mL Intravenous PRN Barrett, Erin R, PA-C      . traMADol (ULTRAM) tablet 50-100 mg  50-100 mg Oral Q4H PRN Barrett, Erin R, PA-C   50 mg at 11/24/19 0820     Discharge Medications: Please see discharge summary for a list of discharge medications.  Relevant Imaging Results:  Relevant Lab Results:   Additional Information [id number]  Terrial Rhodes, LCSWA

## 2019-11-25 NOTE — Progress Notes (Signed)
Nutrition Follow-up  DOCUMENTATION CODES:   Not applicable  INTERVENTION:  Ensure Enlive po TID, each supplement provides 350 kcal and 20 grams of protein (Thicken to nectar-thick)  Magic cup BID with meals, each supplement provides 290 kcal and 9 grams of protein  NUTRITION DIAGNOSIS:   Inadequate oral intake related to poor appetite, acute illness as evidenced by per patient/family report.  Ongoing - addressing with nutrition interventions.  GOAL:   Patient will meet greater than or equal to 90% of their needs  Progressing.  MONITOR:   PO intake, Supplement acceptance, Labs, Weight trends  REASON FOR ASSESSMENT:   Malnutrition Screening Tool    ASSESSMENT:   84 yo admitted with NSTEMI with plans for CABG. PMH includes essential HTN, HLD, CVA   -Patient s/p CABGx3 on 4/16. -Following MBSS on 4/21 diet was advanced to dysphagia 2 with nectar thick liquids.  Patient was previously eating 25-50% of meals prior to CABG. According to chart patient only ate 10% of his lunch today. Will monitor for adequacy of intake following diet advancement today.  Medications reviewed and include: Colace 200 mg daily, Lasix 40 mg BID IV, Novolog 0-9 units Q4hrs.  Labs reviewed: CBG 85-114, Potassium 3.3, Chloride 96.  I/O: 2225 mL UOP yesterday (1.3 mL/kg/hr)  Weight trend: 71.9 kg on 4/21; -1 kg from 4/13  Diet Order:   Diet Order            DIET DYS 2 Room service appropriate? Yes with Assist; Fluid consistency: Nectar Thick  Diet effective now             EDUCATION NEEDS:   Education needs have been addressed  Skin:  Skin Assessment: Skin Integrity Issues: Skin Integrity Issues:: Incisions Stage I: sacrum Incisions: closed incnisions right leg, left groin, and chest  Last BM:  11/23/2019 per chart  Height:   Ht Readings from Last 1 Encounters:  11/20/19 5' 5.98" (1.676 m)   Weight:   Wt Readings from Last 1 Encounters:  11/25/19 71.9 kg   BMI:  Body mass  index is 25.6 kg/m.  Estimated Nutritional Needs:   Kcal:  1800-2000  Protein:  95-105 grams  Fluid:  >/= 1.8 L/day  Felix Pacini, MS, RD, LDN Pager number available on Amion

## 2019-11-25 NOTE — TOC Initial Note (Signed)
Transition of Care [facility]) - Initial/Assessment Note    Patient Details  Name: [name] MRN: [id number] Date of Birth: [birth year]  Transition of Care [facility]) CM/SW Contact:    Trula Ore, [place] Phone Number: 11/25/2019, 11:05 AM  Clinical Narrative:                  CSW spoke with patient at bedside. Patient was agreeable to SNF placement. Patient wants CSW to fax out SNF referral to [place] area. Patient gave CSW permission to discuss his care with [name] his niece.   Bed offers pending. CSW will start insurance authorization.   Expected Discharge Plan: Skilled Nursing Facility Barriers to Discharge: Continued Medical Work up   Patient Goals and CMS Choice Patient states their goals for this hospitalization and ongoing recovery are:: to go to skilled nursing facility CMS Medicare.gov Compare Post Acute Care list provided to:: Patient Choice offered to / list presented to : Patient  Expected Discharge Plan and Services Expected Discharge Plan: [place]       Living arrangements for the past 2 months: Single Family Home                                      Prior Living Arrangements/Services Living arrangements for the past 2 months: Single Family Home Lives with:: Self Patient language and need for interpreter reviewed:: Yes Do you feel safe going back to the place where you live?: No   to go to skilled nursing facility  Need for Family Participation in Patient Care: Yes (Comment) Care giver support system in place?: Yes (comment)   Criminal Activity/Legal Involvement Pertinent to Current Situation/Hospitalization: No - Comment as needed  Activities of Daily Living Home Assistive Devices/Equipment: Brace (specify type), Cane (specify quad or straight), Eyeglasses, Hearing aid(doesn't like wearing hearing aids nor eyeglasses) ADL Screening (condition at time of admission) Patient's cognitive ability adequate to safely complete daily  activities?: Yes Is the patient deaf or have difficulty hearing?: Yes Does the patient have difficulty seeing, even when wearing glasses/contacts?: No Does the patient have difficulty concentrating, remembering, or making decisions?: No Patient able to express need for assistance with ADLs?: Yes Does the patient have difficulty dressing or bathing?: Yes Independently performs ADLs?: No Communication: Independent, Appropriate for developmental age 60 in Home: Independent with device (comment), Appropriate for developmental age(cane) Does the patient have difficulty walking or climbing stairs?: Yes Weakness of Legs: Right Weakness of Arms/Hands: Right  Permission Sought/Granted Permission sought to share information with : Case Manager, Customer service manager, Family Supports Permission granted to share information with : Yes, Verbal Permission Granted  Share Information with NAME: [name]  Permission granted to share info w AGENCY: SNFs  Permission granted to share info w Relationship: Niece  Permission granted to share info w Contact Information: [name] [phone]  Emotional Assessment Appearance:: Appears stated age Attitude/Demeanor/Rapport: Gracious Affect (typically observed): Calm Orientation: : Oriented to Self, Oriented to Place, Oriented to  Time, Oriented to Situation Alcohol / Substance Use: Not Applicable Psych Involvement: No (comment)  Admission diagnosis:  NSTEMI (non-ST elevated myocardial infarction) [facility]) [I21.4] Patient Active Problem List   Diagnosis Date Noted  . S/P CABG x 3 11/20/2019  . Pressure injury of skin 11/18/2019  . NSTEMI (non-ST elevated myocardial infarction) ([place]) 11/17/2019  . Elevated troponin 11/03/2019  . PVD (peripheral vascular disease) ([place]) 11/03/2019  . History of  CVA with residual deficit 11/03/2019  . Elevated CK 11/03/2019  . Fall at home, initial encounter 11/03/2019  . Prediabetes 11/03/2019  . Essential hypertension  11/03/2019  . PAD (peripheral artery disease) (HCC) 05/11/2019   PCP:  Patient, No Pcp Per Pharmacy:   [facility] [phone] - [place], [place] - [address] AT [facility] [address] [place] [address] Phone: [phone] Fax: [phone]     Social Determinants of Health (SDOH) Interventions    Readmission Risk Interventions No flowsheet data found.

## 2019-11-25 NOTE — Chronic Care Management (AMB) (Signed)
Chronic Care Management   Follow Up Note   11/25/2019 Name: [name] MRN: [id number] DOB: [birth year]  Referred by: Patient, No Pcp Per Reason for referral : Chronic Care Management (CCM Case Collaboration )   [name] is a 84 y.o. year old male who is a primary care patient of Patient, No Pcp Per. The CCM team was consulted for assistance with chronic disease management and care coordination needs.    Review of patient status, including review of consultants reports, relevant laboratory and other test results, and collaboration with appropriate care team members and the patient's provider was performed as part of comprehensive patient evaluation and provision of chronic care management services.    CCM Case Collaboration regarding patient's IP event on 11/17/19 to [facility], dx: NSTEMI (non-ST elevated myocardial infarction)  Facility-Administered Encounter Medications as of 11/25/2019  Medication  . 0.45 % sodium chloride infusion  . 0.9 %  sodium chloride infusion  . 0.9 %  sodium chloride infusion  . 0.9 %  sodium chloride infusion  . acetaminophen (TYLENOL) tablet 1,000 mg   Or  . acetaminophen (TYLENOL) 160 MG/5ML solution 1,000 mg  . acetaminophen (TYLENOL) tablet 650 mg  . alum & mag hydroxide-simeth (MAALOX/MYLANTA) 200-200-20 MG/5ML suspension 30 mL  . amiodarone (PACERONE) tablet 400 mg  . aspirin EC tablet 81 mg  . bisacodyl (DULCOLAX) EC tablet 10 mg   Or  . bisacodyl (DULCOLAX) suppository 10 mg  . chlorhexidine (PERIDEX) 0.12 % solution 15 mL  . Chlorhexidine Gluconate Cloth 2 % PADS 6 each  . clopidogrel (PLAVIX) tablet 75 mg  . docusate sodium (COLACE) capsule 200 mg  . feeding supplement (ENSURE ENLIVE) (ENSURE ENLIVE) liquid 237 mL  . food thickener (THICK IT) powder  . furosemide (LASIX) injection 40 mg  . insulin aspart (novoLOG) injection 0-9 Units  . lactated ringers infusion  . lactated ringers infusion  . levalbuterol (XOPENEX)  nebulizer solution 0.63 mg  . magnesium hydroxide (MILK OF MAGNESIA) suspension 30 mL  . MEDLINE mouth rinse  . metoprolol tartrate (LOPRESSOR) injection 2.5-5 mg  . milrinone (PRIMACOR) 20 MG/100 ML (0.2 mg/mL) infusion  . morphine 2 MG/ML injection 1-4 mg  . ondansetron (ZOFRAN) injection 4 mg  . pantoprazole (PROTONIX) EC tablet 40 mg  . rosuvastatin (CRESTOR) tablet 20 mg  . sodium chloride flush (NS) 0.9 % injection 10-40 mL  . sodium chloride flush (NS) 0.9 % injection 10-40 mL  . sodium chloride flush (NS) 0.9 % injection 3 mL  . sodium chloride flush (NS) 0.9 % injection 3 mL  . sodium chloride flush (NS) 0.9 % injection 3 mL  . sodium chloride flush (NS) 0.9 % injection 3 mL  . sodium chloride flush (NS) 0.9 % injection 3 mL  . sodium chloride flush (NS) 0.9 % injection 3 mL  . traMADol (ULTRAM) tablet 50-100 mg   Outpatient Encounter Medications as of 11/25/2019  Medication Sig  . aspirin EC 81 MG tablet Take 81 mg by mouth daily.  . cetirizine (ZYRTEC) 10 MG tablet Take 10 mg by mouth as needed for allergies.  . Cholecalciferol (VITAMIN D) 50 MCG (2000 UT) tablet Take 2,000 Units by mouth daily.  . clopidogrel (PLAVIX) 75 MG tablet TAKE 1 TABLET BY MOUTH DAILY (Patient taking differently: Take 75 mg by mouth daily. )  . diclofenac sodium (VOLTAREN) 1 % GEL Apply 2 g topically 4 (four) times daily. Rub into affected area of foot 2 to 4 times daily (  Patient not taking: Reported on 11/17/2019)  . diltiazem (CARDIZEM CD) 180 MG 24 hr capsule TAKE ONE CAPSULE BY MOUTH EVERY DAY (Patient taking differently: Take 180 mg by mouth daily. )  . hydrochlorothiazide (HYDRODIURIL) 12.5 MG tablet TAKE 1 TABLET BY MOUTH ONCE DAILY (Patient not taking: No sig reported)  . lisinopril (ZESTRIL) 10 MG tablet TAKE 1 TABLET BY MOUTH DAILY (Patient taking differently: Take 10 mg by mouth daily. )  . lisinopril-hydrochlorothiazide (ZESTORETIC) 10-12.5 MG tablet Take 1 tablet by mouth daily. (Patient  not taking: Reported on 11/17/2019)  . polyethylene glycol (MIRALAX / GLYCOLAX) 17 g packet Take 17 g by mouth daily. (Patient taking differently: Take 17 g by mouth daily as needed for mild constipation. )  . rosuvastatin (CRESTOR) 10 MG tablet TAKE 1 TABLET BY MOUTH EVERY DAY (Patient taking differently: Take 10 mg by mouth daily. )  . VITAMIN D PO Take 1 tablet by mouth daily.  [name] zinc gluconate 50 MG tablet Take 50 mg by mouth daily.     Objective:  BP Readings from Last 3 Encounters:  11/25/19 123/85  11/05/19 107/65  10/09/19 112/62   Lab Results  Component Value Date   HGBA1C 6.0 (H) 06/30/2019   HGBA1C 6.0 (H) 02/17/2019   HGBA1C 5.9 (H) 05/15/2018   Lab Results  Component Value Date   LDLCALC 56 11/18/2019   CREATININE 0.94 11/25/2019    Goals Addressed      Patient Stated   . "We could use some help with medication management" (pt-stated)       CARE PLAN ENTRY (see longitudinal plan of care for additional care plan information)  Current Barriers:  [name] Knowledge Deficits related to best options for medication management of Polypharmacy . Chronic Disease Management support and education needs related to HTN, CVA, prediabetes   Nurse Case Manager Clinical Goal(s):  [name] Over the next 30 days, patient will work with embedded Pharm D to address needs related to medication management and adherence   CCM RN CM Interventions:  . Inter-disciplinary care team collaboration (see longitudinal plan of care) . Reviewed medications with patient and discussed patient had 1 medication change post d/c; Determined his HCTZ was discontinued; Determined patient is self administering his medications with the help from his niece [name], she is filling individual medication cups and the patient is able to Self administer his own medications; Determined niece is concerned about using this system and would like for a Pharmacist to help with medication management with Polypharmacy needs . Discussed  plans with patient for ongoing care management follow up and provided patient with direct contact information for care management team . Pharmacy referral for assistance with Medication management   11/25/19 CCM Case Collaboration  . Received update from embedded Pharm D Haynes Hoehn advising she received and reviewed the pharmacy referral sent, she noted the patient was admitted to [facility] on 11/17/19 with dx: Nstemi (non-ST elevated myocardial infarction) with planned transition to SNF upon discharge . Discussed the pharmacy referral will be closed at this time   Patient Self Care Activities:  . Self administers medications as prescribed (niece [name] fills medication cups) . Attends all scheduled provider appointments . Performs ADL's independently . Performs IADL's independently  Please see past updates related to this goal by clicking on the "Past Updates" button in the selected goal        Plan:   The care management team will monitor for discharge status and outreach to the patient/caregiver as  appropriate.   Barb Merino, RN, BSN, CCM Care Management Coordinator [place] [facility]  Direct Phone: [phone]

## 2019-11-25 NOTE — Progress Notes (Signed)
Modified Barium Swallow Progress Note  Patient Details  Name: [name] MRN: [id number] Date of Birth: October 17, 1935  Today's Date: 11/25/2019  Modified Barium Swallow completed.  Full report located under Chart Review in the Imaging Section.  Brief recommendations include the following:  Clinical Impression  Pt presented with mild oropharyngeal dysphagia characterized by prolonged mastication and a pharyngeal delay which resulted in penetration (PAS 3,5) of thin liquids via cup/straw and aspiration (PAS 7) of thin liquids via straw. Aspiration resulted in a slight throat clear which was ineffective in expelling the aspirant. Compensatory strategies were attempted but pt was unable to consistently follow the necessary commands for postural modifications. A dysphagia 2 diet with nectar thick liquids is recommended at this time and SLP will follow for treatment.    Swallow Evaluation Recommendations       SLP Diet Recommendations: Dysphagia 2 (Fine chop) solids;Nectar thick liquid   Liquid Administration via: Cup;Straw   Medication Administration: Whole meds with liquid   Supervision: Staff to assist with self feeding   Compensations: Slow rate;Small sips/bites   Postural Changes: Seated upright at 90 degrees   Oral Care Recommendations: Oral care BID       Orma Cheetham I. Vear Clock, MS, CCC-SLP Acute Rehabilitation Services Office number [phone] Pager [phone]  Scheryl Marten 11/25/2019,1:12 PM

## 2019-11-25 NOTE — TOC Progression Note (Addendum)
Transition of Care [facility]) - Progression Note    Patient Details  Name: [name] MRN: [id number] Date of Birth: 08-Nov-1935  [facility]) CM/SW Contact  Terrial Rhodes, LCSWA Phone Number: 11/25/2019, 11:43 AM  Clinical Narrative:     Update 4/21 1:15pm- [employer] called back wanted to know SNF choice for patient. CSW let insurance know that SNF choice is still pending. CSW will call insurance back with choice when given. [employer] let CSW know that they will give authorization for insurance when give choice of SNF placement.Also call [name] patients niece with choice options.  SNF choice pending. CSW will call insurance with choice when given. Ref# for authorization is #[id number].  CSW started insurance authorization for patient. Reference number is #[id number].   Pending bed offers. Pending insurance authorization.  CSW will continue to follow.  Expected Discharge Plan: Skilled Nursing Facility Barriers to Discharge: Continued Medical Work up  Expected Discharge Plan and Services Expected Discharge Plan: Skilled Nursing Facility       Living arrangements for the past 2 months: Single Family Home                                       Social Determinants of Health (SDOH) Interventions    Readmission Risk Interventions No flowsheet data found.

## 2019-11-26 LAB — BASIC METABOLIC PANEL
Anion gap: 9 (ref 5–15)
BUN: 15 mg/dL (ref 8–23)
CO2: 28 mmol/L (ref 22–32)
Calcium: 8.7 mg/dL — ABNORMAL LOW (ref 8.9–10.3)
Chloride: 101 mmol/L (ref 98–111)
Creatinine, Ser: 0.94 mg/dL (ref 0.61–1.24)
GFR calc Af Amer: >60 mL/min (ref >60)
GFR calc non Af Amer: >60 mL/min (ref >60)
Glucose, Bld: 117 mg/dL — ABNORMAL HIGH (ref 70–99)
Potassium: 3.2 mmol/L — ABNORMAL LOW (ref 3.5–5.1)
Sodium: 138 mmol/L (ref 135–145)

## 2019-11-26 LAB — CBC
HCT: 39.8 % (ref 39.0–52.0)
Hemoglobin: 12.9 g/dL — ABNORMAL LOW (ref 13.0–17.0)
MCH: 30 pg (ref 26.0–34.0)
MCHC: 32.4 g/dL (ref 30.0–36.0)
MCV: 92.6 fL (ref 80.0–100.0)
Platelets: 196 10*3/uL (ref 150–400)
RBC: 4.3 MIL/uL (ref 4.22–5.81)
RDW: 16.7 % — ABNORMAL HIGH (ref 11.5–15.5)
WBC: 11.3 10*3/uL — ABNORMAL HIGH (ref 4.0–10.5)
nRBC: 0 % (ref 0.0–0.2)

## 2019-11-26 LAB — GLUCOSE, CAPILLARY
Glucose-Capillary: 111 mg/dL — ABNORMAL HIGH (ref 70–99)
Glucose-Capillary: 133 mg/dL — ABNORMAL HIGH (ref 70–99)
Glucose-Capillary: 127 mg/dL — ABNORMAL HIGH (ref 70–99)
Glucose-Capillary: 93 mg/dL (ref 70–99)
Glucose-Capillary: 189 mg/dL — ABNORMAL HIGH (ref 70–99)
Glucose-Capillary: 116 mg/dL — ABNORMAL HIGH (ref 70–99)

## 2019-11-26 LAB — COOXEMETRY PANEL
Carboxyhemoglobin: 2.7 % — ABNORMAL HIGH (ref 0.5–1.5)
Methemoglobin: 1 % (ref 0.0–1.5)
O2 Saturation: 64.8 %
Total hemoglobin: 13.6 g/dL (ref 12.0–16.0)

## 2019-11-26 MED ORDER — POTASSIUM CHLORIDE 10 MEQ/50ML IV SOLN
10.0000 meq | INTRAVENOUS | Status: AC
  Administered 2019-11-26: 10 meq via INTRAVENOUS
  Filled 2019-11-26 (×2): qty 50

## 2019-11-26 MED FILL — Sodium Chloride IV Soln 0.9%: INTRAVENOUS | Qty: 3000 | Status: AC

## 2019-11-26 MED FILL — Sodium Bicarbonate IV Soln 8.4%: INTRAVENOUS | Qty: 50 | Status: AC

## 2019-11-26 MED FILL — Electrolyte-R (PH 7.4) Solution: INTRAVENOUS | Qty: 3000 | Status: AC

## 2019-11-26 MED FILL — Calcium Chloride Inj 10%: INTRAVENOUS | Qty: 10 | Status: AC

## 2019-11-26 MED FILL — Heparin Sodium (Porcine) Inj 1000 Unit/ML: INTRAMUSCULAR | Qty: 10 | Status: AC

## 2019-11-26 MED FILL — Mannitol IV Soln 20%: INTRAVENOUS | Qty: 500 | Status: AC

## 2019-11-26 NOTE — Progress Notes (Signed)
Physical Therapy Treatment Patient Details Name: [name] MRN: [id number] DOB: 04-05-1936 Today's Date: 11/26/2019    History of Present Illness Pt is 84 yo male with PMH including HTN, HLD, CVA in 2001 w R sided weakness, bil carotid disease, PAD, R BBB, and sinus bradcardia who came to ED with chest pain.  Pt admitted with NSTEMI and CHF and is now s/p CABG x3 on 11/20/18.  Pt was intubated 4/16-4/19/20.    PT Comments    Pt making good progress. Motivated to return to independence. Updated dc recommendation to CIR.    Follow Up Recommendations  CIR     Equipment Recommendations  Other (comment)(TBD by next venue)    Recommendations for Other Services       Precautions / Restrictions Precautions Precautions: Fall;Sternal Precaution Comments: HOH Required Braces or Orthoses: Other Brace Other Brace: AFO RLE    Mobility  Bed Mobility Overal bed mobility: Needs Assistance Bed Mobility: Supine to Sit;Sit to Supine     Supine to sit: Mod assist;HOB elevated Sit to supine: Mod assist   General bed mobility comments: Assist to bring RLE off of bed, elevate trunk into sitting and bring hips to EOB. Assist to bring legs back up into bed returning to supine  Transfers Overall transfer level: Needs assistance Equipment used: Straight cane;Ambulation equipment used Transfers: Sit to/from Stand Sit to Stand: Mod assist;+2 safety/equipment;+2 physical assistance         General transfer comment: Assist to bring hips up and for balance. Used bed pad for lift. Verbal cues to use lt hand on knee and not pull up on Stedy. When standing with cane pt with rt hand on cane  Ambulation/Gait Ambulation/Gait assistance: Min assist;+2 physical assistance;+2 safety/equipment Gait Distance (Feet): 1 Feet Assistive device: Straight cane Gait Pattern/deviations: Step-to pattern;Decreased step length - right;Decreased step length - left(3 point step to pattern) Gait velocity:  decr Gait velocity interpretation: <1.31 ft/sec, indicative of household ambulator General Gait Details: Assist for balance and support and assist to move rt foot since pt unable to wear rt AFO due to edema. Very small side steps up side of bed   Stairs             Wheelchair Mobility    Modified Rankin (Stroke Patients Only)       Balance Overall balance assessment: History of Falls;Needs assistance Sitting-balance support: Feet supported Sitting balance-Leahy Scale: Fair Sitting balance - Comments: min guard to min assist for dynamic   Standing balance support: During functional activity;Single extremity supported;No upper extremity supported Standing balance-Leahy Scale: Poor Standing balance comment: Stood holding stedy with min assist. Able to release stedy with min a. Weight shifting in [place]                            Cognition Arousal/Alertness: Awake/alert Behavior During Therapy: WFL for tasks assessed/performed Overall Cognitive Status: No family/caregiver present to determine baseline cognitive functioning                                 General Comments: Cognition likely baseline. Needs frequent cues for sternal precautions      Exercises      General Comments        Pertinent Vitals/Pain Pain Assessment: Faces Faces Pain Scale: Hurts a little bit Pain Location: back Pain Descriptors / Indicators: Grimacing Pain Intervention(s): Monitored during session;Repositioned  Home Living                      Prior Function            PT Goals (current goals can now be found in the care plan section) Acute Rehab PT Goals Patient Stated Goal: to get home  Progress towards PT goals: Progressing toward goals    Frequency    Min 3X/week      PT Plan Discharge plan needs to be updated;Frequency needs to be updated    Co-evaluation PT/OT/SLP Co-Evaluation/Treatment: Yes Reason for Co-Treatment: Complexity  of the patient's impairments (multi-system involvement);For patient/therapist safety PT goals addressed during session: Mobility/safety with mobility;Balance        AM-PAC PT "6 Clicks" Mobility   Outcome Measure  Help needed turning from your back to your side while in a flat bed without using bedrails?: A Lot Help needed moving from lying on your back to sitting on the side of a flat bed without using bedrails?: A Lot Help needed moving to and from a bed to a chair (including a wheelchair)?: A Lot Help needed standing up from a chair using your arms (e.g., wheelchair or bedside chair)?: A Lot Help needed to walk in hospital room?: Total Help needed climbing 3-5 steps with a railing? : Total 6 Click Score: 10    End of Session Equipment Utilized During Treatment: Gait belt Activity Tolerance: Patient tolerated treatment well Patient left: with call bell/phone within reach;in bed;with bed alarm set(feet elevated) Nurse Communication: Mobility status;Need for lift equipment(Stedy for pivot) PT Visit Diagnosis: Other abnormalities of gait and mobility (R26.89);Unsteadiness on feet (R26.81);History of falling (Z91.81);Muscle weakness (generalized) (M62.81)     Time: 6767-2094 PT Time Calculation (min) (ACUTE ONLY): 42 min  Charges:  $Therapeutic Activity: 23-37 mins                     [facility] PT Acute Rehabilitation Services Pager [phone] Office [phone]    Angelina Ok [facility] 11/26/2019, 12:59 PM

## 2019-11-26 NOTE — Progress Notes (Signed)
Pt stated he does not want to go on cpap tonight. RT will continue to monitor as needed.

## 2019-11-26 NOTE — Progress Notes (Signed)
Inpatient Rehab Admissions Coordinator Note:   Per PT/OT recommendations, pt was screened for CIR candidacy by Estill Dooms, PT, DPT.  At this time we are recommending an inpatient rehab consult.  I will contact MD for an order.  Please contact me with questions.   Estill Dooms, PT, DPT [phone] 11/26/19 1:26 PM

## 2019-11-26 NOTE — Progress Notes (Signed)
Assisted tele visit to patient with family member.  Nelson, William Lavallie Renee, RN   

## 2019-11-26 NOTE — TOC Progression Note (Signed)
Transition of Care [facility]) - Progression Note    Patient Details  Name: [name] MRN: [id number] Date of Birth: [birth year]  [facility]) CM/SW Contact  Terrial Rhodes, LCSWA Phone Number: 11/26/2019, 11:22 AM  Clinical Narrative:     CSW spoke with patients niece [name]. [name] requested for patient to go to CIR. CSW spoke with CIR and they are going to give [name] a call. CIR is going to assess patient.  CSW will continue to follow.    Expected Discharge Plan: Skilled Nursing Facility Barriers to Discharge: Continued Medical Work up  Expected Discharge Plan and Services Expected Discharge Plan: Skilled Nursing Facility       Living arrangements for the past 2 months: Single Family Home                                       Social Determinants of Health (SDOH) Interventions    Readmission Risk Interventions No flowsheet data found.

## 2019-11-26 NOTE — Progress Notes (Signed)
Pt arrived from [facility]. Pt c/a/ox4. Pt has cvp monitoring. R arm brace intact. CHG bath given. Tele notified. Vitals stable. Pt had BM. Pt oriented to room and call bell within reach.  Pt denies complaints. Will continue to monitor.  Lacy Duverney, RN

## 2019-11-26 NOTE — Evaluation (Signed)
Occupational Therapy Evaluation Patient Details Name: [name] MRN: [id number] DOB: 25-Dec-1935 Today's Date: 11/26/2019    History of Present Illness Pt is 84 yo male with PMH including HTN, HLD, CVA in 2001 w R sided weakness, bil carotid disease, PAD, R BBB, and sinus bradcardia who came to ED with chest pain.  Pt admitted with NSTEMI and CHF and is now s/p CABG x3 on 11/20/18.  Pt was intubated 4/16-4/19/20.   Clinical Impression   PTA pt living alone with intermittent assist from family. He is very HOH, therapist attempted to use clear masks to facilitate lip reading without much more success. Pt able to complete bed mobility at mod A with cues for sternal precautions. Pt AFO not currently fitting due to R foot swelling. Used stedy to help stabilize RLE for standing attempts. Pt can stand with mod A. He currently requires max A for LB dressing. Given current status, recommend CIR at d/c to help educate on sternal precautions with BADL and promote independent PLOF. Will continue to follow per POC listed below.     Follow Up Recommendations  CIR;Supervision - Intermittent    Equipment Recommendations  3 in 1 bedside commode    Recommendations for Other Services Rehab consult     Precautions / Restrictions Precautions Precautions: Fall;Sternal Precaution Booklet Issued: No Precaution Comments: HOH Required Braces or Orthoses: Other Brace Other Brace: AFO RLE      Mobility Bed Mobility Overal bed mobility: Needs Assistance Bed Mobility: Supine to Sit;Sit to Supine     Supine to sit: Mod assist;HOB elevated Sit to supine: Mod assist   General bed mobility comments: Assist to bring RLE off of bed, elevate trunk into sitting and bring hips to EOB. Assist to bring legs back up into bed returning to supine  Transfers Overall transfer level: Needs assistance Equipment used: Straight cane;Ambulation equipment used Transfers: Sit to/from Stand Sit to Stand: Mod assist;+2  safety/equipment;+2 physical assistance         General transfer comment: Assist to bring hips up and for balance. Used bed pad for lift. Verbal cues to use lt hand on knee and not pull up on Stedy. When standing with cane pt with rt hand on cane    Balance Overall balance assessment: History of Falls;Needs assistance Sitting-balance support: Feet supported Sitting balance-Leahy Scale: Fair Sitting balance - Comments: min guard to min assist for dynamic   Standing balance support: During functional activity;Single extremity supported;No upper extremity supported Standing balance-Leahy Scale: Poor Standing balance comment: Stood holding stedy with min assist. Able to release stedy with min a. Weight shifting in [place]                           ADL either performed or assessed with clinical judgement   ADL Overall ADL's : Needs assistance/impaired Eating/Feeding: Set up;Sitting   Grooming: Minimal assistance;Sitting   Upper Body Bathing: Minimal assistance;Sitting   Lower Body Bathing: Moderate assistance;Sit to/from stand;Sitting/lateral leans   Upper Body Dressing : Minimal assistance;Sitting   Lower Body Dressing: Maximal assistance;Sitting/lateral leans;Sit to/from stand Lower Body Dressing Details (indicate cue type and reason): assist to don socks in crossed leg position. Pt having difficulty maintaining trunk control. States increased difficulty post CABG and with Bil LEs being swollen Toilet Transfer: Moderate assistance;+2 for safety/equipment Toilet Transfer Details (indicate cue type and reason): side steps only, pt AFO currently not fitting due to swelling  Functional mobility during ADLs: Moderate assistance;Cane General ADL Comments: pt AFO currently not fitting due to swelling     Vision Patient Visual Report: No change from baseline       Perception     Praxis      Pertinent Vitals/Pain Pain Assessment: Faces Faces Pain Scale: Hurts  a little bit Pain Location: back Pain Descriptors / Indicators: Grimacing Pain Intervention(s): Monitored during session;Repositioned     Hand Dominance     Extremity/Trunk Assessment Upper Extremity Assessment Upper Extremity Assessment: RUE deficits/detail;LUE deficits/detail RUE Deficits / Details: Pt with flexion contractures from prior CVA LUE Deficits / Details: WFL   Lower Extremity Assessment Lower Extremity Assessment: Defer to PT evaluation       Communication Communication Communication: HOH   Cognition Arousal/Alertness: Awake/alert Behavior During Therapy: WFL for tasks assessed/performed Overall Cognitive Status: No family/caregiver present to determine baseline cognitive functioning Area of Impairment: Safety/judgement;Awareness;Problem solving                         Safety/Judgement: Decreased awareness of deficits;Decreased awareness of safety Awareness: Emergent Problem Solving: Slow processing;Decreased initiation;Requires tactile cues;Requires verbal cues General Comments: cognition likely baseline. Difficult due to [facility] status, but needing repitition and reinforcement of sternal precautions. Increased time and cues to process   General Comments       Exercises     Shoulder Instructions      Home Living Family/patient expects to be discharged to:: Private residence Living Arrangements: Alone Available Help at Discharge: Family Type of Home: House Home Access: [place]: One level     Bathroom Shower/Tub: Occupational psychologist: Kimberly - single point;Shower seat;Grab bars - toilet;Grab bars - tub/shower   Additional Comments: uses AFO      Prior Functioning/Environment Level of Independence: Needs assistance  Gait / Transfers Assistance Needed: patient modified independent using cane for mobility  for household mobility ADL's / Homemaking Assistance Needed: pt  independent with ADLs, bathing in shower and dressing independently            OT Problem List: Decreased strength;Decreased activity tolerance;Impaired balance (sitting and/or standing);Decreased coordination;Decreased safety awareness;Decreased knowledge of use of DME or AE;Decreased knowledge of precautions;Pain;Impaired tone;Impaired UE functional use;Decreased cognition      OT Treatment/Interventions: Self-care/ADL training;Therapeutic exercise;DME and/or AE instruction;Therapeutic activities;Patient/family education;Balance training;Cognitive remediation/compensation;Energy conservation    OT Goals(Current goals can be found in the care plan section) Acute Rehab OT Goals Patient Stated Goal: to get home  OT Goal Formulation: With patient/family Time For Goal Achievement: 12/10/19 Potential to Achieve Goals: Good  OT Frequency: Min 2X/week   Barriers to D/C:            Co-evaluation PT/OT/SLP Co-Evaluation/Treatment: Yes Reason for Co-Treatment: Complexity of the patient's impairments (multi-system involvement);For patient/therapist safety PT goals addressed during session: Mobility/safety with mobility;Balance OT goals addressed during session: ADL's and self-care;Strengthening/ROM      AM-PAC OT "6 Clicks" Daily Activity     Outcome Measure Help from another person eating meals?: A Little Help from another person taking care of personal grooming?: A Little Help from another person toileting, which includes using toliet, bedpan, or urinal?: A Lot Help from another person bathing (including washing, rinsing, drying)?: A Lot Help from another person to put on and taking off regular upper body clothing?: A Little Help from another person to put on and taking  off regular lower body clothing?: A Lot 6 Click Score: 15   End of Session Nurse Communication: Mobility status;Need for lift equipment;Other (comment)(to use stedy since AFO not fitting)  Activity Tolerance:  Patient tolerated treatment well Patient left: in bed;with call bell/phone within reach;with bed alarm set  OT Visit Diagnosis: Other abnormalities of gait and mobility (R26.89);Muscle weakness (generalized) (M62.81);History of falling (Z91.81);Pain;Hemiplegia and hemiparesis Hemiplegia - Right/Left: Right(from 2001) Hemiplegia - caused by: Cerebral infarction Pain - part of body: (back)                Time: 4210-3128 OT Time Calculation (min): 41 min Charges:  OT General Charges $OT Visit: 1 Visit OT Evaluation $OT Eval Moderate Complexity: 1 Mod  Dalphine Handing, MSOT, OTR/L Acute Rehabilitation Services [facility] Office Number: [phone] Pager: [phone]  Dalphine Handing 11/26/2019, 2:08 PM

## 2019-11-27 LAB — BASIC METABOLIC PANEL
Anion gap: 9 (ref 5–15)
BUN: 18 mg/dL (ref 8–23)
CO2: 30 mmol/L (ref 22–32)
Calcium: 8.6 mg/dL — ABNORMAL LOW (ref 8.9–10.3)
Chloride: 101 mmol/L (ref 98–111)
Creatinine, Ser: 0.91 mg/dL (ref 0.61–1.24)
GFR calc Af Amer: >60 mL/min (ref >60)
GFR calc non Af Amer: >60 mL/min (ref >60)
Glucose, Bld: 115 mg/dL — ABNORMAL HIGH (ref 70–99)
Potassium: 3.2 mmol/L — ABNORMAL LOW (ref 3.5–5.1)
Sodium: 140 mmol/L (ref 135–145)

## 2019-11-27 LAB — COOXEMETRY PANEL
Carboxyhemoglobin: 2.5 % — ABNORMAL HIGH (ref 0.5–1.5)
Methemoglobin: 1 % (ref 0.0–1.5)
O2 Saturation: 69.1 %
Total hemoglobin: 12.9 g/dL (ref 12.0–16.0)

## 2019-11-27 LAB — CBC
HCT: 37.1 % — ABNORMAL LOW (ref 39.0–52.0)
Hemoglobin: 12.1 g/dL — ABNORMAL LOW (ref 13.0–17.0)
MCH: 30.2 pg (ref 26.0–34.0)
MCHC: 32.6 g/dL (ref 30.0–36.0)
MCV: 92.5 fL (ref 80.0–100.0)
Platelets: 197 10*3/uL (ref 150–400)
RBC: 4.01 MIL/uL — ABNORMAL LOW (ref 4.22–5.81)
RDW: 16.5 % — ABNORMAL HIGH (ref 11.5–15.5)
WBC: 9.4 10*3/uL (ref 4.0–10.5)
nRBC: 0 % (ref 0.0–0.2)

## 2019-11-27 LAB — GLUCOSE, CAPILLARY
Glucose-Capillary: 114 mg/dL — ABNORMAL HIGH (ref 70–99)
Glucose-Capillary: 98 mg/dL (ref 70–99)
Glucose-Capillary: 134 mg/dL — ABNORMAL HIGH (ref 70–99)
Glucose-Capillary: 98 mg/dL (ref 70–99)
Glucose-Capillary: 146 mg/dL — ABNORMAL HIGH (ref 70–99)

## 2019-11-27 MED ORDER — POTASSIUM CHLORIDE CRYS ER 20 MEQ PO TBCR
40.0000 meq | EXTENDED_RELEASE_TABLET | ORAL | Status: AC
  Administered 2019-11-27: 40 meq via ORAL
  Filled 2019-11-27: qty 2

## 2019-11-27 NOTE — Progress Notes (Signed)
Epicardial pacing wires discontinued with PA @ 0940 without difficulty.  Tolerated well. Vital signs initiated q 15 mins and bed rest x 1 hour.  Chest tube sutures removed as well. Michellle

## 2019-11-27 NOTE — Progress Notes (Signed)
Ambulated 40 ft with raised walker x 1 assist.  Steady gait/denies dizziness.   2 person assist required for sit to stand positioning.

## 2019-11-27 NOTE — Progress Notes (Signed)
Physical Therapy Treatment Patient Details Name: [name] MRN: [id number] DOB: 1936-02-07 Today's Date: 11/27/2019    History of Present Illness Pt is 84 yo male with PMH including HTN, HLD, CVA in 2001 w R sided weakness, bil carotid disease, PAD, R BBB, and sinus bradcardia who came to ED with chest pain.  Pt admitted with NSTEMI and CHF and is now s/p CABG x3 on 11/20/18.  Pt was intubated 4/16-4/19/20.    PT Comments    Patient progressing slowly towards PT goals. Difficult to assess cognition as pt very HOH but follows commands using verbal and visual cues. Requires Mod A to get to EOB, but pt able to move LEs without assist today. Requires assist of 2 to stand from EOB while adhering to sternal precautions. Able to take a few pivotal steps to get to chair with Min A for balance and to progress RLE. Reviewed precautions. Will follow.   Follow Up Recommendations  CIR     Equipment Recommendations  Other (comment)(defer to next venue)    Recommendations for Other Services       Precautions / Restrictions Precautions Precautions: Fall;Sternal Precaution Booklet Issued: No Precaution Comments: very HOH Required Braces or Orthoses: Other Brace Other Brace: AFO RLE Restrictions Weight Bearing Restrictions: Yes Other Position/Activity Restrictions: sternal precautions    Mobility  Bed Mobility Overal bed mobility: Needs Assistance Bed Mobility: Supine to Sit     Supine to sit: Mod assist;HOB elevated     General bed mobility comments: Assist to elevate trunk and scoot bottom to EOB. Able to bring LEs to EOB.  Transfers Overall transfer level: Needs assistance Equipment used: 2 person hand held assist Transfers: Sit to/from Stand Sit to Stand: Mod assist;+2 physical assistance Stand pivot transfers: Min assist;+2 safety/equipment       General transfer comment: Assist to power to standing using pad to help. Pt placing hand on knee prior to transfer. Able to  take a few pivotal steps to get to chair with assist to progress RLE (AFO does not fit due to swelling) and for balance.  Ambulation/Gait                 Stairs             Wheelchair Mobility    Modified Rankin (Stroke Patients Only)       Balance Overall balance assessment: History of Falls;Needs assistance Sitting-balance support: Feet supported;No upper extremity supported Sitting balance-Leahy Scale: Fair Sitting balance - Comments: min guard to min assist for dynamic   Standing balance support: During functional activity Standing balance-Leahy Scale: Poor Standing balance comment: requires external support in standing.                            Cognition Arousal/Alertness: Awake/alert Behavior During Therapy: WFL for tasks assessed/performed Overall Cognitive Status: Difficult to assess                         Following Commands: Follows one step commands consistently;Follows one step commands with increased time Safety/Judgement: Decreased awareness of deficits;Decreased awareness of safety   Problem Solving: Slow processing;Requires verbal cues General Comments: cognition likely baseline. Difficult to assess due to [facility] status, but needing repetition and reinforcement of sternal precautions.      Exercises      General Comments General comments (skin integrity, edema, etc.): VSS on RA. Pitting edema present right foot. 2+  Pertinent Vitals/Pain Pain Assessment: Faces Faces Pain Scale: Hurts a little bit Pain Location: generalized Pain Descriptors / Indicators: Aching Pain Intervention(s): Repositioned;Monitored during session    Home Living                      Prior Function            PT Goals (current goals can now be found in the care plan section) Progress towards PT goals: Progressing toward goals(slowly)    Frequency    Min 3X/week      PT Plan Current plan remains appropriate     Co-evaluation              AM-PAC PT "6 Clicks" Mobility   Outcome Measure  Help needed turning from your back to your side while in a flat bed without using bedrails?: A Lot Help needed moving from lying on your back to sitting on the side of a flat bed without using bedrails?: A Lot Help needed moving to and from a bed to a chair (including a wheelchair)?: A Lot Help needed standing up from a chair using your arms (e.g., wheelchair or bedside chair)?: A Lot Help needed to walk in hospital room?: A Lot Help needed climbing 3-5 steps with a railing? : Total 6 Click Score: 11    End of Session Equipment Utilized During Treatment: Gait belt Activity Tolerance: Patient tolerated treatment well Patient left: in chair;with call bell/phone within reach;with chair alarm set Nurse Communication: Mobility status;Need for lift equipment(use of stedy) PT Visit Diagnosis: Other abnormalities of gait and mobility (R26.89);Unsteadiness on feet (R26.81);History of falling (Z91.81);Muscle weakness (generalized) (M62.81)     Time: 1042-1100 PT Time Calculation (min) (ACUTE ONLY): 18 min  Charges:  $Therapeutic Activity: 8-22 mins                     Vale Haven, PT, DPT Acute Rehabilitation Services Pager [phone] Office [phone]       [name] 11/27/2019, 12:55 PM

## 2019-11-27 NOTE — Progress Notes (Signed)
Applied AFO brace to RLE without difficulty.  Assisted x 2 nurses to [facility] with minimal assist.

## 2019-11-27 NOTE — Care Management Important Message (Signed)
Important Message  Patient Details  Name: [name] MRN: [id number] Date of Birth: 12-23-1935   Medicare Important Message Given:  Yes     Renie Ora 11/27/2019, 8:27 AM

## 2019-11-27 NOTE — Progress Notes (Signed)
  Speech Language Pathology Treatment: Dysphagia  Patient Details Name: [name] MRN: [id number] DOB: 08/23/1935 Today's Date: 11/27/2019 Time: 3335-4562 SLP Time Calculation (min) (ACUTE ONLY): 9 min  Assessment / Plan / Recommendation Clinical Impression  Pt was seen for skilled ST targeting diet tolerance and diagnostic treatment.  Pt was encountered asleep in the recliner chair; however he roused to moderate verbal stimulation and he was agreeable to this tx session.  RN reported that she had tried thin liquid with the pt and that he was tolerated it without difficulty.  Pt was seen with trials of thin liquid, nectar-thick liquid, and regular solids.  He was noted to be mildly impulsive, taking large serial sips of liquid and he benefited from verbal cues to slow rate of intake and to limit bolus size.  Mastication of regular solids was mildly prolonged and trace oral residue was noted on the pt's lingual surface following swallow initiation.  No overt s/sx of aspiration were observed with any trials despite challenging.  Recommend diet upgrade to Dysphagia 3 (soft) solids and thin liquids with medication administered whole in puree.  SLP will f/u to monitor diet tolerance per POC.     HPI HPI: [name] is a 84 y.o. male with PMH:  HTN, HLD, CVA in 2001 in right sided weakness, prediabetes, B/L carotid artery disease,  PAD, RBBB, sinus bradycardia who arrived to ED with chest pain. Found to have NSTEMI and  intubated 4/16-4/19 for CABG on 4/16.       SLP Plan  Continue with current plan of care       Recommendations  Diet recommendations: Dysphagia 3 (mechanical soft);Thin liquid Liquids provided via: Cup;Straw Medication Administration: Whole meds with puree Supervision: Intermittent supervision to cue for compensatory strategies;Patient able to self feed Compensations: Slow rate;Small sips/bites Postural Changes and/or Swallow Maneuvers: Seated upright 90 degrees              Oral Care Recommendations: Oral care BID SLP Visit Diagnosis: Dysphagia, unspecified (R13.10) Plan: Continue with current plan of care       GO               [name] M.S., CCC-SLP Acute Rehabilitation Services Office: [phone]  [name] [facility] 11/27/2019, 12:16 PM

## 2019-11-28 LAB — CBC
HCT: 37.3 % — ABNORMAL LOW (ref 39.0–52.0)
Hemoglobin: 12.1 g/dL — ABNORMAL LOW (ref 13.0–17.0)
MCH: 30 pg (ref 26.0–34.0)
MCHC: 32.4 g/dL (ref 30.0–36.0)
MCV: 92.3 fL (ref 80.0–100.0)
Platelets: 227 10*3/uL (ref 150–400)
RBC: 4.04 MIL/uL — ABNORMAL LOW (ref 4.22–5.81)
RDW: 16.6 % — ABNORMAL HIGH (ref 11.5–15.5)
WBC: 9.4 10*3/uL (ref 4.0–10.5)
nRBC: 0 % (ref 0.0–0.2)

## 2019-11-28 LAB — BASIC METABOLIC PANEL
Anion gap: 7 (ref 5–15)
BUN: 18 mg/dL (ref 8–23)
CO2: 30 mmol/L (ref 22–32)
Calcium: 8.4 mg/dL — ABNORMAL LOW (ref 8.9–10.3)
Chloride: 100 mmol/L (ref 98–111)
Creatinine, Ser: 0.81 mg/dL (ref 0.61–1.24)
GFR calc Af Amer: >60 mL/min (ref >60)
GFR calc non Af Amer: >60 mL/min (ref >60)
Glucose, Bld: 103 mg/dL — ABNORMAL HIGH (ref 70–99)
Potassium: 3.4 mmol/L — ABNORMAL LOW (ref 3.5–5.1)
Sodium: 137 mmol/L (ref 135–145)

## 2019-11-28 LAB — COOXEMETRY PANEL
Carboxyhemoglobin: 2.5 % — ABNORMAL HIGH (ref 0.5–1.5)
Methemoglobin: 1 % (ref 0.0–1.5)
O2 Saturation: 69.2 %
Total hemoglobin: 12.9 g/dL (ref 12.0–16.0)

## 2019-11-28 LAB — GLUCOSE, CAPILLARY
Glucose-Capillary: 114 mg/dL — ABNORMAL HIGH (ref 70–99)
Glucose-Capillary: 126 mg/dL — ABNORMAL HIGH (ref 70–99)
Glucose-Capillary: 93 mg/dL (ref 70–99)

## 2019-11-28 MED ORDER — AMIODARONE HCL 200 MG PO TABS
200.0000 mg | ORAL_TABLET | Freq: Two times a day (BID) | ORAL | Status: DC
  Administered 2019-11-28 – 2019-12-06 (×16): 200 mg via ORAL
  Filled 2019-11-28 (×16): qty 1

## 2019-11-28 MED ORDER — FUROSEMIDE 10 MG/ML IJ SOLN: 40.0000 mg | Freq: Every day | INTRAMUSCULAR | Status: DC

## 2019-11-28 MED ORDER — AMIODARONE HCL 200 MG PO TABS: 200.0000 mg | ORAL_TABLET | Freq: Two times a day (BID) | ORAL | Status: DC

## 2019-11-28 MED ORDER — POTASSIUM CHLORIDE CRYS ER 20 MEQ PO TBCR
30.0000 meq | EXTENDED_RELEASE_TABLET | Freq: Three times a day (TID) | ORAL | Status: AC
  Administered 2019-11-28 (×3): 30 meq via ORAL
  Filled 2019-11-28 (×3): qty 1

## 2019-11-28 NOTE — Progress Notes (Addendum)
      [address]       [place] [address]             [phone]        8 Days Post-Op Procedure(s) (LRB): CORONARY ARTERY BYPASS GRAFTING (CABG), ON PUMP, TIMES THREE, USING LEFT INTERNAL MAMMARY ARTERY AND ENDOSCOPICALLY HARVESTED RIGHT GREATER SAPHENOUS VEIN (N/A) TRANSESOPHAGEAL ECHOCARDIOGRAM (TEE) (N/A)  Subjective: Patient asking for ice water. He states he has no specific complaints.  Objective: Vital signs in last 24 hours: Temp:  [97.5 F (36.4 C)-98.3 F (36.8 C)] 98.3 F (36.8 C) (04/24 0432) Pulse Rate:  [68-81] 68 (04/23 1615) Cardiac Rhythm: Normal sinus rhythm (04/24 0401) Resp:  [13-22] 13 (04/24 0432) BP: (95-117)/(60-71) 105/65 (04/24 0432) SpO2:  [94 %-98 %] 96 % (04/24 0012) FiO2 (%):  [21 %] 21 % (04/23 2315) Weight:  [70.1 kg] 70.1 kg (04/24 0451)  Pre op weight 70.8 kg Current Weight  11/28/19 70.1 kg    Hemodynamic parameters for last 24 hours: CVP:  [6 mmHg-8 mmHg] 8 mmHg  Intake/Output from previous day: 04/23 0701 - 04/24 0700 In: 0  Out: 1200 [Urine:1200]   Physical Exam:  Cardiovascular: RRR Pulmonary: Clear to auscultation bilaterally Abdomen: Soft, non tender, bowel sounds present. Extremities: No  lower extremity edema. Wounds: Clean and dry.  No erythema or signs of infection.  Lab Results: CBC: Recent Labs    11/27/19 0456 11/28/19 0500  WBC 9.4 9.4  HGB 12.1* 12.1*  HCT 37.1* 37.3*  PLT 197 227   BMET:  Recent Labs    11/27/19 0456 11/28/19 0500  NA 140 137  K 3.2* 3.4*  CL 101 100  CO2 30 30  GLUCOSE 115* 103*  BUN 18 18  CREATININE 0.91 0.81  CALCIUM 8.6* 8.4*    PT/INR:  Lab Results  Component Value Date   INR 1.6 (H) 11/20/2019   INR 1.5 (H) 11/20/2019   INR 1.1 11/03/2019   ABG:  INR: Will add last result for INR, ABG once components are confirmed Will add last 4 CBG results once components are confirmed  Assessment/Plan:  1. CV - S/p NSTEMI. Previous bradycardia.  SR with HR in the 60's and SBP in the low 100's. Co ox this am 69.2 (not on Milrinone so will stop). On Amiodarone 400 mg bid, Plavix 75 mg daily. Will decrease Amiodarone to 200 mg bid. 2.  Pulmonary - On room air. 3. Volume Overload - On Lasix 40 mg IV bid will decrease to daily and change to oral in am 4.  Expected post op acute blood loss anemia - H and H this am 12.1 and 37.3 5. Deconditioned-had dizziness with ambulation last evening.  6. CBGs 146/114/93. On dysphagia 3 diet. He has no history of diabetes so will stop accu checks and SS 7. Supplement potassium 8. Will need CIR when ready for discharge  Donielle M ZimmermanPA-C 11/28/2019,7:34 AM   Chart reviewed, patient examined, agree with above.

## 2019-11-28 NOTE — Progress Notes (Signed)
Progress Note  Patient Name: [name] Date of Encounter: 11/28/2019  Primary Cardiologist: Sinclair Grooms, MD   Subjective   Resting comfortably in bed.  No complaints  Inpatient Medications    Scheduled Meds: . amiodarone  200 mg Oral BID  . aspirin EC  81 mg Oral Daily  . bisacodyl  10 mg Oral Daily   Or  . bisacodyl  10 mg Rectal Daily  . chlorhexidine  15 mL Mouth Rinse BID  . Chlorhexidine Gluconate Cloth  6 each Topical Daily  . clopidogrel  75 mg Oral Daily  . docusate sodium  200 mg Oral Daily  . feeding supplement (ENSURE ENLIVE)  237 mL Oral TID BM  . [START ON 11/29/2019] furosemide  40 mg Intravenous Daily  . levalbuterol  0.63 mg Nebulization Once  . mouth rinse  15 mL Mouth Rinse q12n4p  . pantoprazole  40 mg Oral Daily  . potassium chloride  30 mEq Oral TID  . rosuvastatin  20 mg Oral Daily  . sodium chloride flush  10-40 mL Intracatheter Q12H  . sodium chloride flush  3 mL Intravenous Once  . sodium chloride flush  3 mL Intravenous Q12H  . sodium chloride flush  3 mL Intravenous Q12H  . sodium chloride flush  3 mL Intravenous Q12H   Continuous Infusions: . sodium chloride Stopped (11/23/19 0926)  . sodium chloride    . sodium chloride    . sodium chloride 20 mL/hr at 11/24/19 1300  . lactated ringers Stopped (11/24/19 0918)  . lactated ringers Stopped (11/25/19 0630)   PRN Meds: sodium chloride, sodium chloride, acetaminophen, alum & mag hydroxide-simeth, food thickener, magnesium hydroxide, metoprolol tartrate, morphine injection, ondansetron (ZOFRAN) IV, sodium chloride flush, sodium chloride flush, sodium chloride flush, traMADol   Vital Signs    Vitals:   11/28/19 0012 11/28/19 0432 11/28/19 0451 11/28/19 0811  BP: 95/65 105/65  106/67  Pulse:    71  Resp: 16 13  17   Temp: 97.6 F (36.4 C) 98.3 F (36.8 C)  98.3 F (36.8 C)  TempSrc: Oral Oral  Axillary  SpO2: 96%   99%  Weight:   70.1 kg   Height:        Intake/Output  Summary (Last 24 hours) at 11/28/2019 0912 Last data filed at 11/28/2019 0818 Gross per 24 hour  Intake 13 ml  Output 1200 ml  Net -1187 ml   Last 3 Weights 11/28/2019 11/27/2019 11/26/2019  Weight (lbs) 154 lb 8.7 oz 152 lb 12.5 oz 151 lb 7.3 oz  Weight (kg) 70.1 kg 69.3 kg 68.7 kg      Telemetry    Sinus rhythm, no atrial fibrillation- Personally Reviewed  ECG    No new- Personally Reviewed  Physical Exam   GEN: No acute distress.   Neck: No JVD Cardiac: RRR, no murmurs, rubs, or gallops.  Chest scar noted Respiratory: Clear to auscultation bilaterally. GI: Soft, nontender, non-distended  MS: No edema; No deformity. Neuro:  Nonfocal  Psych: Normal affect   Labs    High Sensitivity Troponin:   Recent Labs  Lab 11/03/19 1649 11/17/19 1324 11/17/19 1523 11/17/19 1822 11/17/19 2110  TROPONINIHS 402* 1,867* 1,824* 1,630* 1,794*      Chemistry Recent Labs  Lab 11/21/19 2041 11/22/19 0409 11/22/19 1546 11/22/19 1709 11/26/19 0445 11/27/19 0456 11/28/19 0500  NA 140   < > 140   < > 138 140 137  K 3.9   < > 3.8   < >  3.2* 3.2* 3.4*  CL 110   < > 103   < > 101 101 100  CO2 23   < > 28   < > 28 30 30   GLUCOSE 121*   < > 108*   < > 117* 115* 103*  BUN 14   < > 11   < > 15 18 18   CREATININE 1.11   < > 1.01   < > 0.94 0.91 0.81  CALCIUM 7.6*   < > 8.6*   < > 8.7* 8.6* 8.4*  PROT 5.0*  --   --   --   --   --   --   ALBUMIN 3.2*  --  3.1*  --   --   --   --   AST 30  --   --   --   --   --   --   ALT 23  --   --   --   --   --   --   ALKPHOS 40  --   --   --   --   --   --   BILITOT 1.9*  --   --   --   --   --   --   GFRNONAA >60   < > >60   < > >60 >60 >60  GFRAA >60   < > >60   < > >60 >60 >60  ANIONGAP 7   < > 9   < > 9 9 7    < > = values in this interval not displayed.     Hematology Recent Labs  Lab 11/26/19 0445 11/27/19 0456 11/28/19 0500  WBC 11.3* 9.4 9.4  RBC 4.30 4.01* 4.04*  HGB 12.9* 12.1* 12.1*  HCT 39.8 37.1* 37.3*  MCV 92.6 92.5 92.3    MCH 30.0 30.2 30.0  MCHC 32.4 32.6 32.4  RDW 16.7* 16.5* 16.6*  PLT 196 197 227    BNPNo results for input(s): BNP, PROBNP in the last 168 hours.   DDimer No results for input(s): DDIMER in the last 168 hours.   Radiology    No results found.   Assessment & Plan    84 year old with non-ST elevation myocardial infarction with severe triple-vessel coronary artery disease status post CABG (severe left main disease)  CAD -Post CABG [place].  LIMA to LAD SVG to RCA SVG to diagonal -Progressing well. -Amiodarone prophylaxis atrial fibrillation.  Currently in sinus rhythm.    Non-ST elevation myocardial infarction -EF was reduced at the time of cardiac catheterization 35 to 40% with mid inferior inferolateral wall hypokinesis. -Continue with aggressive secondary risk factor prevention.  On high intensity statin Crestor 20.  LDL goal less than 70.  Last check on 11/18/2019-56. -With non-STEMI, on clopidogrel.  Ischemic cardiomyopathy -EF 30 to 40%.  Hopefully we will see improvement post bypass. -When able Toprol/carvedilol, ACE ARB ARNI -Diuresing.  Hypokalemia -3.4, replete      For questions or updates, please contact [facility] Please consult [email or web address] for contact info under        Signed, 11/30/19, MD  11/28/2019, 9:12 AM

## 2019-11-28 NOTE — Progress Notes (Signed)
Pt's niece, [name], is requesting to speak to the CIR admission coordinator. [name] stated that she has been reaching out and has not heard back. This RN called the admission coordinator and left a voicemail.

## 2019-11-29 LAB — BASIC METABOLIC PANEL
Anion gap: 7 (ref 5–15)
BUN: 12 mg/dL (ref 8–23)
CO2: 25 mmol/L (ref 22–32)
Calcium: 8.5 mg/dL — ABNORMAL LOW (ref 8.9–10.3)
Chloride: 105 mmol/L (ref 98–111)
Creatinine, Ser: 0.87 mg/dL (ref 0.61–1.24)
GFR calc Af Amer: >60 mL/min (ref >60)
GFR calc non Af Amer: >60 mL/min (ref >60)
Glucose, Bld: 104 mg/dL — ABNORMAL HIGH (ref 70–99)
Potassium: 4.5 mmol/L (ref 3.5–5.1)
Sodium: 137 mmol/L (ref 135–145)

## 2019-11-29 MED ORDER — FUROSEMIDE 40 MG PO TABS
40.0000 mg | ORAL_TABLET | Freq: Every day | ORAL | Status: DC
  Administered 2019-11-29 – 2019-12-06 (×8): 40 mg via ORAL
  Filled 2019-11-29 (×8): qty 1

## 2019-11-29 NOTE — Progress Notes (Signed)
Progress Note  Patient Name: [name] Date of Encounter: 11/29/2019  Primary Cardiologist: Sinclair Grooms, MD   Subjective   He wants to get out of the bed.  No chest pain, no shortness of breath.  Inpatient Medications    Scheduled Meds: . amiodarone  200 mg Oral BID  . aspirin EC  81 mg Oral Daily  . bisacodyl  10 mg Oral Daily   Or  . bisacodyl  10 mg Rectal Daily  . chlorhexidine  15 mL Mouth Rinse BID  . Chlorhexidine Gluconate Cloth  6 each Topical Daily  . clopidogrel  75 mg Oral Daily  . docusate sodium  200 mg Oral Daily  . feeding supplement (ENSURE ENLIVE)  237 mL Oral TID BM  . furosemide  40 mg Oral Daily  . levalbuterol  0.63 mg Nebulization Once  . mouth rinse  15 mL Mouth Rinse q12n4p  . pantoprazole  40 mg Oral Daily  . rosuvastatin  20 mg Oral Daily  . sodium chloride flush  10-40 mL Intracatheter Q12H  . sodium chloride flush  3 mL Intravenous Q12H   Continuous Infusions: . sodium chloride Stopped (11/23/19 0926)  . sodium chloride    . sodium chloride 20 mL/hr at 11/24/19 1300  . lactated ringers Stopped (11/24/19 0918)  . lactated ringers Stopped (11/25/19 0630)   PRN Meds: sodium chloride, acetaminophen, alum & mag hydroxide-simeth, food thickener, magnesium hydroxide, metoprolol tartrate, morphine injection, ondansetron (ZOFRAN) IV, sodium chloride flush, sodium chloride flush, traMADol   Vital Signs    Vitals:   11/28/19 2340 11/29/19 0000 11/29/19 0425 11/29/19 0819  BP: 100/61 105/60 100/64 (!) 103/55  Pulse: 66 66 66 73  Resp: 14 20 14 17   Temp: 98 F (36.7 C)  97.8 F (36.6 C) 98 F (36.7 C)  TempSrc: Oral  Oral Axillary  SpO2: 99% 99% 100% 100%  Weight:   70.8 kg   Height:        Intake/Output Summary (Last 24 hours) at 11/29/2019 0931 Last data filed at 11/29/2019 0825 Gross per 24 hour  Intake 23 ml  Output 895 ml  Net -872 ml   Last 3 Weights 11/29/2019 11/28/2019 11/27/2019  Weight (lbs) 156 lb 1.4 oz 154 lb  8.7 oz 152 lb 12.5 oz  Weight (kg) 70.8 kg 70.1 kg 69.3 kg      Telemetry    Normal sinus rhythm- Personally Reviewed   Physical Exam   GEN: No acute distress.   Neck: No JVD Cardiac: RRR, no murmurs, rubs, or gallops.  Chest wall scar noted Respiratory: Clear to auscultation bilaterally. GI: Soft, nontender, non-distended  MS: No edema; No deformity. Neuro:  Nonfocal  Psych: Normal affect   Labs    High Sensitivity Troponin:   Recent Labs  Lab 11/03/19 1649 11/17/19 1324 11/17/19 1523 11/17/19 1822 11/17/19 2110  TROPONINIHS 402* 1,867* 1,824* 1,630* 1,794*      Chemistry Recent Labs  Lab 11/22/19 1546 11/22/19 1709 11/27/19 0456 11/28/19 0500 11/29/19 0646  NA 140   < > 140 137 137  K 3.8   < > 3.2* 3.4* 4.5  CL 103   < > 101 100 105  CO2 28   < > 30 30 25   GLUCOSE 108*   < > 115* 103* 104*  BUN 11   < > 18 18 12   CREATININE 1.01   < > 0.91 0.81 0.87  CALCIUM 8.6*   < > 8.6* 8.4*  8.5*  ALBUMIN 3.1*  --   --   --   --   GFRNONAA >60   < > >60 >60 >60  GFRAA >60   < > >60 >60 >60  ANIONGAP 9   < > 9 7 7    < > = values in this interval not displayed.     Hematology Recent Labs  Lab 11/26/19 0445 11/27/19 0456 11/28/19 0500  WBC 11.3* 9.4 9.4  RBC 4.30 4.01* 4.04*  HGB 12.9* 12.1* 12.1*  HCT 39.8 37.1* 37.3*  MCV 92.6 92.5 92.3  MCH 30.0 30.2 30.0  MCHC 32.4 32.6 32.4  RDW 16.7* 16.5* 16.6*  PLT 196 197 227    BNPNo results for input(s): BNP, PROBNP in the last 168 hours.   DDimer No results for input(s): DDIMER in the last 168 hours.   Radiology    No results found.  Cardiac Studies   ECHO - EF 65%  Patient Profile     84 y.o. male with non-ST elevation myocardial infarction with severe triple-vessel coronary artery disease status post CABG (severe left main disease)  Assessment & Plan    CAD -Post CABG Dr. 97.  LIMA to LAD SVG to RCA SVG to diagonal -Progressing well. -Amiodarone prophylaxis for atrial fibrillation.   Currently in sinus rhythm.    Non-ST elevation myocardial infarction -EF was reduced at the time of cardiac catheterization 35 to 40% with mid inferior inferolateral wall hypokinesis. -Continue with aggressive secondary risk factor prevention.  On high intensity statin Crestor 20.  LDL goal less than 70.  Last check on 11/18/2019-56. -With non-STEMI, on clopidogrel.  Ischemic cardiomyopathy -EF 30 to 40%.  Hopefully we will see improvement post bypass. -When able Toprol/carvedilol, ACE ARB ARNI, BP soft -Diuresing now on p.o Lasix 40 a day.       For questions or updates, please contact [facility] Please consult [email or web address] for contact info under        Signed, 11/20/2019, MD  11/29/2019, 9:31 AM

## 2019-11-29 NOTE — Progress Notes (Addendum)
      [address]       [place] [address]             [phone]        9 Days Post-Op Procedure(s) (LRB): CORONARY ARTERY BYPASS GRAFTING (CABG), ON PUMP, TIMES THREE, USING LEFT INTERNAL MAMMARY ARTERY AND ENDOSCOPICALLY HARVESTED RIGHT GREATER SAPHENOUS VEIN (N/A) TRANSESOPHAGEAL ECHOCARDIOGRAM (TEE) (N/A)  Subjective: He states he has no specific complaints.  Objective: Vital signs in last 24 hours: Temp:  [97.5 F (36.4 C)-98.3 F (36.8 C)] 97.8 F (36.6 C) (04/25 0425) Pulse Rate:  [66-77] 66 (04/25 0425) Cardiac Rhythm: Normal sinus rhythm (04/24 1935) Resp:  [11-21] 14 (04/25 0425) BP: (99-115)/(60-80) 100/64 (04/25 0425) SpO2:  [99 %-100 %] 100 % (04/25 0425) Weight:  [70.8 kg] 70.8 kg (04/25 0425)  Pre op weight 70.8 kg Current Weight  11/29/19 70.8 kg    Hemodynamic parameters for last 24 hours: CVP:  [6 mmHg] 6 mmHg  Intake/Output from previous day: 04/24 0701 - 04/25 0700 In: 13 [I.V.:13] Out: 775 [Urine:775]   Physical Exam:  Cardiovascular: RRR Pulmonary: Clear to auscultation bilaterally Abdomen: Soft, non tender, bowel sounds present. Extremities: No  lower extremity edema. Wounds: Clean and dry.  No erythema or signs of infection.  Lab Results: CBC: Recent Labs    11/27/19 0456 11/28/19 0500  WBC 9.4 9.4  HGB 12.1* 12.1*  HCT 37.1* 37.3*  PLT 197 227   BMET:  Recent Labs    11/27/19 0456 11/28/19 0500  NA 140 137  K 3.2* 3.4*  CL 101 100  CO2 30 30  GLUCOSE 115* 103*  BUN 18 18  CREATININE 0.91 0.81  CALCIUM 8.6* 8.4*    PT/INR:  Lab Results  Component Value Date   INR 1.6 (H) 11/20/2019   INR 1.5 (H) 11/20/2019   INR 1.1 11/03/2019   ABG:  INR: Will add last result for INR, ABG once components are confirmed Will add last 4 CBG results once components are confirmed  Assessment/Plan:  1. CV - S/p NSTEMI. Previous bradycardia. SR with HR in the 60-70's and SBP in the low 100's.  On  Amiodarone 200 mg bid, Plavix 75 mg daily.  2.  Pulmonary - On room air. 3. Volume Overload - On Lasix 40 mg IV daily and has had very good diuresis over last several days. He appears to be at baseline weight so will change to oral.  4.  Expected post op acute blood loss anemia - H and H this am 12.1 and 37.3 5. Deconditioned-cont with CRPI 6. Will need CIR when ready for discharge;probably in am  Donielle M ZimmermanPA-C 11/29/2019,7:16 AM    Chart reviewed, patient examined, agree with above. Hopefully to CIR tomorrow.

## 2019-11-30 ENCOUNTER — Telehealth: Payer: Self-pay

## 2019-11-30 MED ORDER — TRAMADOL HCL 50 MG PO TABS: 50.0000 mg | ORAL_TABLET | ORAL | Status: DC | PRN

## 2019-11-30 MED ORDER — AMIODARONE HCL 200 MG PO TABS: 200.0000 mg | ORAL_TABLET | Freq: Two times a day (BID) | ORAL | Status: DC

## 2019-11-30 MED ORDER — STARCH (THICKENING) PO POWD: ORAL | 0 refills | Status: DC

## 2019-11-30 MED ORDER — FUROSEMIDE 40 MG PO TABS: 40.0000 mg | ORAL_TABLET | Freq: Every day | ORAL | Status: DC

## 2019-11-30 MED ORDER — ACETAMINOPHEN 325 MG PO TABS: 650.0000 mg | ORAL_TABLET | ORAL | Status: AC | PRN

## 2019-11-30 NOTE — Progress Notes (Signed)
Patient care plan reviewed. Patient alert and oriented x3. Disoriented to time. Patient is very hard of hearing. No acute change or signs of distress. Patient denies pain. VS stable. BP 112/60 (76). HR 71. NSR on the monitor. RR 22. SpO2 99% on RA. Will continue to monitor.  Willa Frater, RN

## 2019-11-30 NOTE — Progress Notes (Signed)
Physical Therapy Treatment Patient Details Name: [name] MRN: [id number] DOB: Dec 27, 1935 Today's Date: 11/30/2019    History of Present Illness Pt is 84 yo male with PMH including HTN, HLD, CVA in 2001 w R sided weakness, bil carotid disease, PAD, R BBB, and sinus bradcardia who came to ED with chest pain.  Pt admitted with NSTEMI and CHF and is now s/p CABG x3 on 11/20/18.  Pt was intubated 4/16-4/19/20.    PT Comments    Pt supine with HOB elevated and reports he is willing to attempting ambulation. 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 Pt educated for sternal precautions, transfers and mobility progression. Pt able to state he has had 3 fall in the last year and reports he does not ambulate without shoes or AFO because of this. Able to don shoes and AFO this session and ambulate in room with min assist. Pt able to perform HEP post walk and tolerated all well. Pt eager to continue progression.  D/c plan remains appropriate.   HR 76-87 99% on RA BP 123/59 (70) post activity    Follow Up Recommendations  CIR;Supervision for mobility/OOB     Equipment Recommendations  None recommended by PT    Recommendations for Other Services       Precautions / Restrictions Precautions Precautions: Fall;Sternal Precaution Comments: very HOH does well with reading lips Required Braces or Orthoses: Other Brace Other Brace: AFO RLE, wrist splint RUE Restrictions Weight Bearing Restrictions: Yes(sternal precautions)    Mobility  Bed Mobility Overal bed mobility: Needs Assistance Bed Mobility: Supine to Sit     Supine to sit: Min assist;HOB elevated     General bed mobility comments: HOB 30 degrees with multimodal cues pt able to bring legs to EOB, assist to elevate trunk and scoot while maintaining precautions  Transfers Overall transfer level: Needs assistance   Transfers: Sit to/from Stand Sit  to Stand: Min assist         General transfer comment: cues for hand placement not to push, assist for balance  Ambulation/Gait Ambulation/Gait assistance: Min assist Gait Distance (Feet): 40 Feet Assistive device: Straight cane Gait Pattern/deviations: Step-to pattern;Decreased stride length   Gait velocity interpretation: 1.31 - 2.62 ft/sec, indicative of limited community ambulator General Gait Details: assist for balance and safety   Stairs             Wheelchair Mobility    Modified Rankin (Stroke Patients Only)       Balance Overall balance assessment: History of Falls;Needs assistance   Sitting balance-Leahy Scale: Fair Sitting balance - Comments: minguard EOB   Standing balance support: During functional activity;Single extremity supported Standing balance-Leahy Scale: Poor Standing balance comment: requires external support in standing.                            Cognition Arousal/Alertness: Awake/alert Behavior During Therapy: WFL for tasks assessed/performed Overall Cognitive Status: Difficult to assess                                        Exercises General Exercises - Lower Extremity Long Arc Quad: AROM;Both;Seated;15 reps Hip Flexion/Marching: AROM;Both;Seated;15 reps    General Comments        Pertinent Vitals/Pain Pain Assessment: No/denies pain    Home  Living                      Prior Function            PT Goals (current goals can now be found in the care plan section) Progress towards PT goals: Progressing toward goals    Frequency    Min 3X/week      PT Plan Current plan remains appropriate    Co-evaluation              AM-PAC PT "6 Clicks" Mobility   Outcome Measure  Help needed turning from your back to your side while in a flat bed without using bedrails?: A Little Help needed moving from lying on your back to sitting on the side of a flat bed without using  bedrails?: A Little Help needed moving to and from a bed to a chair (including a wheelchair)?: A Little Help needed standing up from a chair using your arms (e.g., wheelchair or bedside chair)?: A Little Help needed to walk in hospital room?: A Little Help needed climbing 3-5 steps with a railing? : A Lot 6 Click Score: 17    End of Session Equipment Utilized During Treatment: Gait belt;Other (comment)(AFO) Activity Tolerance: Patient tolerated treatment well Patient left: in chair;with call bell/phone within reach;with chair alarm set Nurse Communication: Mobility status;Precautions PT Visit Diagnosis: Other abnormalities of gait and mobility (R26.89);Unsteadiness on feet (R26.81);History of falling (Z91.81);Muscle weakness (generalized) (M62.81)     Time: 3825-0539 PT Time Calculation (min) (ACUTE ONLY): 25 min  Charges:  $Gait Training: 8-22 mins $Therapeutic Exercise: 8-22 mins                     Emie Sommerfeld P, PT Acute Rehabilitation Services Pager: [phone] Office: [phone]    Josslynn Mentzer B Zayonna Ayuso 11/30/2019, 12:08 PM

## 2019-11-30 NOTE — Progress Notes (Signed)
Inpatient Rehabilitation Admissions Coordinator  Inpatient rehab consult received. I met with patient with his niece, [name], at bedside. We discussed goals and expectations of a possible inpt rehab . They prefer CIR rather than SNF. I will begin insurance authorization with [employer] and will follow up tomorrow. Rehab venue depends on insurance approval and bed availability. [name] would like to speak to SW of options for SNF as backup.  Barbara Boyette, RN, MSN Rehab Admissions Coordinator [phone] 11/30/2019 11:52 AM  

## 2019-11-30 NOTE — TOC Progression Note (Addendum)
Transition of Care [facility]) - Progression Note    Patient Details  Name: [name] MRN: [id number] Date of Birth: [birth year]  [facility] CM/SW Contact  Eduard Roux, [place] Phone Number: 11/30/2019, 1:26 PM  Clinical Narrative:     CSW called insurance and cancelled insurance request for SNF. Patient was screened & accepted by CIR- they will start insurance authoriation for CIR.  CSW spoke with the patient's niece,[name], discussed SNF has back up plan, provided bed offers and Medicare.gov website for reviews and star ratings.   Antony Blackbird, MSW, LCSWA Clinical Social Worker   Expected Discharge Plan: Skilled Nursing Facility Barriers to Discharge: Continued Medical Work up  Expected Discharge Plan and Services Expected Discharge Plan: Skilled Nursing Facility       Living arrangements for the past 2 months: Single Family Home                                       Social Determinants of Health (SDOH) Interventions    Readmission Risk Interventions No flowsheet data found.

## 2019-11-30 NOTE — Progress Notes (Signed)
CARDIAC REHAB PHASE I   PRE:  Rate/Rhythm: 71 SR    BP: sitting 104/65    SaO2: 99 RA  MODE:  Ambulation: 56 ft total (room and 12 ft in hall)   POST:  Rate/Rhythm: 82 SR    BP: sitting 113/72     SaO2: 90 RA  Pt asleep on arrival but willing to walk again. We donned shoes/brace, pt able to stomp right shoe on (swelling). Pt stood with min assist and gait belt. Didn't utilize rocking. Pt requested using RW so he held left side and we pushed right side for him. Able to walk with deliberate steps. Rest x3 total. Return to bed (left side). Assist x2 mod to get into bed. No major c/o, VSS. Will f/u as able. [id number]   Harriet Masson CES, ACSM 11/30/2019 2:51 PM

## 2019-11-30 NOTE — Progress Notes (Signed)
Family member made aware that staffing spoke with CIR. CIR representative to speak with family member at 11:30. Relayed the information to family member, all questions answered. Family member verbalized understanding.  Hazle Nordmann, RN

## 2019-11-30 NOTE — Progress Notes (Signed)
      [address]       [place] [address]             [phone]      10 Days Post-Op Procedure(s) (LRB): CORONARY ARTERY BYPASS GRAFTING (CABG), ON PUMP, TIMES THREE, USING LEFT INTERNAL MAMMARY ARTERY AND ENDOSCOPICALLY HARVESTED RIGHT GREATER SAPHENOUS VEIN (N/A) TRANSESOPHAGEAL ECHOCARDIOGRAM (TEE) (N/A)   Subjective:  No new complaints.  Denies pain, shortness of breath.  + BM  Objective: Vital signs in last 24 hours: Temp:  [97.7 F (36.5 C)-98 F (36.7 C)] 97.9 F (36.6 C) (04/26 0516) Pulse Rate:  [71-76] 71 (04/26 0516) Cardiac Rhythm: Normal sinus rhythm (04/26 0516) Resp:  [17-25] 20 (04/26 0516) BP: (102-133)/(55-74) 102/63 (04/26 0516) SpO2:  [98 %-100 %] 100 % (04/26 0516) Weight:  [70.1 kg] 70.1 kg (04/26 0516)  Intake/Output from previous day: 04/25 0701 - 04/26 0700 In: 380 [P.O.:347; I.V.:33] Out: 1545 [Urine:1545]  General appearance: alert, cooperative and no distress Heart: regular rate and rhythm Lungs: clear to auscultation bilaterally Abdomen: soft, non-tender; bowel sounds normal; no masses,  no organomegaly Extremities: edema trace Wound: clean and dryh  Lab Results: Recent Labs    11/28/19 0500  WBC 9.4  HGB 12.1*  HCT 37.3*  PLT 227   BMET:  Recent Labs    11/28/19 0500 11/29/19 0646  NA 137 137  K 3.4* 4.5  CL 100 105  CO2 30 25  GLUCOSE 103* 104*  BUN 18 12  CREATININE 0.81 0.87  CALCIUM 8.4* 8.5*    PT/INR: No results for input(s): LABPROT, INR in the last 72 hours. ABG    Component Value Date/Time   PHART 7.512 (H) 11/24/2019 0517   HCO3 33.6 (H) 11/24/2019 0517   TCO2 35 (H) 11/24/2019 0517   ACIDBASEDEF 1.0 11/21/2019 0502   O2SAT 69.2 11/28/2019 0500   CBG (last 3)  Recent Labs    11/28/19 0009 11/28/19 0429 11/28/19 0808  GLUCAP 114* 93 126*    Assessment/Plan: S/P Procedure(s) (LRB): CORONARY ARTERY BYPASS GRAFTING (CABG), ON PUMP, TIMES THREE, USING LEFT INTERNAL MAMMARY  ARTERY AND ENDOSCOPICALLY HARVESTED RIGHT GREATER SAPHENOUS VEIN (N/A) TRANSESOPHAGEAL ECHOCARDIOGRAM (TEE) (N/A)  1. CV- H/O NSTEMI, in NSR, BP remains too low for BB- continue Amiodarone, Plavix 2. Pulm- no acute issues, continue IS 3. Renal- creatinine has been stable, weight is below baseline, continue oral Lasix 4. Deconditioning- H/o Stroke in the past, no new deficits, remains deconditioned from surgery, awaiting CIR bed 5. Dispo- patient doing well, ready for d/c to CIR today if bed is available and insurance approves    LOS: 13 days    Lowella Dandy, PA-C 11/30/2019

## 2019-12-01 MED ORDER — MAGNESIUM HYDROXIDE 400 MG/5ML PO SUSP
30.0000 mL | Freq: Once | ORAL | Status: AC
  Administered 2019-12-01: 30 mL via ORAL
  Filled 2019-12-01: qty 30

## 2019-12-01 NOTE — Progress Notes (Signed)
Occupational Therapy Treatment Patient Details Name: [name] MRN: [id number] DOB: [birth year] Today's Date: 12/01/2019    History of present illness Pt is 84 yo male with PMH including HTN, HLD, CVA in 2001 w R sided weakness, bil carotid disease, PAD, R BBB, and sinus bradcardia who came to ED with chest pain.  Pt admitted with NSTEMI and CHF and is now s/p CABG x3 on 11/20/18.  Pt was intubated 4/16-4/19/20.   OT comments  Pt progressing well today.Pt donning R AFO and L shoe with modA overall in sitting, prior to mobility from bed to sink. Pt minA overall for stability with mobility using SPC. Pt does well reading lips and reports to be feeling better today. Pt requires modA overall for ADL due to weakness, pain and decreased activity tolerance.  LUE continues to be splinted from previous CVA. Pt read my lips well in clear mask.  Cues required  for precautions. Pt motivated to return to PLOF and would benefit from CIR. Pt would greatly benefit from continued OT skilled services for ADL, mobility and safety. OT following acutely.    Follow Up Recommendations  CIR;Supervision - Intermittent    Equipment Recommendations  3 in 1 bedside commode    Recommendations for Other Services Rehab consult    Precautions / Restrictions Precautions Precautions: Fall;Sternal Precaution Comments: very HOH does well with reading lips Required Braces or Orthoses: Other Brace Other Brace: AFO RLE, wrist splint RUE Restrictions Weight Bearing Restrictions: Yes       Mobility Bed Mobility Overal bed mobility: Needs Assistance Bed Mobility: Supine to Sit     Supine to sit: Min assist;HOB elevated     General bed mobility comments: HOB 30 degrees with multimodal cues pt able to bring legs to EOB, assist to elevate trunk and scoot while maintaining precautions  Transfers Overall transfer level: Needs assistance   Transfers: Sit to/from Stand Sit to Stand: Min assist         General  transfer comment: cues for proper hand placement to avoid pushing/pulling.    Balance Overall balance assessment: History of Falls;Needs assistance   Sitting balance-Leahy Scale: Fair Sitting balance - Comments: minguard EOB   Standing balance support: During functional activity;Single extremity supported Standing balance-Leahy Scale: Poor Standing balance comment: requires external support in standing; pt requiring cues to avoid leaning on counter                           ADL either performed or assessed with clinical judgement   ADL Overall ADL's : Needs assistance/impaired     Grooming: Minimal assistance;Standing Grooming Details (indicate cue type and reason): minA for applying toothpaste onto toohbrush. Pt able to get the cap off of paste with no assist using BUEs. Pt             Lower Body Dressing: Moderate assistance;Sitting/lateral leans;Sit to/from stand;Cueing for safety Lower Body Dressing Details (indicate cue type and reason): Pt able to fix socks by bending over. Pt donning AFO with modA and L shoe with modA overall Toilet Transfer: Minimal assistance;+2 for physical assistance Toilet Transfer Details (indicate cue type and reason): Pt able to donn AFO for steps to sink         Functional mobility during ADLs: Minimal assistance;Cane(in room) General ADL Comments: Pt donning R AFO and L shoe prior to mobility from be to sink. Pt does well reading lips and reports to be feeling better today.  pt requires modA overall for ADL due to weakness, pain and decreased activity tolerance.       Vision   Vision Assessment?: No apparent visual deficits   Perception     Praxis      Cognition Arousal/Alertness: Awake/alert Behavior During Therapy: WFL for tasks assessed/performed Overall Cognitive Status: Difficult to assess                                          Exercises     Shoulder Instructions       General Comments O2 on  RA; HR <115 BPM    Pertinent Vitals/ Pain       Pain Assessment: No/denies pain  Home Living                                          Prior Functioning/Environment              Frequency  Min 2X/week        Progress Toward Goals  OT Goals(current goals can now be found in the care plan section)  Progress towards OT goals: Progressing toward goals  Acute Rehab OT Goals Patient Stated Goal: to get home  OT Goal Formulation: With patient/family Time For Goal Achievement: 12/10/19 Potential to Achieve Goals: Good ADL Goals Pt Will Perform Grooming: with min guard assist;standing Pt Will Perform Lower Body Bathing: with min guard assist;sit to/from stand;sitting/lateral leans Pt Will Perform Upper Body Dressing: with modified independence;sitting;standing Pt Will Perform Lower Body Dressing: with min guard assist;sitting/lateral leans;sit to/from stand Pt Will Transfer to Toilet: with min guard assist;ambulating;regular height toilet;grab bars Pt Will Perform Toileting - Clothing Manipulation and hygiene: with modified independence;sit to/from stand Pt Will Perform Tub/Shower Transfer: Shower transfer;with modified independence;grab bars;shower seat;ambulating Additional ADL Goal #1: Pt will recall and apply sternal precautions to BADL with < 3 VC's  Plan Discharge plan remains appropriate    Co-evaluation                 AM-PAC OT "6 Clicks" Daily Activity     Outcome Measure   Help from another person eating meals?: A Little Help from another person taking care of personal grooming?: A Little Help from another person toileting, which includes using toliet, bedpan, or urinal?: A Lot Help from another person bathing (including washing, rinsing, drying)?: A Lot Help from another person to put on and taking off regular upper body clothing?: A Little Help from another person to put on and taking off regular lower body clothing?: A Lot 6 Click  Score: 15    End of Session Equipment Utilized During Treatment: Gait belt;Other (comment)(SPC)  OT Visit Diagnosis: Unsteadiness on feet (R26.81);Pain Hemiplegia - Right/Left: Right Hemiplegia - caused by: Cerebral infarction Pain - Right/Left: Left Pain - part of body: Arm   Activity Tolerance Patient tolerated treatment well   Patient Left with call bell/phone within reach;with chair alarm set;in chair   Nurse Communication Mobility status        Time: 2202-5427 OT Time Calculation (min): 23 min  Charges: OT General Charges $OT Visit: 1 Visit OT Treatments $Self Care/Home Management : 8-22 mins $Therapeutic Activity: 8-22 mins  Flora Lipps, OTR/L Acute Rehabilitation Services Pager: [phone] Office: [phone]    Nakita Santerre C 12/01/2019, 11:53  AM    

## 2019-12-01 NOTE — Progress Notes (Signed)
Physical Therapy Treatment Patient Details Name: [name] MRN: [id number] DOB: 1935/09/25 Today's Date: 12/01/2019    History of Present Illness Pt is 84 yo male with PMH including HTN, HLD, CVA in 2001 w R sided weakness, bil carotid disease, PAD, R BBB, and sinus bradcardia who came to ED with chest pain.  Pt admitted with NSTEMI and CHF and is now s/p CABG x3 on 11/20/18.  Pt was intubated 4/16-4/19/20.    PT Comments    Pt making good progress.  He does need min cues for sternal precautions and transfer techniques.  He was able to ambulate 69' with min A, SPC, and 1 standing rest breaks.  Does have mild instability.  Cont per POC.    Follow Up Recommendations  CIR;Supervision for mobility/OOB     Equipment Recommendations       Recommendations for Other Services       Precautions / Restrictions Precautions Precautions: Fall;Sternal Precaution Comments: very HOH (reports normally reads lips); provided written communication Other Brace: AFO RLE, wrist splint RUE Restrictions Other Position/Activity Restrictions: sternal precautions    Mobility  Bed Mobility Overal bed mobility: Needs Assistance Bed Mobility: Supine to Sit     Supine to sit: Min assist;HOB elevated     General bed mobility comments: min cues for sternal precautions; min A to scoot to EOB  Transfers Overall transfer level: Needs assistance Equipment used: Straight cane Transfers: Sit to/from Stand Sit to Stand: Min assist;From elevated surface         General transfer comment: cues for proper hand placement to avoid pushing/pulling.  Ambulation/Gait Ambulation/Gait assistance: Min guard Gait Distance (Feet): 60 Feet Assistive device: Straight cane Gait Pattern/deviations: Step-to pattern;Decreased stride length Gait velocity: decr   General Gait Details: assist for balance and safety; chair follow today; required 1 standing rest break   Stairs             Wheelchair  Mobility    Modified Rankin (Stroke Patients Only)       Balance Overall balance assessment: History of Falls;Needs assistance Sitting-balance support: Feet supported;No upper extremity supported Sitting balance-Leahy Scale: Good     Standing balance support: During functional activity;Single extremity supported Standing balance-Leahy Scale: Fair Standing balance comment: Pt able to stand at sink and brush teeth - he did lean legs on counter at times for support                            Cognition Arousal/Alertness: Awake/alert Behavior During Therapy: WFL for tasks assessed/performed Overall Cognitive Status: Within Functional Limits for tasks assessed                                 General Comments: cognition likely baseline. Difficult to assess due to [facility] status, but needing repetition and reinforcement of sternal precautions.      Exercises      General Comments General comments (skin integrity, edema, etc.): VSS on RA (HR 89 BPM max)      Pertinent Vitals/Pain Pain Assessment: Faces Faces Pain Scale: Hurts a little bit Pain Location: R elbow Pain Intervention(s): Other (comment)(Pt reports feels like sore and request to be wrapped or cushioned - applied cushioned bandage -notified RN)    Home Living                      Prior Function  PT Goals (current goals can now be found in the care plan section) Progress towards PT goals: Progressing toward goals    Frequency    Min 3X/week      PT Plan Current plan remains appropriate    Co-evaluation              AM-PAC PT "6 Clicks" Mobility   Outcome Measure  Help needed turning from your back to your side while in a flat bed without using bedrails?: A Little Help needed moving from lying on your back to sitting on the side of a flat bed without using bedrails?: A Little Help needed moving to and from a bed to a chair (including a wheelchair)?: A  Little Help needed standing up from a chair using your arms (e.g., wheelchair or bedside chair)?: A Little Help needed to walk in hospital room?: A Little Help needed climbing 3-5 steps with a railing? : A Little 6 Click Score: 18    End of Session Equipment Utilized During Treatment: Gait belt;Other (comment)(afo) Activity Tolerance: Patient tolerated treatment well Patient left: in chair;with call bell/phone within reach;with chair alarm set Nurse Communication: Mobility status;Precautions PT Visit Diagnosis: Other abnormalities of gait and mobility (R26.89);Unsteadiness on feet (R26.81);History of falling (Z91.81);Muscle weakness (generalized) (M62.81)     Time: 7017-7939 PT Time Calculation (min) (ACUTE ONLY): 27 min  Charges:  $Gait Training: 8-22 mins $Therapeutic Activity: 8-22 mins                     Maggie Font, PT Acute Rehab Services Pager [phone] [facility] [phone] [facility] [phone]    Karlton Lemon 12/01/2019, 4:59 PM

## 2019-12-01 NOTE — Progress Notes (Signed)
  Speech Language Pathology Treatment: Dysphagia  Patient Details Name: [name] MRN: [id number] DOB: 1936-04-22 Today's Date: 12/01/2019 Time: 1209-1219 SLP Time Calculation (min) (ACUTE ONLY): 10 min  Assessment / Plan / Recommendation Clinical Impression  Pt's swallowing has improved. Demonstrates functional mastication of regular solids, dual consumption of solids/liquids with no overt s/s of aspiration.  BS are clear/diminished. He is likely at baseline status with regard to swallowing.  Is able to self-feed after help with tray set-up. He complains about the food.  Recommend advancing diet to regular solids to broaden available food choices for pt.  Continue thin liquids.  Maintain meds whole in puree as a precaution.  There are no other acute SLP swallow needs - our service will sign off.   HPI HPI: [name] is a 84 y.o. male with PMH:  HTN, HLD, CVA in 2001 in right sided weakness, prediabetes, B/L carotid artery disease,  PAD, RBBB, sinus bradycardia who arrived to ED with chest pain. Found to have NSTEMI and  intubated 4/16-4/19 for CABG on 4/16.       SLP Plan  All goals met       Recommendations  Diet recommendations: Regular;Thin liquid Liquids provided via: Cup;Straw Medication Administration: Whole meds with puree Supervision: Patient able to self feed Postural Changes and/or Swallow Maneuvers: Seated upright 90 degrees                Oral Care Recommendations: Oral care BID Follow up Recommendations: None SLP Visit Diagnosis: Dysphagia, oropharyngeal phase (R13.12) Plan: All goals met       GO               Chen Saadeh L. Tivis Ringer, [place] CCC/SLP Acute Rehabilitation Services Office number [phone] Pager [phone]  Juan Quam Laurice 12/01/2019, 12:22 PM

## 2019-12-01 NOTE — Progress Notes (Signed)
CARDIAC REHAB PHASE I   PRE:  Rate/Rhythm: 68 SR    BP: sitting 108/65    SaO2: 96 RA  MODE:  Ambulation: to door   POST:  Rate/Rhythm: 82 SR    BP: sitting 108/63     SaO2: 97 RA  Pt in recliner, eager to go to bed, tired. Used gait belt and cane, mod assist x2 with gait belt to stand from low position. Used cane and ambulated to door. Fairly steady with min assist. Return to bed, we lifted legs. VSS.  [id number]    Harriet Masson CES, ACSM 12/01/2019 1:44 PM

## 2019-12-01 NOTE — Progress Notes (Signed)
      [address]       [place] [address]             [phone]      11 Days Post-Op Procedure(s) (LRB): CORONARY ARTERY BYPASS GRAFTING (CABG), ON PUMP, TIMES THREE, USING LEFT INTERNAL MAMMARY ARTERY AND ENDOSCOPICALLY HARVESTED RIGHT GREATER SAPHENOUS VEIN (N/A) TRANSESOPHAGEAL ECHOCARDIOGRAM (TEE) (N/A)   Subjective:  Patient states his belly is upset this morning.  He also has some mild back discomfort.  Also states he feels "hot"  He is confused stating he hasn't moved his bowels in 3 weeks, however last charted BM was 4/23  Objective: Vital signs in last 24 hours: Temp:  [97.4 F (36.3 C)-98.1 F (36.7 C)] 97.8 F (36.6 C) (04/27 0255) Pulse Rate:  [66-78] 75 (04/27 0255) Cardiac Rhythm: Normal sinus rhythm (04/27 0125) Resp:  [17-23] 20 (04/27 0255) BP: (101-118)/(59-71) 104/71 (04/27 0255) SpO2:  [96 %-100 %] 97 % (04/27 0255) Weight:  [66.5 kg] 66.5 kg (04/27 0255)  Intake/Output from previous day: 04/26 0701 - 04/27 0700 In: 240 [P.O.:240] Out: 1075 [Urine:1075]  General appearance: alert, cooperative and no distress Heart: regular rate and rhythm Lungs: clear to auscultation bilaterally Abdomen: soft, non-tender; bowel sounds normal; no masses,  no organomegaly Extremities: extremities normal, atraumatic, no cyanosis or edema Wound: clean and dry  Lab Results: No results for input(s): WBC, HGB, HCT, PLT in the last 72 hours. BMET:  Recent Labs    11/29/19 0646  NA 137  K 4.5  CL 105  CO2 25  GLUCOSE 104*  BUN 12  CREATININE 0.87  CALCIUM 8.5*    PT/INR: No results for input(s): LABPROT, INR in the last 72 hours. ABG    Component Value Date/Time   PHART 7.512 (H) 11/24/2019 0517   HCO3 33.6 (H) 11/24/2019 0517   TCO2 35 (H) 11/24/2019 0517   ACIDBASEDEF 1.0 11/21/2019 0502   O2SAT 69.2 11/28/2019 0500   CBG (last 3)  Recent Labs    11/28/19 0808  GLUCAP 126*    Assessment/Plan: S/P Procedure(s) (LRB): CORONARY  ARTERY BYPASS GRAFTING (CABG), ON PUMP, TIMES THREE, USING LEFT INTERNAL MAMMARY ARTERY AND ENDOSCOPICALLY HARVESTED RIGHT GREATER SAPHENOUS VEIN (N/A) TRANSESOPHAGEAL ECHOCARDIOGRAM (TEE) (N/A)  1. CV- remains hemodynamically stable- continue Amiodarone 2. Pulm- no acute issues, off oxygen 3. GI- upset stomach, hasn't had BM in 4 days, good BS.. will give MOM this morning 4. Deconditioning- patient has H/O stroke with residual defects, needs CIR placement, awaiting insurance authorization can hopefully get bed today 5. Dispo- patient stable, MOM for constipation, awaiting bed at CIR, will hopefully get authorization today   LOS: 14 days    Lowella Dandy, PA-C  12/01/2019

## 2019-12-02 ENCOUNTER — Other Ambulatory Visit: Payer: Self-pay | Admitting: Physician Assistant

## 2019-12-02 DIAGNOSIS — E43 Unspecified severe protein-calorie malnutrition: Secondary | ICD-10-CM | POA: Insufficient documentation

## 2019-12-02 DIAGNOSIS — I480 Paroxysmal atrial fibrillation: Secondary | ICD-10-CM

## 2019-12-02 MED ORDER — FUROSEMIDE 40 MG PO TABS: 40.0000 mg | ORAL_TABLET | Freq: Every day | ORAL | 0 refills | Status: DC

## 2019-12-02 MED ORDER — AMIODARONE HCL 200 MG PO TABS: 200.0000 mg | ORAL_TABLET | Freq: Two times a day (BID) | ORAL | 1 refills | Status: DC

## 2019-12-02 MED ORDER — TRAMADOL HCL 50 MG PO TABS: 50.0000 mg | ORAL_TABLET | ORAL | 0 refills | Status: DC | PRN

## 2019-12-02 MED ORDER — POTASSIUM CHLORIDE ER 20 MEQ PO TBCR: 20.0000 meq | EXTENDED_RELEASE_TABLET | Freq: Every day | ORAL | 0 refills | Status: DC

## 2019-12-02 NOTE — Progress Notes (Addendum)
CARDIOLOGY RECOMMENDATIONS:  CABG: LIMA-LAD, SVG-RCA, SVG-diagonal -   Pt placed on PPX amiodarone, reduced to 200 mg BID, per TCTS  Discharge is anticipated in the next 48 hours. Recommendations for medications and follow up:  Discharge Medications: Continue medications as they are currently listed in the [facility]. ASA, plavix, statin Exceptions to the above:  Consider starting low dose metoprolol succinate  ACEI/ARB as pressure allows  Follow Up: The patient's Primary Cardiologist is Lesleigh Noe, MD  Follow up in the office has been scheduled.  Signed,  Roe Rutherford Duke, [place]  3:27 PM 12/02/2019  [facility]   Patient seen and examined. Agree with assessment and plan.Feels well. Maintaining sinus rhythm. Consider low dose beta blocker if HR or BP increases. F/U with Dr. Katrinka Blazing post hospital.   Lennette Bihari, MD, [facility] 12/02/2019 5:54 PM

## 2019-12-02 NOTE — Progress Notes (Signed)
Inpatient Rehabilitation Admissions Coordinator  Insurance has denied CIR admit, but patient has progressed well with therapy and it is felt that he can d/c home with [facility] and 24/7 assist of nieces. I met with patient and niece, [name], at bedside and she is in agreement. I have alerted RN CM, Kristi and we will sign off at this time.  Danne Baxter, RN, MSN Rehab Admissions Coordinator [phone] 12/02/2019 11:48 AM

## 2019-12-02 NOTE — Discharge Instructions (Addendum)
Discharge Instructions:  1. You may shower, please wash incisions daily with soap and water and keep dry.  If you wish to cover wounds with dressing you may do so but please keep clean and change daily.  No tub baths or swimming until incisions have completely healed.  If your incisions become red or develop any drainage please call our office at [phone]  2. No Driving until cleared by Dr. Vickey Sages office and you are no longer using narcotic pain medications  3. Monitor your weight daily.. Please use the same scale and weigh at same time... If you gain 5-10 lbs in 48 hours with associated lower extremity swelling, please contact our office at [phone]  4. Fever of 101.5 for at least 24 hours with no source, please contact our office at [phone]  5. Activity- up as tolerated, please walk at least 3 times per day.  Avoid strenuous activity, no lifting, pushing, or pulling with your arms over 8-10 lbs for a minimum of 6 weeks  6. If any questions or concerns arise, please do not hesitate to contact our office at [phone]   Suggestions For Increasing Calories And Protein . Several small meals a day are easier to eat and digest than three large ones. Space meals about 2 to 3 hours apart to maximize comfort. . Stop eating 2 to 3 hours before bed and sleep with your head elevated if gastric reflux (GERD) and heartburn are problems. . Do not eat your favorite foods if you are feeling bad. Save them for when you feel good! . Eat breakfast-type foods at any meal. Eggs are usually easy to eat and are great any time of the day. (The same goes for pancakes and waffles.) . Eat when you feel hungry. Most people have the greatest appetite in the morning because they have not eaten all night. If this is the best meal for you, then pile on those calories and other nutrients in the morning and at lunch. Then you can have a smaller dinner without losing total calories for the day. . Eat leftovers or  nutritious snacks in the afternoon and early evening to round out your day. . Try homemade or commercially prepared nutrition bars and puddings, as well as calorie- and protein-rich liquid nutritional supplements. Benefits of Physical Activity Talk to your doctor about physical activity. Light or moderate physical activity can help maintain muscle and promote an appetite. Walking in the neighborhood or the local mall is a great way to get up, get out, and get moving. If you are unsteady on your feet, try walking around the dining room table. Save Room for [employer]! Drink most fluids between meals instead of with meals. (It is fine to have a sip to help swallow food at meal time.) Fluids (which usually have fewer calories and nutrients than solid food) can take up valuable space in your stomach.  Foods Recommended High-Protein Foods Milk products Add cheese to toast, crackers, sandwiches, baked potatoes, vegetables, soups, noodles, meat, and fruit. Use reduced-fat (2%) or whole milk in place of water when cooking cereal and cream soups. Include cream sauces on vegetables and pasta. Add powdered milk to cream soups and mashed potatoes.  Eggs Have hard-cooked eggs readily available in the refrigerator. Chop and add to salads, casseroles, soups, and vegetables. Make a quick egg salad. All eggs should be well cooked to avoid the risk of harmful bacteria.  Meats, poultry, and fish Add leftover cooked meats to soups, casseroles, salads, and  omelets. Make dip by mixing diced, chopped, or shredded meat with sour cream and spices.  Beans, legumes, nuts, and seeds Sprinkle nuts and seeds on cereals, fruit, and desserts such as ice cream, pudding, and custard. Also serve nuts and seeds on vegetables, salads, and pasta. Spread peanut butter on toast, bread, English muffins, and fruit, or blend it in a milk shake. Add beans and peas to salads, soups, casseroles, and vegetable dishes.  High-Calorie  Foods Butter, margarine, and  oils Melt butter or margarine over potatoes, rice, pasta, and cooked vegetables. Add melted butter or margarine into soups and casseroles and spread on bread for sandwiches before spreading sandwich spread or peanut butter. Saut or stir-fry vegetables, meats, chicken and fish such as shrimp/scallops in olive or canola oil. A variety of oils add calories and can be used to [employer], chicken, or fish.  Milk products Add whipping cream to desserts, pancakes, waffles, fruit, and hot chocolate, and fold it into soups and casseroles. Add sour cream to baked potatoes and vegetables.  Salad dressing Use regular (not low-fat or diet) mayonnaise and salad dressing on sandwiches and in dips with vegetables and fruit.   Sweets Add jelly and honey to bread and crackers. Add jam to fruit and ice cream and as a topping over cake.   Copyright 2020  Academy of [employer] and Dietetics. All rights reserved.  [facility] Stay Proper nutrition can help your body recover from illness and injury.   Foods and beverages high in protein, vitamins, and minerals help rebuild muscle loss, promote healing, & reduce fall risk.   .In addition to eating healthy foods, a nutrition shake is an easy, delicious way to get the nutrition you need during and after your hospital stay  It is recommended that you continue to drink 2 bottles per day of:       Ensure Plus or equivalent  for at least 1 month (30 days) after your hospital stay   Tips for adding a nutrition shake into your routine: As allowed, drink one with vitamins or medications instead of water or juice Enjoy one as a tasty mid-morning or afternoon snack Drink cold or make a milkshake out of it Drink one instead of milk with cereal or snacks Use as a coffee creamer   Available at the following grocery stores and pharmacies:           * [place] [phone]            For COUPONS visit: www.ensure.com/join or http://dawson-may.com/   Suggested Substitutions Ensure Plus = Boost Plus = Carnation Breakfast Essentials = Boost Compact Ensure Active Clear = Boost Breeze Glucerna Shake = Boost Glucose Control = Carnation Breakfast Essentials SUGAR FREE       Information about your medication: Plavix (anti-platelet agent)  Generic Name (Brand): clopidogrel (Plavix), once daily medication  PURPOSE: You are taking this medication along with aspirin to lower your chance of having a heart attack, stroke, or blood clots in your heart stent. These can be fatal. Plavix and aspirin help prevent platelets from sticking together and forming a clot that can block an artery or your stent.   Common SIDE EFFECTS  you may experience include: bruising or bleeding more easily, shortness of breath  Do not stop taking PLAVIX without talking to the doctor who prescribes it for you. People who are treated with a stent and stop taking Plavix too soon, have a higher risk of getting a blood clot in the stent, having a heart attack, or dying. If you stop Plavix because of bleeding, or for other reasons, your risk of a heart attack or stroke may increase.   Tell all of your doctors and dentists that you are taking Plavix. They should talk to the doctor who prescribed plavix for you before you have any surgery or invasive procedure.   Contact your health care provider if you experience: severe or uncontrollable bleeding, pink/red/brown urine, vomiting blood or vomit that looks like "coffee grounds", red or black stools (looks like tar), coughing up blood or blood clots ----------------------------------------------------------------------------------------------------------------------

## 2019-12-02 NOTE — Progress Notes (Signed)
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  Pt admitted with NSTEMI and CHF and is now s/p CABG x3 on 11/20/18.  Pt was intubated 4/16-4/19/20.    PT Comments    Pt making steady progress. Insurance denied CIR and pt now to go home with nieces providing 24/7 assist.    Follow Up Recommendations  Home health PT;Supervision/Assistance - 24 hour     Equipment Recommendations  None recommended by PT    Recommendations for Other Services       Precautions / Restrictions Precautions Precautions: Fall;Sternal Precaution Comments: HOH Required Braces or Orthoses: Other Brace Other Brace: AFO RLE, wrist splint RUE    Mobility  Bed Mobility Overal bed mobility: Needs Assistance Bed Mobility: Supine to Sit     Supine to sit: Min assist;HOB elevated     General bed mobility comments: Assist to elevate trunk and bring hips to EOB with sternal precautions  Transfers Overall transfer level: Needs assistance Equipment used: Straight cane Transfers: Sit to/from Stand Sit to Stand: Min assist;From elevated surface         General transfer comment: Assist to bring hips up and for balance. Verbal cues for sternal precautions  Ambulation/Gait Ambulation/Gait assistance: Min guard Gait Distance (Feet): 150 Feet Assistive device: Straight cane Gait Pattern/deviations: Decreased stride length;Step-through pattern Gait velocity: decr Gait velocity interpretation: 1.31 - 2.62 ft/sec, indicative of limited community ambulator General Gait Details: Assist for balance and safety   Stairs             Wheelchair Mobility    Modified Rankin (Stroke Patients Only)       Balance Overall balance assessment:  History of Falls;Needs assistance Sitting-balance support: Feet supported;No upper extremity supported Sitting balance-Leahy Scale: Good     Standing balance support: During functional activity;Single extremity supported Standing balance-Leahy Scale: Fair                              Cognition Arousal/Alertness: Awake/alert Behavior During Therapy: WFL for tasks assessed/performed Overall Cognitive Status: No family/caregiver present to determine baseline cognitive functioning                                 General Comments: suspect some baseline cognitive challenges      Exercises      General Comments General comments (skin integrity, edema, etc.): VSS on RA      Pertinent Vitals/Pain Pain Assessment: No/denies pain    Home Living                      Prior Function            PT Goals (current goals can now be found in the care plan section) Progress towards PT goals: Progressing toward goals    Frequency    Min 3X/week      PT Plan Discharge plan needs to be updated    Co-evaluation              AM-PAC PT "6 Clicks" Mobility   Outcome Measure  Help needed turning from your  back to your side while in a flat bed without using bedrails?: A Little Help needed moving from lying on your back to sitting on the side of a flat bed without using bedrails?: A Little Help needed moving to and from a bed to a chair (including a wheelchair)?: A Little Help needed standing up from a chair using your arms (e.g., wheelchair or bedside chair)?: A Little Help needed to walk in hospital room?: A Little Help needed climbing 3-5 steps with a railing? : A Little 6 Click Score: 18    End of Session Equipment Utilized During Treatment: Gait belt;Other (comment)(afo) Activity Tolerance: Patient tolerated treatment well Patient left: in chair;with call bell/phone within reach;with chair alarm set Nurse Communication: Mobility  status;Precautions PT Visit Diagnosis: Other abnormalities of gait and mobility (R26.89);Unsteadiness on feet (R26.81);History of falling (Z91.81);Muscle weakness (generalized) (M62.81)     Time: 1015-1040 PT Time Calculation (min) (ACUTE ONLY): 25 min  Charges:  $Gait Training: 23-37 mins                     [facility] PT Acute Rehabilitation Services Pager [phone] Office [phone]    Angelina Ok [facility] 12/02/2019, 12:31 PM

## 2019-12-02 NOTE — Progress Notes (Signed)
      [address]       [place] [address]             [phone]      12 Days Post-Op Procedure(s) (LRB): CORONARY ARTERY BYPASS GRAFTING (CABG), ON PUMP, TIMES THREE, USING LEFT INTERNAL MAMMARY ARTERY AND ENDOSCOPICALLY HARVESTED RIGHT GREATER SAPHENOUS VEIN (N/A) TRANSESOPHAGEAL ECHOCARDIOGRAM (TEE) (N/A)   Subjective:  Patient is feeling better this morning.  He as able to move his bowels yesterday.  Objective: Vital signs in last 24 hours: Temp:  [97.3 F (36.3 C)-98.6 F (37 C)] 98.3 F (36.8 C) (04/28 0752) Pulse Rate:  [63-75] 72 (04/28 0341) Cardiac Rhythm: Normal sinus rhythm (04/28 0001) Resp:  [16-25] 20 (04/28 0341) BP: (107-114)/(64-75) 107/64 (04/28 0341) SpO2:  [95 %-100 %] 98 % (04/28 0341) Weight:  [68.6 kg] 68.6 kg (04/28 0341)  Intake/Output from previous day: 04/27 0701 - 04/28 0700 In: 1325 [P.O.:1325] Out: 1725 [Urine:1725]  General appearance: alert, cooperative and no distress Heart: regular rate and rhythm Lungs: clear to auscultation bilaterally Abdomen: soft, non-tender; bowel sounds normal; no masses,  no organomegaly Extremities: extremities normal, atraumatic, no cyanosis or edema Wound: clean and dry  Lab Results: No results for input(s): WBC, HGB, HCT, PLT in the last 72 hours. BMET: No results for input(s): NA, K, CL, CO2, GLUCOSE, BUN, CREATININE, CALCIUM in the last 72 hours.  PT/INR: No results for input(s): LABPROT, INR in the last 72 hours. ABG    Component Value Date/Time   PHART 7.512 (H) 11/24/2019 0517   HCO3 33.6 (H) 11/24/2019 0517   TCO2 35 (H) 11/24/2019 0517   ACIDBASEDEF 1.0 11/21/2019 0502   O2SAT 69.2 11/28/2019 0500   CBG (last 3)  No results for input(s): GLUCAP in the last 72 hours.  Assessment/Plan: S/P Procedure(s) (LRB): CORONARY ARTERY BYPASS GRAFTING (CABG), ON PUMP, TIMES THREE, USING LEFT INTERNAL MAMMARY ARTERY AND ENDOSCOPICALLY HARVESTED RIGHT GREATER SAPHENOUS VEIN  (N/A) TRANSESOPHAGEAL ECHOCARDIOGRAM (TEE) (N/A)  1. CV- NSR, continue Amiodarone 2. Pulm- no acute issues, off oxygen 3. GI- upset stomach resolved after BM 4. Deconditioning- patient remains deconditioned with weakness.  He has residual right sided defects from his previous stroke.  He requires person assist to get up and ambulate, he is unsteady on his feet with ambulation and is a fall risk w/o help..... [employer] is requesting peer to peer review for patient to go to CIR, will plan to do this today   LOS: 15 days    Lowella Dandy, PA-C  12/02/2019

## 2019-12-02 NOTE — TOC Progression Note (Signed)
Transition of Care [facility]) - Progression Note    Patient Details  Name: [name] MRN: [id number] Date of Birth: 1936-07-28  Transition of [facility]) CM/SW Contact  Lockie Pares, RN Phone Number: 12/02/2019, 1:11 PM  Clinical Narrative:    Insurance declined CIR, Family believes   patient  able to return home.with assistance. off home health. Spoke to patient and niece [name].in the room. Niece concerned about the small amount of time that the aide there, wanted to know if any agencies have the aide do cooking and cleaning. Informed that this would be private pay and insurance does not cover this service. Gave niece and patient HH.gov compare of different agencies that [place] covers. They did see [facility] one time. Niece also requesting to speak to TCTS team. Spoke to [place], she stated she would talk to Dr. Vickey Sages. Called into the room to notify niece, no answer. Will follow up with niece.    Expected Discharge Plan: Skilled Nursing Facility Barriers to Discharge: No Barriers Identified  Expected Discharge Plan and Services Expected Discharge Plan: Skilled Nursing Facility       Living arrangements for the past 2 months: Single Family Home                                       Social Determinants of Health (SDOH) Interventions    Readmission Risk Interventions No flowsheet data found.

## 2019-12-02 NOTE — Progress Notes (Signed)
Pt just to bed. Discussed sternal precautions, IS (performed 5 good inspirations-1000 mL), and CRPII. Pt voiced some understanding but it is so hard for him to hear. Will refer to [facility] CRPII, which his niece [name] is aware of and eager for him to do. Virtual n/a at this time. [phone] Ethelda Chick CES, ACSM 2:55 PM 12/02/2019

## 2019-12-02 NOTE — Progress Notes (Signed)
Nutrition Follow-up  DOCUMENTATION CODES:   Severe malnutrition in context of chronic illness  INTERVENTION:   -Continue Ensure Enlive po TID, each supplement provides 350 kcal and 20 grams of protein -Continue MVI with minerals daily -Provided "Suggestions for Increasing Calories and Protein" handout from AND's Nutrition Care Manual  NUTRITION DIAGNOSIS:   Severe Malnutrition related to chronic illness(CVA) as evidenced by moderate fat depletion, severe fat depletion, moderate muscle depletion, severe muscle depletion.  Ongoing  GOAL:   Patient will meet greater than or equal to 90% of their needs  Progressing   MONITOR:   PO intake, Supplement acceptance, Labs, Weight trends, Skin, I & O's  REASON FOR ASSESSMENT:   Malnutrition Screening Tool    ASSESSMENT:   84 yo admitted with NSTEMI with plans for CABG. PMH includes essential HTN, HLD, CVA  4/16- s/p CABG 4/21- s/p BSE- advanced to dysphagia 2 diet with nectar thick liquids 4/22- transferred from ICU to floor 4/23- s/p BSE- advanced to dysphagia 3 diet with thin liquids 4/27- s/p BSE- advanced to regular consistency diet   Reviewed I/O's: -400 ml x 24 hours and -8.1 L since 11/18/19  UOP: 1.7 L x 24 hours  Spoke with pt and cousin [name]) in room. Most of conversation was deferred to [name], as pt is very HOH. [name] reports pt had been experiencing a general decline in health since November, accompanied by a poor appetite and weight loss. She is unsure what caused the poor appetite, but pt has been eating significantly less than usual. Pot-operatively, intake has improved and she is encouraged by his progressed. She believes that this is partly due to diet advancement, as pt has more food selections. They have been sticking to softer textured foods, but was impressed that pt consumed a hamburger yesterday.   Per [name], pt UBW is around 160-170#, which he last weighed about 6 months ago. Reviewed wt hx, which  reveals a 9.3% wt loss over the past 6 months. [name] is very concerned about this. Discussed ways to increase calories and protein in diet to help regain lost weight and preserve lean body mass, including offering small, frequent meals and options of OTC oral nutritional supplements.  Likely plan to discharge home tomorrow with home health services/   Labs reviewed: CBGS: 93-126  NUTRITION - FOCUSED PHYSICAL EXAM:    Most Recent Value  Orbital Region  Severe depletion  Upper Arm Region  Severe depletion  Thoracic and Lumbar Region  Severe depletion  Buccal Region  Moderate depletion  Temple Region  Severe depletion  Clavicle Bone Region  Severe depletion  Clavicle and Acromion Bone Region  Severe depletion  Scapular Bone Region  Severe depletion  Dorsal Hand  Severe depletion  Patellar Region  Moderate depletion  Anterior Thigh Region  Moderate depletion  Posterior Calf Region  Moderate depletion  Edema (RD Assessment)  None  Hair  Reviewed  Eyes  Reviewed  Mouth  Reviewed  Skin  Reviewed  Nails  Reviewed       Diet Order:   Diet Order            Diet Heart Room service appropriate? Yes with Assist; Fluid consistency: Thin  Diet effective now              EDUCATION NEEDS:   Education needs have been addressed  Skin:  Skin Assessment: Skin Integrity Issues: Skin Integrity Issues:: Incisions Stage I: sacrum Incisions: closed incnisions right leg, left groin, and chest  Last  BM:  12/01/19  Height:   Ht Readings from Last 1 Encounters:  11/20/19 5' 5.98" (1.676 m)    Weight:   Wt Readings from Last 1 Encounters:  12/02/19 68.6 kg    BMI:  Body mass index is 24.42 kg/m.  Estimated Nutritional Needs:   Kcal:  1800-2000  Protein:  95-105 grams  Fluid:  >/= 1.8 L/day    Loistine Chance, RD, LDN, [facility] Registered Dietitian II Certified Diabetes Care and Education Specialist Please refer to [facility] for RD and/or RD on-call/weekend/after hours pager

## 2019-12-03 LAB — RESPIRATORY PANEL BY RT PCR (FLU A&B, COVID)
Influenza A by PCR: NEGATIVE
Influenza B by PCR: NEGATIVE
SARS Coronavirus 2 by RT PCR: NEGATIVE

## 2019-12-03 MED ORDER — ROSUVASTATIN CALCIUM 20 MG PO TABS: 20.0000 mg | ORAL_TABLET | Freq: Every day | ORAL | 3 refills | Status: DC

## 2019-12-03 NOTE — Progress Notes (Signed)
      [address]       [place] [address]             [phone]      13 Days Post-Op Procedure(s) (LRB): CORONARY ARTERY BYPASS GRAFTING (CABG), ON PUMP, TIMES THREE, USING LEFT INTERNAL MAMMARY ARTERY AND ENDOSCOPICALLY HARVESTED RIGHT GREATER SAPHENOUS VEIN (N/A) TRANSESOPHAGEAL ECHOCARDIOGRAM (TEE) (N/A)   Subjective:  Patient is doing well. Happy to be going home.  Objective: Vital signs in last 24 hours: Temp:  [97.9 F (36.6 C)-98.6 F (37 C)] 98 F (36.7 C) (04/29 0400) Pulse Rate:  [63-139] 63 (04/29 0400) Cardiac Rhythm: Normal sinus rhythm (04/28 1900) Resp:  [11-20] 11 (04/29 0400) BP: (106-128)/(61-79) 114/75 (04/29 0400) SpO2:  [97 %-99 %] 99 % (04/29 0400) Weight:  [68.7 kg] 68.7 kg (04/29 0400)  Intake/Output from previous day: 04/28 0701 - 04/29 0700 In: 280 [P.O.:280] Out: 2650 [Urine:2650]  General appearance: alert, cooperative and no distress Heart: regular rate and rhythm Lungs: clear to auscultation bilaterally Abdomen: soft, non-tender; bowel sounds normal; no masses,  no organomegaly Extremities: edema none present Wound: clean and dry  Lab Results: No results for input(s): WBC, HGB, HCT, PLT in the last 72 hours. BMET: No results for input(s): NA, K, CL, CO2, GLUCOSE, BUN, CREATININE, CALCIUM in the last 72 hours.  PT/INR: No results for input(s): LABPROT, INR in the last 72 hours. ABG    Component Value Date/Time   PHART 7.512 (H) 11/24/2019 0517   HCO3 33.6 (H) 11/24/2019 0517   TCO2 35 (H) 11/24/2019 0517   ACIDBASEDEF 1.0 11/21/2019 0502   O2SAT 69.2 11/28/2019 0500   CBG (last 3)  No results for input(s): GLUCAP in the last 72 hours.  Assessment/Plan: S/P Procedure(s) (LRB): CORONARY ARTERY BYPASS GRAFTING (CABG), ON PUMP, TIMES THREE, USING LEFT INTERNAL MAMMARY ARTERY AND ENDOSCOPICALLY HARVESTED RIGHT GREATER SAPHENOUS VEIN (N/A) TRANSESOPHAGEAL ECHOCARDIOGRAM (TEE) (N/A)  1. CV- Sinus Bradycardia, BP  remains labile- patient continue Amiodarone 2. Pulm- no acute issues,continue IS 3. Renal- weight is below baseline,  4. Deconditioning- patients insurance declined CIR, patient has decided to go home with home health, has great family support 5. Dispo- patient stable, will d/c home today   LOS: 16 days    Lowella Dandy, PA-C 12/03/2019

## 2019-12-03 NOTE — Progress Notes (Signed)
Physical Therapy Treatment Patient Details Name: [name] MRN: [id number] DOB: [birth year] Today's Date: 12/03/2019    History of Present Illness Pt is 84 yo male with PMH including HTN, HLD, CVA in 2001 w R sided weakness, bil carotid disease, PAD, R BBB, and sinus bradcardia who came to ED with chest pain.  Pt admitted with NSTEMI and CHF and is now s/p CABG x3 on 11/20/18.  Pt was intubated 4/16-4/19/20.    PT Comments    Pt continues to make steady progress however family is unable to provide physical assist/supervision 24/7. Therefore updated dc recommendation to ST-SNF for further rehab before pt can go home.    Follow Up Recommendations  Supervision/Assistance - 24 hour;SNF     Equipment Recommendations  None recommended by PT    Recommendations for Other Services       Precautions / Restrictions Precautions Precautions: Fall;Sternal Precaution Comments: HOH Required Braces or Orthoses: Other Brace Other Brace: AFO RLE, wrist splint RUE Restrictions Weight Bearing Restrictions: Yes Other Position/Activity Restrictions: sternal precautions    Mobility  Bed Mobility Overal bed mobility: Needs Assistance Bed Mobility: Supine to Sit;Sit to Supine     Supine to sit: Min assist;HOB elevated     General bed mobility comments: Assist to elevate trunk and bring hips to EOB with sternal precautions. Assist to bring rt leg back up into bed  Transfers Overall transfer level: Needs assistance Equipment used: Straight cane Transfers: Sit to/from Stand Sit to Stand: Min assist;From elevated surface         General transfer comment: Assist to bring hips up and for balance. Verbal cues for sternal precautions  Ambulation/Gait Ambulation/Gait assistance: Min guard Gait Distance (Feet): 160 Feet Assistive device: Straight cane Gait Pattern/deviations: Decreased stride length;Step-through pattern;Decreased dorsiflexion - right Gait velocity: decr Gait velocity  interpretation: 1.31 - 2.62 ft/sec, indicative of limited community ambulator General Gait Details: Assist for balance and safety   Stairs             Wheelchair Mobility    Modified Rankin (Stroke Patients Only)       Balance Overall balance assessment: History of Falls;Needs assistance Sitting-balance support: Feet supported;No upper extremity supported Sitting balance-Leahy Scale: Good     Standing balance support: During functional activity;Single extremity supported Standing balance-Leahy Scale: Fair                              Cognition Arousal/Alertness: Awake/alert Behavior During Therapy: WFL for tasks assessed/performed Overall Cognitive Status: No family/caregiver present to determine baseline cognitive functioning                                 General Comments: suspect some baseline cognitive challenges      Exercises      General Comments General comments (skin integrity, edema, etc.): VSS on RA      Pertinent Vitals/Pain Pain Assessment: No/denies pain    Home Living                      Prior Function            PT Goals (current goals can now be found in the care plan section) Acute Rehab PT Goals Patient Stated Goal: to get home  Progress towards PT goals: Progressing toward goals    Frequency    Min 2X/week  PT Plan Discharge plan needs to be updated;Frequency needs to be updated    Co-evaluation              AM-PAC PT "6 Clicks" Mobility   Outcome Measure  Help needed turning from your back to your side while in a flat bed without using bedrails?: A Little Help needed moving from lying on your back to sitting on the side of a flat bed without using bedrails?: A Little Help needed moving to and from a bed to a chair (including a wheelchair)?: A Little Help needed standing up from a chair using your arms (e.g., wheelchair or bedside chair)?: A Little Help needed to walk in  hospital room?: A Little Help needed climbing 3-5 steps with a railing? : A Little 6 Click Score: 18    End of Session Equipment Utilized During Treatment: Gait belt;Other (comment)(afo) Activity Tolerance: Patient tolerated treatment well Patient left: with call bell/phone within reach;in bed;with bed alarm set   PT Visit Diagnosis: Other abnormalities of gait and mobility (R26.89);Unsteadiness on feet (R26.81);History of falling (Z91.81);Muscle weakness (generalized) (M62.81)     Time: 4970-2637 PT Time Calculation (min) (ACUTE ONLY): 20 min  Charges:  $Gait Training: 8-22 mins                     [place] Pager [phone] Office [place] 12/03/2019, 2:30 PM

## 2019-12-03 NOTE — Telephone Encounter (Signed)
Niece Duncan Dull back to this Case manager, now has decided to pick [facility]  For SNF and [facility] second. Reached out to [facility] and left a message for admissions. COVID retest pending.

## 2019-12-03 NOTE — Telephone Encounter (Signed)
Left message for [name] to call  This case manager back. NO decision on  [facility] agency yet. Patient is to  be discharged today.

## 2019-12-03 NOTE — Progress Notes (Addendum)
Occupational Therapy Treatment Patient Details Name: [name] MRN: [id number] DOB: [birth year] Today's Date: 12/03/2019    History of present illness Pt is 84 yo male with PMH including HTN, HLD, CVA in 2001 w R sided weakness, bil carotid disease, PAD, R BBB, and sinus bradcardia who came to ED with chest pain.  Pt admitted with NSTEMI and CHF and is now s/p CABG x3 on 11/20/18.  Pt was intubated 4/16-4/19/20.   OT comments  Pt making consistent progress with functional goals. Pt niece [name] [facility] RN) present and session focused on sternal precautions with bed mobility, ADLs and sit - stand/transfers using SPC. Pt's niece expressed concern about py being able to get OOB/chairs without breaking sternal precautions  And assist using BSC at night. She is also concerned about being able to provide 24/7 sup and assist amongst herself and other nieces and  questioned the meaning of supervision(i.e can she be next door using baby monitor). Spoke with pt's niece [name] extensively about pt's current level of care with assist and sup required for safety. [name] says she may ow have to consider ST SNF and states that she will speak to CM or SW about this. OT will continue to follow acutely  Follow Up Recommendations  Home health OT(CIR declined. Pt's niece [name] wants to take him home with [facility] but concerned about amount of assist and supervision that family can provide and wants to speak with CM or SW about ST SNF)    Equipment Recommendations  3 in 1 bedside commode;Other (comment)(reacher)    Recommendations for Other Services      Precautions / Restrictions Precautions Precautions: Fall;Sternal Precaution Comments: HOH Required Braces or Orthoses: Other Brace Other Brace: AFO RLE, wrist splint RUE Restrictions Weight Bearing Restrictions: Yes Other Position/Activity Restrictions: sternal precautions       Mobility Bed Mobility                  Transfers                       Balance                                           ADL either performed or assessed with clinical judgement   ADL Overall ADL's : Needs assistance/impaired     Grooming: Wash/dry hands;Wash/dry face;Standing   Upper Body Bathing: Sitting;Min guard Upper Body Bathing Details (indicate cue type and reason): simulated Lower Body Bathing: Moderate assistance;Sit to/from stand;Sitting/lateral leans Lower Body Bathing Details (indicate cue type and reason): simulated Upper Body Dressing : Min guard;Sitting   Lower Body Dressing: Moderate assistance;Sitting/lateral leans;Sit to/from stand;Cueing for safety Lower Body Dressing Details (indicate cue type and reason): pt required mod A to don shoes, mostly with R shoe with AFO Toilet Transfer: Minimal assistance Toilet Transfer Details (indicate cue type and reason): simulated to recliner using cane Toileting- Clothing Manipulation and Hygiene: Moderate assistance;Sit to/from stand       Functional mobility during ADLs: Minimal assistance;Cane       Vision       Perception     Praxis      Cognition  Exercises     Shoulder Instructions       General Comments      Pertinent Vitals/ Pain          Home Living                                          Prior Functioning/Environment              Frequency  Min 2X/week        Progress Toward Goals  OT Goals(current goals can now be found in the care plan section)  Progress towards OT goals: Progressing toward goals  Acute Rehab OT Goals Patient Stated Goal: to get home   Plan Discharge plan needs to be updated    Co-evaluation                 AM-PAC OT "6 Clicks" Daily Activity     Outcome Measure   Help from another person eating meals?: A Little Help from another person taking care of personal grooming?: A Little Help from another person  toileting, which includes using toliet, bedpan, or urinal?: A Lot Help from another person bathing (including washing, rinsing, drying)?: A Lot Help from another person to put on and taking off regular upper body clothing?: A Little Help from another person to put on and taking off regular lower body clothing?: A Lot 6 Click Score: 15    End of Session Equipment Utilized During Treatment: Gait belt;Other (comment)(SPC, R AFO)  OT Visit Diagnosis: Unsteadiness on feet (R26.81);Pain Hemiplegia - Right/Left: Right Hemiplegia - caused by: Cerebral infarction Pain - Right/Left: Left Pain - part of body: Arm   Activity Tolerance     Patient Left with call bell/phone within reach;with chair alarm set;in chair;with family/visitor present   Nurse Communication          Time: 2956-2130 OT Time Calculation (min): 39 min  Charges: OT General Charges $OT Visit: 1 Visit OT Treatments $Self Care/Home Management : 8-22 mins $Therapeutic Activity: 23-37 mins     Galen Manila 12/03/2019, 11:47 AM

## 2019-12-03 NOTE — Progress Notes (Signed)
Patient chose to go to Nursing Home. Nieces agree. They chose [facility] care. , But they wanted to add [facility] to the list of Nursing homes. Added [facility] care.  And left a message with Lynden Ang to return call. Called [employer] ( [employer] ) Insurance to start Authorization. Reference number [id number]. Fax information to [phone]

## 2019-12-03 NOTE — Progress Notes (Addendum)
Nurse Leotis Shames came to Let me know that [name] is in the room and wishes to speak to Case Management.  [name] states she is concerned about patient coming home. She does not know if he can get out of the bed with his sternal precautions. Explained to her about sternal precautions [name] and patient are requesting a Hospital bed for home [name] is also now requesting a list of  SNF in the area.[facility] from [facility] to give a quote  on a hospital bed.for patient.  Has still not picked [facility] agency. Also has concern about 24/7. Asking what exactly that means, and for how long that would entail.

## 2019-12-03 NOTE — Discharge Summary (Signed)
Addendum to previously discharge summary:   The patient's insurance declined his CIR stay.  They have decided to take the patient home with PT/OT and home health.  This has been arranged and he is medically stable for discharge home today.   BP 114/75 (BP Location: Left Arm)   Pulse 63   Temp 98 F (36.7 C) (Oral)   Resp 11   Ht 5' 5.98" (1.676 m)   Wt 68.7 kg   SpO2 99%   BMI 24.46 kg/m   General appearance: alert, cooperative and no distress Heart: regular rate and rhythm Lungs: clear to auscultation bilaterally Abdomen: soft, non-tender; bowel sounds normal; no masses,  no organomegaly Extremities: edema none present Wound: clean and dry   Lowella Dandy, PA-C 12/03/2019

## 2019-12-04 ENCOUNTER — Other Ambulatory Visit: Payer: Self-pay | Admitting: Cardiothoracic Surgery

## 2019-12-04 ENCOUNTER — Telehealth: Payer: Self-pay

## 2019-12-04 DIAGNOSIS — Z951 Presence of aortocoronary bypass graft: Secondary | ICD-10-CM

## 2019-12-04 NOTE — Care Management Important Message (Signed)
Important Message  Patient Details  Name: [name] MRN: [id number] Date of Birth: 1936/02/11   Medicare Important Message Given:  Yes     Renie Ora 12/04/2019, 12:39 PM

## 2019-12-04 NOTE — Progress Notes (Signed)
CARDIAC REHAB PHASE I   PRE:  Rate/Rhythm: 78 SR    BP: sitting 99/72    SaO2:   MODE:  Ambulation: 140 ft   POST:  Rate/Rhythm: 98 SR    BP: sitting 113/69     SaO2: 94 RA  Pt able to stand with rocking and walk with cane. Min gait belt assist. Rest x1 for fatigue. To bed, able to lift legs independently. VSS.  [id number]  Harriet Masson CES, ACSM 12/04/2019 3:23 PM

## 2019-12-04 NOTE — Progress Notes (Addendum)
Insurance called for authorization to [facility] # [phone]. # [id number] for billing  authorization is good through may 5th, 2021 Sherrlyn Hock is contact. Call [phone] for updates. Called niece [name] with the infomration. She stated that she wanted to check with [facility] This Case Manager stated that we cannot wait for any other beds to become available or further authorizations as the patient has been discharged for 2 days. I did tell her I would check on [facility]. [facility] is still pending. [facility] does not have a bed until Sunday.

## 2019-12-04 NOTE — Discharge Summary (Addendum)
Addendum to previous discharge summary from 4/29.   The patient's family has decided not take him home and have requested SNF placement.  Social work has been consulted and they have arranged SNF placement.  He was medically stable and discharged to SNF on 5/2.   Erin Barrett, PA-C Jari Favre, PA-C

## 2019-12-04 NOTE — Progress Notes (Signed)
Called [facility] Spoke to [place] in admissions. She will call back, they are in process of construction, moving patients. Asked if patient has COVID  vaccinations yet. Patient has not had vaccinations yet per [name], niece.. They are not required per Toniann Fail, however she stated they are clustering people who have not had the vaccines differently. She stated she will call back at 0930

## 2019-12-04 NOTE — Progress Notes (Signed)
Updated [name] on bed status we are awaiting call back from [facility]. [place] called back  To start authorization. Will be calling [employer] to check  On authorization

## 2019-12-04 NOTE — Progress Notes (Signed)
[facility] does not have a bed at this time [facility] available on Sunday. [facility]ing for [facility] to call back on bed availability.

## 2019-12-04 NOTE — Progress Notes (Signed)
      [address]       [place] [address]             [phone]        14 Days Post-Op Procedure(s) (LRB): CORONARY ARTERY BYPASS GRAFTING (CABG), ON PUMP, TIMES THREE, USING LEFT INTERNAL MAMMARY ARTERY AND ENDOSCOPICALLY HARVESTED RIGHT GREATER SAPHENOUS VEIN (N/A) TRANSESOPHAGEAL ECHOCARDIOGRAM (TEE) (N/A)  Subjective: Patient states "it is too hot in my room and I want to get out of the hospital". Room temperature turned down to patient's request.  Objective: Vital signs in last 24 hours: Temp:  [97.7 F (36.5 C)-97.9 F (36.6 C)] 97.7 F (36.5 C) (04/30 0811) Pulse Rate:  [69-74] 71 (04/30 0811) Cardiac Rhythm: Normal sinus rhythm (04/30 0700) Resp:  [20-23] 22 (04/30 0811) BP: (107-121)/(63-78) 121/73 (04/30 0811) SpO2:  [95 %-100 %] 96 % (04/30 0811) Weight:  [67.6 kg] 67.6 kg (04/30 0525)  Pre op weight 70.8 kg Current Weight  12/04/19 67.6 kg      Intake/Output from previous day: 04/29 0701 - 04/30 0700 In: 240 [P.O.:240] Out: 700 [Urine:700]   Physical Exam:  Cardiovascular: RRR Pulmonary: Clear to auscultation bilaterally Abdomen: Soft, non tender, bowel sounds present. Extremities: No  lower extremity edema. Wounds: Clean and dry.  No erythema or signs of infection.  Lab Results: CBC: No results for input(s): WBC, HGB, HCT, PLT in the last 72 hours. BMET:  No results for input(s): NA, K, CL, CO2, GLUCOSE, BUN, CREATININE, CALCIUM in the last 72 hours.  PT/INR:  Lab Results  Component Value Date   INR 1.6 (H) 11/20/2019   INR 1.5 (H) 11/20/2019   INR 1.1 11/03/2019   ABG:  INR: Will add last result for INR, ABG once components are confirmed Will add last 4 CBG results once components are confirmed  Assessment/Plan:  1. CV - S/p NSTEMI. Previous bradycardia. SR with HR in the 60-70's and SBP in the low 100's.  On Amiodarone 200 mg bid, Plavix 75 mg daily.  2.  Pulmonary - On room air. 3. Volume Overload - On Lasix  40 mg po daily . 4.  Expected post op acute blood loss anemia - H and H this am 12.1 and 37.3 5. Deconditioned-cont with CRPI 6. Family changed their mind about taking him home so he will need SNF; when bed available, stable for discharge.  Wilmar Prabhakar M ZimmermanPA-C 12/04/2019,10:02 AM

## 2019-12-04 NOTE — Progress Notes (Signed)
      [address]       [place] [address]             [phone]       Niece is asking about COVID 19 vaccine and if the patient can get it when he goes to the nursing home-she was worried about the hypercoagulability.  Dr. Vickey Sages said it was okay to get asap. Also asking about a follow-up Echocardiogram. I explained we usually schedule this during the follow-up appointment. His follow-up is Monday May 3rd. All other questions answered to the patient's and family member's satisfaction.    Jari Favre, PA-C

## 2019-12-05 LAB — SARS CORONAVIRUS 2 (TAT 6-24 HRS): SARS Coronavirus 2: NEGATIVE

## 2019-12-05 NOTE — TOC Progression Note (Signed)
Transition of Care [facility]) - Progression Note    Patient Details  Name: CHAISE MAHABIR MRN: [id number] Date of Birth: [birth year]  Transition of Care [facility]) CM/SW Contact  Eduard Roux, [place] Phone Number: 12/05/2019, 10:38 AM  Clinical Narrative:     Spoke with patient's niece, [name], she confirmed SNF choice is [facility].  CSW will call insurance to update correct SNF choice -reference # [id number] from 04/30-05/05.  CSW  Informed RN - covid test needed   CSW will continue to follow and assist with discharge planning.  Antony Blackbird, MSW, LCSWA Clinical Social Worker   Expected Discharge Plan: Skilled Nursing Facility Barriers to Discharge: No Barriers Identified  Expected Discharge Plan and Services Expected Discharge Plan: Skilled Nursing Facility       Living arrangements for the past 2 months: Single Family Home Expected Discharge Date: 12/03/19               DME Arranged: Hospital bed(Qoute for hosptial bed) DME Agency: [facility] Date DME Agency Contacted: 12/03/19 Time DME Agency Contacted: 1109               Social Determinants of Health (SDOH) Interventions    Readmission Risk Interventions No flowsheet data found.

## 2019-12-05 NOTE — Progress Notes (Signed)
Physical Therapy Note  Noted new order for imminent discharge. Noted earlier today, patient's niece was questioning discharge plan of home vs SNF. Later Social Work note indicates she has agreed to [facility]. Called and spoke with patient's nurse and current plan is to go to [facility]/SNF on Sunday. RN requested PT to see patient in case recommendation changes. Notified RN PT will attempt to see.    Jerolyn Center, PT Pager [phone]

## 2019-12-05 NOTE — Progress Notes (Addendum)
      [address]       [place] [address]             [phone]      15 Days Post-Op Procedure(s) (LRB): CORONARY ARTERY BYPASS GRAFTING (CABG), ON PUMP, TIMES THREE, USING LEFT INTERNAL MAMMARY ARTERY AND ENDOSCOPICALLY HARVESTED RIGHT GREATER SAPHENOUS VEIN (N/A) TRANSESOPHAGEAL ECHOCARDIOGRAM (TEE) (N/A) Subjective: Wants to get out of the hospital.   Objective: Vital signs in last 24 hours: Temp:  [97.6 F (36.4 C)-98 F (36.7 C)] 97.6 F (36.4 C) (05/01 0313) Pulse Rate:  [63-110] 66 (05/01 0313) Cardiac Rhythm: Normal sinus rhythm (04/30 1900) Resp:  [16-26] 16 (05/01 0313) BP: (102-121)/(65-73) 114/65 (05/01 0313) SpO2:  [96 %-100 %] 99 % (05/01 0313) Weight:  [69.9 kg] 69.9 kg (05/01 0313)    Intake/Output from previous day: 04/30 0701 - 05/01 0700 In: 490 [P.O.:480; I.V.:10] Out: 2275 [Urine:2275] Intake/Output this shift: No intake/output data recorded.  General appearance: alert, cooperative and no distress Heart: regular rate and rhythm, S1, S2 normal, no murmur, click, rub or gallop Lungs: clear to auscultation bilaterally Abdomen: soft, non-tender; bowel sounds normal; no masses,  no organomegaly Extremities: extremities normal, atraumatic, no cyanosis or edema Wound: clean and dry  Lab Results: No results for input(s): WBC, HGB, HCT, PLT in the last 72 hours. BMET: No results for input(s): NA, K, CL, CO2, GLUCOSE, BUN, CREATININE, CALCIUM in the last 72 hours.  PT/INR: No results for input(s): LABPROT, INR in the last 72 hours. ABG    Component Value Date/Time   PHART 7.512 (H) 11/24/2019 0517   HCO3 33.6 (H) 11/24/2019 0517   TCO2 35 (H) 11/24/2019 0517   ACIDBASEDEF 1.0 11/21/2019 0502   O2SAT 69.2 11/28/2019 0500   CBG (last 3)  No results for input(s): GLUCAP in the last 72 hours.  Assessment/Plan: S/P Procedure(s) (LRB): CORONARY ARTERY BYPASS GRAFTING (CABG), ON PUMP, TIMES THREE, USING LEFT INTERNAL MAMMARY ARTERY  AND ENDOSCOPICALLY HARVESTED RIGHT GREATER SAPHENOUS VEIN (N/A) TRANSESOPHAGEAL ECHOCARDIOGRAM (TEE) (N/A)  1. CV-s/p NSTEMI. Previous bradycardia. NSR in the 60s, BP well controlled.  2. Pulm-tolerating room air with good oxygen saturation.  3. Renal-creatinine 0.87, electrolytes okay 4. H and H 12.1/37.3, expected acute blood loss anemia 5. Endo-blood glucose well controlled  Plan: If he gets a bed today I am happy to discharge.    LOS: 18 days    Sharlene Dory 12/05/2019   Agree with above dispo planning  Samarrah Tranchina O Geoffrey Mankin

## 2019-12-05 NOTE — Plan of Care (Signed)
  Problem: Clinical Measurements: Goal: Respiratory complications will improve Outcome: Progressing   Problem: Clinical Measurements: Goal: Will remain free from infection Outcome: Progressing   Problem: Clinical Measurements: Goal: Cardiovascular complication will be avoided Outcome: Progressing

## 2019-12-05 NOTE — Progress Notes (Signed)
CARDIAC REHAB PHASE I   PRE:  Rate/Rhythm: 66 SR    BP: sitting 116/99    SaO2: 99 RA  MODE:  Ambulation: 280 ft   POST:  Rate/Rhythm: 92 SR    BP: sitting 104/72     SaO2: 98 RA  Pt able to move to EOB independently but did push with left side some. I assisted in donning shoes. Able to stand independently. Walked with cane, gait belt assist for steadying. Increased distance. To recliner. No major c/o.  [id number]   Harriet Masson CES, ACSM 12/05/2019 2:24 PM

## 2019-12-06 ENCOUNTER — Other Ambulatory Visit: Payer: Self-pay | Admitting: Physician Assistant

## 2019-12-06 MED ORDER — AMIODARONE HCL 200 MG PO TABS: 200.0000 mg | ORAL_TABLET | Freq: Every day | ORAL | Status: DC

## 2019-12-06 MED ORDER — ROSUVASTATIN CALCIUM 20 MG PO TABS: 20.0000 mg | ORAL_TABLET | Freq: Every day | ORAL | 3 refills | Status: DC

## 2019-12-06 MED ORDER — TRAMADOL HCL 50 MG PO TABS: 50.0000 mg | ORAL_TABLET | Freq: Four times a day (QID) | ORAL | 0 refills | Status: DC | PRN

## 2019-12-06 MED ORDER — AMIODARONE HCL 200 MG PO TABS: 200.0000 mg | ORAL_TABLET | Freq: Every day | ORAL | 1 refills | Status: DC

## 2019-12-06 NOTE — TOC Transition Note (Addendum)
Transition of Care [facility]) - CM/SW Discharge Note   Patient Details  Name: [name] MRN: [id number] Date of Birth: 11-03-1935  Transition of Care [facility]) CM/SW Contact:  Eduard Roux, LCSWA Phone Number: 12/06/2019, 10:20 AM   Clinical Narrative:     Patient will DC to: [facility]  DC Date: 12/06/2019 Family Notified: [name],niece  Transport By: Sharin Mons @ 1pm  Patient's niece will be here to get copy of discharge paperwork.  RN, patient, and facility notified of DC. Discharge Summary sent to facility. RN given number for [phone], Room 104P. Ambulance transport requested for patient.   Clinical Social Worker signing off.  Antony Blackbird, MSW, LCSWA Clinical Social Worker    Final next level of care: Skilled Nursing Facility Barriers to Discharge: Barriers Resolved   Patient Goals and CMS Choice Patient states their goals for this hospitalization and ongoing recovery are:: [place] CMS Medicare.gov Compare Post Acute Care list provided to:: Patient Represenative (must comment) Choice offered to / list presented to : Patient  Discharge Placement              Patient chooses bed at: [facility]) Patient to be transferred to facility by: [facility] Name of family member notified: [name],niece Patient and family notified of of transfer: 12/06/19  Discharge Plan and Services                DME Arranged: Hospital bed(Qoute for hosptial bed) DME Agency: [facility] Date DME Agency Contacted: 12/03/19 Time DME Agency Contacted: 1109              Social Determinants of Health (SDOH) Interventions     Readmission Risk Interventions No flowsheet data found.

## 2019-12-06 NOTE — Progress Notes (Signed)
      [address]       [place] [address]             [phone]      16 Days Post-Op Procedure(s) (LRB): CORONARY ARTERY BYPASS GRAFTING (CABG), ON PUMP, TIMES THREE, USING LEFT INTERNAL MAMMARY ARTERY AND ENDOSCOPICALLY HARVESTED RIGHT GREATER SAPHENOUS VEIN (N/A) TRANSESOPHAGEAL ECHOCARDIOGRAM (TEE) (N/A) Subjective: Feels good this morning. Excited to be going to [facility] today.  Objective: Vital signs in last 24 hours: Temp:  [97.6 F (36.4 C)-98.2 F (36.8 C)] 97.9 F (36.6 C) (05/02 0410) Pulse Rate:  [63-72] 63 (05/02 0410) Cardiac Rhythm: Normal sinus rhythm (05/02 0000) Resp:  [14-24] 22 (05/02 0410) BP: (95-116)/(56-99) 110/73 (05/02 0410) SpO2:  [94 %-100 %] 99 % (05/02 0431)     Intake/Output from previous day: 05/01 0701 - 05/02 0700 In: 220 [P.O.:220] Out: 1625 [Urine:1625] Intake/Output this shift: No intake/output data recorded.  General appearance: alert, cooperative and no distress Heart: regular rate and rhythm, S1, S2 normal, no murmur, click, rub or gallop Lungs: clear to auscultation bilaterally Abdomen: soft, non-tender; bowel sounds normal; no masses,  no organomegaly Extremities: extremities normal, atraumatic, no cyanosis or edema Wound: clean and dry  Lab Results: No results for input(s): WBC, HGB, HCT, PLT in the last 72 hours. BMET: No results for input(s): NA, K, CL, CO2, GLUCOSE, BUN, CREATININE, CALCIUM in the last 72 hours.  PT/INR: No results for input(s): LABPROT, INR in the last 72 hours. ABG    Component Value Date/Time   PHART 7.512 (H) 11/24/2019 0517   HCO3 33.6 (H) 11/24/2019 0517   TCO2 35 (H) 11/24/2019 0517   ACIDBASEDEF 1.0 11/21/2019 0502   O2SAT 69.2 11/28/2019 0500   CBG (last 3)  No results for input(s): GLUCAP in the last 72 hours.  Assessment/Plan: S/P Procedure(s) (LRB): CORONARY ARTERY BYPASS GRAFTING (CABG), ON PUMP, TIMES THREE, USING LEFT INTERNAL MAMMARY ARTERY AND  ENDOSCOPICALLY HARVESTED RIGHT GREATER SAPHENOUS VEIN (N/A) TRANSESOPHAGEAL ECHOCARDIOGRAM (TEE) (N/A)  1. CV-s/p NSTEMI. Previous bradycardia. NSR in the 60s, BP well controlled.  2. Pulm-tolerating room air with good oxygen saturation.  3. Renal-creatinine 0.87, electrolytes okay 4. H and H 12.1/37.3, expected acute blood loss anemia 5. Endo-blood glucose well controlled 6. COVID 19 negative  Plan: Discharge to [facility] today.    LOS: 19 days    Sharlene Dory 12/06/2019

## 2019-12-06 NOTE — Progress Notes (Signed)
Called report to Engineer, structural at [facility]. Patient discharging via [facility]AR. IV out. Monitor off CCMD notified.  Ginette Otto, RN

## 2019-12-06 NOTE — Progress Notes (Signed)
Progress Note  Patient Name: [name] Date of Encounter: 12/06/2019  Primary Cardiologist: Lesleigh Noe, MD   Subjective   Patient feels well and excited to go home.  Inpatient Medications    Scheduled Meds: . amiodarone  200 mg Oral BID  . aspirin EC  81 mg Oral Daily  . bisacodyl  10 mg Oral Daily   Or  . bisacodyl  10 mg Rectal Daily  . chlorhexidine  15 mL Mouth Rinse BID  . Chlorhexidine Gluconate Cloth  6 each Topical Daily  . clopidogrel  75 mg Oral Daily  . docusate sodium  200 mg Oral Daily  . feeding supplement (ENSURE ENLIVE)  237 mL Oral TID BM  . furosemide  40 mg Oral Daily  . levalbuterol  0.63 mg Nebulization Once  . mouth rinse  15 mL Mouth Rinse q12n4p  . pantoprazole  40 mg Oral Daily  . rosuvastatin  20 mg Oral Daily  . sodium chloride flush  3 mL Intravenous Q12H   Continuous Infusions: . sodium chloride Stopped (11/23/19 0926)  . sodium chloride    . sodium chloride 20 mL/hr at 11/24/19 1300  . lactated ringers Stopped (11/24/19 0918)  . lactated ringers Stopped (11/25/19 0630)   PRN Meds: sodium chloride, acetaminophen, alum & mag hydroxide-simeth, food thickener, metoprolol tartrate, ondansetron (ZOFRAN) IV, traMADol   Vital Signs    Vitals:   12/06/19 0000 12/06/19 0410 12/06/19 0431 12/06/19 0847  BP:  110/73  94/62  Pulse:  63  61  Resp:  (!) 22  16  Temp:  97.9 F (36.6 C)  (!) 97.4 F (36.3 C)  TempSrc:  Oral  Oral  SpO2: 97% 100% 99% 100%  Weight:      Height:        Intake/Output Summary (Last 24 hours) at 12/06/2019 0951 Last data filed at 12/06/2019 0412 Gross per 24 hour  Intake 220 ml  Output 1625 ml  Net -1405 ml   Last 3 Weights 12/05/2019 12/04/2019 12/04/2019  Weight (lbs) 154 lb 1.6 oz 149 lb 0.5 oz 149 lb 0.5 oz  Weight (kg) 69.9 kg 67.6 kg 67.6 kg      Telemetry    Sinus rhythm, no atrial fibrillation- Personally Reviewed  ECG    No new- Personally Reviewed  Physical Exam   GEN: No acute  distress.   Neck: No JVD Cardiac: RRR, no murmurs, rubs, or gallops.  Chest scar noted Respiratory: Clear to auscultation bilaterally. GI: Soft, nontender, non-distended  MS: No edema; No deformity. Neuro:  Nonfocal  Psych: Normal affect   Labs    High Sensitivity Troponin:   Recent Labs  Lab 11/17/19 1324 11/17/19 1523 11/17/19 1822 11/17/19 2110  TROPONINIHS 1,867* 1,824* 1,630* 1,794*      Chemistry No results for input(s): NA, K, CL, CO2, GLUCOSE, BUN, CREATININE, CALCIUM, PROT, ALBUMIN, AST, ALT, ALKPHOS, BILITOT, GFRNONAA, GFRAA, ANIONGAP in the last 168 hours.   Hematology No results for input(s): WBC, RBC, HGB, HCT, MCV, MCH, MCHC, RDW, PLT in the last 168 hours.  BNPNo results for input(s): BNP, PROBNP in the last 168 hours.   DDimer No results for input(s): DDIMER in the last 168 hours.   Radiology    No results found.   Assessment & Plan    84 year old with non-ST elevation myocardial infarction with severe triple-vessel coronary artery disease status post CABG (severe left main disease)  CAD -Post CABG [place].  LIMA to LAD SVG  to RCA SVG to diagonal -Progressing well.  Discharged today, we will arrange for outpatient follow-up. -Amiodarone prophylaxis atrial fibrillation.  Currently in sinus rhythm.  I will decrease amiodarone to 200 mg once daily only.  Non-ST elevation myocardial infarction -EF was reduced at the time of cardiac catheterization 35 to 40% with mid inferior inferolateral wall hypokinesis. -Continue with aggressive secondary risk factor prevention.  On high intensity statin Crestor 20.  LDL goal less than 70.  Last check on 11/18/2019-56. -With non-STEMI, on clopidogrel.  Ischemic cardiomyopathy -EF 30 to 40%.  Hopefully we will see improvement post bypass. -When able Toprol/carvedilol, ACE ARB ARNI -He appears euvolemic, continue Lasix 40 mg daily.  For questions or updates, please contact [facility] Please consult [email or web address]  for contact info under     Signed, Ena Dawley, MD  12/06/2019, 9:51 AM

## 2019-12-07 ENCOUNTER — Ambulatory Visit (INDEPENDENT_AMBULATORY_CARE_PROVIDER_SITE_OTHER): Payer: Self-pay | Admitting: Cardiothoracic Surgery

## 2019-12-07 ENCOUNTER — Ambulatory Visit: Payer: Self-pay

## 2019-12-07 ENCOUNTER — Other Ambulatory Visit: Payer: Self-pay

## 2019-12-07 ENCOUNTER — Ambulatory Visit
Admission: RE | Admit: 2019-12-07 | Discharge: 2019-12-07 | Disposition: A | Discharge status: Home / Self Care | Payer: Medicare PPO | Source: Ambulatory Visit | Attending: Cardiothoracic Surgery | Admitting: Cardiothoracic Surgery

## 2019-12-07 ENCOUNTER — Telehealth: Payer: Self-pay

## 2019-12-07 ENCOUNTER — Encounter: Payer: Self-pay | Admitting: Cardiothoracic Surgery

## 2019-12-07 VITALS — BP 113/66 | HR 75 | Temp 96.8°F | Resp 18 | Wt 149.2 lb

## 2019-12-07 DIAGNOSIS — Z951 Presence of aortocoronary bypass graft: Secondary | ICD-10-CM

## 2019-12-07 DIAGNOSIS — I10 Essential (primary) hypertension: Secondary | ICD-10-CM

## 2019-12-07 DIAGNOSIS — I739 Peripheral vascular disease, unspecified: Secondary | ICD-10-CM

## 2019-12-07 NOTE — Chronic Care Management (AMB) (Signed)
  Chronic Care Management   Outreach Note  12/07/2019 Name: EZARIAH NACE MRN: [id number] DOB: [birth year]  Referred by: [name] FNP Reason for referral : Care Coordination   SW performed chart review to note patient discharged to [facility] for short term rehab on 12/06/2019. SW collaborated with Medical illustrator regarding patient current disposition.   Follow Up Plan: SW to follow up with the patient over the next 14 days to assess for ongoing care coordination needs.  Bevelyn Ngo, BSW, CDP Social Worker, Certified Dementia Practitioner [facility] [phone]

## 2019-12-08 ENCOUNTER — Telehealth: Payer: Self-pay | Admitting: Nurse Practitioner

## 2019-12-08 NOTE — Telephone Encounter (Signed)
Patient called to be screened for possible Homebound Covid vaccine. Patient is currently in SNF.

## 2019-12-09 NOTE — Progress Notes (Signed)
[address]       [place] [address]             [phone]     CARDIOTHORACIC SURGERY OFFICE NOTE  Referring Provider is Belva Crome, MD Primary Cardiologist is Sinclair Grooms, MD PCP is Patient, No Pcp Per   HPI:  84 year old man is status post CABG and ligation of left atrial appendage.  He did reasonably well in the hospital although did require a couple of extra days on the ventilator.  He diuresed well and is in a skilled nursing facility now after discharge.  There are no specific complaints other than his appetite is poor.  He denies chest pain or shortness of breath.   Current Outpatient Medications  Medication Sig Dispense Refill  . acetaminophen (TYLENOL) 325 MG tablet Take 2 tablets (650 mg total) by mouth every 4 (four) hours as needed for headache or mild pain.    [name] amiodarone (PACERONE) 200 MG tablet Take 1 tablet (200 mg total) by mouth daily. 30 tablet 1  . aspirin EC 81 MG tablet Take 81 mg by mouth daily.    . cetirizine (ZYRTEC) 10 MG tablet Take 10 mg by mouth as needed for allergies.    . Cholecalciferol (VITAMIN D) 50 MCG (2000 UT) tablet Take 2,000 Units by mouth daily.    . clopidogrel (PLAVIX) 75 MG tablet TAKE 1 TABLET BY MOUTH DAILY (Patient taking differently: Take 75 mg by mouth daily. ) 90 tablet 1  . diclofenac sodium (VOLTAREN) 1 % GEL Apply 2 g topically 4 (four) times daily. Rub into affected area of foot 2 to 4 times daily 100 g 2  . polyethylene glycol (MIRALAX / GLYCOLAX) 17 g packet Take 17 g by mouth daily. (Patient taking differently: Take 17 g by mouth daily as needed for mild constipation. ) 14 each 0  . rosuvastatin (CRESTOR) 20 MG tablet Take 1 tablet (20 mg total) by mouth daily. 30 tablet 3  . traMADol (ULTRAM) 50 MG tablet Take 1 tablet (50 mg total) by mouth every 6 (six) hours as needed for moderate pain. 30 tablet 0  . VITAMIN D PO Take 1 tablet by mouth daily.    [name] zinc gluconate 50 MG tablet Take 50  mg by mouth daily.     No current facility-administered medications for this visit.      Physical Exam:   BP 113/66 (BP Location: Right Arm, Patient Position: Sitting, Cuff Size: Normal)   Pulse 75   Temp (!) 96.8 F (36 C)   Resp 18   Wt 67.7 kg   SpO2 100% Comment: RA  BMI 24.09 kg/m   General:  Elderly, no acute distress  Chest:   Clear bilaterally  CV:   Regular rate and rhythm  Incisions:  Well-healed  Abdomen:  Soft  Extremities:  No edema  Diagnostic Tests:  X-ray with clear lung fields   Impression:  Doing well after CABG and ligation of left atrial appendage  Plan:  Continue present therapy.  He may liberalize sternal precautions to use his upper extremities as needed for mobility.  Hold Lasix.  Follow-up in 2 to 3 weeks  I spent in excess of 15 minutes during the conduct of this office consultation and >50% of this time involved direct face-to-face encounter with the patient for counseling and/or coordination of their care.  Level 2  10 minutes Level 3                 15 minutes Level 4                 25 minutes Level 5                 40 minutes  B. Lorayne Marek, MD 12/09/2019 9:18 AM

## 2019-12-09 NOTE — Addendum Note (Signed)
Addendum  created 12/09/19 0848 by Adair Laundry, CRNA   Order list changed

## 2019-12-10 ENCOUNTER — Encounter: Payer: Self-pay | Admitting: Cardiology

## 2019-12-10 ENCOUNTER — Ambulatory Visit: Payer: Medicare PPO | Attending: Internal Medicine

## 2019-12-10 ENCOUNTER — Ambulatory Visit (INDEPENDENT_AMBULATORY_CARE_PROVIDER_SITE_OTHER): Payer: Medicare PPO | Admitting: Cardiology

## 2019-12-10 ENCOUNTER — Other Ambulatory Visit: Payer: Self-pay

## 2019-12-10 VITALS — BP 112/60 | HR 65 | Ht 65.0 in | Wt 154.8 lb

## 2019-12-10 DIAGNOSIS — Z79899 Other long term (current) drug therapy: Secondary | ICD-10-CM

## 2019-12-10 DIAGNOSIS — Z951 Presence of aortocoronary bypass graft: Secondary | ICD-10-CM

## 2019-12-10 DIAGNOSIS — I214 Non-ST elevation (NSTEMI) myocardial infarction: Secondary | ICD-10-CM | POA: Diagnosis not present

## 2019-12-10 DIAGNOSIS — R634 Abnormal weight loss: Secondary | ICD-10-CM

## 2019-12-10 DIAGNOSIS — Z23 Encounter for immunization: Secondary | ICD-10-CM

## 2019-12-10 DIAGNOSIS — I255 Ischemic cardiomyopathy: Secondary | ICD-10-CM

## 2019-12-10 DIAGNOSIS — E782 Mixed hyperlipidemia: Secondary | ICD-10-CM

## 2019-12-10 NOTE — Patient Instructions (Signed)
Medication Instructions:   Your physician recommends that you continue on your current medications as directed. Please refer to the Current Medication list given to you today.   *If you need a refill on your cardiac medications before your next appointment, please call your pharmacy*   Lab Work: CMP AND TSH   If you have labs (blood work) drawn today and your tests are completely normal, you will receive your results only by: [name] MyChart Message (if you have MyChart) OR . A paper copy in the mail If you have any lab test that is abnormal or we need to change your treatment, we will call you to review the results.   Testing/Procedures:  IN 2 MONTHS Your physician has requested that you have an echocardiogram. Echocardiography is a painless test that uses sound waves to create images of your heart. It provides your doctor with information about the size and shape of your heart and how well your heart's chambers and valves are working. This procedure takes approximately one hour. There are no restrictions for this procedure.    Follow-Up: At [facility], you and your health needs are our priority.  As part of our continuing mission to provide you with exceptional heart care, we have created designated Provider Care Teams.  These Care Teams include your primary Cardiologist (physician) and Advanced Practice Providers (APPs -  Physician Assistants and Nurse Practitioners) who all work together to provide you with the care you need, when you need it.  We recommend signing up for the patient portal called "MyChart".  Sign up information is provided on this After Visit Summary.  MyChart is used to connect with patients for Virtual Visits (Telemedicine).  Patients are able to view lab/test results, encounter notes, upcoming appointments, etc.  Non-urgent messages can be sent to your provider as well.   To learn more about what you can do with MyChart, go to [email or web address].    Your next  appointment:   6 week(s)  The format for your next appointment:   In Person  Provider:   You may see Lesleigh Noe, MD    Other Instructions

## 2019-12-10 NOTE — Progress Notes (Signed)
Cardiology Office Note   Date:  12/10/2019   ID:  [name], DOB 1936/04/14, MRN [id number]  PCP:  Patient, No Pcp Per  Cardiologist:  Dr. Tamala Julian     Chief Complaint  Patient presents with  . Hospitalization Follow-up    post CABG      History of Present Illness: [name] is a 84 y.o. male who presents for post hospital   He was admitted 11/17/19 with chest pain about a month before admit was referred for RBBB and sinus bradycardia. At the visit HR were in the 60s and patient was asymptomatic. Dr. Tamala Julian reviewed duplex abd aortic aneurysm study from November 2020 which showed no evidence of aneurysm. The patient follows with Dr. Trula Slade for his PAD. ABIs and duplex showed significant findings including right iliac occlusion and left superficial femoral and external iliac artery stenosis for which he takes aspirin and plavix. He was seen again by [place] 11/03/19 in consult for an admission for a mechanical fall. In the ER he was level 2 trauma and was pan scanned which showed no acute fractures. Orthostatics normal. Echo showed EF 60-65% without RWMA. Troponin was elevated to 420 in the setting of elevated CK suspected demand ischemia. Heart rates remained in the 50s.He was discharged 11/05/18 home with home health.   Cardiac cath 11/18/19 with significant disease and underwent CABGAmiodarone 200 BID for 7 days then to 200 mg daily.  : LIMA-LAD, SVG-RCA, SVG-diagonal - 11/20/19 and Removal of left atrial appendage clot and left atrial appendage clipping (45 mm)   He did reasonably well in the hospital although did require a couple of extra days on the ventilator.  He diuresed well and is in a skilled nursing facility now after discharge.  There are no specific complaints other than his appetite is poor.  he blames it on facility   Dr Orvan Seen held lasix    Today his niece an Therapist, sports is with him.  He had lost weight prior to NSTEMI.  May have been due to his CAD.  Now not much appetite due to  food.  No chest pain or SOB.  His amiodarone is at 200 mg.  BP on lower end -do not want to add BB today.   He has Lt arm brace in place and Lt leg brace due to old CVA, he has condom catheter in place.   Very Hard of hearing.        Past Medical History:  Diagnosis Date  . Arthritis   . Bundle branch block   . Hypercholesteremia   . Hyperlipidemia 2001  . Hypertension 2001  . Hypertension   . Peripheral vascular disease ([place])   . Stroke ([place])   . Stroke [facility])    2001 - R side deficits    Past Surgical History:  Procedure Laterality Date  . ABDOMINAL AORTOGRAM N/A 11/18/2019   Procedure: ABDOMINAL AORTOGRAM;  Surgeon: Jettie Booze, MD;  Location: [place] [facility];  Service: Cardiovascular;  Laterality: N/A;  . CORONARY ARTERY BYPASS GRAFT N/A 11/20/2019   Procedure: CORONARY ARTERY BYPASS GRAFTING (CABG), ON PUMP, TIMES THREE, USING LEFT INTERNAL MAMMARY ARTERY AND ENDOSCOPICALLY HARVESTED RIGHT GREATER SAPHENOUS VEIN;  Surgeon: Wonda Olds, MD;  Location: [facility];  Service: Open Heart Surgery;  Laterality: N/A;  . LEFT HEART CATH AND CORONARY ANGIOGRAPHY N/A 11/18/2019   Procedure: LEFT HEART CATH AND CORONARY ANGIOGRAPHY;  Surgeon: Jettie Booze, MD;  Location: [place] [facility];  Service: Cardiovascular;  Laterality: N/A;  . TEE WITHOUT CARDIOVERSION N/A 11/20/2019   Procedure: TRANSESOPHAGEAL ECHOCARDIOGRAM (TEE);  Surgeon: Linden Dolin, MD;  Location: [facility] OR;  Service: Open Heart Surgery;  Laterality: N/A;     Current Outpatient Medications  Medication Sig Dispense Refill  . acetaminophen (TYLENOL) 325 MG tablet Take 2 tablets (650 mg total) by mouth every 4 (four) hours as needed for headache or mild pain.    [name] amiodarone (PACERONE) 200 MG tablet Take 1 tablet (200 mg total) by mouth daily. 30 tablet 1  . aspirin EC 81 MG tablet Take 81 mg by mouth daily.    . cetirizine (ZYRTEC) 10 MG tablet Take 10 mg by mouth as needed for allergies.    .  Cholecalciferol (VITAMIN D) 50 MCG (2000 UT) tablet Take 2,000 Units by mouth daily.    . clopidogrel (PLAVIX) 75 MG tablet TAKE 1 TABLET BY MOUTH DAILY 90 tablet 1  . diclofenac sodium (VOLTAREN) 1 % GEL Apply 2 g topically 4 (four) times daily. Rub into affected area of foot 2 to 4 times daily 100 g 2  . polyethylene glycol (MIRALAX / GLYCOLAX) 17 g packet Take 17 g by mouth daily. (Patient taking differently: Take 17 g by mouth daily as needed for mild constipation. ) 14 each 0  . rosuvastatin (CRESTOR) 20 MG tablet Take 1 tablet (20 mg total) by mouth daily. 30 tablet 3  . traMADol (ULTRAM) 50 MG tablet Take 1 tablet (50 mg total) by mouth every 6 (six) hours as needed for moderate pain. 30 tablet 0  . VITAMIN D PO Take 1 tablet by mouth daily.    [name] zinc gluconate 50 MG tablet Take 50 mg by mouth daily.     No current facility-administered medications for this visit.    Allergies:   Patient has no known allergies.    Social History:  The patient  reports that he has never smoked. He has never used smokeless tobacco. He reports that he does not drink alcohol or use drugs.   Family History:  The patient's family history includes Cancer in his brother; Heart attack in his father; Stroke in his father and mother.    ROS:  General:no colds or fevers, + weight loss since last summer, to see PCP Skin:no rashes or ulcers, incisions healing HEENT:no blurred vision, no congestion CV:see HPI PUL:see HPI GI:no diarrhea constipation or melena, no indigestion GU:no hematuria, no dysuria MS:no joint pain, no claudication Neuro:no syncope, no lightheadedness Endo:no diabetes, no thyroid disease  Wt Readings from Last 3 Encounters:  12/10/19 154 lb 12.8 oz (70.2 kg)  12/07/19 149 lb 3.2 oz (67.7 kg)  12/05/19 154 lb 1.6 oz (69.9 kg)   10/09/19 wt of 161 lbs and 06/26/19 was 166 lbs.  02/17/19 was 167 lbs  PHYSICAL EXAM: VS:  BP 112/60   Pulse 65   Ht 5\' 5"  (1.651 m)   Wt 154 lb 12.8 oz (70.2  kg)   SpO2 98%   BMI 25.76 kg/m  , BMI Body mass index is 25.76 kg/m. General:Pleasant affect, NAD Skin:Warm and dry, brisk capillary refill HEENT:normocephalic, sclera clear, mucus membranes moist Neck:supple, no JVD, no bruits  Heart:S1S2 RRR with soft 1/6  murmur, no gallup, rub or click Lungs:clear without rales, rhonchi, or wheezes , non tender, + BS, do not palpate liver spleen or masses Ext:no lower ext edema, 2+ pedal pulses, 2+ radial pulses brace on left leg, and Rt arm Neuro:alert and  oriented X3, MAE, follows commands, + facial symmetry    EKG:  EKG is ordered today. The ekg ordered today demonstrates SR rate 65 improved ST changes. Qtc is prolonged at 590 ms discussed with Dr. Katrinka Blazing and will check electrolytes.    Recent Labs: 11/17/2019: B Natriuretic Peptide 2,124.2; TSH 2.571 11/21/2019: ALT 23; Magnesium 2.7 11/28/2019: Hemoglobin 12.1; Platelets 227 11/29/2019: BUN 12; Creatinine, Ser 0.87; Potassium 4.5; Sodium 137    Lipid Panel    Component Value Date/Time   CHOL 108 11/18/2019 0222   CHOL 157 06/30/2019 1701   TRIG 44 11/18/2019 0222   HDL 43 11/18/2019 0222   HDL 61 06/30/2019 1701   CHOLHDL 2.5 11/18/2019 0222   VLDL 9 11/18/2019 0222   LDLCALC 56 11/18/2019 0222   LDLCALC 84 06/30/2019 1701       Other studies Reviewed: Additional studies/ records that were reviewed today include: . Significant Diagnostic Studies: angiography:    Ost RCA to Prox RCA lesion is 70% stenosed.  Mid RCA lesion is 75% stenosed.  Ost Cx to Prox Cx lesion is 100% stenosed.  Ost LM to Mid LM lesion is 75% stenosed.  Dist LM to Prox LAD lesion is 90% stenosed.  Mid LAD lesion is 25% stenosed.  There is moderate left ventricular systolic dysfunction.  LV end diastolic pressure is moderately elevated.  The left ventricular ejection fraction is 35-45% by visual estimate.  There is no aortic valve stenosis.  Severe three vessel disease including  heavily calcified left main and proximal LAD.   Treatments: surgery:    Coronary Artery Bypass Grafting x3 Left Internal Mammary Artery to Distal Left Anterior Descending Coronary Artery;Saphenous Vein Graft todistal right coronaryArtery;Saphenous Vein Graft to Diagonal Branch Coronary Artery;Endoscopic Vein Harvest fromright thigh and Lower Leg   Echo 11/19/19 1. Since the last study on 11/03/2019 LVEF has decreased from 60-65% to  35-40% with global hypokinesis and akinesis in the basal and mid inferior  and inferolateral walls. There is heavy smoke in the left ventricular apex  consistent with pre-thrombotic  state. A systemic anticoagulation should be considered.  2. Left ventricular ejection fraction, by estimation, is 35 to 40%. The  left ventricle has moderately decreased function. The left ventricle  demonstrates regional wall motion abnormalities (see scoring  diagram/findings for description). There is mild  concentric left ventricular hypertrophy. Left ventricular diastolic  parameters are consistent with Grade II diastolic dysfunction  (pseudonormalization). Elevated left atrial pressure.  3. Right ventricular systolic function is normal. The right ventricular  size is normal.  4. Left atrial size was mildly dilated.  5. Large pleural effusion in the left lateral region.  6. The mitral valve is normal in structure. Moderate mitral valve  regurgitation. No evidence of mitral stenosis.  7. The aortic valve is normal in structure. Aortic valve regurgitation is  not visualized. Mild to moderate aortic valve sclerosis/calcification is  present, without any evidence of aortic stenosis.  8. The inferior vena cava is normal in size with greater than 50%  respiratory variability, suggesting right atrial pressure of 3 mmHg.    Pre cabg dopplers Right Carotid: Velocities in the right ICA are consistent with a 1-39%  stenosis.   Left Carotid:  Velocities in the left ICA are consistent with a 1-39%  stenosis.  Vertebrals: Bilateral vertebral arteries demonstrate antegrade flow.   Right ABI: Resting right ankle-brachial index indicates moderate right  lower extremity arterial disease.  Left ABI: Resting left ankle-brachial index is within  normal range. No  evidence of significant left lower extremity arterial disease.  Left Upper Extremity: Doppler waveform obliterate with left radial  compression. Doppler waveform obliterate with left ulnar compression.     Intraoperative echo TEE Left Ventricle: has mildly reduced systolic function compared to  pre-bypass.  - Aorta: The aorta appears unchanged from pre-bypass.  - Aortic Valve: There is no regurgitation.  - Mitral Valve: The mitral valve appears unchanged from pre-bypass.  Moderate  regurgitation.  - Tricuspid Valve: The tricuspid valve appears unchanged from pre-bypass.  - Interatrial Septum: The interatrial septum appears unchanged from  pre-bypass.  - Pericardium: The pericardium appears unchanged from pre-bypass.  - Comments: Mitral valve clot not visualized.   ASSESSMENT AND PLAN:  1.  NSTEMI with CAD significant disease with need for CABG review of hospital note and labs and procedures  2.  CABG  : LIMA-LAD, SVG-RCA, SVG-diagonal - 11/20/19 and Removal of left atrial appendage clot and left atrial appendage clipping (45 mm) On ASA and plavix and statin.   3.  Amiodarone prophylaxis for a fib. Now on 200 mg daily qtc is long will check lytes if normal and he has not had a fib may just stop amiodarone.    4.  Ischemic cardiomyopathy though intraop EF improved, euvolemic today BP too  soft to add ARB or BB.  Will recheck echo in 2 months.  5.  HLD on statin, will recheck on next visit.    6.  Wt loss, per urology his prostate is stable.  Will see how he does once he recovers more.          Current medicines are reviewed with the patient today.  The patient  Has no concerns regarding medicines.  The following changes have been made:  See above Labs/ tests ordered today include:see above  Disposition:   FU:  see above  Signed, Nada Boozer, NP  12/10/2019 5:38 PM    [facility] [address] [place], [place], [place]  [address]/ [address] [place], [place] Phone: [phone]; Fax: [phone]  [phone]

## 2019-12-10 NOTE — Progress Notes (Signed)
   Covid-19 Vaccination Clinic  Name:  [name]    MRN: [id number] DOB: Dec 25, 1935  12/10/2019  [name] was observed post Covid-19 immunization for 15 minutes without incident. He was provided with Vaccine Information Sheet and instruction to access the V-Safe system.   [name] was instructed to call 911 with any severe reactions post vaccine: [name] Difficulty breathing  . Swelling of face and throat  . A fast heartbeat  . A bad rash all over body  . Dizziness and weakness   Immunizations Administered    Name Date Dose VIS Date Route   Moderna COVID-19 Vaccine 12/10/2019  2:42 PM 0.5 mL 07/2019 Intramuscular   Manufacturer: Moderna   Lot: [id number]   NDC: 02890-228-40

## 2019-12-11 ENCOUNTER — Ambulatory Visit (INDEPENDENT_AMBULATORY_CARE_PROVIDER_SITE_OTHER): Payer: Medicare PPO

## 2019-12-11 ENCOUNTER — Telehealth: Payer: Self-pay

## 2019-12-11 DIAGNOSIS — I10 Essential (primary) hypertension: Secondary | ICD-10-CM | POA: Diagnosis not present

## 2019-12-11 DIAGNOSIS — I639 Cerebral infarction, unspecified: Secondary | ICD-10-CM | POA: Diagnosis not present

## 2019-12-11 DIAGNOSIS — I214 Non-ST elevation (NSTEMI) myocardial infarction: Secondary | ICD-10-CM

## 2019-12-11 DIAGNOSIS — R7303 Prediabetes: Secondary | ICD-10-CM

## 2019-12-11 LAB — COMPREHENSIVE METABOLIC PANEL
ALT: 27 IU/L (ref 0–44)
AST: 24 IU/L (ref 0–40)
Albumin/Globulin Ratio: 1.3 (ref 1.2–2.2)
Albumin: 3.7 g/dL (ref 3.6–4.6)
Alkaline Phosphatase: 145 IU/L — ABNORMAL HIGH (ref 39–117)
BUN/Creatinine Ratio: 15 (ref 10–24)
BUN: 14 mg/dL (ref 8–27)
Bilirubin Total: 0.9 mg/dL (ref 0.0–1.2)
CO2: 21 mmol/L (ref 20–29)
Calcium: 8.9 mg/dL (ref 8.6–10.2)
Chloride: 101 mmol/L (ref 96–106)
Creatinine, Ser: 0.92 mg/dL (ref 0.76–1.27)
GFR calc Af Amer: 89 mL/min/{1.73_m2} (ref >59)
GFR calc non Af Amer: 77 mL/min/{1.73_m2} (ref >59)
Globulin, Total: 2.8 g/dL (ref 1.5–4.5)
Glucose: 91 mg/dL (ref 65–99)
Potassium: 4 mmol/L (ref 3.5–5.2)
Sodium: 135 mmol/L (ref 134–144)
Total Protein: 6.5 g/dL (ref 6.0–8.5)

## 2019-12-11 LAB — TSH: TSH: 4.16 u[IU]/mL (ref 0.450–4.500)

## 2019-12-14 ENCOUNTER — Other Ambulatory Visit (HOSPITAL_COMMUNITY): Payer: Self-pay | Admitting: *Deleted

## 2019-12-14 ENCOUNTER — Telehealth: Payer: Self-pay

## 2019-12-14 ENCOUNTER — Ambulatory Visit: Payer: Self-pay

## 2019-12-14 ENCOUNTER — Telehealth (HOSPITAL_COMMUNITY): Payer: Self-pay

## 2019-12-14 DIAGNOSIS — I739 Peripheral vascular disease, unspecified: Secondary | ICD-10-CM

## 2019-12-14 DIAGNOSIS — Z951 Presence of aortocoronary bypass graft: Secondary | ICD-10-CM

## 2019-12-14 DIAGNOSIS — I214 Non-ST elevation (NSTEMI) myocardial infarction: Secondary | ICD-10-CM

## 2019-12-14 DIAGNOSIS — I10 Essential (primary) hypertension: Secondary | ICD-10-CM

## 2019-12-14 NOTE — Telephone Encounter (Signed)
lpmtcb 5/10 

## 2019-12-14 NOTE — Telephone Encounter (Signed)
Pt insurance is active and benefits verified through [employer] Medicare Co-pay $20, DED 0/0 met, out of pocket $4,000/$105.29 met, co-insurance 0%. no pre-authorization required. Passport, 12/14/2019@4:09pm, REF# [id number]  Will contact patient to see if he is interested in the Cardiac Rehab Program. If interested, patient will need to complete follow up appt. Once completed, patient will be contacted for scheduling upon review by the RN Navigator.  

## 2019-12-14 NOTE — Telephone Encounter (Signed)
-----   Message from Leone Brand, NP sent at 12/11/2019  4:28 PM EDT ----- Labs stable

## 2019-12-14 NOTE — Chronic Care Management (AMB) (Signed)
  Chronic Care Management   Outreach Note  12/14/2019 Name: [name] MRN: [id number] DOB: 07/29/1936  Referred by: [name], FNP Reason for referral : Care Coordination   SW placed an unsuccessful outbound call to the patients niece, Weldon Picking, to assess for patient current disposition and care coordination needs. SW left a HIPAA compliant voice message requesting a return call.  Follow Up Plan: The care management team will reach out to the patient again over the next 14 days.   Bevelyn Ngo, BSW, CDP Social Worker, Certified Dementia Practitioner [facility] Care Management [phone]

## 2019-12-14 NOTE — Telephone Encounter (Signed)
The patient's niece has been notified of the result and verbalized understanding.  All questions (if any) were answered. Sigurd Sos, RN 12/14/2019 8:51 AM

## 2019-12-14 NOTE — Chronic Care Management (AMB) (Signed)
Chronic Care Management   Follow Up Note   12/11/2019 Name: [name] MRN: [id number] DOB: Dec 29, 1935  Referred by: Patient, No Pcp Per Reason for referral : Chronic Care Management (FU RN CM Call - inbound call from caregiver)   [name] is a 84 y.o. year old male who is a primary care patient of Patient, No Pcp Per. The CCM team was consulted for assistance with chronic disease management and care coordination needs.    Review of patient status, including review of consultants reports, relevant laboratory and other test results, and collaboration with appropriate care team members and the patient's provider was performed as part of comprehensive patient evaluation and provision of chronic care management services.    SDOH (Social Determinants of Health) assessments performed: No See Care Plan activities for detailed interventions related to SDOH)   Inbound call from niece Weldon Picking to discuss patient's planned d/c home from [facility].    Outpatient Encounter Medications as of 12/11/2019  Medication Sig  . acetaminophen (TYLENOL) 325 MG tablet Take 2 tablets (650 mg total) by mouth every 4 (four) hours as needed for headache or mild pain.  [name] amiodarone (PACERONE) 200 MG tablet Take 1 tablet (200 mg total) by mouth daily.  [name] aspirin EC 81 MG tablet Take 81 mg by mouth daily.  . cetirizine (ZYRTEC) 10 MG tablet Take 10 mg by mouth as needed for allergies.  . Cholecalciferol (VITAMIN D) 50 MCG (2000 UT) tablet Take 2,000 Units by mouth daily.  . clopidogrel (PLAVIX) 75 MG tablet TAKE 1 TABLET BY MOUTH DAILY  . diclofenac sodium (VOLTAREN) 1 % GEL Apply 2 g topically 4 (four) times daily. Rub into affected area of foot 2 to 4 times daily  . polyethylene glycol (MIRALAX / GLYCOLAX) 17 g packet Take 17 g by mouth daily. (Patient taking differently: Take 17 g by mouth daily as needed for mild constipation. )  . rosuvastatin (CRESTOR) 20 MG tablet Take 1 tablet  (20 mg total) by mouth daily.  . traMADol (ULTRAM) 50 MG tablet Take 1 tablet (50 mg total) by mouth every 6 (six) hours as needed for moderate pain.  [name] VITAMIN D PO Take 1 tablet by mouth daily.  [name] zinc gluconate 50 MG tablet Take 50 mg by mouth daily.   No facility-administered encounter medications on file as of 12/11/2019.     Objective:  Lab Results  Component Value Date   HGBA1C 6.0 (H) 06/30/2019   HGBA1C 6.0 (H) 02/17/2019   HGBA1C 5.9 (H) 05/15/2018   Lab Results  Component Value Date   LDLCALC 56 11/18/2019   CREATININE 0.92 12/10/2019   BP Readings from Last 3 Encounters:  12/10/19 112/60  12/07/19 113/66  12/06/19 (!) 101/57    Goals Addressed            This Visit's Progress   . Post discharge follow up       CARE PLAN ENTRY (see longitudinal plan of care for additional care plan information)  Current Barriers:  [name] Knowledge Deficits related to home safety and fall prevention  . Chronic Disease Management support and education needs related to HTN, CVA, prediabetes  Nurse Case Manager Clinical Goal(s):  [name] Over the next 30 days, patient will work with in home PT/OT for strengthening and balance and will adhere to his recommended HEP plan.  . Over the next 90 days, patient will work with CCM RN CM and PCP to address needs  related to disease education and support to improve Self Health management of chronic conditions . Over the next 90 days, patient will experience having no falls or injury's related to falls   CCM RN CM Interventions:  12/11/19 inbound call completed with niece [name] . Inter-disciplinary care team collaboration (see longitudinal plan of care) . Determined patient was readmitted to [facility] on 11/17/19-12/06/19, dx: NSTEMI (non-ST elevated myocardial infarction) and is s/p CABG . Discussed [name] is recovering very well and was transferred to [facility] for rehabilitation . Determined the planned d/c  is expected to occur on 12/18/19 and [name] will return home with ongoing care assistance to be received by niece [name] and other family members  . Discussed [name] will contact the CCM team once [name] is d/c home to review and determine services needed . Discussed plans with patient for ongoing care management follow up and provided patient with direct contact information for care management team  Patient Self Care Activities:  . Self administers medications as prescribed (niece [name] fills medication cups) . Attends all scheduled provider appointments . Performs ADL's independently . Performs IADL's independently  Please see past updates related to this goal by clicking on the "Past Updates" button in the selected goal         Plan:   Telephone follow up appointment with care management team member scheduled for: 12/18/19  Barb Merino, RN, BSN, CCM Care Management Coordinator [place] [facility]  Direct Phone: [phone]

## 2019-12-14 NOTE — Patient Instructions (Addendum)
Visit Information  Goals Addressed    . Post discharge follow up       CARE PLAN ENTRY (see longitudinal plan of care for additional care plan information)  Current Barriers:  [name] Knowledge Deficits related to home safety and fall prevention  . Chronic Disease Management support and education needs related to HTN, CVA, prediabetes  Nurse Case Manager Clinical Goal(s):  [name] Over the next 30 days, patient will work with in home PT/OT for strengthening and balance and will adhere to his recommended HEP plan.  . Over the next 90 days, patient will work with CCM RN CM and PCP to address needs related to disease education and support to improve Self Health management of chronic conditions . Over the next 90 days, patient will experience having no falls or injury's related to falls   CCM RN CM Interventions:  12/11/19 inbound call completed with niece Weldon Picking . Inter-disciplinary care team collaboration (see longitudinal plan of care) . Determined patient was readmitted to [facility] on 11/17/19-12/06/19, dx: NSTEMI (non-ST elevated myocardial infarction) and is s/p CABG . Discussed Mr. Kornegay is recovering very well and was transferred to [facility] for rehabilitation . Determined the planned d/c is expected to occur on 12/18/19 and Mr. Thrall will return home with ongoing care assistance to be received by niece [name] and other family members  . Discussed [name] will contact the CCM team once Mr. Elenes is d/c home to review and determine services needed . Discussed plans with patient for ongoing care management follow up and provided patient with direct contact information for care management team  Patient Self Care Activities:  . Self administers medications as prescribed (niece [name] fills medication cups) . Attends all scheduled provider appointments . Performs ADL's independently . Performs IADL's independently  Please see past updates related to this goal by  clicking on the "Past Updates" button in the selected goal        Patient verbalizes understanding of instructions provided today.   Telephone follow up appointment with care management team member scheduled for: 12/18/19  Delsa Sale, RN, BSN, CCM Care Management Coordinator [facility] Care Management/[facility]  Direct Phone: [phone]

## 2019-12-14 NOTE — Telephone Encounter (Signed)
Patients niece returning call.

## 2019-12-17 ENCOUNTER — Inpatient Hospital Stay: Payer: Medicare PPO | Admitting: Nurse Practitioner

## 2019-12-18 ENCOUNTER — Telehealth: Payer: Self-pay

## 2019-12-21 ENCOUNTER — Other Ambulatory Visit: Payer: Self-pay

## 2019-12-21 ENCOUNTER — Telehealth: Payer: Self-pay

## 2019-12-21 ENCOUNTER — Ambulatory Visit: Payer: Self-pay

## 2019-12-21 DIAGNOSIS — I10 Essential (primary) hypertension: Secondary | ICD-10-CM

## 2019-12-21 DIAGNOSIS — I639 Cerebral infarction, unspecified: Secondary | ICD-10-CM

## 2019-12-21 DIAGNOSIS — R7303 Prediabetes: Secondary | ICD-10-CM

## 2019-12-21 DIAGNOSIS — I214 Non-ST elevation (NSTEMI) myocardial infarction: Secondary | ICD-10-CM

## 2019-12-22 ENCOUNTER — Encounter: Payer: Self-pay | Admitting: *Deleted

## 2019-12-22 ENCOUNTER — Other Ambulatory Visit: Payer: Self-pay | Admitting: *Deleted

## 2019-12-22 DIAGNOSIS — J90 Pleural effusion, not elsewhere classified: Secondary | ICD-10-CM

## 2019-12-22 DIAGNOSIS — Z79899 Other long term (current) drug therapy: Secondary | ICD-10-CM

## 2019-12-22 MED ORDER — POTASSIUM CHLORIDE CRYS ER 10 MEQ PO TBCR: 20.0000 meq | EXTENDED_RELEASE_TABLET | Freq: Every day | ORAL | 0 refills | Status: DC

## 2019-12-22 MED ORDER — FUROSEMIDE 40 MG PO TABS: 40.0000 mg | ORAL_TABLET | Freq: Every day | ORAL | 0 refills | Status: DC

## 2019-12-22 NOTE — Progress Notes (Signed)
[name] daughter called earlier today with concerns of his SOBOE.  His Lasix was held at his last visit with Dr. Vickey Sages.  She says he is having trouble getting his shoes on.  He says "they are tighter than before."  I consulted with Dr. Vickey Sages and he said his Lasix could be resumed.  He has a f/u on Monday, 12/28/19 with a CXR.  LASIX 40 MG/K 20 MEQ was called to his preferred pharmacy.

## 2019-12-22 NOTE — Chronic Care Management (AMB) (Addendum)
  Chronic Care Management   Outreach Note  12/21/2019 Name: KENTAVIUS DETTORE MRN: [id number] DOB: Sep 27, 1935  Referred by: Patient, No Pcp Per Reason for referral : Chronic Care Management (FU RN Call - post d/c)   An unsuccessful telephone outreach was attempted today. The patient was referred to the case management team for assistance with care management and care coordination.   Follow Up Plan: Telephone follow up appointment with care management team member scheduled for: 01/21/20  Delsa Sale, RN, BSN, CCM Care Management Coordinator [facility] Care Management/[facility]  Direct Phone: [phone]

## 2019-12-23 ENCOUNTER — Ambulatory Visit: Payer: Self-pay

## 2019-12-23 ENCOUNTER — Telehealth: Payer: Self-pay

## 2019-12-23 DIAGNOSIS — I10 Essential (primary) hypertension: Secondary | ICD-10-CM

## 2019-12-23 NOTE — Chronic Care Management (AMB) (Signed)
  Chronic Care Management   Outreach Note  12/23/2019 Name: DEMARIS LEAVELL MRN: [id number] DOB: [birth year]  Referred by: [name], FNP Reason for referral : Care Coordination  SW placed second unsuccessful outbound call to the patients niece and caregiver Weldon Picking to assist with care coordination needs. SW left a HIPAA compliant voice message requesting a return call.   Follow Up Plan: The care management team will reach out to the patient again over the next 14 days.   Bevelyn Ngo, BSW, CDP Social Worker, Certified Dementia Practitioner [facility] [phone]

## 2019-12-24 NOTE — Telephone Encounter (Signed)
error 

## 2019-12-25 ENCOUNTER — Other Ambulatory Visit: Payer: Self-pay | Admitting: Cardiothoracic Surgery

## 2019-12-25 DIAGNOSIS — Z951 Presence of aortocoronary bypass graft: Secondary | ICD-10-CM

## 2019-12-28 ENCOUNTER — Other Ambulatory Visit: Payer: Self-pay

## 2019-12-28 ENCOUNTER — Ambulatory Visit
Admission: RE | Admit: 2019-12-28 | Discharge: 2019-12-28 | Disposition: A | Discharge status: Home / Self Care | Payer: Medicare PPO | Source: Ambulatory Visit | Attending: Cardiothoracic Surgery | Admitting: Cardiothoracic Surgery

## 2019-12-28 ENCOUNTER — Ambulatory Visit (INDEPENDENT_AMBULATORY_CARE_PROVIDER_SITE_OTHER): Payer: Self-pay | Admitting: Cardiothoracic Surgery

## 2019-12-28 ENCOUNTER — Encounter: Payer: Self-pay | Admitting: Cardiothoracic Surgery

## 2019-12-28 ENCOUNTER — Telehealth: Payer: Self-pay

## 2019-12-28 VITALS — BP 123/64 | HR 75 | Resp 18 | Ht 65.0 in | Wt 156.8 lb

## 2019-12-28 DIAGNOSIS — Z951 Presence of aortocoronary bypass graft: Secondary | ICD-10-CM

## 2019-12-28 DIAGNOSIS — I2510 Atherosclerotic heart disease of native coronary artery without angina pectoris: Secondary | ICD-10-CM

## 2019-12-28 DIAGNOSIS — J90 Pleural effusion, not elsewhere classified: Secondary | ICD-10-CM

## 2019-12-28 NOTE — Telephone Encounter (Signed)
[name] Physical Therapist from [facility] called in and needed a Verbal Order for PT and Home Health. 1 wk 1, 2 wk 2, 1 wk 6   Verbal Orders were given.

## 2019-12-29 ENCOUNTER — Ambulatory Visit: Payer: Self-pay

## 2019-12-29 ENCOUNTER — Telehealth: Payer: Self-pay

## 2019-12-29 DIAGNOSIS — I10 Essential (primary) hypertension: Secondary | ICD-10-CM

## 2019-12-29 NOTE — Chronic Care Management (AMB) (Signed)
  Chronic Care Management   Outreach Note  12/29/2019 Name: [name] MRN: [id number] DOB: Jun 22, 1936  Referred by: Patient, No Pcp Per Reason for referral : Care Coordination   Third unsuccessful telephone outreach was attempted today. The patient was referred to the case management team for assistance with care management and care coordination. The patient's primary care provider has been notified of our unsuccessful attempts to make or maintain contact with the patient. The care management team is pleased to engage with this patient at any time in the future should he/she be interested in assistance from the care management team.   Follow Up Plan: No SW follow up planned at this time. All SW related goals have been closed due to inability to establish and maintain patient contact. Telephone follow up call scheduled with RN Care Manager for June 17.  Bevelyn Ngo, BSW, CDP Social Worker, Certified Dementia Practitioner [facility] Care Management [phone]

## 2020-01-01 ENCOUNTER — Other Ambulatory Visit: Payer: Self-pay | Admitting: Nurse Practitioner

## 2020-01-05 ENCOUNTER — Other Ambulatory Visit: Payer: Self-pay

## 2020-01-05 ENCOUNTER — Ambulatory Visit: Payer: Medicare PPO | Admitting: Nurse Practitioner

## 2020-01-05 ENCOUNTER — Encounter: Payer: Self-pay | Admitting: Nurse Practitioner

## 2020-01-05 VITALS — BP 124/80 | HR 69 | Temp 97.6°F | Ht 72.0 in | Wt 156.4 lb

## 2020-01-05 DIAGNOSIS — G8191 Hemiplegia, unspecified affecting right dominant side: Secondary | ICD-10-CM | POA: Diagnosis not present

## 2020-01-05 DIAGNOSIS — R972 Elevated prostate specific antigen [PSA]: Secondary | ICD-10-CM

## 2020-01-05 DIAGNOSIS — I214 Non-ST elevation (NSTEMI) myocardial infarction: Secondary | ICD-10-CM

## 2020-01-05 DIAGNOSIS — Z951 Presence of aortocoronary bypass graft: Secondary | ICD-10-CM | POA: Diagnosis not present

## 2020-01-05 NOTE — Progress Notes (Signed)
This visit occurred during the SARS-CoV-2 public health emergency.  Safety protocols were in place, including screening questions prior to the visit, additional usage of staff PPE, and extensive cleaning of exam room while observing appropriate contact time as indicated for disinfecting solutions.  Subjective:     Patient ID: [name] , male    DOB: May 06, 1936 , 84 y.o.   MRN: [id number]   Chief Complaint  Patient presents with  . Hospitalization Follow-up    patient has been ok he does have some swelling in his ankles     HPI  Here for post rehab follow up discharged on May 17th.    He continues to have home health PT/OT and speech therapy.  They saw some swallowing difficulty after surgery and the nursing home followed up with that and continued at home.  They have not required any diagnostic study, felt was doing okay with his swallowing, eating regular foods.  PT/OT is two times a week.  He does not have a nurse visiting as well.  His niece is doing the medications.  She has called the surgeon due to the patient having shortness of breath which she feels may have been an isolated sign, (was struggling to put his brace on), his lasix was restarted when went to see him xray was fine and the lasix was discontinued again.  Reported still has effusions on the chest xray but did not think was enough to cause shortness of breath - size common after surgery.    He is scheduled to see Cardiology in June.  He is scheduled to have another ECHO later this month.   Wt Readings from Last 3 Encounters: 01/05/20 : 156 lb 6.4 oz (70.9 kg) 12/28/19 : 156 lb 12.8 oz (71.1 kg) 12/10/19 : 154 lb 12.8 oz (70.2 kg)  She reports he had been losing weight but has noticed his appetite has improved after the surgery.      Past Medical History:  Diagnosis Date  . Arthritis   . Bundle branch block   . Hypercholesteremia   . Hyperlipidemia 2001  . Hypertension 2001  . Hypertension   . Peripheral  vascular disease ([place])   . Stroke ([place])   . Stroke [facility])    2001 - R side deficits     Family History  Problem Relation Age of Onset  . Stroke Mother   . Stroke Father   . Heart attack Father   . Cancer Brother      Current Outpatient Medications:  .  acetaminophen (TYLENOL) 325 MG tablet, Take 2 tablets (650 mg total) by mouth every 4 (four) hours as needed for headache or mild pain., Disp:  , Rfl:  .  aspirin EC 81 MG tablet, Take 81 mg by mouth daily., Disp: , Rfl:  .  cetirizine (ZYRTEC) 10 MG tablet, Take 10 mg by mouth as needed for allergies., Disp: , Rfl:  .  Cholecalciferol (VITAMIN D) 50 MCG (2000 UT) tablet, Take 2,000 Units by mouth daily., Disp: , Rfl:  .  clopidogrel (PLAVIX) 75 MG tablet, TAKE 1 TABLET BY MOUTH DAILY, Disp: 90 tablet, Rfl: 1 .  diclofenac sodium (VOLTAREN) 1 % GEL, Apply 2 g topically 4 (four) times daily. Rub into affected area of foot 2 to 4 times daily, Disp: 100 g, Rfl: 2 .  polyethylene glycol (MIRALAX / GLYCOLAX) 17 g packet, Take 17 g by mouth daily. (Patient taking differently: Take 17 g by mouth daily as needed for  mild constipation. ), Disp: 14 each, Rfl: 0 .  rosuvastatin (CRESTOR) 20 MG tablet, Take 1 tablet (20 mg total) by mouth daily., Disp: 30 tablet, Rfl: 3 .  traMADol (ULTRAM) 50 MG tablet, Take 1 tablet (50 mg total) by mouth every 6 (six) hours as needed for moderate pain., Disp: 30 tablet, Rfl: 0 .  zinc gluconate 50 MG tablet, Take 50 mg by mouth daily., Disp: , Rfl:    No Known Allergies   Review of Systems  Constitutional: Negative.   Respiratory: Negative.   Cardiovascular: Negative.  Negative for chest pain, palpitations and leg swelling.  Musculoskeletal:       Right lower extremity is in brace and right upper extremity with brace.   Neurological: Negative for dizziness, weakness and headaches.  Psychiatric/Behavioral: Negative.      Today's Vitals   01/05/20 1553  BP: 124/80  Pulse: 69  Temp: 97.6 F (36.4 C)   TempSrc: Oral  Weight: 156 lb 6.4 oz (70.9 kg)  Height: 6' (1.829 m)  PainSc: 0-No pain   Body mass index is 21.21 kg/m.   Objective:  Physical Exam Constitutional:      Appearance: Normal appearance.  Cardiovascular:     Rate and Rhythm: Normal rate and regular rhythm.     Pulses: Normal pulses.     Heart sounds: Normal heart sounds.  Pulmonary:     Effort: Pulmonary effort is normal. No respiratory distress.     Breath sounds: Normal breath sounds.  Musculoskeletal:        General: No swelling or tenderness.     Comments: Right hemiparesis with braces to right upper extremity and right lower extremity  Skin:    Capillary Refill: Capillary refill takes less than 2 seconds.  Neurological:     General: No focal deficit present.     Mental Status: He is alert and oriented to person, place, and time.     Cranial Nerves: No cranial nerve deficit.  Psychiatric:        Mood and Affect: Mood normal.        Behavior: Behavior normal.        Thought Content: Thought content normal.        Judgment: Judgment normal.         Assessment And Plan:    1. NSTEMI (non-ST elevated myocardial infarction) (HCC) TCM Performed. A member of the clinical team spoke with the patient upon dischare. Discharge summary was reviewed in full detail during the visit. Meds reconciled and compared to discharge meds. Medication list is updated and reviewed with the patient.  Greater than 50% face to face time was spent in counseling an coordination of care.  All questions were answered to the satisfaction of the patient.   He is to continue follow up with Cardiology  2. Right hemiparesis (HCC)  Would like a new brace for his right foot.    Will refer to orthopedics for a new brace  3. Elevated PSA  He seen his urologist in February and agreed to have yearly rectal exams.   4. Hx of CABG  His EF was 30-40 %, thoughts by the cardiologist that it would improve after the bypass       [name], FNP    THE PATIENT IS ENCOURAGED TO PRACTICE SOCIAL DISTANCING DUE TO THE COVID-19 PANDEMIC.

## 2020-01-05 NOTE — Progress Notes (Signed)
[address]       [place] [address]             [phone]     CARDIOTHORACIC SURGERY OFFICE NOTE  Referring Provider is Belva Crome, MD Primary Cardiologist is Sinclair Grooms, MD PCP is Patient, No Pcp Per   HPI:  84 yo man s/p CABG; has been doing much better recently--since last visit. Out of rehab and back home. Denies chest pain. Has occasional SOB but this is unclear if related to volume overload/pulm edema.    Current Outpatient Medications  Medication Sig Dispense Refill  . acetaminophen (TYLENOL) 325 MG tablet Take 2 tablets (650 mg total) by mouth every 4 (four) hours as needed for headache or mild pain.    [name] amiodarone (PACERONE) 200 MG tablet Take 1 tablet (200 mg total) by mouth daily. 30 tablet 1  . aspirin EC 81 MG tablet Take 81 mg by mouth daily.    . cetirizine (ZYRTEC) 10 MG tablet Take 10 mg by mouth as needed for allergies.    . Cholecalciferol (VITAMIN D) 50 MCG (2000 UT) tablet Take 2,000 Units by mouth daily.    . clopidogrel (PLAVIX) 75 MG tablet TAKE 1 TABLET BY MOUTH DAILY 90 tablet 1  . diclofenac sodium (VOLTAREN) 1 % GEL Apply 2 g topically 4 (four) times daily. Rub into affected area of foot 2 to 4 times daily 100 g 2  . furosemide (LASIX) 40 MG tablet Take 1 tablet (40 mg total) by mouth daily. Take one tablet daily 30 tablet 0  . polyethylene glycol (MIRALAX / GLYCOLAX) 17 g packet Take 17 g by mouth daily. (Patient taking differently: Take 17 g by mouth daily as needed for mild constipation. ) 14 each 0  . potassium chloride (KLOR-CON) 10 MEQ tablet Take 2 tablets (20 mEq total) by mouth daily. 60 tablet 0  . rosuvastatin (CRESTOR) 20 MG tablet Take 1 tablet (20 mg total) by mouth daily. 30 tablet 3  . traMADol (ULTRAM) 50 MG tablet Take 1 tablet (50 mg total) by mouth every 6 (six) hours as needed for moderate pain. 30 tablet 0  . VITAMIN D PO Take 1 tablet by mouth daily.    [name] zinc gluconate 50 MG tablet Take 50 mg  by mouth daily.     No current facility-administered medications for this visit.      Physical Exam:   BP 123/64 (BP Location: Left Arm, Patient Position: Sitting, Cuff Size: Normal)   Pulse 75   Resp 18   Ht 5\' 5"  (1.651 m)   Wt 71.1 kg   SpO2 94% Comment: RA  BMI 26.09 kg/m   General:  Well-appearing, NAD  Chest:   cta  CV:   rrr  Incisions:  C/d/i  Abdomen:  sntnd  Extremities:  No significant edema  Diagnostic Tests:  CXR with clear lung fields   Impression:  Doing well after CABG  Plan:  F/u as needed with thoracic surgery Ok to use upper extremities ad lib without concern for sternal precautions F/u with cardiology/primary care  I spent in excess of 20 minutes during the conduct of this office consultation and >50% of this time involved direct face-to-face encounter with the patient for counseling and/or coordination of their care.  Level 2                 10 minutes Level 3  15 minutes Level 4                 25 minutes Level 5                 40 minutes  B. Lorayne Marek, MD 01/05/2020 7:35 AM

## 2020-01-12 ENCOUNTER — Ambulatory Visit: Payer: Medicare PPO | Attending: Family

## 2020-01-12 DIAGNOSIS — Z23 Encounter for immunization: Secondary | ICD-10-CM

## 2020-01-12 NOTE — Progress Notes (Signed)
   Covid-19 Vaccination Clinic  Name:  [name]    MRN: [id number] DOB: 25-Jan-1936  01/12/2020  [name] was observed post Covid-19 immunization for 15 minutes without incident. He was provided with Vaccine Information Sheet and instruction to access the V-Safe system.   [name] was instructed to call 911 with any severe reactions post vaccine: [name] Difficulty breathing  . Swelling of face and throat  . A fast heartbeat  . A bad rash all over body  . Dizziness and weakness   Immunizations Administered    Name Date Dose VIS Date Route   Moderna COVID-19 Vaccine 01/12/2020 12:42 PM 0.5 mL 07/2019 Intramuscular   Manufacturer: Gala Murdoch   Lot: [id number]   NDC: 54650-354-65

## 2020-01-18 ENCOUNTER — Other Ambulatory Visit (HOSPITAL_COMMUNITY): Payer: Medicare PPO

## 2020-01-21 ENCOUNTER — Other Ambulatory Visit: Payer: Self-pay

## 2020-01-21 ENCOUNTER — Telehealth: Payer: Self-pay

## 2020-01-21 ENCOUNTER — Ambulatory Visit: Payer: Self-pay

## 2020-01-21 DIAGNOSIS — I10 Essential (primary) hypertension: Secondary | ICD-10-CM

## 2020-01-21 DIAGNOSIS — R7303 Prediabetes: Secondary | ICD-10-CM

## 2020-01-21 DIAGNOSIS — I639 Cerebral infarction, unspecified: Secondary | ICD-10-CM

## 2020-01-21 DIAGNOSIS — Z951 Presence of aortocoronary bypass graft: Secondary | ICD-10-CM

## 2020-01-21 NOTE — Telephone Encounter (Signed)
Darcus Austin from [facility] called and stated patient's family declined physical therapy this week due to having a death in the family. YL,RMA

## 2020-01-22 NOTE — Chronic Care Management (AMB) (Signed)
°  Chronic Care Management   Outreach Note  01/22/2020 Name: [name] MRN: [id number] DOB: September 01, 1935  Referred by: [name], FNP Reason for referral : Chronic Care Management (FU RN CM Call - post d/c )   An unsuccessful telephone outreach was attempted today. The patient was referred to the case management team for assistance with care management and care coordination.   Follow Up Plan: Telephone follow up appointment with care management team member scheduled for: 03/07/20  Delsa Sale, RN, BSN, CCM Care Management Coordinator [facility] Care Management/[facility]  Direct Phone: [phone]

## 2020-01-25 ENCOUNTER — Ambulatory Visit: Payer: Medicare PPO | Admitting: Interventional Cardiology

## 2020-01-26 NOTE — Telephone Encounter (Signed)
Okay noted

## 2020-02-04 DIAGNOSIS — I10 Essential (primary) hypertension: Secondary | ICD-10-CM

## 2020-02-04 DIAGNOSIS — I739 Peripheral vascular disease, unspecified: Secondary | ICD-10-CM

## 2020-02-04 DIAGNOSIS — I214 Non-ST elevation (NSTEMI) myocardial infarction: Secondary | ICD-10-CM

## 2020-02-04 DIAGNOSIS — I69351 Hemiplegia and hemiparesis following cerebral infarction affecting right dominant side: Secondary | ICD-10-CM

## 2020-02-05 ENCOUNTER — Other Ambulatory Visit: Payer: Self-pay

## 2020-02-05 ENCOUNTER — Ambulatory Visit (HOSPITAL_COMMUNITY): Payer: Medicare PPO | Attending: Cardiology

## 2020-02-05 ENCOUNTER — Other Ambulatory Visit: Payer: Self-pay | Admitting: Nurse Practitioner

## 2020-02-05 DIAGNOSIS — I255 Ischemic cardiomyopathy: Secondary | ICD-10-CM | POA: Insufficient documentation

## 2020-02-05 MED ORDER — PERFLUTREN LIPID MICROSPHERE
1.0000 mL | INTRAVENOUS | Status: AC | PRN
  Administered 2020-02-05: 1 mL via INTRAVENOUS

## 2020-02-06 ENCOUNTER — Other Ambulatory Visit: Payer: Self-pay | Admitting: Nurse Practitioner

## 2020-02-16 ENCOUNTER — Other Ambulatory Visit (HOSPITAL_COMMUNITY): Payer: Medicare PPO

## 2020-02-18 ENCOUNTER — Encounter: Payer: Self-pay | Admitting: Nurse Practitioner

## 2020-02-18 ENCOUNTER — Other Ambulatory Visit: Payer: Self-pay

## 2020-02-18 ENCOUNTER — Ambulatory Visit: Payer: Medicare PPO | Admitting: Nurse Practitioner

## 2020-02-18 ENCOUNTER — Ambulatory Visit (INDEPENDENT_AMBULATORY_CARE_PROVIDER_SITE_OTHER): Payer: Medicare PPO

## 2020-02-18 VITALS — BP 118/60 | HR 68 | Temp 97.5°F | Ht 72.0 in | Wt 161.0 lb

## 2020-02-18 DIAGNOSIS — Z0000 Encounter for general adult medical examination without abnormal findings: Secondary | ICD-10-CM | POA: Diagnosis not present

## 2020-02-18 DIAGNOSIS — N390 Urinary tract infection, site not specified: Secondary | ICD-10-CM | POA: Diagnosis not present

## 2020-02-18 DIAGNOSIS — Z7189 Other specified counseling: Secondary | ICD-10-CM | POA: Diagnosis not present

## 2020-02-18 DIAGNOSIS — R7303 Prediabetes: Secondary | ICD-10-CM

## 2020-02-18 DIAGNOSIS — I10 Essential (primary) hypertension: Secondary | ICD-10-CM

## 2020-02-18 LAB — POCT UA - MICROALBUMIN
Albumin/Creatinine Ratio, Urine, POC: 300
Creatinine, POC: 200 mg/dL
Microalbumin Ur, POC: 80 mg/L

## 2020-02-18 LAB — POCT URINALYSIS DIPSTICK
Bilirubin, UA: NEGATIVE
Blood, UA: NEGATIVE
Glucose, UA: NEGATIVE
Ketones, UA: NEGATIVE
Nitrite, UA: POSITIVE
Protein, UA: POSITIVE — AB
Spec Grav, UA: 1.01 (ref 1.010–1.025)
Urobilinogen, UA: 0.2 E.U./dL
pH, UA: 5.5 (ref 5.0–8.0)

## 2020-02-18 MED ORDER — SULFAMETHOXAZOLE-TRIMETHOPRIM 800-160 MG PO TABS: 1.0000 | ORAL_TABLET | Freq: Two times a day (BID) | ORAL | 0 refills | Status: DC

## 2020-02-18 NOTE — Assessment & Plan Note (Signed)
   Chronic, stable  Encouraged to avoid sugary foods and drinks  No current medications

## 2020-02-18 NOTE — Assessment & Plan Note (Signed)
.   B/P is controlled.  . CMP ordered to check renal function.  . The importance of regular exercise and dietary modification to include low salt diet was stressed to the patient.  . Stressed importance of losing ten percent of her body weight to help with B/P control.  . The weight loss would help with decreasing cardiac and cancer risk as well.

## 2020-02-18 NOTE — Progress Notes (Signed)
This visit occurred during the SARS-CoV-2 public health emergency.  Safety protocols were in place, including screening questions prior to the visit, additional usage of staff PPE, and extensive cleaning of exam room while observing appropriate contact time as indicated for disinfecting solutions.  Subjective:   [name] is a 84 y.o. male who presents for Medicare Annual/Subsequent preventive examination.  Review of Systems     Cardiac Risk Factors include: advanced age (>75men, >57 women);hypertension;male gender;sedentary lifestyle     Objective:    Today's Vitals   02/18/20 1106  BP: 118/60  Pulse: 68  Temp: (!) 97.5 F (36.4 C)  TempSrc: Oral  SpO2: 98%  Weight: 161 lb (73 kg)  Height: 6' (1.829 m)   Body mass index is 21.84 kg/m.  Advanced Directives 02/18/2020 11/19/2019 11/04/2019 11/03/2019 05/11/2019 02/17/2019 05/15/2018  Does Patient Have a Medical Advance Directive? Yes No - No Yes Yes Yes  Type of Advance Directive Healthcare Power of Attorney - - - Healthcare Power of [employer] Power of [employer] Power of Attorney  Does patient want to make changes to medical advance directive? - - - - No - Patient declined - -  Copy of Healthcare Power of Attorney in Chart? No - copy requested - - - - No - copy requested No - copy requested  Would patient like information on creating a medical advance directive? - No - Patient declined No - Patient declined - - - -    Current Medications (verified) Outpatient Encounter Medications as of 02/18/2020  Medication Sig  . acetaminophen (TYLENOL) 325 MG tablet Take 2 tablets (650 mg total) by mouth every 4 (four) hours as needed for headache or mild pain.  [name] aspirin EC 81 MG tablet Take 81 mg by mouth daily.  . cetirizine (ZYRTEC) 10 MG tablet Take 10 mg by mouth as needed for allergies.  . Cholecalciferol (VITAMIN D) 50 MCG (2000 UT) tablet Take 2,000 Units by mouth daily.  . clopidogrel (PLAVIX) 75 MG tablet  TAKE 1 TABLET BY MOUTH DAILY  . diclofenac sodium (VOLTAREN) 1 % GEL Apply 2 g topically 4 (four) times daily. Rub into affected area of foot 2 to 4 times daily  . psyllium (METAMUCIL) 58.6 % packet Take 1 packet by mouth daily.  . rosuvastatin (CRESTOR) 20 MG tablet Take 1 tablet (20 mg total) by mouth daily.  . traMADol (ULTRAM) 50 MG tablet Take 1 tablet (50 mg total) by mouth every 6 (six) hours as needed for moderate pain.  [name] zinc gluconate 50 MG tablet Take 50 mg by mouth daily.  . polyethylene glycol (MIRALAX / GLYCOLAX) 17 g packet Take 17 g by mouth daily. (Patient not taking: Reported on 02/18/2020)   No facility-administered encounter medications on file as of 02/18/2020.    Allergies (verified) Patient has no known allergies.   History: Past Medical History:  Diagnosis Date  . Arthritis   . Bundle branch block   . Hypercholesteremia   . Hyperlipidemia 2001  . Hypertension 2001  . Hypertension   . Peripheral vascular disease (HCC)   . Stroke (HCC)   . Stroke [facility])    2001 - R side deficits   Past Surgical History:  Procedure Laterality Date  . ABDOMINAL AORTOGRAM N/A 11/18/2019   Procedure: ABDOMINAL AORTOGRAM;  Surgeon: Corky Crafts, MD;  Location: [facility] INVASIVE CV LAB;  Service: Cardiovascular;  Laterality: N/A;  . CORONARY ARTERY BYPASS GRAFT N/A 11/20/2019   Procedure: CORONARY ARTERY BYPASS GRAFTING (  CABG), ON PUMP, TIMES THREE, USING LEFT INTERNAL MAMMARY ARTERY AND ENDOSCOPICALLY HARVESTED RIGHT GREATER SAPHENOUS VEIN;  Surgeon: Linden Dolin, MD;  Location: [facility] OR;  Service: Open Heart Surgery;  Laterality: N/A;  . LEFT HEART CATH AND CORONARY ANGIOGRAPHY N/A 11/18/2019   Procedure: LEFT HEART CATH AND CORONARY ANGIOGRAPHY;  Surgeon: Corky Crafts, MD;  Location: [facility] INVASIVE CV LAB;  Service: Cardiovascular;  Laterality: N/A;  . TEE WITHOUT CARDIOVERSION N/A 11/20/2019   Procedure: TRANSESOPHAGEAL ECHOCARDIOGRAM (TEE);  Surgeon: Linden Dolin, MD;   [facility];  Service: Open Heart Surgery;  Laterality: N/A;   Family History  Problem Relation Age of Onset  . Stroke Mother   . Stroke Father   . Heart attack Father   . Cancer Brother    Social History   Socioeconomic History  . Marital status: Single    Spouse name: Not on file  . Number of children: Not on file  . Years of education: Not on file  . Highest education level: Not on file  Occupational History  . Occupation: retired  Tobacco Use  . Smoking status: Never Smoker  . Smokeless tobacco: Never Used  Vaping Use  . Vaping Use: Never assessed  Substance and Sexual Activity  . Alcohol use: Never  . Drug use: Never  . Sexual activity: Not Currently  Other Topics Concern  . Not on file  Social History Narrative   ** Merged History Encounter **       Social Determinants of Health   Financial Resource Strain: Low Risk   . Difficulty of Paying Living Expenses: Not hard at all  Food Insecurity: No Food Insecurity  . Worried About Programme researcher, broadcasting/film/video in the Last Year: Never true  . Ran Out of Food in the Last Year: Never true  Transportation Needs: No Transportation Needs  . Lack of Transportation (Medical): No  . Lack of Transportation (Non-Medical): No  Physical Activity: Inactive  . Days of Exercise per Week: 0 days  . Minutes of Exercise per Session: 0 min  Stress: No Stress Concern Present  . Feeling of Stress : Not at all  Social Connections:   . Frequency of Communication with Friends and Family:   . Frequency of Social Gatherings with Friends and Family:   . Attends Religious Services:   . Active Member of Clubs or Organizations:   . Attends Banker Meetings:   [name] Marital Status:     Tobacco Counseling Counseling given: Not Answered   Clinical Intake:  Pre-visit preparation completed: Yes  Pain : No/denies pain     Nutritional Status: BMI of 19-24  Normal Nutritional Risks: None Diabetes: No  How often do you need to  have someone help you when you read instructions, pamphlets, or other written materials from your doctor or pharmacy?: 1 - Never What is the last grade level you completed in school?: 6th grade  Diabetic? no  Interpreter Needed?: No  Information entered by :: NAllen LPN   Activities of Daily Living In your present state of health, do you have any difficulty performing the following activities: 02/18/2020 11/17/2019  Hearing? Y Y  Comment no hearing aide -  Vision? N N  Difficulty concentrating or making decisions? N N  Walking or climbing stairs? Y Y  Comment uses cane -  Dressing or bathing? N Y  Doing errands, shopping? Y Y  Comment always has someone with -  Preparing Food and eating ? N -  Using the Toilet? N -  In the past six months, have you accidently leaked urine? Y -  Do you have problems with loss of bowel control? N -  Managing your Medications? Y -  Comment niece helps manage -  Managing your Finances? Y -  Comment niece manages -  Housekeeping or managing your Housekeeping? Y -  Some recent data might be hidden    Patient Care Team: [name], FNP as PCP - General (General Practice) Lyn Records, MD as PCP - Cardiology (Cardiology) Mateo Flow, MD as Consulting Physician (Ophthalmology) [facility], [place], MD as Attending Physician Clarene Duke, Karma Lew, RN as Case Manager Bevelyn Ngo as Social Worker Jeani Hawking, MD as Consulting Physician (Gastroenterology) [name], FNP (General Practice)  Indicate any recent Medical Services you may have received from other than [facility] providers in the past year (date may be approximate).     Assessment:   This is a routine wellness examination for Nasiir.  Hearing/Vision screen  Hearing Screening             Right ear:           Left ear:           Vision Screening Comments: Regular eye exams, Dr. Elmer Picker  Dietary issues and exercise  activities discussed: Current Exercise Habits: The patient does not participate in regular exercise at present  Goals    .  "We could use some help with medication management" (pt-stated)      CARE PLAN ENTRY (see longitudinal plan of care for additional care plan information)  Current Barriers:  [name] Knowledge Deficits related to best options for medication management of Polypharmacy . Chronic Disease Management support and education needs related to HTN, CVA, prediabetes   Nurse Case Manager Clinical Goal(s):  [name] Over the next 30 days, patient will work with embedded Pharm D to address needs related to medication management and adherence   CCM RN CM Interventions:  . Inter-disciplinary care team collaboration (see longitudinal plan of care) . Reviewed medications with patient and discussed patient had 1 medication change post d/c; Determined his HCTZ was discontinued; Determined patient is self administering his medications with the help from his niece [name], she is filling individual medication cups and the patient is able to Self administer his own medications; Determined niece is concerned about using this system and would like for a Pharmacist to help with medication management with Polypharmacy needs . Discussed plans with patient for ongoing care management follow up and provided patient with direct contact information for care management team . Pharmacy referral for assistance with Medication management   11/25/19 CCM Case Collaboration  . Received update from embedded Pharm D Haynes Hoehn advising she received and reviewed the pharmacy referral sent, she noted the patient was admitted to [facility] on [facility]/17/19 with dx: Nstemi (non-ST elevated myocardial infarction) with planned transition to SNF upon discharge . Discussed the pharmacy referral will be closed at this time   Patient Self Care Activities:  . Self administers medications as prescribed (niece [name] fills medication  cups) . Attends all scheduled provider appointments . Performs ADL's independently . Performs IADL's independently  Please see past updates related to this goal by clicking on the "Past Updates" button in the selected goal      .  DIET - INCREASE WATER INTAKE (pt-stated)      Wants to increase water intake form 2 to 4 glasses a day    .  Patient Stated      02/17/2020, niece states manage A1C better and exercise (treadmill or stationary bike), Remain safe from falling    .  Post discharge follow up      CARE PLAN ENTRY (see longitudinal plan of care for additional care plan information)  Current Barriers:  [name] Knowledge Deficits related to home safety and fall prevention  . Chronic Disease Management support and education needs related to HTN, CVA, prediabetes  Nurse Case Manager Clinical Goal(s):  [name] Over the next 30 days, patient will work with in home PT/OT for strengthening and balance and will adhere to his recommended HEP plan.  . Over the next 90 days, patient will work with CCM RN CM and PCP to address needs related to disease education and support to improve Self Health management of chronic conditions . Over the next 90 days, patient will experience having no falls or injury's related to falls   CCM RN CM Interventions:  12/11/19 inbound call completed with niece Weldon Picking . Inter-disciplinary care team collaboration (see longitudinal plan of care) . Determined patient was readmitted to [facility] on 11/17/19-12/06/19, dx: NSTEMI (non-ST elevated myocardial infarction) and is s/p CABG . Discussed Mr. Bertsch is recovering very well and was transferred to [facility] for rehabilitation . Determined the planned d/c is expected to occur on 12/18/19 and Mr. Briner will return home with ongoing care assistance to be received by niece [name] and other family members  . Discussed [name] will contact the CCM team once Mr. Jachim is d/c home to review and  determine services needed . Discussed plans with patient for ongoing care management follow up and provided patient with direct contact information for care management team  Patient Self Care Activities:  . Self administers medications as prescribed (niece [name] fills medication cups) . Attends all scheduled provider appointments . Performs ADL's independently . Performs IADL's independently  Please see past updates related to this goal by clicking on the "Past Updates" button in the selected goal        Depression Screen PHQ 2/9 Scores 02/18/2020 02/17/2019 11/18/2018 05/15/2018  PHQ - 2 Score 0 0 0 0  PHQ- 9 Score - 0 - 0    Fall Risk Fall Risk  02/18/2020 06/30/2019 02/17/2019 11/18/2018 05/15/2018  Falls in the past year? 1 0 0 0 No  Comment slipped with socks - - - -  Number falls in past yr: 0 - - - -  Injury with Fall? 1 - - - -  Risk for fall due to : History of fall(s);Impaired balance/gait;Impaired mobility - Impaired balance/gait;Medication side effect;Impaired mobility - Impaired balance/gait;Medication side effect;Impaired mobility  Follow up Falls evaluation completed;Education provided;Falls prevention discussed - Falls evaluation completed;Education provided;Falls prevention discussed - -    Any stairs in or around the home? No  If so, are there any without handrails? n/a Home free of loose throw rugs in walkways, pet beds, electrical cords, etc? Yes  Adequate lighting in your home to reduce risk of falls? Yes   ASSISTIVE DEVICES UTILIZED TO PREVENT FALLS:  Life alert? No  Use of a cane, walker or w/c? Yes  Grab bars in the bathroom? Yes  Shower chair or bench in shower? Yes  Elevated toilet seat or a handicapped toilet? Yes   TIMED UP AND GO:  Was the test performed? No .    Gait slow and steady with assistive device  Cognitive Function:  6CIT Screen 02/17/2019 05/15/2018  What Year? 4 points 4 points  What month? 3 points 0 points  What time? 0  points 3 points  Count back from 20 4 points 4 points  Months in reverse 4 points 4 points  Repeat phrase 10 points 10 points  Total Score 25 25    Immunizations Immunization History  Administered Date(s) Administered  . Influenza, High Dose Seasonal PF 06/28/2018  . Influenza-Unspecified 03/24/2018  . Moderna SARS-COVID-2 Vaccination 12/10/2019, 01/12/2020  . Pneumococcal Conjugate-13 09/18/2017  . Tdap 06/22/2013, 11/03/2019    TDAP status: Up to date Flu Vaccine status: Up to date Pneumococcal vaccine status: Up to date Covid-19 vaccine status: Completed vaccines  Qualifies for Shingles Vaccine? Yes   Zostavax completed No   Shingrix Completed?: No.    Education has been provided regarding the importance of this vaccine. Patient has been advised to call insurance company to determine out of pocket expense if they have not yet received this vaccine. Advised may also receive vaccine at local pharmacy or Health Dept. Verbalized acceptance and understanding.  Screening Tests Health Maintenance  Topic Date Due  . INFLUENZA VACCINE  03/06/2020  . TETANUS/TDAP  11/02/2029  . COVID-19 Vaccine  Completed  . PNA vac Low Risk Adult  Completed    Health Maintenance  There are no preventive care reminders to display for this patient.  Colorectal cancer screening: No longer required.   Lung Cancer Screening: (Low Dose CT Chest recommended if Age 12-80 years, 30 pack-year currently smoking OR have quit w/in 15years.) does not qualify.   Lung Cancer Screening Referral: no  Additional Screening:  Hepatitis C Screening: does not qualify  Vision Screening: Recommended annual ophthalmology exams for early detection of glaucoma and other disorders of the eye. Is the patient up to date with their annual eye exam?  Yes  Who is the provider or what is the name of the office in which the patient attends annual eye exams? [facility] If pt is not established with a provider, would  they like to be referred to a provider to establish care? No .   Dental Screening: Recommended annual dental exams for proper oral hygiene  Community Resource Referral / Chronic Care Management: CRR required this visit?  No   CCM required this visit?  No      Plan:     I have personally reviewed and noted the following in the patient's chart:   . Medical and social history . Use of alcohol, tobacco or illicit drugs  . Current medications and supplements . Functional ability and status . Nutritional status . Physical activity . Advanced directives . List of other physicians . Hospitalizations, surgeries, and ER visits in previous 12 months . Vitals . Screenings to include cognitive, depression, and falls . Referrals and appointments  In addition, I have reviewed and discussed with patient certain preventive protocols, quality metrics, and best practice recommendations. A written personalized care plan for preventive services as well as general preventive health recommendations were provided to patient.     Barb Merino, LPN   [id number]   Nurse Notes: 6 CIT not completed. Niece states patient has learning comprehension impairment.

## 2020-02-18 NOTE — Patient Instructions (Signed)
[name] , Thank you for taking time to come for your Medicare Wellness Visit. I appreciate your ongoing commitment to your health goals. Please review the following plan we discussed and let me know if I can assist you in the future.   Screening recommendations/referrals: Colonoscopy: not required Recommended yearly ophthalmology/optometry visit for glaucoma screening and checkup Recommended yearly dental visit for hygiene and checkup  Vaccinations: Influenza vaccine: completed 06/29/2019, due 03/06/2020 Pneumococcal vaccine: completed 09/18/2017 Tdap vaccine: completed 11/03/2019, due 11/02/2029 Shingles vaccine: discussed   Covid-19: 01/12/2020, 12/10/2019  Advanced directives: Please bring a copy of your POA (Power of Attorney) and/or Living Will to your next appointment.   Conditions/risks identified: none  Next appointment: 07/05/2020 at 3:30  Follow up in one year for your annual wellness visit.   Preventive Care 65 Years and Older, Male Preventive care refers to lifestyle choices and visits with your health care provider that can promote health and wellness. What does preventive care include?  A yearly physical exam. This is also called an annual well check.  Dental exams once or twice a year.  Routine eye exams. Ask your health care provider how often you should have your eyes checked.  Personal lifestyle choices, including:  Daily care of your teeth and gums.  Regular physical activity.  Eating a healthy diet.  Avoiding tobacco and drug use.  Limiting alcohol use.  Practicing safe sex.  Taking low doses of aspirin every day.  Taking vitamin and mineral supplements as recommended by your health care provider. What happens during an annual well check? The services and screenings done by your health care provider during your annual well check will depend on your age, overall health, lifestyle risk factors, and family history of disease. Counseling  Your health care  provider may ask you questions about your:  Alcohol use.  Tobacco use.  Drug use.  Emotional well-being.  Home and relationship well-being.  Sexual activity.  Eating habits.  History of falls.  Memory and ability to understand (cognition).  Work and work Astronomer. Screening  You may have the following tests or measurements:  Height, weight, and BMI.  Blood pressure.  Lipid and cholesterol levels. These may be checked every 5 years, or more frequently if you are over 58 years old.  Skin check.  Lung cancer screening. You may have this screening every year starting at age 52 if you have a 30-pack-year history of smoking and currently smoke or have quit within the past 15 years.  Fecal occult blood test (FOBT) of the stool. You may have this test every year starting at age 12.  Flexible sigmoidoscopy or colonoscopy. You may have a sigmoidoscopy every 5 years or a colonoscopy every 10 years starting at age 42.  Prostate cancer screening. Recommendations will vary depending on your family history and other risks.  Hepatitis C blood test.  Hepatitis B blood test.  Sexually transmitted disease (STD) testing.  Diabetes screening. This is done by checking your blood sugar (glucose) after you have not eaten for a while (fasting). You may have this done every 1-3 years.  Abdominal aortic aneurysm (AAA) screening. You may need this if you are a current or former smoker.  Osteoporosis. You may be screened starting at age 64 if you are at high risk. Talk with your health care provider about your test results, treatment options, and if necessary, the need for more tests. Vaccines  Your health care provider may recommend certain vaccines, such as:  Influenza  vaccine. This is recommended every year.  Tetanus, diphtheria, and acellular pertussis (Tdap, Td) vaccine. You may need a Td booster every 10 years.  Zoster vaccine. You may need this after age 46.  Pneumococcal  13-valent conjugate (PCV13) vaccine. One dose is recommended after age 22.  Pneumococcal polysaccharide (PPSV23) vaccine. One dose is recommended after age 2. Talk to your health care provider about which screenings and vaccines you need and how often you need them. This information is not intended to replace advice given to you by your health care provider. Make sure you discuss any questions you have with your health care provider. Document Released: 08/19/2015 Document Revised: 04/11/2016 Document Reviewed: 05/24/2015 Elsevier Interactive Patient Education  2017 [place] Prevention in the Home Falls can cause injuries. They can happen to people of all ages. There are many things you can do to make your home safe and to help prevent falls. What can I do on the outside of my home?  Regularly fix the edges of walkways and driveways and fix any cracks.  Remove anything that might make you trip as you walk through a door, such as a raised step or threshold.  Trim any bushes or trees on the path to your home.  Use bright outdoor lighting.  Clear any walking paths of anything that might make someone trip, such as rocks or tools.  Regularly check to see if handrails are loose or broken. Make sure that both sides of any steps have handrails.  Any raised decks and porches should have guardrails on the edges.  Have any leaves, snow, or ice cleared regularly.  Use sand or salt on walking paths during winter.  Clean up any spills in your garage right away. This includes oil or grease spills. What can I do in the bathroom?  Use night lights.  Install grab bars by the toilet and in the tub and shower. Do not use towel bars as grab bars.  Use non-skid mats or decals in the tub or shower.  If you need to sit down in the shower, use a plastic, non-slip stool.  Keep the floor dry. Clean up any water that spills on the floor as soon as it happens.  Remove soap buildup in the  tub or shower regularly.  Attach bath mats securely with double-sided non-slip rug tape.  Do not have throw rugs and other things on the floor that can make you trip. What can I do in the bedroom?  Use night lights.  Make sure that you have a light by your bed that is easy to reach.  Do not use any sheets or blankets that are too big for your bed. They should not hang down onto the floor.  Have a firm chair that has side arms. You can use this for support while you get dressed.  Do not have throw rugs and other things on the floor that can make you trip. What can I do in the kitchen?  Clean up any spills right away.  Avoid walking on wet floors.  Keep items that you use a lot in easy-to-reach places.  If you need to reach something above you, use a strong step stool that has a grab bar.  Keep electrical cords out of the way.  Do not use floor polish or wax that makes floors slippery. If you must use wax, use non-skid floor wax.  Do not have throw rugs and other things on the floor that can  make you trip. What can I do with my stairs?  Do not leave any items on the stairs.  Make sure that there are handrails on both sides of the stairs and use them. Fix handrails that are broken or loose. Make sure that handrails are as long as the stairways.  Check any carpeting to make sure that it is firmly attached to the stairs. Fix any carpet that is loose or worn.  Avoid having throw rugs at the top or bottom of the stairs. If you do have throw rugs, attach them to the floor with carpet tape.  Make sure that you have a light switch at the top of the stairs and the bottom of the stairs. If you do not have them, ask someone to add them for you. What else can I do to help prevent falls?  Wear shoes that:  Do not have high heels.  Have rubber bottoms.  Are comfortable and fit you well.  Are closed at the toe. Do not wear sandals.  If you use a stepladder:  Make sure that it  is fully opened. Do not climb a closed stepladder.  Make sure that both sides of the stepladder are locked into place.  Ask someone to hold it for you, if possible.  Clearly mark and make sure that you can see:  Any grab bars or handrails.  First and last steps.  Where the edge of each step is.  Use tools that help you move around (mobility aids) if they are needed. These include:  Canes.  Walkers.  Scooters.  Crutches.  Turn on the lights when you go into a dark area. Replace any light bulbs as soon as they burn out.  Set up your furniture so you have a clear path. Avoid moving your furniture around.  If any of your floors are uneven, fix them.  If there are any pets around you, be aware of where they are.  Review your medicines with your doctor. Some medicines can make you feel dizzy. This can increase your chance of falling. Ask your doctor what other things that you can do to help prevent falls. This information is not intended to replace advice given to you by your health care provider. Make sure you discuss any questions you have with your health care provider. Document Released: 05/19/2009 Document Revised: 12/29/2015 Document Reviewed: 08/27/2014 Elsevier Interactive Patient Education  2017 [employer].

## 2020-02-18 NOTE — Progress Notes (Signed)
This visit occurred during the SARS-CoV-2 public health emergency.  Safety protocols were in place, including screening questions prior to the visit, additional usage of staff PPE, and extensive cleaning of exam room while observing appropriate contact time as indicated for disinfecting solutions.  Subjective:     Patient ID: [name] , male    DOB: [birth year] , 84 y.o.   MRN: [id number]   Chief Complaint  Patient presents with  . Hypertension  . Insomnia    HPI  Here for routine follow up 4 month. He had a 2 D Echo and is to follow up with Cardiologist in 2 weeks.  He has not had his brace done due to a loss in the family.    Family has noticed having irritability and rummaging throughout the house at night.  She noticed after the surgery he would have problems with stopping breathing at night and snoring.  Has not noticed the stopping of breathing in the last month while sleeping but continues to snore.  Awakens at 4 am every morning. Will dose off when watching TV.  Does not seem to be fatigued in the morning when waking up in the morning the family is not there with him initially.  Has not noticed lack of energy.    Wt Readings from Last 3 Encounters: 02/18/20 : 161 lb (73 kg) 01/05/20 : 156 lb 6.4 oz (70.9 kg) 12/28/19 : 156 lb 12.8 oz (71.1 kg)  He is here today with his niece [name]  Hypertension This is a chronic problem. The current episode started more than 1 year ago. The problem is unchanged. The problem is controlled. Pertinent negatives include no anxiety, blurred vision, chest pain, headaches, palpitations or shortness of breath. There are no associated agents to hypertension. Risk factors for coronary artery disease include male gender and sedentary lifestyle. Past treatments include ACE inhibitors and diuretics. Hypertensive end-organ damage includes CVA. There is no history of angina. There is no history of a thyroid problem.  Insomnia Primary symptoms:  sleep disturbance (not sleeping as well since his heart surgery.).  The current episode started more than one month. The onset quality is sudden. The problem occurs nightly. The problem has been gradually worsening since onset. Typical bedtime:  10-11 P.M..  PMH includes: hypertension, no depression, no chronic pain, no apnea. Prior diagnostic workup includes:  No prior workup.     Past Medical History:  Diagnosis Date  . Arthritis   . Bundle branch block   . Hypercholesteremia   . Hyperlipidemia 2001  . Hypertension 2001  . Hypertension   . Peripheral vascular disease ([place])   . Stroke ([place])   . Stroke [facility])    2001 - R side deficits     Family History  Problem Relation Age of Onset  . Stroke Mother   . Stroke Father   . Heart attack Father   . Cancer Brother      Current Outpatient Medications:  .  acetaminophen (TYLENOL) 325 MG tablet, Take 2 tablets (650 mg total) by mouth every 4 (four) hours as needed for headache or mild pain., Disp:  , Rfl:  .  aspirin EC 81 MG tablet, Take 81 mg by mouth daily., Disp: , Rfl:  .  cetirizine (ZYRTEC) 10 MG tablet, Take 10 mg by mouth as needed for allergies., Disp: , Rfl:  .  Cholecalciferol (VITAMIN D) 50 MCG (2000 UT) tablet, Take 2,000 Units by mouth daily., Disp: , Rfl:  .  clopidogrel (PLAVIX) 75 MG tablet, TAKE 1 TABLET BY MOUTH DAILY, Disp: 90 tablet, Rfl: 1 .  diclofenac sodium (VOLTAREN) 1 % GEL, Apply 2 g topically 4 (four) times daily. Rub into affected area of foot 2 to 4 times daily, Disp: 100 g, Rfl: 2 .  polyethylene glycol (MIRALAX / GLYCOLAX) 17 g packet, Take 17 g by mouth daily. (Patient not taking: Reported on 02/18/2020), Disp: 14 each, Rfl: 0 .  psyllium (METAMUCIL) 58.6 % packet, Take 1 packet by mouth daily., Disp: , Rfl:  .  rosuvastatin (CRESTOR) 20 MG tablet, Take 1 tablet (20 mg total) by mouth daily., Disp: 30 tablet, Rfl: 3 .  traMADol (ULTRAM) 50 MG tablet, Take 1 tablet (50 mg total) by mouth every 6 (six)  hours as needed for moderate pain., Disp: 30 tablet, Rfl: 0 .  zinc gluconate 50 MG tablet, Take 50 mg by mouth daily., Disp: , Rfl:    No Known Allergies   Review of Systems  Constitutional: Negative.   Eyes: Negative for blurred vision.  Respiratory: Negative for apnea, cough, shortness of breath and wheezing.   Cardiovascular: Negative.  Negative for chest pain, palpitations and leg swelling.  Genitourinary: Positive for frequency.  Musculoskeletal: Positive for gait problem.       Right hemiparesis   Neurological: Negative for dizziness and headaches.  Psychiatric/Behavioral: Positive for sleep disturbance (not sleeping as well since his heart surgery.). Negative for depression. The patient has insomnia.      There were no vitals filed for this visit. There is no height or weight on file to calculate BMI.   Objective:  Physical Exam Constitutional:      General: He is not in acute distress.    Appearance: Normal appearance.  Cardiovascular:     Rate and Rhythm: Normal rate and regular rhythm.     Pulses: Normal pulses.     Heart sounds: Normal heart sounds. No murmur heard.   Pulmonary:     Effort: Pulmonary effort is normal. No respiratory distress.     Breath sounds: Normal breath sounds.  Musculoskeletal:     Comments: Wearing brace to right lower extremity and right arm hemiparesis  Skin:    Capillary Refill: Capillary refill takes less than 2 seconds.  Neurological:     General: No focal deficit present.     Mental Status: He is alert and oriented to person, place, and time.     Cranial Nerves: No cranial nerve deficit.  Psychiatric:        Mood and Affect: Mood normal.        Behavior: Behavior normal.        Thought Content: Thought content normal.        Judgment: Judgment normal.         Assessment And Plan:     Problem List Items Addressed This Visit      Cardiovascular and Mediastinum   Essential hypertension - Primary    . B/P is controlled.   . CMP ordered to check renal function.  . The importance of regular exercise and dietary modification to include low salt diet was stressed to the patient.  . Stressed importance of losing ten percent of her body weight to help with B/P control.  . The weight loss would help with decreasing cardiac and cancer risk as well.        Relevant Orders   BMP8+eGFR     Other   Prediabetes     Chronic,  stable  Encouraged to avoid sugary foods and drinks  No current medications      Relevant Orders   Hemoglobin A1c    Other Visit Diagnoses    Counseling regarding advanced directives and goals of care       Placed order for palliative care to visit for goals of care and advanced directives to discuss code status.    Relevant Orders   Ambulatory referral to Hospice        Patient was given opportunity to ask questions. Patient verbalized understanding of the plan and was able to repeat key elements of the plan. All questions were answered to their satisfaction.  [name], FNP   I, [name], FNP, have reviewed all documentation for this visit. The documentation on 02/18/20 for the exam, diagnosis, procedures, and orders are all accurate and complete.  THE PATIENT IS ENCOURAGED TO PRACTICE SOCIAL DISTANCING DUE TO THE COVID-19 PANDEMIC.

## 2020-02-23 NOTE — Progress Notes (Signed)
CARDIOLOGY OFFICE NOTE  Date:  02/29/2020    [name] Date of Birth: 05-24-1936 Medical Record #[id number]  PCP:  [name], FNP  Cardiologist:  Katrinka Blazing    Chief Complaint  Patient presents with  . Follow-up    Seen for Dr. Katrinka Blazing    History of Present Illness: [name] is a 84 y.o. male who presents today for a follow up visit. Seen for Dr. Katrinka Blazing.   He has a history of HTN, HLD, prior CVA, PAD, prediabetes, aortic atherosclerosis, RBBB and bradycardia. He followswith Dr. Myra Gianotti for his PAD. ABIs and duplex showed significant findings including right iliac occlusion and left superficial femoral and external iliac artery stenosis for which he was on both aspirin and plavix.  Last seen by Dr. Katrinka Blazing in March when referred for his PAD and bradycardia/RBBB.   Was seen by Vernona Rieger in early May following hospitalization in April with chest pain. Had been admitted in April after a fall - troponin 420 in the setting of elevated CK and suspected demand ischemia. HR in the 50's. No fractures at that time. He was cathed in April with significant disease noted that led to CABG with LIMA to LAD, SVG to RCA and SVG to DX with removal of LAA clot and clipping - had post op AF - started on Amiodarone and was on short term. Discharged to SNF.   Last seen in May by Vernona Rieger for his post hospital check - not much appetite. No beta blocker given due to soft BP and I suspect prior bradycardia. His niece is an Charity fundraiser who helps him.   Comes in today. Here with his niece who is an Charity fundraiser at [employer]. Echo updated earlier this month - EF has returned to normal but there is now the issue of significant pulmonary HTN. His niece augments the history and basically answers all questions for him. He is very hard of hearing. He was apparently totally independent about 2 years ago. She moved him closer to her when COVID came. Did fine until he fell - had the MI and then surgery. No wife and no children. Back  to getting his independence and she feels like he is back to his baseline. PT is over. Home health is over. Some rare wheezing but not really short of breath. He snores a lot. Probably has some degree of OSA - discussed previously with PCP - talk of sleep study but opted to wait.  Apparently had spells of where he would stop breathing at night - this has stopped. She would now like to get a sleep study due to the recent echo.   Past Medical History:  Diagnosis Date  . Arthritis   . Bundle branch block   . Hypercholesteremia   . Hyperlipidemia 2001  . Hypertension 2001  . Hypertension   . Peripheral vascular disease (HCC)   . Stroke (HCC)   . Stroke [facility])    2001 - R side deficits    Past Surgical History:  Procedure Laterality Date  . ABDOMINAL AORTOGRAM N/A 11/18/2019   Procedure: ABDOMINAL AORTOGRAM;  Surgeon: Corky Crafts, MD;  Location: [facility] INVASIVE CV LAB;  Service: Cardiovascular;  Laterality: N/A;  . CORONARY ARTERY BYPASS GRAFT N/A 11/20/2019   Procedure: CORONARY ARTERY BYPASS GRAFTING (CABG), ON PUMP, TIMES THREE, USING LEFT INTERNAL MAMMARY ARTERY AND ENDOSCOPICALLY HARVESTED RIGHT GREATER SAPHENOUS VEIN;  Surgeon: Linden Dolin, MD;  Location: [facility] OR;  Service: Open Heart Surgery;  Laterality: N/A;  . LEFT HEART CATH AND CORONARY ANGIOGRAPHY N/A 11/18/2019   Procedure: LEFT HEART CATH AND CORONARY ANGIOGRAPHY;  Surgeon: Corky Crafts, MD;  Location: [facility] INVASIVE CV LAB;  Service: Cardiovascular;  Laterality: N/A;  . TEE WITHOUT CARDIOVERSION N/A 11/20/2019   Procedure: TRANSESOPHAGEAL ECHOCARDIOGRAM (TEE);  Surgeon: Linden Dolin, MD;  Location: [facility] OR;  Service: Open Heart Surgery;  Laterality: N/A;     Medications: Current Meds  Medication Sig  . acetaminophen (TYLENOL) 325 MG tablet Take 2 tablets (650 mg total) by mouth every 4 (four) hours as needed for headache or mild pain.  [name] aspirin EC 81 MG tablet Take 81 mg by mouth daily.  . cetirizine (ZYRTEC)  10 MG tablet Take 10 mg by mouth as needed for allergies.  . Cholecalciferol (VITAMIN D) 50 MCG (2000 UT) tablet Take 2,000 Units by mouth daily.  . clopidogrel (PLAVIX) 75 MG tablet TAKE 1 TABLET BY MOUTH DAILY  . diclofenac sodium (VOLTAREN) 1 % GEL Apply 2 g topically 4 (four) times daily. Rub into affected area of foot 2 to 4 times daily  . polyethylene glycol (MIRALAX / GLYCOLAX) 17 g packet Take 17 g by mouth daily.  . psyllium (METAMUCIL) 58.6 % packet Take 1 packet by mouth daily.  . rosuvastatin (CRESTOR) 20 MG tablet Take 1 tablet (20 mg total) by mouth daily.  [name] sulfamethoxazole-trimethoprim (BACTRIM DS) 800-160 MG tablet Take 1 tablet by mouth 2 (two) times daily.  . traMADol (ULTRAM) 50 MG tablet Take 1 tablet (50 mg total) by mouth every 6 (six) hours as needed for moderate pain.  [name] zinc gluconate 50 MG tablet Take 50 mg by mouth daily.     Allergies: No Known Allergies  Social History: The patient  reports that he has never smoked. He has never used smokeless tobacco. He reports that he does not drink alcohol and does not use drugs.   Family History: The patient's family history includes Cancer in his brother; Heart attack in his father; Stroke in his father and mother.   Review of Systems: Please see the history of present illness.   All other systems are reviewed and negative.   Physical Exam: VS:  BP (!) 138/66   Pulse 66   Wt 161 lb 9.6 oz (73.3 kg)   SpO2 97%   BMI 21.92 kg/m  .  BMI Body mass index is 21.92 kg/m.  Wt Readings from Last 3 Encounters:  02/29/20 161 lb 9.6 oz (73.3 kg)  02/18/20 161 lb (73 kg)  02/18/20 161 lb (73 kg)    General: Alert and in no acute distress. Using a cane.  Very hard of hearing.  Cardiac: Regular rate and rhythm. No murmurs, rubs, or gallops. No edema.  Respiratory:  Lungs are clear to auscultation bilaterally with normal work of breathing.  GI: Soft and nontender.  MS: No deformity or atrophy. Gait and ROM intact. He  has a brace on the right lower leg due to prior stroke.  Skin: Warm and dry. Color is normal.  Neuro:  Strength and sensation are intact and no gross focal deficits noted.  Psych: Alert, appropriate and with normal affect.   LABORATORY DATA:  EKG:  EKG is not ordered today.    Lab Results  Component Value Date   WBC 9.4 11/28/2019   HGB 12.1 (L) 11/28/2019   HCT 37.3 (L) 11/28/2019   PLT 227 11/28/2019   GLUCOSE 91 12/10/2019   CHOL 108 11/18/2019  TRIG 44 11/18/2019   HDL 43 11/18/2019   LDLCALC 56 11/18/2019   ALT 27 12/10/2019   AST 24 12/10/2019   NA 135 12/10/2019   K 4.0 12/10/2019   CL 101 12/10/2019   CREATININE 0.92 12/10/2019   BUN 14 12/10/2019   CO2 21 12/10/2019   TSH 4.160 12/10/2019   INR 1.6 (H) 11/20/2019   HGBA1C 6.0 (H) 06/30/2019   MICROALBUR 80 02/18/2020     BNP (last 3 results) Recent Labs    11/03/19 0846 11/17/19 1444  BNP 38.5 2,124.2*    ProBNP (last 3 results) No results for input(s): PROBNP in the last 8760 hours.   Other Studies Reviewed Today:  ECHO IMPRESSIONS 02/2020  1. Left ventricular ejection fraction, by estimation, is 50 to 55%. The  left ventricle has low normal function. There is moderate asymmetric left  ventricular hypertrophy of the basal-septal segment. Left ventricular  diastolic parameters are consistent  with Grade III diastolic dysfunction (restrictive). Elevated left atrial  pressure.  2. Right ventricular systolic function is mildly reduced. The right  ventricular size is mildly enlarged. There is severely elevated pulmonary  artery systolic pressure. The estimated right ventricular systolic  pressure is 71.7 mmHg.  3. Right atrial size was moderately dilated.  4. The mitral valve is normal in structure. Moderate mitral valve  regurgitation.  5. Tricuspid valve regurgitation is mild to moderate.  6. The aortic valve was not well visualized. Aortic valve regurgitation  is not visualized. No  aortic stenosis is present.  7. The inferior vena cava is normal in size with <50% respiratory  variability, suggesting right atrial pressure of 8 mmHg.   Comparison(s): 11/19/19 EF 35-40%.      Coronary Artery Bypass Grafting x3 11/2019 Left Internal Mammary Artery to Distal Left Anterior Descending Coronary Artery;Saphenous Vein Graft todistal right coronaryArtery;Saphenous Vein Graft to Diagonal Branch Coronary Artery;Endoscopic Vein Harvest fromright thigh and Lower Leg   Significant Diagnostic Studies:angiography:   Ost RCA to Prox RCA lesion is 70% stenosed.  Mid RCA lesion is 75% stenosed.  Ost Cx to Prox Cx lesion is 100% stenosed.  Ost LM to Mid LM lesion is 75% stenosed.  Dist LM to Prox LAD lesion is 90% stenosed.  Mid LAD lesion is 25% stenosed.  There is moderate left ventricular systolic dysfunction.  LV end diastolic pressure is moderately elevated.  The left ventricular ejection fraction is 35-45% by visual estimate.  There is no aortic valve stenosis.  Severe three vessel disease including heavily calcified left main and proximal LAD.   Echo 11/19/19 1. Since the last study on 11/03/2019 LVEF has decreased from 60-65% to  35-40% with global hypokinesis and akinesis in the basal and mid inferior  and inferolateral walls. There is heavy smoke in the left ventricular apex  consistent with pre-thrombotic  state. A systemic anticoagulation should be considered.  2. Left ventricular ejection fraction, by estimation, is 35 to 40%. The  left ventricle has moderately decreased function. The left ventricle  demonstrates regional wall motion abnormalities (see scoring  diagram/findings for description). There is mild  concentric left ventricular hypertrophy. Left ventricular diastolic  parameters are consistent with Grade II diastolic dysfunction  (pseudonormalization). Elevated left atrial pressure.  3. Right ventricular systolic  function is normal. The right ventricular  size is normal.  4. Left atrial size was mildly dilated.  5. Large pleural effusion in the left lateral region.  6. The mitral valve is normal in structure. Moderate mitral  valve  regurgitation. No evidence of mitral stenosis.  7. The aortic valve is normal in structure. Aortic valve regurgitation is  not visualized. Mild to moderate aortic valve sclerosis/calcification is  present, without any evidence of aortic stenosis.  8. The inferior vena cava is normal in size with greater than 50%  respiratory variability, suggesting right atrial pressure of 3 mmHg.    Pre cabg dopplers Right Carotid: Velocities in the right ICA are consistent with a 1-39%  stenosis.   Left Carotid: Velocities in the left ICA are consistent with a 1-39%  stenosis.  Vertebrals: Bilateral vertebral arteries demonstrate antegrade flow.   Right ABI: Resting right ankle-brachial index indicates moderate right  lower extremity arterial disease.  Left ABI: Resting left ankle-brachial index is within normal range. No  evidence of significant left lower extremity arterial disease.  Left Upper Extremity: Doppler waveform obliterate with left radial  compression. Doppler waveform obliterate with left ulnar compression.     Intraoperative echo TEE Left Ventricle: has mildly reduced systolic function compared to  pre-bypass.  - Aorta: The aorta appears unchanged from pre-bypass.  - Aortic Valve: There is no regurgitation.  - Mitral Valve: The mitral valve appears unchanged from pre-bypass.  Moderate  regurgitation.  - Tricuspid Valve: The tricuspid valve appears unchanged from pre-bypass.  - Interatrial Septum: The interatrial septum appears unchanged from  pre-bypass.  - Pericardium: The pericardium appears unchanged from pre-bypass.  - Comments: Mitral valve clot not visualized.   ASSESSMENT AND PLAN:  1. Prior NSTEMI with CAD and subsequent CABG with  LIMA to LAD, SVG to RCA and SVG to DX along with removal of left atrial clipping and clot removal from April - had slow but stable progress - doing well. Seems to be back at his baseline. Remains on aspirin and Plavix.   2. Prior PAF - in sinus by exam. No longer on amiodarone.   3. Prior LV dysfunction/ICM - this has improved.   4. HLD - on statin  5. New pulmonary HTN - unclear etiology - discussed with Dr. Katrinka Blazing - will arrange for sleep study and VQ scan. Wonder if something happened intraoperatively/post op?? His oxygen sat is 97% - HR is in the 60's. I have left [name] a message to call us back so we can update her as to our plan of care.   6. Prior stroke - on DAPT  Current medicines are reviewed with the patient today.  The patient does not have concerns regarding medicines other than what has been noted above.  The following changes have been made:  See above.  Labs/ tests ordered today include:   No orders of the defined types were placed in this encounter.    Disposition:   FU with [place] in about 3 months. Overall he looks to be doing well. Will get lab on return.    Patient is agreeable to this plan and will call if any problems develop in the interim.   SignedNorma Fredrickson, NP  02/29/2020 4:53 PM  [facility] [address] [place], [place]  [address] Phone: [phone] Fax: [phone]

## 2020-02-24 LAB — URINE CULTURE

## 2020-02-29 ENCOUNTER — Ambulatory Visit: Payer: Medicare PPO | Admitting: Nurse Practitioner

## 2020-02-29 ENCOUNTER — Telehealth: Payer: Self-pay | Admitting: *Deleted

## 2020-02-29 ENCOUNTER — Other Ambulatory Visit: Payer: Self-pay | Admitting: *Deleted

## 2020-02-29 ENCOUNTER — Telehealth: Payer: Self-pay | Admitting: Hospice

## 2020-02-29 ENCOUNTER — Encounter: Payer: Self-pay | Admitting: Nurse Practitioner

## 2020-02-29 ENCOUNTER — Other Ambulatory Visit: Payer: Self-pay

## 2020-02-29 VITALS — BP 138/66 | HR 66 | Wt 161.6 lb

## 2020-02-29 DIAGNOSIS — E782 Mixed hyperlipidemia: Secondary | ICD-10-CM | POA: Diagnosis not present

## 2020-02-29 DIAGNOSIS — I272 Pulmonary hypertension, unspecified: Secondary | ICD-10-CM

## 2020-02-29 DIAGNOSIS — I255 Ischemic cardiomyopathy: Secondary | ICD-10-CM

## 2020-02-29 DIAGNOSIS — Z951 Presence of aortocoronary bypass graft: Secondary | ICD-10-CM

## 2020-02-29 DIAGNOSIS — I214 Non-ST elevation (NSTEMI) myocardial infarction: Secondary | ICD-10-CM | POA: Diagnosis not present

## 2020-02-29 DIAGNOSIS — R0683 Snoring: Secondary | ICD-10-CM

## 2020-02-29 NOTE — Telephone Encounter (Signed)
Epworth sleepiness scale  Total=6

## 2020-02-29 NOTE — Telephone Encounter (Signed)
-----   Message from [name] sent at 02/29/2020  2:28 PM EDT ----- Please call pt and schedule a sleep study for snoring   [name] [id number]  Thanks, [name]

## 2020-02-29 NOTE — Telephone Encounter (Signed)
Called patient's niece, Weldon Picking, to schedule a Palliative Consult, no answer - left message with reason for call along with my name and contact number, requesting a return call

## 2020-02-29 NOTE — Patient Instructions (Addendum)
After Visit Summary:  We will be checking the following labs today - NONE  Medication Instructions:    Continue with your current medicines.    If you need a refill on your cardiac medications before your next appointment, please call your pharmacy.     Testing/Procedures To Be Arranged:  Sleep study  Follow-Up:   See Dr. Katrinka Blazing in 3 months    At [facility], you and your health needs are our priority.  As part of our continuing mission to provide you with exceptional heart care, we have created designated Provider Care Teams.  These Care Teams include your primary Cardiologist (physician) and Advanced Practice Providers (APPs -  Physician Assistants and Nurse Practitioners) who all work together to provide you with the care you need, when you need it.  Special Instructions:  . Stay safe, wash your hands for at least 20 seconds and wear a mask when needed.  . It was good to talk with you today.    Call the [facility] office at [phone] if you have any questions, problems or concerns.

## 2020-03-01 ENCOUNTER — Other Ambulatory Visit: Payer: Self-pay | Admitting: Nurse Practitioner

## 2020-03-01 DIAGNOSIS — I272 Pulmonary hypertension, unspecified: Secondary | ICD-10-CM

## 2020-03-04 ENCOUNTER — Ambulatory Visit (HOSPITAL_COMMUNITY)
Admission: RE | Admit: 2020-03-04 | Discharge: 2020-03-04 | Disposition: A | Discharge status: Home / Self Care | Payer: Medicare PPO | Source: Ambulatory Visit | Attending: Nurse Practitioner | Admitting: Nurse Practitioner

## 2020-03-04 ENCOUNTER — Other Ambulatory Visit: Payer: Self-pay

## 2020-03-04 ENCOUNTER — Other Ambulatory Visit: Payer: Self-pay | Admitting: Nurse Practitioner

## 2020-03-04 DIAGNOSIS — I272 Pulmonary hypertension, unspecified: Secondary | ICD-10-CM

## 2020-03-04 MED ORDER — TECHNETIUM TO 99M ALBUMIN AGGREGATED
4.2000 | Freq: Once | INTRAVENOUS | Status: AC | PRN
  Administered 2020-03-04: 4.2 via INTRAVENOUS

## 2020-03-07 ENCOUNTER — Telehealth: Payer: Self-pay

## 2020-03-07 NOTE — Telephone Encounter (Signed)
°  Chronic Care Management   Outreach Note  03/07/2020 Name: FROILAN MCLEAN MRN: [id number] DOB: [birth year]  Referred by: [name], FNP Reason for referral : No chief complaint on file.   A second unsuccessful telephone outreach was attempted today. The patient was referred to the case management team for assistance with care management and care coordination.   Follow Up Plan: A HIPPA compliant phone message was left for the patient providing contact information and requesting a return call.  Telephone follow up appointment with care management team member scheduled for: 04/08/20  Delsa Sale, RN, BSN, CCM Care Management Coordinator [facility] Care Management/[facility]  Direct Phone: [phone]

## 2020-03-08 ENCOUNTER — Telehealth: Payer: Self-pay | Admitting: Nurse Practitioner

## 2020-03-08 ENCOUNTER — Encounter (HOSPITAL_COMMUNITY): Payer: Self-pay

## 2020-03-08 ENCOUNTER — Other Ambulatory Visit: Payer: Self-pay

## 2020-03-08 ENCOUNTER — Telehealth (HOSPITAL_COMMUNITY): Payer: Self-pay

## 2020-03-08 DIAGNOSIS — I272 Pulmonary hypertension, unspecified: Secondary | ICD-10-CM

## 2020-03-08 DIAGNOSIS — R0683 Snoring: Secondary | ICD-10-CM

## 2020-03-08 DIAGNOSIS — N390 Urinary tract infection, site not specified: Secondary | ICD-10-CM

## 2020-03-08 NOTE — Telephone Encounter (Signed)
Spoke with [name] the pts niece RE: the pts VQ scan and Chest Xray and she verbalized understanding and agrees to proceed with the Sleep study.

## 2020-03-08 NOTE — Telephone Encounter (Signed)
Attempted to call patient in regards to Cardiac Rehab - LM on VM Mailed letter 

## 2020-03-08 NOTE — Telephone Encounter (Signed)
[name], patient's niece, is calling to get the patients VQ scan results. Please call.

## 2020-03-09 LAB — BMP8+EGFR
BUN/Creatinine Ratio: 12 (ref 10–24)
BUN: 15 mg/dL (ref 8–27)
CO2: 19 mmol/L — ABNORMAL LOW (ref 20–29)
Calcium: 9.5 mg/dL (ref 8.6–10.2)
Chloride: 106 mmol/L (ref 96–106)
Creatinine, Ser: 1.26 mg/dL (ref 0.76–1.27)
GFR calc Af Amer: 61 mL/min/{1.73_m2} (ref >59)
GFR calc non Af Amer: 53 mL/min/{1.73_m2} — ABNORMAL LOW (ref >59)
Glucose: 86 mg/dL (ref 65–99)
Potassium: 4.2 mmol/L (ref 3.5–5.2)
Sodium: 139 mmol/L (ref 134–144)

## 2020-03-09 LAB — URINE CULTURE

## 2020-03-09 LAB — HEMOGLOBIN A1C
Est. average glucose Bld gHb Est-mCnc: 120 mg/dL
Hgb A1c MFr Bld: 5.8 % — ABNORMAL HIGH (ref 4.8–5.6)

## 2020-03-09 NOTE — Telephone Encounter (Signed)
Pt is agreeable to proceeding with sleep study. Orders are in. Will send to precert

## 2020-03-16 ENCOUNTER — Telehealth: Payer: Self-pay | Admitting: *Deleted

## 2020-03-16 NOTE — Telephone Encounter (Signed)
PA for sleep study submitted to [facility] via web portal on 03/14/20.

## 2020-03-21 ENCOUNTER — Telehealth: Payer: Self-pay | Admitting: *Deleted

## 2020-03-21 NOTE — Telephone Encounter (Signed)
Staff message sent to [facility] ok to schedule sleep study. [employer] Auth received. Berkley Harvey #[id number]. Valid dates 03/28/20 to 04/27/20.

## 2020-03-22 NOTE — Telephone Encounter (Addendum)
Patient is scheduled for lab study on 04/24/20. Patient understands his sleep study will be done at [facility] sleep lab. Patient understands he will receive a sleep packet in a week or so. Patient understands to call if he does not receive the sleep packet in a timely manner. Patient agrees with treatment and thanked me for call. Left detailed message PER DPR with [name] with date and time of SS.

## 2020-04-07 ENCOUNTER — Telehealth: Payer: Self-pay | Admitting: Hospice

## 2020-04-07 ENCOUNTER — Telehealth (HOSPITAL_COMMUNITY): Payer: Self-pay

## 2020-04-07 NOTE — Telephone Encounter (Signed)
Called patient's niece, Weldon Picking, to schedule the Palliative Consult, no answer - left message with reason for call along with my name and call back number.  I also called patient's other niece, [name], with no answer - message left as well.

## 2020-04-08 ENCOUNTER — Telehealth (HOSPITAL_COMMUNITY): Payer: Self-pay | Admitting: Nurse Practitioner

## 2020-04-08 ENCOUNTER — Telehealth: Payer: Self-pay

## 2020-04-08 NOTE — Telephone Encounter (Cosign Needed)
  Chronic Care Management   Outreach Note  04/08/2020 Name: [name] MRN: [id number] DOB: [birth year]  Referred by: [name], FNP Reason for referral : Chronic Care Management (#3 FU RN CM Call )   Third unsuccessful telephone outreach was attempted today. The patient was referred to the case management team for assistance with care management and care coordination. The patient's primary care provider has been notified of our unsuccessful attempts to make or maintain contact with the patient. The care management team is pleased to engage with this patient at any time in the future should he/she be interested in assistance from the care management team.   Follow Up Plan: We have been unable to make contact with the patient for follow up. The care management team is available to follow up with the patient after provider conversation with the patient regarding recommendation for care management engagement and subsequent re-referral to the care management team.   Delsa Sale, RN, BSN, CCM Care Management Coordinator [facility] Care Management/[facility]  Direct Phone: [phone]

## 2020-04-13 ENCOUNTER — Telehealth (HOSPITAL_COMMUNITY): Payer: Self-pay

## 2020-04-13 NOTE — Telephone Encounter (Signed)
Patient is scheduled for lab study on 04/24/20. Patient understands his sleep study will be done at [facility] sleep lab.

## 2020-04-13 NOTE — Telephone Encounter (Signed)
Pt niece [name] called and stated pt would have to hold off on CR, due to her schedule (she will be the one bringing him.) And once she figure out her schedule she will call back to reschedule.  Closed referral

## 2020-04-15 ENCOUNTER — Other Ambulatory Visit: Payer: Self-pay

## 2020-04-15 ENCOUNTER — Telehealth: Payer: Medicare PPO

## 2020-04-15 ENCOUNTER — Ambulatory Visit: Payer: Self-pay

## 2020-04-15 DIAGNOSIS — I639 Cerebral infarction, unspecified: Secondary | ICD-10-CM

## 2020-04-15 DIAGNOSIS — R7303 Prediabetes: Secondary | ICD-10-CM

## 2020-04-15 DIAGNOSIS — I10 Essential (primary) hypertension: Secondary | ICD-10-CM

## 2020-04-15 NOTE — Chronic Care Management (AMB) (Signed)
Chronic Care Management   Follow Up Note   04/15/2020 Name: [name] MRN: [id number] DOB: [birth year]  Referred by: [name], FNP Reason for referral : Chronic Care Management (CCM Case Closure)   [name] is a 84 y.o. year old male who is a primary care patient of [name], FNP. The CCM team was consulted for assistance with chronic disease management and care coordination needs.    Review of patient status, including review of consultants reports, relevant laboratory and other test results, and collaboration with appropriate care team members and the patient's provider was performed as part of comprehensive patient evaluation and provision of chronic care management services.    SDOH (Social Determinants of Health) assessments performed: No See Care Plan activities for detailed interventions related to SDOH)   Unable to reach patient/caregiver after multiple attempts to f/u with patient to assess for CCM needs, patient will be closed from CCM at this time.    Outpatient Encounter Medications as of 04/15/2020  Medication Sig  . acetaminophen (TYLENOL) 325 MG tablet Take 2 tablets (650 mg total) by mouth every 4 (four) hours as needed for headache or mild pain.  [name] aspirin EC 81 MG tablet Take 81 mg by mouth daily.  . cetirizine (ZYRTEC) 10 MG tablet Take 10 mg by mouth as needed for allergies.  . Cholecalciferol (VITAMIN D) 50 MCG (2000 UT) tablet Take 2,000 Units by mouth daily.  . clopidogrel (PLAVIX) 75 MG tablet TAKE 1 TABLET BY MOUTH DAILY  . diclofenac sodium (VOLTAREN) 1 % GEL Apply 2 g topically 4 (four) times daily. Rub into affected area of foot 2 to 4 times daily  . polyethylene glycol (MIRALAX / GLYCOLAX) 17 g packet Take 17 g by mouth daily.  . psyllium (METAMUCIL) 58.6 % packet Take 1 packet by mouth daily.  . rosuvastatin (CRESTOR) 20 MG tablet Take 1 tablet (20 mg total) by mouth daily.  [name] sulfamethoxazole-trimethoprim (BACTRIM DS) 800-160 MG  tablet Take 1 tablet by mouth 2 (two) times daily.  . traMADol (ULTRAM) 50 MG tablet Take 1 tablet (50 mg total) by mouth every 6 (six) hours as needed for moderate pain.  [name] zinc gluconate 50 MG tablet Take 50 mg by mouth daily.   No facility-administered encounter medications on file as of 04/15/2020.     Objective:  Lab Results  Component Value Date   HGBA1C 5.8 (H) 03/08/2020   HGBA1C 6.0 (H) 06/30/2019   HGBA1C 6.0 (H) 02/17/2019   Lab Results  Component Value Date   MICROALBUR 80 02/18/2020   LDLCALC 56 11/18/2019   CREATININE 1.26 03/08/2020   BP Readings from Last 3 Encounters:  02/29/20 (!) 138/66  02/18/20 118/60  02/18/20 118/60    Goals Addressed      Patient Stated   .  COMPLETED: "We could use some help with medication management" (pt-stated)        CARE PLAN ENTRY (see longitudinal plan of care for additional care plan information)  Current Barriers:  [name] Knowledge Deficits related to best options for medication management of Polypharmacy . Chronic Disease Management support and education needs related to HTN, CVA, prediabetes   Nurse Case Manager Clinical Goal(s):  [name] Over the next 30 days, patient will work with embedded Pharm D to address needs related to medication management and adherence   CCM RN CM Interventions:  . Inter-disciplinary care team collaboration (see longitudinal plan of care) . Reviewed medications with patient and discussed patient had  1 medication change post d/c; Determined his HCTZ was discontinued; Determined patient is self administering his medications with the help from his niece [name], she is filling individual medication cups and the patient is able to Self administer his own medications; Determined niece is concerned about using this system and would like for a Pharmacist to help with medication management with Polypharmacy needs . Discussed plans with patient for ongoing care management follow up and provided patient with direct  contact information for care management team . Pharmacy referral for assistance with Medication management   04/15/20 Unable to reach patient/caregiver after multiple attempts to f/u with patient to assess for CCM needs, patient will be closed from CCM at this time.  11/25/19 CCM Case Collaboration  . Received update from embedded Pharm D Haynes Hoehn advising she received and reviewed the pharmacy referral sent, she noted the patient was admitted to [facility] on 11/17/19 with dx: Nstemi (non-ST elevated myocardial infarction) with planned transition to SNF upon discharge . Discussed the pharmacy referral will be closed at this time   Patient Self Care Activities:  . Self administers medications as prescribed (niece [name] fills medication cups) . Attends all scheduled provider appointments . Performs ADL's independently . Performs IADL's independently  Please see past updates related to this goal by clicking on the "Past Updates" button in the selected goal        Other   .  COMPLETED: Post discharge follow up        CARE PLAN ENTRY (see longitudinal plan of care for additional care plan information)  Current Barriers:  [name] Knowledge Deficits related to home safety and fall prevention  . Chronic Disease Management support and education needs related to HTN, CVA, prediabetes  Nurse Case Manager Clinical Goal(s):  [name] Over the next 30 days, patient will work with in home PT/OT for strengthening and balance and will adhere to his recommended HEP plan.  . Over the next 90 days, patient will work with CCM RN CM and PCP to address needs related to disease education and support to improve Self Health management of chronic conditions . Over the next 90 days, patient will experience having no falls or injury's related to falls   CCM RN CM Interventions:  12/11/19 inbound call completed with niece Weldon Picking . Inter-disciplinary care team collaboration (see longitudinal plan of  care) . Determined patient was readmitted to [facility] on 11/17/19-12/06/19, dx: NSTEMI (non-ST elevated myocardial infarction) and is s/p CABG . Discussed Mr. Henegar is recovering very well and was transferred to [facility] for rehabilitation . Determined the planned d/c is expected to occur on 12/18/19 and Mr. Reidinger will return home with ongoing care assistance to be received by niece [name] and other family members  . Discussed [name] will contact the CCM team once Mr. Salzman is d/c home to review and determine services needed . Discussed plans with patient for ongoing care management follow up and provided patient with direct contact information for care management team  04/15/20 Unable to reach patient/caregiver after multiple attempts to f/u with patient to assess for CCM needs, patient will be closed from CCM at this time.  Patient Self Care Activities:  . Self administers medications as prescribed (niece [name] fills medication cups) . Attends all scheduled provider appointments . Performs ADL's independently . Performs IADL's independently  Please see past updates related to this goal by clicking on the "Past Updates" button in the selected goal  Plan:   We have been unable to make contact with the patient for follow up. The care management team is available to follow up with the patient after provider conversation with the patient regarding recommendation for care management engagement and subsequent re-referral to the care management team.   Delsa Sale, RN, BSN, CCM Care Management Coordinator [facility] Care Management/[facility]  Direct Phone: [phone]

## 2020-04-19 ENCOUNTER — Ambulatory Visit (HOSPITAL_COMMUNITY): Payer: Medicare PPO

## 2020-04-24 ENCOUNTER — Ambulatory Visit (HOSPITAL_BASED_OUTPATIENT_CLINIC_OR_DEPARTMENT_OTHER): Payer: Medicare PPO | Attending: Cardiology | Admitting: Cardiology

## 2020-04-24 DIAGNOSIS — R0683 Snoring: Secondary | ICD-10-CM

## 2020-04-24 DIAGNOSIS — I272 Pulmonary hypertension, unspecified: Secondary | ICD-10-CM | POA: Diagnosis not present

## 2020-04-24 DIAGNOSIS — G4733 Obstructive sleep apnea (adult) (pediatric): Secondary | ICD-10-CM | POA: Diagnosis not present

## 2020-04-24 DIAGNOSIS — G4731 Primary central sleep apnea: Secondary | ICD-10-CM | POA: Insufficient documentation

## 2020-04-25 ENCOUNTER — Ambulatory Visit (HOSPITAL_COMMUNITY): Payer: Medicare PPO

## 2020-04-26 ENCOUNTER — Other Ambulatory Visit: Payer: Self-pay

## 2020-04-26 NOTE — Procedures (Signed)
    Patient Name: [name], [name] Date:04/24/2020 Gender: Male D.O.B: 1936-02-19 Age (years): 43 Referring Provider: Armanda Magic MD, ABSM Height (inches): 71 Interpreting Physician: Armanda Magic MD, ABSM Weight (lbs): 169 RPSGT: Cherylann Parr BMI: 24 MRN: [id number] Neck Size: 16.00 > CLINICAL INFORMATION Sleep Study Type: NPSG  Indication for sleep study: Fatigue, Snoring, Witnesses Apnea / Gasping During Sleep  Epworth Sleepiness Score: 2  SLEEP STUDY TECHNIQUE As per the AASM Manual for the Scoring of Sleep and Associated Events v2.3 (April 2016) with a hypopnea requiring 4% desaturations.  The channels recorded and monitored were frontal, central and occipital EEG, electrooculogram (EOG), submentalis EMG (chin), nasal and oral airflow, thoracic and abdominal wall motion, anterior tibialis EMG, snore microphone, electrocardiogram, and pulse oximetry.  MEDICATIONS Medications self-administered by patient taken the night of the study : N/A  SLEEP ARCHITECTURE The study was initiated at 10:18:49 PM and ended at 4:56:39 AM.  Sleep onset time was 5.7 minutes and the sleep efficiency was 69.5%. The total sleep time was 276.5 minutes.  Stage REM latency was 66.5 minutes.  The patient spent 14.6% of the night in stage N1 sleep, 70.9% in stage N2 sleep, 0.0% in stage N3 and 14.5% in REM.  Alpha intrusion was absent.  Supine sleep was 70.16%.  RESPIRATORY PARAMETERS The overall apnea/hypopnea index (AHI) was 44.7 per hour. There were 198 total apneas, including 104 obstructive, 87 central and 7 mixed apneas. There were 8 hypopneas and 5 RERAs.  The AHI during Stage REM sleep was 19.5 per hour.  AHI while supine was 42.1 per hour.  The mean oxygen saturation was 95.4%. The minimum SpO2 during sleep was 90.0%.  moderate snoring was noted during this study.  CARDIAC DATA The 2 lead EKG demonstrated sinus rhythm. The mean heart rate was 67.2 beats per minute. Other  EKG findings include: PVCs.  LEG MOVEMENT DATA The total PLMS were 0 with a resulting PLMS index of 0.0. Associated arousal with leg movement index was 0.7 .  IMPRESSIONS - Severe obstructive sleep apnea occurred during this study (AHI = 44.7/h). - Moderate central sleep apnea occurred during this study (CAI = 18.9/h). - The patient had minimal or no oxygen desaturation during the study (Min O2 = 90.0%) - The patient snored with moderate snoring volume. - PVCs were noted during this study. - Clinically significant periodic limb movements did not occur during sleep. No significant associated arousals.  DIAGNOSIS - Obstructive Sleep Apnea (G47.33) - Central Sleep Apnea (G47.37)  RECOMMENDATIONS - CPAP titration recommended in lab given complex sleep apnea to determine optimal pressure required to alleviate sleep disordered breathing. BiPAP or ASV titration may be required to eliminate central sleep apnea. - Avoid alcohol, sedatives and other CNS depressants that may worsen sleep apnea and disrupt normal sleep architecture. - Sleep hygiene should be reviewed to assess factors that may improve sleep quality. - Weight management and regular exercise should be initiated or continued if appropriate.  [Electronically signed] 04/26/2020 11:38 AM  Armanda Magic MD, ABSM Diplomate, American Board of Sleep Medicine

## 2020-04-27 ENCOUNTER — Ambulatory Visit (HOSPITAL_COMMUNITY): Payer: Medicare PPO

## 2020-04-28 ENCOUNTER — Telehealth: Payer: Self-pay | Admitting: *Deleted

## 2020-04-28 DIAGNOSIS — G4733 Obstructive sleep apnea (adult) (pediatric): Secondary | ICD-10-CM

## 2020-04-28 NOTE — Telephone Encounter (Signed)
-----   Message from Quintella Reichert, MD sent at 04/26/2020 11:41 AM EDT ----- Please let patient know that they have sleep apnea and recommend CPAP titration. Please set up titration in the sleep lab.

## 2020-04-28 NOTE — Telephone Encounter (Signed)
Informed patient of sleep study results and patient understanding was verbalized. Patient understands his sleep study showed they have sleep apnea and recommend CPAP titration. Please set up titration in the sleep lab.    LMTCB ON VM

## 2020-04-29 ENCOUNTER — Ambulatory Visit (HOSPITAL_COMMUNITY): Payer: Medicare PPO

## 2020-05-02 ENCOUNTER — Ambulatory Visit (HOSPITAL_COMMUNITY): Payer: Medicare PPO

## 2020-05-02 DIAGNOSIS — R3915 Urgency of urination: Secondary | ICD-10-CM | POA: Diagnosis not present

## 2020-05-02 DIAGNOSIS — R8279 Other abnormal findings on microbiological examination of urine: Secondary | ICD-10-CM | POA: Diagnosis not present

## 2020-05-02 DIAGNOSIS — R972 Elevated prostate specific antigen [PSA]: Secondary | ICD-10-CM | POA: Diagnosis not present

## 2020-05-02 DIAGNOSIS — N401 Enlarged prostate with lower urinary tract symptoms: Secondary | ICD-10-CM | POA: Diagnosis not present

## 2020-05-02 DIAGNOSIS — R300 Dysuria: Secondary | ICD-10-CM | POA: Diagnosis not present

## 2020-05-02 NOTE — Telephone Encounter (Signed)
Per dpr [name] called back to discuss patients sleep study results. All questions were answered to her satisfaction and we are awaiting the precert to come back.

## 2020-05-02 NOTE — Telephone Encounter (Signed)
Returned call: Spoke to per dpr [name] and Informed her of patient's sleep study results and her understanding was verbalized. Patient/ [name] understands his sleep study showed they have sleep apnea and recommend CPAP titration.  [name] (cousin) will inform [name] (niece).

## 2020-05-02 NOTE — Telephone Encounter (Signed)
Patients niece returning call.

## 2020-05-04 ENCOUNTER — Ambulatory Visit (HOSPITAL_COMMUNITY): Payer: Medicare PPO

## 2020-05-05 ENCOUNTER — Telehealth: Payer: Self-pay | Admitting: *Deleted

## 2020-05-05 NOTE — Telephone Encounter (Signed)
Staff message sent to [name] ok to schedule CPAP/BIPAP titration study. [employer] Auth received. Auth # [id number]. Valid dates 05/16/20 to 06/15/20

## 2020-05-06 ENCOUNTER — Ambulatory Visit (HOSPITAL_COMMUNITY): Payer: Medicare PPO

## 2020-05-09 ENCOUNTER — Ambulatory Visit (HOSPITAL_COMMUNITY): Payer: Medicare PPO

## 2020-05-11 ENCOUNTER — Ambulatory Visit (HOSPITAL_COMMUNITY): Payer: Medicare PPO

## 2020-05-11 NOTE — Telephone Encounter (Signed)
RE: precert Gaynelle Cage, CMA  Reesa Chew, CMA Ok to schedule. [employer] Auth received. Valid dates 05/16/20 to 06/15/20.          ----- Message -----  From: Reesa Chew, CMA  Sent: 05/02/2020  2:11 PM EDT  To: [facility] Sleep [facility]  Subject: precert                      Cpap titration , BiPAP or ASV titration may be required to eliminate central sleep apnea.

## 2020-05-11 NOTE — Telephone Encounter (Signed)
Patient is scheduled for lab study on 06/05/20. Patient understands his sleep study will be done at [facility] sleep lab. Patient understands he will receive a sleep packet in a week or so. Patient understands to call if he does not receive the sleep packet in a timely manner. Patient agrees with treatment and thanked me for call.

## 2020-05-13 ENCOUNTER — Ambulatory Visit (HOSPITAL_COMMUNITY): Payer: Medicare PPO

## 2020-05-16 ENCOUNTER — Ambulatory Visit (HOSPITAL_COMMUNITY): Payer: Medicare PPO

## 2020-05-18 ENCOUNTER — Ambulatory Visit (HOSPITAL_COMMUNITY): Payer: Medicare PPO

## 2020-05-18 ENCOUNTER — Telehealth: Payer: Self-pay | Admitting: Hospice

## 2020-05-18 NOTE — Telephone Encounter (Signed)
Called patient's niece, Weldon Picking, to schedule the Palliative Consult, no answer.  Left message requesting a return call by tomorrow afternoon to let me know if they wish to pursue Palliative services or not.  If I do not hear back then I will cancel the referral and notify MD.

## 2020-05-19 ENCOUNTER — Telehealth: Payer: Self-pay | Admitting: Hospice

## 2020-05-20 ENCOUNTER — Ambulatory Visit (HOSPITAL_COMMUNITY): Payer: Medicare PPO

## 2020-05-23 ENCOUNTER — Ambulatory Visit (HOSPITAL_COMMUNITY): Payer: Medicare PPO

## 2020-05-25 ENCOUNTER — Telehealth: Payer: Self-pay

## 2020-05-25 ENCOUNTER — Ambulatory Visit (HOSPITAL_COMMUNITY): Payer: Medicare PPO

## 2020-05-25 NOTE — Telephone Encounter (Signed)
Called Gean Quint, NP's office to let them know that I have been unable to schedule the Palliative Consult due to no one will return my calls.  Asked if I should cancel the referral or if there was someone else that I needed to contact.  Receptionist sent message back and she took my name and number and said someone would call me back.

## 2020-05-25 NOTE — Telephone Encounter (Signed)
Patient consented to virtual appointment. YL,RMA 

## 2020-05-27 ENCOUNTER — Ambulatory Visit (HOSPITAL_COMMUNITY): Payer: Medicare PPO

## 2020-05-30 ENCOUNTER — Ambulatory Visit (HOSPITAL_COMMUNITY): Payer: Medicare PPO

## 2020-05-30 ENCOUNTER — Other Ambulatory Visit: Payer: Self-pay

## 2020-05-30 ENCOUNTER — Encounter: Payer: Self-pay | Admitting: Nurse Practitioner

## 2020-05-30 ENCOUNTER — Telehealth (INDEPENDENT_AMBULATORY_CARE_PROVIDER_SITE_OTHER): Payer: Medicare PPO | Admitting: Nurse Practitioner

## 2020-05-30 VITALS — BP 131/60 | HR 62

## 2020-05-30 DIAGNOSIS — J309 Allergic rhinitis, unspecified: Secondary | ICD-10-CM

## 2020-05-30 MED ORDER — FLUTICASONE PROPIONATE 50 MCG/ACT NA SUSP: 2.0000 | Freq: Every day | NASAL | 2 refills | Status: AC

## 2020-05-30 MED ORDER — MONTELUKAST SODIUM 10 MG PO TABS: 10.0000 mg | ORAL_TABLET | Freq: Every day | ORAL | 2 refills | Status: DC

## 2020-05-30 NOTE — Progress Notes (Signed)
Virtual Visit via MyChart   This visit type was conducted due to national recommendations for restrictions regarding the COVID-19 Pandemic (e.g. social distancing) in an effort to limit this patient's exposure and mitigate transmission in our community.  Due to his co-morbid illnesses, this patient is at least at moderate risk for complications without adequate follow up.  This format is felt to be most appropriate for this patient at this time.  All issues noted in this document were discussed and addressed.  A limited physical exam was performed with this format.    This visit type was conducted due to national recommendations for restrictions regarding the COVID-19 Pandemic (e.g. social distancing) in an effort to limit this patient's exposure and mitigate transmission in our community.  Patients identity confirmed using two different identifiers.  This format is felt to be most appropriate for this patient at this time.  All issues noted in this document were discussed and addressed.  No physical exam was performed (except for noted visual exam findings with Video Visits).    Date:  07/13/2020   ID:  [name], DOB December 02, 1935, MRN [id number]  Patient Location:  Home - spoke with his niece and [name]  Provider location:   Office    Chief Complaint:  Allergy symptoms  History of Present Illness:    [name] is a 84 y.o. male who presents via video conferencing for a telehealth visit today.    The patient does have symptoms concerning for COVID-19 infection (fever, chills, cough, or new shortness of breath).   Virtual visit vis mychart with text.  He is taking zyrtec daily 10 mg, he is having constant nose dripping like a faucet.  He has not taken this in past.  He is not doing any nasal sprays.  Denies congestion or cough. His niece feels like he is turning down social engagements due to the nasal dripping.       Past Medical History:  Diagnosis Date  .  Arthritis   . Bundle branch block   . Hypercholesteremia   . Hyperlipidemia 2001  . Hypertension 2001  . Hypertension   . Peripheral vascular disease (HCC)   . Stroke (HCC)   . Stroke [facility])    2001 - R side deficits   Past Surgical History:  Procedure Laterality Date  . ABDOMINAL AORTOGRAM N/A 11/18/2019   Procedure: ABDOMINAL AORTOGRAM;  Surgeon: Corky Crafts, MD;  Location: [facility] INVASIVE CV LAB;  Service: Cardiovascular;  Laterality: N/A;  . CORONARY ARTERY BYPASS GRAFT N/A 11/20/2019   Procedure: CORONARY ARTERY BYPASS GRAFTING (CABG), ON PUMP, TIMES THREE, USING LEFT INTERNAL MAMMARY ARTERY AND ENDOSCOPICALLY HARVESTED RIGHT GREATER SAPHENOUS VEIN;  Surgeon: Linden Dolin, MD;  Location: [facility] OR;  Service: Open Heart Surgery;  Laterality: N/A;  . LEFT HEART CATH AND CORONARY ANGIOGRAPHY N/A 11/18/2019   Procedure: LEFT HEART CATH AND CORONARY ANGIOGRAPHY;  Surgeon: Corky Crafts, MD;  Location: [facility] INVASIVE CV LAB;  Service: Cardiovascular;  Laterality: N/A;  . TEE WITHOUT CARDIOVERSION N/A 11/20/2019   Procedure: TRANSESOPHAGEAL ECHOCARDIOGRAM (TEE);  Surgeon: Linden Dolin, MD;  Location: [facility] OR;  Service: Open Heart Surgery;  Laterality: N/A;     Current Meds  Medication Sig  . acetaminophen (TYLENOL) 325 MG tablet Take 2 tablets (650 mg total) by mouth every 4 (four) hours as needed for headache or mild pain.  [name] aspirin EC 81 MG tablet Take 81 mg by mouth daily.  . cetirizine (  ZYRTEC) 10 MG tablet Take 10 mg by mouth as needed for allergies.  . Cholecalciferol (VITAMIN D) 50 MCG (2000 UT) tablet Take 2,000 Units by mouth daily.  . clopidogrel (PLAVIX) 75 MG tablet TAKE 1 TABLET BY MOUTH DAILY  . diclofenac sodium (VOLTAREN) 1 % GEL Apply 2 g topically 4 (four) times daily. Rub into affected area of foot 2 to 4 times daily  . polyethylene glycol (MIRALAX / GLYCOLAX) 17 g packet Take 17 g by mouth daily.  . traMADol (ULTRAM) 50 MG tablet Take 1 tablet (50 mg total)  by mouth every 6 (six) hours as needed for moderate pain.  [name] zinc gluconate 50 MG tablet Take 50 mg by mouth daily.  . [DISCONTINUED] psyllium (METAMUCIL) 58.6 % packet Take 1 packet by mouth daily. (Patient not taking: Reported on 06/02/2020)  . [DISCONTINUED] rosuvastatin (CRESTOR) 20 MG tablet Take 1 tablet (20 mg total) by mouth daily.     Allergies:   Patient has no known allergies.   Social History   Tobacco Use  . Smoking status: Never Smoker  . Smokeless tobacco: Never Used  Vaping Use  . Vaping Use: Never assessed  Substance Use Topics  . Alcohol use: Never  . Drug use: Never     Family Hx: The patient's family history includes Cancer in his brother; Heart attack in his father; Stroke in his father and mother.  ROS:   Please see the history of present illness.    Review of Systems  Constitutional: Negative.  Negative for chills.  HENT: Negative for congestion, sinus pain and sore throat.        Runny nose is constant.    Respiratory: Negative.  Negative for cough.   Cardiovascular: Negative.  Negative for chest pain and palpitations.  Neurological: Negative for dizziness and headaches.  Psychiatric/Behavioral: Negative.     All other systems reviewed and are negative.   Labs/Other Tests and Data Reviewed:    Recent Labs: 11/17/2019: B Natriuretic Peptide 2,124.2 11/21/2019: Magnesium 2.7 11/28/2019: Hemoglobin 12.1; Platelets 227 12/10/2019: ALT 27; TSH 4.160 03/08/2020: BUN 15; Creatinine, Ser 1.26; Potassium 4.2; Sodium 139   Recent Lipid Panel Lab Results  Component Value Date/Time   CHOL 108 11/18/2019 02:22 AM   CHOL 157 06/30/2019 05:01 PM   TRIG 44 11/18/2019 02:22 AM   HDL 43 11/18/2019 02:22 AM   HDL 61 06/30/2019 05:01 PM   CHOLHDL 2.5 11/18/2019 02:22 AM   LDLCALC 56 11/18/2019 02:22 AM   LDLCALC 84 06/30/2019 05:01 PM    Wt Readings from Last 3 Encounters:  06/02/20 163 lb 6.4 oz (74.1 kg)  04/24/20 169 lb (76.7 kg)  02/29/20 161 lb 9.6 oz  (73.3 kg)     Exam:    Vital Signs:  BP 131/60   Pulse 62     Physical Exam Constitutional:      General: He is not in acute distress.    Appearance: Normal appearance.  Pulmonary:     Effort: Pulmonary effort is normal. No respiratory distress.  Skin:    Capillary Refill: Capillary refill takes less than 2 seconds.  Neurological:     General: No focal deficit present.     Mental Status: He is alert and oriented to person, place, and time.     Cranial Nerves: No cranial nerve deficit.  Psychiatric:        Mood and Affect: Mood normal.        Behavior: Behavior normal.  Thought Content: Thought content normal.        Judgment: Judgment normal.     ASSESSMENT & PLAN:     1. Allergic rhinitis, unspecified seasonality, unspecified trigger Will try him on singulair and flonase     COVID-19 Education: The signs and symptoms of COVID-19 were discussed with the patient and how to seek care for testing (follow up with PCP or arrange E-visit).  The importance of social distancing was discussed today.  Patient Risk:   After full review of this patients clinical status, I feel that they are at least moderate risk at this time.  Time:   Today, I have spent  minutes/ seconds with the patient with telehealth technology discussing above diagnoses.     Medication Adjustments/Labs and Tests Ordered: Current medicines are reviewed at length with the patient today.  Concerns regarding medicines are outlined above.   Tests Ordered: No orders of the defined types were placed in this encounter.   Medication Changes: Meds ordered this encounter  Medications  . montelukast (SINGULAIR) 10 MG tablet    Sig: Take 1 tablet (10 mg total) by mouth daily.    Dispense:  30 tablet    Refill:  2  . fluticasone (FLONASE) 50 MCG/ACT nasal spray    Sig: Place 2 sprays into both nostrils daily.    Dispense:  16 g    Refill:  2    Disposition:  Follow up prn  Signed, [name],  FNP

## 2020-05-31 NOTE — Progress Notes (Signed)
Cardiology Office Note:    Date:  06/02/2020   ID:  [name], DOB [birth year], MRN [id number]  PCP:  William Felts, FNP  Cardiologist:  William Noe, MD   Referring MD: William Felts, FNP   Chief Complaint  Patient presents with  . Coronary Artery Disease  . Atrial Fibrillation  . Hypertension  . Hyperlipidemia    History of Present Illness:    [name] is a 84 y.o. male with a hx of systemic hypertension, hyperlipidemia, CVA, PAD ([place]), Prediabetes, abnormal EKG (right bundle, prominent voltage, and sinus bradycardia), mechanical fall 10/2019, NSTEMI during admission for fall, cath revealed 3 V CAD, --> CABG x 3 (LIMA-LAD, SVG-PDA, SVG-Diag) and Atrial ligation, and post Op atrial fibrillation treated with amiodarone..  Quite a recent history of issues at develop since I last saw him.  He had a mechanical fall.  It was noted that cardiac markers were elevated.  No specific work-up was done immediately.  Subsequently he began complaining of indigestion and ultimately developed significant shortness of breath.  His niece, William Hunter, RN, brought him to the emergency room where he was found to be having a non-ST elevation MI.  He underwent cardiac catheterization, was found to have severe three-vessel coronary disease, and subsequently underwent ligation of left atrial appendage and three-vessel coronary bypass grafting.  Amiodarone was used for period of time after surgery.  He is now off amiodarone.  No neurological complaints of occurred.  No episodes of syncope have occurred.  He denies palpitations and angina.  Indigestion has completely resolved.  Recent echo demonstrated LVEF 35% to 65%.  Echo also demonstrated severe systolic pulmonary hypertension with a value estimated at 71 mmHg.  RV size and function are normal as outlined below.  This led to a sleep study which demonstrated both central and obstructive sleep apnea.  This is in the process of being evaluated  and managed.  Past Medical History:  Diagnosis Date  . Arthritis   . Bundle branch block   . Hypercholesteremia   . Hyperlipidemia 2001  . Hypertension 2001  . Hypertension   . Peripheral vascular disease (HCC)   . Stroke (HCC)   . Stroke [facility])    2001 - R side deficits    Past Surgical History:  Procedure Laterality Date  . ABDOMINAL AORTOGRAM N/A 11/18/2019   Procedure: ABDOMINAL AORTOGRAM;  Surgeon: Corky Crafts, MD;  Location: [facility] INVASIVE CV LAB;  Service: Cardiovascular;  Laterality: N/A;  . CORONARY ARTERY BYPASS GRAFT N/A 11/20/2019   Procedure: CORONARY ARTERY BYPASS GRAFTING (CABG), ON PUMP, TIMES THREE, USING LEFT INTERNAL MAMMARY ARTERY AND ENDOSCOPICALLY HARVESTED RIGHT GREATER SAPHENOUS VEIN;  Surgeon: Linden Dolin, MD;  Location: [facility] OR;  Service: Open Heart Surgery;  Laterality: N/A;  . LEFT HEART CATH AND CORONARY ANGIOGRAPHY N/A 11/18/2019   Procedure: LEFT HEART CATH AND CORONARY ANGIOGRAPHY;  Surgeon: Corky Crafts, MD;  Location: [facility] INVASIVE CV LAB;  Service: Cardiovascular;  Laterality: N/A;  . TEE WITHOUT CARDIOVERSION N/A 11/20/2019   Procedure: TRANSESOPHAGEAL ECHOCARDIOGRAM (TEE);  Surgeon: Linden Dolin, MD;  Location: [facility] OR;  Service: Open Heart Surgery;  Laterality: N/A;    Current Medications: Current Meds  Medication Sig  . acetaminophen (TYLENOL) 325 MG tablet Take 2 tablets (650 mg total) by mouth every 4 (four) hours as needed for headache or mild pain.  [name] aspirin EC 81 MG tablet Take 81 mg by mouth daily.  . cetirizine (ZYRTEC) 10  MG tablet Take 10 mg by mouth as needed for allergies.  . Cholecalciferol (VITAMIN D) 50 MCG (2000 UT) tablet Take 2,000 Units by mouth daily.  . clopidogrel (PLAVIX) 75 MG tablet TAKE 1 TABLET BY MOUTH DAILY  . diclofenac sodium (VOLTAREN) 1 % GEL Apply 2 g topically 4 (four) times daily. Rub into affected area of foot 2 to 4 times daily  . fluticasone (FLONASE) 50 MCG/ACT nasal spray Place 2 sprays  into both nostrils daily.  . montelukast (SINGULAIR) 10 MG tablet Take 1 tablet (10 mg total) by mouth daily.  . polyethylene glycol (MIRALAX / GLYCOLAX) 17 g packet Take 17 g by mouth daily.  . rosuvastatin (CRESTOR) 20 MG tablet Take 1 tablet (20 mg total) by mouth daily.  . tamsulosin (FLOMAX) 0.4 MG CAPS capsule   . traMADol (ULTRAM) 50 MG tablet Take 1 tablet (50 mg total) by mouth every 6 (six) hours as needed for moderate pain.  [name] zinc gluconate 50 MG tablet Take 50 mg by mouth daily.     Allergies:   Patient has no known allergies.   Social History   Socioeconomic History  . Marital status: Single    Spouse name: Not on file  . Number of children: Not on file  . Years of education: Not on file  . Highest education level: Not on file  Occupational History  . Occupation: retired  Tobacco Use  . Smoking status: Never Smoker  . Smokeless tobacco: Never Used  Vaping Use  . Vaping Use: Never assessed  Substance and Sexual Activity  . Alcohol use: Never  . Drug use: Never  . Sexual activity: Not Currently  Other Topics Concern  . Not on file  Social History Narrative   ** Merged History Encounter **       Social Determinants of Health   Financial Resource Strain: Low Risk   . Difficulty of Paying Living Expenses: Not hard at all  Food Insecurity: No Food Insecurity  . Worried About Programme researcher, broadcasting/film/video in the Last Year: Never true  . Ran Out of Food in the Last Year: Never true  Transportation Needs: No Transportation Needs  . Lack of Transportation (Medical): No  . Lack of Transportation (Non-Medical): No  Physical Activity: Inactive  . Days of Exercise per Week: 0 days  . Minutes of Exercise per Session: 0 min  Stress: No Stress Concern Present  . Feeling of Stress : Not at all  Social Connections:   . Frequency of Communication with Friends and Family: Not on file  . Frequency of Social Gatherings with Friends and Family: Not on file  . Attends Religious  Services: Not on file  . Active Member of Clubs or Organizations: Not on file  . Attends Banker Meetings: Not on file  . Marital Status: Not on file     Family History: The patient's family history includes Cancer in his brother; Heart attack in his father; Stroke in his father and mother.  ROS:   Please see the history of present illness.    Still not sleeping well.  He has not had CPAP titration yet.  He sleeps a lot during the daytime.  All other systems reviewed and are negative.  EKGs/Labs/Other Studies Reviewed:    The following studies were reviewed today:  ECHOCARDIOGRAM 02/2020) IMPRESSIONS  1. Left ventricular ejection fraction, by estimation, is 50 to 55%. The  left ventricle has low normal function. There is moderate asymmetric left  ventricular hypertrophy of the basal-septal segment. Left ventricular  diastolic parameters are consistent  with Grade III diastolic dysfunction (restrictive). Elevated left atrial  pressure.  2. Right ventricular systolic function is mildly reduced. The right  ventricular size is mildly enlarged. There is severely elevated pulmonary  artery systolic pressure. The estimated right ventricular systolic  pressure is 71.7 mmHg.  3. Right atrial size was moderately dilated.  4. The mitral valve is normal in structure. Moderate mitral valve  regurgitation.  5. Tricuspid valve regurgitation is mild to moderate.  6. The aortic valve was not well visualized. Aortic valve regurgitation  is not visualized. No aortic stenosis is present.  7. The inferior vena cava is normal in size with <50% respiratory  variability, suggesting right atrial pressure of 8 mmHg.   Comparison(s): 11/19/19 EF 35-40%.  SLEEPSTUDY 04/2020: After severe pulmonary hypertension identified DIAGNOSIS - Obstructive Sleep Apnea (G47.33) - Central Sleep Apnea (G47.37)  RECOMMENDATIONS - CPAP titration recommended in lab given complex sleep apnea to  determine optimal pressure required to alleviate sleep disordered breathing. BiPAP or ASV titration may be required to eliminate central sleep apnea. - Avoid alcohol, sedatives and other CNS depressants that may worsen sleep apnea and disrupt normal sleep architecture. - Sleep hygiene should be reviewed to assess factors that may improve sleep quality. - Weight management and regular exercise should be initiated or continued if appropriate.    EKG:  EKG not repeated  Recent Labs: 11/17/2019: B Natriuretic Peptide 2,124.2 11/21/2019: Magnesium 2.7 11/28/2019: Hemoglobin 12.1; Platelets 227 12/10/2019: ALT 27; TSH 4.160 03/08/2020: BUN 15; Creatinine, Ser 1.26; Potassium 4.2; Sodium 139  Recent Lipid Panel    Component Value Date/Time   CHOL 108 11/18/2019 0222   CHOL 157 06/30/2019 1701   TRIG 44 11/18/2019 0222   HDL 43 11/18/2019 0222   HDL 61 06/30/2019 1701   CHOLHDL 2.5 11/18/2019 0222   VLDL 9 11/18/2019 0222   LDLCALC 56 11/18/2019 0222   LDLCALC 84 06/30/2019 1701    Physical Exam:    VS:  BP 124/74   Pulse 66   Ht 5\' 11"  (1.803 m)   Wt 163 lb 6.4 oz (74.1 kg)   SpO2 97%   BMI 22.79 kg/m     Wt Readings from Last 3 Encounters:  06/02/20 163 lb 6.4 oz (74.1 kg)  04/24/20 169 lb (76.7 kg)  02/29/20 161 lb 9.6 oz (73.3 kg)     GEN: Slender/frail. No acute distress HEENT: Normal NECK: No JVD. LYMPHATICS: No lymphadenopathy CARDIAC:  RRR without murmur, gallop, or edema. VASCULAR:  Normal Pulses. No bruits. RESPIRATORY:  Clear to auscultation without rales, wheezing or rhonchi  ABDOMEN: Soft, non-tender, non-distended, No pulsatile mass, MUSCULOSKELETAL: No deformity  SKIN: Warm and dry NEUROLOGIC: Right hemiparesis.  Metal brace right lower extremity.  Alert and appropriate. PSYCHIATRIC:  Normal affect   ASSESSMENT:    1. NSTEMI (non-ST elevated myocardial infarction) (HCC)   2. S/P CABG x 3   3. OSA (obstructive sleep apnea)   4. Mixed hyperlipidemia   5.  Prediabetes   6. Cerebrovascular accident (CVA) due to stenosis of carotid artery, unspecified blood vessel laterality (HCC)   7. Right bundle branch block   8. Essential hypertension   9. Educated about COVID-19 virus infection    PLAN:    In order of problems listed above:  1. LV function returned to normal after bypass grafting.  No recurrence of ischemic symptoms. 2. Coronary bypass grafting and left atrial  appendage occlusion was performed.  He is healed up nicely.  He has not had recurrent symptoms. 3. He has both obstructive and central sleep apnea.  A CPAP titration is planned. 4. Continue Crestor 20 mg/day.  LDL was 46 prior to surgery. 5. Most recent hemoglobin A1c was 5.8 in August.  Limit carbohydrate intake. 6. Chronic stable right hemiparesis after remote CVA. 7. Not reevaluated. 8. Blood pressure is currently excellent.  Was on therapy prior to surgery.  Continue to monitor weight, salt intake, and follow blood pressures with reinstitution of therapy when needed/if needed. 9. Vaccinated and practicing mitigation.  He is helped greatly by his niece, William Hunter, RN  Overall education and awareness concerning primary/secondary risk prevention was discussed in detail: LDL less than 70, hemoglobin A1c less than 7, blood pressure target less than 130/80 mmHg, >150 minutes of moderate aerobic activity per week, avoidance of smoking, weight control (via diet and exercise), and continued surveillance/management of/for obstructive sleep apnea.  3 to 61-month follow-up.  Monitor blood pressure and reinstitute therapy if needed.  Pulmonary   Medication Adjustments/Labs and Tests Ordered: Current medicines are reviewed at length with the patient today.  Concerns regarding medicines are outlined above.  No orders of the defined types were placed in this encounter.  No orders of the defined types were placed in this encounter.   Patient Instructions  Medication Instructions:  Your  physician recommends that you continue on your current medications as directed. Please refer to the Current Medication list given to you today.  *If you need a refill on your cardiac medications before your next appointment, please call your pharmacy*   Lab Work: None If you have labs (blood work) drawn today and your tests are completely normal, you will receive your results only by: [name] MyChart Message (if you have MyChart) OR . A paper copy in the mail If you have any lab test that is abnormal or we need to change your treatment, we will call you to review the results.   Testing/Procedures: None   Follow-Up: At [facility] you and your health needs are our priority.  As part of our continuing mission to provide you with exceptional heart care, we have created designated Provider Care Teams.  These Care Teams include your primary Cardiologist (physician) and Advanced Practice Providers (APPs -  Physician Assistants and Nurse Practitioners) who all work together to provide you with the care you need, when you need it.  We recommend signing up for the patient portal called "MyChart".  Sign up information is provided on this After Visit Summary.  MyChart is used to connect with patients for Virtual Visits (Telemedicine).  Patients are able to view lab/test results, encounter notes, upcoming appointments, etc.  Non-urgent messages can be sent to your provider as well.   To learn more about what you can do with MyChart, go to [email or web address].    Your next appointment:   3-4 month(s)  The format for your next appointment:   In Person  Provider:   You may see William Noe, MD or one of the following Advanced Practice Providers on your designated Care Team:    Norma Fredrickson, NP  Nada Boozer, NP  Georgie Chard, NP    Other Instructions      Signed, William Noe, MD  06/02/2020 12:20 PM    [facility]

## 2020-06-01 ENCOUNTER — Ambulatory Visit (HOSPITAL_COMMUNITY): Payer: Medicare PPO

## 2020-06-02 ENCOUNTER — Other Ambulatory Visit: Payer: Self-pay

## 2020-06-02 ENCOUNTER — Encounter: Payer: Self-pay | Admitting: Interventional Cardiology

## 2020-06-02 ENCOUNTER — Ambulatory Visit (INDEPENDENT_AMBULATORY_CARE_PROVIDER_SITE_OTHER): Payer: Medicare PPO | Admitting: Interventional Cardiology

## 2020-06-02 VITALS — BP 124/74 | HR 66 | Ht 71.0 in | Wt 163.4 lb

## 2020-06-02 DIAGNOSIS — I4510 Unspecified right bundle-branch block: Secondary | ICD-10-CM | POA: Diagnosis not present

## 2020-06-02 DIAGNOSIS — Z7189 Other specified counseling: Secondary | ICD-10-CM

## 2020-06-02 DIAGNOSIS — I214 Non-ST elevation (NSTEMI) myocardial infarction: Secondary | ICD-10-CM | POA: Diagnosis not present

## 2020-06-02 DIAGNOSIS — E782 Mixed hyperlipidemia: Secondary | ICD-10-CM

## 2020-06-02 DIAGNOSIS — Z951 Presence of aortocoronary bypass graft: Secondary | ICD-10-CM | POA: Diagnosis not present

## 2020-06-02 DIAGNOSIS — R7303 Prediabetes: Secondary | ICD-10-CM | POA: Diagnosis not present

## 2020-06-02 DIAGNOSIS — I63239 Cerebral infarction due to unspecified occlusion or stenosis of unspecified carotid arteries: Secondary | ICD-10-CM

## 2020-06-02 DIAGNOSIS — G4733 Obstructive sleep apnea (adult) (pediatric): Secondary | ICD-10-CM

## 2020-06-02 DIAGNOSIS — I10 Essential (primary) hypertension: Secondary | ICD-10-CM | POA: Diagnosis not present

## 2020-06-02 NOTE — Patient Instructions (Signed)
Medication Instructions:  Your physician recommends that you continue on your current medications as directed. Please refer to the Current Medication list given to you today.  *If you need a refill on your cardiac medications before your next appointment, please call your pharmacy*   Lab Work: None If you have labs (blood work) drawn today and your tests are completely normal, you will receive your results only by: . MyChart Message (if you have MyChart) OR . A paper copy in the mail If you have any lab test that is abnormal or we need to change your treatment, we will call you to review the results.   Testing/Procedures: None   Follow-Up: At [facility], you and your health needs are our priority.  As part of our continuing mission to provide you with exceptional heart care, we have created designated Provider Care Teams.  These Care Teams include your primary Cardiologist (physician) and Advanced Practice Providers (APPs -  Physician Assistants and Nurse Practitioners) who all work together to provide you with the care you need, when you need it.  We recommend signing up for the patient portal called "MyChart".  Sign up information is provided on this After Visit Summary.  MyChart is used to connect with patients for Virtual Visits (Telemedicine).  Patients are able to view lab/test results, encounter notes, upcoming appointments, etc.  Non-urgent messages can be sent to your provider as well.   To learn more about what you can do with MyChart, go to [email or web address].    Your next appointment:   3-4 month(s)  The format for your next appointment:   In Person  Provider:   You may see Henry W Smith III, MD or one of the following Advanced Practice Providers on your designated Care Team:    Lori Gerhardt, NP  Laura Ingold, NP  Jill McDaniel, NP    Other Instructions   

## 2020-06-03 ENCOUNTER — Ambulatory Visit (HOSPITAL_COMMUNITY): Payer: Medicare PPO

## 2020-06-05 ENCOUNTER — Encounter (HOSPITAL_BASED_OUTPATIENT_CLINIC_OR_DEPARTMENT_OTHER): Payer: Medicare PPO | Admitting: Cardiology

## 2020-06-06 ENCOUNTER — Ambulatory Visit (HOSPITAL_COMMUNITY): Payer: Medicare PPO

## 2020-06-08 ENCOUNTER — Ambulatory Visit (HOSPITAL_COMMUNITY): Payer: Medicare PPO

## 2020-06-08 NOTE — Telephone Encounter (Signed)
Patient's niece is calling to reschedule CPAP titration study, previously scheduled for 06/05/20. Please call.

## 2020-06-10 ENCOUNTER — Ambulatory Visit (HOSPITAL_COMMUNITY): Payer: Medicare PPO

## 2020-06-13 ENCOUNTER — Ambulatory Visit (HOSPITAL_COMMUNITY): Payer: Medicare PPO

## 2020-06-13 ENCOUNTER — Telehealth: Payer: Self-pay | Admitting: Interventional Cardiology

## 2020-06-13 NOTE — Telephone Encounter (Signed)
  Return call: Called and left message the cpap titration has been cancelled and he should call back to reschedule at the sleep lab at [phone].

## 2020-06-13 NOTE — Telephone Encounter (Signed)
[name], Niece of the patient called to reschedule the patient's CPAP Titration. The niece states she called last week but did not get a response yet. The niece would like to get the titration rescheduled asap. Please call back

## 2020-06-13 NOTE — Telephone Encounter (Signed)
Return call: Called and left message the cpap titration has been cancelled and he should call back to reschedule at the sleep lab at [phone].

## 2020-06-15 ENCOUNTER — Ambulatory Visit (HOSPITAL_COMMUNITY): Payer: Medicare PPO

## 2020-06-15 NOTE — Telephone Encounter (Signed)
Patient called to cancel his sleep study on 06/05/20 and rescheduled to 07/08/20. He will need a extension as his valid dates run out on 06/15/20. Message has been sent back to precert to get a rate extension.

## 2020-06-16 NOTE — Telephone Encounter (Signed)
Valid from 07/08/20 - 08/07/20

## 2020-06-17 ENCOUNTER — Ambulatory Visit (HOSPITAL_COMMUNITY): Payer: Medicare PPO

## 2020-06-17 ENCOUNTER — Other Ambulatory Visit: Payer: Self-pay | Admitting: Cardiothoracic Surgery

## 2020-06-17 NOTE — Telephone Encounter (Signed)
RE: Dalene Seltzer, CMA Valid from 07/08/20 - 08/07/20          ----- Message -----  From: Reesa Chew, CMA  Sent: 06/15/2020  1:41 PM EST  To: [facility]  Subject: PRECERT                      Need date extension dates run out today 06/15/20   RE: precert  Mordecai Maes, Soundra Pilon, CMA Reesa Chew, CMA  Ok to schedule. H umana Auth received. Valid dates 05/16/20 to 06/15/20.

## 2020-06-24 ENCOUNTER — Other Ambulatory Visit: Payer: Self-pay | Admitting: Nurse Practitioner

## 2020-07-05 ENCOUNTER — Encounter: Payer: Medicare Other | Admitting: Nurse Practitioner

## 2020-07-06 ENCOUNTER — Other Ambulatory Visit: Payer: Self-pay

## 2020-07-06 MED ORDER — ROSUVASTATIN CALCIUM 20 MG PO TABS: 20.0000 mg | ORAL_TABLET | Freq: Every day | ORAL | 3 refills | Status: DC

## 2020-07-08 ENCOUNTER — Ambulatory Visit (HOSPITAL_BASED_OUTPATIENT_CLINIC_OR_DEPARTMENT_OTHER): Payer: Medicare PPO | Attending: Cardiology | Admitting: Cardiology

## 2020-07-08 ENCOUNTER — Other Ambulatory Visit: Payer: Self-pay

## 2020-07-08 DIAGNOSIS — G4733 Obstructive sleep apnea (adult) (pediatric): Secondary | ICD-10-CM | POA: Diagnosis not present

## 2020-07-18 DIAGNOSIS — I10 Essential (primary) hypertension: Secondary | ICD-10-CM | POA: Diagnosis not present

## 2020-07-18 DIAGNOSIS — Z86010 Personal history of colonic polyps: Secondary | ICD-10-CM | POA: Diagnosis not present

## 2020-07-18 DIAGNOSIS — E782 Mixed hyperlipidemia: Secondary | ICD-10-CM | POA: Diagnosis not present

## 2020-07-18 DIAGNOSIS — I2510 Atherosclerotic heart disease of native coronary artery without angina pectoris: Secondary | ICD-10-CM | POA: Diagnosis not present

## 2020-07-19 ENCOUNTER — Other Ambulatory Visit: Payer: Self-pay

## 2020-07-19 ENCOUNTER — Telehealth: Payer: Self-pay | Admitting: Cardiology

## 2020-07-19 DIAGNOSIS — I739 Peripheral vascular disease, unspecified: Secondary | ICD-10-CM

## 2020-07-19 DIAGNOSIS — I6523 Occlusion and stenosis of bilateral carotid arteries: Secondary | ICD-10-CM

## 2020-07-19 NOTE — Telephone Encounter (Signed)
Patient's niece, [name], is calling for sleep study results. Please advise.

## 2020-07-21 ENCOUNTER — Encounter: Payer: Self-pay | Admitting: Nurse Practitioner

## 2020-07-21 ENCOUNTER — Other Ambulatory Visit: Payer: Self-pay

## 2020-07-21 ENCOUNTER — Ambulatory Visit: Payer: Medicare PPO | Admitting: Nurse Practitioner

## 2020-07-21 VITALS — BP 130/70 | HR 69 | Temp 98.2°F | Ht 71.0 in | Wt 165.2 lb

## 2020-07-21 DIAGNOSIS — G8191 Hemiplegia, unspecified affecting right dominant side: Secondary | ICD-10-CM

## 2020-07-21 DIAGNOSIS — I10 Essential (primary) hypertension: Secondary | ICD-10-CM

## 2020-07-21 DIAGNOSIS — E782 Mixed hyperlipidemia: Secondary | ICD-10-CM

## 2020-07-21 DIAGNOSIS — R7303 Prediabetes: Secondary | ICD-10-CM | POA: Diagnosis not present

## 2020-07-21 DIAGNOSIS — I739 Peripheral vascular disease, unspecified: Secondary | ICD-10-CM | POA: Diagnosis not present

## 2020-07-21 DIAGNOSIS — I639 Cerebral infarction, unspecified: Secondary | ICD-10-CM | POA: Diagnosis not present

## 2020-07-21 DIAGNOSIS — Z0000 Encounter for general adult medical examination without abnormal findings: Secondary | ICD-10-CM

## 2020-07-21 DIAGNOSIS — H6123 Impacted cerumen, bilateral: Secondary | ICD-10-CM

## 2020-07-21 LAB — POCT URINALYSIS DIPSTICK
Bilirubin, UA: NEGATIVE
Blood, UA: NEGATIVE
Glucose, UA: NEGATIVE
Ketones, UA: NEGATIVE
Leukocytes, UA: NEGATIVE
Nitrite, UA: NEGATIVE
Protein, UA: NEGATIVE
Spec Grav, UA: 1.01 (ref 1.010–1.025)
Urobilinogen, UA: 0.2 E.U./dL
pH, UA: 6 (ref 5.0–8.0)

## 2020-07-21 LAB — POCT UA - MICROALBUMIN
Creatinine, POC: 50 mg/dL
Microalbumin Ur, POC: 30 mg/L

## 2020-07-21 NOTE — Progress Notes (Signed)
I,Tianna Badgett,acting as a Education administrator for [employer], NP.,have documented all relevant documentation on the behalf of [employer], NP,as directed by  Bary Castilla, NP while in the presence of Bary Castilla, NP.  This visit occurred during the SARS-CoV-2 public health emergency.  Safety protocols were in place, including screening questions prior to the visit, additional usage of staff PPE, and extensive cleaning of exam room while observing appropriate contact time as indicated for disinfecting solutions.  Subjective:     Patient ID: [name] , male    DOB: [birth year] , 84 y.o.   MRN: [id number]   Chief Complaint  Patient presents with   Annual Exam    HPI  Patient is here for physical exam. He reports compliance with medications. He has no concerns at this.  Diet: He is not eating that healthy. Eats a lot of pork. Well balanced diet. Walks with a cane. Balance is good. Will check niece to see if he is being followed by the vein specialist and urologist.    He goes Monday for his flu shot. Will talk to other niece about Covid booster.   Hypertension This is a chronic problem. The current episode started more than 1 year ago. The problem is unchanged. The problem is controlled. Pertinent negatives include no anxiety, blurred vision, chest pain, headaches, malaise/fatigue, palpitations, peripheral edema or shortness of breath.     Past Medical History:  Diagnosis Date   Arthritis    Bundle branch block    Hypercholesteremia    Hyperlipidemia 2001   Hypertension 2001   Hypertension    Peripheral vascular disease ([place])    Stroke ([place])    Stroke ([place])    2001 - R side deficits     Family History  Problem Relation Age of Onset   Stroke Mother    Stroke Father    Heart attack Father    Cancer Brother      Current Outpatient Medications:    acetaminophen (TYLENOL) 325 MG tablet, Take 2 tablets (650 mg total) by mouth every 4 (four)  hours as needed for headache or mild pain., Disp:  , Rfl:    aspirin EC 81 MG tablet, Take 81 mg by mouth daily., Disp: , Rfl:    cetirizine (ZYRTEC) 10 MG tablet, Take 10 mg by mouth as needed for allergies., Disp: , Rfl:    Cholecalciferol (VITAMIN D) 50 MCG (2000 UT) tablet, Take 2,000 Units by mouth daily., Disp: , Rfl:    clopidogrel (PLAVIX) 75 MG tablet, TAKE 1 TABLET BY MOUTH DAILY, Disp: 90 tablet, Rfl: 1   diclofenac sodium (VOLTAREN) 1 % GEL, Apply 2 g topically 4 (four) times daily. Rub into affected area of foot 2 to 4 times daily, Disp: 100 g, Rfl: 2   fluticasone (FLONASE) 50 MCG/ACT nasal spray, Place 2 sprays into both nostrils daily., Disp: 16 g, Rfl: 2   montelukast (SINGULAIR) 10 MG tablet, Take 1 tablet (10 mg total) by mouth daily., Disp: 30 tablet, Rfl: 2   polyethylene glycol (MIRALAX / GLYCOLAX) 17 g packet, Take 17 g by mouth daily., Disp: 14 each, Rfl: 0   rosuvastatin (CRESTOR) 20 MG tablet, Take 1 tablet (20 mg total) by mouth daily., Disp: 30 tablet, Rfl: 3   tamsulosin (FLOMAX) 0.4 MG CAPS capsule, , Disp: , Rfl:    traMADol (ULTRAM) 50 MG tablet, Take 1 tablet (50 mg total) by mouth every 6 (six) hours as needed for moderate pain., Disp: 30 tablet,  Rfl: 0   zinc gluconate 50 MG tablet, Take 50 mg by mouth daily., Disp: , Rfl:    No Known Allergies   Men's preventive visit. Patient Health Questionnaire (PHQ-2) is  Flowsheet Row Clinical Support from 02/18/2020 in [facility]  PHQ-2 Total Score 0    . Patient is not on a diet. Marital status: Single. Relevant history for alcohol use is:  Social History   Substance and Sexual Activity  Alcohol Use Never  . Relevant history for tobacco use is:  Social History   Tobacco Use  Smoking Status Never Smoker  Smokeless Tobacco Never Used  .   Review of Systems  Constitutional: Negative.  Negative for chills, fever and malaise/fatigue.  HENT: Negative.   Eyes: Negative.   Negative for blurred vision.  Respiratory: Negative.  Negative for shortness of breath.   Cardiovascular: Negative.  Negative for chest pain and palpitations.  Gastrointestinal: Negative.  Negative for constipation, diarrhea and nausea.  Endocrine: Negative.  Negative for cold intolerance and heat intolerance.  Genitourinary: Negative.  Negative for dysuria and frequency.  Musculoskeletal: Negative for joint swelling.       Right hemiparesis   Skin: Negative.   Allergic/Immunologic: Negative.   Neurological: Positive for weakness. Negative for headaches.       History of stroke.   Hematological: Negative.   Psychiatric/Behavioral: Negative.      Today's Vitals   07/21/20 1459  BP: 130/70  Pulse: 69  Temp: 98.2 F (36.8 C)  TempSrc: Oral  Weight: 165 lb 3.2 oz (74.9 kg)  Height: _0  (1.803 m)   Body mass index is 23.04 kg/m.  Wt Readings from Last 3 Encounters:  07/21/20 165 lb 3.2 oz (74.9 kg)  06/02/20 163 lb 6.4 oz (74.1 kg)  04/24/20 169 lb (76.7 kg)    Objective:  Physical Exam Constitutional:      Appearance: Normal appearance.  HENT:     Head: Normocephalic.     Right Ear: Tympanic membrane normal. There is impacted cerumen.     Left Ear: Tympanic membrane normal. There is impacted cerumen.     Ears:     Comments: Hard of hearing     Nose: Nose normal. No congestion.  Eyes:     Pupils: Pupils are equal, round, and reactive to light.  Cardiovascular:     Rate and Rhythm: Normal rate and regular rhythm.     Pulses: Normal pulses.     Heart sounds: Normal heart sounds.  Pulmonary:     Effort: Pulmonary effort is normal.     Breath sounds: No wheezing, rhonchi or rales.  Abdominal:     General: Bowel sounds are normal.     Palpations: Abdomen is soft.     Tenderness: There is no abdominal tenderness. There is no guarding.  Genitourinary:    Comments: Deferred. Patient declined  Musculoskeletal:     Cervical back: Normal range of motion. No tenderness.      Comments: Wearing a right foot brace and brace on right arm due to hx of stroke. Right hemiparesis   Skin:    General: Skin is warm and dry.  Neurological:     Mental Status: He is alert and oriented to person, place, and time.     Cranial Nerves: No facial asymmetry.     Motor: Weakness present.     Gait: Gait abnormal.     Comments: Walks with a cane   Psychiatric:  Mood and Affect: Mood normal.        Behavior: Behavior normal.        Judgment: Judgment normal.         Assessment And Plan:    1. Health maintenance examination -Behavior modification was discussed and diet history was reviewed with the patient and the niece.  -Pt. Will continue to watch what he eats and do exercises as tolerated  -He will continue to eat diet with fiber.  -Recommended intake of multivitamins and vitamin D  -Recommend for preventive screenings and immunization  - CBC - CMP14+EGFR - Lipid panel - POCT Urinalysis Dipstick (81002) - POCT UA - Microalbumin  2. Essential hypertension - CMP14+EGFR -BP is controlled  -Patient complaint with meds  -Importance of diet with low salt intake and decrease salt intake discussed with patient -follows up with cardiologist   3. Prediabetes -Chronic Continue with current meds. -Encouraged to increase physical activity to 150 min. Per weeks as tolerated with chair with exercises.  - Hemoglobin A1c  4. Right hemiparesis ([place]) -Chronic, hx of stroke  -Wears a brace on right foot and brace on right arm   5. Mixed hyperlipidemia -chronic, controlled  -continue with current meds.  -Educated about increasing fiber intake and fish intake - Lipid panel  6. PAD (peripheral artery disease) (HCC) -chronic, followed by vein and vascular  -currently stable   7. Bilateral impacted cerumen -used curette    Patient was given opportunity to ask questions. Patient verbalized understanding of the plan and was able to repeat key elements of the  plan. All questions were answered to their satisfaction.   Bary Castilla, NP   I, Bary Castilla, NP, have reviewed all documentation for this visit. The documentation on 07/21/20 for the exam, diagnosis, procedures, and orders are all accurate and complete.  THE PATIENT IS ENCOURAGED TO PRACTICE SOCIAL DISTANCING DUE TO THE COVID-19 PANDEMIC.

## 2020-07-21 NOTE — Patient Instructions (Signed)

## 2020-07-22 LAB — CBC
Hematocrit: 41.1 % (ref 37.5–51.0)
Hemoglobin: 13.9 g/dL (ref 13.0–17.7)
MCH: 30.8 pg (ref 26.6–33.0)
MCHC: 33.8 g/dL (ref 31.5–35.7)
MCV: 91 fL (ref 79–97)
Platelets: 243 10*3/uL (ref 150–450)
RBC: 4.51 x10E6/uL (ref 4.14–5.80)
RDW: 17.3 % — ABNORMAL HIGH (ref 11.6–15.4)
WBC: 5 10*3/uL (ref 3.4–10.8)

## 2020-07-22 LAB — CMP14+EGFR
ALT: 31 IU/L (ref 0–44)
AST: 27 IU/L (ref 0–40)
Albumin/Globulin Ratio: 1.8 (ref 1.2–2.2)
Albumin: 4.6 g/dL (ref 3.6–4.6)
Alkaline Phosphatase: 103 IU/L (ref 44–121)
BUN/Creatinine Ratio: 14 (ref 10–24)
BUN: 13 mg/dL (ref 8–27)
Bilirubin Total: 1.1 mg/dL (ref 0.0–1.2)
CO2: 22 mmol/L (ref 20–29)
Calcium: 9.6 mg/dL (ref 8.6–10.2)
Chloride: 102 mmol/L (ref 96–106)
Creatinine, Ser: 0.93 mg/dL (ref 0.76–1.27)
GFR calc Af Amer: 88 mL/min/{1.73_m2} (ref >59)
GFR calc non Af Amer: 76 mL/min/{1.73_m2} (ref >59)
Globulin, Total: 2.5 g/dL (ref 1.5–4.5)
Glucose: 93 mg/dL (ref 65–99)
Potassium: 4.6 mmol/L (ref 3.5–5.2)
Sodium: 139 mmol/L (ref 134–144)
Total Protein: 7.1 g/dL (ref 6.0–8.5)

## 2020-07-22 LAB — HEMOGLOBIN A1C
Est. average glucose Bld gHb Est-mCnc: 126 mg/dL
Hgb A1c MFr Bld: 6 % — ABNORMAL HIGH (ref 4.8–5.6)

## 2020-07-22 LAB — LIPID PANEL
Chol/HDL Ratio: 2.3 ratio (ref 0.0–5.0)
Cholesterol, Total: 145 mg/dL (ref 100–199)
HDL: 63 mg/dL (ref >39)
LDL Chol Calc (NIH): 69 mg/dL (ref 0–99)
Triglycerides: 67 mg/dL (ref 0–149)
VLDL Cholesterol Cal: 13 mg/dL (ref 5–40)

## 2020-07-25 NOTE — Telephone Encounter (Signed)
Secure chat sent to dr Mayford Knife for advisement.

## 2020-07-25 NOTE — Progress Notes (Signed)
Your Hgb A1c is elevated from 4 months ago at 6.0 from 5.8. I know you mentioned during your visit that you have not been eating a healthy diet. You are in the prediabetic range. Please continue to watch what you are eating. Limit sugary drinks and food. Avoid bread and pasta, instead eat healthy carbs such as fruits and vegetables, whole grains and low fat dairy products. Avoid saturated and trans fat. Increase your intake of fiber. Try to get some exercise in. Chair exercises are also very good. Your kidney and liver function is stable. Make sure you are drinking plenty of water. Happy Holidays!

## 2020-08-01 ENCOUNTER — Other Ambulatory Visit: Payer: Self-pay

## 2020-08-01 ENCOUNTER — Ambulatory Visit: Payer: Medicare PPO | Admitting: Physician Assistant

## 2020-08-01 ENCOUNTER — Ambulatory Visit (HOSPITAL_COMMUNITY)
Admission: RE | Admit: 2020-08-01 | Discharge: 2020-08-01 | Disposition: A | Discharge status: Home / Self Care | Payer: Medicare PPO | Source: Ambulatory Visit | Attending: Physician Assistant | Admitting: Physician Assistant

## 2020-08-01 ENCOUNTER — Ambulatory Visit (INDEPENDENT_AMBULATORY_CARE_PROVIDER_SITE_OTHER)
Admission: RE | Admit: 2020-08-01 | Discharge: 2020-08-01 | Disposition: A | Discharge status: Home / Self Care | Payer: Medicare PPO | Source: Ambulatory Visit | Attending: Physician Assistant | Admitting: Physician Assistant

## 2020-08-01 VITALS — BP 143/85 | HR 49 | Temp 98.3°F | Resp 20 | Ht 71.0 in | Wt 168.4 lb

## 2020-08-01 DIAGNOSIS — Z860100 Personal history of colon polyps, unspecified: Secondary | ICD-10-CM | POA: Insufficient documentation

## 2020-08-01 DIAGNOSIS — I739 Peripheral vascular disease, unspecified: Secondary | ICD-10-CM

## 2020-08-01 DIAGNOSIS — I2510 Atherosclerotic heart disease of native coronary artery without angina pectoris: Secondary | ICD-10-CM | POA: Insufficient documentation

## 2020-08-01 DIAGNOSIS — I6523 Occlusion and stenosis of bilateral carotid arteries: Secondary | ICD-10-CM

## 2020-08-01 DIAGNOSIS — I639 Cerebral infarction, unspecified: Secondary | ICD-10-CM | POA: Insufficient documentation

## 2020-08-01 DIAGNOSIS — E782 Mixed hyperlipidemia: Secondary | ICD-10-CM | POA: Insufficient documentation

## 2020-08-01 DIAGNOSIS — K5900 Constipation, unspecified: Secondary | ICD-10-CM | POA: Insufficient documentation

## 2020-08-01 NOTE — Procedures (Signed)
   Patient Name: [name], [name] Date: 07/08/2020 Gender: Male D.O.B: Apr 11, 1936 Age (years): 22 Referring Provider: Armanda Magic MD, ABSM Height (inches): 71 Interpreting Physician: Armanda Magic MD, ABSM Weight (lbs): 169 RPSGT: Cherylann Parr BMI: 24 MRN: [id number] Neck Size: 16.00  CLINICAL INFORMATION The patient is referred for a PAP titration MEDICATIONS Medications self-administered by patient taken the night of the study : TAMSULOSIN HYDROCHLORIDE, ROSUVASTATIN  SLEEP STUDY TECHNIQUE As per the AASM Manual for the Scoring of Sleep and Associated Events v2.3 (April 2016) with a hypopnea requiring 4% desaturations.  The channels recorded and monitored were frontal, central and occipital EEG, electrooculogram (EOG), submentalis EMG (chin), nasal and oral airflow, thoracic and abdominal wall motion, anterior tibialis EMG, snore microphone, electrocardiogram, and pulse oximetry. Bi-level positive airway pressure (BiPAP) was initiated and was titrated according to treat sleep-disordered breathing.  RESPIRATORY PARAMETERS Titration Optimal IPAP Pressure (cm): 21  Optimal EPAP Pressure (cm):17  AHI at Optimal Pressure (/hr):0.0  Min O2 at Optimal Pressure (%):96.0 Sleep % at Optimal (%):89  Supine % at Optimal (%):100   SLEEP ARCHITECTURE The study was initiated at 10:56:32 PM and terminated at 5:02:31 AM. The total recorded time was 366 minutes. EEG confirmed total sleep time was 258 minutes yielding a sleep efficiency of 70.5%. Sleep onset after lights out was 8.3 minutes with a REM latency of 148.0 minutes. The patient spent 35.66% of the night in stage N1 sleep, 56.78% in stage N2 sleep, 0.00% in stage N3 and 7.6% in REM. Wake after sleep onset (WASO) was 99.7 minutes. The Arousal Index was 43.3/hour.  LEG MOVEMENT DATA The total Periodic Limb Movements of Sleep (PLMS) were 29. The PLMS index was 6.74 .  CARDIAC DATA The 2 lead EKG demonstrated sinus rhythm. The mean  heart rate was 50.17 beats per minute. Other EKG findings include: None.  IMPRESSIONS - An optimal PAP pressure was selected for this patient ( 21/17 cm of water) - No snoring was audible during this study. - No cardiac abnormalities were noted during this study. - Mild periodic limb movements of sleep occurred during the study.  DIAGNOSIS - Obstructive Sleep Apnea (G47.33) - Central Sleep Apnea  RECOMMENDATIONS - Trial of BiPAP therapy on 21/17 cm H2O with a Medium size Resmed Full Face Mask AirFit F20 mask and heated humidification. - Avoid alcohol, sedatives and other CNS depressants that may worsen sleep apnea and disrupt normal sleep architecture. - Sleep hygiene should be reviewed to assess factors that may improve sleep quality. - Weight management and regular exercise should be initiated or continued. - Return to Sleep Center for re-evaluation in 8 weeks.   [Electronically signed] 08/01/2020 12:36 PM  Armanda Magic MD, ABSM Diplomate, American Board of Sleep Medicine   NPI: [id number]

## 2020-08-01 NOTE — Progress Notes (Addendum)
Office Note     CC:  follow up Requesting Provider:  [name], FNP  HPI: [name] is a 84 y.o. (19-Aug-1935) male who presents for routine follow-up of bilateral carotid artery stenosis and ABIs.  The patient was evaluated 1 year ago for peripheral arterial disease.  He had a duplex study obtained at that time that revealed right iliac artery occlusion and left superficial artery and external iliac artery stenoses.  His ABIs in July 2020 were 0.44 on the right and 0.94 on the left.  The patient was asymptomatic and he was counseled regarding medical management and exercise program.  He underwent aortogram by Dr. Eldridge Dace on November 18, 2019.  Findings included aortic atherosclerosis with no AAA.  Occluded right common iliac artery.  50% lesion of the left external artery.  He underwent coronary artery bypass grafting April 16th 2021.  The patient's niece, [name], accompanies him today.  He has occasional night cramps in his lower extremities.  He ambulates with a cane.  No extremity pain with ambulation.  He has residual right upper extremity weakness and mild dysarthria secondary to prior CVA.  He has no new neurologic symptoms.  Is compliant with aspirin, Plavix and statin.  He is not diabetic.  His niece states his antihypertensive medication was discontinued following his coronary surgery.   The patient is a nontobacco user.  Past Medical History:  Diagnosis Date  . Arthritis   . Bundle branch block   . Hypercholesteremia   . Hyperlipidemia 2001  . Hypertension 2001  . Hypertension   . Peripheral vascular disease (HCC)   . Stroke ([facility])   . Stroke [facility])    2001 - R side deficits    Past Surgical History:  Procedure Laterality Date  . ABDOMINAL AORTOGRAM N/A 11/18/2019   Procedure: ABDOMINAL AORTOGRAM;  Surgeon: Corky Crafts, MD;  Location: [facility] INVASIVE CV LAB;  Service: Cardiovascular;  Laterality: N/A;  . CORONARY ARTERY BYPASS GRAFT N/A 11/20/2019   Procedure:  CORONARY ARTERY BYPASS GRAFTING (CABG), ON PUMP, TIMES THREE, USING LEFT INTERNAL MAMMARY ARTERY AND ENDOSCOPICALLY HARVESTED RIGHT GREATER SAPHENOUS VEIN;  Surgeon: Linden Dolin, MD;  Location: [facility] OR;  Service: Open Heart Surgery;  Laterality: N/A;  . LEFT HEART CATH AND CORONARY ANGIOGRAPHY N/A 11/18/2019   Procedure: LEFT HEART CATH AND CORONARY ANGIOGRAPHY;  Surgeon: Corky Crafts, MD;  Location: [facility] INVASIVE CV LAB;  Service: Cardiovascular;  Laterality: N/A;  . TEE WITHOUT CARDIOVERSION N/A 11/20/2019   Procedure: TRANSESOPHAGEAL ECHOCARDIOGRAM (TEE);  Surgeon: Linden Dolin, MD;  Location[facility] OR;  Service: Open Heart Surgery;  Laterality: N/A;    Social History   Socioeconomic History  . Marital status: Single    Spouse name: Not on file  . Number of children: Not on file  . Years of education: Not on file  . Highest education level: Not on file  Occupational History  . Occupation: retired  Tobacco Use  . Smoking status: Never Smoker  . Smokeless tobacco: Never Used  Vaping Use  . Vaping Use: Not on file  Substance and Sexual Activity  . Alcohol use: Never  . Drug use: Never  . Sexual activity: Not Currently  Other Topics Concern  . Not on file  Social History Narrative   ** Merged History Encounter **       Social Determinants of Health   Financial Resource Strain: Low Risk   . Difficulty of Paying Living Expenses: Not hard at all  Food Insecurity: No Food Insecurity  . Worried About Programme researcher, broadcasting/film/video in the Last Year: Never true  . Ran Out of Food in the Last Year: Never true  Transportation Needs: No Transportation Needs  . Lack of Transportation (Medical): No  . Lack of Transportation (Non-Medical): No  Physical Activity: Inactive  . Days of Exercise per Week: 0 days  . Minutes of Exercise per Session: 0 min  Stress: No Stress Concern Present  . Feeling of Stress : Not at all  Social Connections: Not on file  Intimate Partner Violence: Not on  file   Family History  Problem Relation Age of Onset  . Stroke Mother   . Stroke Father   . Heart attack Father   . Cancer Brother     Current Outpatient Medications  Medication Sig Dispense Refill  . acetaminophen (TYLENOL) 325 MG tablet Take 2 tablets (650 mg total) by mouth every 4 (four) hours as needed for headache or mild pain.    [name] aspirin EC 81 MG tablet Take 81 mg by mouth daily.    . cetirizine (ZYRTEC) 10 MG tablet Take 10 mg by mouth as needed for allergies.    . Cholecalciferol (VITAMIN D) 50 MCG (2000 UT) tablet Take 2,000 Units by mouth daily.    . clopidogrel (PLAVIX) 75 MG tablet TAKE 1 TABLET BY MOUTH DAILY 90 tablet 1  . diclofenac sodium (VOLTAREN) 1 % GEL Apply 2 g topically 4 (four) times daily. Rub into affected area of foot 2 to 4 times daily 100 g 2  . fluticasone (FLONASE) 50 MCG/ACT nasal spray Place 2 sprays into both nostrils daily. 16 g 2  . montelukast (SINGULAIR) 10 MG tablet Take 1 tablet (10 mg total) by mouth daily. 30 tablet 2  . polyethylene glycol (MIRALAX / GLYCOLAX) 17 g packet Take 17 g by mouth daily. 14 each 0  . rosuvastatin (CRESTOR) 20 MG tablet Take 1 tablet (20 mg total) by mouth daily. 30 tablet 3  . tamsulosin (FLOMAX) 0.4 MG CAPS capsule     . traMADol (ULTRAM) 50 MG tablet Take 1 tablet (50 mg total) by mouth every 6 (six) hours as needed for moderate pain. 30 tablet 0  . zinc gluconate 50 MG tablet Take 50 mg by mouth daily.     No current facility-administered medications for this visit.    No Known Allergies   REVIEW OF SYSTEMS:   [X]  denotes positive finding, [ ]  denotes negative finding Cardiac  Comments:  Chest pain or chest pressure:    Shortness of breath upon exertion:    Short of breath when lying flat:    Irregular heart rhythm:        Vascular    Pain in calf, thigh, or hip brought on by ambulation:    Pain in feet at night that wakes you up from your sleep:  x See hpi  Blood clot in your veins:    Leg  swelling:         Pulmonary    Oxygen at home:    Productive cough:     Wheezing:         Neurologic    Sudden weakness in arms or legs:     Sudden numbness in arms or legs:     Sudden onset of difficulty speaking or slurred speech:    Temporary loss of vision in one eye:     Problems with dizziness:  Gastrointestinal    Blood in stool:     Vomited blood:         Genitourinary    Burning when urinating:     Blood in urine:        Psychiatric    Major depression:         Hematologic    Bleeding problems:    Problems with blood clotting too easily:        Skin    Rashes or ulcers:        Constitutional    Fever or chills:      PHYSICAL EXAMINATION:  Vitals:   08/01/20 1112 08/01/20 1113  BP: 127/65 (!) 143/85  Pulse: (!) 49   Resp: 20   Temp: 98.3 F (36.8 C)   SpO2: 99%    General:  WDWN in NAD; vital signs documented above Gait: Not observed HENT: WNL, normocephalic Pulmonary: normal non-labored breathing , without Rales, rhonchi,  wheezing Cardiac: regular HR, without  Murmurs without carotid bruit Abdomen: soft, NT, no masses Skin: without rashes Vascular Exam/Pulses: 2+ bilateral radial and left femoral pulses.  2+ left dorsalis pedis pulse.  I cannot palpate right femoral or pedal pulses. Extremities: without ischemic changes, without Gangrene , without cellulitis; without open wounds; both feet are warm with intact sensation and motor function. Musculoskeletal: no muscle wasting or atrophy  Neurologic: A&O X 3;  No focal weakness or paresthesias are detected Psychiatric:  The pt has Normal affect.   Non-Invasive Vascular Imaging:   08/01/2020 Right Carotid: Velocities in the right ICA are consistent with a 1-39% stenosis.   Left Carotid: Velocities in the left ICA are consistent with a 1-39% stenosis.   Vertebrals: Bilateral vertebral arteries demonstrate antegrade flow.  Subclavians: Normal flow hemodynamics were seen in bilateral  subclavian arteries.   ABI/TBIToday's ABIToday's TBIPrevious ABIPrevious TBI  +-------+-----------+-----------+------------+------------+  Right 0.64    0.53    0.44    0.24      +-------+-----------+-----------+------------+------------+  Left  0.98    0.63    0.94    0.51      +-------+-----------+-----------+------------+------------+   Right great toe pressure is 70.  Waveforms are monophasic Left great toe pressure is 83.  Waveforms are biphasic   ASSESSMENT/PLAN:: 84 y.o. male here for follow up for peripheral arterial disease.  Known right common iliac artery occlusion and 50% stenosis of the right external iliac artery, right distal SFA stenosis.  Left external iliac artery stenosis.  His left ABI is normal and has palpable left DP pulse.  Right ABI and TBI improved as compared to 1 year ago.  Continue aspirin, Plavix and statin.  Continue as much mobility as possible.  We discussed monitoring symptoms and skin changes and call us should there be concern.  We will reevaluate in 1 year with ABIs.  Bilateral carotid artery duplex is stable in the 1 to 39% stenosis range.  Prior CVA with right upper extremity weakness and no neurological symptoms.  Continue medical management and seek immediate medical attention should signs or symptoms of stroke/TIA occur.  We will repeat carotid duplex in 1 year.   Milinda Antis, PA-C [facility] [phone]  Clinic MD:   Dr. Darrick Penna on call

## 2020-08-02 ENCOUNTER — Telehealth: Payer: Self-pay | Admitting: *Deleted

## 2020-08-02 NOTE — Telephone Encounter (Signed)
Informed patient of sleep study results and patient understanding was verbalized. Patient understands her sleep study showed they had a successful PAP titration and let DME know that orders are in EPIC. Please set up 8 week OV with me.   Upon patient request DME selection is [facility]. Patient understands she/he will be contacted by [facility] to set up her/he cpap. Patient understands to call if [facility] does not contact her/he with new setup in a timely manner. Patient understands they will be called once confirmation has been received from [facility] that they have received their new machine to schedule 10 week follow up appointment.   [facility] notified of new cpap order  Please add to airview Patient was grateful for the call and thanked me. 

## 2020-08-02 NOTE — Telephone Encounter (Signed)
-----   Message from Quintella Reichert, MD sent at 08/01/2020 12:39 PM EST ----- Please let patient know that they had a successful PAP titration and let DME know that orders are in EPIC.  Please set up 8 week OV with me.

## 2020-08-03 NOTE — Telephone Encounter (Signed)
Informed patient of sleep study results and patient understanding was verbalized. Patient understands her sleep study showed they had a successful PAP titration and let DME know that orders are in EPIC. Please set up 8 week OV with me.   Upon patient request DME selection is [facility]. Patient understands she/he will be contacted by [facility] to set up her/he cpap. Patient understands to call if [facility] does not contact her/he with new setup in a timely manner. Patient understands they will be called once confirmation has been received from [facility] that they have received their new machine to schedule 10 week follow up appointment.   [facility] notified of new cpap order  Please add to airview Patient was grateful for the call and thanked me. 

## 2020-08-11 ENCOUNTER — Other Ambulatory Visit: Payer: Self-pay | Admitting: Nurse Practitioner

## 2020-08-11 DIAGNOSIS — J309 Allergic rhinitis, unspecified: Secondary | ICD-10-CM

## 2020-08-12 DIAGNOSIS — R300 Dysuria: Secondary | ICD-10-CM | POA: Diagnosis not present

## 2020-09-06 ENCOUNTER — Telehealth: Payer: Self-pay | Admitting: Nurse Practitioner

## 2020-09-06 NOTE — Telephone Encounter (Signed)
Spoke to niece [name]  Regarding awv and   She is wanting to know if pt still needs to wear brace on right leg.  She stated he has had this for over 20 years .It is time for her to order new shoes for patient where can she go to have pt re evaluated for brace before ordering shoes.  Please advise

## 2020-09-11 NOTE — Progress Notes (Deleted)
Cardiology Office Note:    Date:  09/11/2020   ID:  [name], DOB 07/16/1936, MRN [id number]  PCP:  [name], FNP  Cardiologist:  Lesleigh Noe, MD   Referring MD: [name], FNP   No chief complaint on file.   History of Present Illness:    [name] is a 85 y.o. male with a hx of systemic hypertension, hyperlipidemia, CVA, PAD ([place]), Prediabetes, abnormal EKG (right bundle, prominent voltage, and sinus bradycardia), mechanical fall 10/2019, NSTEMI during admission for fall, cath revealed 3 V CAD, --> CABG x 3 (LIMA-LAD, SVG-PDA, SVG-Diag) and Atrial ligation, and post Op atrial fibrillation treated with amiodarone.[name]  Past Medical History:  Diagnosis Date  . Arthritis   . Bundle branch block   . Hypercholesteremia   . Hyperlipidemia 2001  . Hypertension 2001  . Hypertension   . Peripheral vascular disease (HCC)   . Stroke (HCC)   . Stroke [facility])    2001 - R side deficits    Past Surgical History:  Procedure Laterality Date  . ABDOMINAL AORTOGRAM N/A 11/18/2019   Procedure: ABDOMINAL AORTOGRAM;  Surgeon: Corky Crafts, MD;  Location: [facility] INVASIVE CV LAB;  Service: Cardiovascular;  Laterality: N/A;  . CORONARY ARTERY BYPASS GRAFT N/A 11/20/2019   Procedure: CORONARY ARTERY BYPASS GRAFTING (CABG), ON PUMP, TIMES THREE, USING LEFT INTERNAL MAMMARY ARTERY AND ENDOSCOPICALLY HARVESTED RIGHT GREATER SAPHENOUS VEIN;  Surgeon: Linden Dolin, MD;  Location: [facility] OR;  Service: Open Heart Surgery;  Laterality: N/A;  . LEFT HEART CATH AND CORONARY ANGIOGRAPHY N/A 11/18/2019   Procedure: LEFT HEART CATH AND CORONARY ANGIOGRAPHY;  Surgeon: Corky Crafts, MD;  Location: [facility] INVASIVE CV LAB;  Service: Cardiovascular;  Laterality: N/A;  . TEE WITHOUT CARDIOVERSION N/A 11/20/2019   Procedure: TRANSESOPHAGEAL ECHOCARDIOGRAM (TEE);  Surgeon: Linden Dolin, MD;  Location: [facility] OR;  Service: Open Heart Surgery;  Laterality: N/A;    Current  Medications: No outpatient medications have been marked as taking for the 09/12/20 encounter (Appointment) with Lyn Records, MD.     Allergies:   Patient has no known allergies.   Social History   Socioeconomic History  . Marital status: Single    Spouse name: Not on file  . Number of children: Not on file  . Years of education: Not on file  . Highest education level: Not on file  Occupational History  . Occupation: retired  Tobacco Use  . Smoking status: Never Smoker  . Smokeless tobacco: Never Used  Vaping Use  . Vaping Use: Not on file  Substance and Sexual Activity  . Alcohol use: Never  . Drug use: Never  . Sexual activity: Not Currently  Other Topics Concern  . Not on file  Social History Narrative   ** Merged History Encounter **       Social Determinants of Health   Financial Resource Strain: Low Risk   . Difficulty of Paying Living Expenses: Not hard at all  Food Insecurity: No Food Insecurity  . Worried About Programme researcher, broadcasting/film/video in the Last Year: Never true  . Ran Out of Food in the Last Year: Never true  Transportation Needs: No Transportation Needs  . Lack of Transportation (Medical): No  . Lack of Transportation (Non-Medical): No  Physical Activity: Inactive  . Days of Exercise per Week: 0 days  . Minutes of Exercise per Session: 0 min  Stress: No Stress Concern Present  . Feeling of Stress :  Not at all  Social Connections: Not on file     Family History: The patient's family history includes Cancer in his brother; Heart attack in his father; Stroke in his father and mother.  ROS:   Please see the history of present illness.    *** All other systems reviewed and are negative.  EKGs/Labs/Other Studies Reviewed:    The following studies were reviewed today: ***  EKG:  EKG ***  Recent Labs: 11/17/2019: B Natriuretic Peptide 2,124.2 11/21/2019: Magnesium 2.7 12/10/2019: TSH 4.160 07/21/2020: ALT 31; BUN 13; Creatinine, Ser 0.93; Hemoglobin  13.9; Platelets 243; Potassium 4.6; Sodium 139  Recent Lipid Panel    Component Value Date/Time   CHOL 145 07/21/2020 1558   TRIG 67 07/21/2020 1558   HDL 63 07/21/2020 1558   CHOLHDL 2.3 07/21/2020 1558   CHOLHDL 2.5 11/18/2019 0222   VLDL 9 11/18/2019 0222   LDLCALC 69 07/21/2020 1558    Physical Exam:    VS:  There were no vitals taken for this visit.    Wt Readings from Last 3 Encounters:  08/01/20 168 lb 6.4 oz (76.4 kg)  07/21/20 165 lb 3.2 oz (74.9 kg)  06/02/20 163 lb 6.4 oz (74.1 kg)     GEN: ***. No acute distress HEENT: Normal NECK: No JVD. LYMPHATICS: No lymphadenopathy CARDIAC: *** murmur. RRR *** gallop, or edema. VASCULAR: *** Normal Pulses. No bruits. RESPIRATORY:  Clear to auscultation without rales, wheezing or rhonchi  ABDOMEN: Soft, non-tender, non-distended, No pulsatile mass, MUSCULOSKELETAL: No deformity  SKIN: Warm and dry NEUROLOGIC:  Alert and oriented x 3 PSYCHIATRIC:  Normal affect   ASSESSMENT:    1. S/P CABG x 3   2. PAD (peripheral artery disease) (HCC)   3. Cerebrovascular accident (CVA) due to stenosis of carotid artery, unspecified blood vessel laterality (HCC)   4. Mixed hyperlipidemia   5. OSA (obstructive sleep apnea)   6. Prediabetes   7. Right bundle branch block   8. Essential hypertension   9. Educated about COVID-19 virus infection    PLAN:    In order of problems listed above:  1. ***   Medication Adjustments/Labs and Tests Ordered: Current medicines are reviewed at length with the patient today.  Concerns regarding medicines are outlined above.  No orders of the defined types were placed in this encounter.  No orders of the defined types were placed in this encounter.   There are no Patient Instructions on file for this visit.   Signed, Lesleigh Noe, MD  09/11/2020 7:03 PM    [facility]

## 2020-09-12 ENCOUNTER — Ambulatory Visit: Payer: Medicare PPO | Admitting: Interventional Cardiology

## 2020-09-12 DIAGNOSIS — I4510 Unspecified right bundle-branch block: Secondary | ICD-10-CM

## 2020-09-12 DIAGNOSIS — E782 Mixed hyperlipidemia: Secondary | ICD-10-CM

## 2020-09-12 DIAGNOSIS — Z7189 Other specified counseling: Secondary | ICD-10-CM

## 2020-09-12 DIAGNOSIS — R7303 Prediabetes: Secondary | ICD-10-CM

## 2020-09-12 DIAGNOSIS — I10 Essential (primary) hypertension: Secondary | ICD-10-CM

## 2020-09-12 DIAGNOSIS — G4733 Obstructive sleep apnea (adult) (pediatric): Secondary | ICD-10-CM

## 2020-09-12 DIAGNOSIS — I739 Peripheral vascular disease, unspecified: Secondary | ICD-10-CM

## 2020-09-12 DIAGNOSIS — Z951 Presence of aortocoronary bypass graft: Secondary | ICD-10-CM

## 2020-09-12 DIAGNOSIS — I63239 Cerebral infarction due to unspecified occlusion or stenosis of unspecified carotid arteries: Secondary | ICD-10-CM

## 2020-09-14 ENCOUNTER — Other Ambulatory Visit: Payer: Self-pay | Admitting: Nurse Practitioner

## 2020-09-14 DIAGNOSIS — I739 Peripheral vascular disease, unspecified: Secondary | ICD-10-CM | POA: Diagnosis not present

## 2020-09-14 DIAGNOSIS — G4733 Obstructive sleep apnea (adult) (pediatric): Secondary | ICD-10-CM | POA: Diagnosis not present

## 2020-09-18 ENCOUNTER — Other Ambulatory Visit: Payer: Self-pay | Admitting: Nurse Practitioner

## 2020-09-20 ENCOUNTER — Other Ambulatory Visit: Payer: Self-pay

## 2020-09-20 ENCOUNTER — Telehealth: Payer: Self-pay

## 2020-09-20 DIAGNOSIS — I6523 Occlusion and stenosis of bilateral carotid arteries: Secondary | ICD-10-CM

## 2020-09-20 DIAGNOSIS — I739 Peripheral vascular disease, unspecified: Secondary | ICD-10-CM

## 2020-09-20 NOTE — Telephone Encounter (Signed)
Pt's niece called, concerned that patient almost fell and has a hematoma on his LLE. There is swelling and pain. The hematoma is not progressing but the swelling has gotten bigger. Advised her this was likely due to trauma not necessarily PAD, continue to rest and elevate. She would really like him to be seen. Placed on PA schedule tomorrow.

## 2020-09-21 ENCOUNTER — Ambulatory Visit: Payer: Medicare PPO | Admitting: Physician Assistant

## 2020-09-21 ENCOUNTER — Ambulatory Visit (HOSPITAL_COMMUNITY)
Admission: RE | Admit: 2020-09-21 | Discharge: 2020-09-21 | Disposition: A | Discharge status: Home / Self Care | Payer: Medicare PPO | Source: Ambulatory Visit | Attending: Surgery | Admitting: Surgery

## 2020-09-21 ENCOUNTER — Encounter (HOSPITAL_COMMUNITY): Payer: Self-pay

## 2020-09-21 ENCOUNTER — Other Ambulatory Visit: Payer: Self-pay

## 2020-09-21 VITALS — BP 147/73 | HR 51 | Temp 98.1°F | Resp 20 | Ht 71.0 in | Wt 171.6 lb

## 2020-09-21 DIAGNOSIS — S8012XA Contusion of left lower leg, initial encounter: Secondary | ICD-10-CM | POA: Diagnosis not present

## 2020-09-21 DIAGNOSIS — I739 Peripheral vascular disease, unspecified: Secondary | ICD-10-CM

## 2020-09-21 MED ORDER — CEPHALEXIN 500 MG PO CAPS
500.0000 mg | ORAL_CAPSULE | Freq: Three times a day (TID) | ORAL | 0 refills | Status: DC
Start: 2020-09-21 — End: 2020-11-08

## 2020-09-21 NOTE — Progress Notes (Addendum)
Office Note     CC:  follow up Requesting Provider:  [name], FNP  HPI: [name] is a 85 y.o. ([birth year]) male who presents for evaluation of hematoma of his leg after falling. He has history of carotid artery stenosis, right iliac artery occlusion and left superficial artery and external iliac artery stenoses.  He is maintained on aspirin and Plavix for history of previous stroke.  He has chronic right-sided weakness. He was seen on 08/01/2020 for carotid duplex follow-up and ABIs. He had no complaints of lower extremity pain with exercise or skin issues.  He is accompanied by his niece today.  The patient was entering his nephew's SUV last Wednesday or Thursday slipped and injured his left lower leg against the car door.  He has developed swelling and bruising.  He denies pain.  He denies fever or chills.  Past Medical History:  Diagnosis Date  . Arthritis   . Bundle branch block   . Hypercholesteremia   . Hyperlipidemia 2001  . Hypertension 2001  . Hypertension   . Peripheral vascular disease (HCC)   . Stroke (HCC)   . Stroke [facility])    2001 - R side deficits    Past Surgical History:  Procedure Laterality Date  . ABDOMINAL AORTOGRAM N/A 11/18/2019   Procedure: ABDOMINAL AORTOGRAM;  Surgeon: Corky Crafts, MD;  Location: [facility] INVASIVE CV LAB;  Service: Cardiovascular;  Laterality: N/A;  . CORONARY ARTERY BYPASS GRAFT N/A 11/20/2019   Procedure: CORONARY ARTERY BYPASS GRAFTING (CABG), ON PUMP, TIMES THREE, USING LEFT INTERNAL MAMMARY ARTERY AND ENDOSCOPICALLY HARVESTED RIGHT GREATER SAPHENOUS VEIN;  Surgeon: Linden Dolin, MD;  Location: [facility] OR;  Service: Open Heart Surgery;  Laterality: N/A;  . LEFT HEART CATH AND CORONARY ANGIOGRAPHY N/A 11/18/2019   Procedure: LEFT HEART CATH AND CORONARY ANGIOGRAPHY;  Surgeon: Corky Crafts, MD;  Location: [facility] INVASIVE CV LAB;  Service: Cardiovascular;  Laterality: N/A;  . TEE WITHOUT CARDIOVERSION N/A 11/20/2019    Procedure: TRANSESOPHAGEAL ECHOCARDIOGRAM (TEE);  Surgeon: Linden Dolin, MD;  Location: [facility] OR;  Service: Open Heart Surgery;  Laterality: N/A;    Social History   Socioeconomic History  . Marital status: Single    Spouse name: Not on file  . Number of children: Not on file  . Years of education: Not on file  . Highest education level: Not on file  Occupational History  . Occupation: retired  Tobacco Use  . Smoking status: Never Smoker  . Smokeless tobacco: Never Used  Vaping Use  . Vaping Use: Not on file  Substance and Sexual Activity  . Alcohol use: Never  . Drug use: Never  . Sexual activity: Not Currently  Other Topics Concern  . Not on file  Social History Narrative   ** Merged History Encounter **       Social Determinants of Health   Financial Resource Strain: Low Risk   . Difficulty of Paying Living Expenses: Not hard at all  Food Insecurity: No Food Insecurity  . Worried About Programme researcher, broadcasting/film/video in the Last Year: Never true  . Ran Out of Food in the Last Year: Never true  Transportation Needs: No Transportation Needs  . Lack of Transportation (Medical): No  . Lack of Transportation (Non-Medical): No  Physical Activity: Inactive  . Days of Exercise per Week: 0 days  . Minutes of Exercise per Session: 0 min  Stress: No Stress Concern Present  . Feeling of Stress : Not at  all  Social Connections: Not on file  Intimate Partner Violence: Not on file   Family History  Problem Relation Age of Onset  . Stroke Mother   . Stroke Father   . Heart attack Father   . Cancer Brother     Current Outpatient Medications  Medication Sig Dispense Refill  . acetaminophen (TYLENOL) 325 MG tablet Take 2 tablets (650 mg total) by mouth every 4 (four) hours as needed for headache or mild pain.    [name] aspirin EC 81 MG tablet Take 81 mg by mouth daily.    . cetirizine (ZYRTEC) 10 MG tablet Take 10 mg by mouth as needed for allergies.    . Cholecalciferol (VITAMIN D) 50  MCG (2000 UT) tablet Take 2,000 Units by mouth daily.    . clopidogrel (PLAVIX) 75 MG tablet TAKE 1 TABLET BY MOUTH DAILY 90 tablet 1  . diclofenac sodium (VOLTAREN) 1 % GEL Apply 2 g topically 4 (four) times daily. Rub into affected area of foot 2 to 4 times daily 100 g 2  . fluticasone (FLONASE) 50 MCG/ACT nasal spray Place 2 sprays into both nostrils daily. 16 g 2  . montelukast (SINGULAIR) 10 MG tablet TAKE 1 TABLET(10 MG) BY MOUTH DAILY 30 tablet 2  . polyethylene glycol (MIRALAX / GLYCOLAX) 17 g packet Take 17 g by mouth daily. 14 each 0  . rosuvastatin (CRESTOR) 20 MG tablet TAKE 1 TABLET(20 MG) BY MOUTH DAILY 30 tablet 3  . rosuvastatin (CRESTOR) 20 MG tablet TAKE 1 TABLET(20 MG) BY MOUTH DAILY 30 tablet 3  . tamsulosin (FLOMAX) 0.4 MG CAPS capsule     . traMADol (ULTRAM) 50 MG tablet Take 1 tablet (50 mg total) by mouth every 6 (six) hours as needed for moderate pain. 30 tablet 0  . zinc gluconate 50 MG tablet Take 50 mg by mouth daily.     No current facility-administered medications for this visit.    No Known Allergies   REVIEW OF SYSTEMS:   [X]  denotes positive finding, [ ]  denotes negative finding Cardiac  Comments:  Chest pain or chest pressure:    Shortness of breath upon exertion:    Short of breath when lying flat:    Irregular heart rhythm:        Vascular    Pain in calf, thigh, or hip brought on by ambulation:    Pain in feet at night that wakes you up from your sleep:     Blood clot in your veins:    Leg swelling:  x       Pulmonary    Oxygen at home:    Productive cough:     Wheezing:         Neurologic    Sudden weakness in arms or legs:     Sudden numbness in arms or legs:     Sudden onset of difficulty speaking or slurred speech:    Temporary loss of vision in one eye:     Problems with dizziness:         Gastrointestinal    Blood in stool:     Vomited blood:         Genitourinary    Burning when urinating:     Blood in urine:         Psychiatric    Major depression:         Hematologic    Bleeding problems:    Problems with blood clotting too easily:  Skin    Rashes or ulcers:        Constitutional    Fever or chills:      PHYSICAL EXAMINATION: Vitals:   09/21/20 1100  BP: (!) 147/73  Pulse: (!) 51  Resp: 20  Temp: 98.1 F (36.7 C)  SpO2: 99%    General:  WDWN in NAD; vital signs documented above Gait: Ambulates with a cane.  Has brace on right lower leg. HENT: WNL, normocephalic Pulmonary: normal non-labored breathing  Cardiac: regular HR, Skin: without rashes. Purple discoloration of approximately 2 to 3 cm circular area left anterior lower leg.  There is a small periphery of erythema.  This area is outlined in ink.  There is edema of the mid calf.  There is ecchymosis of the left lateral calf which has migrated into his foot. Vascular Exam/Pulses: 2+ left dorsalis pedis pulse.  Calf maximal diameter is 37 cm.  This level is marked on the skin. Extremities: without ischemic changes, without Gangrene , without cellulitis; without open wounds;  Musculoskeletal: no muscle wasting or atrophy  Neurologic: A&O X 3;  focal weakness of  right upper and lower extremity Psychiatric:  The pt has Normal affect.   ASSESSMENT/PLAN:: 85 y.o. male here for evaluation of left lower leg hematoma.  I discussed the case with Dr. Darrick Penna.  Will prescribe Keflex for mild erythema of left lower leg.  We telephoned the patient's niece [name] and reviewed the physical exam findings and recommendations.  We recommend holding Plavix for one week and will recheck his leg in 7 to 10 days.  They will call sooner should he develop fever, chills, worsening edema or pain of the left lower leg.  Milinda Antis, PA-C [facility] [phone]  Clinic MD:   Darrick Penna

## 2020-09-26 NOTE — Telephone Encounter (Signed)
My thoughts are the brace helps him to have a better gait.  Didn't he have a Physical therapist to evaluate this some time ago?

## 2020-09-27 NOTE — Telephone Encounter (Signed)
I left pt vm to call the office YL,RMA 

## 2020-09-28 ENCOUNTER — Telehealth: Payer: Self-pay

## 2020-09-28 DIAGNOSIS — N3020 Other chronic cystitis without hematuria: Secondary | ICD-10-CM | POA: Diagnosis not present

## 2020-09-28 DIAGNOSIS — N401 Enlarged prostate with lower urinary tract symptoms: Secondary | ICD-10-CM | POA: Diagnosis not present

## 2020-09-28 DIAGNOSIS — R3915 Urgency of urination: Secondary | ICD-10-CM | POA: Diagnosis not present

## 2020-09-28 NOTE — Telephone Encounter (Signed)
Patient sustained injury to his left calf and was seen in PA clinic on 2/16. He was advised to call sooner than his 2 week follow up if area worsened. According to niece, the swelling, redness and discomfort has increased. Discussed with PA, placed on MD schedule tomorrow for evaluation.

## 2020-09-29 ENCOUNTER — Other Ambulatory Visit: Payer: Self-pay

## 2020-09-29 ENCOUNTER — Ambulatory Visit (INDEPENDENT_AMBULATORY_CARE_PROVIDER_SITE_OTHER): Payer: Medicare PPO | Admitting: Vascular Surgery

## 2020-09-29 ENCOUNTER — Encounter: Payer: Self-pay | Admitting: Vascular Surgery

## 2020-09-29 VITALS — BP 130/63 | HR 65 | Temp 97.6°F | Resp 20 | Ht 71.0 in | Wt 171.0 lb

## 2020-09-29 DIAGNOSIS — S8012XA Contusion of left lower leg, initial encounter: Secondary | ICD-10-CM | POA: Diagnosis not present

## 2020-09-29 NOTE — Progress Notes (Signed)
Patient is an 85 year old male who returns for follow-up today.  He was seen last on 09/21/2020 in our APP clinic.  He developed a hematoma on his left leg after a fall.  He was previously on Plavix and aspirin for previous stroke.  Prior ABIs 08/03/2020 0.6 on the right side 0.98 on the left.  He was placed on a course of oral antibiotics 2 weeks ago as there was some erythema around the hematoma.  He returns today for further follow-up.  His niece is with him and is very concerned that this may have gotten larger.  He has not had any fever or chills.  He does not really have any significant pain from this.  His antibiotic course was completed today.  We are currently holding his Plavix.  He is on aspirin alone currently.  There has been no drainage from the hematoma.  Physical exam:  Vitals:   09/29/20 1114  BP: 130/63  Pulse: 65  Resp: 20  Temp: 97.6 F (36.4 C)  SpO2: 96%  Weight: 171 lb (77.6 kg)  Height: 5\' 11"  (1.803 m)    Left lower extremity: 5 x 4 cm raised area left pretibial region mild erythema but this has receded from previous ink mark from 2 weeks ago.  Skin is slightly thinned out but no obvious compromise at this point.  No pedal ankle or otherwise edema in the leg to suggest DVT.        Assessment: Hematoma pretibial region left leg.  Spent several minutes today reassuring the patient and his niece that this is most likely going to resolve on its own.  I would not consider draining this hematoma unless he has evidence that it has become infected or is rapidly expanding.  His niece thinks it may have gotten a little bit larger today but certainly this is not dramatic from his prior exam a few weeks ago.  Plan: Continue to hold Plavix.  Try to avoid trauma to the left leg.  Patient will follow up in a few weeks to recheck his left leg.  Would only consider evacuation of the hematoma if rapidly expanding or evidence of infection.  , MD [facility] [place] Office: [phone]

## 2020-10-05 ENCOUNTER — Ambulatory Visit: Payer: Medicare PPO

## 2020-10-12 DIAGNOSIS — G4733 Obstructive sleep apnea (adult) (pediatric): Secondary | ICD-10-CM | POA: Diagnosis not present

## 2020-10-12 DIAGNOSIS — I739 Peripheral vascular disease, unspecified: Secondary | ICD-10-CM | POA: Diagnosis not present

## 2020-10-18 NOTE — Telephone Encounter (Signed)
Patient has a 10 week follow up appointment scheduled for 4/19/ 2022. Patient understands he needs to keep this appointment for insurance compliance. Patient was grateful for the call and thanked me.

## 2020-10-20 ENCOUNTER — Other Ambulatory Visit: Payer: Self-pay

## 2020-10-20 ENCOUNTER — Encounter: Payer: Self-pay | Admitting: Vascular Surgery

## 2020-10-20 ENCOUNTER — Ambulatory Visit (INDEPENDENT_AMBULATORY_CARE_PROVIDER_SITE_OTHER): Payer: Medicare PPO | Admitting: Vascular Surgery

## 2020-10-20 VITALS — BP 157/71 | HR 58 | Temp 97.7°F | Resp 20 | Ht 71.0 in

## 2020-10-20 DIAGNOSIS — S8012XA Contusion of left lower leg, initial encounter: Secondary | ICD-10-CM

## 2020-10-20 NOTE — Progress Notes (Signed)
Patient is an 85 year old male who returns for follow-up today.  We have been following him for a hematoma in his left pretibial region.  This initially occurred after bumping his leg on a car I think.  He is currently off his Plavix.  Vaginally has some pins-and-needles type sensations in the hematoma area.  He has no pain in the foot.  He does not have any drainage.  The hematoma is slightly smaller according to his daughter.  Previous arteriogram by Dr. Eldridge Dace April 2021 showed occlusion of the right common iliac artery and 50% narrowing of the left external iliac artery.  Previous carotid duplex December 2021 showed minimal carotid disease with some atherosclerotic plaque.  Recent ABIs number of 2021 showed right side was 0.64 left side 0.98.  Past Medical History:  Diagnosis Date  . Arthritis   . Bundle branch block   . Hypercholesteremia   . Hyperlipidemia 2001  . Hypertension 2001  . Hypertension   . Peripheral vascular disease (HCC)   . Stroke (HCC)   . Stroke [facility])    2001 - R side deficits    Past Surgical History:  Procedure Laterality Date  . ABDOMINAL AORTOGRAM N/A 11/18/2019   Procedure: ABDOMINAL AORTOGRAM;  Surgeon: Corky Crafts, MD;  Location: [facility] INVASIVE CV LAB;  Service: Cardiovascular;  Laterality: N/A;  . CORONARY ARTERY BYPASS GRAFT N/A 11/20/2019   Procedure: CORONARY ARTERY BYPASS GRAFTING (CABG), ON PUMP, TIMES THREE, USING LEFT INTERNAL MAMMARY ARTERY AND ENDOSCOPICALLY HARVESTED RIGHT GREATER SAPHENOUS VEIN;  Surgeon: Linden Dolin, MD;  Location: [facility] OR;  Service: Open Heart Surgery;  Laterality: N/A;  . LEFT HEART CATH AND CORONARY ANGIOGRAPHY N/A 11/18/2019   Procedure: LEFT HEART CATH AND CORONARY ANGIOGRAPHY;  Surgeon: Corky Crafts, MD;  Location: [facility] INVASIVE CV LAB;  Service: Cardiovascular;  Laterality: N/A;  . TEE WITHOUT CARDIOVERSION N/A 11/20/2019   Procedure: TRANSESOPHAGEAL ECHOCARDIOGRAM (TEE);  Surgeon: Linden Dolin, MD;   Location: [facility] OR;  Service: Open Heart Surgery;  Laterality: N/A;   Physical exam:  Vitals:   10/20/20 1007  BP: (!) 157/71  Pulse: (!) 58  Resp: 20  Temp: 97.7 F (36.5 C)  SpO2: 97%  Height: 5\' 11"  (1.803 m)    General: Frail-appearing black male no acute distress  Extremities: 3 x 3 cm hematoma pretibial region left leg with some mild erythema laterally some dry skin and wrinkling of the skin around the hematoma suggestive of decreasing edema.  The hematoma is slightly fluctuant.  There is no drainage.  Assessment: Slowly resolving hematoma left pretibial region  Plan: Patient can resume his Plavix at this point.  He will follow-up in June 2022 with repeat carotid duplex exam.  If this shows no significant stenosis he probably does not need further carotid ultrasounds.  We will also obtain bilateral ABIs.  He will follow-up sooner if he has any deterioration of the hematoma in his leg.  July 2022, MD [facility] [place] Office: [phone]

## 2020-10-21 ENCOUNTER — Other Ambulatory Visit: Payer: Self-pay

## 2020-10-21 DIAGNOSIS — I6523 Occlusion and stenosis of bilateral carotid arteries: Secondary | ICD-10-CM

## 2020-10-21 DIAGNOSIS — I739 Peripheral vascular disease, unspecified: Secondary | ICD-10-CM

## 2020-10-24 ENCOUNTER — Other Ambulatory Visit: Payer: Self-pay

## 2020-10-24 DIAGNOSIS — G8191 Hemiplegia, unspecified affecting right dominant side: Secondary | ICD-10-CM

## 2020-10-24 NOTE — Progress Notes (Signed)
Referral placed. YL,RMA 

## 2020-10-25 ENCOUNTER — Ambulatory Visit: Payer: Medicare PPO | Admitting: Physician Assistant

## 2020-11-07 NOTE — Progress Notes (Signed)
Cardiology Office Note   Date:  11/08/2020   ID:  [name], DOB 1935/10/03, MRN [id number]  PCP:  [name], FNP  Cardiologist:  Dr. Katrinka Blazing     Chief Complaint  Patient presents with  . Coronary Artery Disease      History of Present Illness: [name] is a 85 y.o. male who presents for CAD, HTN, HLD, CVA, PAD, prediabetes, RBBB, mechanical falls NSTEMI during admission for fall, cath revealed 3 V CAD, --> CABG x 3 (LIMA-LAD, SVG-PDA, SVG-Diag) and Atrial ligation, and post Op atrial fibrillation treated with amiodarone  He was admitted 11/17/19 with chest pain about a month before admit was referred for RBBB and sinus bradycardia. At the visit HR were in the 60s and patient was asymptomatic. Dr. Katrinka Blazing reviewed duplex abd aortic aneurysm study from November 2020 which showed no evidence of aneurysm. The patient followswith Dr. Myra Gianotti for his PAD. ABIs and duplex showed significant findings including right iliac occlusion and left superficial femoral and external iliac artery stenosis for which he takes aspirin and plavix.He was seen [place] [facility]3/30/21in consult for an admission for a mechanical fall. In the ER he was level 2 trauma and was pan scanned which showed no acute fractures. Orthostaticsnormal. Echo showed EF 60-65% without RWMA. Troponin was elevated to 420 in the setting of elevated CK suspected demand ischemia. Heart rates remained in the 50s.He was discharged 11/05/18 home with home health.   Cardiac cath 11/18/19 with significant disease and underwent CABG Amiodarone 200 BID for 7 days then to 200 mg daily.  : LIMA-LAD, SVG-RCA, SVG-diagonal - 11/20/19 and Removal of left atrial appendage clot and left atrial appendage clipping (45 mm)  He did reasonably well in the hospital although did require a couple of extra days on the ventilator. He diuresed well and is in a skilled nursing facility now after discharge. There are no specific complaints other than  his appetite is poor. he blames it on facility   Dr Vickey Sages held lasix    Today his niece an Charity fundraiser is with him.  He had lost weight prior to NSTEMI.  May have been due to his CAD.  Now not much appetite due to food.  No chest pain or SOB.  His amiodarone is at 200 mg.  BP on lower end -do not want to add BB today.   He has Lt arm brace in place and Lt leg brace due to old CVA, he has condom catheter in place.   Very Hard of hearing.   undergoing sleep apnea eval.   Recent echo demonstrated LVEF 35% to 65%.  Echo also demonstrated severe systolic pulmonary hypertension with a value estimated at 71 mmHg.  RV size and function are normal as outlined below.  This led to a sleep study which demonstrated both central and obstructive sleep apnea.  This is in the process of being evaluated and managed.   Last seen by Dr. Katrinka Blazing 06/02/20 and was stable. Today he is doing well. Has arm brace and leg brace from prior stroke.  He is hard of hearing.  He is wearing his Bipap but during the night it comes off.  He has sleep appt this month.  No chest pain and no SOB.  His wt is stable.   His niece is with him, she is an Charity fundraiser.       Past Medical History:  Diagnosis Date  . Arthritis   . Bundle branch block   .  Hypercholesteremia   . Hyperlipidemia 2001  . Hypertension 2001  . Hypertension   . Peripheral vascular disease (HCC)   . Stroke (HCC)   . Stroke [facility]    2001 - R side deficits    Past Surgical History:  Procedure Laterality Date  . ABDOMINAL AORTOGRAM N/A 11/18/2019   Procedure: ABDOMINAL AORTOGRAM;  Surgeon: Corky Crafts, MD;  Location: [facility] INVASIVE CV LAB;  Service: Cardiovascular;  Laterality: N/A;  . CORONARY ARTERY BYPASS GRAFT N/A 11/20/2019   Procedure: CORONARY ARTERY BYPASS GRAFTING (CABG), ON PUMP, TIMES THREE, USING LEFT INTERNAL MAMMARY ARTERY AND ENDOSCOPICALLY HARVESTED RIGHT GREATER SAPHENOUS VEIN;  Surgeon: Linden Dolin, MD;  Location: [facility] OR;  Service: Open Heart  Surgery;  Laterality: N/A;  . LEFT HEART CATH AND CORONARY ANGIOGRAPHY N/A 11/18/2019   Procedure: LEFT HEART CATH AND CORONARY ANGIOGRAPHY;  Surgeon: Corky Crafts, MD;  Location: [facility] INVASIVE CV LAB;  Service: Cardiovascular;  Laterality: N/A;  . TEE WITHOUT CARDIOVERSION N/A 11/20/2019   Procedure: TRANSESOPHAGEAL ECHOCARDIOGRAM (TEE);  Surgeon: Linden Dolin, MD;  Location: [facility] OR;  Service: Open Heart Surgery;  Laterality: N/A;     Current Outpatient Medications  Medication Sig Dispense Refill  . acetaminophen (TYLENOL) 325 MG tablet Take 2 tablets (650 mg total) by mouth every 4 (four) hours as needed for headache or mild pain.    [name] aspirin EC 81 MG tablet Take 81 mg by mouth daily.    . cetirizine (ZYRTEC) 10 MG tablet Take 10 mg by mouth as needed for allergies.    . Cholecalciferol (VITAMIN D) 50 MCG (2000 UT) tablet Take 2,000 Units by mouth daily.    . clopidogrel (PLAVIX) 75 MG tablet TAKE 1 TABLET BY MOUTH DAILY 90 tablet 1  . diclofenac sodium (VOLTAREN) 1 % GEL Apply 2 g topically 4 (four) times daily. Rub into affected area of foot 2 to 4 times daily 100 g 2  . fluticasone (FLONASE) 50 MCG/ACT nasal spray Place 2 sprays into both nostrils daily. 16 g 2  . montelukast (SINGULAIR) 10 MG tablet TAKE 1 TABLET(10 MG) BY MOUTH DAILY 30 tablet 2  . polyethylene glycol (MIRALAX / GLYCOLAX) 17 g packet Take 17 g by mouth daily. 14 each 0  . rosuvastatin (CRESTOR) 20 MG tablet TAKE 1 TABLET(20 MG) BY MOUTH DAILY 30 tablet 3  . tamsulosin (FLOMAX) 0.4 MG CAPS capsule     . traMADol (ULTRAM) 50 MG tablet Take 1 tablet (50 mg total) by mouth every 6 (six) hours as needed for moderate pain. 30 tablet 0  . zinc gluconate 50 MG tablet Take 50 mg by mouth daily.     No current facility-administered medications for this visit.    Allergies:   Patient has no known allergies.    Social History:  The patient  reports that he has never smoked. He has never used smokeless tobacco. He  reports that he does not drink alcohol and does not use drugs.   Family History:  The patient's family history includes Cancer in his brother; Heart attack in his father; Stroke in his father and mother.    ROS:  General:no colds or fevers, no weight changes Skin:no rashes or ulcers HEENT:no blurred vision, no congestion CV:see HPI PUL:see HPI GI:no diarrhea constipation or melena, no indigestion GU:no hematuri, no dysuria MS:no joint pain, no claudication Neuro:no syncope, no lightheadedness Endo:no diabetes, no thyroid disease  Wt Readings from Last 3 Encounters:  11/08/20 166 lb (75.3 kg)  09/29/20 171 lb (77.6 kg)  09/21/20 171 lb 9.6 oz (77.8 kg)     PHYSICAL EXAM: VS:  BP 126/60   Pulse 64   Ht 5\' 11"  (1.803 m)   Wt 166 lb (75.3 kg)   SpO2 98%   BMI 23.15 kg/m  , BMI Body mass index is 23.15 kg/m. General:Pleasant affect, NAD Skin:Warm and dry, brisk capillary refill HEENT:normocephalic, sclera clear, mucus membranes moist Neck:supple, no JVD, no bruits  Heart:S1S2 RRR without murmur, gallup, rub or click Lungs:clear without rales, rhonchi, or wheezes , non tender, + BS, do not palpate liver spleen or masses Ext:tr lower ext edema, 2+ pedal pulses, 2+ radial pulses, brace on Rt arm and Rt leg Neuro:alert and oriented X 3, MAE, follows commands, + facial symmetry    EKG:  EKG is ordered today. The ekg is not ordered today     Recent Labs: 11/17/2019: B Natriuretic Peptide 2,124.2 11/21/2019: Magnesium 2.7 12/10/2019: TSH 4.160 07/21/2020: ALT 31; BUN 13; Creatinine, Ser 0.93; Hemoglobin 13.9; Platelets 243; Potassium 4.6; Sodium 139    Lipid Panel    Component Value Date/Time   CHOL 145 07/21/2020 1558   TRIG 67 07/21/2020 1558   HDL 63 07/21/2020 1558   CHOLHDL 2.3 07/21/2020 1558   CHOLHDL 2.5 11/18/2019 0222   VLDL 9 11/18/2019 0222   LDLCALC 69 07/21/2020 1558       Other studies Reviewed: Additional studies/ records that were reviewed  today include: .  ECHOCARDIOGRAM 02/2020) IMPRESSIONS  1. Left ventricular ejection fraction, by estimation, is 50 to 55%. The  left ventricle has low normal function. There is moderate asymmetric left  ventricular hypertrophy of the basal-septal segment. Left ventricular  diastolic parameters are consistent  with Grade III diastolic dysfunction (restrictive). Elevated left atrial  pressure.  2. Right ventricular systolic function is mildly reduced. The right  ventricular size is mildly enlarged. There is severely elevated pulmonary  artery systolic pressure. The estimated right ventricular systolic  pressure is 71.7 mmHg.  3. Right atrial size was moderately dilated.  4. The mitral valve is normal in structure. Moderate mitral valve  regurgitation.  5. Tricuspid valve regurgitation is mild to moderate.  6. The aortic valve was not well visualized. Aortic valve regurgitation  is not visualized. No aortic stenosis is present.  7. The inferior vena cava is normal in size with <50% respiratory  variability, suggesting right atrial pressure of 8 mmHg.   Comparison(s): 11/19/19 EF 35-40%.  SLEEPSTUDY 04/2020: After severe pulmonary hypertension identified DIAGNOSIS - Obstructive Sleep Apnea (G47.33) - Central Sleep Apnea (G47.37)  RECOMMENDATIONS - CPAP titration recommended in lab given complex sleep apnea to determine optimal pressure required to alleviate sleep disordered breathing. BiPAP or ASV titration may be required to eliminate central sleep apnea. - Avoid alcohol, sedatives and other CNS depressants that may worsen sleep apnea and disrupt normal sleep architecture. - Sleep hygiene should be reviewed to assess factors that may improve sleep quality. - Weight management and regular exercise should be initiated or continued if appropriate.  NSTEMI (non-ST elevated myocardial infarction) (HCC)    2. S/P CABG x 3   3. OSA (obstructive sleep apnea)   4. Mixed  hyperlipidemia   5. Prediabetes   6. Cerebrovascular accident (CVA) due to stenosis of carotid artery, unspecified blood vessel laterality (HCC)   7. Right bundle branch block   8. Essential hypertension   9. Educated about COVID-19 virus infection     ASSESSMENT AND PLAN:  1.  CAD with hx NSTEMi and CABG X 3  Stable no angina, not on BB HR low 60s, BP stable has been low in past.  Will continue to monitor  Hospital notes reviewed.  Prior OV   2. LV dysfunction with improved EF to normal last year.  3.  Pulmonary HTN will recheck Echo to re-eval now that he has improved. His niece is asking about Rt heart cath. Will re eval on echo first. Then discuss with Dr. Katrinka Blazing.   4. RBBB chronic  5. HTN stable  6. HLD on statin   7. OSA on bipap, follow up this month.   8. Hx CVA stable on plavix for CBA   Follow up with Dr. Katrinka Blazing in 3-4 months    Current medicines are reviewed with the patient today.  The patient Has no concerns regarding medicines.  The following changes have been made:  See above Labs/ tests ordered today include:see above  Disposition:   FU:  see above  Signed, Nada Boozer, NP  11/08/2020 10:14 PM    [facility] [address] [place], [place], [place]  [address]/ [address] [place], [place] Phone: [phone]; Fax: [phone]  [phone]

## 2020-11-08 ENCOUNTER — Ambulatory Visit (INDEPENDENT_AMBULATORY_CARE_PROVIDER_SITE_OTHER): Payer: Medicare PPO | Admitting: Cardiology

## 2020-11-08 ENCOUNTER — Other Ambulatory Visit: Payer: Self-pay

## 2020-11-08 ENCOUNTER — Encounter: Payer: Self-pay | Admitting: Cardiology

## 2020-11-08 VITALS — BP 126/60 | HR 64 | Ht 71.0 in | Wt 166.0 lb

## 2020-11-08 DIAGNOSIS — I519 Heart disease, unspecified: Secondary | ICD-10-CM

## 2020-11-08 DIAGNOSIS — I2510 Atherosclerotic heart disease of native coronary artery without angina pectoris: Secondary | ICD-10-CM

## 2020-11-08 DIAGNOSIS — I272 Pulmonary hypertension, unspecified: Secondary | ICD-10-CM | POA: Diagnosis not present

## 2020-11-08 DIAGNOSIS — I4510 Unspecified right bundle-branch block: Secondary | ICD-10-CM

## 2020-11-08 DIAGNOSIS — E782 Mixed hyperlipidemia: Secondary | ICD-10-CM | POA: Diagnosis not present

## 2020-11-08 DIAGNOSIS — G4733 Obstructive sleep apnea (adult) (pediatric): Secondary | ICD-10-CM | POA: Diagnosis not present

## 2020-11-08 DIAGNOSIS — Z7189 Other specified counseling: Secondary | ICD-10-CM

## 2020-11-08 DIAGNOSIS — Z951 Presence of aortocoronary bypass graft: Secondary | ICD-10-CM

## 2020-11-08 NOTE — Patient Instructions (Signed)
Medication Instructions:  Your physician recommends that you continue on your current medications as directed. Please refer to the Current Medication list given to you today.  *If you need a refill on your cardiac medications before your next appointment, please call your pharmacy*   Lab Work: none If you have labs (blood work) drawn today and your tests are completely normal, you will receive your results only by: [name] MyChart Message (if you have MyChart) OR . A paper copy in the mail If you have any lab test that is abnormal or we need to change your treatment, we will call you to review the results.   Testing/Procedures: Your physician has requested that you have an echocardiogram. Echocardiography is a painless test that uses sound waves to create images of your heart. It provides your doctor with information about the size and shape of your heart and how well your heart's chambers and valves are working. This procedure takes approximately one hour. There are no restrictions for this procedure.     Follow-Up: At [facility], you and your health needs are our priority.  As part of our continuing mission to provide you with exceptional heart care, we have created designated Provider Care Teams.  These Care Teams include your primary Cardiologist (physician) and Advanced Practice Providers (APPs -  Physician Assistants and Nurse Practitioners) who all work together to provide you with the care you need, when you need it.  We recommend signing up for the patient portal called "MyChart".  Sign up information is provided on this After Visit Summary.  MyChart is used to connect with patients for Virtual Visits (Telemedicine).  Patients are able to view lab/test results, encounter notes, upcoming appointments, etc.  Non-urgent messages can be sent to your provider as well.   To learn more about what you can do with MyChart, go to [email or web address].    Your next appointment:   5-6  month(s)  The format for your next appointment:   In Person  Provider:    You may see Lesleigh Noe, MD

## 2020-11-12 DIAGNOSIS — I739 Peripheral vascular disease, unspecified: Secondary | ICD-10-CM | POA: Diagnosis not present

## 2020-11-12 DIAGNOSIS — G4733 Obstructive sleep apnea (adult) (pediatric): Secondary | ICD-10-CM | POA: Diagnosis not present

## 2020-11-21 ENCOUNTER — Other Ambulatory Visit: Payer: Self-pay | Admitting: Nurse Practitioner

## 2020-11-21 DIAGNOSIS — J309 Allergic rhinitis, unspecified: Secondary | ICD-10-CM

## 2020-11-22 ENCOUNTER — Ambulatory Visit: Payer: Medicare PPO | Admitting: Cardiology

## 2020-11-23 ENCOUNTER — Other Ambulatory Visit: Payer: Self-pay | Admitting: Nurse Practitioner

## 2020-11-23 DIAGNOSIS — J309 Allergic rhinitis, unspecified: Secondary | ICD-10-CM

## 2020-11-24 ENCOUNTER — Telehealth: Payer: Self-pay

## 2020-11-24 ENCOUNTER — Other Ambulatory Visit: Payer: Self-pay | Admitting: Nurse Practitioner

## 2020-11-24 DIAGNOSIS — G8191 Hemiplegia, unspecified affecting right dominant side: Secondary | ICD-10-CM

## 2020-11-24 NOTE — Telephone Encounter (Signed)
I have placed the referral

## 2020-11-24 NOTE — Telephone Encounter (Signed)
Niece called asking for a referral for someone to access his brace.

## 2020-12-08 ENCOUNTER — Other Ambulatory Visit: Payer: Self-pay | Admitting: Nurse Practitioner

## 2020-12-08 DIAGNOSIS — G8191 Hemiplegia, unspecified affecting right dominant side: Secondary | ICD-10-CM

## 2020-12-12 ENCOUNTER — Encounter: Payer: Self-pay | Admitting: Physical Medicine & Rehabilitation

## 2020-12-12 DIAGNOSIS — I739 Peripheral vascular disease, unspecified: Secondary | ICD-10-CM | POA: Diagnosis not present

## 2020-12-12 DIAGNOSIS — G4733 Obstructive sleep apnea (adult) (pediatric): Secondary | ICD-10-CM | POA: Diagnosis not present

## 2020-12-16 ENCOUNTER — Ambulatory Visit (HOSPITAL_COMMUNITY): Payer: Medicare PPO | Attending: Cardiovascular Disease

## 2020-12-16 ENCOUNTER — Other Ambulatory Visit: Payer: Self-pay

## 2020-12-16 DIAGNOSIS — I272 Pulmonary hypertension, unspecified: Secondary | ICD-10-CM | POA: Diagnosis not present

## 2020-12-16 LAB — ECHOCARDIOGRAM COMPLETE
Area-P 1/2: 1.81 cm2
S' Lateral: 2.5 cm

## 2020-12-16 MED ORDER — PERFLUTREN LIPID MICROSPHERE
1.0000 mL | INTRAVENOUS | Status: AC | PRN
  Administered 2020-12-16: 1 mL via INTRAVENOUS

## 2020-12-17 ENCOUNTER — Other Ambulatory Visit: Payer: Self-pay | Admitting: Nurse Practitioner

## 2020-12-22 ENCOUNTER — Telehealth: Payer: Self-pay | Admitting: Interventional Cardiology

## 2020-12-22 NOTE — Telephone Encounter (Signed)
Spoke with [name], DPR on file.  Made her aware of results.  [name] appreciative for call.

## 2020-12-22 NOTE — Telephone Encounter (Signed)
Was returning call for results. Please advise

## 2020-12-27 ENCOUNTER — Encounter: Payer: Self-pay | Admitting: Physical Medicine & Rehabilitation

## 2020-12-27 ENCOUNTER — Other Ambulatory Visit: Payer: Self-pay

## 2020-12-27 ENCOUNTER — Encounter: Payer: Medicare PPO | Attending: Physical Medicine & Rehabilitation | Admitting: Physical Medicine & Rehabilitation

## 2020-12-27 VITALS — BP 142/74 | HR 62 | Ht 70.0 in | Wt 172.0 lb

## 2020-12-27 DIAGNOSIS — G8111 Spastic hemiplegia affecting right dominant side: Secondary | ICD-10-CM

## 2020-12-27 NOTE — Patient Instructions (Signed)
CALL IF YOU'D LIKE TO SCHEDULE BOTOX  OnabotulinumtoxinA injection (Medical Use) What is this medicine? ONABOTULINUMTOXINA (o na BOTT you lye num tox in eh) is a neuro-muscular blocker. This medicine is used to treat crossed eyes, eyelid spasms, severe neck muscle spasms, ankle and toe muscle spasms, and elbow, wrist, and finger muscle spasms. It is also used to treat excessive underarm sweating, to prevent chronic migraine headaches, and to treat loss of bladder control due to neurologic conditions such as multiple sclerosis or spinal cord injury. This medicine may be used for other purposes; ask your health care provider or pharmacist if you have questions. COMMON BRAND NAME(S): Botox What should I tell my health care provider before I take this medicine? They need to know if you have any of these conditions:  breathing problems  cerebral palsy spasms  difficulty urinating  heart problems  history of surgery where this medicine is going to be used  infection at the site where this medicine is going to be used  myasthenia gravis or other neurologic disease  nerve or muscle disease  surgery plans  take medicines that treat or prevent blood clots  thyroid problems  an unusual or allergic reaction to botulinum toxin, albumin, other medicines, foods, dyes, or preservatives  pregnant or trying to get pregnant  breast-feeding How should I use this medicine? This medicine is for injection into a muscle. It is given by a health care professional in a hospital or clinic setting. Talk to your pediatrician regarding the use of this medicine in children. While this drug may be prescribed for children as young as 32 years old for selected conditions, precautions do apply. Overdosage: If you think you have taken too much of this medicine contact a poison control center or emergency room at once. NOTE: This medicine is only for you. Do not share this medicine with others. What if I miss a  dose? This does not apply. What may interact with this medicine?  aminoglycoside antibiotics like gentamicin, neomycin, tobramycin  muscle relaxants  other botulinum toxin injections This list may not describe all possible interactions. Give your health care provider a list of all the medicines, herbs, non-prescription drugs, or dietary supplements you use. Also tell them if you smoke, drink alcohol, or use illegal drugs. Some items may interact with your medicine. What should I watch for while using this medicine? Visit your doctor for regular check ups. This medicine will cause weakness in the muscle where it is injected. Tell your doctor if you feel unusually weak in other muscles. Get medical help right away if you have problems with breathing, swallowing, or talking. This medicine might make your eyelids droop or make you see blurry or double. If you have weak muscles or trouble seeing do not drive a car, use machinery, or do other dangerous activities. This medicine contains albumin from human blood. It may be possible to pass an infection in this medicine, but no cases have been reported. Talk to your doctor about the risks and benefits of this medicine. If your activities have been limited by your condition, go back to your regular routine slowly after treatment with this medicine. What side effects may I notice from receiving this medicine? Side effects that you should report to your doctor or health care professional as soon as possible:  allergic reactions like skin rash, itching or hives, swelling of the face, lips, or tongue  breathing problems  changes in vision  chest pain or tightness  eye irritation, pain  fast, irregular heartbeat  infection  numbness  speech problems  swallowing problems  unusual weakness Side effects that usually do not require medical attention (report to your doctor or health care professional if they continue or are  bothersome):  bruising or pain at site where injected  drooping eyelid  dry eyes or mouth  headache  muscles aches, pains  sensitivity to light  tearing This list may not describe all possible side effects. Call your doctor for medical advice about side effects. You may report side effects to FDA at 1-800-FDA-1088. Where should I keep my medicine? This drug is given in a hospital or clinic and will not be stored at home. NOTE: This sheet is a summary. It may not cover all possible information. If you have questions about this medicine, talk to your doctor, pharmacist, or health care provider.  2021 Elsevier/Gold Standard (2018-01-27 14:21:42)

## 2020-12-27 NOTE — Progress Notes (Signed)
Subjective:    Patient ID: [name], male    DOB: 1936-07-12, 85 y.o.   MRN: [id number]  HPI CVA with RIght hemiparesis as well as dysarthria, he has been referred to evaluate whether the current right ankle-foot orthosis is still appropriate.  The patient is accompanied by his niece who notes that his stroke was many years ago and is wondering whether his AFO type is current.  The patient wears his brace every day and in fact does not walk without it.  Was on inpt rehab program post CVA 2003, Left pontine infarct August 2003  RIght AFO , and cane for ambulation in home  Uses motorized WC for long distance Able to climb steps Lives independantly  Some difficulty with applying CPAP Still able to don right double metal upright but with some difficulty Needs to replace shoes soon   No falls at home   Last PT 1 year ago after heart surgery   Pain Inventory Average Pain 0 Pain Right Now 0 My pain is no pain  In the last 24 hours, has pain interfered with the following? General activity 0 Relation with others 0 Enjoyment of life 0 What TIME of day is your pain at its worst? No pain Sleep (in general) Good Pain is worse with: no pain Pain improves with: no pain Relief from Meds: no pain  Family History  Problem Relation Age of Onset  . Stroke Mother   . Stroke Father   . Heart attack Father   . Cancer Brother    Social History   Socioeconomic History  . Marital status: Single    Spouse name: Not on file  . Number of children: Not on file  . Years of education: Not on file  . Highest education level: Not on file  Occupational History  . Occupation: retired  Tobacco Use  . Smoking status: Never Smoker  . Smokeless tobacco: Never Used  Vaping Use  . Vaping Use: Not on file  Substance and Sexual Activity  . Alcohol use: Never  . Drug use: Never  . Sexual activity: Not Currently  Other Topics Concern  . Not on file  Social History Narrative   ** Merged  History Encounter **       Social Determinants of Health   Financial Resource Strain: Low Risk   . Difficulty of Paying Living Expenses: Not hard at all  Food Insecurity: No Food Insecurity  . Worried About Programme researcher, broadcasting/film/video in the Last Year: Never true  . Ran Out of Food in the Last Year: Never true  Transportation Needs: No Transportation Needs  . Lack of Transportation (Medical): No  . Lack of Transportation (Non-Medical): No  Physical Activity: Inactive  . Days of Exercise per Week: 0 days  . Minutes of Exercise per Session: 0 min  Stress: No Stress Concern Present  . Feeling of Stress : Not at all  Social Connections: Not on file   Past Surgical History:  Procedure Laterality Date  . ABDOMINAL AORTOGRAM N/A 11/18/2019   Procedure: ABDOMINAL AORTOGRAM;  Surgeon: Corky Crafts, MD;  Location: [facility] INVASIVE CV LAB;  Service: Cardiovascular;  Laterality: N/A;  . CORONARY ARTERY BYPASS GRAFT N/A 11/20/2019   Procedure: CORONARY ARTERY BYPASS GRAFTING (CABG), ON PUMP, TIMES THREE, USING LEFT INTERNAL MAMMARY ARTERY AND ENDOSCOPICALLY HARVESTED RIGHT GREATER SAPHENOUS VEIN;  Surgeon: Linden Dolin, MD;  Location: [facility] OR;  Service: Open Heart Surgery;  Laterality: N/A;  . LEFT  HEART CATH AND CORONARY ANGIOGRAPHY N/A 11/18/2019   Procedure: LEFT HEART CATH AND CORONARY ANGIOGRAPHY;  Surgeon: Corky Crafts, MD;  Location: [facility] INVASIVE CV LAB;  Service: Cardiovascular;  Laterality: N/A;  . TEE WITHOUT CARDIOVERSION N/A 11/20/2019   Procedure: TRANSESOPHAGEAL ECHOCARDIOGRAM (TEE);  Surgeon: Linden Dolin, MD;  Location: [facility] OR;  Service: Open Heart Surgery;  Laterality: N/A;   Past Surgical History:  Procedure Laterality Date  . ABDOMINAL AORTOGRAM N/A 11/18/2019   Procedure: ABDOMINAL AORTOGRAM;  Surgeon: Corky Crafts, MD;  Location: [facility] INVASIVE CV LAB;  Service: Cardiovascular;  Laterality: N/A;  . CORONARY ARTERY BYPASS GRAFT N/A 11/20/2019   Procedure: CORONARY  ARTERY BYPASS GRAFTING (CABG), ON PUMP, TIMES THREE, USING LEFT INTERNAL MAMMARY ARTERY AND ENDOSCOPICALLY HARVESTED RIGHT GREATER SAPHENOUS VEIN;  Surgeon: Linden Dolin, MD;  Location: [facility] OR;  Service: Open Heart Surgery;  Laterality: N/A;  . LEFT HEART CATH AND CORONARY ANGIOGRAPHY N/A 11/18/2019   Procedure: LEFT HEART CATH AND CORONARY ANGIOGRAPHY;  Surgeon: Corky Crafts, MD;  Location: [facility] INVASIVE CV LAB;  Service: Cardiovascular;  Laterality: N/A;  . TEE WITHOUT CARDIOVERSION N/A 11/20/2019   Procedure: TRANSESOPHAGEAL ECHOCARDIOGRAM (TEE);  Surgeon: Linden Dolin, MD;  Location: [facility] OR;  Service: Open Heart Surgery;  Laterality: N/A;   Past Medical History:  Diagnosis Date  . Arthritis   . Bundle branch block   . Hypercholesteremia   . Hyperlipidemia 2001  . Hypertension 2001  . Hypertension   . Peripheral vascular disease (HCC)   . Stroke (HCC)   . Stroke (HCC)    2001 - R side deficits   BP (!) 142/74   Pulse 62   Ht 5\' 10"  (1.778 m)   Wt 172 lb (78 kg)   SpO2 95%   BMI 24.68 kg/m   Opioid Risk Score:   Fall Risk Score:  `1  Depression screen PHQ 2/9  Depression screen [facility] 2/9 02/18/2020 02/17/2019 11/18/2018 05/15/2018  Decreased Interest 0 0 0 0  Down, Depressed, Hopeless 0 0 0 0  PHQ - 2 Score 0 0 0 0  Altered sleeping - 0 - 0  Tired, decreased energy - 0 - 0  Change in appetite - 0 - 0  Feeling bad or failure about yourself  - 0 - 0  Trouble concentrating - 0 - 0  Moving slowly or fidgety/restless - 0 - 0  Suicidal thoughts - 0 - 0  PHQ-9 Score - 0 - 0    Review of Systems  Constitutional: Positive for unexpected weight change.  HENT: Negative.   Eyes: Negative.   Respiratory: Positive for apnea.   Gastrointestinal: Positive for constipation.  Endocrine: Negative.   Genitourinary: Negative.   Musculoskeletal: Positive for gait problem.  Skin: Negative.   Neurological: Positive for weakness.  Hematological: Bruises/bleeds easily.   Psychiatric/Behavioral: Negative.        Objective:   Physical Exam Vitals and nursing note reviewed.  Constitutional:      Appearance: He is normal weight.  HENT:     Head: Normocephalic and atraumatic.  Eyes:     Extraocular Movements: Extraocular movements intact.     Conjunctiva/sclera: Conjunctivae normal.     Pupils: Pupils are equal, round, and reactive to light.  Cardiovascular:     Rate and Rhythm: Normal rate and regular rhythm.     Heart sounds: Normal heart sounds. No murmur heard.   Pulmonary:     Effort: Pulmonary effort is normal. No respiratory  distress.     Breath sounds: Normal breath sounds. No stridor.  Abdominal:     General: Abdomen is flat. Bowel sounds are normal. There is no distension.     Palpations: Abdomen is soft. There is no mass.  Musculoskeletal:     Cervical back: No rigidity.     Comments: Pt with RIght elbow flexor contracture , RIght elbow fixed at 90 degrees,   Right ankle contracture vs tone  Skin:    General: Skin is warm and dry.  Neurological:     Mental Status: He is alert and oriented to person, place, and time.     Comments: Motor Trace Right finger flexors, 0/5 at R elbow flexors and ext was well as wrist flexors and ext  Right lower ext 4- R HF, KE, 0/5 Right ankle DF and PF  ToneRight elbow flexors MAS 4 Right wrist flexors MAS 3 RIght finger flexors  MAS 2 RIght hamstring MAS1 Right ankle plantar flexors MAS 3 RIght ankle/foot inverters MAS 3   Psychiatric:        Mood and Affect: Mood normal.        Behavior: Behavior normal.    Gait with current double metal upright dual channel AFO with T strap- good foot clearance and knee control       Assessment & Plan:  1.  Left pontine infarct with Right spastic hemiparesis His current double metal upright AFO is effective and pt will not ambulate with out it.  While there are currently lighter carbon fiber alternatives , it is difficult to say if pt with accept the  difference.  Will see pt in f/u 1 mo  2.  Spasticity Right dominant, esp RIght wrist flexors and RIght tib posterior   Would recommend botox 50 U RIght FCR 50U RIght FCU and 100U Right tib post- niece will discuss this with pt as he is not too keen on "shots"

## 2021-01-04 ENCOUNTER — Other Ambulatory Visit: Payer: Self-pay | Admitting: Nurse Practitioner

## 2021-01-12 DIAGNOSIS — I739 Peripheral vascular disease, unspecified: Secondary | ICD-10-CM | POA: Diagnosis not present

## 2021-01-12 DIAGNOSIS — G4733 Obstructive sleep apnea (adult) (pediatric): Secondary | ICD-10-CM | POA: Diagnosis not present

## 2021-01-13 ENCOUNTER — Other Ambulatory Visit: Payer: Self-pay

## 2021-01-13 ENCOUNTER — Ambulatory Visit: Payer: Medicare PPO | Admitting: Occupational Therapy

## 2021-01-13 ENCOUNTER — Ambulatory Visit: Payer: Medicare PPO | Attending: Nurse Practitioner

## 2021-01-13 ENCOUNTER — Encounter: Payer: Self-pay | Admitting: Occupational Therapy

## 2021-01-13 DIAGNOSIS — M25611 Stiffness of right shoulder, not elsewhere classified: Secondary | ICD-10-CM | POA: Insufficient documentation

## 2021-01-13 DIAGNOSIS — I69351 Hemiplegia and hemiparesis following cerebral infarction affecting right dominant side: Secondary | ICD-10-CM | POA: Diagnosis not present

## 2021-01-13 DIAGNOSIS — M25632 Stiffness of left wrist, not elsewhere classified: Secondary | ICD-10-CM

## 2021-01-13 DIAGNOSIS — R2689 Other abnormalities of gait and mobility: Secondary | ICD-10-CM

## 2021-01-13 DIAGNOSIS — M25621 Stiffness of right elbow, not elsewhere classified: Secondary | ICD-10-CM | POA: Diagnosis not present

## 2021-01-13 DIAGNOSIS — M6281 Muscle weakness (generalized): Secondary | ICD-10-CM | POA: Diagnosis not present

## 2021-01-13 DIAGNOSIS — R278 Other lack of coordination: Secondary | ICD-10-CM | POA: Insufficient documentation

## 2021-01-13 DIAGNOSIS — R2681 Unsteadiness on feet: Secondary | ICD-10-CM | POA: Diagnosis not present

## 2021-01-13 NOTE — Therapy (Signed)
[facility] [address] [place], [place], [address] Phone: [phone]   Fax:  [phone]  Occupational Therapy Evaluation  Patient Details  Name: [name] MRN: [id number] Date of Birth: March 07, 1936 Referring Provider (OT): Claudette Laws, MD   Encounter Date: 01/13/2021   OT End of Session - 01/13/21 1050     Visit Number 1    Number of Visits 9    Date for OT Re-Evaluation 03/10/21    Authorization Type [employer]    Authorization Time Period $20 copay- Auth Req'd    OT Start Time 0930    OT Stop Time 1015    OT Time Calculation (min) 45 min    Activity Tolerance Patient tolerated treatment well    Behavior During Therapy [facility] for tasks assessed/performed             Past Medical History:  Diagnosis Date   Arthritis    Bundle branch block    Hypercholesteremia    Hyperlipidemia 2001   Hypertension 2001   Hypertension    Peripheral vascular disease (HCC)    Stroke (HCC)    Stroke (HCC)    2001 - R side deficits    Past Surgical History:  Procedure Laterality Date   ABDOMINAL AORTOGRAM N/A 11/18/2019   Procedure: ABDOMINAL AORTOGRAM;  Surgeon: Corky Crafts, MD;  Location: [facility] INVASIVE CV LAB;  Service: Cardiovascular;  Laterality: N/A;   CORONARY ARTERY BYPASS GRAFT N/A 11/20/2019   Procedure: CORONARY ARTERY BYPASS GRAFTING (CABG), ON PUMP, TIMES THREE, USING LEFT INTERNAL MAMMARY ARTERY AND ENDOSCOPICALLY HARVESTED RIGHT GREATER SAPHENOUS VEIN;  Surgeon: Linden Dolin, MD;  Location: [facility] OR;  Service: Open Heart Surgery;  Laterality: N/A;   LEFT HEART CATH AND CORONARY ANGIOGRAPHY N/A 11/18/2019   Procedure: LEFT HEART CATH AND CORONARY ANGIOGRAPHY;  Surgeon: Corky Crafts, MD;  Location: [facility] INVASIVE CV LAB;  Service: Cardiovascular;  Laterality: N/A;   TEE WITHOUT CARDIOVERSION N/A 11/20/2019   Procedure: TRANSESOPHAGEAL ECHOCARDIOGRAM (TEE);  Surgeon: Linden Dolin,  MD;  Location: [facility] OR;  Service: Open Heart Surgery;  Laterality: N/A;    There were no vitals filed for this visit.   Subjective Assessment - 01/13/21 1051     Subjective  Pt accompanied by neice, [name]. Pt's niece spoke for him the majority of the session d/t patient being hard of hearing and inability to read lips d/t the masks. Pt has stroke in 2003 and is living independently but demonstrates significant spasticity and posturing in RUE. Pt wears a standard off the shelf wrist brace at all times.    Patient is accompanied by: Family member   niece, [name]   Limitations Hard of Hearing. Reads Lips. No Driving. Fall Risk    Patient Stated Goals Pt's niece reported coming for AFO consult and for discussing RUE functional use    Currently in Pain? No/denies    Pain Score 0-No pain               [facility] OT Assessment - 01/13/21 0936       Assessment   Medical Diagnosis Hx of CVA 2003 - spastic RUE    Referring Provider (OT) Claudette Laws, MD    Onset Date/Surgical Date --   stroke in 2003   Hand Dominance Right      Precautions   Precautions Fall    Required Braces or Orthoses Other Brace/Splint    Other Brace/Splint AFO and wrists brace  Balance Screen   Has the patient fallen in the past 6 months No      Home  Environment   Family/patient expects to be discharged to: Private residence    Living Arrangements Alone    Available Help at Discharge Family    Type of Home House    Home Layout One level    Bathroom Shower/Tub Walk-in [employer] Shower seat;Cane - single point;Electric scooter    Lives With Alone      Prior Function   Level of Independence Independent with basic ADLs    Vocation Retired    Leisure garden, window shop, exercise, socialize, loves to decorate      IADL   Light Housekeeping Performs light daily tasks but cannot maintain acceptable level of cleanliness   family assists   Engineer, drilling on family or friends for  transportation      Written Expression   Dominant Hand Right      Vision - History   Baseline Vision Wears glasses only for reading      Sensation   Light Touch Appears Intact      Coordination   Box and Blocks R - 3, L - 44      Tone   Assessment Location Right Upper Extremity      ROM / Strength   AROM / PROM / Strength PROM      PROM   Overall PROM  Deficits    PROM Assessment Site Shoulder;Elbow;Wrist    Right/Left Shoulder Right    Right Shoulder Flexion 40 Degrees    Right Shoulder ABduction 40 Degrees    Right/Left Elbow Right    Right Elbow Extension -105    Right/Left Wrist Right    Right Wrist Extension 0 Degrees    Right Wrist Flexion 30 Degrees      Hand Function   Right Hand Gross Grasp Impaired   able to get passive composite extension - postures in MCP and wrist Flex   Right Hand Grip (lbs) 6.1    Left Hand Gross Grasp Functional    Left Hand Grip (lbs) 53.3      RUE Tone   RUE Tone Modified Ashworth;Hypertonic;Severe      RUE Tone   Hypertonic Details RUE in flexor synergy    Modified Ashworth Scale for Grading Hypertonia RUE Affected part(s) rigid in flexion or extension                               OT Short Term Goals - 01/13/21 1209       OT SHORT TERM GOAL #1   Title Pt and/or caregiver will be independent with HEP for stretching for RUE    Time 4    Period Weeks    Status New    Target Date 02/10/21      OT SHORT TERM GOAL #2   Title Pt will demonstrate ability to don cpap mask with mod I    Time 4    Period Weeks    Status New      OT SHORT TERM GOAL #3   Title Pt will demonstrate ability to don and doff any splints PRN    Time 4    Period Weeks    Status New      OT SHORT TERM GOAL #4   Title Pt will demonstrate improved passive elbow extension of -100 degrees or  greater.    Baseline -105*    Time 4    Period Weeks    Status New               OT Long Term Goals - 01/13/21 1220       OT  LONG TERM GOAL #1   Title Pt and caregiver will be independent with wear and care of splints PRN    Time 8    Period Weeks    Status New    Target Date 03/10/21      OT LONG TERM GOAL #2   Title Pt will increase passive range of motion of elbow extension to -95 degrees or greater    Baseline -105*    Time 8    Period Weeks    Status New      OT LONG TERM GOAL #3   Title Pt will increase functional use of RUE by performing box and blocks with score of 6 or greater with RUE.    Baseline 3 blocks with R    Time 8    Period Weeks    Status New      OT LONG TERM GOAL #4   Title Pt will improve passive shoulder flexion to 60* or greater in order in RUE    Baseline 40*    Time 8    Period Weeks    Status New      OT LONG TERM GOAL #5   Title Pt will report using RUE for stabilization and assisting with functional tasks at least 50% of the day.    Time 8    Period Weeks    Status New                   Plan - 01/13/21 1004     Clinical Impression Statement Pt presents to Neuro OPOT with spastic hemiplegia s/p CVA circa 2003. Pt has severe hypertonicity and spasticity with Modified Ashworth score of 4. Pt able to be passively stretched with limited ROM. Pt presents with signficant limitations with ROM and with overall decreased functional use of RUE impeding independent with ADLs and IADLs. Skilled occupational therapy is recommended to target listed areas of deficit, possible fabrication of splints for maintaining range of motion and decreasing skin breakdown and increasing independence with ADLs and IADLs.    OT Occupational Profile and History Problem Focused Assessment - Including review of records relating to presenting problem    Occupational performance deficits (Please refer to evaluation for details): IADL's;ADL's;Leisure    Body Structure / Function / Physical Skills Tone;UE functional use;Flexibility;Mobility;Coordination;Strength;ROM;ADL;IADL;Dexterity;Decreased  knowledge of use of DME    Rehab Potential Fair   late effects of CVA   Clinical Decision Making Limited treatment options, no task modification necessary    Comorbidities Affecting Occupational Performance: None    Modification or Assistance to Complete Evaluation  No modification of tasks or assist necessary to complete eval    OT Frequency 1x / week    OT Duration 8 weeks    OT Treatment/Interventions Self-care/ADL training;Fluidtherapy;Moist Heat;DME and/or AE instruction;Splinting;Patient/family education;Therapeutic activities;Therapeutic exercise;Neuromuscular education;Passive range of motion;Paraffin;Manual Therapy    Plan splinting - resting hand splint and/or wrist cock up for day time, if brings in CPAP mask assess independence with donning and doffing    Consulted and Agree with Plan of Care Patient;Family member/caregiver    Family Member Consulted niece, [name]             Patient  will benefit from skilled therapeutic intervention in order to improve the following deficits and impairments:   Body Structure / Function / Physical Skills: Tone, UE functional use, Flexibility, Mobility, Coordination, Strength, ROM, ADL, IADL, Dexterity, Decreased knowledge of use of DME       Visit Diagnosis: Spastic hemiplegia of right dominant side as late effect of cerebral infarction (HCC)  Stiffness of left wrist, not elsewhere classified  Stiffness of right elbow, not elsewhere classified  Other lack of coordination  Muscle weakness (generalized)  Stiffness of right shoulder, not elsewhere classified    Problem List Patient Active Problem List   Diagnosis Date Noted   Atherosclerotic heart disease of native coronary artery without angina pectoris 08/01/2020   Cerebrovascular accident (HCC) 08/01/2020   Constipation 08/01/2020   Mixed hyperlipidemia 08/01/2020   Personal history of colonic polyps 08/01/2020   Protein-calorie malnutrition, severe 12/02/2019   Paroxysmal  atrial fibrillation (HCC)    Hx of CABG 11/20/2019   Pressure injury of skin 11/18/2019   NSTEMI (non-ST elevated myocardial infarction) (HCC) 11/17/2019   Elevated troponin 11/03/2019   PVD (peripheral vascular disease) (HCC) 11/03/2019   History of CVA with residual deficit 11/03/2019   Elevated CK 11/03/2019   Fall at home, initial encounter 11/03/2019   Prediabetes 11/03/2019   Essential hypertension 11/03/2019   PAD (peripheral artery disease) (HCC) 05/11/2019    Junious Dresser MOT, OTR/L  01/13/2021, 12:31 PM  [facility] [address] [place], [place], [address] Phone: [phone]   Fax:  [phone]  Name: [name] MRN: [id number] Date of Birth: May 10, 1936

## 2021-01-15 NOTE — Therapy (Signed)
[facility] [address] [place], [place], [address] Phone: [phone]   Fax:  [phone]  Physical Therapy Evaluation  Patient Details  Name: [name] MRN: [id number] Date of Birth: 05-14-1936 Referring Provider (PT): Dr. Wynn Banker   Encounter Date: 01/13/2021    Past Medical History:  Diagnosis Date   Arthritis    Bundle branch block    Hypercholesteremia    Hyperlipidemia 2001   Hypertension 2001   Hypertension    Peripheral vascular disease (HCC)    Stroke [facility])    Stroke (HCC)    2001 - R side deficits    Past Surgical History:  Procedure Laterality Date   ABDOMINAL AORTOGRAM N/A 11/18/2019   Procedure: ABDOMINAL AORTOGRAM;  Surgeon: Corky Crafts, MD;  Location: [facility] INVASIVE CV LAB;  Service: Cardiovascular;  Laterality: N/A;   CORONARY ARTERY BYPASS GRAFT N/A 11/20/2019   Procedure: CORONARY ARTERY BYPASS GRAFTING (CABG), ON PUMP, TIMES THREE, USING LEFT INTERNAL MAMMARY ARTERY AND ENDOSCOPICALLY HARVESTED RIGHT GREATER SAPHENOUS VEIN;  Surgeon: Linden Dolin, MD;  [facility]: [facility] OR;  Service: Open Heart Surgery;  Laterality: N/A;   LEFT HEART CATH AND CORONARY ANGIOGRAPHY N/A 11/18/2019   Procedure: LEFT HEART CATH AND CORONARY ANGIOGRAPHY;  Surgeon: Corky Crafts, MD;  Location: [facility] INVASIVE CV LAB;  Service: Cardiovascular;  Laterality: N/A;   TEE WITHOUT CARDIOVERSION N/A 11/20/2019   Procedure: TRANSESOPHAGEAL ECHOCARDIOGRAM (TEE);  Surgeon: Linden Dolin, MD;  Location: [facility] OR;  Service: Open Heart Surgery;  Laterality: N/A;    There were no vitals filed for this visit.    01/13/21 0001  Assessment  Medical Diagnosis CVA with R side weakness  Referring Provider (PT) Dr. Wynn Banker  Hand Dominance Right  Prior Therapy OPPT  Precautions  Precautions Fall  Required Braces or Orthoses Other Brace/Splint  Other Brace/Splint AFO and wrists brace  Home Environment  Living  Environment Private residence  Living Arrangements Alone  Available Help at Discharge Family  Type of Home House  Home Access Stairs to enter;Ramped entrance  Entrance Stairs-Number of Steps 3  Prior Function  Level of Independence Independent with basic ADLs  Vocation Retired  IT trainer to [employer] 4: Min guard  Five time sit to stand comments  26.6s  Comments with original AFO  Ambulation/Gait  Ambulation/Gait Yes  Ambulation/Gait Assistance 5: Supervision;4: Min guard  Ambulation Distance (Feet) 125 Feet  Assistive device Other (Comment)  Ambulation Surface Level;Indoor  Gait velocity 0.32m/s  Gait Comments R upright AFO                    Objective measurements completed on examination: See above findings.               PT Education - 01/15/21 0708     Education Details Patient and niece educated on eval findings, rehab potential and POC and both are in agreement    Person(s) Educated Patient;Caregiver(s)    Methods Explanation    Comprehension Verbalized understanding              PT Short Term Goals - 01/15/21 0743       PT SHORT TERM GOAL #1   Title Patient to become I in HEP    Baseline TBD    Time 5    Period Weeks    Status New    Target Date 02/19/21      PT SHORT TERM GOAL #2   Title Assess  DGI and set appropriate goal    Baseline TBD    Time 4    Period Weeks    Status New    Target Date 02/19/21      PT SHORT TERM GOAL #3   Title Patient to ambulate 254ft with R AFO and S    Baseline 156ft with R AFO and SBA    Time 4    Period Weeks    Status New    Target Date 02/19/21      PT SHORT TERM GOAL #4   Title Obtain R AFO consult    Baseline TBD    Time 4    Period Weeks    Status New    Target Date 02/19/21               PT Long Term Goals - 01/15/21 0750       PT LONG TERM GOAL #1   Title Increase gait speed to 0.3 m/s with R AFO and no AD    Baseline 0.21 m/s gait speed    Time 8    Period  Weeks    Status New    Target Date 03/19/21      PT LONG TERM GOAL #2   Title Patient able to perfom 5x STS in 20s    Baseline 5x STS in 26.6s    Time 8    Period Weeks    Status New    Target Date 03/19/21      PT LONG TERM GOAL #3   Title Patient to ambulate 577ft with R AFO and SBA    Baseline 15ft with R AFO and SBA    Time 8    Period Weeks    Status New    Target Date 03/19/21      PT LONG TERM GOAL #4   Title Assess progress towards DGI    Baseline TBD    Time 8    Period Weeks    Status New    Target Date 03/19/21                    Plan - 01/15/21 0998     Clinical Impression Statement Patient presents with decreased functional mobility due to underlying CVA which has left him with R sided hemiparesis with need of R AFO to ambulate.  Patient is able to transfer with SBA and ambulate with CGA 124ft in clininc w/o AD in R AFO.  Patient presents with functional R hip and knee strength for transfers and ambulation. Niece presesent during assessmen and questions answered    Personal Factors and Comorbidities Age;Comorbidity 1;Time since onset of injury/illness/exacerbation    Examination-Activity Limitations Stand;Stairs;Locomotion Level    Examination-Participation Restrictions Shop    Stability/Clinical Decision Making Stable/Uncomplicated    Clinical Decision Making Low    Rehab Potential Good    PT Frequency 1x / week    PT Duration 8 weeks    PT Treatment/Interventions ADLs/Self Care Home Management;DME Instruction;Gait training;Stair training;Functional mobility training;Therapeutic activities;Therapeutic exercise;Balance training;Neuromuscular re-education;Patient/family education;Orthotic Fit/Training    PT Next Visit Plan Assess TUG, DGI and gait speed    PT Home Exercise Plan TBD    Consulted and Agree with Plan of Care Patient;Family member/caregiver             Patient will benefit from skilled therapeutic intervention in order to improve  the following deficits and impairments:  Abnormal gait, Decreased endurance, Impaired tone, Decreased activity tolerance, Decreased  strength, Decreased balance, Decreased mobility, Difficulty walking, Increased muscle spasms  Visit Diagnosis: Unsteadiness on feet  Other abnormalities of gait and mobility  Hemiplegia and hemiparesis following cerebral infarction affecting right dominant side [facility])     Problem List Patient Active Problem List   Diagnosis Date Noted   Atherosclerotic heart disease of native coronary artery without angina pectoris 08/01/2020   Cerebrovascular accident [facility]) 08/01/2020   Constipation 08/01/2020   Mixed hyperlipidemia 08/01/2020   Personal history of colonic polyps 08/01/2020   Protein-calorie malnutrition, severe 12/02/2019   Paroxysmal atrial fibrillation (HCC)    Hx of CABG 11/20/2019   Pressure injury of skin 11/18/2019   NSTEMI (non-ST elevated myocardial infarction) (HCC) 11/17/2019   Elevated troponin 11/03/2019   PVD (peripheral vascular disease) (HCC) 11/03/2019   History of CVA with residual deficit 11/03/2019   Elevated CK 11/03/2019   Fall at home, initial encounter 11/03/2019   Prediabetes 11/03/2019   Essential hypertension 11/03/2019   PAD (peripheral artery disease) (HCC) 05/11/2019    Hildred Laser PT 01/15/2021, 7:54 AM  [facility] [address] [place], [place], [address] Phone: [phone]   Fax:  [phone]  Name: TOMAS SCHAMP MRN: [id number] Date of Birth: [birth year]

## 2021-01-19 ENCOUNTER — Ambulatory Visit (HOSPITAL_COMMUNITY)
Admission: RE | Admit: 2021-01-19 | Discharge: 2021-01-19 | Disposition: A | Discharge status: Home / Self Care | Payer: Medicare PPO | Source: Ambulatory Visit | Attending: Vascular Surgery | Admitting: Vascular Surgery

## 2021-01-19 ENCOUNTER — Encounter: Payer: Self-pay | Admitting: Vascular Surgery

## 2021-01-19 ENCOUNTER — Other Ambulatory Visit: Payer: Self-pay

## 2021-01-19 ENCOUNTER — Ambulatory Visit (INDEPENDENT_AMBULATORY_CARE_PROVIDER_SITE_OTHER): Payer: Medicare PPO | Admitting: Vascular Surgery

## 2021-01-19 ENCOUNTER — Ambulatory Visit (INDEPENDENT_AMBULATORY_CARE_PROVIDER_SITE_OTHER)
Admission: RE | Admit: 2021-01-19 | Discharge: 2021-01-19 | Disposition: A | Discharge status: Home / Self Care | Payer: Medicare PPO | Source: Ambulatory Visit | Attending: Vascular Surgery | Admitting: Vascular Surgery

## 2021-01-19 VITALS — BP 134/70 | HR 49 | Temp 97.9°F | Resp 20 | Ht 70.0 in | Wt 172.0 lb

## 2021-01-19 DIAGNOSIS — I739 Peripheral vascular disease, unspecified: Secondary | ICD-10-CM | POA: Diagnosis not present

## 2021-01-19 DIAGNOSIS — I6523 Occlusion and stenosis of bilateral carotid arteries: Secondary | ICD-10-CM | POA: Insufficient documentation

## 2021-01-19 NOTE — Progress Notes (Signed)
Patient is an 85 year old male who returns for follow-up today.  He recently had a left pretibial hematoma which has now completely resolved.  He has resumed his Plavix.  He has no rest pain in the foot.  He is able to ambulate with a cane.  He does not have claudication.  Previous arteriogram in April 2021 by Dr. Eldridge Dace showed occlusion of the right common iliac artery and a 50% narrowing of the left external iliac artery.  He has also had several carotid duplex scans in the past which did not really show any significant disease.  Review of systems: He has no shortness of breath.  He has no chest pain.  He does have decreased energy level.  Past Medical History:  Diagnosis Date   Arthritis    Bundle branch block    Hypercholesteremia    Hyperlipidemia 2001   Hypertension 2001   Hypertension    Peripheral vascular disease (HCC)    Stroke [facility])    Stroke (HCC)    2001 - R side deficits    Past Surgical History:  Procedure Laterality Date   ABDOMINAL AORTOGRAM N/A 11/18/2019   Procedure: ABDOMINAL AORTOGRAM;  Surgeon: Corky Crafts, MD;  Location: [facility] INVASIVE CV LAB;  Service: Cardiovascular;  Laterality: N/A;   CORONARY ARTERY BYPASS GRAFT N/A 11/20/2019   Procedure: CORONARY ARTERY BYPASS GRAFTING (CABG), ON PUMP, TIMES THREE, USING LEFT INTERNAL MAMMARY ARTERY AND ENDOSCOPICALLY HARVESTED RIGHT GREATER SAPHENOUS VEIN;  Surgeon: Linden Dolin, MD;  Location: [facility] OR;  Service: Open Heart Surgery;  Laterality: N/A;   LEFT HEART CATH AND CORONARY ANGIOGRAPHY N/A 11/18/2019   Procedure: LEFT HEART CATH AND CORONARY ANGIOGRAPHY;  Surgeon: Corky Crafts, MD;  Location: [facility] INVASIVE CV LAB;  Service: Cardiovascular;  Laterality: N/A;   TEE WITHOUT CARDIOVERSION N/A 11/20/2019   Procedure: TRANSESOPHAGEAL ECHOCARDIOGRAM (TEE);  Surgeon: Linden Dolin, MD;  Location: [facility] OR;  Service: Open Heart Surgery;  Laterality: N/A;    Current Outpatient Medications on File Prior to Visit   Medication Sig Dispense Refill   acetaminophen (TYLENOL) 325 MG tablet Take 2 tablets (650 mg total) by mouth every 4 (four) hours as needed for headache or mild pain.     aspirin EC 81 MG tablet Take 81 mg by mouth daily.     cetirizine (ZYRTEC) 10 MG tablet Take 10 mg by mouth as needed for allergies.     Cholecalciferol (VITAMIN D) 50 MCG (2000 UT) tablet Take 2,000 Units by mouth daily.     clopidogrel (PLAVIX) 75 MG tablet TAKE 1 TABLET BY MOUTH DAILY 90 tablet 1   diclofenac sodium (VOLTAREN) 1 % GEL Apply 2 g topically 4 (four) times daily. Rub into affected area of foot 2 to 4 times daily 100 g 2   fluticasone (FLONASE) 50 MCG/ACT nasal spray Place 2 sprays into both nostrils daily. 16 g 2   montelukast (SINGULAIR) 10 MG tablet TAKE 1 TABLET(10 MG) BY MOUTH DAILY 30 tablet 2   polyethylene glycol (MIRALAX / GLYCOLAX) 17 g packet Take 17 g by mouth daily. 14 each 0   rosuvastatin (CRESTOR) 20 MG tablet TAKE 1 TABLET(20 MG) BY MOUTH DAILY 30 tablet 3   tamsulosin (FLOMAX) 0.4 MG CAPS capsule      traMADol (ULTRAM) 50 MG tablet Take 1 tablet (50 mg total) by mouth every 6 (six) hours as needed for moderate pain. 30 tablet 0   zinc gluconate 50 MG tablet Take 50 mg by mouth  daily.     No current facility-administered medications on file prior to visit.    Physical exam:  Vitals:   01/19/21 1039 01/19/21 1042  BP: 137/74 134/70  Pulse: (!) 49   Resp: 20   Temp: 97.9 F (36.6 C)   SpO2: 96%   Weight: 172 lb (78 kg)   Height: 5\' 10"  (1.778 m)     Extremities: No right femoral or pedal pulses, left femoral pulse 2+ 2+ left dorsalis pedis pulse  Skin: No open ulcer or wound  Data: Patient had a carotid duplex exam today which showed no significant stenosis.  He also had bilateral ABIs today which were 0.92 on the left 2.6 on the right essentially unchanged from 1 year ago.  Assessment: #1 no significant carotid occlusive disease #2 peripheral arterial disease stable over the  last year essentially asymptomatic  Plan: Patient will have follow-up ABIs in 1 year.  He will return sooner if he develops any wounds on his legs or worsening symptoms.  I do not believe he needs further carotid duplex scan at this point as he has no significant disease and is currently 85 years old.  91, MD [facility] Office: [phone]   Assessment:

## 2021-01-20 ENCOUNTER — Ambulatory Visit: Payer: Medicare PPO | Admitting: Physical Therapy

## 2021-01-20 ENCOUNTER — Ambulatory Visit: Payer: Medicare PPO | Admitting: Occupational Therapy

## 2021-01-20 ENCOUNTER — Encounter: Payer: Self-pay | Admitting: Physical Therapy

## 2021-01-20 DIAGNOSIS — R2681 Unsteadiness on feet: Secondary | ICD-10-CM

## 2021-01-20 DIAGNOSIS — M6281 Muscle weakness (generalized): Secondary | ICD-10-CM | POA: Diagnosis not present

## 2021-01-20 DIAGNOSIS — R2689 Other abnormalities of gait and mobility: Secondary | ICD-10-CM

## 2021-01-20 DIAGNOSIS — I69351 Hemiplegia and hemiparesis following cerebral infarction affecting right dominant side: Secondary | ICD-10-CM

## 2021-01-20 DIAGNOSIS — R278 Other lack of coordination: Secondary | ICD-10-CM | POA: Diagnosis not present

## 2021-01-20 DIAGNOSIS — M25632 Stiffness of left wrist, not elsewhere classified: Secondary | ICD-10-CM | POA: Diagnosis not present

## 2021-01-20 DIAGNOSIS — M25611 Stiffness of right shoulder, not elsewhere classified: Secondary | ICD-10-CM

## 2021-01-20 DIAGNOSIS — M25621 Stiffness of right elbow, not elsewhere classified: Secondary | ICD-10-CM | POA: Diagnosis not present

## 2021-01-20 NOTE — Therapy (Signed)
[facility] [address] [place], [place], [address] Phone: [phone]   Fax:  [phone]  Occupational Therapy Treatment  Patient Details  Name: [name] MRN: [id number] Date of Birth: Apr 06, 1936 Referring Provider (OT): Claudette Laws, MD   Encounter Date: 01/20/2021   OT End of Session - 01/20/21 0845     Visit Number 2    Number of Visits 9    Date for OT Re-Evaluation 03/10/21    Authorization Type [employer]    Authorization Time Period $20 copay- Auth Req'd    OT Start Time 0808    OT Stop Time 0846    OT Time Calculation (min) 38 min    Activity Tolerance Patient tolerated treatment well    Behavior During Therapy [facility] for tasks assessed/performed             Past Medical History:  Diagnosis Date   Arthritis    Bundle branch block    Hypercholesteremia    Hyperlipidemia 2001   Hypertension 2001   Hypertension    Peripheral vascular disease (HCC)    Stroke (HCC)    Stroke (HCC)    2001 - R side deficits    Past Surgical History:  Procedure Laterality Date   ABDOMINAL AORTOGRAM N/A 11/18/2019   Procedure: ABDOMINAL AORTOGRAM;  Surgeon: Corky Crafts, MD;  Location: [facility] INVASIVE CV LAB;  Service: Cardiovascular;  Laterality: N/A;   CORONARY ARTERY BYPASS GRAFT N/A 11/20/2019   Procedure: CORONARY ARTERY BYPASS GRAFTING (CABG), ON PUMP, TIMES THREE, USING LEFT INTERNAL MAMMARY ARTERY AND ENDOSCOPICALLY HARVESTED RIGHT GREATER SAPHENOUS VEIN;  Surgeon: Linden Dolin, MD;  Location: [facility] OR;  Service: Open Heart Surgery;  Laterality: N/A;   LEFT HEART CATH AND CORONARY ANGIOGRAPHY N/A 11/18/2019   Procedure: LEFT HEART CATH AND CORONARY ANGIOGRAPHY;  Surgeon: Corky Crafts, MD;  Location: [facility] INVASIVE CV LAB;  Service: Cardiovascular;  Laterality: N/A;   TEE WITHOUT CARDIOVERSION N/A 11/20/2019   Procedure: TRANSESOPHAGEAL ECHOCARDIOGRAM (TEE);  Surgeon: Linden Dolin,  MD;  Location: [facility] OR;  Service: Open Heart Surgery;  Laterality: N/A;    There were no vitals filed for this visit.   Subjective Assessment - 01/20/21 0812     Subjective  Pt reports no pain.    Patient is accompanied by: --   not present at session   Limitations Hard of Hearing. Reads Lips. No Driving. Fall Risk    Patient Stated Goals Pt's niece reported coming for AFO consult and for discussing RUE functional use    Currently in Pain? No/denies             Manual Therapy passive range of motion for RUE for elbow extension, shoulder abduction, wrist extension and extension of digits. Pt with significant spasticity and  tone in RUE. Considering botox to RUE per Dr. Wynn Banker.  Supine Cane limited d/t spasticity but patient able to do elbow extension/flexion in mid ranges actively  CPAP mask - ADL donned and doffed CPAP mask x 2 with supervision on second attempt. Pt was removing all velcro and instructed to only take off the magnetic parts for easier donning and doffing. Continue to assess.                       OT Short Term Goals - 01/20/21 0817       OT SHORT TERM GOAL #1   Title Pt and/or caregiver will be independent with HEP  for stretching for RUE    Time 4    Period Weeks    Status New    Target Date 02/10/21      OT SHORT TERM GOAL #2   Title Pt will demonstrate ability to don cpap mask with mod I    Time 4    Period Weeks    Status On-going      OT SHORT TERM GOAL #3   Title Pt will demonstrate ability to don and doff any splints PRN    Time 4    Period Weeks    Status New      OT SHORT TERM GOAL #4   Title Pt will demonstrate improved passive elbow extension of -100 degrees or greater.    Baseline -105*    Time 4    Period Weeks    Status New               OT Long Term Goals - 01/13/21 1220       OT LONG TERM GOAL #1   Title Pt and caregiver will be independent with wear and care of splints PRN    Time 8    Period Weeks     Status New    Target Date 03/10/21      OT LONG TERM GOAL #2   Title Pt will increase passive range of motion of elbow extension to -95 degrees or greater    Baseline -105*    Time 8    Period Weeks    Status New      OT LONG TERM GOAL #3   Title Pt will increase functional use of RUE by performing box and blocks with score of 6 or greater with RUE.    Baseline 3 blocks with R    Time 8    Period Weeks    Status New      OT LONG TERM GOAL #4   Title Pt will improve passive shoulder flexion to 60* or greater in order in RUE    Baseline 40*    Time 8    Period Weeks    Status New      OT LONG TERM GOAL #5   Title Pt will report using RUE for stabilization and assisting with functional tasks at least 50% of the day.    Time 8    Period Weeks    Status New                   Plan - 01/20/21 1124     Clinical Impression Statement Pt progressing towards goals. Pt benefitting from passive range of motion for increased ROM and inhibition of tone. Continue progressing towards goals.    OT Occupational Profile and History Problem Focused Assessment - Including review of records relating to presenting problem    Occupational performance deficits (Please refer to evaluation for details): IADL's;ADL's;Leisure    Body Structure / Function / Physical Skills Tone;UE functional use;Flexibility;Mobility;Coordination;Strength;ROM;ADL;IADL;Dexterity;Decreased knowledge of use of DME    Rehab Potential Fair   late effects of CVA   Clinical Decision Making Limited treatment options, no task modification necessary    Comorbidities Affecting Occupational Performance: None    Modification or Assistance to Complete Evaluation  No modification of tasks or assist necessary to complete eval    OT Frequency 1x / week    OT Duration 8 weeks    OT Treatment/Interventions Self-care/ADL training;Fluidtherapy;Moist Heat;DME and/or AE instruction;Splinting;Patient/family education;Therapeutic  activities;Therapeutic exercise;Neuromuscular education;Passive  range of motion;Paraffin;Manual Therapy    Plan splinting - resting hand splint and/or wrist cock up for day time, if brings in CPAP mask assess independence with donning and doffing    Consulted and Agree with Plan of Care Patient;Family member/caregiver    Family Member Consulted niece, [name]             Patient will benefit from skilled therapeutic intervention in order to improve the following deficits and impairments:   Body Structure / Function / Physical Skills: Tone, UE functional use, Flexibility, Mobility, Coordination, Strength, ROM, ADL, IADL, Dexterity, Decreased knowledge of use of DME       Visit Diagnosis: Spastic hemiplegia of right dominant side as late effect of cerebral infarction (HCC)  Stiffness of right elbow, not elsewhere classified  Other lack of coordination  Stiffness of right shoulder, not elsewhere classified  Muscle weakness (generalized)  Stiffness of left wrist, not elsewhere classified  Unsteadiness on feet    Problem List Patient Active Problem List   Diagnosis Date Noted   Atherosclerotic heart disease of native coronary artery without angina pectoris 08/01/2020   Cerebrovascular accident (HCC) 08/01/2020   Constipation 08/01/2020   Mixed hyperlipidemia 08/01/2020   Personal history of colonic polyps 08/01/2020   Protein-calorie malnutrition, severe 12/02/2019   Paroxysmal atrial fibrillation (HCC)    Hx of CABG 11/20/2019   Pressure injury of skin 11/18/2019   NSTEMI (non-ST elevated myocardial infarction) (HCC) 11/17/2019   Elevated troponin 11/03/2019   PVD (peripheral vascular disease) (HCC) 11/03/2019   History of CVA with residual deficit 11/03/2019   Elevated CK 11/03/2019   Fall at home, initial encounter 11/03/2019   Prediabetes 11/03/2019   Essential hypertension 11/03/2019   PAD (peripheral artery disease) (HCC) 05/11/2019    Junious Dresser  MOT, OTR/L  01/20/2021, 11:26 AM  [facility] [address] [place], [place], [address] Phone: [phone]   Fax:  [phone]  Name: [name] MRN: [id number] Date of Birth: 03/07/1936

## 2021-01-22 NOTE — Therapy (Signed)
[facility] [address] [place], [place], [address] Phone: [phone]   Fax:  [phone]  Physical Therapy Treatment  Patient Details  Name: [name] MRN: [id number] Date of Birth: [birth year] Referring Provider (PT): Dr. Letta Pate   Encounter Date: 01/20/2021    01/20/21 0847  PT Visits / Re-Eval  Visit Number 2  Number of Visits 9  Date for PT Re-Evaluation 03/19/21  Authorization  Authorization Type [employer]  Authorization Time Period 01/13/21  Progress Note Due on Visit 9  PT Time Calculation  PT Start Time 1540  PT Stop Time 0930  PT Time Calculation (min) 43 min  PT - End of Session  Equipment Utilized During Treatment Gait belt  Activity Tolerance Patient tolerated treatment well  Behavior During Therapy [facility] for tasks assessed/performed    Past Medical History:  Diagnosis Date   Arthritis    Bundle branch block    Hypercholesteremia    Hyperlipidemia 2001   Hypertension 2001   Hypertension    Peripheral vascular disease ([place])    Stroke ([place])    Stroke ([place])    2001 - R side deficits    Past Surgical History:  Procedure Laterality Date   ABDOMINAL AORTOGRAM N/A 11/18/2019   Procedure: ABDOMINAL AORTOGRAM;  Surgeon: Jettie Booze, MD;  Location: [place] [facility];  Service: Cardiovascular;  Laterality: N/A;   CORONARY ARTERY BYPASS GRAFT N/A 11/20/2019   Procedure: CORONARY ARTERY BYPASS GRAFTING (CABG), ON PUMP, TIMES THREE, USING LEFT INTERNAL MAMMARY ARTERY AND ENDOSCOPICALLY HARVESTED RIGHT GREATER SAPHENOUS VEIN;  Surgeon: Wonda Olds, MD;  Location: [facility];  Service: Open Heart Surgery;  Laterality: N/A;   LEFT HEART CATH AND CORONARY ANGIOGRAPHY N/A 11/18/2019   Procedure: LEFT HEART CATH AND CORONARY ANGIOGRAPHY;  Surgeon: Jettie Booze, MD;  Location: [place] [facility];  Service: Cardiovascular;  Laterality: N/A;   TEE WITHOUT CARDIOVERSION N/A 11/20/2019    Procedure: TRANSESOPHAGEAL ECHOCARDIOGRAM (TEE);  Surgeon: Wonda Olds, MD;  Location: [place];  Service: Open Heart Surgery;  Laterality: N/A;    There were no vitals filed for this visit.    01/20/21 0846  Symptoms/Limitations  Subjective No new complaints. No falls or pain to report.  Pertinent History 85 y/o male , lives home alone (niece next door), presented s/p fall at home (3rd this year). PMHx includes: arthritis, R bundle branch block and bradycardia, HTN, PVD, CVA w/ R side weakness. In ED had contusion of B shoulders and R hip, signs of aortic and corinary artherosclerotic disease. Pt admitted with elevated troponin.  Limitations Sitting  How long can you sit comfortably? unlimited  How long can you stand comfortably? <15 min  How long can you walk comfortably? <15 min  Patient Stated Goals To be able to function better and obtain an new AFO  Pain Assessment  Currently in Pain? No/denies  Pain Score 0        01/20/21 0856  Standardized Balance Assessment  Standardized Balance Assessment TUG;Dynamic Gait Index;10 meter walk test  10 Meter Walk 19.50 sec's= 1.68 ft/sec with cane  Dynamic Gait Index  Level Surface 2  Change in Gait Speed 2  Gait with Horizontal Head Turns 1  Gait with Vertical Head Turns 1  Gait and Pivot Turn 1  Step Over Obstacle 0  Step Around Obstacles 1  Steps 1  Total Score 9  DGI comment: increased difficulty with following directiosn due to [facility] and unable to  read PTA's lips at all times with testing  Timed Up and Go Test  TUG Normal TUG  Normal TUG (seconds) 23 (with cane, min guard assist)        01/20/21 0856  Transfers  Transfers Sit to Stand;Stand to Sit  Sit to Stand 4: Min guard;With upper extremity assist;From bed;From chair/3-in-1  Stand to Sit 4: Min guard;With upper extremity assist;To bed;To chair/3-in-1  Ambulation/Gait  Ambulation/Gait Yes  Ambulation/Gait Assistance 5: Supervision  Ambulation Distance (Feet)  115 Feet (x2, plus around clinic with session)  Assistive device Straight cane (right)  Gait Pattern Step-to pattern;Step-through pattern;Decreased stride length;Decreased step length - right;Decreased stance time - left;Decreased hip/knee flexion - right;Right steppage;Decreased trunk rotation;Decreased arm swing - right  Ambulation Surface Level;Indoor  Stairs Yes  Stairs Assistance 4: Min guard  Stairs Assistance Details (indicate cue type and reason) pt ascends with left foot leading, and descends with left foot leading in partial sideways position so he can brace his body against the rail. States this is how he manages stairs at home.  Stair Management Technique One rail Right;One rail Left;Step to pattern;Forwards  Number of Stairs 4  Height of Stairs 6          PT Short Term Goals - 01/20/21 2158       PT SHORT TERM GOAL #1   Title Patient to become I in HEP (all STGs due 02/19/21)    Baseline TBD    Time 5    Period Weeks    Status On-going    Target Date 02/19/21      PT SHORT TERM GOAL #2   Title Assess DGI and set appropriate goal    Baseline 01/20/21: 9/24 with cane/AFO    Time 4    Period Weeks    Status Partially Met    Target Date 02/19/21      PT SHORT TERM GOAL #3   Title Patient to ambulate 252f with R AFO and S    Baseline 1227fwith R AFO and SBA    Time 4    Period Weeks    Status On-going    Target Date 02/19/21      PT SHORT TERM GOAL #4   Title Obtain R AFO consult    Baseline TBD    Time 4    Period Weeks    Status On-going    Target Date 02/19/21               PT Long Term Goals - 01/15/21 0750       PT LONG TERM GOAL #1   Title Increase gait speed to 0.3 m/s with R AFO and no AD    Baseline 0.21 m/s gait speed    Time 8    Period Weeks    Status New    Target Date 03/19/21      PT LONG TERM GOAL #2   Title Patient able to perfom 5x STS in 20s    Baseline 5x STS in 26.6s    Time 8    Period Weeks    Status New     Target Date 03/19/21      PT LONG TERM GOAL #3   Title Patient to ambulate 50080fith R AFO and SBA    Baseline 125f65fth R AFO and SBA    Time 8    Period Weeks    Status New    Target Date 03/19/21      PT  LONG TERM GOAL #4   Title Assess progress towards DGI    Baseline TBD    Time 8    Period Weeks    Status New    Target Date 03/19/21               01/20/21 0855  Plan  Clinical Impression Statement Today's skilled session focused on establishing baseline score for 10 meter gait speed, Dynamic Gait Index and Timed Up and Go with primary PT to update goals based on scores obtained today. The pt is progressing and should benefit from continued PT to progress toward unmet goals.  Personal Factors and Comorbidities Age;Comorbidity 1;Time since onset of injury/illness/exacerbation  Examination-Activity Limitations Stand;Stairs;Locomotion Level  Examination-Participation Restrictions Shop  Pt will benefit from skilled therapeutic intervention in order to improve on the following deficits Abnormal gait;Decreased endurance;Impaired tone;Decreased activity tolerance;Decreased strength;Decreased balance;Decreased mobility;Difficulty walking;Increased muscle spasms  Stability/Clinical Decision Making Stable/Uncomplicated  Rehab Potential Good  PT Frequency 1x / week  PT Duration 8 weeks  PT Treatment/Interventions ADLs/Self Care Home Management;DME Instruction;Gait training;Stair training;Functional mobility training;Therapeutic activities;Therapeutic exercise;Balance training;Neuromuscular re-education;Patient/family education;Orthotic Fit/Training  PT Next Visit Plan set up HEP for stretching, strengthening and balance; begin to trial other braces  PT Home Exercise Plan TBD  Consulted and Agree with Plan of Care Patient;Family member/caregiver      Patient will benefit from skilled therapeutic intervention in order to improve the following deficits and impairments:  Abnormal  gait, Decreased endurance, Impaired tone, Decreased activity tolerance, Decreased strength, Decreased balance, Decreased mobility, Difficulty walking, Increased muscle spasms  Visit Diagnosis: Unsteadiness on feet  Other abnormalities of gait and mobility  Hemiplegia and hemiparesis following cerebral infarction affecting right dominant side [facility]     Problem List Patient Active Problem List   Diagnosis Date Noted   Atherosclerotic heart disease of native coronary artery without angina pectoris 08/01/2020   Cerebrovascular accident [facility]) 08/01/2020   Constipation 08/01/2020   Mixed hyperlipidemia 08/01/2020   Personal history of colonic polyps 08/01/2020   Protein-calorie malnutrition, severe 12/02/2019   Paroxysmal atrial fibrillation ([facility])    Hx of CABG 11/20/2019   Pressure injury of skin 11/18/2019   NSTEMI (non-ST elevated myocardial infarction) ([place]) 11/17/2019   Elevated troponin 11/03/2019   PVD (peripheral vascular disease) ([place]) 11/03/2019   History of CVA with residual deficit 11/03/2019   Elevated CK 11/03/2019   Fall at home, initial encounter 11/03/2019   Prediabetes 11/03/2019   Essential hypertension 11/03/2019   PAD (peripheral artery disease) ([place]) 05/11/2019    Willow Ora, PTA, [facility] [address], [place], [place] [address] [phone] 01/22/21, 10:00 PM    Name: [name] MRN: [id number] Date of Birth: 1936-02-28

## 2021-01-27 ENCOUNTER — Ambulatory Visit: Payer: Medicare PPO

## 2021-01-27 ENCOUNTER — Ambulatory Visit: Payer: Medicare PPO | Admitting: Occupational Therapy

## 2021-01-27 ENCOUNTER — Other Ambulatory Visit: Payer: Self-pay

## 2021-01-27 DIAGNOSIS — R278 Other lack of coordination: Secondary | ICD-10-CM

## 2021-01-27 DIAGNOSIS — I69351 Hemiplegia and hemiparesis following cerebral infarction affecting right dominant side: Secondary | ICD-10-CM | POA: Diagnosis not present

## 2021-01-27 DIAGNOSIS — M25621 Stiffness of right elbow, not elsewhere classified: Secondary | ICD-10-CM

## 2021-01-27 DIAGNOSIS — M25632 Stiffness of left wrist, not elsewhere classified: Secondary | ICD-10-CM | POA: Diagnosis not present

## 2021-01-27 DIAGNOSIS — M6281 Muscle weakness (generalized): Secondary | ICD-10-CM | POA: Diagnosis not present

## 2021-01-27 DIAGNOSIS — R2681 Unsteadiness on feet: Secondary | ICD-10-CM

## 2021-01-27 DIAGNOSIS — M25611 Stiffness of right shoulder, not elsewhere classified: Secondary | ICD-10-CM | POA: Diagnosis not present

## 2021-01-27 DIAGNOSIS — R2689 Other abnormalities of gait and mobility: Secondary | ICD-10-CM | POA: Diagnosis not present

## 2021-01-27 NOTE — Therapy (Signed)
[facility] [address] [place], [place], [address] Phone: [phone]   Fax:  [phone]  Occupational Therapy Treatment  Patient Details  Name: [name] MRN: [id number] Date of Birth: Jan 02, 1936 Referring Provider (OT): Claudette Laws, MD   Encounter Date: 01/27/2021   OT End of Session - 01/27/21 1424     Visit Number 3    Number of Visits 9    Date for OT Re-Evaluation 03/10/21    Authorization Type [employer]    Authorization Time Period $20 copay- Auth Req'd    OT Start Time 1233    OT Stop Time 1315    OT Time Calculation (min) 42 min             Past Medical History:  Diagnosis Date   Arthritis    Bundle branch block    Hypercholesteremia    Hyperlipidemia 2001   Hypertension 2001   Hypertension    Peripheral vascular disease ([facility])    Stroke ([facility])    Stroke ([facility])    2001 - R side deficits    Past Surgical History:  Procedure Laterality Date   ABDOMINAL AORTOGRAM N/A 11/18/2019   Procedure: ABDOMINAL AORTOGRAM;  Surgeon: Corky Crafts, MD;  Location: [facility] IN[facility];  Service: Cardiovascular;  Laterality: N/A;   CORONARY ARTERY BYPASS GRAFT N/A 11/20/2019   Procedure: CORONARY ARTERY BYPASS GRAFTING (CABG), ON PUMP, TIMES THREE, USING LEFT INTERNAL MAMMARY ARTERY AND ENDOSCOPICALLY HARVESTED RIGHT GREATER SAPHENOUS VEIN;  Surgeon: Linden Dolin, MD;  Location: [facility];  Service: Open Heart Surgery;  Laterality: N/A;   LEFT HEART CATH AND CORONARY ANGIOGRAPHY N/A 11/18/2019   Procedure: LEFT HEART CATH AND CORONARY ANGIOGRAPHY;  Surgeon: Corky Crafts, MD;  Location: [facility] INVASIVE CV LAB;  Service: Cardiovascular;  Laterality: N/A;   TEE WITHOUT CARDIOVERSION N/A 11/20/2019   Procedure: TRANSESOPHAGEAL ECHOCARDIOGRAM (TEE);  Surgeon: Linden Dolin, MD;  Location: [facility] OR;  Service: Open Heart Surgery;  Laterality: N/A;    There were no vitals filed for this  visit.   Subjective Assessment - 01/27/21 1424     Subjective  Denies pain    Limitations Hard of Hearing. Reads Lips. No Driving. Fall Risk    Patient Stated Goals Pt's niece reported coming for AFO consult and for discussing RUE functional use    Currently in Pain? No/denies                     Treatment: Passive ROM to elbow in flexion/ extension and wrist.  Pt performed self ROM elbow extension reach for floor and able and sliding cane along knees for chest press. Pt stood at elevated tabletop with wrist brace off to perform grasp / release of 1 inch blocks min-mod difficulty due to elbow spasticity and ROM limitations. Therapist spoke with pt's niece, Olegario Messier, and let her know that pt can remove wrist brace for functional activities, however he should wear brace several hours per day to prevent wrist flexion contracture. (Pt's niece  [name] left a message for therapist to call, Olegario Messier to pass on message.). Therapist initiated fabrication and fitting of resting hand splint, however did not complete due to time constraints and additional time required due to spasticity. Splint may need additional fitting next visit for improved fit(second set of hands will likely be necessary due to spasticity).               OT Short Term Goals -  01/20/21 0817       OT SHORT TERM GOAL #1   Title Pt and/or caregiver will be independent with HEP for stretching for RUE    Time 4    Period Weeks    Status New    Target Date 02/10/21      OT SHORT TERM GOAL #2   Title Pt will demonstrate ability to don cpap mask with mod I    Time 4    Period Weeks    Status On-going      OT SHORT TERM GOAL #3   Title Pt will demonstrate ability to don and doff any splints PRN    Time 4    Period Weeks    Status New      OT SHORT TERM GOAL #4   Title Pt will demonstrate improved passive elbow extension of -100 degrees or greater.    Baseline -105*    Time 4    Period Weeks    Status New                OT Long Term Goals - 01/13/21 1220       OT LONG TERM GOAL #1   Title Pt and caregiver will be independent with wear and care of splints PRN    Time 8    Period Weeks    Status New    Target Date 03/10/21      OT LONG TERM GOAL #2   Title Pt will increase passive range of motion of elbow extension to -95 degrees or greater    Baseline -105*    Time 8    Period Weeks    Status New      OT LONG TERM GOAL #3   Title Pt will increase functional use of RUE by performing box and blocks with score of 6 or greater with RUE.    Baseline 3 blocks with R    Time 8    Period Weeks    Status New      OT LONG TERM GOAL #4   Title Pt will improve passive shoulder flexion to 60* or greater in order in RUE    Baseline 40*    Time 8    Period Weeks    Status New      OT LONG TERM GOAL #5   Title Pt will report using RUE for stabilization and assisting with functional tasks at least 50% of the day.    Time 8    Period Weeks    Status New                   Plan - 01/27/21 1424     Clinical Impression Statement Pt progressing  slowly towards goals. He demonstrates significant limitations in ROM and may benefit from botox.    OT Occupational Profile and History Problem Focused Assessment - Including review of records relating to presenting problem    Occupational performance deficits (Please refer to evaluation for details): IADL's;ADL's;Leisure    Body Structure / Function / Physical Skills Tone;UE functional use;Flexibility;Mobility;Coordination;Strength;ROM;ADL;IADL;Dexterity;Decreased knowledge of use of DME    Rehab Potential Fair   late effects of CVA   Clinical Decision Making Limited treatment options, no task modification necessary    Comorbidities Affecting Occupational Performance: None    Modification or Assistance to Complete Evaluation  No modification of tasks or assist necessary to complete eval    OT Frequency 1x / week    OT  Duration 8  weeks    OT Treatment/Interventions Self-care/ADL training;Fluidtherapy;Moist Heat;DME and/or AE instruction;Splinting;Patient/family education;Therapeutic activities;Therapeutic exercise;Neuromuscular education;Passive range of motion;Paraffin;Manual Therapy    Plan Resting hand splint initiated, continue fabrication/ fitting, pt will likely require caregiver assist for application. CPAP mask assess independence with donning and doffing    Consulted and Agree with Plan of Care Patient             Patient will benefit from skilled therapeutic intervention in order to improve the following deficits and impairments:   Body Structure / Function / Physical Skills: Tone, UE functional use, Flexibility, Mobility, Coordination, Strength, ROM, ADL, IADL, Dexterity, Decreased knowledge of use of DME       Visit Diagnosis: No diagnosis found.    Problem List Patient Active Problem List   Diagnosis Date Noted   Atherosclerotic heart disease of native coronary artery without angina pectoris 08/01/2020   Cerebrovascular accident [facility]) 08/01/2020   Constipation 08/01/2020   Mixed hyperlipidemia 08/01/2020   Personal history of colonic polyps 08/01/2020   Protein-calorie malnutrition, severe 12/02/2019   Paroxysmal atrial fibrillation ([facility])    Hx of CABG 11/20/2019   Pressure injury of skin 11/18/2019   NSTEMI (non-ST elevated myocardial infarction) ([facility]) 11/17/2019   Elevated troponin 11/03/2019   PVD (peripheral vascular disease) ([facility]) 11/03/2019   History of CVA with residual deficit 11/03/2019   Elevated CK 11/03/2019   Fall at home, initial encounter 11/03/2019   Prediabetes 11/03/2019   Essential hypertension 11/03/2019   PAD (peripheral artery disease) ([facility]) 05/11/2019    Harbour Nordmeyer 01/27/2021, 2:26 PM  [facility] [address] [place], [place], [address] Phone: [phone]   Fax:  [phone]  Name: MACYN REMMERT MRN: [id number] Date of Birth: 1935/12/23

## 2021-01-27 NOTE — Patient Instructions (Signed)
Access Code: [email or web address] URL: [email or web address] Date: 01/27/2021 Prepared by: Gustavus Bryant  Exercises Standing March with Counter Support - 2 x daily - 7 x weekly - 1 sets - 10 reps Side Stepping with Counter Support - 2 x daily - 7 x weekly - 1 sets - 10 reps

## 2021-01-27 NOTE — Therapy (Signed)
[facility] [address] [place], [place], [address] Phone: [phone]   Fax:  [phone]  Physical Therapy Treatment  Patient Details  Name: [name] MRN: [id number] Date of Birth: 12-23-1935 Referring Provider (PT): Dr. Letta Pate   Encounter Date: 01/27/2021   PT End of Session - 01/27/21 1315     Visit Number 3    Number of Visits 9    Date for PT Re-Evaluation 03/19/21    Authorization Type [employer]    Authorization Time Period 01/13/21    Progress Note Due on Visit 9    PT Start Time 1315    PT Stop Time 1400    PT Time Calculation (min) 45 min    Equipment Utilized During Treatment Gait belt    Activity Tolerance Patient tolerated treatment well    Behavior During Therapy [facility] for tasks assessed/performed             Past Medical History:  Diagnosis Date   Arthritis    Bundle branch block    Hypercholesteremia    Hyperlipidemia 2001   Hypertension 2001   Hypertension    Peripheral vascular disease ([place])    Stroke ([place])    Stroke ([place])    2001 - R side deficits    Past Surgical History:  Procedure Laterality Date   ABDOMINAL AORTOGRAM N/A 11/18/2019   Procedure: ABDOMINAL AORTOGRAM;  Surgeon: Jettie Booze, MD;  Location: [facility];  Service: Cardiovascular;  Laterality: N/A;   CORONARY ARTERY BYPASS GRAFT N/A 11/20/2019   Procedure: CORONARY ARTERY BYPASS GRAFTING (CABG), ON PUMP, TIMES THREE, USING LEFT INTERNAL MAMMARY ARTERY AND ENDOSCOPICALLY HARVESTED RIGHT GREATER SAPHENOUS VEIN;  Surgeon: Wonda Olds, MD;  Location: [facility];  Service: Open Heart Surgery;  Laterality: N/A;   LEFT HEART CATH AND CORONARY ANGIOGRAPHY N/A 11/18/2019   Procedure: LEFT HEART CATH AND CORONARY ANGIOGRAPHY;  Surgeon: Jettie Booze, MD;  Location: [place] [facility];  Service: Cardiovascular;  Laterality: N/A;   TEE WITHOUT CARDIOVERSION N/A 11/20/2019   Procedure: TRANSESOPHAGEAL  ECHOCARDIOGRAM (TEE);  Surgeon: Wonda Olds, MD;  Location: [facility];  Service: Open Heart Surgery;  Laterality: N/A;    There were no vitals filed for this visit.   Subjective Assessment - 01/27/21 1630     Subjective No new complaints. No falls or pain to report.  Anxious to get new brace    Patient is accompained by: Family member    Pertinent History 85 y/o male , lives home alone (niece next door), presented s/p fall at home (3rd this year). PMHx includes: arthritis, R bundle branch block and bradycardia, HTN, PVD, CVA w/ R side weakness. In ED had contusion of B shoulders and R hip, signs of aortic and corinary artherosclerotic disease. Pt admitted with elevated troponin.    Limitations Sitting    How long can you sit comfortably? unlimited    How long can you stand comfortably? <15 min    How long can you walk comfortably? <15 min    Patient Stated Goals To be able to function better and obtain an new AFO            2 minute duration PROM R hamstring stretch    [facility] Adult PT Treatment/Exercise - 01/27/21 0001       Lumbar Exercises: Supine   Bridge 10 reps;Limitations    Bridge Limitations 2x10      Knee/Hip Exercises: Seated   Long  Arc Quad 2 sets;10 reps;Both    Long Arc Quad Limitations alt.    Marching Both;2 sets;10 reps    Marching Limitations alt.      Knee/Hip Exercises: Supine   Other Supine Knee/Hip Exercises marching, alt. 2x10    Other Supine Knee/Hip Exercises B hip fallouts 2x10                      PT Short Term Goals - 01/27/21 1346       PT SHORT TERM GOAL #1   Title Patient to become I in HEP (all STGs due 02/19/21)    Baseline TBD;    Time 5    Period Weeks    Status Achieved    Target Date 02/19/21      PT SHORT TERM GOAL #2   Title Assess DGI and set appropriate goal; 01/27/21 Goal is 15/24    Baseline 01/20/21: 9/24 with cane/AFO    Time 4    Period Weeks    Status Partially Met    Target Date 02/19/21      PT  SHORT TERM GOAL #3   Title Patient to ambulate 272ft with R AFO and S    Baseline 163ft with R AFO and SBA    Time 4    Period Weeks    Status On-Nelson    Target Date 02/19/21      PT SHORT TERM GOAL #4   Title Obtain R AFO consult    Baseline TBD; 01/27/21 Spoke with [facility], AFO consult in progess    Time 4    Period Weeks    Status Achieved    Target Date 02/19/21               PT Long Term Goals - 01/15/21 0750       PT LONG TERM GOAL #1   Title Increase gait speed to 0.3 m/s with R AFO and no AD    Baseline 0.21 m/s gait speed    Time 8    Period Weeks    Status New    Target Date 03/19/21      PT LONG TERM GOAL #2   Title Patient able to perfom 5x STS in 20s    Baseline 5x STS in 26.6s    Time 8    Period Weeks    Status New    Target Date 03/19/21      PT LONG TERM GOAL #3   Title Patient to ambulate 576ft with R AFO and SBA    Baseline 157ft with R AFO and SBA    Time 8    Period Weeks    Status New    Target Date 03/19/21      PT LONG TERM GOAL #4   Title Assess progress towards DGI    Baseline TBD    Time 8    Period Weeks    Status New    Target Date 03/19/21                   Plan - 01/27/21 1355     Clinical Impression Statement todays session focused on LE sterngthening, R hamstring stertch prior to exercises, gait training and establisment of initial HEP, several rest breaks required as well as extra time to coomunicate due to [facility]    Personal Factors and Comorbidities Age;Comorbidity 1;Time since onset of injury/illness/exacerbation    Examination-Activity Limitations Stand;Stairs;Locomotion Level    Examination-Participation Restrictions  Shop    Stability/Clinical Decision Making Stable/Uncomplicated    Rehab Potential Good    PT Frequency 1x / week    PT Duration 8 weeks    PT Treatment/Interventions ADLs/Self Care Home Management;DME Instruction;Gait training;Stair training;Functional mobility training;Therapeutic  activities;Therapeutic exercise;Balance training;Neuromuscular re-education;Patient/family education;Orthotic Fit/Training    PT Next Visit Plan set up HEP for stretching, strengthening and balance; begin to trial other braces    PT Home Exercise Plan [id number]    Consulted and Agree with Plan of Care Patient;Family member/caregiver             Patient will benefit from skilled therapeutic intervention in order to improve the following deficits and impairments:  Abnormal gait, Decreased endurance, Impaired tone, Decreased activity tolerance, Decreased strength, Decreased balance, Decreased mobility, Difficulty walking, Increased muscle spasms  Visit Diagnosis: Spastic hemiplegia of right dominant side as late effect of cerebral infarction (HCC)  Muscle weakness (generalized)  Unsteadiness on feet     Problem List Patient Active Problem List   Diagnosis Date Noted   Atherosclerotic heart disease of native coronary artery without angina pectoris 08/01/2020   Cerebrovascular accident ([place]) 08/01/2020   Constipation 08/01/2020   Mixed hyperlipidemia 08/01/2020   Personal history of colonic polyps 08/01/2020   Protein-calorie malnutrition, severe 12/02/2019   Paroxysmal atrial fibrillation (HCC)    Hx of CABG 11/20/2019   Pressure injury of skin 11/18/2019   NSTEMI (non-ST elevated myocardial infarction) ([place]) 11/17/2019   Elevated troponin 11/03/2019   PVD (peripheral vascular disease) ([place]) 11/03/2019   History of CVA with residual deficit 11/03/2019   Elevated CK 11/03/2019   Fall at home, initial encounter 11/03/2019   Prediabetes 11/03/2019   Essential hypertension 11/03/2019   PAD (peripheral artery disease) ([place]) 05/11/2019    Lanice Shirts PT 01/27/2021, 4:31 PM  [facility] [address] [place], [place], [address] Phone: [phone]   Fax:  [phone]  Name: [name] MRN: [id number] Date  of Birth: 09-27-1935

## 2021-02-03 ENCOUNTER — Ambulatory Visit: Payer: Medicare PPO | Admitting: Occupational Therapy

## 2021-02-03 ENCOUNTER — Other Ambulatory Visit: Payer: Self-pay

## 2021-02-03 ENCOUNTER — Ambulatory Visit: Payer: Medicare PPO | Attending: Nurse Practitioner

## 2021-02-03 ENCOUNTER — Encounter: Payer: Self-pay | Admitting: Occupational Therapy

## 2021-02-03 ENCOUNTER — Telehealth: Payer: Self-pay | Admitting: Physical Medicine & Rehabilitation

## 2021-02-03 DIAGNOSIS — M25621 Stiffness of right elbow, not elsewhere classified: Secondary | ICD-10-CM | POA: Insufficient documentation

## 2021-02-03 DIAGNOSIS — R2689 Other abnormalities of gait and mobility: Secondary | ICD-10-CM | POA: Diagnosis not present

## 2021-02-03 DIAGNOSIS — I69351 Hemiplegia and hemiparesis following cerebral infarction affecting right dominant side: Secondary | ICD-10-CM

## 2021-02-03 DIAGNOSIS — M25632 Stiffness of left wrist, not elsewhere classified: Secondary | ICD-10-CM | POA: Insufficient documentation

## 2021-02-03 DIAGNOSIS — R278 Other lack of coordination: Secondary | ICD-10-CM | POA: Diagnosis not present

## 2021-02-03 DIAGNOSIS — M25611 Stiffness of right shoulder, not elsewhere classified: Secondary | ICD-10-CM | POA: Insufficient documentation

## 2021-02-03 DIAGNOSIS — M6281 Muscle weakness (generalized): Secondary | ICD-10-CM | POA: Diagnosis not present

## 2021-02-03 DIAGNOSIS — R2681 Unsteadiness on feet: Secondary | ICD-10-CM | POA: Diagnosis not present

## 2021-02-03 NOTE — Telephone Encounter (Signed)
Per Dr Wynn Banker: Pt will have orthotist at [facility] clinic to see pt while he is in PT to try different types of braces.  Once that has been done and recommendations made, I can write Rx for new AFO

## 2021-02-03 NOTE — Patient Instructions (Signed)
Your Splint This splint should initially be fitted by a healthcare practitioner.  The healthcare practitioner is responsible for providing wearing instructions and precautions to the patient, other healthcare practitioners and care provider involved in the patient's care.  This splint was custom made for you. Please read the following instructions to learn about wearing and caring for your splint.  Precautions Should your splint cause any of the following problems, remove the splint immediately and contact your therapist/physician. Swelling Severe Pain Pressure Areas Stiffness Numbness  Do not wear your splint while operating machinery unless it has been fabricated for that purpose.  When To Wear Your Splint Where your splint according to your therapist/physician instructions. TO START: wear for 2 hours at a time. If you tolerate 2 hours at a time, move to 3 hours at a time and so on... keep going until you can tolerate at least 5 hours at a time.   The end goal is wearing the splint AT NIGHT TIME for prolonged stretch and keeping hand and wrist in good functional position  Nights and rest periods only  Care and Cleaning of Your Splint Keep your splint away from open flames. Your splint will lose its shape in temperatures over 135 degrees Farenheit, ( in car windows, near radiators, ovens or in hot water).  Never make any adjustments to your splint, if the splint needs adjusting remove it and make an appointment to see your therapist. Your splint, including the cushion liner may be cleaned with soap and lukewarm water.  Do not immerse in hot water over 135 degrees Farenheit. Straps may be washed with soap and water, but do not moisten the self-adhesive portion. For ink or hard to remove spots use a scouring cleanser which contains chlorine.  Rinse the splint thoroughly after using chlorine cleanser.

## 2021-02-03 NOTE — Telephone Encounter (Signed)
[name]  niece says she was told that it not how they work. [facility] insists they need the order from Dr Wynn Banker before they can see the patient.  The do not go to physical therapy site.

## 2021-02-03 NOTE — Therapy (Signed)
[facility] [address] [place], [place], [address] Phone: [phone]   Fax:  [phone]  Occupational Therapy Treatment  Patient Details  Name: [name] MRN: [id number] Date of Birth: [birth year] Referring Provider (OT): Claudette Laws, MD   Encounter Date: 02/03/2021   OT End of Session - 02/03/21 0901     Visit Number 4    Number of Visits 9    Date for OT Re-Evaluation 03/10/21    Authorization Type [employer]    Authorization Time Period $20 copay- Auth Req'd    OT Start Time 0848    OT Stop Time 0930    OT Time Calculation (min) 42 min    Activity Tolerance Patient tolerated treatment well    Behavior During Therapy [facility] for tasks assessed/performed             Past Medical History:  Diagnosis Date   Arthritis    Bundle branch block    Hypercholesteremia    Hyperlipidemia 2001   Hypertension 2001   Hypertension    Peripheral vascular disease (HCC)    Stroke (HCC)    Stroke (HCC)    2001 - R side deficits    Past Surgical History:  Procedure Laterality Date   ABDOMINAL AORTOGRAM N/A 11/18/2019   Procedure: ABDOMINAL AORTOGRAM;  Surgeon: Corky Crafts, MD;  Location: [facility] INVASIVE [facility];  Service: Cardiovascular;  Laterality: N/A;   CORONARY ARTERY BYPASS GRAFT N/A 11/20/2019   Procedure: CORONARY ARTERY BYPASS GRAFTING (CABG), ON PUMP, TIMES THREE, USING LEFT INTERNAL MAMMARY ARTERY AND ENDOSCOPICALLY HARVESTED RIGHT GREATER SAPHENOUS VEIN;  Surgeon: Linden Dolin, MD;  Location: [facility];  Service: Open Heart Surgery;  Laterality: N/A;   LEFT HEART CATH AND CORONARY ANGIOGRAPHY N/A 11/18/2019   Procedure: LEFT HEART CATH AND CORONARY ANGIOGRAPHY;  Surgeon: Corky Crafts, MD;  Location: [facility] INVASIVE CV LAB;  Service: Cardiovascular;  Laterality: N/A;   TEE WITHOUT CARDIOVERSION N/A 11/20/2019   Procedure: TRANSESOPHAGEAL ECHOCARDIOGRAM (TEE);  Surgeon: Linden Dolin,  MD;  Location: [facility];  Service: Open Heart Surgery;  Laterality: N/A;    There were no vitals filed for this visit.   Subjective Assessment - 02/03/21 0901     Subjective  Denies pain today    Limitations Hard of Hearing. Reads Lips. No Driving. Fall Risk    Patient Stated Goals Pt's niece reported coming for AFO consult and for discussing RUE functional use    Currently in Pain? No/denies              Splint Fabrication continued for custom resting hand splint on RUE for night time wear. Pt educated on instructions for wear and what signs to look out for. Pt demonstrated donning and doffing x 2 with supervision.                    OT Education - 02/03/21 0939     Education Details Splint wear and care. Demo donning and doffing x 2.    Person(s) Educated Patient    Methods Explanation;Demonstration;Handout    Comprehension Verbalized understanding;Returned demonstration              OT Short Term Goals - 01/20/21 0817       OT SHORT TERM GOAL #1   Title Pt and/or caregiver will be independent with HEP for stretching for RUE    Time 4    Period Weeks    Status New  Target Date 02/10/21      OT SHORT TERM GOAL #2   Title Pt will demonstrate ability to don cpap mask with mod I    Time 4    Period Weeks    Status On-going      OT SHORT TERM GOAL #3   Title Pt will demonstrate ability to don and doff any splints PRN    Time 4    Period Weeks    Status New      OT SHORT TERM GOAL #4   Title Pt will demonstrate improved passive elbow extension of -100 degrees or greater.    Baseline -105*    Time 4    Period Weeks    Status New               OT Long Term Goals - 01/13/21 1220       OT LONG TERM GOAL #1   Title Pt and caregiver will be independent with wear and care of splints PRN    Time 8    Period Weeks    Status New    Target Date 03/10/21      OT LONG TERM GOAL #2   Title Pt will increase passive range of motion of elbow  extension to -95 degrees or greater    Baseline -105*    Time 8    Period Weeks    Status New      OT LONG TERM GOAL #3   Title Pt will increase functional use of RUE by performing box and blocks with score of 6 or greater with RUE.    Baseline 3 blocks with R    Time 8    Period Weeks    Status New      OT LONG TERM GOAL #4   Title Pt will improve passive shoulder flexion to 60* or greater in order in RUE    Baseline 40*    Time 8    Period Weeks    Status New      OT LONG TERM GOAL #5   Title Pt will report using RUE for stabilization and assisting with functional tasks at least 50% of the day.    Time 8    Period Weeks    Status New                   Plan - 02/03/21 0940     Clinical Impression Statement Pt would benefit from botox for RUE to see if can manage some of the spasticity and tone. Resting hand splint completed for patient for RUE.    OT Occupational Profile and History Problem Focused Assessment - Including review of records relating to presenting problem    Occupational performance deficits (Please refer to evaluation for details): IADL's;ADL's;Leisure    Body Structure / Function / Physical Skills Tone;UE functional use;Flexibility;Mobility;Coordination;Strength;ROM;ADL;IADL;Dexterity;Decreased knowledge of use of DME    Rehab Potential Fair   late effects of CVA   Clinical Decision Making Limited treatment options, no task modification necessary    Comorbidities Affecting Occupational Performance: None    Modification or Assistance to Complete Evaluation  No modification of tasks or assist necessary to complete eval    OT Frequency 1x / week    OT Duration 8 weeks    OT Treatment/Interventions Self-care/ADL training;Fluidtherapy;Moist Heat;DME and/or AE instruction;Splinting;Patient/family education;Therapeutic activities;Therapeutic exercise;Neuromuscular education;Passive range of motion;Paraffin;Manual Therapy    Plan see how resting hand splint  is working for patient. CPAP  mask assess independence with donning and doffing    Consulted and Agree with Plan of Care Patient             Patient will benefit from skilled therapeutic intervention in order to improve the following deficits and impairments:   Body Structure / Function / Physical Skills: Tone, UE functional use, Flexibility, Mobility, Coordination, Strength, ROM, ADL, IADL, Dexterity, Decreased knowledge of use of DME       Visit Diagnosis: Spastic hemiplegia of right dominant side as late effect of cerebral infarction (HCC)  Stiffness of left wrist, not elsewhere classified  Other lack of coordination    Problem List Patient Active Problem List   Diagnosis Date Noted   Atherosclerotic heart disease of native coronary artery without angina pectoris 08/01/2020   Cerebrovascular accident (HCC) 08/01/2020   Constipation 08/01/2020   Mixed hyperlipidemia 08/01/2020   Personal history of colonic polyps 08/01/2020   Protein-calorie malnutrition, severe 12/02/2019   Paroxysmal atrial fibrillation (HCC)    Hx of CABG 11/20/2019   Pressure injury of skin 11/18/2019   NSTEMI (non-ST elevated myocardial infarction) (HCC) 11/17/2019   Elevated troponin 11/03/2019   PVD (peripheral vascular disease) (HCC) 11/03/2019   History of CVA with residual deficit 11/03/2019   Elevated CK 11/03/2019   Fall at home, initial encounter 11/03/2019   Prediabetes 11/03/2019   Essential hypertension 11/03/2019   PAD (peripheral artery disease) (HCC) 05/11/2019    Junious Dresser MOT, OTR/L  02/03/2021, 9:44 AM  [facility] [address] [place], [place], [address] Phone: [phone]   Fax:  [phone]  Name: [name] MRN: [id number] Date of Birth: [birth year]

## 2021-02-03 NOTE — Telephone Encounter (Signed)
Niece [name] needs to know what is next step for the orthotic from [facility] - does patient need to see you first - before [facility] fits him for orthotic device - do you have to put in referral - or can you do it before his July 28 visit - she rescheduled [id number] appt - she thought that appt was to follow up for PT--- she's just trying to confirm her next step---PLEASE ADVISE

## 2021-02-03 NOTE — Therapy (Signed)
[facility] [address] [place], [place], [address] Phone: [phone]   Fax:  [phone]  Physical Therapy Treatment  Patient Details  Name: [name] MRN: [id number] Date of Birth: 22-Sep-1935 Referring Provider (PT): Dr. Letta Pate   Encounter Date: 02/03/2021   PT End of Session - 02/03/21 0932     Visit Number 4    Number of Visits 9    Date for PT Re-Evaluation 03/19/21    Authorization Type [employer]    Authorization Time Period 01/13/21    Progress Note Due on Visit 9    PT Start Time 0930    PT Stop Time 1015    PT Time Calculation (min) 45 min    Equipment Utilized During Treatment Gait belt    Activity Tolerance Patient tolerated treatment well    Behavior During Therapy [facility] for tasks assessed/performed             Past Medical History:  Diagnosis Date   Arthritis    Bundle branch block    Hypercholesteremia    Hyperlipidemia 2001   Hypertension 2001   Hypertension    Peripheral vascular disease ([place])    Stroke ([place])    Stroke ([place])    2001 - R side deficits    Past Surgical History:  Procedure Laterality Date   ABDOMINAL AORTOGRAM N/A 11/18/2019   Procedure: ABDOMINAL AORTOGRAM;  Surgeon: Jettie Booze, MD;  Location: [facility];  Service: Cardiovascular;  Laterality: N/A;   CORONARY ARTERY BYPASS GRAFT N/A 11/20/2019   Procedure: CORONARY ARTERY BYPASS GRAFTING (CABG), ON PUMP, TIMES THREE, USING LEFT INTERNAL MAMMARY ARTERY AND ENDOSCOPICALLY HARVESTED RIGHT GREATER SAPHENOUS VEIN;  Surgeon: Wonda Olds, MD;  Location: [facility];  Service: Open Heart Surgery;  Laterality: N/A;   LEFT HEART CATH AND CORONARY ANGIOGRAPHY N/A 11/18/2019   Procedure: LEFT HEART CATH AND CORONARY ANGIOGRAPHY;  Surgeon: Jettie Booze, MD;  Location: [place] [facility];  Service: Cardiovascular;  Laterality: N/A;   TEE WITHOUT CARDIOVERSION N/A 11/20/2019   Procedure: TRANSESOPHAGEAL  ECHOCARDIOGRAM (TEE);  Surgeon: Wonda Olds, MD;  Location: [facility];  Service: Open Heart Surgery;  Laterality: N/A;    There were no vitals filed for this visit.   Subjective Assessment - 02/03/21 0940     Subjective no changes, no falls, no pain    Patient is accompained by: Family member    Pertinent History 85 y/o male , lives home alone (niece next door), presented s/p fall at home (3rd this year). PMHx includes: arthritis, R bundle branch block and bradycardia, HTN, PVD, CVA w/ R side weakness. In ED had contusion of B shoulders and R hip, signs of aortic and corinary artherosclerotic disease. Pt admitted with elevated troponin.    Limitations Sitting    How long can you sit comfortably? unlimited    How long can you stand comfortably? <15 min    How long can you walk comfortably? <15 min    Patient Stated Goals To be able to function better and obtain an new AFO                               [facility] Adult PT Treatment/Exercise - 02/03/21 0001       Transfers   Transfers Sit to Stand    Sit to Stand 4: Min guard;With upper extremity assist;From bed;From chair/3-in-1    Stand  to Sit 4: Min guard;With upper extremity assist;To bed;To chair/3-in-1      Ambulation/Gait   Ambulation/Gait Yes    Ambulation/Gait Assistance 5: Supervision;4: Min guard    Ambulation Distance (Feet) 115 Feet    Assistive device Straight cane    Gait Pattern Step-to pattern;Step-through pattern;Decreased stride length;Decreased step length - right;Decreased stance time - left;Decreased hip/knee flexion - right;Right steppage;Decreased trunk rotation;Decreased arm swing - right    Ambulation Surface Level;Indoor    Number of Stairs 4    Gait Comments needs to demo proper technique descending      Knee/Hip Exercises: Standing   Hip Flexion Both;2 sets;10 reps    Hip Flexion Limitations alternating pattern    Lateral Step Up Both;2 sets;10 reps;Limitations;Step Height: 2"     Lateral Step Up Limitations step taps    Forward Step Up Both;10 reps;Step Height: 2"    Forward Step Up Limitations step taps      Knee/Hip Exercises: Seated   Long Arc Quad 2 sets;10 reps;Both    Heel Slides Strengthening;Both;2 sets;10 reps;Limitations    Heel Slides Limitations towel slides    Ball Squeeze performed with LAQs, 2x10    Marching Both;2 sets;10 reps                      PT Short Term Goals - 01/27/21 1346       PT SHORT TERM GOAL #1   Title Patient to become I in HEP (all STGs due 02/19/21)    Baseline TBD;    Time 5    Period Weeks    Status Achieved    Target Date 02/19/21      PT SHORT TERM GOAL #2   Title Assess DGI and set appropriate goal; 01/27/21 Goal is 15/24    Baseline 01/20/21: 9/24 with cane/AFO    Time 4    Period Weeks    Status Partially Met    Target Date 02/19/21      PT SHORT TERM GOAL #3   Title Patient to ambulate 231f with R AFO and S    Baseline 1249fwith R AFO and SBA    Time 4    Period Weeks    Status On-going    Target Date 02/19/21      PT SHORT TERM GOAL #4   Title Obtain R AFO consult    Baseline TBD; [facility]    Time 4    Period Weeks    Status Achieved    Target Date 02/19/21               PT Long Term Goals - 01/15/21 0750       PT LONG TERM GOAL #1   Title Increase gait speed to 0.3 m/s with R AFO and no AD    Baseline 0.21 m/s gait speed    Time 8    Period Weeks    Status New    Target Date 03/19/21      PT LONG TERM GOAL #2   Title Patient able to perfom 5x STS in 20s    Baseline 5x STS in 26.6s    Time 8    Period Weeks    Status New    Target Date 03/19/21      PT LONG TERM GOAL #3   Title Patient to ambulate 50023fith R AFO and SBA    Baseline 125f79fth R AFO  and SBA    Time 8    Period Weeks    Status New    Target Date 03/19/21      PT LONG TERM GOAL #4   Title Assess progress towards DGI    Baseline TBD    Time 8     Period Weeks    Status New    Target Date 03/19/21                   Plan - 02/03/21 0940     Clinical Impression Statement Todays sessioncontinued to focus on LE sterngth and balance training, new activities added and existing activities made more challenging.  Incorporated balance and SLS stance tasks into program.  Provided tactile cues for WS during step ups, rest breaks added as needed to accommodate fatigue.  Assessed stairs and patient needs to demo proper techniuq eof descending with R tp lessen fall risk    Personal Factors and Comorbidities Age;Comorbidity 1;Time since onset of injury/illness/exacerbation    Examination-Activity Limitations Stand;Stairs;Locomotion Level    Examination-Participation Restrictions Shop    Stability/Clinical Decision Making Stable/Uncomplicated    Rehab Potential Good    PT Frequency 1x / week    PT Duration 8 weeks    PT Treatment/Interventions ADLs/Self Care Home Management;DME Instruction;Gait training;Stair training;Functional mobility training;Therapeutic activities;Therapeutic exercise;Balance training;Neuromuscular re-education;Patient/family education;Orthotic Fit/Training    PT Next Visit Plan monitor HEP and update as appropriate, continue SLS stance tasks and stepping activities, advance to more dynamic exercises as tolerated, f/u on brace appointment, stair training    PT [place] and Agree with Plan of Care Patient;Family member/caregiver             Patient will benefit from skilled therapeutic intervention in order to improve the following deficits and impairments:  Abnormal gait, Decreased endurance, Impaired tone, Decreased activity tolerance, Decreased strength, Decreased balance, Decreased mobility, Difficulty walking, Increased muscle spasms  Visit Diagnosis: Spastic hemiplegia of right dominant side as late effect of cerebral infarction [facility])  Muscle weakness  (generalized)  Other lack of coordination     Problem List Patient Active Problem List   Diagnosis Date Noted   Atherosclerotic heart disease of native coronary artery without angina pectoris 08/01/2020   Cerebrovascular accident ([place]) 08/01/2020   Constipation 08/01/2020   Mixed hyperlipidemia 08/01/2020   Personal history of colonic polyps 08/01/2020   Protein-calorie malnutrition, severe 12/02/2019   Paroxysmal atrial fibrillation ([facility])    Hx of CABG 11/20/2019   Pressure injury of skin 11/18/2019   NSTEMI (non-ST elevated myocardial infarction) ([place]) 11/17/2019   Elevated troponin 11/03/2019   PVD (peripheral vascular disease) ([place]) 11/03/2019   History of CVA with residual deficit 11/03/2019   Elevated CK 11/03/2019   Fall at home, initial encounter 11/03/2019   Prediabetes 11/03/2019   Essential hypertension 11/03/2019   PAD (peripheral artery disease) ([place]) 05/11/2019    Lanice Shirts PT 02/03/2021, 10:14 AM  [facility] [address] [place], [place], [address] Phone: [phone]   Fax:  [phone]  Name: [name] MRN: [id number] Date of Birth: [birth year]

## 2021-02-07 ENCOUNTER — Encounter: Payer: Medicare PPO | Admitting: Physical Medicine & Rehabilitation

## 2021-02-10 ENCOUNTER — Ambulatory Visit: Payer: Medicare PPO

## 2021-02-10 ENCOUNTER — Encounter: Payer: Self-pay | Admitting: Occupational Therapy

## 2021-02-10 ENCOUNTER — Other Ambulatory Visit: Payer: Self-pay

## 2021-02-10 ENCOUNTER — Ambulatory Visit: Payer: Medicare PPO | Admitting: Occupational Therapy

## 2021-02-10 DIAGNOSIS — R278 Other lack of coordination: Secondary | ICD-10-CM | POA: Diagnosis not present

## 2021-02-10 DIAGNOSIS — R2689 Other abnormalities of gait and mobility: Secondary | ICD-10-CM | POA: Diagnosis not present

## 2021-02-10 DIAGNOSIS — M25632 Stiffness of left wrist, not elsewhere classified: Secondary | ICD-10-CM

## 2021-02-10 DIAGNOSIS — R2681 Unsteadiness on feet: Secondary | ICD-10-CM | POA: Diagnosis not present

## 2021-02-10 DIAGNOSIS — I69351 Hemiplegia and hemiparesis following cerebral infarction affecting right dominant side: Secondary | ICD-10-CM

## 2021-02-10 DIAGNOSIS — M25621 Stiffness of right elbow, not elsewhere classified: Secondary | ICD-10-CM | POA: Diagnosis not present

## 2021-02-10 DIAGNOSIS — M25611 Stiffness of right shoulder, not elsewhere classified: Secondary | ICD-10-CM | POA: Diagnosis not present

## 2021-02-10 DIAGNOSIS — M6281 Muscle weakness (generalized): Secondary | ICD-10-CM

## 2021-02-10 NOTE — Therapy (Signed)
[facility] [address] [place], [place], [address] Phone: [phone]   Fax:  [phone]  Physical Therapy Treatment  Patient Details  Name: [name] MRN: [id number] Date of Birth: [birth year] Referring Provider (PT): Dr. Letta Pate   Encounter Date: 02/10/2021   PT End of Session - 02/10/21 0850     Visit Number 5    Number of Visits 9    Date for PT Re-Evaluation 03/19/21    Authorization Type [employer]    Authorization Time Period 01/13/21    Progress Note Due on Visit 9    PT Start Time 0845    PT Stop Time 0930    PT Time Calculation (min) 45 min    Equipment Utilized During Treatment Gait belt    Activity Tolerance Patient tolerated treatment well    Behavior During Therapy [facility] for tasks assessed/performed             Past Medical History:  Diagnosis Date   Arthritis    Bundle branch block    Hypercholesteremia    Hyperlipidemia 2001   Hypertension 2001   Hypertension    Peripheral vascular disease ([place])    Stroke ([place])    Stroke ([place])    2001 - R side deficits    Past Surgical History:  Procedure Laterality Date   ABDOMINAL AORTOGRAM N/A 11/18/2019   Procedure: ABDOMINAL AORTOGRAM;  Surgeon: Jettie Booze, MD;  Location: [facility];  Service: Cardiovascular;  Laterality: N/A;   CORONARY ARTERY BYPASS GRAFT N/A 11/20/2019   Procedure: CORONARY ARTERY BYPASS GRAFTING (CABG), ON PUMP, TIMES THREE, USING LEFT INTERNAL MAMMARY ARTERY AND ENDOSCOPICALLY HARVESTED RIGHT GREATER SAPHENOUS VEIN;  Surgeon: Wonda Olds, MD;  Location: [facility];  Service: Open Heart Surgery;  Laterality: N/A;   LEFT HEART CATH AND CORONARY ANGIOGRAPHY N/A 11/18/2019   Procedure: LEFT HEART CATH AND CORONARY ANGIOGRAPHY;  Surgeon: Jettie Booze, MD;  Location: [facility];  Service: Cardiovascular;  Laterality: N/A;   TEE WITHOUT CARDIOVERSION N/A 11/20/2019   Procedure: TRANSESOPHAGEAL  ECHOCARDIOGRAM (TEE);  Surgeon: Wonda Olds, MD;  Location: [facility];  Service: Open Heart Surgery;  Laterality: N/A;    There were no vitals filed for this visit.   Subjective Assessment - 02/10/21 0850     Subjective no changes, no falls, no pain, still awaiting brace fitting    Patient is accompained by: Family member    Pertinent History 85 y/o male , lives home alone (niece next door), presented s/p fall at home (3rd this year). PMHx includes: arthritis, R bundle branch block and bradycardia, HTN, PVD, CVA w/ R side weakness. In ED had contusion of B shoulders and R hip, signs of aortic and corinary artherosclerotic disease. Pt admitted with elevated troponin.    Limitations Sitting    How long can you sit comfortably? unlimited    How long can you stand comfortably? <15 min    How long can you walk comfortably? <15 min    Patient Stated Goals To be able to function better and obtain an new AFO                               [facility] Adult PT Treatment/Exercise - 02/10/21 0001       Transfers   Transfers Sit to Stand    Sit to Stand 4: Min guard;With upper extremity assist;From bed;From chair/3-in-1  Stand to Sit 4: Min guard;With upper extremity assist;To bed;To chair/3-in-1      Ambulation/Gait   Ambulation/Gait Yes    Ambulation/Gait Assistance 5: Supervision;4: Min guard    Ambulation Distance (Feet) 115 Feet    Assistive device Straight cane    Gait Pattern Step-to pattern;Step-through pattern;Decreased stride length;Decreased step length - right;Decreased stance time - left;Decreased hip/knee flexion - right;Right steppage;Decreased trunk rotation;Decreased arm swing - right    Ambulation Surface Level;Indoor    Number of Stairs 8    Gait Comments needs to demo proper technique descending, reviewed with patient and performed better on second trip      Knee/Hip Exercises: Standing   Hip Flexion Both;2 sets;15 reps    Hip Flexion Limitations  alternating pattern    Lateral Step Up Both;2 sets;10 reps;Limitations;Step Height: 4"    Lateral Step Up Limitations step taps    Forward Step Up Both;10 reps;Step Height: 4"    Forward Step Up Limitations step taps      Knee/Hip Exercises: Seated   Long Arc Quad 3 sets;10 reps;Both    Heel Slides Strengthening;Both;2 sets;15 reps;Limitations    Heel Slides Limitations towel slides    Abduction/Adduction  2 sets;15 reps;Limitations    Abd/Adduction Limitations yellow band                      PT Short Term Goals - 01/27/21 1346       PT SHORT TERM GOAL #1   Title Patient to become I in HEP (all STGs due 02/19/21)    Baseline TBD;    Time 5    Period Weeks    Status Achieved    Target Date 02/19/21      PT SHORT TERM GOAL #2   Title Assess DGI and set appropriate goal; 01/27/21 Goal is 15/24    Baseline 01/20/21: 9/24 with cane/AFO    Time 4    Period Weeks    Status Partially Met    Target Date 02/19/21      PT SHORT TERM GOAL #3   Title Patient to ambulate 225ft with R AFO and S    Baseline 144ft with R AFO and SBA    Time 4    Period Weeks    Status On-going    Target Date 02/19/21      PT SHORT TERM GOAL #4   Title Obtain R AFO consult    Baseline TBD; 01/27/21 Spoke with [facility], AFO consult in progess    Time 4    Period Weeks    Status Achieved    Target Date 02/19/21               PT Long Term Goals - 01/15/21 0750       PT LONG TERM GOAL #1   Title Increase gait speed to 0.3 m/s with R AFO and no AD    Baseline 0.21 m/s gait speed    Time 8    Period Weeks    Status New    Target Date 03/19/21      PT LONG TERM GOAL #2   Title Patient able to perfom 5x STS in 20s    Baseline 5x STS in 26.6s    Time 8    Period Weeks    Status New    Target Date 03/19/21      PT LONG TERM GOAL #3   Title Patient to ambulate 537ft with R AFO and  SBA    Baseline 143ft with R AFO and SBA    Time 8    Period Weeks    Status New     Target Date 03/19/21      PT LONG TERM GOAL #4   Title Assess progress towards DGI    Baseline TBD    Time 8    Period Weeks    Status New    Target Date 03/19/21                   Plan - 02/10/21 0908     Clinical Impression Statement Todays session consisted of continued LE strengthening in seated and standing positions, reps were increased and step height was elevated to increase challenge.  Continued difficulty with R foot placement and WS onto RLE.  stair negotiation reviewed with focus paid to proper pattern when descending.  Initially needed tactile assist to plac L foot onto 4" block but became I with reps.  cane lowered 2 notches to comply with patient height.    Personal Factors and Comorbidities Age;Comorbidity 1;Time since onset of injury/illness/exacerbation    Examination-Activity Limitations Stand;Stairs;Locomotion Level    Examination-Participation Restrictions Shop    Stability/Clinical Decision Making Stable/Uncomplicated    Rehab Potential Good    PT Frequency 1x / week    PT Duration 8 weeks    PT Treatment/Interventions ADLs/Self Care Home Management;DME Instruction;Gait training;Stair training;Functional mobility training;Therapeutic activities;Therapeutic exercise;Balance training;Neuromuscular re-education;Patient/family education;Orthotic Fit/Training    PT Next Visit Plan Continue SLS stance tasks and stepping activities, advance to more dynamic exercises as tolerated, f/u on brace appointment, stair training, focus on proper form and R foot placement startegies, recheck cane height for comfotr    PT Home Exercise Plan [email or web address]    Consulted and Agree with Plan of Care Patient;Family member/caregiver             Patient will benefit from skilled therapeutic intervention in order to improve the following deficits and impairments:  Abnormal gait, Decreased endurance, Impaired tone, Decreased activity tolerance, Decreased strength, Decreased balance,  Decreased mobility, Difficulty walking, Increased muscle spasms  Visit Diagnosis: Spastic hemiplegia of right dominant side as late effect of cerebral infarction ([facility])  Stiffness of left wrist, not elsewhere classified  Muscle weakness (generalized)     Problem List Patient Active Problem List   Diagnosis Date Noted   Atherosclerotic heart disease of native coronary artery without angina pectoris 08/01/2020   Cerebrovascular accident ([place]) 08/01/2020   Constipation 08/01/2020   Mixed hyperlipidemia 08/01/2020   Personal history of colonic polyps 08/01/2020   Protein-calorie malnutrition, severe 12/02/2019   Paroxysmal atrial fibrillation ([facility])    Hx of CABG 11/20/2019   Pressure injury of skin 11/18/2019   NSTEMI (non-ST elevated myocardial infarction) ([place]) 11/17/2019   Elevated troponin 11/03/2019   PVD (peripheral vascular disease) ([employer]) 11/03/2019   History of CVA with residual deficit 11/03/2019   Elevated CK 11/03/2019   Fall at home, initial encounter 11/03/2019   Prediabetes 11/03/2019   Essential hypertension 11/03/2019   PAD (peripheral artery disease) ([place]) 05/11/2019    Lanice Shirts 02/10/2021, 9:32 AM  [facility] [address] [place], [place], [address] Phone: [phone]   Fax:  [phone]  Name: JAILEN COWARD MRN: [id number] Date of Birth: 02-06-1936

## 2021-02-10 NOTE — Therapy (Signed)
[facility] [address] [place], [place], [address] Phone: [phone]   Fax:  [phone]  Occupational Therapy Treatment  Patient Details  Name: [name] MRN: [id number] Date of Birth: February 08, 1936 Referring Provider (OT): Claudette Laws, MD   Encounter Date: 02/10/2021   OT End of Session - 02/10/21 0805     Visit Number 5    Number of Visits 9    Date for OT Re-Evaluation 03/10/21    Authorization Type [employer]    Authorization Time Period $20 copay- Auth Req'd    OT Start Time 0802    OT Stop Time 0840    OT Time Calculation (min) 38 min    Activity Tolerance Patient tolerated treatment well    Behavior During Therapy [facility] for tasks assessed/performed             Past Medical History:  Diagnosis Date   Arthritis    Bundle branch block    Hypercholesteremia    Hyperlipidemia 2001   Hypertension 2001   Hypertension    Peripheral vascular disease (HCC)    Stroke (HCC)    Stroke (HCC)    2001 - R side deficits    Past Surgical History:  Procedure Laterality Date   ABDOMINAL AORTOGRAM N/A 11/18/2019   Procedure: ABDOMINAL AORTOGRAM;  Surgeon: Corky Crafts, MD;  Location: [facility] INVASIVE CV LAB;  Service: Cardiovascular;  Laterality: N/A;   CORONARY ARTERY BYPASS GRAFT N/A 11/20/2019   Procedure: CORONARY ARTERY BYPASS GRAFTING (CABG), ON PUMP, TIMES THREE, USING LEFT INTERNAL MAMMARY ARTERY AND ENDOSCOPICALLY HARVESTED RIGHT GREATER SAPHENOUS VEIN;  Surgeon: Linden Dolin, MD;  Location: [facility] OR;  Service: Open Heart Surgery;  Laterality: N/A;   LEFT HEART CATH AND CORONARY ANGIOGRAPHY N/A 11/18/2019   Procedure: LEFT HEART CATH AND CORONARY ANGIOGRAPHY;  Surgeon: Corky Crafts, MD;  Location: [facility] INVASIVE CV LAB;  Service: Cardiovascular;  Laterality: N/A;   TEE WITHOUT CARDIOVERSION N/A 11/20/2019   Procedure: TRANSESOPHAGEAL ECHOCARDIOGRAM (TEE);  Surgeon: Linden Dolin,  MD;  Location: [facility] OR;  Service: Open Heart Surgery;  Laterality: N/A;    There were no vitals filed for this visit.   Subjective Assessment - 02/10/21 0804     Subjective  "sometimes it's stiff"    Patient is accompanied by: --   Aram Beecham, dropped patient off   Limitations Hard of Hearing. Reads Lips. No Driving. Fall Risk    Patient Stated Goals Pt's niece reported coming for AFO consult and for discussing RUE functional use    Currently in Pain? No/denies             Pt received from lobby. Pt's cousin, Olegario Messier, was present in lobby but left after OT got patient.  Splint pt demonstrated donning resting hand splint with mod I.   Manual Therapy (PROM) for RUE at bicep, wrist flexors and finger flexors for increase in prolonged stretch for breaking up some of the tone present in patient's RUE. Pt responds well to PROM but unable to get more than approx 20 degrees with prolonged PROM.   NMR onto R forearm for working on inhibition of tone.   Pt went to bathroom. Pt ambulated with mod I with straight cane. Pt assisted to mat for PT.         OT Short Term Goals - 01/20/21 0817       OT SHORT TERM GOAL #1   Title Pt and/or caregiver will be independent  with HEP for stretching for RUE    Time 4    Period Weeks    Status New    Target Date 02/10/21      OT SHORT TERM GOAL #2   Title Pt will demonstrate ability to don cpap mask with mod I    Time 4    Period Weeks    Status On-going      OT SHORT TERM GOAL #3   Title Pt will demonstrate ability to don and doff any splints PRN    Time 4    Period Weeks    Status New      OT SHORT TERM GOAL #4   Title Pt will demonstrate improved passive elbow extension of -100 degrees or greater.    Baseline -105*    Time 4    Period Weeks    Status New               OT Long Term Goals - 01/13/21 1220       OT LONG TERM GOAL #1   Title Pt and caregiver will be independent with wear and care of splints PRN    Time  8    Period Weeks    Status New    Target Date 03/10/21      OT LONG TERM GOAL #2   Title Pt will increase passive range of motion of elbow extension to -95 degrees or greater    Baseline -105*    Time 8    Period Weeks    Status New      OT LONG TERM GOAL #3   Title Pt will increase functional use of RUE by performing box and blocks with score of 6 or greater with RUE.    Baseline 3 blocks with R    Time 8    Period Weeks    Status New      OT LONG TERM GOAL #4   Title Pt will improve passive shoulder flexion to 60* or greater in order in RUE    Baseline 40*    Time 8    Period Weeks    Status New      OT LONG TERM GOAL #5   Title Pt will report using RUE for stabilization and assisting with functional tasks at least 50% of the day.    Time 8    Period Weeks    Status New                   Plan - 02/10/21 0177     Clinical Impression Statement Pt with good tolerance of resting hand splint and ability to don/doff.    OT Occupational Profile and History Problem Focused Assessment - Including review of records relating to presenting problem    Occupational performance deficits (Please refer to evaluation for details): IADL's;ADL's;Leisure    Body Structure / Function / Physical Skills Tone;UE functional use;Flexibility;Mobility;Coordination;Strength;ROM;ADL;IADL;Dexterity;Decreased knowledge of use of DME    Rehab Potential Fair   late effects of CVA   Clinical Decision Making Limited treatment options, no task modification necessary    Comorbidities Affecting Occupational Performance: None    Modification or Assistance to Complete Evaluation  No modification of tasks or assist necessary to complete eval    OT Frequency 1x / week    OT Duration 8 weeks    OT Treatment/Interventions Self-care/ADL training;Fluidtherapy;Moist Heat;DME and/or AE instruction;Splinting;Patient/family education;Therapeutic activities;Therapeutic exercise;Neuromuscular education;Passive  range of motion;Paraffin;Manual Therapy    Plan  maybe some heat on RUE elbow and/or wrist/hand before PROM if able, supine stretches, reviewing self PROM for patient    Consulted and Agree with Plan of Care Patient             Patient will benefit from skilled therapeutic intervention in order to improve the following deficits and impairments:   Body Structure / Function / Physical Skills: Tone, UE functional use, Flexibility, Mobility, Coordination, Strength, ROM, ADL, IADL, Dexterity, Decreased knowledge of use of DME       Visit Diagnosis: Spastic hemiplegia of right dominant side as late effect of cerebral infarction (HCC)  Stiffness of left wrist, not elsewhere classified  Other lack of coordination  Muscle weakness (generalized)  Stiffness of right shoulder, not elsewhere classified  Stiffness of right elbow, not elsewhere classified    Problem List Patient Active Problem List   Diagnosis Date Noted   Atherosclerotic heart disease of native coronary artery without angina pectoris 08/01/2020   Cerebrovascular accident (HCC) 08/01/2020   Constipation 08/01/2020   Mixed hyperlipidemia 08/01/2020   Personal history of colonic polyps 08/01/2020   Protein-calorie malnutrition, severe 12/02/2019   Paroxysmal atrial fibrillation (HCC)    Hx of CABG 11/20/2019   Pressure injury of skin 11/18/2019   NSTEMI (non-ST elevated myocardial infarction) (HCC) 11/17/2019   Elevated troponin 11/03/2019   PVD (peripheral vascular disease) (HCC) 11/03/2019   History of CVA with residual deficit 11/03/2019   Elevated CK 11/03/2019   Fall at home, initial encounter 11/03/2019   Prediabetes 11/03/2019   Essential hypertension 11/03/2019   PAD (peripheral artery disease) (HCC) 05/11/2019    Junious Dresser MOT, OTR/L  02/10/2021, 9:06 AM  [facility] [address] [place], [place], [address] Phone: [phone]    Fax:  [phone]  Name: DUANTE AROCHO MRN: [id number] Date of Birth: [birth year]

## 2021-02-11 DIAGNOSIS — G4733 Obstructive sleep apnea (adult) (pediatric): Secondary | ICD-10-CM | POA: Diagnosis not present

## 2021-02-11 DIAGNOSIS — I739 Peripheral vascular disease, unspecified: Secondary | ICD-10-CM | POA: Diagnosis not present

## 2021-02-17 ENCOUNTER — Other Ambulatory Visit: Payer: Self-pay

## 2021-02-17 ENCOUNTER — Ambulatory Visit: Payer: Medicare PPO

## 2021-02-17 ENCOUNTER — Telehealth: Payer: Self-pay

## 2021-02-17 ENCOUNTER — Ambulatory Visit: Payer: Medicare PPO | Admitting: Physical Therapy

## 2021-02-17 ENCOUNTER — Ambulatory Visit: Payer: Medicare PPO | Admitting: Occupational Therapy

## 2021-02-17 DIAGNOSIS — R2681 Unsteadiness on feet: Secondary | ICD-10-CM | POA: Diagnosis not present

## 2021-02-17 DIAGNOSIS — M25621 Stiffness of right elbow, not elsewhere classified: Secondary | ICD-10-CM | POA: Diagnosis not present

## 2021-02-17 DIAGNOSIS — M6281 Muscle weakness (generalized): Secondary | ICD-10-CM

## 2021-02-17 DIAGNOSIS — R278 Other lack of coordination: Secondary | ICD-10-CM

## 2021-02-17 DIAGNOSIS — R2689 Other abnormalities of gait and mobility: Secondary | ICD-10-CM | POA: Diagnosis not present

## 2021-02-17 DIAGNOSIS — M25632 Stiffness of left wrist, not elsewhere classified: Secondary | ICD-10-CM | POA: Diagnosis not present

## 2021-02-17 DIAGNOSIS — I69351 Hemiplegia and hemiparesis following cerebral infarction affecting right dominant side: Secondary | ICD-10-CM

## 2021-02-17 DIAGNOSIS — M25611 Stiffness of right shoulder, not elsewhere classified: Secondary | ICD-10-CM

## 2021-02-17 NOTE — Telephone Encounter (Signed)
TC to [facility] to f/u on AFO referral.  Patient ha a fitting scheduled on 02/24/21

## 2021-02-17 NOTE — Therapy (Signed)
[facility] [address] [place], [place], [address] Phone: [phone]   Fax:  [phone]  Occupational Therapy Treatment  Patient Details  Name: [name] MRN: [id number] Date of Birth: 15-Aug-1935 Referring Provider (OT): Claudette Laws, MD   Encounter Date: 02/17/2021   OT End of Session - 02/17/21 1254     Visit Number 6    Number of Visits 9    Date for OT Re-Evaluation 03/10/21    Authorization Type [employer]    Authorization Time Period $20 copay- Auth Req'd    OT Start Time [phone]    OT Stop Time 1015    OT Time Calculation (min) 41 min    Activity Tolerance Patient tolerated treatment well    Behavior During Therapy [facility] for tasks assessed/performed             Past Medical History:  Diagnosis Date   Arthritis    Bundle branch block    Hypercholesteremia    Hyperlipidemia 2001   Hypertension 2001   Hypertension    Peripheral vascular disease (HCC)    Stroke (HCC)    Stroke (HCC)    2001 - R side deficits    Past Surgical History:  Procedure Laterality Date   ABDOMINAL AORTOGRAM N/A 11/18/2019   Procedure: ABDOMINAL AORTOGRAM;  Surgeon: Corky Crafts, MD;  Location: [facility] INVASIVE CV LAB;  Service: Cardiovascular;  Laterality: N/A;   CORONARY ARTERY BYPASS GRAFT N/A 11/20/2019   Procedure: CORONARY ARTERY BYPASS GRAFTING (CABG), ON PUMP, TIMES THREE, USING LEFT INTERNAL MAMMARY ARTERY AND ENDOSCOPICALLY HARVESTED RIGHT GREATER SAPHENOUS VEIN;  Surgeon: Linden Dolin, MD;  Location: [facility] OR;  Service: Open Heart Surgery;  Laterality: N/A;   LEFT HEART CATH AND CORONARY ANGIOGRAPHY N/A 11/18/2019   Procedure: LEFT HEART CATH AND CORONARY ANGIOGRAPHY;  Surgeon: Corky Crafts, MD;  Location: [facility] INVASIVE CV LAB;  Service: Cardiovascular;  Laterality: N/A;   TEE WITHOUT CARDIOVERSION N/A 11/20/2019   Procedure: TRANSESOPHAGEAL ECHOCARDIOGRAM (TEE);  Surgeon: Linden Dolin,  MD;  Location: [facility] OR;  Service: Open Heart Surgery;  Laterality: N/A;    There were no vitals filed for this visit.   Pt denies pain today   Treatment: Pt arrived with resting hand splint. Therapist modified fit for improved positioning and comfort. Portion at thenar eminence was rolled back and padding added, thumb portion was re-heated and fitted, new strapping was added, pt returned demonstration of application. Pt's caregiver was not present however written precautions provided.  Pt performed self ROM table slides for shoulder flexion and horizontal abduction, min v.c  Functional use of RUE removing dowel pegs from pegboard, min difficulty/ v.c                   OT Education - 02/17/21 1250     Education Details splint wear, care and precautions, reveiwed application of splint- handout sent home with pt as family was not present    Person(s) Educated Patient    Methods Explanation;Demonstration;Handout    Comprehension Verbalized understanding;Returned demonstration              OT Short Term Goals - 01/20/21 0817       OT SHORT TERM GOAL #1   Title Pt and/or caregiver will be independent with HEP for stretching for RUE    Time 4    Period Weeks    Status New    Target Date 02/10/21  OT SHORT TERM GOAL #2   Title Pt will demonstrate ability to don cpap mask with mod I    Time 4    Period Weeks    Status On-going      OT SHORT TERM GOAL #3   Title Pt will demonstrate ability to don and doff any splints PRN    Time 4    Period Weeks    Status New      OT SHORT TERM GOAL #4   Title Pt will demonstrate improved passive elbow extension of -100 degrees or greater.    Baseline -105*    Time 4    Period Weeks    Status New               OT Long Term Goals - 01/13/21 1220       OT LONG TERM GOAL #1   Title Pt and caregiver will be independent with wear and care of splints PRN    Time 8    Period Weeks    Status New    Target Date  03/10/21      OT LONG TERM GOAL #2   Title Pt will increase passive range of motion of elbow extension to -95 degrees or greater    Baseline -105*    Time 8    Period Weeks    Status New      OT LONG TERM GOAL #3   Title Pt will increase functional use of RUE by performing box and blocks with score of 6 or greater with RUE.    Baseline 3 blocks with R    Time 8    Period Weeks    Status New      OT LONG TERM GOAL #4   Title Pt will improve passive shoulder flexion to 60* or greater in order in RUE    Baseline 40*    Time 8    Period Weeks    Status New      OT LONG TERM GOAL #5   Title Pt will report using RUE for stabilization and assisting with functional tasks at least 50% of the day.    Time 8    Period Weeks    Status New                   Plan - 02/17/21 1251     Clinical Impression Statement Pt arrived with resting hand splint. Therapist cleaned splint and performed adjustments for improved fit and comfort.    OT Occupational Profile and History Problem Focused Assessment - Including review of records relating to presenting problem    Occupational performance deficits (Please refer to evaluation for details): IADL's;ADL's;Leisure    Body Structure / Function / Physical Skills Tone;UE functional use;Flexibility;Mobility;Coordination;Strength;ROM;ADL;IADL;Dexterity;Decreased knowledge of use of DME    Rehab Potential Fair   late effects of CVA   Clinical Decision Making Limited treatment options, no task modification necessary    Comorbidities Affecting Occupational Performance: None    Modification or Assistance to Complete Evaluation  No modification of tasks or assist necessary to complete eval    OT Frequency 1x / week    OT Duration 8 weeks    OT Treatment/Interventions Self-care/ADL training;Fluidtherapy;Moist Heat;DME and/or AE instruction;Splinting;Patient/family education;Therapeutic activities;Therapeutic exercise;Neuromuscular education;Passive  range of motion;Paraffin;Manual Therapy    Plan splint check following modifications, continue stretching/ self ROM, discuss plans for d/c at end of POC with caregiver    Consulted and Agree with Plan of  Care Patient             Patient will benefit from skilled therapeutic intervention in order to improve the following deficits and impairments:   Body Structure / Function / Physical Skills: Tone, UE functional use, Flexibility, Mobility, Coordination, Strength, ROM, ADL, IADL, Dexterity, Decreased knowledge of use of DME       Visit Diagnosis: Spastic hemiplegia of right dominant side as late effect of cerebral infarction (HCC)  Stiffness of left wrist, not elsewhere classified  Other lack of coordination  Muscle weakness (generalized)  Stiffness of right shoulder, not elsewhere classified  Stiffness of right elbow, not elsewhere classified    Problem List Patient Active Problem List   Diagnosis Date Noted   Atherosclerotic heart disease of native coronary artery without angina pectoris 08/01/2020   Cerebrovascular accident (HCC) 08/01/2020   Constipation 08/01/2020   Mixed hyperlipidemia 08/01/2020   Personal history of colonic polyps 08/01/2020   Protein-calorie malnutrition, severe 12/02/2019   Paroxysmal atrial fibrillation (HCC)    Hx of CABG 11/20/2019   Pressure injury of skin 11/18/2019   NSTEMI (non-ST elevated myocardial infarction) (HCC) 11/17/2019   Elevated troponin 11/03/2019   PVD (peripheral vascular disease) (HCC) 11/03/2019   History of CVA with residual deficit 11/03/2019   Elevated CK 11/03/2019   Fall at home, initial encounter 11/03/2019   Prediabetes 11/03/2019   Essential hypertension 11/03/2019   PAD (peripheral artery disease) (HCC) 05/11/2019    Shamiracle Gorden 02/17/2021, 12:54 PM  [facility] [address] [place], [place], [address] Phone: [phone]   Fax:   [phone]  Name: AGAMJOT KILGALLON MRN: [id number] Date of Birth: 1936-07-10

## 2021-02-17 NOTE — Therapy (Signed)
[facility] [address] [place], [place], [address] Phone: [phone]   Fax:  [phone]  Physical Therapy Treatment  Patient Details  Name: [name] MRN: [id number] Date of Birth: 12-18-1935 Referring Provider (PT): Dr. Letta Pate   Encounter Date: 02/17/2021   PT End of Session - 02/17/21 1043     Visit Number 6    Number of Visits 9    Date for PT Re-Evaluation 03/19/21    Authorization Type [employer]    Authorization Time Period 01/13/21    Progress Note Due on Visit 9    PT Start Time 1025    PT Stop Time 1100    PT Time Calculation (min) 35 min    Equipment Utilized During Treatment Gait belt    Activity Tolerance Patient tolerated treatment well    Behavior During Therapy [facility] for tasks assessed/performed             Past Medical History:  Diagnosis Date   Arthritis    Bundle branch block    Hypercholesteremia    Hyperlipidemia 2001   Hypertension 2001   Hypertension    Peripheral vascular disease ([place])    Stroke ([place])    Stroke ([place])    2001 - R side deficits    Past Surgical History:  Procedure Laterality Date   ABDOMINAL AORTOGRAM N/A 11/18/2019   Procedure: ABDOMINAL AORTOGRAM;  Surgeon: Jettie Booze, MD;  Location: [place] CV LAB;  Service: Cardiovascular;  Laterality: N/A;   CORONARY ARTERY BYPASS GRAFT N/A 11/20/2019   Procedure: CORONARY ARTERY BYPASS GRAFTING (CABG), ON PUMP, TIMES THREE, USING LEFT INTERNAL MAMMARY ARTERY AND ENDOSCOPICALLY HARVESTED RIGHT GREATER SAPHENOUS VEIN;  Surgeon: Wonda Olds, MD;  Location: [place];  Service: Open Heart Surgery;  Laterality: N/A;   LEFT HEART CATH AND CORONARY ANGIOGRAPHY N/A 11/18/2019   Procedure: LEFT HEART CATH AND CORONARY ANGIOGRAPHY;  Surgeon: Jettie Booze, MD;  Location: [place] CV LAB;  Service: Cardiovascular;  Laterality: N/A;   TEE WITHOUT CARDIOVERSION N/A 11/20/2019   Procedure: TRANSESOPHAGEAL  ECHOCARDIOGRAM (TEE);  Surgeon: Wonda Olds, MD;  Location: [place];  Service: Open Heart Surgery;  Laterality: N/A;    There were no vitals filed for this visit.     DGI and 10 meter walk test performed today Step taps with L UE support: 1st step: 10x, 2nd step: 10x, L only Steps: up and down with one rail support: 8 steps, pt was coming down with L LE first when desending stairs, pt educated to come down with R LE first when coming down Sit to stand: 5x with 1 UE support, 5x with no UE support with cues for "nose over toes" and min A for froward lean and lift off Walking fwd and bwd: 5 x 10 no AD and CGA                            PT Short Term Goals - 02/17/21 1044       PT SHORT TERM GOAL #1   Title Patient to become I in HEP (all STGs due 02/19/21)    Baseline TBD;    Time 5    Period Weeks    Status Achieved    Target Date 02/19/21      PT SHORT TERM GOAL #2   Title Assess DGI and set appropriate goal; 01/27/21 Goal is 15/24    Baseline 01/20/21:  9/24 with cane/AFO; 12/24 (02/17/21)    Time 4    Period Weeks    Status Partially Met    Target Date 02/19/21      PT SHORT TERM GOAL #3   Title Patient to ambulate 253ft with R AFO and S    Baseline 186ft with R AFO and SBA    Time 4    Period Weeks    Status Achieved    Target Date 02/19/21      PT SHORT TERM GOAL #4   Title Obtain R AFO consult    Baseline TBD; 01/27/21 Spoke with [facility], AFO consult in progess    Time 4    Period Weeks    Status Achieved    Target Date 02/19/21               PT Long Term Goals - 02/17/21 1044       PT LONG TERM GOAL #1   Title Increase gait speed to 0.3 m/s with R AFO and no AD    Baseline 0.21 m/s gait speed; 0.60m/s (02/17/21)    Time 8    Period Weeks    Status Achieved      PT LONG TERM GOAL #2   Title Patient able to perfom 5x STS in 20s    Baseline 5x STS in 26.6s; 16 seconds with use of 1 HHA on mat table;    Time 8     Period Weeks    Status Partially Met      PT LONG TERM GOAL #3   Title Patient to ambulate 523ft with R AFO and SBA    Baseline 162ft with R AFO and SBA    Time 8    Period Weeks    Status On-going      PT LONG TERM GOAL #4   Title Assess progress towards DGI    Baseline TBD    Time 8    Period Weeks    Status New                   Plan - 02/17/21 1050     Clinical Impression Statement STG and LTG assessed today. Pt demonstrated improved gait speed and DGI scores today. Pt will benefit from continued PT to improve sit to stand transfers with decreased UE support and improve balance on  R LE    Personal Factors and Comorbidities Age;Comorbidity 1;Time since onset of injury/illness/exacerbation    Examination-Activity Limitations Stand;Stairs;Locomotion Level    Examination-Participation Restrictions Shop    Stability/Clinical Decision Making Stable/Uncomplicated    Rehab Potential Good    PT Frequency 1x / week    PT Duration 8 weeks    PT Treatment/Interventions ADLs/Self Care Home Management;DME Instruction;Gait training;Stair training;Functional mobility training;Therapeutic activities;Therapeutic exercise;Balance training;Neuromuscular re-education;Patient/family education;Orthotic Fit/Training    PT Next Visit Plan Continue SLS stance tasks and stepping activities, advance to more dynamic exercises as tolerated, f/u on brace appointment, stair training, focus on proper form and R foot placement startegies, recheck cane height for comfotr    PT Home Exercise Plan [id number]    Consulted and Agree with Plan of Care Patient;Family member/caregiver             Patient will benefit from skilled therapeutic intervention in order to improve the following deficits and impairments:  Abnormal gait, Decreased endurance, Impaired tone, Decreased activity tolerance, Decreased strength, Decreased balance, Decreased mobility, Difficulty walking, Increased muscle spasms  Visit  Diagnosis: Spastic hemiplegia  of right dominant side as late effect of cerebral infarction (HCC)  Muscle weakness (generalized)  Unsteadiness on feet  Other abnormalities of gait and mobility     Problem List Patient Active Problem List   Diagnosis Date Noted   Atherosclerotic heart disease of native coronary artery without angina pectoris 08/01/2020   Cerebrovascular accident [facility]) 08/01/2020   Constipation 08/01/2020   Mixed hyperlipidemia 08/01/2020   Personal history of colonic polyps 08/01/2020   Protein-calorie malnutrition, severe 12/02/2019   Paroxysmal atrial fibrillation (HCC)    Hx of CABG 11/20/2019   Pressure injury of skin 11/18/2019   NSTEMI (non-ST elevated myocardial infarction) ([place]) 11/17/2019   Elevated troponin 11/03/2019   PVD (peripheral vascular disease) ([place]) 11/03/2019   History of CVA with residual deficit 11/03/2019   Elevated CK 11/03/2019   Fall at home, initial encounter 11/03/2019   Prediabetes 11/03/2019   Essential hypertension 11/03/2019   PAD (peripheral artery disease) ([place]) 05/11/2019    [name] 02/17/2021, 10:55 AM  [facility] [address] [place], [place], [address] Phone: [phone]   Fax:  [phone]  Name: [name] MRN: [id number] Date of Birth: 1935/11/28

## 2021-02-17 NOTE — Patient Instructions (Addendum)
[name] should wear his wrist brace during the daytime as needed  He should wear the resting hand splint(hard splint)at night  [name] should remove his resting hand splint if he develops pain, pressure areas or redness that does not resolve in 10 -15 mins of removal.   If any problems he should stop wearing the resting hand splint and bring it back to therapy.

## 2021-02-23 ENCOUNTER — Ambulatory Visit: Payer: Medicare PPO | Admitting: Nurse Practitioner

## 2021-02-23 ENCOUNTER — Ambulatory Visit (INDEPENDENT_AMBULATORY_CARE_PROVIDER_SITE_OTHER): Payer: Medicare PPO

## 2021-02-23 ENCOUNTER — Encounter: Payer: Self-pay | Admitting: Nurse Practitioner

## 2021-02-23 ENCOUNTER — Other Ambulatory Visit: Payer: Self-pay

## 2021-02-23 VITALS — BP 118/58 | HR 55 | Temp 98.0°F | Ht 67.2 in | Wt 171.3 lb

## 2021-02-23 VITALS — BP 118/58 | HR 55 | Temp 98.0°F | Ht 67.2 in | Wt 171.2 lb

## 2021-02-23 DIAGNOSIS — E782 Mixed hyperlipidemia: Secondary | ICD-10-CM | POA: Diagnosis not present

## 2021-02-23 DIAGNOSIS — R7303 Prediabetes: Secondary | ICD-10-CM | POA: Diagnosis not present

## 2021-02-23 DIAGNOSIS — R32 Unspecified urinary incontinence: Secondary | ICD-10-CM

## 2021-02-23 DIAGNOSIS — Z0000 Encounter for general adult medical examination without abnormal findings: Secondary | ICD-10-CM | POA: Diagnosis not present

## 2021-02-23 DIAGNOSIS — G8191 Hemiplegia, unspecified affecting right dominant side: Secondary | ICD-10-CM | POA: Diagnosis not present

## 2021-02-23 LAB — POCT UA - MICROALBUMIN
Albumin/Creatinine Ratio, Urine, POC: 300
Creatinine, POC: 200 mg/dL
Microalbumin Ur, POC: 80 mg/L

## 2021-02-23 LAB — POCT URINALYSIS DIPSTICK
Bilirubin, UA: NEGATIVE
Glucose, UA: NEGATIVE
Ketones, UA: NEGATIVE
Leukocytes, UA: NEGATIVE
Nitrite, UA: NEGATIVE
Protein, UA: POSITIVE — AB
Spec Grav, UA: 1.015 (ref 1.010–1.025)
Urobilinogen, UA: 0.2 E.U./dL
pH, UA: 5.5 (ref 5.0–8.0)

## 2021-02-23 NOTE — Patient Instructions (Signed)
[name] , Thank you for taking time to come for your Medicare Wellness Visit. I appreciate your ongoing commitment to your health goals. Please review the following plan we discussed and let me know if I can assist you in the future.   Screening recommendations/referrals: Colonoscopy: not required Recommended yearly ophthalmology/optometry visit for glaucoma screening and checkup Recommended yearly dental visit for hygiene and checkup  Vaccinations: Influenza vaccine: due 03/06/2021 Pneumococcal vaccine: completed 09/18/2017 Tdap vaccine: completed 11/03/2019, due 11/02/2029 Shingles vaccine: declined   Covid-19:  08/20/2020, 01/12/2020, 12/10/2019  Advanced directives: Please bring a copy of your POA (Power of Attorney) and/or Living Will to your next appointment.   Conditions/risks identified: none  Next appointment: Follow up in one year for your annual wellness visit.   Preventive Care 28 Years and Older, Male Preventive care refers to lifestyle choices and visits with your health care provider that can promote health and wellness. What does preventive care include? A yearly physical exam. This is also called an annual well check. Dental exams once or twice a year. Routine eye exams. Ask your health care provider how often you should have your eyes checked. Personal lifestyle choices, including: Daily care of your teeth and gums. Regular physical activity. Eating a healthy diet. Avoiding tobacco and drug use. Limiting alcohol use. Practicing safe sex. Taking low doses of aspirin every day. Taking vitamin and mineral supplements as recommended by your health care provider. What happens during an annual well check? The services and screenings done by your health care provider during your annual well check will depend on your age, overall health, lifestyle risk factors, and family history of disease. Counseling  Your health care provider may ask you questions about your: Alcohol  use. Tobacco use. Drug use. Emotional well-being. Home and relationship well-being. Sexual activity. Eating habits. History of falls. Memory and ability to understand (cognition). Work and work Astronomer. Screening  You may have the following tests or measurements: Height, weight, and BMI. Blood pressure. Lipid and cholesterol levels. These may be checked every 5 years, or more frequently if you are over 61 years old. Skin check. Lung cancer screening. You may have this screening every year starting at age 76 if you have a 30-pack-year history of smoking and currently smoke or have quit within the past 15 years. Fecal occult blood test (FOBT) of the stool. You may have this test every year starting at age 79. Flexible sigmoidoscopy or colonoscopy. You may have a sigmoidoscopy every 5 years or a colonoscopy every 10 years starting at age 43. Prostate cancer screening. Recommendations will vary depending on your family history and other risks. Hepatitis C blood test. Hepatitis B blood test. Sexually transmitted disease (STD) testing. Diabetes screening. This is done by checking your blood sugar (glucose) after you have not eaten for a while (fasting). You may have this done every 1-3 years. Abdominal aortic aneurysm (AAA) screening. You may need this if you are a current or former smoker. Osteoporosis. You may be screened starting at age 53 if you are at high risk. Talk with your health care provider about your test results, treatment options, and if necessary, the need for more tests. Vaccines  Your health care provider may recommend certain vaccines, such as: Influenza vaccine. This is recommended every year. Tetanus, diphtheria, and acellular pertussis (Tdap, Td) vaccine. You may need a Td booster every 10 years. Zoster vaccine. You may need this after age 26. Pneumococcal 13-valent conjugate (PCV13) vaccine. One dose is recommended  after age 52. Pneumococcal polysaccharide  (PPSV23) vaccine. One dose is recommended after age 35. Talk to your health care provider about which screenings and vaccines you need and how often you need them. This information is not intended to replace advice given to you by your health care provider. Make sure you discuss any questions you have with your health care provider. Document Released: 08/19/2015 Document Revised: 04/11/2016 Document Reviewed: 05/24/2015 Elsevier Interactive Patient Education  2017 [employer] Prevention in the Home Falls can cause injuries. They can happen to people of all ages. There are many things you can do to make your home safe and to help prevent falls. What can I do on the outside of my home? Regularly fix the edges of walkways and driveways and fix any cracks. Remove anything that might make you trip as you walk through a door, such as a raised step or threshold. Trim any bushes or trees on the path to your home. Use bright outdoor lighting. Clear any walking paths of anything that might make someone trip, such as rocks or tools. Regularly check to see if handrails are loose or broken. Make sure that both sides of any steps have handrails. Any raised decks and porches should have guardrails on the edges. Have any leaves, snow, or ice cleared regularly. Use sand or salt on walking paths during winter. Clean up any spills in your garage right away. This includes oil or grease spills. What can I do in the bathroom? Use night lights. Install grab bars by the toilet and in the tub and shower. Do not use towel bars as grab bars. Use non-skid mats or decals in the tub or shower. If you need to sit down in the shower, use a plastic, non-slip stool. Keep the floor dry. Clean up any water that spills on the floor as soon as it happens. Remove soap buildup in the tub or shower regularly. Attach bath mats securely with double-sided non-slip rug tape. Do not have throw rugs and other things on the  floor that can make you trip. What can I do in the bedroom? Use night lights. Make sure that you have a light by your bed that is easy to reach. Do not use any sheets or blankets that are too big for your bed. They should not hang down onto the floor. Have a firm chair that has side arms. You can use this for support while you get dressed. Do not have throw rugs and other things on the floor that can make you trip. What can I do in the kitchen? Clean up any spills right away. Avoid walking on wet floors. Keep items that you use a lot in easy-to-reach places. If you need to reach something above you, use a strong step stool that has a grab bar. Keep electrical cords out of the way. Do not use floor polish or wax that makes floors slippery. If you must use wax, use non-skid floor wax. Do not have throw rugs and other things on the floor that can make you trip. What can I do with my stairs? Do not leave any items on the stairs. Make sure that there are handrails on both sides of the stairs and use them. Fix handrails that are broken or loose. Make sure that handrails are as long as the stairways. Check any carpeting to make sure that it is firmly attached to the stairs. Fix any carpet that is loose or worn. Avoid having throw  rugs at the top or bottom of the stairs. If you do have throw rugs, attach them to the floor with carpet tape. Make sure that you have a light switch at the top of the stairs and the bottom of the stairs. If you do not have them, ask someone to add them for you. What else can I do to help prevent falls? Wear shoes that: Do not have high heels. Have rubber bottoms. Are comfortable and fit you well. Are closed at the toe. Do not wear sandals. If you use a stepladder: Make sure that it is fully opened. Do not climb a closed stepladder. Make sure that both sides of the stepladder are locked into place. Ask someone to hold it for you, if possible. Clearly mark and make  sure that you can see: Any grab bars or handrails. First and last steps. Where the edge of each step is. Use tools that help you move around (mobility aids) if they are needed. These include: Canes. Walkers. Scooters. Crutches. Turn on the lights when you go into a dark area. Replace any light bulbs as soon as they burn out. Set up your furniture so you have a clear path. Avoid moving your furniture around. If any of your floors are uneven, fix them. If there are any pets around you, be aware of where they are. Review your medicines with your doctor. Some medicines can make you feel dizzy. This can increase your chance of falling. Ask your doctor what other things that you can do to help prevent falls. This information is not intended to replace advice given to you by your health care provider. Make sure you discuss any questions you have with your health care provider. Document Released: 05/19/2009 Document Revised: 12/29/2015 Document Reviewed: 08/27/2014 Elsevier Interactive Patient Education  2017 [employer].

## 2021-02-23 NOTE — Progress Notes (Signed)
I,[name] as a Neurosurgeon for [employer], FNP.,have documented all relevant documentation on the behalf of William Felts, FNP,as directed by  William Felts, FNP while in the presence of William Felts, FNP.  This visit occurred during the SARS-CoV-2 public health emergency.  Safety protocols were in place, including screening questions prior to the visit, additional usage of staff PPE, and extensive cleaning of exam room while observing appropriate contact time as indicated for disinfecting solutions.  Subjective:     Patient ID: [name] , male    DOB: Nov 07, 1935 , 85 y.o.   MRN: [id number]   Chief Complaint  Patient presents with   Hyperlipidemia     HPI  He had a hematoma to his left leg, was off his plavix until it resolved.  He is having monitoring of his vascular disease, he will go once a year for his studies. He did not have a colonoscopy - due to age not planning to have another colonoscopy. He continues to have urinary incontinence and the urologist wants to just watch and monitor. His niece would like to have a biopsy done. Has not had a UTI recently. He continues with PT as they await an appt with the physiocist -  [facility] will refit him for the brace and possibly have botox injections to his arm and leg (family preference) to see if that will restore loose tension.   He had a near fall earlier in the year and had a hematoma to the left leg. Reports it took months to improve.  He is now on CPAP and his pulmonary hypertension has resolved.     Past Medical History:  Diagnosis Date   Arthritis    Bundle branch block    Hypercholesteremia    Hyperlipidemia 2001   Hypertension 2001   Hypertension    Peripheral vascular disease (HCC)    Stroke (HCC)    Stroke (HCC)    2001 - R side deficits     Family History  Problem Relation Age of Onset   Stroke Mother    Stroke Father    Heart attack Father    Cancer Brother      Current Outpatient  Medications:    acetaminophen (TYLENOL) 325 MG tablet, Take 2 tablets (650 mg total) by mouth every 4 (four) hours as needed for headache or mild pain., Disp:  , Rfl:    aspirin EC 81 MG tablet, Take 81 mg by mouth daily., Disp: , Rfl:    cetirizine (ZYRTEC) 10 MG tablet, Take 10 mg by mouth as needed for allergies., Disp: , Rfl:    Cholecalciferol (VITAMIN D) 50 MCG (2000 UT) tablet, Take 2,000 Units by mouth daily., Disp: , Rfl:    clopidogrel (PLAVIX) 75 MG tablet, TAKE 1 TABLET BY MOUTH DAILY, Disp: 90 tablet, Rfl: 1   diclofenac sodium (VOLTAREN) 1 % GEL, Apply 2 g topically 4 (four) times daily. Rub into affected area of foot 2 to 4 times daily, Disp: 100 g, Rfl: 2   fluticasone (FLONASE) 50 MCG/ACT nasal spray, Place 2 sprays into both nostrils daily., Disp: 16 g, Rfl: 2   montelukast (SINGULAIR) 10 MG tablet, TAKE 1 TABLET(10 MG) BY MOUTH DAILY, Disp: 30 tablet, Rfl: 2   polyethylene glycol (MIRALAX / GLYCOLAX) 17 g packet, Take 17 g by mouth daily., Disp: 14 each, Rfl: 0   rosuvastatin (CRESTOR) 20 MG tablet, TAKE 1 TABLET(20 MG) BY MOUTH DAILY, Disp: 30 tablet, Rfl: 3  tamsulosin (FLOMAX) 0.4 MG CAPS capsule, , Disp: , Rfl:    traMADol (ULTRAM) 50 MG tablet, Take 1 tablet (50 mg total) by mouth every 6 (six) hours as needed for moderate pain., Disp: 30 tablet, Rfl: 0   zinc gluconate 50 MG tablet, Take 50 mg by mouth daily., Disp: , Rfl:    No Known Allergies   Review of Systems  Constitutional: Negative.   Respiratory: Negative.    Cardiovascular: Negative.     Today's Vitals   02/23/21 1032  BP: (!) 118/58  Pulse: (!) 55  Temp: 98 F (36.7 C)  Weight: 171 lb 4.8 oz (77.7 kg)  Height: 5' 7.2" (1.707 m)  PainSc: 0-No pain   Body mass index is 26.67 kg/m.   Objective:  Physical Exam Vitals reviewed.  Constitutional:      General: He is not in acute distress.    Appearance: Normal appearance.  Cardiovascular:     Rate and Rhythm: Normal rate and regular rhythm.      Pulses: Normal pulses.     Heart sounds: Normal heart sounds. No murmur heard. Pulmonary:     Effort: Pulmonary effort is normal. No respiratory distress.     Breath sounds: Normal breath sounds.  Musculoskeletal:     Comments: Wearing brace to right lower extremity and right arm hemiparesis  Skin:    Capillary Refill: Capillary refill takes less than 2 seconds.  Neurological:     General: No focal deficit present.     Mental Status: He is alert and oriented to person, place, and time.     Cranial Nerves: No cranial nerve deficit.  Psychiatric:        Mood and Affect: Mood normal.        Behavior: Behavior normal.        Thought Content: Thought content normal.        Judgment: Judgment normal.        Assessment And Plan:     1. Prediabetes Comments: Stable, diet controlled  - Hemoglobin A1c - POCT Urinalysis Dipstick (81002) - POCT UA - Microalbumin  2. Mixed hyperlipidemia Comments: Stable, continue statin and tolerating well - Lipid panel - CMP14+EGFR - Hemoglobin A1c  3. Urinary incontinence, unspecified type Comments: Continue follow up with Urology  4. Right hemiparesis (E. Lopez) Comments: He is being refitted for his brace and has been receiving botox injections    Patient was given opportunity to ask questions. Patient verbalized understanding of the plan and was able to repeat key elements of the plan. All questions were answered to their satisfaction.  William Brine, FNP   I, William Brine, FNP, have reviewed all documentation for this visit. The documentation on 03/31/21 for the exam, diagnosis, procedures, and orders are all accurate and complete.   IF YOU HAVE BEEN REFERRED TO A SPECIALIST, IT MAY TAKE 1-2 WEEKS TO SCHEDULE/PROCESS THE REFERRAL. IF YOU HAVE NOT HEARD FROM US/SPECIALIST IN TWO WEEKS, PLEASE GIVE [place] A CALL AT [phone].   THE PATIENT IS ENCOURAGED TO PRACTICE SOCIAL DISTANCING DUE TO THE COVID-19 PANDEMIC.

## 2021-02-23 NOTE — Progress Notes (Signed)
This visit occurred during the SARS-CoV-2 public health emergency.  Safety protocols were in place, including screening questions prior to the visit, additional usage of staff PPE, and extensive cleaning of exam room while observing appropriate contact time as indicated for disinfecting solutions.  Subjective:   [name] is a 85 y.o. male who presents for Medicare Annual/Subsequent preventive examination.  Review of Systems     Cardiac Risk Factors include: advanced age (>58men, >50 women);dyslipidemia;male gender;sedentary lifestyle     Objective:    Today's Vitals   02/23/21 0951  BP: (!) 118/58  Pulse: (!) 55  Temp: 98 F (36.7 C)  TempSrc: Oral  SpO2: 98%  Weight: 171 lb 3.2 oz (77.7 kg)  Height: 5' 7.2" (1.707 m)   Body mass index is 26.65 kg/m.  Advanced Directives 02/23/2021 01/13/2021 02/18/2020 11/19/2019 11/04/2019 11/03/2019 05/11/2019  Does Patient Have a Medical Advance Directive? Yes Yes Yes No - No Yes  Type of Estate agent of [place];Living will Healthcare Power of [employer] Power of [place] - - - Healthcare Power of Attorney  Does patient want to make changes to medical advance directive? - No - Patient declined - - - - No - Patient declined  Copy of Healthcare Power of Attorney in Chart? No - copy requested - No - copy requested - - - -  Would patient like information on creating a medical advance directive? - - - No - Patient declined No - Patient declined - -    Current Medications (verified) Outpatient Encounter Medications as of 02/23/2021  Medication Sig   acetaminophen (TYLENOL) 325 MG tablet Take 2 tablets (650 mg total) by mouth every 4 (four) hours as needed for headache or mild pain.   aspirin EC 81 MG tablet Take 81 mg by mouth daily.   cetirizine (ZYRTEC) 10 MG tablet Take 10 mg by mouth as needed for allergies.   Cholecalciferol (VITAMIN D) 50 MCG (2000 UT) tablet Take 2,000 Units by mouth daily.   clopidogrel  (PLAVIX) 75 MG tablet TAKE 1 TABLET BY MOUTH DAILY   diclofenac sodium (VOLTAREN) 1 % GEL Apply 2 g topically 4 (four) times daily. Rub into affected area of foot 2 to 4 times daily   fluticasone (FLONASE) 50 MCG/ACT nasal spray Place 2 sprays into both nostrils daily.   montelukast (SINGULAIR) 10 MG tablet TAKE 1 TABLET(10 MG) BY MOUTH DAILY   polyethylene glycol (MIRALAX / GLYCOLAX) 17 g packet Take 17 g by mouth daily.   rosuvastatin (CRESTOR) 20 MG tablet TAKE 1 TABLET(20 MG) BY MOUTH DAILY   tamsulosin (FLOMAX) 0.4 MG CAPS capsule    traMADol (ULTRAM) 50 MG tablet Take 1 tablet (50 mg total) by mouth every 6 (six) hours as needed for moderate pain.   zinc gluconate 50 MG tablet Take 50 mg by mouth daily.   No facility-administered encounter medications on file as of 02/23/2021.    Allergies (verified) Patient has no known allergies.   History: Past Medical History:  Diagnosis Date   Arthritis    Bundle branch block    Hypercholesteremia    Hyperlipidemia 2001   Hypertension 2001   Hypertension    Peripheral vascular disease (HCC)    Stroke [facility])    Stroke (HCC)    2001 - R side deficits   Past Surgical History:  Procedure Laterality Date   ABDOMINAL AORTOGRAM N/A 11/18/2019   Procedure: ABDOMINAL AORTOGRAM;  Surgeon: Corky Crafts, MD;  Location: [facility] INVASIVE CV  LAB;  Service: Cardiovascular;  Laterality: N/A;   CORONARY ARTERY BYPASS GRAFT N/A 11/20/2019   Procedure: CORONARY ARTERY BYPASS GRAFTING (CABG), ON PUMP, TIMES THREE, USING LEFT INTERNAL MAMMARY ARTERY AND ENDOSCOPICALLY HARVESTED RIGHT GREATER SAPHENOUS VEIN;  Surgeon: Linden Dolin, MD;  Location: [facility] OR;  Service: Open Heart Surgery;  Laterality: N/A;   LEFT HEART CATH AND CORONARY ANGIOGRAPHY N/A 11/18/2019   Procedure: LEFT HEART CATH AND CORONARY ANGIOGRAPHY;  Surgeon: Corky Crafts, MD;  Location: [facility] INVASIVE CV LAB;  Service: Cardiovascular;  Laterality: N/A;   TEE WITHOUT CARDIOVERSION N/A  11/20/2019   Procedure: TRANSESOPHAGEAL ECHOCARDIOGRAM (TEE);  Surgeon: Linden Dolin, MD;  Location: [facility] OR;  Service: Open Heart Surgery;  Laterality: N/A;   Family History  Problem Relation Age of Onset   Stroke Mother    Stroke Father    Heart attack Father    Cancer Brother    Social History   Socioeconomic History   Marital status: Single    Spouse name: Not on file   Number of children: Not on file   Years of education: Not on file   Highest education level: Not on file  Occupational History   Occupation: retired  Tobacco Use   Smoking status: Never   Smokeless tobacco: Never  Vaping Use   Vaping Use: Not on file  Substance and Sexual Activity   Alcohol use: Never   Drug use: Never   Sexual activity: Not Currently  Other Topics Concern   Not on file  Social History Narrative   ** Merged History Encounter **       Social Determinants of Health   Financial Resource Strain: Low Risk    Difficulty of Paying Living Expenses: Not hard at all  Food Insecurity: No Food Insecurity   Worried About Programme researcher, broadcasting/film/video in the Last Year: Never true   Ran Out of Food in the Last Year: Never true  Transportation Needs: No Transportation Needs   Lack of Transportation (Medical): No   Lack of Transportation (Non-Medical): No  Physical Activity: Inactive   Days of Exercise per Week: 0 days   Minutes of Exercise per Session: 0 min  Stress: No Stress Concern Present   Feeling of Stress : Not at all  Social Connections: Not on file    Tobacco Counseling Counseling given: Not Answered   Clinical Intake:  Pre-visit preparation completed: Yes  Pain : No/denies pain     Nutritional Status: BMI 25 -29 Overweight Nutritional Risks: None Diabetes: No  How often do you need to have someone help you when you read instructions, pamphlets, or other written materials from your doctor or pharmacy?: 5 - Always  Diabetic? no  Interpreter Needed?: No  Information  entered by :: NAllen LPN   Activities of Daily Living In your present state of health, do you have any difficulty performing the following activities: 02/23/2021  Hearing? Y  Comment needs hearing aides  Vision? N  Difficulty concentrating or making decisions? N  Walking or climbing stairs? Y  Dressing or bathing? N  Doing errands, shopping? Y  Preparing Food and eating ? Y  Using the Toilet? N  In the past six months, have you accidently leaked urine? Y  Do you have problems with loss of bowel control? N  Managing your Medications? Y  Managing your Finances? Y  Housekeeping or managing your Housekeeping? Y  Some recent data might be hidden    Patient Care Team: Christell Constant,  Lolita Cram, FNP as PCP - General (General Practice) Lyn Records, MD as PCP - Cardiology (Cardiology) Quintella Reichert, MD as PCP - Sleep Medicine (Cardiology) Mateo Flow, MD as Consulting Physician (Ophthalmology) [facility], Dory Peru, MD as Attending Physician Jeani Hawking, MD as Consulting Physician (Gastroenterology) [name], FNP (General Practice)  Indicate any recent Medical Services you may have received from other than [facility] providers in the past year (date may be approximate).     Assessment:   This is a routine wellness examination for [name].  Hearing/Vision screen No results found.  Dietary issues and exercise activities discussed: Current Exercise Habits: Home exercise routine, Type of exercise: Other - see comments (pedaling), Time (Minutes): 15, Frequency (Times/Week): 3, Weekly Exercise (Minutes/Week): 45   Goals Addressed             This Visit's Progress    Patient Stated       02/22/2021, more exercise and work on A1C, get hearing aides       Depression Screen PHQ 2/9 Scores 02/23/2021 02/18/2020 02/17/2019 11/18/2018 05/15/2018  PHQ - 2 Score 0 0 0 0 0  PHQ- 9 Score - - 0 - 0    Fall Risk Fall Risk  02/23/2021 12/27/2020 02/18/2020 06/30/2019 02/17/2019  Falls in  the past year? 0 0 1 0 0  Comment - - slipped with socks - -  Number falls in past yr: - - 0 - -  Injury with Fall? - - 1 - -  Risk for fall due to : Impaired balance/gait;Impaired mobility;Medication side effect - History of fall(s);Impaired balance/gait;Impaired mobility - Impaired balance/gait;Medication side effect;Impaired mobility  Follow up Falls evaluation completed;Education provided;Falls prevention discussed - Falls evaluation completed;Education provided;Falls prevention discussed - Falls evaluation completed;Education provided;Falls prevention discussed    FALL RISK PREVENTION PERTAINING TO THE HOME:  Any stairs in or around the home? No  If so, are there any without handrails?  N/a Home free of loose throw rugs in walkways, pet beds, electrical cords, etc? Yes  Adequate lighting in your home to reduce risk of falls? Yes   ASSISTIVE DEVICES UTILIZED TO PREVENT FALLS:  Life alert? No  Use of a cane, walker or w/c? Yes  Grab bars in the bathroom? Yes  Shower chair or bench in shower? Yes  Elevated toilet seat or a handicapped toilet? Yes   TIMED UP AND GO:  Was the test performed? No .    Gait slow and steady with assistive device  Cognitive Function:     6CIT Screen 02/17/2019 05/15/2018  What Year? 4 points 4 points  What month? 3 points 0 points  What time? 0 points 3 points  Count back from 20 4 points 4 points  Months in reverse 4 points 4 points  Repeat phrase 10 points 10 points  Total Score 25 25    Immunizations Immunization History  Administered Date(s) Administered   Influenza, High Dose Seasonal PF 06/28/2018   Influenza-Unspecified 03/24/2018   Moderna Sars-Covid-2 Vaccination 12/10/2019, 01/12/2020   Pneumococcal Conjugate-13 09/18/2017   Tdap 06/22/2013, 11/03/2019    TDAP status: Up to date  Flu Vaccine status: Up to date  Pneumococcal vaccine status: Up to date  Covid-19 vaccine status: Completed vaccines  Qualifies for Shingles  Vaccine? Yes   Zostavax completed No   Shingrix Completed?: No.    Education has been provided regarding the importance of this vaccine. Patient has been advised to call insurance company to determine out of pocket expense if  they have not yet received this vaccine. Advised may also receive vaccine at local pharmacy or Health Dept. Verbalized acceptance and understanding.  Screening Tests Health Maintenance  Topic Date Due   Zoster Vaccines- Shingrix (1 of 2) Never done   COVID-19 Vaccine (3 - Moderna risk series) 02/09/2020   INFLUENZA VACCINE  03/06/2021   TETANUS/TDAP  11/02/2029   PNA vac Low Risk Adult  Completed   HPV VACCINES  Aged Out    Health Maintenance  Health Maintenance Due  Topic Date Due   Zoster Vaccines- Shingrix (1 of 2) Never done   COVID-19 Vaccine (3 - Moderna risk series) 02/09/2020    Colorectal cancer screening: No longer required.   Lung Cancer Screening: (Low Dose CT Chest recommended if Age 46-80 years, 30 pack-year currently smoking OR have quit w/in 15years.) does not qualify.   Lung Cancer Screening Referral: no  Additional Screening:  Hepatitis C Screening: does not qualify;   Vision Screening: Recommended annual ophthalmology exams for early detection of glaucoma and other disorders of the eye. Is the patient up to date with their annual eye exam?  Yes  Who is the provider or what is the name of the office in which the patient attends annual eye exams? Dr. Elmer Picker If pt is not established with a provider, would they like to be referred to a provider to establish care? No .   Dental Screening: Recommended annual dental exams for proper oral hygiene  Community Resource Referral / Chronic Care Management: CRR required this visit?  No   CCM required this visit?  No      Plan:     I have personally reviewed and noted the following in the patient's chart:   Medical and social history Use of alcohol, tobacco or illicit drugs  Current  medications and supplements including opioid prescriptions. Patient is not currently taking opioid prescriptions. Functional ability and status Nutritional status Physical activity Advanced directives List of other physicians Hospitalizations, surgeries, and ER visits in previous 12 months Vitals Screenings to include cognitive, depression, and falls Referrals and appointments  In addition, I have reviewed and discussed with patient certain preventive protocols, quality metrics, and best practice recommendations. A written personalized care plan for preventive services as well as general preventive health recommendations were provided to patient.     Barb Merino, LPN   [id number]   Nurse Notes: 6 CIT not administered patient has learning comprehension impairment

## 2021-02-24 ENCOUNTER — Ambulatory Visit: Payer: Medicare PPO

## 2021-02-24 ENCOUNTER — Ambulatory Visit: Payer: Medicare PPO | Admitting: Occupational Therapy

## 2021-02-24 DIAGNOSIS — M6281 Muscle weakness (generalized): Secondary | ICD-10-CM

## 2021-02-24 DIAGNOSIS — R2689 Other abnormalities of gait and mobility: Secondary | ICD-10-CM

## 2021-02-24 DIAGNOSIS — R278 Other lack of coordination: Secondary | ICD-10-CM

## 2021-02-24 DIAGNOSIS — R2681 Unsteadiness on feet: Secondary | ICD-10-CM | POA: Diagnosis not present

## 2021-02-24 DIAGNOSIS — M25611 Stiffness of right shoulder, not elsewhere classified: Secondary | ICD-10-CM

## 2021-02-24 DIAGNOSIS — M25632 Stiffness of left wrist, not elsewhere classified: Secondary | ICD-10-CM | POA: Diagnosis not present

## 2021-02-24 DIAGNOSIS — M25621 Stiffness of right elbow, not elsewhere classified: Secondary | ICD-10-CM | POA: Diagnosis not present

## 2021-02-24 DIAGNOSIS — I69351 Hemiplegia and hemiparesis following cerebral infarction affecting right dominant side: Secondary | ICD-10-CM

## 2021-02-24 LAB — CMP14+EGFR
ALT: 39 IU/L (ref 0–44)
AST: 34 IU/L (ref 0–40)
Albumin/Globulin Ratio: 2 (ref 1.2–2.2)
Albumin: 4.7 g/dL — ABNORMAL HIGH (ref 3.6–4.6)
Alkaline Phosphatase: 94 IU/L (ref 44–121)
BUN/Creatinine Ratio: 13 (ref 10–24)
BUN: 15 mg/dL (ref 8–27)
Bilirubin Total: 1.3 mg/dL — ABNORMAL HIGH (ref 0.0–1.2)
CO2: 21 mmol/L (ref 20–29)
Calcium: 9.5 mg/dL (ref 8.6–10.2)
Chloride: 105 mmol/L (ref 96–106)
Creatinine, Ser: 1.14 mg/dL (ref 0.76–1.27)
Globulin, Total: 2.4 g/dL (ref 1.5–4.5)
Glucose: 94 mg/dL (ref 65–99)
Potassium: 4.6 mmol/L (ref 3.5–5.2)
Sodium: 141 mmol/L (ref 134–144)
Total Protein: 7.1 g/dL (ref 6.0–8.5)
eGFR: 64 mL/min/{1.73_m2} (ref >59)

## 2021-02-24 LAB — LIPID PANEL
Chol/HDL Ratio: 2.8 ratio (ref 0.0–5.0)
Cholesterol, Total: 144 mg/dL (ref 100–199)
HDL: 51 mg/dL (ref >39)
LDL Chol Calc (NIH): 77 mg/dL (ref 0–99)
Triglycerides: 83 mg/dL (ref 0–149)
VLDL Cholesterol Cal: 16 mg/dL (ref 5–40)

## 2021-02-24 LAB — HEMOGLOBIN A1C
Est. average glucose Bld gHb Est-mCnc: 131 mg/dL
Hgb A1c MFr Bld: 6.2 % — ABNORMAL HIGH (ref 4.8–5.6)

## 2021-02-24 IMAGING — DX DG CHEST 1V PORT
1 series · 1 of 1 positions shown · non-contrast
Comparison: Preoperative radiograph 11/17/2019

CLINICAL DATA: Status post CABG x3.

EXAM:
PORTABLE CHEST 1 VIEW

[chest ap]
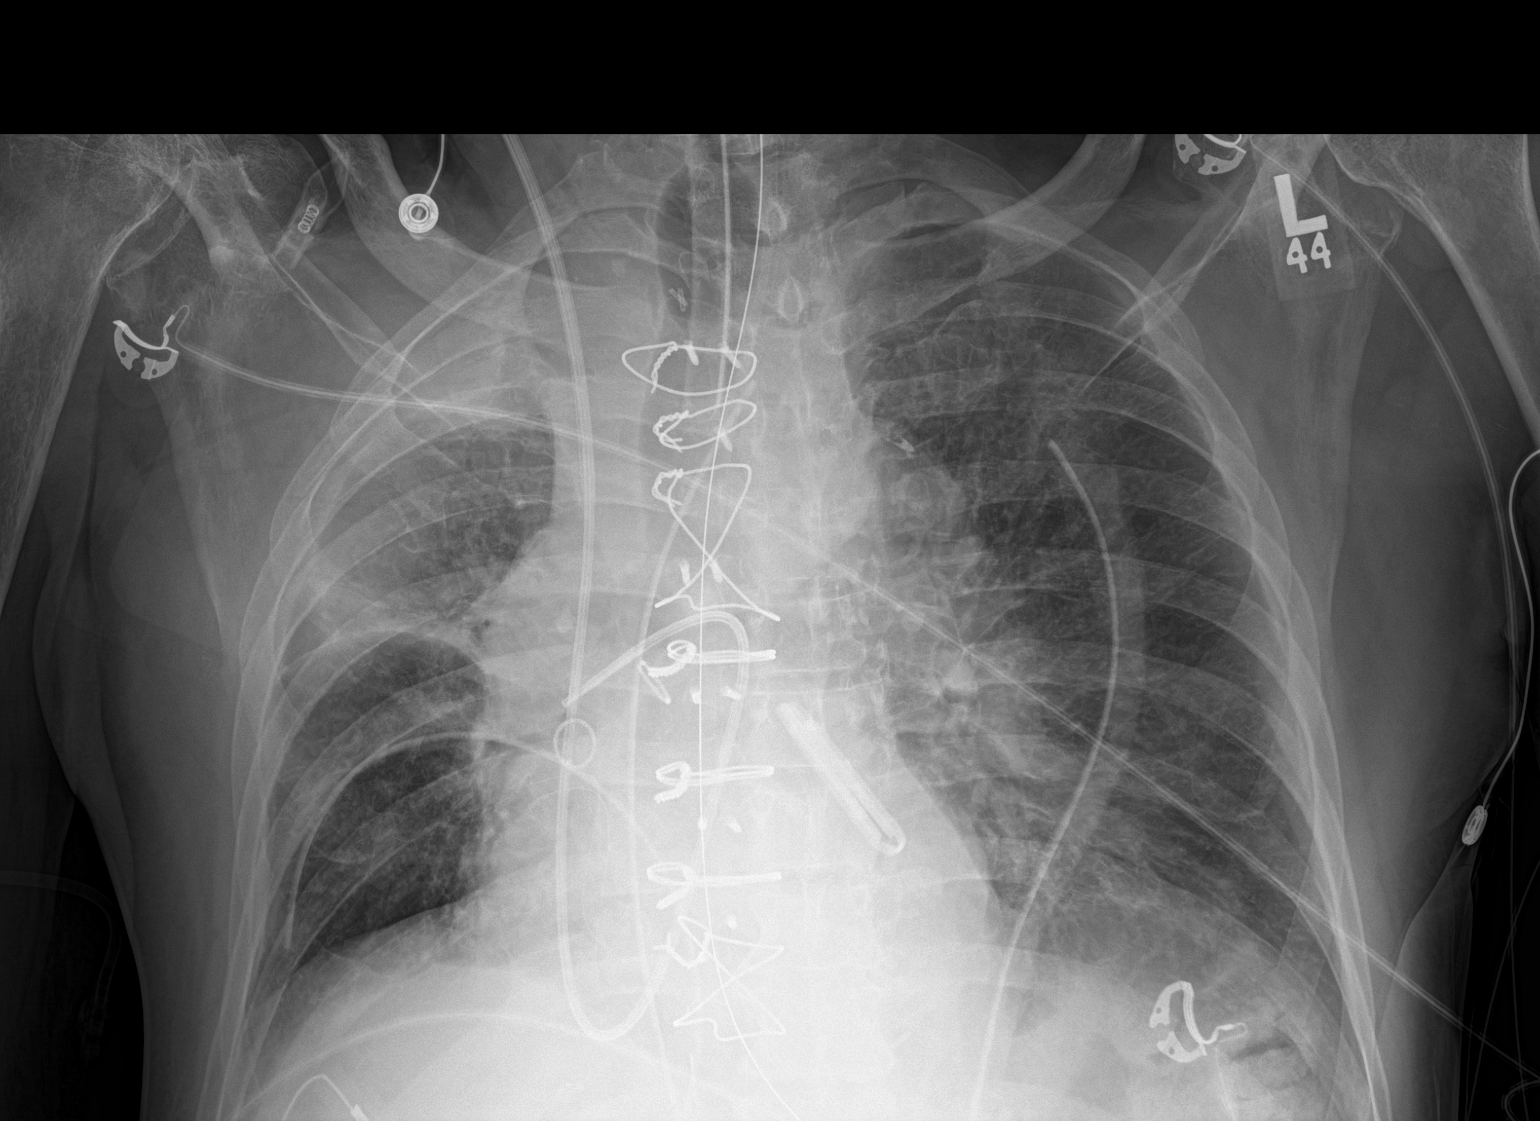

[1 of 1 positions shown; findings below may reference images not displayed]

FINDINGS: Post median sternotomy and CABG. Endotracheal tube tip 5.4 cm from
the carina. Enteric tube in place with tip below the diaphragm not
included in the field of view. Right internal jugular Swan-Ganz
catheter tip in the region of the distal right pulmonary outflow
tract. Bilateral chest tubes. Presumed mediastinal drain projecting
centrally. There is been left atrial clipping. Volume loss in the
right hemithorax with apical opacity suspicious for right upper lobe
collapse. No visualized pneumothorax. Interstitial thickening with
Kerley B-lines in the left lung likely represent pulmonary edema. No
large pleural effusion.
IMPRESSION: 1. Postoperative change from recent CABG and left atrial clipping.
Support apparatus as described. Tip of the Swan-Ganz catheter
appears slightly distal in the right pulmonary outflow tract.
2. Volume loss in the right hemithorax with apical opacity
suspicious for right upper lobe collapse.
3. Mild pulmonary edema.

## 2021-02-24 NOTE — Therapy (Signed)
[facility] [address] [place], [place], [address] Phone: [phone]   Fax:  [phone]  Physical Therapy Treatment  Patient Details  Name: [name] MRN: [id number] Date of Birth: 1936-05-13 Referring Provider (PT): Dr. Wynn Banker   Encounter Date: 02/24/2021   PT End of Session - 02/24/21 1040     Visit Number 7    Number of Visits 9    Date for PT Re-Evaluation 03/19/21    Authorization Type [employer]    Authorization Time Period 01/13/21    Progress Note Due on Visit 9    PT Start Time 1015    PT Stop Time 1100    PT Time Calculation (min) 45 min    Equipment Utilized During Treatment Gait belt    Activity Tolerance Patient tolerated treatment well    Behavior During Therapy [facility] for tasks assessed/performed             Past Medical History:  Diagnosis Date   Arthritis    Bundle branch block    Hypercholesteremia    Hyperlipidemia 2001   Hypertension 2001   Hypertension    Peripheral vascular disease (HCC)    Stroke (HCC)    Stroke (HCC)    2001 - R side deficits    Past Surgical History:  Procedure Laterality Date   ABDOMINAL AORTOGRAM N/A 11/18/2019   Procedure: ABDOMINAL AORTOGRAM;  Surgeon: Corky Crafts, MD;  Location: [facility] INVASIVE CV LAB;  Service: Cardiovascular;  Laterality: N/A;   CORONARY ARTERY BYPASS GRAFT N/A 11/20/2019   Procedure: CORONARY ARTERY BYPASS GRAFTING (CABG), ON PUMP, TIMES THREE, USING LEFT INTERNAL MAMMARY ARTERY AND ENDOSCOPICALLY HARVESTED RIGHT GREATER SAPHENOUS VEIN;  Surgeon: Linden Dolin, MD;  Location: [facility] OR;  Service: Open Heart Surgery;  Laterality: N/A;   LEFT HEART CATH AND CORONARY ANGIOGRAPHY N/A 11/18/2019   Procedure: LEFT HEART CATH AND CORONARY ANGIOGRAPHY;  Surgeon: Corky Crafts, MD;  Location: [facility] INVASIVE CV LAB;  Service: Cardiovascular;  Laterality: N/A;   TEE WITHOUT CARDIOVERSION N/A 11/20/2019   Procedure: TRANSESOPHAGEAL  ECHOCARDIOGRAM (TEE);  Surgeon: Linden Dolin, MD;  Location: [facility] OR;  Service: Open Heart Surgery;  Laterality: N/A;    There were no vitals filed for this visit.   Subjective Assessment - 02/24/21 1034     Subjective no changes, no falls, no pain, saw orthotist    Patient is accompained by: Family member    Pertinent History 85 y/o male , lives home alone (niece next door), presented s/p fall at home (3rd this year). PMHx includes: arthritis, R bundle branch block and bradycardia, HTN, PVD, CVA w/ R side weakness. In ED had contusion of B shoulders and R hip, signs of aortic and corinary artherosclerotic disease. Pt admitted with elevated troponin.    Limitations Sitting    How long can you sit comfortably? unlimited    How long can you stand comfortably? <15 min    How long can you walk comfortably? <15 min    Patient Stated Goals To be able to function better and obtain an new AFO                               [facility] Adult PT Treatment/Exercise - 02/24/21 0001       Ambulation/Gait   Ambulation/Gait Yes    Ambulation/Gait Assistance 5: Supervision;4: Min guard    Ambulation Distance (Feet) 230 Feet  Assistive device Straight cane    Gait Pattern Step-to pattern;Step-through pattern;Decreased stride length;Decreased step length - right;Decreased stance time - left;Decreased hip/knee flexion - right;Right steppage;Decreased trunk rotation;Decreased arm swing - right    Ambulation Surface Level;Indoor    Number of Stairs 8    Pre-Gait Activities Applied R walko-on reaction brace for ambulation trials    Gait Comments improved stair negotiation technique      Knee/Hip Exercises: Seated   Long Arc Quad Strengthening;Both;2 sets;15 reps;Weights    Long Arc Quad Weight 2 lbs.    Heel Slides Strengthening;Both;2 sets;15 reps;Limitations    Heel Slides Limitations towel slides w/ 2#    Marching Strengthening;Both;2 sets;15 reps;Weights    Marching Weights 2  lbs.    Abduction/Adduction  2 sets;15 reps;Limitations    Abd/Adduction Limitations red band                      PT Short Term Goals - 02/17/21 1044       PT SHORT TERM GOAL #1   Title Patient to become I in HEP (all STGs due 02/19/21)    Baseline TBD;    Time 5    Period Weeks    Status Achieved    Target Date 02/19/21      PT SHORT TERM GOAL #2   Title Assess DGI and set appropriate goal; 01/27/21 Goal is 15/24    Baseline 01/20/21: 9/24 with cane/AFO; 12/24 (02/17/21)    Time 4    Period Weeks    Status Partially Met    Target Date 02/19/21      PT SHORT TERM GOAL #3   Title Patient to ambulate 273ft with R AFO and S    Baseline 1104ft with R AFO and SBA    Time 4    Period Weeks    Status Achieved    Target Date 02/19/21      PT SHORT TERM GOAL #4   Title Obtain R AFO consult    Baseline TBD; 01/27/21 Spoke with [facility], AFO consult in progess    Time 4    Period Weeks    Status Achieved    Target Date 02/19/21               PT Long Term Goals - 02/17/21 1044       PT LONG TERM GOAL #1   Title Increase gait speed to 0.3 m/s with R AFO and no AD    Baseline 0.21 m/s gait speed; 0.27m/s (02/17/21)    Time 8    Period Weeks    Status Achieved      PT LONG TERM GOAL #2   Title Patient able to perfom 5x STS in 20s    Baseline 5x STS in 26.6s; 16 seconds with use of 1 HHA on mat table;    Time 8    Period Weeks    Status Partially Met      PT LONG TERM GOAL #3   Title Patient to ambulate 531ft with R AFO and SBA    Baseline 158ft with R AFO and SBA    Time 8    Period Weeks    Status On-going      PT LONG TERM GOAL #4   Title Assess progress towards DGI    Baseline TBD    Time 8    Period Weeks    Status New  Plan - 02/24/21 1110     Clinical Impression Statement Todays session focusd on gait training with R AFO using the Walk-On Reaction brace with patient showing increased R step length while  braced, reviewed stair training emphasizing proper step to pattern and sequence, added weight to LE exercises emphasizing improved control and AROM in RLE. Cane height appropriate for patient.    Personal Factors and Comorbidities Age;Comorbidity 1;Time since onset of injury/illness/exacerbation    Comorbidities Old CVA    Examination-Activity Limitations Stand;Stairs;Locomotion Level    Examination-Participation Restrictions Shop    Stability/Clinical Decision Making Stable/Uncomplicated    Rehab Potential Good    PT Frequency 1x / week    PT Duration 8 weeks    PT Treatment/Interventions ADLs/Self Care Home Management;DME Instruction;Gait training;Stair training;Functional mobility training;Therapeutic activities;Therapeutic exercise;Balance training;Neuromuscular re-education;Patient/family education;Orthotic Fit/Training    PT Next Visit Plan continue to train with OTC AFO and incorporate functional balance training with barace, assess DGI with AFO    PT Home Exercise Plan [email or web address]    Consulted and Agree with Plan of Care Patient;Family member/caregiver             Patient will benefit from skilled therapeutic intervention in order to improve the following deficits and impairments:  Abnormal gait, Decreased endurance, Impaired tone, Decreased activity tolerance, Decreased strength, Decreased balance, Decreased mobility, Difficulty walking, Increased muscle spasms  Visit Diagnosis: Spastic hemiplegia of right dominant side as late effect of cerebral infarction (HCC)  Muscle weakness (generalized)  Unsteadiness on feet  Other abnormalities of gait and mobility     Problem List Patient Active Problem List   Diagnosis Date Noted   Atherosclerotic heart disease of native coronary artery without angina pectoris 08/01/2020   Cerebrovascular accident ([place]) 08/01/2020   Constipation 08/01/2020   Mixed hyperlipidemia 08/01/2020   Personal history of colonic polyps 08/01/2020    Protein-calorie malnutrition, severe 12/02/2019   Paroxysmal atrial fibrillation (HCC)    Hx of CABG 11/20/2019   Pressure injury of skin 11/18/2019   NSTEMI (non-ST elevated myocardial infarction) ([place]) 11/17/2019   Elevated troponin 11/03/2019   PVD (peripheral vascular disease) ([place]) 11/03/2019   History of CVA with residual deficit 11/03/2019   Elevated CK 11/03/2019   Fall at home, initial encounter 11/03/2019   Prediabetes 11/03/2019   Essential hypertension 11/03/2019   PAD (peripheral artery disease) ([place]) 05/11/2019    Lanice Shirts PT 02/24/2021, 11:15 AM  [facility] [address] [place], [place], [address] Phone: [phone]   Fax:  [phone]  Name: ZAKARIYE NEE MRN: [id number] Date of Birth: Aug 11, 1935

## 2021-02-24 NOTE — Therapy (Signed)
[facility] [address] [place], [place], [address] Phone: [phone]   Fax:  [phone]  Occupational Therapy Treatment & Recertification  Patient Details  Name: [name] MRN: [id number] Date of Birth: Jun 05, 1936 Referring Provider (OT): Claudette Laws, MD   Encounter Date: 02/24/2021   OT End of Session - 02/24/21 0938     Visit Number 7    Number of Visits 14    Date for OT Re-Evaluation 04/21/21    Authorization Type [employer]    Authorization Time Period $20 copay- Auth Req'd    OT Start Time [id number]    OT Stop Time 1015    OT Time Calculation (min) 39 min    Activity Tolerance Patient tolerated treatment well    Behavior During Therapy [facility] for tasks assessed/performed             Past Medical History:  Diagnosis Date   Arthritis    Bundle branch block    Hypercholesteremia    Hyperlipidemia 2001   Hypertension 2001   Hypertension    Peripheral vascular disease (HCC)    Stroke (HCC)    Stroke (HCC)    2001 - R side deficits    Past Surgical History:  Procedure Laterality Date   ABDOMINAL AORTOGRAM N/A 11/18/2019   Procedure: ABDOMINAL AORTOGRAM;  Surgeon: Corky Crafts, MD;  Location: [facility] INVASIVE CV LAB;  Service: Cardiovascular;  Laterality: N/A;   CORONARY ARTERY BYPASS GRAFT N/A 11/20/2019   Procedure: CORONARY ARTERY BYPASS GRAFTING (CABG), ON PUMP, TIMES THREE, USING LEFT INTERNAL MAMMARY ARTERY AND ENDOSCOPICALLY HARVESTED RIGHT GREATER SAPHENOUS VEIN;  Surgeon: Linden Dolin, MD;  Location: [facility] OR;  Service: Open Heart Surgery;  Laterality: N/A;   LEFT HEART CATH AND CORONARY ANGIOGRAPHY N/A 11/18/2019   Procedure: LEFT HEART CATH AND CORONARY ANGIOGRAPHY;  Surgeon: Corky Crafts, MD;  Location: [facility] INVASIVE CV LAB;  Service: Cardiovascular;  Laterality: N/A;   TEE WITHOUT CARDIOVERSION N/A 11/20/2019   Procedure: TRANSESOPHAGEAL ECHOCARDIOGRAM (TEE);  Surgeon:  Linden Dolin, MD;  Location: [facility] OR;  Service: Open Heart Surgery;  Laterality: N/A;    There were no vitals filed for this visit.   Subjective Assessment - 02/24/21 0937     Subjective  "i'm doing good"    Patient is accompanied by: --   niece, Robyn   Limitations Hard of Hearing. Reads Lips. No Driving. Fall Risk    Patient Stated Goals Pt's niece reported coming for AFO consult and for discussing RUE functional use    Currently in Pain? No/denies                          OT Treatments/Exercises (OP) - 02/24/21 0941       ADLs   Functional Mobility working with pt and hanger for addressing need for new AFO - niece present. Pt is donning and doffing AFO independently but having difficulty d/t bulkiness and velcro. Continue to assess with team and interdisciplinary approach.      Neurological Re-education Exercises   Other Grasp and Release Exercises  worked on active grasp/reelease with 1 inch blocks. Pt able to grasp and release 1 inch blocks with min facilitation and assistance from therapist. Pt with increased composite extension of fingers and working towards increasing functional use of RUE.      Splinting   Splinting pt and niece report patient doing well with splint at this time  Manual Therapy   Manual Therapy Passive ROM    Passive ROM RUE elbow (bicep), wrist flexors and finger flexors.                      OT Short Term Goals - 02/24/21 0954       OT SHORT TERM GOAL #1   Title Pt and/or caregiver will be independent with HEP for stretching for RUE    Time 4    Period Weeks    Status Achieved    Target Date 02/10/21      OT SHORT TERM GOAL #2   Title Pt will demonstrate ability to don cpap mask with mod I    Time 4    Period Weeks    Status Achieved      OT SHORT TERM GOAL #3   Title Pt will demonstrate ability to don and doff any splints PRN    Time 4    Period Weeks    Status Achieved      OT SHORT TERM GOAL #4    Title Pt will demonstrate improved passive elbow extension of -100 degrees or greater.    Baseline -105*    Time 4    Period Weeks    Status New               OT Long Term Goals - 02/24/21 1237       OT LONG TERM GOAL #1   Title Pt and caregiver will be independent with wear and care of splints PRN    Time 8    Period Weeks    Status On-going      OT LONG TERM GOAL #2   Title Pt will increase passive range of motion of elbow extension to -95 degrees or greater    Baseline -105*    Time 8    Period Weeks    Status New      OT LONG TERM GOAL #3   Title Pt will increase functional use of RUE by performing box and blocks with score of 6 or greater with RUE.    Baseline 3 blocks with R    Time 8    Period Weeks    Status New      OT LONG TERM GOAL #4   Title Pt will improve passive shoulder flexion to 60* or greater in order in RUE    Baseline 40*    Time 8    Period Weeks    Status On-going      OT LONG TERM GOAL #5   Title Pt will report using RUE for stabilization and assisting with functional tasks at least 50% of the day.    Time 8    Period Weeks    Status On-going                   Plan - 02/24/21 1235     Clinical Impression Statement Recertification today to account for extra visits s/p Botox scheduled for 7/28 per pt's niece. Pt would benefit from continued therapy after botox for maximizing effects and increasing range of motion and functional use of RUE. Pt has met 3/4 STGs and is progressing towards LTGs.    OT Occupational Profile and History Problem Focused Assessment - Including review of records relating to presenting problem    Occupational performance deficits (Please refer to evaluation for details): IADL's;ADL's;Leisure    Body Structure / Function / Physical Skills Tone;UE functional  use;Flexibility;Mobility;Coordination;Strength;ROM;ADL;IADL;Dexterity;Decreased knowledge of use of DME    Rehab Potential Fair   late effects of CVA    Clinical Decision Making Limited treatment options, no task modification necessary    Comorbidities Affecting Occupational Performance: None    Modification or Assistance to Complete Evaluation  No modification of tasks or assist necessary to complete eval    OT Frequency 1x / week    OT Duration 8 weeks    OT Treatment/Interventions Self-care/ADL training;Fluidtherapy;Moist Heat;DME and/or AE instruction;Splinting;Patient/family education;Therapeutic activities;Therapeutic exercise;Neuromuscular education;Passive range of motion;Paraffin;Manual Therapy    Plan continue stretching/ self ROM, botox scheduled for 7/28 - on hold until 8/5 or after for botox, renewal completed 7/22    Consulted and Agree with Plan of Care Patient             Patient will benefit from skilled therapeutic intervention in order to improve the following deficits and impairments:   Body Structure / Function / Physical Skills: Tone, UE functional use, Flexibility, Mobility, Coordination, Strength, ROM, ADL, IADL, Dexterity, Decreased knowledge of use of DME       Visit Diagnosis: Spastic hemiplegia of right dominant side as late effect of cerebral infarction (HCC)  Muscle weakness (generalized)  Other abnormalities of gait and mobility  Unsteadiness on feet  Stiffness of left wrist, not elsewhere classified  Stiffness of right shoulder, not elsewhere classified  Other lack of coordination  Stiffness of right elbow, not elsewhere classified  Hemiplegia and hemiparesis following cerebral infarction affecting right dominant side [facility])    Problem List Patient Active Problem List   Diagnosis Date Noted   Atherosclerotic heart disease of native coronary artery without angina pectoris 08/01/2020   Cerebrovascular accident ([place]) 08/01/2020   Constipation 08/01/2020   Mixed hyperlipidemia 08/01/2020   Personal history of colonic polyps 08/01/2020   Protein-calorie malnutrition, severe 12/02/2019    Paroxysmal atrial fibrillation (HCC)    Hx of CABG 11/20/2019   Pressure injury of skin 11/18/2019   NSTEMI (non-ST elevated myocardial infarction) ([place]) 11/17/2019   Elevated troponin 11/03/2019   PVD (peripheral vascular disease) ([place]) 11/03/2019   History of CVA with residual deficit 11/03/2019   Elevated CK 11/03/2019   Fall at home, initial encounter 11/03/2019   Prediabetes 11/03/2019   Essential hypertension 11/03/2019   PAD (peripheral artery disease) ([place]) 05/11/2019    Zachery Conch MOT, OTR/L  02/24/2021, 1:07 PM  [facility] [address] [place], [place], [address] Phone: [phone]   Fax:  [phone]  Name: RONIE BARNHART MRN: [id number] Date of Birth: May 24, 1936

## 2021-02-25 IMAGING — DX DG CHEST 1V PORT
1 series · 1 of 1 positions shown · non-contrast
Comparison: 11/20/2019

CLINICAL DATA: CABG on 11/20/2019

EXAM:
PORTABLE CHEST 1 VIEW

[chest]
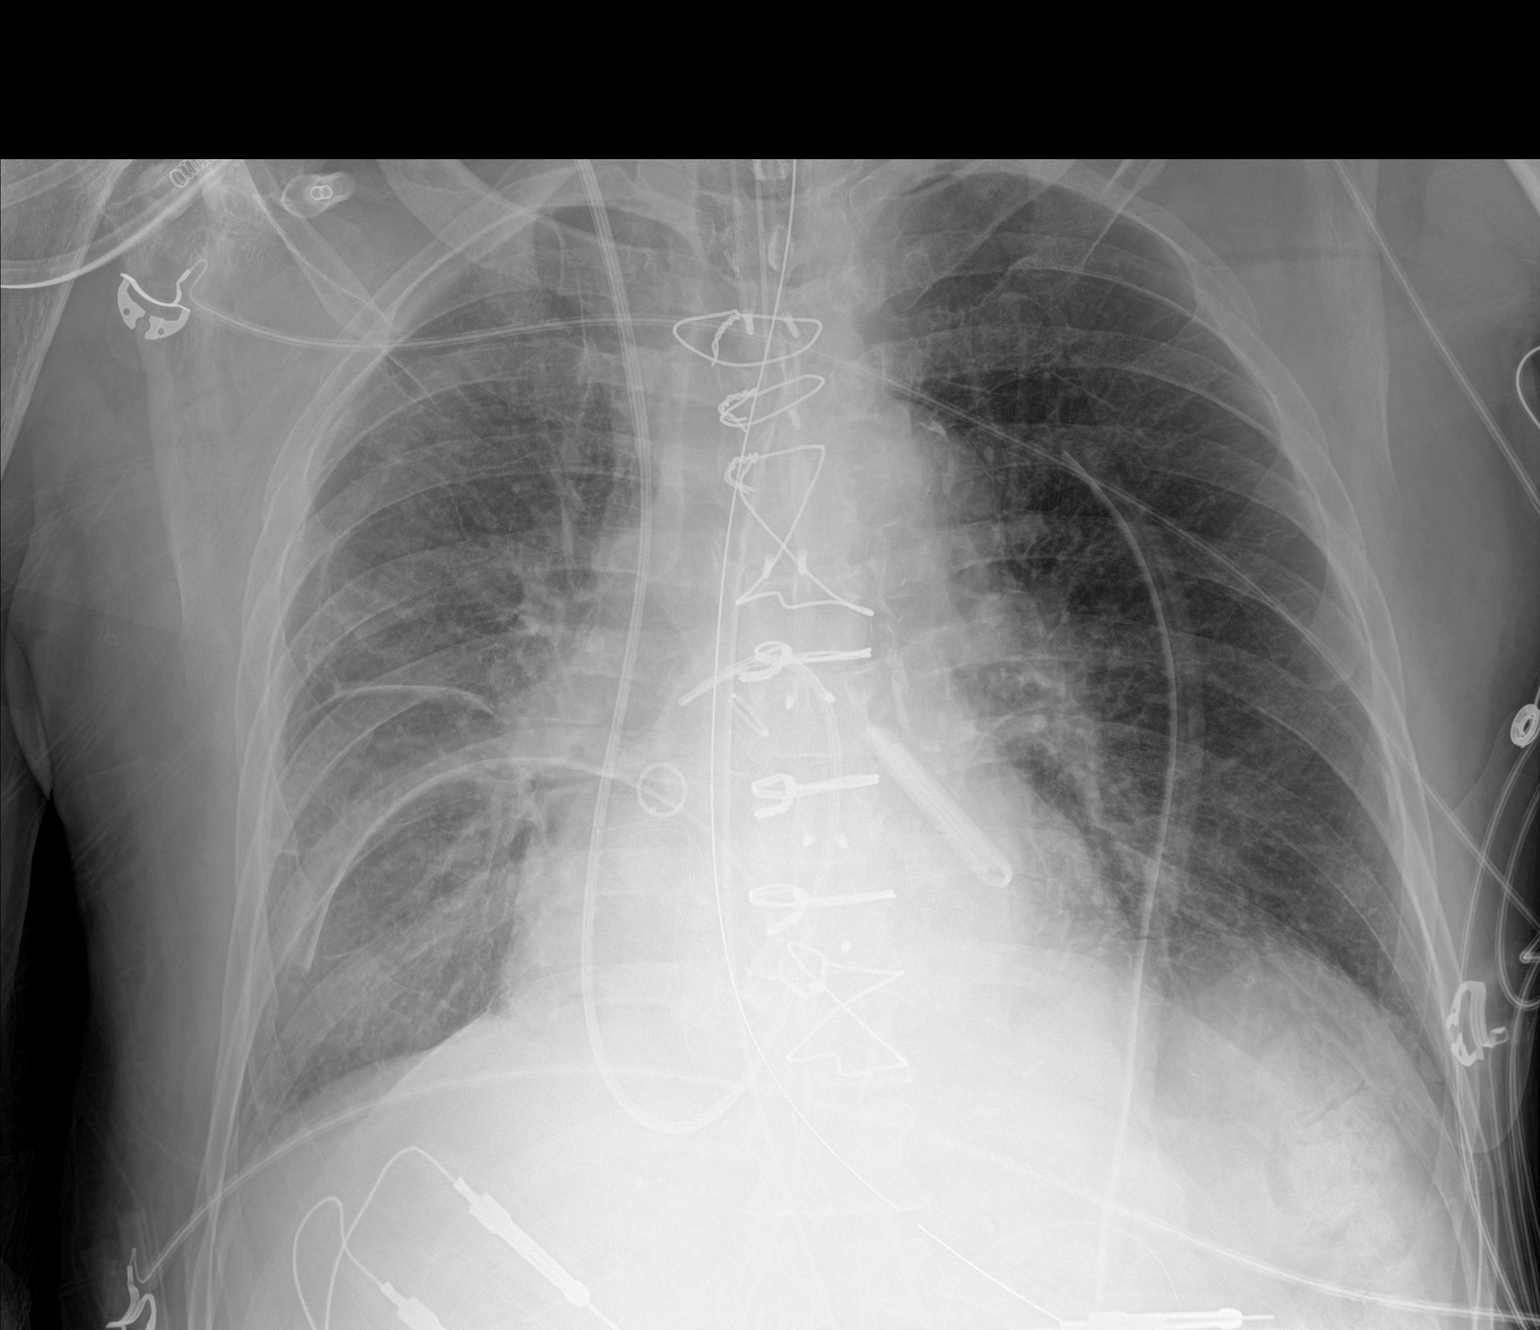

[1 of 1 positions shown; findings below may reference images not displayed]

FINDINGS: Endotracheal tube terminates 7.1 cm above carina. Nasogastric tube
extends beyond the inferior aspect of the film. Right internal
jugular Swan-Ganz catheter appropriately position. Bilateral chest
tubes. Status post median sternotomy and left atrial appendage
occlusion. CABG. Normal heart size. No pleural effusion or
pneumothorax. Asymmetric interstitial edema is mild and greater on
the right. Fluid tracks on the right minor fissure. Bibasilar
atelectasis. Resolved right upper lobe collapse.
IMPRESSION: Slight increase in mild interstitial edema with resolved right upper
lobe collapse. Bibasilar atelectasis remains.

Bilateral chest tubes in place, without pneumothorax.

## 2021-02-28 IMAGING — DX DG CHEST 1V PORT
1 series · 1 of 1 positions shown · non-contrast
Comparison: Same day.

CLINICAL DATA: Status post cardiac surgery.

EXAM:
PORTABLE CHEST 1 VIEW

[chest ap]
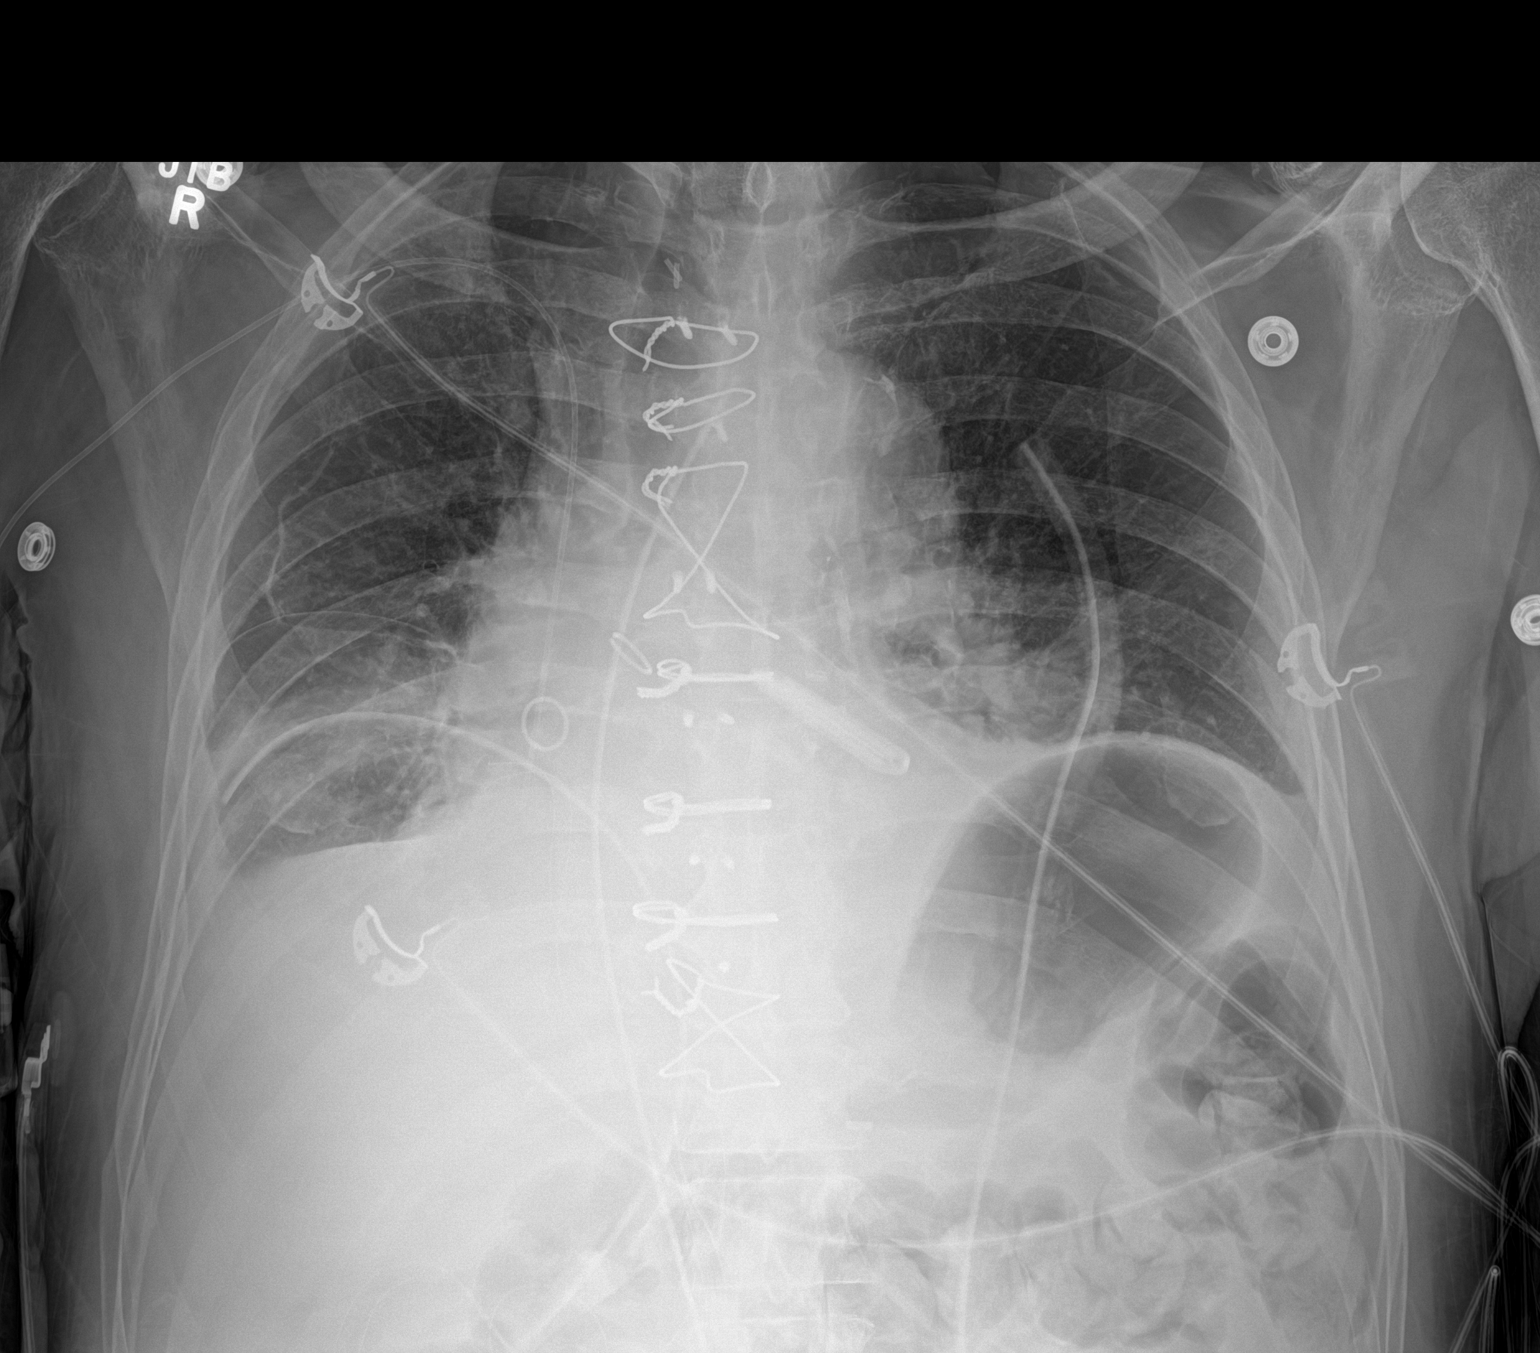

[1 of 1 positions shown; findings below may reference images not displayed]

FINDINGS: Stable cardiomediastinal silhouette. Bilateral chest tubes are noted
without pneumothorax. Mild bibasilar subsegmental atelectasis is
noted with probable small pleural effusions. Right-sided PICC line
is unchanged in position. Endotracheal and nasogastric tubes have
been removed. Bony thorax is unremarkable.
IMPRESSION: Bilateral chest tubes are unchanged without pneumothorax. Mild
bibasilar subsegmental atelectasis is noted with probable small
pleural effusions. Endotracheal and nasogastric tubes have been
removed.

## 2021-03-02 ENCOUNTER — Other Ambulatory Visit: Payer: Self-pay

## 2021-03-02 ENCOUNTER — Encounter: Payer: Self-pay | Admitting: Physical Medicine & Rehabilitation

## 2021-03-02 ENCOUNTER — Encounter: Payer: Medicare PPO | Attending: Physical Medicine & Rehabilitation | Admitting: Physical Medicine & Rehabilitation

## 2021-03-02 VITALS — BP 144/76 | HR 52 | Temp 98.4°F | Ht 70.0 in | Wt 171.0 lb

## 2021-03-02 DIAGNOSIS — G8111 Spastic hemiplegia affecting right dominant side: Secondary | ICD-10-CM | POA: Insufficient documentation

## 2021-03-02 NOTE — Patient Instructions (Signed)
Will block the RIght musculocutaneous nerve  to see if the elbow flexion is contracture vs spasticity

## 2021-03-02 NOTE — Progress Notes (Signed)
Botox Injection for spasticity using needle EMG guidance  Dilution: 50 Units/ml Indication: Severe spasticity which interferes with ADL,mobility and/or  hygiene and is unresponsive to medication management and other conservative care Informed consent was obtained after describing risks and benefits of the procedure with the patient. This includes bleeding, bruising, infection, excessive weakness, or medication side effects. A REMS form is on file and signed. Needle: 25g 2" needle electrode Number of units per muscle RIght Upper  FCR50 1+ EMG FCU50 1+ EMG  Right LE 'post tib 100U 1+ EMG All injections were done after obtaining appropriate EMG activity and after negative drawback for blood. The patient tolerated the procedure well. Post procedure instructions were given. A followup appointment was made.    Patient with right elbow MAS for spasticity discussed that difficult to tell whether this is contracture versus severe spasticity, will do right musculocutaneous nerve block with lidocaine

## 2021-03-03 ENCOUNTER — Ambulatory Visit: Payer: Medicare PPO | Admitting: Physical Therapy

## 2021-03-03 ENCOUNTER — Encounter: Payer: Medicare PPO | Admitting: Occupational Therapy

## 2021-03-07 ENCOUNTER — Ambulatory Visit: Payer: Medicare PPO | Admitting: Physical Therapy

## 2021-03-07 ENCOUNTER — Encounter: Payer: Medicare PPO | Admitting: Occupational Therapy

## 2021-03-10 ENCOUNTER — Ambulatory Visit (INDEPENDENT_AMBULATORY_CARE_PROVIDER_SITE_OTHER): Payer: Medicare PPO | Admitting: Podiatry

## 2021-03-10 ENCOUNTER — Encounter: Payer: Self-pay | Admitting: Podiatry

## 2021-03-10 ENCOUNTER — Other Ambulatory Visit: Payer: Self-pay

## 2021-03-10 DIAGNOSIS — M79675 Pain in left toe(s): Secondary | ICD-10-CM

## 2021-03-10 DIAGNOSIS — M79674 Pain in right toe(s): Secondary | ICD-10-CM

## 2021-03-10 DIAGNOSIS — Q828 Other specified congenital malformations of skin: Secondary | ICD-10-CM | POA: Diagnosis not present

## 2021-03-10 DIAGNOSIS — B351 Tinea unguium: Secondary | ICD-10-CM | POA: Diagnosis not present

## 2021-03-10 DIAGNOSIS — I739 Peripheral vascular disease, unspecified: Secondary | ICD-10-CM | POA: Diagnosis not present

## 2021-03-12 NOTE — Progress Notes (Signed)
  Subjective:  Patient ID: [name], male    DOB: [birth year],  MRN: [id number]  [name] presents to clinic today for for at risk foot care. Patient has h/o PAD and painful porokeratotic lesion(s) plantar aspect of left foot and right 3rd digit. He also has painful mycotic toenails that limit ambulation. Painful toenails interfere with ambulation. Aggravating factors include wearing enclosed shoe gear. Pain is relieved with periodic professional debridement. Painful porokeratotic lesions are aggravated when weightbearing with and without shoegear. Pain is relieved with periodic professional debridement.  His niece, Weldon Picking, is present during today's visit. Patient states he attempted to trim his toenails.  PCP is [name], FNP , and last visit was 05/30/2020.  No Known Allergies  Review of Systems: Negative except as noted in the HPI. Objective:   Constitutional TRACEY STEWART is a pleasant 85 y.o. African American male, WD, WN in NAD. AAO x 3.   Vascular Capillary refill time to digits <4 seconds b/l lower extremities. Faintly palpable DP pulse(s) b/l lower extremities. Faintly palpable PT pulse(s) b/l lower extremities. Lower extremity skin temperature gradient within normal limits. No pain with calf compression b/l. Trace edema noted b/l lower extremities. No cyanosis or clubbing noted.  Neurologic Normal speech. Oriented to person, place, and time. Protective sensation intact 5/5 intact bilaterally with 10g monofilament b/l.  Dermatologic Pedal skin with normal turgor, texture and tone b/l lower extremities. Toenails 1-5 b/l elongated, discolored, dystrophic, thickened, crumbly with subungual debris and tenderness to dorsal palpation. Porokeratotic lesion(s) R 3rd toe and submet head 2 right foot. No erythema, no edema, no drainage, no fluctuance.  Orthopedic: Normal muscle strength 5/5 to all lower extremity muscle groups bilaterally. Hallux valgus with bunion  deformity noted b/l lower extremities. Hammertoe(s) noted to the 2-5 bilaterally.   Vascular US ABIs w/wo TBI study 06/16//2022  Right ABI: 0.62 Left ABI: 0.92 Right TBI: 0.32 Left TBI: 0.55  Right LE: moderate right lower extremity arterial disease Left LE:  mild left lower extremity arterial disease  Radiographs: None Assessment:   1. Pain due to onychomycosis of toenails of both feet   2. Porokeratosis   3. PAD (peripheral artery disease) (HCC)    Plan:  -Examined patient. -Counseled patient to avoid trimming toenails due to his circulatory status. Patient related understanding.. -Patient to continue soft, supportive shoe gear daily. -Toenails 1-5 b/l were debrided in length and girth with sterile nail nippers and dremel without iatrogenic bleeding.  -Painful porokeratotic lesion(s) R 3rd toe and submet head 2 right foot pared and enucleated with sterile scalpel blade without incident. Total number of lesions debrided=2. -Patient to report any pedal injuries to medical professional immediately. -Patient/POA to call should there be question/concern in the interim.  Return in about 3 months (around 06/10/2021).  Freddie Breech, DPM

## 2021-03-14 ENCOUNTER — Telehealth: Payer: Self-pay

## 2021-03-14 DIAGNOSIS — I739 Peripheral vascular disease, unspecified: Secondary | ICD-10-CM | POA: Diagnosis not present

## 2021-03-14 DIAGNOSIS — G4733 Obstructive sleep apnea (adult) (pediatric): Secondary | ICD-10-CM | POA: Diagnosis not present

## 2021-03-14 NOTE — Telephone Encounter (Signed)
The pt's daughter Charna Busman was notified that the prescription for the wheel chair batteries is ready for pickup.

## 2021-03-17 ENCOUNTER — Other Ambulatory Visit: Payer: Self-pay | Admitting: Nurse Practitioner

## 2021-03-17 ENCOUNTER — Other Ambulatory Visit: Payer: Self-pay

## 2021-03-17 ENCOUNTER — Ambulatory Visit: Payer: Medicare PPO | Admitting: Occupational Therapy

## 2021-03-17 ENCOUNTER — Ambulatory Visit: Payer: Medicare PPO | Attending: Nurse Practitioner

## 2021-03-17 DIAGNOSIS — M6281 Muscle weakness (generalized): Secondary | ICD-10-CM | POA: Insufficient documentation

## 2021-03-17 DIAGNOSIS — R2689 Other abnormalities of gait and mobility: Secondary | ICD-10-CM | POA: Diagnosis not present

## 2021-03-17 DIAGNOSIS — R278 Other lack of coordination: Secondary | ICD-10-CM | POA: Insufficient documentation

## 2021-03-17 DIAGNOSIS — M25611 Stiffness of right shoulder, not elsewhere classified: Secondary | ICD-10-CM | POA: Insufficient documentation

## 2021-03-17 DIAGNOSIS — I69351 Hemiplegia and hemiparesis following cerebral infarction affecting right dominant side: Secondary | ICD-10-CM | POA: Diagnosis not present

## 2021-03-17 DIAGNOSIS — M25632 Stiffness of left wrist, not elsewhere classified: Secondary | ICD-10-CM | POA: Insufficient documentation

## 2021-03-17 DIAGNOSIS — R2681 Unsteadiness on feet: Secondary | ICD-10-CM | POA: Insufficient documentation

## 2021-03-17 DIAGNOSIS — M25621 Stiffness of right elbow, not elsewhere classified: Secondary | ICD-10-CM | POA: Diagnosis not present

## 2021-03-17 NOTE — Therapy (Signed)
[facility] [address] [place], [place], [address] Phone: [phone]   Fax:  [phone]  Physical Therapy Treatment  Patient Details  Name: [name] MRN: [id number] Date of Birth: 24-Jan-1936 Referring Provider (PT): Dr. Letta Pate   Encounter Date: 03/17/2021   PT End of Session - 03/17/21 1626     Visit Number 8    Number of Visits 9    Date for PT Re-Evaluation 03/19/21    Authorization Type [employer]    Authorization Time Period 01/13/21    Progress Note Due on Visit 9    PT Start Time 1530    PT Stop Time 1615    PT Time Calculation (min) 45 min    Equipment Utilized During Treatment Gait belt    Activity Tolerance Patient tolerated treatment well    Behavior During Therapy [facility] for tasks assessed/performed             Past Medical History:  Diagnosis Date   Arthritis    Bundle branch block    Hypercholesteremia    Hyperlipidemia 2001   Hypertension 2001   Hypertension    Peripheral vascular disease ([place])    Stroke ([place])    Stroke ([place])    2001 - R side deficits    Past Surgical History:  Procedure Laterality Date   ABDOMINAL AORTOGRAM N/A 11/18/2019   Procedure: ABDOMINAL AORTOGRAM;  Surgeon: Jettie Booze, MD;  Location: [facility] CV LAB;  Service: Cardiovascular;  Laterality: N/A;   CORONARY ARTERY BYPASS GRAFT N/A 11/20/2019   Procedure: CORONARY ARTERY BYPASS GRAFTING (CABG), ON PUMP, TIMES THREE, USING LEFT INTERNAL MAMMARY ARTERY AND ENDOSCOPICALLY HARVESTED RIGHT GREATER SAPHENOUS VEIN;  Surgeon: Wonda Olds, MD;  Location: [facility];  Service: Open Heart Surgery;  Laterality: N/A;   LEFT HEART CATH AND CORONARY ANGIOGRAPHY N/A 11/18/2019   Procedure: LEFT HEART CATH AND CORONARY ANGIOGRAPHY;  Surgeon: Jettie Booze, MD;  Location: [facility] CV LAB;  Service: Cardiovascular;  Laterality: N/A;   TEE WITHOUT CARDIOVERSION N/A 11/20/2019   Procedure: TRANSESOPHAGEAL  ECHOCARDIOGRAM (TEE);  Surgeon: Wonda Olds, MD;  Location: [facility];  Service: Open Heart Surgery;  Laterality: N/A;    There were no vitals filed for this visit.   Subjective Assessment - 03/17/21 1549     Subjective CG wishes to practice with AFO over several sessions before commiting to a brace.    Patient is accompained by: Family member                               [facility] Adult PT Treatment/Exercise - 03/17/21 1603       Transfers   Transfers Sit to Stand    Sit to Stand 5: Supervision    Stand to Sit 5: Supervision      Ambulation/Gait   Ambulation/Gait Yes    Ambulation/Gait Assistance 5: Supervision    Ambulation Distance (Feet) 230 Feet    Assistive device Straight cane    Gait Pattern Step-to pattern;Step-through pattern;Decreased stride length;Decreased step length - right;Decreased stance time - left;Decreased hip/knee flexion - right;Right steppage;Decreased trunk rotation;Decreased arm swing - right    Ambulation Surface Level;Indoor    Pre-Gait Activities used AFO R    Gait Comments TCs to extend R hip further and and lengthen L stride      Self-Care   Self-Care Other Self-Care Comments    Other Self-Care Comments  demoed donning/doffing AFO to patiet and CG patient able to don/dof original w/o assist      Knee/Hip Exercises: Aerobic   Other Aerobic Scifit L1 seat 20 x6'                 Balance Exercises - 03/17/21 0001       Balance Exercises: Standing   Step Ups Forward;Lateral;2 inch;Intermittent UE support;Limitations    Step Ups Limitations performed in // bars, 10x ea. LE in ea. direction.  patient apprehensive requiring single UE support                 PT Short Term Goals - 02/17/21 1044       PT SHORT TERM GOAL #1   Title Patient to become I in HEP (all STGs due 02/19/21)    Baseline TBD;    Time 5    Period Weeks    Status Achieved    Target Date 02/19/21      PT SHORT TERM GOAL #2   Title Assess  DGI and set appropriate goal; 01/27/21 Goal is 15/24    Baseline 01/20/21: 9/24 with cane/AFO; 12/24 (02/17/21)    Time 4    Period Weeks    Status Partially Met    Target Date 02/19/21      PT SHORT TERM GOAL #3   Title Patient to ambulate 246ft with R AFO and S    Baseline 167ft with R AFO and SBA    Time 4    Period Weeks    Status Achieved    Target Date 02/19/21      PT SHORT TERM GOAL #4   Title Obtain R AFO consult    Baseline TBD; 01/27/21 Spoke with [facility], AFO consult in progess    Time 4    Period Weeks    Status Achieved    Target Date 02/19/21               PT Long Term Goals - 02/17/21 1044       PT LONG TERM GOAL #1   Title Increase gait speed to 0.3 m/s with R AFO and no AD    Baseline 0.21 m/s gait speed; 0.52m/s (02/17/21)    Time 8    Period Weeks    Status Achieved      PT LONG TERM GOAL #2   Title Patient able to perfom 5x STS in 20s    Baseline 5x STS in 26.6s; 16 seconds with use of 1 HHA on mat table;    Time 8    Period Weeks    Status Partially Met      PT LONG TERM GOAL #3   Title Patient to ambulate 539ft with R AFO and SBA    Baseline 167ft with R AFO and SBA    Time 8    Period Weeks    Status On-going      PT LONG TERM GOAL #4   Title Assess progress towards DGI    Baseline TBD    Time 8    Period Weeks    Status New                   Plan - 03/17/21 1552     Clinical Impression Statement Continued to work with Walk-on raection AFO adding Scifit and gait training to session.  Incorporated balance and single leg stance tasks using 2" step.  Ambulation with new AFO demoed increased R stance  time and increased L step length.  Patient apprehensive about SLS tasks requiring single UE support    Personal Factors and Comorbidities Age;Comorbidity 1;Time since onset of injury/illness/exacerbation    Comorbidities Old CVA    Examination-Activity Limitations Stand;Stairs;Locomotion Level    Examination-Participation  Restrictions Shop    Stability/Clinical Decision Making Stable/Uncomplicated    Rehab Potential Good    PT Frequency 1x / week    PT Duration 8 weeks    PT Treatment/Interventions ADLs/Self Care Home Management;DME Instruction;Gait training;Stair training;Functional mobility training;Therapeutic activities;Therapeutic exercise;Balance training;Neuromuscular re-education;Patient/family education;Orthotic Fit/Training    PT Next Visit Plan continue to train with OTC AFO and incorporate functional balance training with brace, assess DGI with AFO, STSGs    PT Home Exercise Plan [id number]    Consulted and Agree with Plan of Care Patient;Family member/caregiver             Patient will benefit from skilled therapeutic intervention in order to improve the following deficits and impairments:  Abnormal gait, Decreased endurance, Impaired tone, Decreased activity tolerance, Decreased strength, Decreased balance, Decreased mobility, Difficulty walking, Increased muscle spasms  Visit Diagnosis: Spastic hemiplegia of right dominant side as late effect of cerebral infarction (HCC)  Muscle weakness (generalized)  Unsteadiness on feet     Problem List Patient Active Problem List   Diagnosis Date Noted   Atherosclerotic heart disease of native coronary artery without angina pectoris 08/01/2020   Cerebrovascular accident ([place]) 08/01/2020   Constipation 08/01/2020   Mixed hyperlipidemia 08/01/2020   Personal history of colonic polyps 08/01/2020   Protein-calorie malnutrition, severe 12/02/2019   Paroxysmal atrial fibrillation (HCC)    Hx of CABG 11/20/2019   Pressure injury of skin 11/18/2019   NSTEMI (non-ST elevated myocardial infarction) ([place]) 11/17/2019   Elevated troponin 11/03/2019   PVD (peripheral vascular disease) ([place]) 11/03/2019   History of CVA with residual deficit 11/03/2019   Elevated CK 11/03/2019   Fall at home, initial encounter 11/03/2019   Prediabetes 11/03/2019    Essential hypertension 11/03/2019   PAD (peripheral artery disease) ([place]) 05/11/2019    Lanice Shirts PT 03/17/2021, 4:29 PM  [facility] [address] [place], [place], [address] Phone: [phone]   Fax:  [phone]  Name: [name] MRN: [id number] Date of Birth: 1936/04/24

## 2021-03-17 NOTE — Therapy (Signed)
[facility] [address] [place], [place], [address] Phone: [phone]   Fax:  [phone]  Occupational Therapy Treatment  Patient Details  Name: [name] MRN: [id number] Date of Birth: 02-Mar-1936 Referring Provider (OT): Claudette Laws, MD   Encounter Date: 03/17/2021   OT End of Session - 03/17/21 1459     Visit Number 8    Number of Visits 14    Date for OT Re-Evaluation 04/21/21    Authorization Type [employer]    Authorization Time Period $20 copay- Auth Req'd    OT Start Time 1457    OT Stop Time 1530    OT Time Calculation (min) 33 min    Activity Tolerance Patient tolerated treatment well    Behavior During Therapy [facility] for tasks assessed/performed             Past Medical History:  Diagnosis Date   Arthritis    Bundle branch block    Hypercholesteremia    Hyperlipidemia 2001   Hypertension 2001   Hypertension    Peripheral vascular disease (HCC)    Stroke (HCC)    Stroke (HCC)    2001 - R side deficits    Past Surgical History:  Procedure Laterality Date   ABDOMINAL AORTOGRAM N/A 11/18/2019   Procedure: ABDOMINAL AORTOGRAM;  Surgeon: Corky Crafts, MD;  Location: [facility] INVASIVE CV LAB;  Service: Cardiovascular;  Laterality: N/A;   CORONARY ARTERY BYPASS GRAFT N/A 11/20/2019   Procedure: CORONARY ARTERY BYPASS GRAFTING (CABG), ON PUMP, TIMES THREE, USING LEFT INTERNAL MAMMARY ARTERY AND ENDOSCOPICALLY HARVESTED RIGHT GREATER SAPHENOUS VEIN;  Surgeon: Linden Dolin, MD;  Location: [facility] OR;  Service: Open Heart Surgery;  Laterality: N/A;   LEFT HEART CATH AND CORONARY ANGIOGRAPHY N/A 11/18/2019   Procedure: LEFT HEART CATH AND CORONARY ANGIOGRAPHY;  Surgeon: Corky Crafts, MD;  Location: [facility] INVASIVE CV LAB;  Service: Cardiovascular;  Laterality: N/A;   TEE WITHOUT CARDIOVERSION N/A 11/20/2019   Procedure: TRANSESOPHAGEAL ECHOCARDIOGRAM (TEE);  Surgeon: Linden Dolin,  MD;  Location: [facility] OR;  Service: Open Heart Surgery;  Laterality: N/A;    There were no vitals filed for this visit.   Subjective Assessment - 03/17/21 1459     Subjective  Pt got botox a couple weeks ago (7/28)    Patient is accompanied by: --   niece, Robyn   Limitations Hard of Hearing. Reads Lips. No Driving. Fall Risk    Patient Stated Goals Pt's niece reported coming for AFO consult and for discussing RUE functional use    Currently in Pain? No/denies                          OT Treatments/Exercises (OP) - 03/17/21 1537       Neurological Re-education Exercises   Other Grasp and Release Exercises  active grasp and release of several objects of various sshapes and sizes .Pt have to grasp and maintain sustained grasp on object with RUE but unable to demonstrate sufficient supination or elbow extension for any reaching or level and neutral hold      Manual Therapy   Manual Therapy Passive ROM    Passive ROM s/p botox 7/28 stretching to LUE to assess post botox. Pt with some improvement with wrist and hand with FCR and FCU botox injection.  OT Short Term Goals - 02/24/21 0954       OT SHORT TERM GOAL #1   Title Pt and/or caregiver will be independent with HEP for stretching for RUE    Time 4    Period Weeks    Status Achieved    Target Date 02/10/21      OT SHORT TERM GOAL #2   Title Pt will demonstrate ability to don cpap mask with mod I    Time 4    Period Weeks    Status Achieved      OT SHORT TERM GOAL #3   Title Pt will demonstrate ability to don and doff any splints PRN    Time 4    Period Weeks    Status Achieved      OT SHORT TERM GOAL #4   Title Pt will demonstrate improved passive elbow extension of -100 degrees or greater.    Baseline -105*    Time 4    Period Weeks    Status New               OT Long Term Goals - 02/24/21 1237       OT LONG TERM GOAL #1   Title Pt and caregiver will be  independent with wear and care of splints PRN    Time 8    Period Weeks    Status On-going      OT LONG TERM GOAL #2   Title Pt will increase passive range of motion of elbow extension to -95 degrees or greater    Baseline -105*    Time 8    Period Weeks    Status New      OT LONG TERM GOAL #3   Title Pt will increase functional use of RUE by performing box and blocks with score of 6 or greater with RUE.    Baseline 3 blocks with R    Time 8    Period Weeks    Status New      OT LONG TERM GOAL #4   Title Pt will improve passive shoulder flexion to 60* or greater in order in RUE    Baseline 40*    Time 8    Period Weeks    Status On-going      OT LONG TERM GOAL #5   Title Pt will report using RUE for stabilization and assisting with functional tasks at least 50% of the day.    Time 8    Period Weeks    Status On-going                   Plan - 03/17/21 1522     Clinical Impression Statement Pt with some improvement from botox. Pt limited with functional use of RUE d/t elbow/bicep tone and possible contracture. Planning to get nerve block in October wiht Dr. Wynn Banker.    OT Occupational Profile and History Problem Focused Assessment - Including review of records relating to presenting problem    Occupational performance deficits (Please refer to evaluation for details): IADL's;ADL's;Leisure    Body Structure / Function / Physical Skills Tone;UE functional use;Flexibility;Mobility;Coordination;Strength;ROM;ADL;IADL;Dexterity;Decreased knowledge of use of DME    Rehab Potential Fair   late effects of CVA   Clinical Decision Making Limited treatment options, no task modification necessary    Comorbidities Affecting Occupational Performance: None    Modification or Assistance to Complete Evaluation  No modification of tasks or assist necessary to complete eval  OT Frequency 1x / week    OT Duration 8 weeks    OT Treatment/Interventions Self-care/ADL  training;Fluidtherapy;Moist Heat;DME and/or AE instruction;Splinting;Patient/family education;Therapeutic activities;Therapeutic exercise;Neuromuscular education;Passive range of motion;Paraffin;Manual Therapy    Plan try estim RUE, stretching RUE s/p botox, review HEP    Consulted and Agree with Plan of Care Patient;Family member/caregiver    Family Member Consulted niece, [name]             Patient will benefit from skilled therapeutic intervention in order to improve the following deficits and impairments:   Body Structure / Function / Physical Skills: Tone, UE functional use, Flexibility, Mobility, Coordination, Strength, ROM, ADL, IADL, Dexterity, Decreased knowledge of use of DME       Visit Diagnosis: Spastic hemiplegia of right dominant side as late effect of cerebral infarction (HCC)  Stiffness of right elbow, not elsewhere classified  Stiffness of left wrist, not elsewhere classified  Stiffness of right shoulder, not elsewhere classified  Other lack of coordination    Problem List Patient Active Problem List   Diagnosis Date Noted   Atherosclerotic heart disease of native coronary artery without angina pectoris 08/01/2020   Cerebrovascular accident (HCC) 08/01/2020   Constipation 08/01/2020   Mixed hyperlipidemia 08/01/2020   Personal history of colonic polyps 08/01/2020   Protein-calorie malnutrition, severe 12/02/2019   Paroxysmal atrial fibrillation (HCC)    Hx of CABG 11/20/2019   Pressure injury of skin 11/18/2019   NSTEMI (non-ST elevated myocardial infarction) (HCC) 11/17/2019   Elevated troponin 11/03/2019   PVD (peripheral vascular disease) (HCC) 11/03/2019   History of CVA with residual deficit 11/03/2019   Elevated CK 11/03/2019   Fall at home, initial encounter 11/03/2019   Prediabetes 11/03/2019   Essential hypertension 11/03/2019   PAD (peripheral artery disease) (HCC) 05/11/2019    Junious Dresser MOT, OTR/L  03/17/2021, 3:39  PM  [facility] [address] [place], [place], [address] Phone: [phone]   Fax:  [phone]  Name: [name] MRN: [id number] Date of Birth: 10-22-1935

## 2021-03-21 ENCOUNTER — Encounter: Payer: Self-pay | Admitting: Nurse Practitioner

## 2021-03-21 ENCOUNTER — Ambulatory Visit: Payer: Medicare PPO | Admitting: Nurse Practitioner

## 2021-03-21 ENCOUNTER — Other Ambulatory Visit: Payer: Self-pay

## 2021-03-21 VITALS — BP 132/78 | HR 76 | Temp 98.1°F | Ht 70.0 in | Wt 177.2 lb

## 2021-03-21 DIAGNOSIS — R2232 Localized swelling, mass and lump, left upper limb: Secondary | ICD-10-CM | POA: Diagnosis not present

## 2021-03-21 NOTE — Progress Notes (Signed)
I,Jovannie Ulibarri,acting as a Neurosurgeon for [name], FNP.,have documented all relevant documentation on the behalf of [name], FNP,as directed by  [name], FNP while in the presence of [name], FNP.  This visit occurred during the SARS-CoV-2 public health emergency.  Safety protocols were in place, including screening questions prior to the visit, additional usage of staff PPE, and extensive cleaning of exam room while observing appropriate contact time as indicated for disinfecting solutions.  Subjective:     Patient ID: [name] , male    DOB: 1936/04/01 , 85 y.o.   MRN: [id number]   Chief Complaint  Patient presents with   Mass    Right bicep.      HPI  Pt presents today with his niece and noticed a bulge 2 weeks ago to his left bicep. Reports no falls, or other injuries that could have caused this.  Unsure of how long has been present. Denies pain or fever.     Past Medical History:  Diagnosis Date   Arthritis    Bundle branch block    Hypercholesteremia    Hyperlipidemia 2001   Hypertension 2001   Hypertension    Peripheral vascular disease (HCC)    Stroke (HCC)    Stroke (HCC)    2001 - R side deficits     Family History  Problem Relation Age of Onset   Stroke Mother    Stroke Father    Heart attack Father    Cancer Brother      Current Outpatient Medications:    acetaminophen (TYLENOL) 325 MG tablet, Take 2 tablets (650 mg total) by mouth every 4 (four) hours as needed for headache or mild pain., Disp:  , Rfl:    aspirin EC 81 MG tablet, Take 81 mg by mouth daily., Disp: , Rfl:    cetirizine (ZYRTEC) 10 MG tablet, Take 10 mg by mouth as needed for allergies., Disp: , Rfl:    Cholecalciferol (VITAMIN D) 50 MCG (2000 UT) tablet, Take 2,000 Units by mouth daily., Disp: , Rfl:    clopidogrel (PLAVIX) 75 MG tablet, TAKE 1 TABLET BY MOUTH DAILY, Disp: 90 tablet, Rfl: 1   diclofenac sodium (VOLTAREN) 1 % GEL, Apply 2 g topically 4 (four) times  daily. Rub into affected area of foot 2 to 4 times daily, Disp: 100 g, Rfl: 2   fluticasone (FLONASE) 50 MCG/ACT nasal spray, Place 2 sprays into both nostrils daily., Disp: 16 g, Rfl: 2   montelukast (SINGULAIR) 10 MG tablet, TAKE 1 TABLET(10 MG) BY MOUTH DAILY, Disp: 30 tablet, Rfl: 2   polyethylene glycol (MIRALAX / GLYCOLAX) 17 g packet, Take 17 g by mouth daily., Disp: 14 each, Rfl: 0   rosuvastatin (CRESTOR) 20 MG tablet, TAKE 1 TABLET(20 MG) BY MOUTH DAILY, Disp: 30 tablet, Rfl: 3   tamsulosin (FLOMAX) 0.4 MG CAPS capsule, , Disp: , Rfl:    traMADol (ULTRAM) 50 MG tablet, Take 1 tablet (50 mg total) by mouth every 6 (six) hours as needed for moderate pain., Disp: 30 tablet, Rfl: 0   zinc gluconate 50 MG tablet, Take 50 mg by mouth daily., Disp: , Rfl:    No Known Allergies   Review of Systems  Constitutional: Negative.   HENT: Negative.    Eyes: Negative.   Respiratory: Negative.    Cardiovascular: Negative.   Gastrointestinal: Negative.   Endocrine: Negative.   Genitourinary: Negative.   Musculoskeletal: Negative.   Skin: Negative.   Allergic/Immunologic: Negative.  Neurological: Negative.   Hematological: Negative.   Psychiatric/Behavioral: Negative.      Today's Vitals   03/21/21 1639  BP: 132/78  Pulse: 76  Temp: 98.1 F (36.7 C)  TempSrc: Oral  Weight: 177 lb 3.2 oz (80.4 kg)  Height: 5\' 10"  (1.778 m)  PainSc: 0-No pain   Body mass index is 25.43 kg/m.   Objective:  Physical Exam Vitals reviewed.  Constitutional:      General: He is not in acute distress.    Appearance: Normal appearance.  Cardiovascular:     Pulses: Normal pulses.     Heart sounds: No murmur heard. Pulmonary:     Effort: Pulmonary effort is normal. No respiratory distress.     Breath sounds: Normal breath sounds. No wheezing.  Musculoskeletal:        General: No tenderness.     Comments: Has bulging noted to left bicep with position changes, nontender and non moveable  Skin:     Capillary Refill: Capillary refill takes less than 2 seconds.  Neurological:     General: No focal deficit present.     Mental Status: He is alert and oriented to person, place, and time.     Cranial Nerves: No cranial nerve deficit.     Motor: No weakness.  Psychiatric:        Mood and Affect: Mood normal.        Behavior: Behavior normal.        Thought Content: Thought content normal.        Judgment: Judgment normal.        Assessment And Plan:     1. Mass of left upper extremity Bulging area to left bicep, nonmoveable and nontender Uncertain if this is just muscle or a mass, left arm unable to compare due to hemiparesis and muscle atrophy  - SOFT TISSUE UPPER EXTREMITY LIMITED LEFT (NON-VASCULAR); Future   Discussed labs during office visit.   Patient was given opportunity to ask questions. Patient verbalized understanding of the plan and was able to repeat key elements of the plan. All questions were answered to their satisfaction.  [place], FNP   I, [name], FNP, have reviewed all documentation for this visit. The documentation on 03/21/21 for the exam, diagnosis, procedures, and orders are all accurate and complete.   IF YOU HAVE BEEN REFERRED TO A SPECIALIST, IT MAY TAKE 1-2 WEEKS TO SCHEDULE/PROCESS THE REFERRAL. IF YOU HAVE NOT HEARD FROM US/SPECIALIST IN TWO WEEKS, PLEASE GIVE 03/23/21 A CALL AT [phone] X [phone].   THE PATIENT IS ENCOURAGED TO PRACTICE SOCIAL DISTANCING DUE TO THE COVID-19 PANDEMIC.

## 2021-03-24 ENCOUNTER — Ambulatory Visit: Payer: Medicare PPO

## 2021-03-24 ENCOUNTER — Encounter: Payer: Medicare PPO | Admitting: Occupational Therapy

## 2021-03-28 ENCOUNTER — Ambulatory Visit
Admission: RE | Admit: 2021-03-28 | Discharge: 2021-03-28 | Disposition: A | Discharge status: Home / Self Care | Payer: Medicare PPO | Source: Ambulatory Visit | Attending: Nurse Practitioner | Admitting: Nurse Practitioner

## 2021-03-28 DIAGNOSIS — R2232 Localized swelling, mass and lump, left upper limb: Secondary | ICD-10-CM | POA: Diagnosis not present

## 2021-03-29 ENCOUNTER — Other Ambulatory Visit: Payer: Self-pay | Admitting: Nurse Practitioner

## 2021-03-29 DIAGNOSIS — R2232 Localized swelling, mass and lump, left upper limb: Secondary | ICD-10-CM

## 2021-03-31 ENCOUNTER — Ambulatory Visit: Payer: Medicare PPO

## 2021-03-31 ENCOUNTER — Other Ambulatory Visit: Payer: Self-pay

## 2021-03-31 ENCOUNTER — Ambulatory Visit: Payer: Medicare PPO | Admitting: Occupational Therapy

## 2021-03-31 DIAGNOSIS — R2681 Unsteadiness on feet: Secondary | ICD-10-CM | POA: Diagnosis not present

## 2021-03-31 DIAGNOSIS — I69351 Hemiplegia and hemiparesis following cerebral infarction affecting right dominant side: Secondary | ICD-10-CM | POA: Diagnosis not present

## 2021-03-31 DIAGNOSIS — R2689 Other abnormalities of gait and mobility: Secondary | ICD-10-CM

## 2021-03-31 DIAGNOSIS — M25632 Stiffness of left wrist, not elsewhere classified: Secondary | ICD-10-CM | POA: Diagnosis not present

## 2021-03-31 DIAGNOSIS — R278 Other lack of coordination: Secondary | ICD-10-CM

## 2021-03-31 DIAGNOSIS — M25621 Stiffness of right elbow, not elsewhere classified: Secondary | ICD-10-CM

## 2021-03-31 DIAGNOSIS — M6281 Muscle weakness (generalized): Secondary | ICD-10-CM

## 2021-03-31 DIAGNOSIS — M25611 Stiffness of right shoulder, not elsewhere classified: Secondary | ICD-10-CM | POA: Diagnosis not present

## 2021-03-31 NOTE — Therapy (Signed)
[facility] [address] [place], [place], [address] Phone: [phone]   Fax:  [phone]  Occupational Therapy Treatment  Patient Details  Name: [name] MRN: [id number] Date of Birth: 08/12/1935 Referring Provider (OT): Claudette Laws, MD   Encounter Date: 03/31/2021   OT End of Session - 03/31/21 1252     Visit Number 9    Number of Visits 14    Date for OT Re-Evaluation 04/21/21    Authorization Type [employer]    Authorization Time Period $20 copay- Auth Req'd    OT Start Time 1020    OT Stop Time 1100    OT Time Calculation (min) 40 min             Past Medical History:  Diagnosis Date   Arthritis    Bundle branch block    Hypercholesteremia    Hyperlipidemia 2001   Hypertension 2001   Hypertension    Peripheral vascular disease (HCC)    Stroke (HCC)    Stroke (HCC)    2001 - R side deficits    Past Surgical History:  Procedure Laterality Date   ABDOMINAL AORTOGRAM N/A 11/18/2019   Procedure: ABDOMINAL AORTOGRAM;  Surgeon: Corky Crafts, MD;  Location: [facility] INVASIVE CV LAB;  Service: Cardiovascular;  Laterality: N/A;   CORONARY ARTERY BYPASS GRAFT N/A 11/20/2019   Procedure: CORONARY ARTERY BYPASS GRAFTING (CABG), ON PUMP, TIMES THREE, USING LEFT INTERNAL MAMMARY ARTERY AND ENDOSCOPICALLY HARVESTED RIGHT GREATER SAPHENOUS VEIN;  Surgeon: Linden Dolin, MD;  Location: [facility] OR;  Service: Open Heart Surgery;  Laterality: N/A;   LEFT HEART CATH AND CORONARY ANGIOGRAPHY N/A 11/18/2019   Procedure: LEFT HEART CATH AND CORONARY ANGIOGRAPHY;  Surgeon: Corky Crafts, MD;  Location: [facility] INVASIVE CV LAB;  Service: Cardiovascular;  Laterality: N/A;   TEE WITHOUT CARDIOVERSION N/A 11/20/2019   Procedure: TRANSESOPHAGEAL ECHOCARDIOGRAM (TEE);  Surgeon: Linden Dolin, MD;  Location: [facility] OR;  Service: Open Heart Surgery;  Laterality: N/A;    There were no vitals filed for this  visit.   Subjective Assessment - 03/31/21 1252     Subjective  Ok    Limitations Hard of Hearing. Reads Lips. No Driving. Fall Risk    Currently in Pain? No/denies                Treatment: Self ROM elbow flexion and extension , followed by P/ROM to forearm and wrist. Standing at mat using bilateral UE's to roll physioball forwards and backwards then side to side, mod facilitation Seated weightbearing through right elbow, mod facilitation v.c Elbows on tray table rolling tray table forwards and backs wards and side to side for gentle AA/ROM, min facilitation. Functional gasp / release of wooden dowel pegs to place and remove from pegboard, min-mod facilitation/ v.c Picking up blocks with RUE min facilitation to place in container, min facilitation/ v.c                    OT Short Term Goals - 02/24/21 0954       OT SHORT TERM GOAL #1   Title Pt and/or caregiver will be independent with HEP for stretching for RUE    Time 4    Period Weeks    Status Achieved    Target Date 02/10/21      OT SHORT TERM GOAL #2   Title Pt will demonstrate ability to don cpap mask with mod I    Time  4    Period Weeks    Status Achieved      OT SHORT TERM GOAL #3   Title Pt will demonstrate ability to don and doff any splints PRN    Time 4    Period Weeks    Status Achieved      OT SHORT TERM GOAL #4   Title Pt will demonstrate improved passive elbow extension of -100 degrees or greater.    Baseline -105*    Time 4    Period Weeks    Status New               OT Long Term Goals - 02/24/21 1237       OT LONG TERM GOAL #1   Title Pt and caregiver will be independent with wear and care of splints PRN    Time 8    Period Weeks    Status On-going      OT LONG TERM GOAL #2   Title Pt will increase passive range of motion of elbow extension to -95 degrees or greater    Baseline -105*    Time 8    Period Weeks    Status New      OT LONG TERM GOAL #3    Title Pt will increase functional use of RUE by performing box and blocks with score of 6 or greater with RUE.    Baseline 3 blocks with R    Time 8    Period Weeks    Status New      OT LONG TERM GOAL #4   Title Pt will improve passive shoulder flexion to 60* or greater in order in RUE    Baseline 40*    Time 8    Period Weeks    Status On-going      OT LONG TERM GOAL #5   Title Pt will report using RUE for stabilization and assisting with functional tasks at least 50% of the day.    Time 8    Period Weeks    Status On-going                   Plan - 03/31/21 1253     Clinical Impression Statement Pt is progressing slowly due to spasticity.    OT Occupational Profile and History Problem Focused Assessment - Including review of records relating to presenting problem    Occupational performance deficits (Please refer to evaluation for details): IADL's;ADL's;Leisure    Body Structure / Function / Physical Skills Tone;UE functional use;Flexibility;Mobility;Coordination;Strength;ROM;ADL;IADL;Dexterity;Decreased knowledge of use of DME    Rehab Potential Fair   late effects of CVA   Clinical Decision Making Limited treatment options, no task modification necessary    Comorbidities Affecting Occupational Performance: None    Modification or Assistance to Complete Evaluation  No modification of tasks or assist necessary to complete eval    OT Frequency 1x / week    OT Duration 8 weeks    OT Treatment/Interventions Self-care/ADL training;Fluidtherapy;Moist Heat;DME and/or AE instruction;Splinting;Patient/family education;Therapeutic activities;Therapeutic exercise;Neuromuscular education;Passive range of motion;Paraffin;Manual Therapy    Plan stretching RUE s/p botox, review HEP    Consulted and Agree with Plan of Care Patient             Patient will benefit from skilled therapeutic intervention in order to improve the following deficits and impairments:   Body Structure  / Function / Physical Skills: Tone, UE functional use, Flexibility, Mobility, Coordination, Strength, ROM, ADL, IADL, Dexterity,  Decreased knowledge of use of DME       Visit Diagnosis: Spastic hemiplegia of right dominant side as late effect of cerebral infarction (HCC)  Stiffness of right elbow, not elsewhere classified  Stiffness of left wrist, not elsewhere classified  Stiffness of right shoulder, not elsewhere classified  Other lack of coordination  Muscle weakness (generalized)    Problem List Patient Active Problem List   Diagnosis Date Noted   Atherosclerotic heart disease of native coronary artery without angina pectoris 08/01/2020   Cerebrovascular accident (HCC) 08/01/2020   Constipation 08/01/2020   Mixed hyperlipidemia 08/01/2020   Personal history of colonic polyps 08/01/2020   Protein-calorie malnutrition, severe 12/02/2019   Paroxysmal atrial fibrillation (HCC)    Hx of CABG 11/20/2019   Pressure injury of skin 11/18/2019   NSTEMI (non-ST elevated myocardial infarction) (HCC) 11/17/2019   Elevated troponin 11/03/2019   PVD (peripheral vascular disease) (HCC) 11/03/2019   History of CVA with residual deficit 11/03/2019   Elevated CK 11/03/2019   Fall at home, initial encounter 11/03/2019   Prediabetes 11/03/2019   Essential hypertension 11/03/2019   PAD (peripheral artery disease) (HCC) 05/11/2019    Nashanti Duquette 03/31/2021, 12:55 PM  [facility] [address] [place], [place], [address] Phone: [phone]   Fax:  [phone]  Name: TAMOTSU WIEDERHOLT MRN: [id number] Date of Birth: 1936/01/16

## 2021-03-31 NOTE — Therapy (Addendum)
[facility] [address] [place], [place], [address] Phone: [phone]   Fax:  [phone]  Physical Therapy Treatment/Progress note  Patient Details  Name: [name] MRN: [id number] Date of Birth: 03-16-1936 Referring Provider (PT): Dr. Letta Pate   Encounter Date: 03/31/2021  Physical Therapy Progress Note   Dates of Reporting Period:01/13/21-03/31/21  See Note below for Objective Data and Assessment of Progress/Goals.    PT End of Session - 03/31/21 1207     Visit Number 9    Number of Visits 13    Date for PT Re-Evaluation 05/05/21    Authorization Type [employer]    Authorization Time Period 01/13/21    Progress Note Due on Visit 9    Equipment Utilized During Treatment Gait belt    Activity Tolerance Patient tolerated treatment well    Behavior During Therapy [facility] for tasks assessed/performed             Past Medical History:  Diagnosis Date   Arthritis    Bundle branch block    Hypercholesteremia    Hyperlipidemia 2001   Hypertension 2001   Hypertension    Peripheral vascular disease ([place])    Stroke ([place])    Stroke ([place])    2001 - R side deficits    Past Surgical History:  Procedure Laterality Date   ABDOMINAL AORTOGRAM N/A 11/18/2019   Procedure: ABDOMINAL AORTOGRAM;  Surgeon: Jettie Booze, MD;  Location: [place] [facility];  Service: Cardiovascular;  Laterality: N/A;   CORONARY ARTERY BYPASS GRAFT N/A 11/20/2019   Procedure: CORONARY ARTERY BYPASS GRAFTING (CABG), ON PUMP, TIMES THREE, USING LEFT INTERNAL MAMMARY ARTERY AND ENDOSCOPICALLY HARVESTED RIGHT GREATER SAPHENOUS VEIN;  Surgeon: Wonda Olds, MD;  Location: [place];  Service: Open Heart Surgery;  Laterality: N/A;   LEFT HEART CATH AND CORONARY ANGIOGRAPHY N/A 11/18/2019   Procedure: LEFT HEART CATH AND CORONARY ANGIOGRAPHY;  Surgeon: Jettie Booze, MD;  Location: [place] [facility];  Service: Cardiovascular;   Laterality: N/A;   TEE WITHOUT CARDIOVERSION N/A 11/20/2019   Procedure: TRANSESOPHAGEAL ECHOCARDIOGRAM (TEE);  Surgeon: Wonda Olds, MD;  Location: [place];  Service: Open Heart Surgery;  Laterality: N/A;    There were no vitals filed for this visit.       [facility] PT Assessment - 03/31/21 0001       Transfers   Transfers Sit to Stand    Sit to Stand 5: Supervision    Five time sit to stand comments  19.1s      Dynamic Gait Index   Level Surface Mild Impairment    Change in Gait Speed Mild Impairment    Gait with Horizontal Head Turns Mild Impairment    Gait with Vertical Head Turns Mild Impairment    Gait and Pivot Turn Mild Impairment    Step Over Obstacle Mild Impairment    Step Around Obstacles Mild Impairment    Steps Mild Impairment    Total Score 16    DGI comment: MDC=4                           [facility] Adult PT Treatment/Exercise - 03/31/21 0001       Transfers   Stand to Sit 5: Supervision      Ambulation/Gait   Ambulation/Gait Yes    Ambulation/Gait Assistance 5: Supervision;4: Min guard    Ambulation Distance (Feet) 500 Feet    Assistive device  Straight cane    Gait Pattern Step-to pattern;Step-through pattern;Decreased stride length;Decreased step length - right;Decreased hip/knee flexion - right;Right steppage;Decreased trunk rotation;Decreased arm swing - right    Ambulation Surface Level;Unlevel;Indoor;Outdoor;Paved    Pre-Gait Activities AFO R    Gait Comments little to no foot scuff noted      Knee/Hip Exercises: Aerobic   Other Aerobic Scifit L2 seat 20 x6' w/LUE                      PT Short Term Goals - 03/31/21 1216       PT SHORT TERM GOAL #1   Title Patient to become I in HEP (all STGs due 02/19/21)    Baseline TBD;03/31/21 walking program discussed    Time 5    Period Weeks    Status Achieved    Target Date 02/19/21      PT SHORT TERM GOAL #2   Title Assess DGI and set appropriate goal; 01/27/21 Goal is  15/24    Baseline 01/20/21: 9/24 with cane/AFO; 12/24 (02/17/21); 03/31/21 DGI score 16 with cane    Time 4    Period Weeks    Status Achieved    Target Date 02/19/21      PT SHORT TERM GOAL #3   Title Patient to ambulate 235ft with R AFO and S    Baseline 141ft with R AFO and SBA; Ambulation of 562ft outside with cane and  AFO    Time 4    Period Weeks    Status Achieved    Target Date 02/19/21      PT SHORT TERM GOAL #4   Title Obtain R AFO consult    Baseline TBD; 01/27/21 Spoke with [facility], AFO consult in progess    Time 4    Period Weeks    Status Achieved    Target Date 02/19/21               PT Long Term Goals - 03/31/21 1107       PT LONG TERM GOAL #1   Title Increase gait speed to 0.3 m/s with R AFO and no AD    Baseline 0.21 m/s gait speed; 0.58m/s (02/17/21); 03/31/21 Increase gait speed to 0.6 m/s with new AFO    Time 8    Period Weeks    Status Revised      PT LONG TERM GOAL #2   Title Patient able to perfom 5x STS in 20s    Baseline 5x STS in 26.6s; 16 seconds with use of 1 HHA on mat table;03/31/21 5x STS time of 19.1s    Time 8    Period Weeks    Status Achieved      PT LONG TERM GOAL #3   Title Patient to ambulate 512ft with R AFO and SBA across outside and grassy surfaces    Baseline 156ft with R AFO and SBA; 540ft ambulation with R AFO, cane and S w/o a misstep    Time 8    Period Weeks    Status Revised      PT LONG TERM GOAL #4   Title Assess progress towards DGI    Baseline TBD; 02/17/21 Baseline 12; 03/31/21 DGI score 16, MDC=4    Time 8    Period Weeks    Status Partially Met      PT LONG TERM GOAL #5   Title Patient to negotiate a full flight of stairs with proper L sequence  pattern    Baseline L rail with inconsistent LE sequencing for hemiparesis    Time 4    Period Weeks    Status New    Target Date 05/05/21                   Plan - 03/31/21 1212     Clinical Impression Statement Todays session focused on  continued gait training with R AFO(clinic's Reaction Walk-On), able to ambulate 557ft with cane and S/SBA, DGI score improved from 12 to 16, 5x STS score improved to below 20s.  Continued need of cuing for proper sequencing of LEs to provide most efficient wa to negotiate steps.  Able to advance to 4" step ups but CGA needed for stability.  Spoke to [facility] about need to obtain brace ASAP    Personal Factors and Comorbidities Age;Comorbidity 1;Time since onset of injury/illness/exacerbation    Comorbidities Old CVA    Examination-Activity Limitations Stand;Stairs;Locomotion Level    Examination-Participation Restrictions Shop    Stability/Clinical Decision Making Stable/Uncomplicated    Rehab Potential Good    PT Frequency 1x / week    PT Duration 8 weeks    PT Treatment/Interventions ADLs/Self Care Home Management;DME Instruction;Gait training;Stair training;Functional mobility training;Therapeutic activities;Therapeutic exercise;Balance training;Neuromuscular re-education;Patient/family education;Orthotic Fit/Training    PT Next Visit Plan Continue gait and stair training, f/u on AFO, outside ambulation    PT Hope and Agree with Plan of Care Patient;Family member/caregiver             Patient will benefit from skilled therapeutic intervention in order to improve the following deficits and impairments:  Abnormal gait, Decreased endurance, Impaired tone, Decreased activity tolerance, Decreased strength, Decreased balance, Decreased mobility, Difficulty walking, Increased muscle spasms  Visit Diagnosis: Spastic hemiplegia of right dominant side as late effect of cerebral infarction [facility])  Other abnormalities of gait and mobility  Muscle weakness (generalized)  Unsteadiness on feet     Problem List Patient Active Problem List   Diagnosis Date Noted   Atherosclerotic heart disease of native coronary artery without angina pectoris 08/01/2020    Cerebrovascular accident ([place]) 08/01/2020   Constipation 08/01/2020   Mixed hyperlipidemia 08/01/2020   Personal history of colonic polyps 08/01/2020   Protein-calorie malnutrition, severe 12/02/2019   Paroxysmal atrial fibrillation (H[facility])    Hx of CABG 11/20/2019   Pressure injury of skin 11/18/2019   NSTEMI (non-ST elevated myocardial infarction) ([place]) 11/17/2019   Elevated troponin 11/03/2019   PVD (peripheral vascular disease) ([place]) 11/03/2019   History of CVA with residual deficit 11/03/2019   Elevated CK 11/03/2019   Fall at home, initial encounter 11/03/2019   Prediabetes 11/03/2019   Essential hypertension 11/03/2019   PAD (peripheral artery disease) ([place]) 05/11/2019    Lanice Shirts 03/31/2021, 12:23 PM  [facility] [address] [place], [place], [address] Phone: [phone]   Fax:  [phone]  Name: [name] MRN: [id number] Date of Birth: 1936/05/23

## 2021-04-07 ENCOUNTER — Ambulatory Visit: Payer: Medicare PPO | Admitting: Physical Therapy

## 2021-04-07 ENCOUNTER — Encounter: Payer: Self-pay | Admitting: Occupational Therapy

## 2021-04-07 ENCOUNTER — Ambulatory Visit: Payer: Medicare PPO | Attending: Nurse Practitioner | Admitting: Occupational Therapy

## 2021-04-07 ENCOUNTER — Other Ambulatory Visit: Payer: Self-pay

## 2021-04-07 DIAGNOSIS — M25632 Stiffness of left wrist, not elsewhere classified: Secondary | ICD-10-CM | POA: Diagnosis not present

## 2021-04-07 DIAGNOSIS — R2681 Unsteadiness on feet: Secondary | ICD-10-CM | POA: Insufficient documentation

## 2021-04-07 DIAGNOSIS — M25621 Stiffness of right elbow, not elsewhere classified: Secondary | ICD-10-CM | POA: Diagnosis not present

## 2021-04-07 DIAGNOSIS — R2689 Other abnormalities of gait and mobility: Secondary | ICD-10-CM | POA: Insufficient documentation

## 2021-04-07 DIAGNOSIS — I69351 Hemiplegia and hemiparesis following cerebral infarction affecting right dominant side: Secondary | ICD-10-CM

## 2021-04-07 DIAGNOSIS — M25611 Stiffness of right shoulder, not elsewhere classified: Secondary | ICD-10-CM | POA: Diagnosis not present

## 2021-04-07 DIAGNOSIS — R278 Other lack of coordination: Secondary | ICD-10-CM | POA: Insufficient documentation

## 2021-04-07 DIAGNOSIS — M6281 Muscle weakness (generalized): Secondary | ICD-10-CM

## 2021-04-07 NOTE — Patient Instructions (Signed)
Access Code: [email or web address] URL: [email or web address] Date: 04/07/2021 Prepared by: Kallie Edward  Exercises Hand PROM Finger Extension - 1 x daily - 7 x weekly - 3 sets - 10 reps Seated Wrist Flexion Extension PROM - 1 x daily - 7 x weekly - 3 sets - 10 reps Hand PROM Finger Extension - 1 x daily - 7 x weekly - 3 sets - 10 reps Seated Wrist Supination Stretch - 1 x daily - 7 x weekly - 3 sets - 10 reps

## 2021-04-07 NOTE — Therapy (Signed)
[facility] [address] [place], [place], [address] Phone: [phone]   Fax:  [phone]  Occupational Therapy Treatment  Patient Details  Name: [name] MRN: [id number] Date of Birth: [birth year] Referring Provider (OT): Claudette Laws, MD   Encounter Date: 04/07/2021   OT End of Session - 04/07/21 1022     Visit Number 10    Number of Visits 14    Date for OT Re-Evaluation 04/21/21    Authorization Type [employer]    Authorization Time Period $20 copay- Auth Req'd    OT Start Time 1018    OT Stop Time 1100    OT Time Calculation (min) 42 min    Activity Tolerance Patient tolerated treatment well    Behavior During Therapy [facility] for tasks assessed/performed             Past Medical History:  Diagnosis Date   Arthritis    Bundle branch block    Hypercholesteremia    Hyperlipidemia 2001   Hypertension 2001   Hypertension    Peripheral vascular disease (H[facility])    Stroke ([facility])    Stroke ([facility])    2001 - R side deficits    Past Surgical History:  Procedure Laterality Date   ABDOMINAL AORTOGRAM N/A 11/18/2019   Procedure: ABDOMINAL AORTOGRAM;  Surgeon: Corky Crafts, MD;  Location: [facility] INVASIVE [facility];  Service: Cardiovascular;  Laterality: N/A;   CORONARY ARTERY BYPASS GRAFT N/A 11/20/2019   Procedure: CORONARY ARTERY BYPASS GRAFTING (CABG), ON PUMP, TIMES THREE, USING LEFT INTERNAL MAMMARY ARTERY AND ENDOSCOPICALLY HARVESTED RIGHT GREATER SAPHENOUS VEIN;  Surgeon: Linden Dolin, MD;  Location: [facility];  Service: Open Heart Surgery;  Laterality: N/A;   LEFT HEART CATH AND CORONARY ANGIOGRAPHY N/A 11/18/2019   Procedure: LEFT HEART CATH AND CORONARY ANGIOGRAPHY;  Surgeon: Corky Crafts, MD;  Location: [facility] INVASIVE CV LAB;  Service: Cardiovascular;  Laterality: N/A;   TEE WITHOUT CARDIOVERSION N/A 11/20/2019   Procedure: TRANSESOPHAGEAL ECHOCARDIOGRAM (TEE);  Surgeon: Linden Dolin,  MD;  Location: [facility];  Service: Open Heart Surgery;  Laterality: N/A;    There were no vitals filed for this visit.   Subjective Assessment - 04/07/21 1021     Subjective  "He said next Thursday" (for the brace)    Limitations Hard of Hearing. Reads Lips. No Driving. Fall Risk    Patient Stated Goals Pt's niece reported coming for AFO consult and for discussing RUE functional use    Currently in Pain? No/denies    Pain Score 0-No pain                          OT Treatments/Exercises (OP) - 04/07/21 1029       Neurological Re-education Exercises   Other Exercises 1 self PROM exercises at tabletop for RUE for wrist extension, hand and supination. Pt demonstrated understanding with min cues.    Other Grasp and Release Exercises  pt limited by severe spasticity and contracture of elbow/bicep for reach. grasp and releaes of 1 inch blocks with good coodination and ability to maintain grasp and sustained grasp for placing in bowl with RUE using lateral pinch                    OT Education - 04/07/21 1039     Education Details PROM of RUE hand and forearm Access Code: [email or web address]    Person(s) Educated Patient  Methods Explanation;Demonstration;Handout    Comprehension Verbalized understanding;Returned demonstration              OT Short Term Goals - 02/24/21 0954       OT SHORT TERM GOAL #1   Title Pt and/or caregiver will be independent with HEP for stretching for RUE    Time 4    Period Weeks    Status Achieved    Target Date 02/10/21      OT SHORT TERM GOAL #2   Title Pt will demonstrate ability to don cpap mask with mod I    Time 4    Period Weeks    Status Achieved      OT SHORT TERM GOAL #3   Title Pt will demonstrate ability to don and doff any splints PRN    Time 4    Period Weeks    Status Achieved      OT SHORT TERM GOAL #4   Title Pt will demonstrate improved passive elbow extension of -100 degrees or greater.    Baseline -105*     Time 4    Period Weeks    Status New               OT Long Term Goals - 02/24/21 1237       OT LONG TERM GOAL #1   Title Pt and caregiver will be independent with wear and care of splints PRN    Time 8    Period Weeks    Status On-going      OT LONG TERM GOAL #2   Title Pt will increase passive range of motion of elbow extension to -95 degrees or greater    Baseline -105*    Time 8    Period Weeks    Status New      OT LONG TERM GOAL #3   Title Pt will increase functional use of RUE by performing box and blocks with score of 6 or greater with RUE.    Baseline 3 blocks with R    Time 8    Period Weeks    Status New      OT LONG TERM GOAL #4   Title Pt will improve passive shoulder flexion to 60* or greater in order in RUE    Baseline 40*    Time 8    Period Weeks    Status On-going      OT LONG TERM GOAL #5   Title Pt will report using RUE for stabilization and assisting with functional tasks at least 50% of the day.    Time 8    Period Weeks    Status On-going                   Plan - 04/07/21 1050     Clinical Impression Statement Slow and steady progress d/t spasticity. Pt with good response to self stretching today and improved grasp/release.    OT Occupational Profile and History Problem Focused Assessment - Including review of records relating to presenting problem    Occupational performance deficits (Please refer to evaluation for details): IADL's;ADL's;Leisure    Body Structure / Function / Physical Skills Tone;UE functional use;Flexibility;Mobility;Coordination;Strength;ROM;ADL;IADL;Dexterity;Decreased knowledge of use of DME    Rehab Potential Fair   late effects of CVA   Clinical Decision Making Limited treatment options, no task modification necessary    Comorbidities Affecting Occupational Performance: None    Modification or Assistance to Complete Evaluation  No  modification of tasks or assist necessary to complete eval    OT  Frequency 1x / week    OT Duration 8 weeks    OT Treatment/Interventions Self-care/ADL training;Fluidtherapy;Moist Heat;DME and/or AE instruction;Splinting;Patient/family education;Therapeutic activities;Therapeutic exercise;Neuromuscular education;Passive range of motion;Paraffin;Manual Therapy    Plan plan to discharge next session, review self PROM stretching    Consulted and Agree with Plan of Care Patient             Patient will benefit from skilled therapeutic intervention in order to improve the following deficits and impairments:   Body Structure / Function / Physical Skills: Tone, UE functional use, Flexibility, Mobility, Coordination, Strength, ROM, ADL, IADL, Dexterity, Decreased knowledge of use of DME       Visit Diagnosis: Spastic hemiplegia of right dominant side as late effect of cerebral infarction [facility])  Other abnormalities of gait and mobility  Muscle weakness (generalized)  Unsteadiness on feet  Stiffness of left wrist, not elsewhere classified  Stiffness of right shoulder, not elsewhere classified  Stiffness of right elbow, not elsewhere classified  Other lack of coordination  Hemiplegia and hemiparesis following cerebral infarction affecting right dominant side [facility])    Problem List Patient Active Problem List   Diagnosis Date Noted   Atherosclerotic heart disease of native coronary artery without angina pectoris 08/01/2020   Cerebrovascular accident (HCC) 08/01/2020   Constipation 08/01/2020   Mixed hyperlipidemia 08/01/2020   Personal history of colonic polyps 08/01/2020   Protein-calorie malnutrition, severe 12/02/2019   Paroxysmal atrial fibrillation (HCC)    Hx of CABG 11/20/2019   Pressure injury of skin 11/18/2019   NSTEMI (non-ST elevated myocardial infarction) (HCC) 11/17/2019   Elevated troponin 11/03/2019   PVD (peripheral vascular disease) (HCC) 11/03/2019   History of CVA with residual deficit 11/03/2019   Elevated CK  11/03/2019   Fall at home, initial encounter 11/03/2019   Prediabetes 11/03/2019   Essential hypertension 11/03/2019   PAD (peripheral artery disease) (HCC) 05/11/2019    Junious Dresser MOT, OTR/L  04/07/2021, 11:02 AM  [facility] [address] [place], [place], [address] Phone: [phone]   Fax:  [phone]  Name: CHOYA TORNOW MRN: [id number] Date of Birth: 1936-06-30

## 2021-04-07 NOTE — Addendum Note (Signed)
Addended by: Hildred Laser on: 04/07/2021 08:41 AM   Modules accepted: Orders

## 2021-04-10 NOTE — Therapy (Signed)
[facility] [address] [place], [place], [address] Phone: [phone]   Fax:  [phone]  Physical Therapy Treatment  Patient Details  Name: [name] MRN: [id number] Date of Birth: [birth year] Referring Provider (PT): Dr. Letta Pate   Encounter Date: 04/07/2021    04/07/21 1104  PT Visits / Re-Eval  Visit Number 10  Number of Visits 13  Date for PT Re-Evaluation 05/05/21  Authorization  Authorization Type [employer]  Authorization Time Period 4 visits from 03/23/21-05/05/21  Authorization - Visit Number 2  Authorization - Number of Visits 4  Progress Note Due on Visit 9  PT Time Calculation  PT Start Time 1102  PT Stop Time 6063  PT Time Calculation (min) 43 min  PT - End of Session  Equipment Utilized During Treatment Gait belt  Activity Tolerance Patient tolerated treatment well  Behavior During Therapy [facility] for tasks assessed/performed    Past Medical History:  Diagnosis Date   Arthritis    Bundle branch block    Hypercholesteremia    Hyperlipidemia 2001   Hypertension 2001   Hypertension    Peripheral vascular disease ([place])    Stroke ([place])    Stroke ([place])    2001 - R side deficits    Past Surgical History:  Procedure Laterality Date   ABDOMINAL AORTOGRAM N/A 11/18/2019   Procedure: ABDOMINAL AORTOGRAM;  Surgeon: Jettie Booze, MD;  Location: [place] [facility];  Service: Cardiovascular;  Laterality: N/A;   CORONARY ARTERY BYPASS GRAFT N/A 11/20/2019   Procedure: CORONARY ARTERY BYPASS GRAFTING (CABG), ON PUMP, TIMES THREE, USING LEFT INTERNAL MAMMARY ARTERY AND ENDOSCOPICALLY HARVESTED RIGHT GREATER SAPHENOUS VEIN;  Surgeon: Wonda Olds, MD;  Location: [facility];  Service: Open Heart Surgery;  Laterality: N/A;   LEFT HEART CATH AND CORONARY ANGIOGRAPHY N/A 11/18/2019   Procedure: LEFT HEART CATH AND CORONARY ANGIOGRAPHY;  Surgeon: Jettie Booze, MD;  Location: [place] [facility];   Service: Cardiovascular;  Laterality: N/A;   TEE WITHOUT CARDIOVERSION N/A 11/20/2019   Procedure: TRANSESOPHAGEAL ECHOCARDIOGRAM (TEE);  Surgeon: Wonda Olds, MD;  Location: [facility];  Service: Open Heart Surgery;  Laterality: N/A;    There were no vitals filed for this visit.     04/07/21 1100  Symptoms/Limitations  Subjective No new complaints. No falls or pain to report.  Pertinent History 85 y/o male , lives home alone (niece next door), presented s/p fall at home (3rd this year). PMHx includes: arthritis, R bundle branch block and bradycardia, HTN, PVD, CVA w/ R side weakness. In ED had contusion of B shoulders and R hip, signs of aortic and corinary artherosclerotic disease. Pt admitted with elevated troponin.  Limitations Sitting  How long can you sit comfortably? unlimited  How long can you stand comfortably? <15 min  How long can you walk comfortably? <15 min  Patient Stated Goals To be able to function better and obtain an new AFO  Pain Assessment  Currently in Pain? No/denies  Pain Score 0      04/07/21 1113  Transfers  Transfers Sit to Stand  Sit to Stand 5: Supervision  Stand to Sit 5: Supervision  Ambulation/Gait  Ambulation/Gait Yes  Ambulation/Gait Assistance 5: Supervision  Ambulation/Gait Assistance Details pt intially with step to gait pattern progressing toward step through with decreased stride length. no toe scuffing noted with any gait.  Ambulation Distance (Feet) 350 Feet (x1)  Assistive device Straight cane;Other (Comment)  Gait Pattern Step-to pattern;Step-through pattern;Decreased  stride length;Decreased step length - right;Decreased hip/knee flexion - right;Right steppage;Decreased trunk rotation;Decreased arm swing - right  Ambulation Surface Level;Indoor  Gait Comments continue with use of anterior [place] walk on brace with session  High Level Balance  High Level Balance Activities Negotitating around obstacles;Negotiating over obstacles   High Level Balance Comments with cane/brace on right LE: hoola hoops on floor <> 3 bolsters on floor- walking around the hoola hoops and forward stepping over the bolsters for 6 laps with min guard assist, cues on step lenghth with walking around the hoola hoops and on sequencing with stepping over the bolsters.      04/07/21 1127  Balance Exercises: Standing  Standing Eyes Closed Foam/compliant surface;Wide (BOA);Head turns;Other reps (comment);30 secs;Limitations  Standing Eyes Closed Limitations on airex with feet hip width apart, intermittent single UE support on sturdy surface: EC no head movements 30 sec's x 3 reps, progressing to EC head movements left<>right, up<>down for ~10- reps each. Min guard to min assist for balance.  Sit to Stand Standard surface;Foam/compliant surface;Limitations  Sit to Stand Limitations seated with feet on airex: sit<>stand with light UE support, cues for full standing and slow, controlled descent with sitting down. min guard to min assist for balance.        PT Short Term Goals - 03/31/21 1216       PT SHORT TERM GOAL #1   Title Patient to become I in HEP (all STGs due 02/19/21)    Baseline TBD;03/31/21 walking program discussed    Time 5    Period Weeks    Status Achieved    Target Date 02/19/21      PT SHORT TERM GOAL #2   Title Assess DGI and set appropriate goal; 01/27/21 Goal is 15/24    Baseline 01/20/21: 9/24 with cane/AFO; 12/24 (02/17/21); 03/31/21 DGI score 16 with cane    Time 4    Period Weeks    Status Achieved    Target Date 02/19/21      PT SHORT TERM GOAL #3   Title Patient to ambulate 243ft with R AFO and S    Baseline 134ft with R AFO and SBA; Ambulation of 539ft outside with cane and  AFO    Time 4    Period Weeks    Status Achieved    Target Date 02/19/21      PT SHORT TERM GOAL #4   Title Obtain R AFO consult    Baseline TBD; 01/27/21 Spoke with [facility], AFO consult in progess    Time 4    Period Weeks    Status  Achieved    Target Date 02/19/21               PT Long Term Goals - 03/31/21 1107       PT LONG TERM GOAL #1   Title Increase gait speed to 0.3 m/s with R AFO and no AD    Baseline 0.21 m/s gait speed; 0.38m/s (02/17/21); 03/31/21 Increase gait speed to 0.6 m/s with new AFO    Time 8    Period Weeks    Status Revised      PT LONG TERM GOAL #2   Title Patient able to perfom 5x STS in 20s    Baseline 5x STS in 26.6s; 16 seconds with use of 1 HHA on mat table;03/31/21 5x STS time of 19.1s    Time 8    Period Weeks    Status Achieved  PT LONG TERM GOAL #3   Title Patient to ambulate 549ft with R AFO and SBA across outside and grassy surfaces    Baseline 116ft with R AFO and SBA; 520ft ambulation with R AFO, cane and S w/o a misstep    Time 8    Period Weeks    Status Revised      PT LONG TERM GOAL #4   Title Assess progress towards DGI    Baseline TBD; 02/17/21 Baseline 12; 03/31/21 DGI score 16, MDC=4    Time 8    Period Weeks    Status Partially Met      PT LONG TERM GOAL #5   Title Patient to negotiate a full flight of stairs with proper L sequence pattern    Baseline L rail with inconsistent LE sequencing for hemiparesis    Time 4    Period Weeks    Status New    Target Date 05/05/21              04/07/21 1105  Plan  Clinical Impression Statement Today's skilled session continued to focus on use of clinic brace with gait and balance activities with no issues noted or reported in session. No caregiver present to further discuss pt getting brace at [facility], will address this at next session pending caregiver being present.  Personal Factors and Comorbidities Age;Comorbidity 1;Time since onset of injury/illness/exacerbation  Comorbidities Old CVA  Examination-Activity Limitations Stand;Stairs;Locomotion Level  Examination-Participation Restrictions Shop  Pt will benefit from skilled therapeutic intervention in order to improve on the following deficits  Abnormal gait;Decreased endurance;Impaired tone;Decreased activity tolerance;Decreased strength;Decreased balance;Decreased mobility;Difficulty walking;Increased muscle spasms  Stability/Clinical Decision Making Stable/Uncomplicated  Rehab Potential Good  PT Frequency 1x / week  PT Duration 4 weeks  PT Treatment/Interventions ADLs/Self Care Home Management;DME Instruction;Gait training;Stair training;Functional mobility training;Therapeutic activities;Therapeutic exercise;Balance training;Neuromuscular re-education;Patient/family education;Orthotic Fit/Training  PT Next Visit Plan Pt has 2 more approved visits from [facility], ? do we need more or go on hold until pt gets his new brace- will discuss with primary PT. Continue gait and stair training, f/u on AFO, outside ambulation  PT [place] and Agree with Plan of Care Patient;Family member/caregiver          Patient will benefit from skilled therapeutic intervention in order to improve the following deficits and impairments:  Abnormal gait, Decreased endurance, Impaired tone, Decreased activity tolerance, Decreased strength, Decreased balance, Decreased mobility, Difficulty walking, Increased muscle spasms  Visit Diagnosis: Spastic hemiplegia of right dominant side as late effect of cerebral infarction [facility])  Other abnormalities of gait and mobility  Muscle weakness (generalized)  Unsteadiness on feet     Problem List Patient Active Problem List   Diagnosis Date Noted   Atherosclerotic heart disease of native coronary artery without angina pectoris 08/01/2020   Cerebrovascular accident ([place]) 08/01/2020   Constipation 08/01/2020   Mixed hyperlipidemia 08/01/2020   Personal history of colonic polyps 08/01/2020   Protein-calorie malnutrition, severe 12/02/2019   Paroxysmal atrial fibrillation ([facility])    Hx of CABG 11/20/2019   Pressure injury of skin 11/18/2019   NSTEMI (non-ST elevated myocardial  infarction) ([place]) 11/17/2019   Elevated troponin 11/03/2019   PVD (peripheral vascular disease) ([place]) 11/03/2019   History of CVA with residual deficit 11/03/2019   Elevated CK 11/03/2019   Fall at home, initial encounter 11/03/2019   Prediabetes 11/03/2019   Essential hypertension 11/03/2019   PAD (peripheral artery disease) ([place]) 05/11/2019    Willow Ora,  PTA, [facility] [address] [place], [place] [address] [phone] 04/10/21, 6:24 PM   Name: [name] MRN: [id number] Date of Birth: May 27, 1936

## 2021-04-11 DIAGNOSIS — M21371 Foot drop, right foot: Secondary | ICD-10-CM | POA: Diagnosis not present

## 2021-04-14 ENCOUNTER — Ambulatory Visit: Payer: Medicare PPO

## 2021-04-14 ENCOUNTER — Ambulatory Visit: Payer: Medicare PPO | Admitting: Occupational Therapy

## 2021-04-14 ENCOUNTER — Other Ambulatory Visit: Payer: Self-pay

## 2021-04-14 ENCOUNTER — Encounter: Payer: Self-pay | Admitting: Occupational Therapy

## 2021-04-14 DIAGNOSIS — M25632 Stiffness of left wrist, not elsewhere classified: Secondary | ICD-10-CM

## 2021-04-14 DIAGNOSIS — R2681 Unsteadiness on feet: Secondary | ICD-10-CM | POA: Diagnosis not present

## 2021-04-14 DIAGNOSIS — G4733 Obstructive sleep apnea (adult) (pediatric): Secondary | ICD-10-CM | POA: Diagnosis not present

## 2021-04-14 DIAGNOSIS — I739 Peripheral vascular disease, unspecified: Secondary | ICD-10-CM | POA: Diagnosis not present

## 2021-04-14 DIAGNOSIS — I69351 Hemiplegia and hemiparesis following cerebral infarction affecting right dominant side: Secondary | ICD-10-CM | POA: Diagnosis not present

## 2021-04-14 DIAGNOSIS — R2689 Other abnormalities of gait and mobility: Secondary | ICD-10-CM

## 2021-04-14 DIAGNOSIS — M25611 Stiffness of right shoulder, not elsewhere classified: Secondary | ICD-10-CM

## 2021-04-14 DIAGNOSIS — M6281 Muscle weakness (generalized): Secondary | ICD-10-CM

## 2021-04-14 DIAGNOSIS — M25621 Stiffness of right elbow, not elsewhere classified: Secondary | ICD-10-CM | POA: Diagnosis not present

## 2021-04-14 DIAGNOSIS — R278 Other lack of coordination: Secondary | ICD-10-CM

## 2021-04-14 NOTE — Therapy (Signed)
[facility] [address] [place], [place], [address] Phone: [phone]   Fax:  [phone]  Physical Therapy Treatment  Patient Details  Name: [name] MRN: [id number] Date of Birth: 05-May-1936 Referring Provider (PT): Dr. Wynn Banker   Encounter Date: 04/14/2021   PT End of Session - 04/14/21 1028     Visit Number 11    Number of Visits 13    Date for PT Re-Evaluation 05/05/21    Authorization Type [employer]    Authorization Time Period 4 visits from 03/23/21-05/05/21    Authorization - Visit Number 2    Authorization - Number of Visits 4    Progress Note Due on Visit 10    PT Start Time 1015    PT Stop Time 1100    PT Time Calculation (min) 45 min    Equipment Utilized During Treatment Gait belt    Activity Tolerance Patient tolerated treatment well    Behavior During Therapy [facility] for tasks assessed/performed             Past Medical History:  Diagnosis Date   Arthritis    Bundle branch block    Hypercholesteremia    Hyperlipidemia 2001   Hypertension 2001   Hypertension    Peripheral vascular disease (H[facility])    Stroke ([facility])    Stroke (H[facility])    2001 - R side deficits    Past Surgical History:  Procedure Laterality Date   ABDOMINAL AORTOGRAM N/A 11/18/2019   Procedure: ABDOMINAL AORTOGRAM;  Surgeon: Corky Crafts, MD;  Location: [facility] INV[facility];  Service: Cardiovascular;  Laterality: N/A;   CORONARY ARTERY BYPASS GRAFT N/A 11/20/2019   Procedure: CORONARY ARTERY BYPASS GRAFTING (CABG), ON PUMP, TIMES THREE, USING LEFT INTERNAL MAMMARY ARTERY AND ENDOSCOPICALLY HARVESTED RIGHT GREATER SAPHENOUS VEIN;  Surgeon: Linden Dolin, MD;  Location: [facility];  Service: Open Heart Surgery;  Laterality: N/A;   LEFT HEART CATH AND CORONARY ANGIOGRAPHY N/A 11/18/2019   Procedure: LEFT HEART CATH AND CORONARY ANGIOGRAPHY;  Surgeon: Corky Crafts, MD;  Location: [facility] INVASIVE CV LAB;  Service:  Cardiovascular;  Laterality: N/A;   TEE WITHOUT CARDIOVERSION N/A 11/20/2019   Procedure: TRANSESOPHAGEAL ECHOCARDIOGRAM (TEE);  Surgeon: Linden Dolin, MD;  Location: [facility] OR;  Service: Open Heart Surgery;  Laterality: N/A;    There were no vitals filed for this visit.   Subjective Assessment - 04/14/21 1147     Subjective Has obtained new AFO and feels it helps hime as it is lighter    Patient is accompained by: Family member    Pertinent History 85 y/o male , lives home alone (niece next door), presented s/p fall at home (3rd this year). PMHx includes: arthritis, R bundle branch block and bradycardia, HTN, PVD, CVA w/ R side weakness. In ED had contusion of B shoulders and R hip, signs of aortic and corinary artherosclerotic disease. Pt admitted with elevated troponin.    Limitations Sitting    How long can you sit comfortably? unlimited    How long can you stand comfortably? <15 min    How long can you walk comfortably? <15 min    Patient Stated Goals To be able to function better and obtain an new AFO                [facility] PT Assessment - 04/14/21 0001       Ambulation/Gait   Gait velocity 0.43 m/s    Stairs Yes  Stairs Assistance 5: Supervision    Stairs Assistance Details (indicate cue type and reason) good cane management    Stair Management Technique One rail Left;Step to pattern    Number of Stairs 16    Height of Stairs 6    Door Management 6: Modified independent (Device/Increase time)    Ramp 5: Supervision    Curb Not tested (comment)   does not have sufficient LLE strength and requires LUE support   Gait Comments has nw AFO and manages cane well with stairs.                           [facility] Adult PT Treatment/Exercise - 04/14/21 0001       Transfers   Transfers Sit to Stand    Sit to Stand 4: Min guard    Stand to Sit 4: Min guard    Comments perfromed seated on airex 10x      Ambulation/Gait   Ambulation/Gait Yes     Ambulation/Gait Assistance 5: Supervision    Ambulation Distance (Feet) 200 Feet    Assistive device Straight cane;Other (Comment)    Gait Pattern Step-to pattern;Step-through pattern;Decreased stride length;Decreased step length - right;Decreased hip/knee flexion - right;Right steppage;Decreased trunk rotation;Decreased arm swing - right    Ambulation Surface Level;Unlevel;Outdoor;Grass      Lumbar Exercises: Seated   Other Seated Lumbar Exercises FWD reaching to retrieve 3.3# ball from floor, 5x midline, 5x R of midline, 5x L or midline                       PT Short Term Goals - 03/31/21 1216       PT SHORT TERM GOAL #1   Title Patient to become I in HEP (all STGs due 02/19/21)    Baseline TBD;03/31/21 walking program discussed    Time 5    Period Weeks    Status Achieved    Target Date 02/19/21      PT SHORT TERM GOAL #2   Title Assess DGI and set appropriate goal; 01/27/21 Goal is 15/24    Baseline 01/20/21: 9/24 with cane/AFO; 12/24 (02/17/21); 03/31/21 DGI score 16 with cane    Time 4    Period Weeks    Status Achieved    Target Date 02/19/21      PT SHORT TERM GOAL #3   Title Patient to ambulate 243ft with R AFO and S    Baseline 160ft with R AFO and SBA; Ambulation of 534ft outside with cane and  AFO    Time 4    Period Weeks    Status Achieved    Target Date 02/19/21      PT SHORT TERM GOAL #4   Title Obtain R AFO consult    Baseline TBD; 01/27/21 Spoke with [facility], AFO consult in progess    Time 4    Period Weeks    Status Achieved    Target Date 02/19/21               PT Long Term Goals - 03/31/21 1107       PT LONG TERM GOAL #1   Title Increase gait speed to 0.3 m/s with R AFO and no AD    Baseline 0.21 m/s gait speed; 0.7m/s (02/17/21); 03/31/21 Increase gait speed to 0.6 m/s with new AFO    Time 8    Period Weeks    Status Revised  PT LONG TERM GOAL #2   Title Patient able to perfom 5x STS in 20s    Baseline 5x STS in  26.6s; 16 seconds with use of 1 HHA on mat table;03/31/21 5x STS time of 19.1s    Time 8    Period Weeks    Status Achieved      PT LONG TERM GOAL #3   Title Patient to ambulate 562ft with R AFO and SBA across outside and grassy surfaces    Baseline 149ft with R AFO and SBA; 5103ft ambulation with R AFO, cane and S w/o a misstep    Time 8    Period Weeks    Status Revised      PT LONG TERM GOAL #4   Title Assess progress towards DGI    Baseline TBD; 02/17/21 Baseline 12; 03/31/21 DGI score 16, MDC=4    Time 8    Period Weeks    Status Partially Met      PT LONG TERM GOAL #5   Title Patient to negotiate a full flight of stairs with proper L sequence pattern    Baseline L rail with inconsistent LE sequencing for hemiparesis    Time 4    Period Weeks    Status New    Target Date 05/05/21                   Plan - 04/14/21 1058     Clinical Impression Statement todays session assessed patients ability to mobilize with new AFO, activities included negotiation of a full flight of steps, ambulation across grassy surfaces, negotiation of a ramp and assement of gait velocity.  No foot scuff noted today and able to negotiate stairs with proper pattern.    Personal Factors and Comorbidities Age;Comorbidity 1;Time since onset of injury/illness/exacerbation    Comorbidities Old CVA    Examination-Activity Limitations Stand;Stairs;Locomotion Level    Examination-Participation Restrictions Shop    Stability/Clinical Decision Making Stable/Uncomplicated    Rehab Potential Good    PT Frequency 1x / week    PT Duration 4 weeks    PT Treatment/Interventions ADLs/Self Care Home Management;DME Instruction;Gait training;Stair training;Functional mobility training;Therapeutic activities;Therapeutic exercise;Balance training;Neuromuscular re-education;Patient/family education;Orthotic Fit/Training    PT Next Visit Plan Pt has 2 more approved visits from [facility], ? do we need more or go on hold  until pt gets his new brace- will discuss with primary PT. Continue gait and stair training, f/u on AFO, outside ambulation    PT [place] and Agree with Plan of Care Patient;Family member/caregiver             Patient will benefit from skilled therapeutic intervention in order to improve the following deficits and impairments:  Abnormal gait, Decreased endurance, Impaired tone, Decreased activity tolerance, Decreased strength, Decreased balance, Decreased mobility, Difficulty walking, Increased muscle spasms  Visit Diagnosis: Spastic hemiplegia of right dominant side as late effect of cerebral infarction [facility])  Other abnormalities of gait and mobility  Unsteadiness on feet  Hemiplegia and hemiparesis following cerebral infarction affecting right dominant side [facility])     Problem List Patient Active Problem List   Diagnosis Date Noted   Atherosclerotic heart disease of native coronary artery without angina pectoris 08/01/2020   Cerebrovascular accident [facility]) 08/01/2020   Constipation 08/01/2020   Mixed hyperlipidemia 08/01/2020   Personal history of colonic polyps 08/01/2020   Protein-calorie malnutrition, severe 12/02/2019   Paroxysmal atrial fibrillation (HCC)    Hx of  CABG 11/20/2019   Pressure injury of skin 11/18/2019   NSTEMI (non-ST elevated myocardial infarction) ([place]) 11/17/2019   Elevated troponin 11/03/2019   PVD (peripheral vascular disease) ([place]) 11/03/2019   History of CVA with residual deficit 11/03/2019   Elevated CK 11/03/2019   Fall at home, initial encounter 11/03/2019   Prediabetes 11/03/2019   Essential hypertension 11/03/2019   PAD (peripheral artery disease) ([place]) 05/11/2019    Lanice Shirts, PT 04/14/2021, 12:03 PM  [facility] [address] [place], [place], [address] Phone: [phone]   Fax:  [phone]  Name: [name] MRN:  [id number] Date of Birth: 1935-10-24

## 2021-04-14 NOTE — Therapy (Signed)
[facility] [address] [place], [place], [address] Phone: [phone]   Fax:  [phone]  Occupational Therapy Treatment & Discharge  Patient Details  Name: [name] MRN: [id number] Date of Birth: 12/04/1935 Referring Provider (OT): Andrew Kirsteins, MD   Encounter Date: 04/14/2021   OT End of Session - 04/14/21 1101     Visit Number 11    Number of Visits 14    Date for OT Re-Evaluation 04/21/21    Authorization Type [employer]    Authorization Time Period $20 copay- Auth Req'd    OT Start Time 1101    OT Stop Time 1145    OT Time Calculation (min) 44 min    Activity Tolerance Patient tolerated treatment well    Behavior During Therapy WFL for tasks assessed/performed             Past Medical History:  Diagnosis Date   Arthritis    Bundle branch block    Hypercholesteremia    Hyperlipidemia 2001   Hypertension 2001   Hypertension    Peripheral vascular disease (HCC)    Stroke (HCC)    Stroke (HCC)    2001 - R side deficits    Past Surgical History:  Procedure Laterality Date   ABDOMINAL AORTOGRAM N/A 11/18/2019   Procedure: ABDOMINAL AORTOGRAM;  Surgeon: Varanasi, Jayadeep S, MD;  Location: [facility] INVASIVE CV LAB;  Service: Cardiovascular;  Laterality: N/A;   CORONARY ARTERY BYPASS GRAFT N/A 11/20/2019   Procedure: CORONARY ARTERY BYPASS GRAFTING (CABG), ON PUMP, TIMES THREE, USING LEFT INTERNAL MAMMARY ARTERY AND ENDOSCOPICALLY HARVESTED RIGHT GREATER SAPHENOUS VEIN;  Surgeon: Atkins, Broadus Z, MD;  Location: [facility] OR;  Service: Open Heart Surgery;  Laterality: N/A;   LEFT HEART CATH AND CORONARY ANGIOGRAPHY N/A 11/18/2019   Procedure: LEFT HEART CATH AND CORONARY ANGIOGRAPHY;  Surgeon: Varanasi, Jayadeep S, MD;  Location: [facility] INV[facility];  Service: Cardiovascular;  Laterality: N/A;   TEE WITHOUT CARDIOVERSION N/A 11/20/2019   Procedure: TRANSESOPHAGEAL ECHOCARDIOGRAM (TEE);  Surgeon: Atkins,  Broadus Z, MD;  Location: [facility] OR;  Service: Open Heart Surgery;  Laterality: N/A;    There were no vitals filed for this visit.   Subjective Assessment - 04/14/21 1101     Subjective  "i been stretching my hand , it do good sometimes - I think it's just lazy"    Limitations Hard of Hearing. Reads Lips. No Driving. Fall Risk    Patient Stated Goals Pt's niece reported coming for AFO consult and for discussing RUE functional use    Currently in Pain? No/denies                OCCUPATIONAL THERAPY DISCHARGE SUMMARY  Visits from Start of Care: 11  Current functional level related to goals / functional outcomes: Pt has progressed with increased ROM  in elbow extension, wrist extension and shoulder flexion however very limited. Pt is benefitting from custom resting hand splint for RUE for prolonged stretch for nighttime wear.   Remaining deficits: Pt continues to be very limited by spasticity and tone in RUE.   Education / Equipment: Custom resting hand splint   Patient agrees to discharge. Patient goals were partially met. Patient is being discharged due to maximized rehab potential. .       Manual Therapy to RUE for prolonged extended stretching for inhibition of tone and increasing ROM.                OT   Short Term Goals - 04/14/21 1103       OT SHORT TERM GOAL #1   Title Pt and/or caregiver will be independent with HEP for stretching for RUE    Time 4    Period Weeks    Status Achieved    Target Date 02/10/21      OT SHORT TERM GOAL #2   Title Pt will demonstrate ability to don cpap mask with mod I    Time 4    Period Weeks    Status Achieved      OT SHORT TERM GOAL #3   Title Pt will demonstrate ability to don and doff any splints PRN    Time 4    Period Weeks    Status Achieved      OT SHORT TERM GOAL #4   Title Pt will demonstrate improved passive elbow extension of -100 degrees or greater.    Baseline -105*    Time 4    Period Weeks     Status Achieved   -90 degrees of elbow extension              OT Long Term Goals - 04/14/21 1103       OT LONG TERM GOAL #1   Title Pt and caregiver will be independent with wear and care of splints PRN    Time 8    Period Weeks    Status Achieved      OT LONG TERM GOAL #2   Title Pt will increase passive range of motion of elbow extension to -95 degrees or greater    Baseline -105*    Time 8    Period Weeks    Status Achieved   -90 degrees of elbow extension     OT LONG TERM GOAL #3   Title Pt will increase functional use of RUE by performing box and blocks with score of 6 or greater with RUE.    Baseline 3 blocks with R    Time 8    Period Weeks    Status Not Met   could not complete 04/14/21     OT LONG TERM GOAL #4   Title Pt will improve passive shoulder flexion to 60* or greater in order in RUE    Baseline 40*    Time 8    Period Weeks    Status Achieved   60* on 04/14/21     OT LONG TERM GOAL #5   Title Pt will report using RUE for stabilization and assisting with functional tasks at least 50% of the day.    Time 8    Period Weeks    Status Unable to assess   unable to determine. no family member present at d/c                  Plan - 04/14/21 1137     Clinical Impression Statement Pt progressed slightly with goals. Pt continues to be very limited by RUE spasticity and tone. Benefitting from custom resting hand splint. Pt set to discahrge today.    OT Occupational Profile and History Problem Focused Assessment - Including review of records relating to presenting problem    Occupational performance deficits (Please refer to evaluation for details): IADL's;ADL's;Leisure    Body Structure / Function / Physical Skills Tone;UE functional use;Flexibility;Mobility;Coordination;Strength;ROM;ADL;IADL;Dexterity;Decreased knowledge of use of DME    Rehab Potential Fair   late effects of CVA   Clinical Decision Making Limited treatment options, no task  modification necessary    Comorbidities Affecting Occupational Performance: None    Modification or Assistance to Complete Evaluation  No modification of tasks or assist necessary to complete eval    OT Frequency 1x / week    OT Duration 8 weeks    OT Treatment/Interventions Self-care/ADL training;Fluidtherapy;Moist Heat;DME and/or AE instruction;Splinting;Patient/family education;Therapeutic activities;Therapeutic exercise;Neuromuscular education;Passive range of motion;Paraffin;Manual Therapy    Plan discharge OT    Consulted and Agree with Plan of Care Patient             Patient will benefit from skilled therapeutic intervention in order to improve the following deficits and impairments:   Body Structure / Function / Physical Skills: Tone, UE functional use, Flexibility, Mobility, Coordination, Strength, ROM, ADL, IADL, Dexterity, Decreased knowledge of use of DME       Visit Diagnosis: Spastic hemiplegia of right dominant side as late effect of cerebral infarction (HCC)  Other abnormalities of gait and mobility  Other lack of coordination  Hemiplegia and hemiparesis following cerebral infarction affecting right dominant side (HCC)  Muscle weakness (generalized)  Stiffness of left wrist, not elsewhere classified  Stiffness of right shoulder, not elsewhere classified  Unsteadiness on feet  Stiffness of right elbow, not elsewhere classified    Problem List Patient Active Problem List   Diagnosis Date Noted   Atherosclerotic heart disease of native coronary artery without angina pectoris 08/01/2020   Cerebrovascular accident ([place]) 08/01/2020   Constipation 08/01/2020   Mixed hyperlipidemia 08/01/2020   Personal history of colonic polyps 08/01/2020   Protein-calorie malnutrition, severe 12/02/2019   Paroxysmal atrial fibrillation (HCC)    Hx of CABG 11/20/2019   Pressure injury of skin 11/18/2019   NSTEMI (non-ST elevated myocardial infarction) ([place]) 11/17/2019    Elevated troponin 11/03/2019   PVD (peripheral vascular disease) ([place]) 11/03/2019   History of CVA with residual deficit 11/03/2019   Elevated CK 11/03/2019   Fall at home, initial encounter 11/03/2019   Prediabetes 11/03/2019   Essential hypertension 11/03/2019   PAD (peripheral artery disease) ([place]) 05/11/2019    Zachery Conch, OT/L 04/14/2021, 11:45 AM  [facility] [address] [place], [place], [address] Phone: [phone]   Fax:  [phone]  Name: LINDEN TAGLIAFERRO MRN: [id number] Date of Birth: 10-18-1935

## 2021-04-20 ENCOUNTER — Ambulatory Visit
Admission: RE | Admit: 2021-04-20 | Discharge: 2021-04-20 | Disposition: A | Discharge status: Home / Self Care | Payer: Medicare PPO | Source: Ambulatory Visit | Attending: Nurse Practitioner | Admitting: Nurse Practitioner

## 2021-04-20 DIAGNOSIS — R2232 Localized swelling, mass and lump, left upper limb: Secondary | ICD-10-CM

## 2021-04-20 DIAGNOSIS — M75122 Complete rotator cuff tear or rupture of left shoulder, not specified as traumatic: Secondary | ICD-10-CM | POA: Diagnosis not present

## 2021-04-20 MED ORDER — GADOBENATE DIMEGLUMINE 529 MG/ML IV SOLN
20.0000 mL | Freq: Once | INTRAVENOUS | Status: AC | PRN
  Administered 2021-04-20: 20 mL via INTRAVENOUS

## 2021-04-25 ENCOUNTER — Other Ambulatory Visit: Payer: Self-pay | Admitting: Nurse Practitioner

## 2021-04-25 DIAGNOSIS — S46812A Strain of other muscles, fascia and tendons at shoulder and upper arm level, left arm, initial encounter: Secondary | ICD-10-CM | POA: Insufficient documentation

## 2021-04-25 DIAGNOSIS — M75102 Unspecified rotator cuff tear or rupture of left shoulder, not specified as traumatic: Secondary | ICD-10-CM | POA: Insufficient documentation

## 2021-04-29 NOTE — Progress Notes (Deleted)
Cardiology Office Note:    Date:  04/29/2021   ID:  [name], DOB 05-Jun-1936, MRN [id number]  PCP:  [name], FNP  Cardiologist:  Lesleigh Noe, MD   Referring MD: [name], FNP   No chief complaint on file.   History of Present Illness:    [name] is a 85 y.o. male with a hx of systemic hypertension, hyperlipidemia, CVA, PAD ([place]), Prediabetes, abnormal EKG (right bundle, prominent voltage, and sinus bradycardia), mechanical fall 10/2019, NSTEMI during admission for fall, cath revealed 3 V CAD, --> CABG x 3 (LIMA-LAD, SVG-PDA, SVG-Diag) and Atrial ligation, and post Op atrial fibrillation treated with amiodarone..  ***  Past Medical History:  Diagnosis Date   Arthritis    Bundle branch block    Hypercholesteremia    Hyperlipidemia 2001   Hypertension 2001   Hypertension    Peripheral vascular disease (HCC)    Stroke [facility])    Stroke (HCC)    2001 - R side deficits    Past Surgical History:  Procedure Laterality Date   ABDOMINAL AORTOGRAM N/A 11/18/2019   Procedure: ABDOMINAL AORTOGRAM;  Surgeon: Corky Crafts, MD;  Location: [facility] INVASIVE CV LAB;  Service: Cardiovascular;  Laterality: N/A;   CORONARY ARTERY BYPASS GRAFT N/A 11/20/2019   Procedure: CORONARY ARTERY BYPASS GRAFTING (CABG), ON PUMP, TIMES THREE, USING LEFT INTERNAL MAMMARY ARTERY AND ENDOSCOPICALLY HARVESTED RIGHT GREATER SAPHENOUS VEIN;  Surgeon: Linden Dolin, MD;  Location: [facility] OR;  Service: Open Heart Surgery;  Laterality: N/A;   LEFT HEART CATH AND CORONARY ANGIOGRAPHY N/A 11/18/2019   Procedure: LEFT HEART CATH AND CORONARY ANGIOGRAPHY;  Surgeon: Corky Crafts, MD;  Location: [facility] INVASIVE CV LAB;  Service: Cardiovascular;  Laterality: N/A;   TEE WITHOUT CARDIOVERSION N/A 11/20/2019   Procedure: TRANSESOPHAGEAL ECHOCARDIOGRAM (TEE);  Surgeon: Linden Dolin, MD;  Location: [facility] OR;  Service: Open Heart Surgery;  Laterality: N/A;    Current Medications: No  outpatient medications have been marked as taking for the 05/01/21 encounter (Appointment) with Lyn Records, MD.     Allergies:   Patient has no known allergies.   Social History   Socioeconomic History   Marital status: Single    Spouse name: Not on file   Number of children: Not on file   Years of education: Not on file   Highest education level: Not on file  Occupational History   Occupation: retired  Tobacco Use   Smoking status: Never   Smokeless tobacco: Never  Vaping Use   Vaping Use: Not on file  Substance and Sexual Activity   Alcohol use: Never   Drug use: Never   Sexual activity: Not Currently  Other Topics Concern   Not on file  Social History Narrative   ** Merged History Encounter **       Social Determinants of Health   Financial Resource Strain: Low Risk    Difficulty of Paying Living Expenses: Not hard at all  Food Insecurity: No Food Insecurity   Worried About Programme researcher, broadcasting/film/video in the Last Year: Never true   Ran Out of Food in the Last Year: Never true  Transportation Needs: No Transportation Needs   Lack of Transportation (Medical): No   Lack of Transportation (Non-Medical): No  Physical Activity: Inactive   Days of Exercise per Week: 0 days   Minutes of Exercise per Session: 0 min  Stress: No Stress Concern Present   Feeling of Stress : Not  at all  Social Connections: Not on file     Family History: The patient's family history includes Cancer in his brother; Heart attack in his father; Stroke in his father and mother.  ROS:   Please see the history of present illness.    *** All other systems reviewed and are negative.  EKGs/Labs/Other Studies Reviewed:    The following studies were reviewed today: ***  EKG:  EKG ***  Recent Labs: 07/21/2020: Hemoglobin 13.9; Platelets 243 02/23/2021: ALT 39; BUN 15; Creatinine, Ser 1.14; Potassium 4.6; Sodium 141  Recent Lipid Panel    Component Value Date/Time   CHOL 144 02/23/2021 1122    TRIG 83 02/23/2021 1122   HDL 51 02/23/2021 1122   CHOLHDL 2.8 02/23/2021 1122   CHOLHDL 2.5 11/18/2019 0222   VLDL 9 11/18/2019 0222   LDLCALC 77 02/23/2021 1122    Physical Exam:    VS:  There were no vitals taken for this visit.    Wt Readings from Last 3 Encounters:  03/21/21 177 lb 3.2 oz (80.4 kg)  03/02/21 171 lb (77.6 kg)  02/23/21 171 lb 4.8 oz (77.7 kg)     GEN: ***. No acute distress HEENT: Normal NECK: No JVD. LYMPHATICS: No lymphadenopathy CARDIAC: *** murmur. RRR *** gallop, or edema. VASCULAR: *** Normal Pulses. No bruits. RESPIRATORY:  Clear to auscultation without rales, wheezing or rhonchi  ABDOMEN: Soft, non-tender, non-distended, No pulsatile mass, MUSCULOSKELETAL: No deformity  SKIN: Warm and dry NEUROLOGIC:  Alert and oriented x 3 PSYCHIATRIC:  Normal affect   ASSESSMENT:    1. NSTEMI (non-ST elevated myocardial infarction) (HCC)   2. S/P CABG x 3   3. OSA (obstructive sleep apnea)   4. Bilateral carotid artery stenosis   5. Right bundle branch block   6. Mixed hyperlipidemia   7. Prediabetes   8. Essential hypertension   9. Pulmonary hypertension, unspecified (HCC)   10. PAD (peripheral artery disease) (HCC)    PLAN:    In order of problems listed above:  ***   Medication Adjustments/Labs and Tests Ordered: Current medicines are reviewed at length with the patient today.  Concerns regarding medicines are outlined above.  No orders of the defined types were placed in this encounter.  No orders of the defined types were placed in this encounter.   There are no Patient Instructions on file for this visit.   Signed, Lesleigh Noe, MD  04/29/2021 11:55 AM    [facility]

## 2021-05-01 ENCOUNTER — Ambulatory Visit: Payer: Medicare PPO | Admitting: Interventional Cardiology

## 2021-05-01 DIAGNOSIS — Z951 Presence of aortocoronary bypass graft: Secondary | ICD-10-CM

## 2021-05-01 DIAGNOSIS — I10 Essential (primary) hypertension: Secondary | ICD-10-CM

## 2021-05-01 DIAGNOSIS — R7303 Prediabetes: Secondary | ICD-10-CM

## 2021-05-01 DIAGNOSIS — I6523 Occlusion and stenosis of bilateral carotid arteries: Secondary | ICD-10-CM

## 2021-05-01 DIAGNOSIS — G4733 Obstructive sleep apnea (adult) (pediatric): Secondary | ICD-10-CM

## 2021-05-01 DIAGNOSIS — I4510 Unspecified right bundle-branch block: Secondary | ICD-10-CM

## 2021-05-01 DIAGNOSIS — I214 Non-ST elevation (NSTEMI) myocardial infarction: Secondary | ICD-10-CM

## 2021-05-01 DIAGNOSIS — I272 Pulmonary hypertension, unspecified: Secondary | ICD-10-CM

## 2021-05-01 DIAGNOSIS — E782 Mixed hyperlipidemia: Secondary | ICD-10-CM

## 2021-05-01 DIAGNOSIS — I739 Peripheral vascular disease, unspecified: Secondary | ICD-10-CM

## 2021-05-09 ENCOUNTER — Encounter: Payer: Medicare PPO | Admitting: Physical Medicine & Rehabilitation

## 2021-05-12 DIAGNOSIS — G4733 Obstructive sleep apnea (adult) (pediatric): Secondary | ICD-10-CM | POA: Diagnosis not present

## 2021-05-14 DIAGNOSIS — I739 Peripheral vascular disease, unspecified: Secondary | ICD-10-CM | POA: Diagnosis not present

## 2021-05-14 DIAGNOSIS — G4733 Obstructive sleep apnea (adult) (pediatric): Secondary | ICD-10-CM | POA: Diagnosis not present

## 2021-05-17 ENCOUNTER — Other Ambulatory Visit: Payer: Self-pay | Admitting: Nurse Practitioner

## 2021-05-17 DIAGNOSIS — J309 Allergic rhinitis, unspecified: Secondary | ICD-10-CM

## 2021-05-22 DIAGNOSIS — M75122 Complete rotator cuff tear or rupture of left shoulder, not specified as traumatic: Secondary | ICD-10-CM | POA: Diagnosis not present

## 2021-05-23 ENCOUNTER — Ambulatory Visit: Payer: Medicare PPO | Admitting: Orthopaedic Surgery

## 2021-06-02 DIAGNOSIS — H35372 Puckering of macula, left eye: Secondary | ICD-10-CM | POA: Diagnosis not present

## 2021-06-02 DIAGNOSIS — H34231 Retinal artery branch occlusion, right eye: Secondary | ICD-10-CM | POA: Diagnosis not present

## 2021-06-02 DIAGNOSIS — H40013 Open angle with borderline findings, low risk, bilateral: Secondary | ICD-10-CM | POA: Diagnosis not present

## 2021-06-13 ENCOUNTER — Ambulatory Visit: Payer: Medicare PPO | Admitting: Podiatry

## 2021-06-14 DIAGNOSIS — G4733 Obstructive sleep apnea (adult) (pediatric): Secondary | ICD-10-CM | POA: Diagnosis not present

## 2021-06-14 DIAGNOSIS — I739 Peripheral vascular disease, unspecified: Secondary | ICD-10-CM | POA: Diagnosis not present

## 2021-07-04 ENCOUNTER — Encounter: Payer: Medicare PPO | Admitting: Physical Medicine & Rehabilitation

## 2021-07-26 ENCOUNTER — Encounter: Payer: Medicare PPO | Admitting: Nurse Practitioner

## 2021-08-23 ENCOUNTER — Other Ambulatory Visit: Payer: Self-pay | Admitting: Nurse Practitioner

## 2021-08-23 DIAGNOSIS — J309 Allergic rhinitis, unspecified: Secondary | ICD-10-CM

## 2021-08-28 ENCOUNTER — Encounter: Payer: Self-pay | Admitting: Podiatry

## 2021-08-28 ENCOUNTER — Other Ambulatory Visit: Payer: Self-pay

## 2021-08-28 ENCOUNTER — Ambulatory Visit: Payer: Medicare PPO | Admitting: Podiatry

## 2021-08-28 DIAGNOSIS — M79675 Pain in left toe(s): Secondary | ICD-10-CM

## 2021-08-28 DIAGNOSIS — I739 Peripheral vascular disease, unspecified: Secondary | ICD-10-CM

## 2021-08-28 DIAGNOSIS — M79674 Pain in right toe(s): Secondary | ICD-10-CM

## 2021-08-28 DIAGNOSIS — Q828 Other specified congenital malformations of skin: Secondary | ICD-10-CM | POA: Diagnosis not present

## 2021-08-28 DIAGNOSIS — B351 Tinea unguium: Secondary | ICD-10-CM

## 2021-08-29 ENCOUNTER — Other Ambulatory Visit: Payer: Self-pay

## 2021-08-29 DIAGNOSIS — I739 Peripheral vascular disease, unspecified: Secondary | ICD-10-CM

## 2021-08-29 DIAGNOSIS — I6523 Occlusion and stenosis of bilateral carotid arteries: Secondary | ICD-10-CM

## 2021-09-03 NOTE — Progress Notes (Signed)
Subjective: [name] is a 86 y.o. male patient seen today for follow up of  for at risk foot care. Patient has h/o PAD and painful elongated mycotic toenails 1-5 bilaterally which are tender when wearing enclosed shoe gear. Pain is relieved with periodic professional debridement..   New problem(s)/concern(s) today: None    He is accompanied by his niece, Weldon Picking, on today's visit.  PCP is [name], FNP. Last visit was: 03/21/2021. Patient has h/o PAD and was last seen by Dr. Fabienne Bruns in Vascular Surgery.on 01/19/2021.  No Known Allergies  Objective: Physical Exam  General: Patient is a pleasant 86 y.o. African American male WD, WN in NAD. AAO x 3.   Neurovascular Examination: CFT <4 seconds b/l LE. Faintly palpable pedal pulses b/l. Pedal hair absent. No pain with calf compression b/l. Trace edema noted BLE. No ischemia or gangrene noted b/l LE. No cyanosis or clubbing noted b/l LE.  Protective sensation intact 5/5 intact bilaterally with 10g monofilament b/l.  Dermatological:  Pedal skin is warm and supple b/l LE. No open wounds b/l LE. No interdigital macerations noted b/l LE. Toenails 1-5 b/l elongated, discolored, dystrophic, thickened, crumbly with subungual debris and tenderness to dorsal palpation. Porokeratotic lesion(s) R 3rd toe and submet head 2 right foot. No erythema, no edema, no drainage, no fluctuance.  Musculoskeletal:  Muscle strength 5/5 to all lower extremity muscle groups bilaterally. HAV with bunion deformity noted b/l LE. Hammertoe deformity noted 2-5 b/l.  Assessment: 1. Pain due to onychomycosis of toenails of both feet   2. Porokeratosis   3. PAD (peripheral artery disease) (HCC)    Plan: Patient was evaluated and treated and all questions answered. Consent given for treatment as described below: -Mycotic toenails 1-5 left, R hallux, R 2nd toe, R 4th toe, and R 5th toe were debrided in length and girth with sterile nail nippers and  dremel without iatrogenic bleeding. -Painful porokeratotic lesion(s) R 3rd toe and submet head 2 right foot pared and enucleated with sterile scalpel blade. Pinpoint bleeding of  left 3rd digit addressed with Lumicain Hemostatic Solution. TAO and band-aid applied. Niece instructed to apply Neosporin qd for one week and call if he has any problems. Total number of lesions debrided=2. -Patient/POA to call should there be question/concern in the interim.  Return in about 3 months (around 11/26/2021).  Freddie Breech, DPM

## 2021-09-11 ENCOUNTER — Encounter (HOSPITAL_COMMUNITY): Payer: Medicare PPO

## 2021-09-11 ENCOUNTER — Ambulatory Visit: Payer: Medicare PPO

## 2021-09-11 ENCOUNTER — Inpatient Hospital Stay (HOSPITAL_COMMUNITY): Admission: RE | Admit: 2021-09-11 | Payer: Medicare PPO | Source: Ambulatory Visit

## 2021-09-18 DIAGNOSIS — R350 Frequency of micturition: Secondary | ICD-10-CM | POA: Diagnosis not present

## 2021-09-18 DIAGNOSIS — N402 Nodular prostate without lower urinary tract symptoms: Secondary | ICD-10-CM | POA: Diagnosis not present

## 2021-09-18 DIAGNOSIS — N401 Enlarged prostate with lower urinary tract symptoms: Secondary | ICD-10-CM | POA: Diagnosis not present

## 2021-09-18 DIAGNOSIS — R972 Elevated prostate specific antigen [PSA]: Secondary | ICD-10-CM | POA: Diagnosis not present

## 2021-10-05 ENCOUNTER — Ambulatory Visit: Payer: Medicare PPO | Admitting: Physician Assistant

## 2021-10-05 ENCOUNTER — Other Ambulatory Visit: Payer: Self-pay

## 2021-10-05 ENCOUNTER — Ambulatory Visit (HOSPITAL_COMMUNITY)
Admission: RE | Admit: 2021-10-05 | Discharge: 2021-10-05 | Disposition: A | Discharge status: Home / Self Care | Payer: Medicare PPO | Source: Ambulatory Visit | Attending: Surgery | Admitting: Surgery

## 2021-10-05 ENCOUNTER — Ambulatory Visit (INDEPENDENT_AMBULATORY_CARE_PROVIDER_SITE_OTHER)
Admission: RE | Admit: 2021-10-05 | Discharge: 2021-10-05 | Disposition: A | Discharge status: Home / Self Care | Payer: Medicare PPO | Source: Ambulatory Visit | Attending: Surgery | Admitting: Surgery

## 2021-10-05 VITALS — BP 138/80 | HR 58 | Temp 97.2°F | Ht 70.0 in | Wt 170.9 lb

## 2021-10-05 DIAGNOSIS — I739 Peripheral vascular disease, unspecified: Secondary | ICD-10-CM | POA: Insufficient documentation

## 2021-10-05 DIAGNOSIS — I6523 Occlusion and stenosis of bilateral carotid arteries: Secondary | ICD-10-CM | POA: Diagnosis not present

## 2021-10-05 NOTE — Progress Notes (Signed)
Office Note     CC:  follow up Requesting Provider:  [name], FNP  HPI: [name] is a 86 y.o. (Feb 02, 1936) male who presents for surveillance of carotid artery stenosis and PAD.  He has a known occlusion of the right common iliac artery noted on aortogram by Dr. Eldridge Dace in April 2021.  He denies claudication of the right lower extremity.  He also denies claudication of the left lower extremity.  He does not have any rest pain or tissue loss.  He also denies any diagnosis of CVA or TIA since last office visit.  He does not have any current strokelike symptoms including slurring speech, changes in vision, or one-sided weakness.  He has had history of stroke with persistent right-sided deficit.  He is accompanied by his niece today.  He is on aspirin and Plavix daily.   Past Medical History:  Diagnosis Date   Arthritis    Bundle branch block    Hypercholesteremia    Hyperlipidemia 2001   Hypertension 2001   Hypertension    Peripheral vascular disease (HCC)    Stroke [facility])    Stroke (HCC)    2001 - R side deficits    Past Surgical History:  Procedure Laterality Date   ABDOMINAL AORTOGRAM N/A 11/18/2019   Procedure: ABDOMINAL AORTOGRAM;  Surgeon: Corky Crafts, MD;  Location: [facility] INVASIVE CV LAB;  Service: Cardiovascular;  Laterality: N/A;   CORONARY ARTERY BYPASS GRAFT N/A 11/20/2019   Procedure: CORONARY ARTERY BYPASS GRAFTING (CABG), ON PUMP, TIMES THREE, USING LEFT INTERNAL MAMMARY ARTERY AND ENDOSCOPICALLY HARVESTED RIGHT GREATER SAPHENOUS VEIN;  Surgeon: Linden Dolin, MD;  Location: [facility] OR;  Service: Open Heart Surgery;  Laterality: N/A;   LEFT HEART CATH AND CORONARY ANGIOGRAPHY N/A 11/18/2019   Procedure: LEFT HEART CATH AND CORONARY ANGIOGRAPHY;  Surgeon: Corky Crafts, MD;  Location: [facility] INVASIVE CV LAB;  Service: Cardiovascular;  Laterality: N/A;   TEE WITHOUT CARDIOVERSION N/A 11/20/2019   Procedure: TRANSESOPHAGEAL ECHOCARDIOGRAM (TEE);  Surgeon:  Linden Dolin, MD;  Location: [facility] OR;  Service: Open Heart Surgery;  Laterality: N/A;    Social History   Socioeconomic History   Marital status: Single    Spouse name: Not on file   Number of children: Not on file   Years of education: Not on file   Highest education level: Not on file  Occupational History   Occupation: retired  Tobacco Use   Smoking status: Never   Smokeless tobacco: Never  Vaping Use   Vaping Use: Not on file  Substance and Sexual Activity   Alcohol use: Never   Drug use: Never   Sexual activity: Not Currently  Other Topics Concern   Not on file  Social History Narrative   ** Merged History Encounter **       Social Determinants of Health   Financial Resource Strain: Low Risk    Difficulty of Paying Living Expenses: Not hard at all  Food Insecurity: No Food Insecurity   Worried About Programme researcher, broadcasting/film/video in the Last Year: Never true   Ran Out of Food in the Last Year: Never true  Transportation Needs: No Transportation Needs   Lack of Transportation (Medical): No   Lack of Transportation (Non-Medical): No  Physical Activity: Inactive   Days of Exercise per Week: 0 days   Minutes of Exercise per Session: 0 min  Stress: No Stress Concern Present   Feeling of Stress : Not at all  Social  Connections: Not on file  Intimate Partner Violence: Not on file    Family History  Problem Relation Age of Onset   Stroke Mother    Stroke Father    Heart attack Father    Cancer Brother     Current Outpatient Medications  Medication Sig Dispense Refill   aspirin EC 81 MG tablet Take 81 mg by mouth daily.     cetirizine (ZYRTEC) 10 MG tablet Take 10 mg by mouth as needed for allergies.     Cholecalciferol (VITAMIN D) 50 MCG (2000 UT) tablet Take 2,000 Units by mouth daily.     clopidogrel (PLAVIX) 75 MG tablet TAKE 1 TABLET BY MOUTH DAILY 90 tablet 1   diclofenac sodium (VOLTAREN) 1 % GEL Apply 2 g topically 4 (four) times daily. Rub into affected area  of foot 2 to 4 times daily 100 g 2   fluticasone (FLONASE) 50 MCG/ACT nasal spray Place 2 sprays into both nostrils daily. 16 g 2   montelukast (SINGULAIR) 10 MG tablet TAKE 1 TABLET(10 MG) BY MOUTH DAILY 30 tablet 2   polyethylene glycol (MIRALAX / GLYCOLAX) 17 g packet Take 17 g by mouth daily. 14 each 0   rosuvastatin (CRESTOR) 20 MG tablet TAKE 1 TABLET(20 MG) BY MOUTH DAILY 30 tablet 3   tamsulosin (FLOMAX) 0.4 MG CAPS capsule      zinc gluconate 50 MG tablet Take 50 mg by mouth daily.     acetaminophen (TYLENOL) 325 MG tablet Take 2 tablets (650 mg total) by mouth every 4 (four) hours as needed for headache or mild pain. (Patient not taking: Reported on 10/05/2021)     traMADol (ULTRAM) 50 MG tablet Take 1 tablet (50 mg total) by mouth every 6 (six) hours as needed for moderate pain. (Patient not taking: Reported on 10/05/2021) 30 tablet 0   No current facility-administered medications for this visit.    No Known Allergies   REVIEW OF SYSTEMS:   [X]  denotes positive finding, [ ]  denotes negative finding Cardiac  Comments:  Chest pain or chest pressure:    Shortness of breath upon exertion:    Short of breath when lying flat:    Irregular heart rhythm:        Vascular    Pain in calf, thigh, or hip brought on by ambulation:    Pain in feet at night that wakes you up from your sleep:     Blood clot in your veins:    Leg swelling:         Pulmonary    Oxygen at home:    Productive cough:     Wheezing:         Neurologic    Sudden weakness in arms or legs:     Sudden numbness in arms or legs:     Sudden onset of difficulty speaking or slurred speech:    Temporary loss of vision in one eye:     Problems with dizziness:         Gastrointestinal    Blood in stool:     Vomited blood:         Genitourinary    Burning when urinating:     Blood in urine:        Psychiatric    Major depression:         Hematologic    Bleeding problems:    Problems with blood clotting too  easily:        Skin  Rashes or ulcers:        Constitutional    Fever or chills:      PHYSICAL EXAMINATION:  Vitals:   10/05/21 1134 10/05/21 1136  BP: (!) 146/69 138/80  Pulse: (!) 56 (!) 58  Temp: (!) 97.2 F (36.2 C)   Weight: 170 lb 14.4 oz (77.5 kg)   Height: 5\' 10"  (1.778 m)     General:  WDWN in NAD; vital signs documented above Gait: Not observed HENT: WNL, normocephalic Pulmonary: normal non-labored breathing , without Rales, rhonchi,  wheezing Cardiac: regular HR Abdomen: soft, NT, no masses Skin: without rashes Vascular Exam/Pulses:  Right Left  Radial 2+ (normal) 2+ (normal)  DP absent 1+ (weak)  PT absent absent   Extremities: without ischemic changes, without Gangrene , without cellulitis; without open wounds;  Musculoskeletal: no muscle wasting or atrophy  Neurologic: A&O X 3; cranial nerves grossly intact Psychiatric:  The pt has Normal affect.   Non-Invasive Vascular Imaging:   Bilateral ICA 1 to 39% stenosis   ABI/TBI Today's ABI Today's TBI Previous ABI Previous TBI   +-------+-----------+-----------+------------+------------+   Right   0.61        0.35        0.62         0.32           +-------+-----------+-----------+------------+------------+   Left    0.93        0.51        0.92         0.55            ASSESSMENT/PLAN:: 86 y.o. male here for follow up for surveillance of carotid artery stenosis as well as PAD  -Carotid duplex unchanged over the past several years with 1 to 39% stenosis of internal carotid artery bilaterally.  I discussed with the patient and his niece the need for continued surveillance.  We all came to the agreement that we will no longer check an annual carotid duplex. -ABIs are also unchanged over the past year.  Despite known right common iliac artery occlusion he denies any claudication.  He is without rest pain or tissue loss of bilateral lower extremities.  No indication for revascularization of right common  iliac artery at this time.  We will repeat ABIs in 1 year. -Continue aspirin and Plavix daily -Call/return office sooner with any questions or concerns or if patient develops any claudication, rest pain, or tissue loss   97, PA-C [facility] [phone]  Clinic MD:   [phone]

## 2021-10-26 ENCOUNTER — Other Ambulatory Visit: Payer: Self-pay

## 2021-10-26 MED ORDER — CLOPIDOGREL BISULFATE 75 MG PO TABS: 75.0000 mg | ORAL_TABLET | Freq: Every day | ORAL | 0 refills | Status: DC

## 2021-10-31 ENCOUNTER — Ambulatory Visit: Payer: Medicare PPO | Admitting: Podiatry

## 2021-11-06 ENCOUNTER — Encounter: Payer: Self-pay | Admitting: Nurse Practitioner

## 2021-11-06 ENCOUNTER — Other Ambulatory Visit: Payer: Self-pay | Admitting: Nurse Practitioner

## 2021-11-06 ENCOUNTER — Ambulatory Visit: Payer: Medicare PPO | Admitting: Nurse Practitioner

## 2021-11-06 VITALS — BP 122/60 | HR 60 | Temp 98.1°F | Ht 70.0 in | Wt 171.4 lb

## 2021-11-06 DIAGNOSIS — E782 Mixed hyperlipidemia: Secondary | ICD-10-CM | POA: Diagnosis not present

## 2021-11-06 DIAGNOSIS — I272 Pulmonary hypertension, unspecified: Secondary | ICD-10-CM | POA: Diagnosis not present

## 2021-11-06 DIAGNOSIS — I10 Essential (primary) hypertension: Secondary | ICD-10-CM

## 2021-11-06 DIAGNOSIS — J309 Allergic rhinitis, unspecified: Secondary | ICD-10-CM

## 2021-11-06 DIAGNOSIS — G8111 Spastic hemiplegia affecting right dominant side: Secondary | ICD-10-CM | POA: Diagnosis not present

## 2021-11-06 DIAGNOSIS — R7303 Prediabetes: Secondary | ICD-10-CM | POA: Diagnosis not present

## 2021-11-06 DIAGNOSIS — I480 Paroxysmal atrial fibrillation: Secondary | ICD-10-CM | POA: Diagnosis not present

## 2021-11-06 DIAGNOSIS — H918X3 Other specified hearing loss, bilateral: Secondary | ICD-10-CM | POA: Diagnosis not present

## 2021-11-06 MED ORDER — ROSUVASTATIN CALCIUM 20 MG PO TABS: ORAL_TABLET | ORAL | 1 refills | Status: DC

## 2021-11-06 MED ORDER — CLOPIDOGREL BISULFATE 75 MG PO TABS: 75.0000 mg | ORAL_TABLET | Freq: Every day | ORAL | 1 refills | Status: DC

## 2021-11-06 MED ORDER — TAMSULOSIN HCL 0.4 MG PO CAPS: 0.4000 mg | ORAL_CAPSULE | Freq: Every day | ORAL | 1 refills | Status: DC

## 2021-11-06 NOTE — Progress Notes (Signed)
?Industrial/product designer as a Education administrator for [employer], FNP.,have documented all relevant documentation on the behalf of [name], FNP,as directed by  [name], FNP while in the presence of [name], [place]. ? ?This visit occurred during the SARS-CoV-2 public health emergency.  Safety protocols were in place, including screening questions prior to the visit, additional usage of staff PPE, and extensive cleaning of exam room while observing appropriate contact time as indicated for disinfecting solutions. ? ?Subjective:  ?  ? Patient ID: [name] , male    DOB: 05/06/1936 , 86 y.o.   MRN: [id number] ? ? ?Chief Complaint  ?Patient presents with  ? Hypertension  ? ? ?HPI ? ?Patient is here for HTN follow up. He is accompanied by his niece [place]. Reports he is doing well. He now has a new brace.  ? ?Hypertension ?This is a chronic problem. The current episode started more than 1 year ago. The problem is unchanged. The problem is controlled. Pertinent negatives include no anxiety, blurred vision, chest pain, headaches, malaise/fatigue, palpitations, peripheral edema or shortness of breath. There are no associated agents to hypertension. Risk factors for coronary artery disease include sedentary lifestyle. There is no history of kidney disease. There is no history of chronic renal disease.   ? ?Past Medical History:  ?Diagnosis Date  ? Arthritis   ? Bundle branch block   ? Hypercholesteremia   ? Hyperlipidemia 2001  ? Hypertension 2001  ? Hypertension   ? Peripheral vascular disease ([place])   ? Stroke [facility])   ? Stroke [facility])   ? 2001 - R side deficits  ?  ? ?Family History  ?Problem Relation Age of Onset  ? Stroke Mother   ? Stroke Father   ? Heart attack Father   ? Cancer Brother   ? ? ? ?Current Outpatient Medications:  ?  acetaminophen (TYLENOL) 325 MG tablet, Take 2 tablets (650 mg total) by mouth every 4 (four) hours as needed for headache or mild pain., Disp:  , Rfl:  ?  aspirin EC 81 MG tablet, Take 81  mg by mouth daily., Disp: , Rfl:  ?  cetirizine (ZYRTEC) 10 MG tablet, Take 10 mg by mouth as needed for allergies., Disp: , Rfl:  ?  Cholecalciferol (VITAMIN D) 50 MCG (2000 UT) tablet, Take 2,000 Units by mouth daily., Disp: , Rfl:  ?  diclofenac sodium (VOLTAREN) 1 % GEL, Apply 2 g topically 4 (four) times daily. Rub into affected area of foot 2 to 4 times daily, Disp: 100 g, Rfl: 2 ?  fluticasone (FLONASE) 50 MCG/ACT nasal spray, Place 2 sprays into both nostrils daily., Disp: 16 g, Rfl: 2 ?  montelukast (SINGULAIR) 10 MG tablet, TAKE 1 TABLET(10 MG) BY MOUTH DAILY, Disp: 30 tablet, Rfl: 2 ?  polyethylene glycol (MIRALAX / GLYCOLAX) 17 g packet, Take 17 g by mouth daily., Disp: 14 each, Rfl: 0 ?  zinc gluconate 50 MG tablet, Take 50 mg by mouth daily., Disp: , Rfl:  ?  clopidogrel (PLAVIX) 75 MG tablet, Take 1 tablet (75 mg total) by mouth daily., Disp: 90 tablet, Rfl: 1 ?  rosuvastatin (CRESTOR) 20 MG tablet, TAKE 1 TABLET(20 MG) BY MOUTH DAILY, Disp: 90 tablet, Rfl: 1 ?  tamsulosin (FLOMAX) 0.4 MG CAPS capsule, Take 1 capsule (0.4 mg total) by mouth daily., Disp: 90 capsule, Rfl: 1  ? ?No Known Allergies  ? ?Review of Systems  ?Constitutional: Negative.  Negative for malaise/fatigue.  ?Eyes:  Negative for  blurred vision.  ?Respiratory: Negative.  Negative for shortness of breath and wheezing.   ?Cardiovascular: Negative.  Negative for chest pain and palpitations.  ?Gastrointestinal: Negative.   ?Neurological: Negative.  Negative for headaches.  ?Psychiatric/Behavioral: Negative.     ? ?Today's Vitals  ? 11/06/21 1414  ?BP: 122/60  ?Pulse: 60  ?Temp: 98.1 ?F (36.7 ?C)  ?TempSrc: Oral  ?Weight: 171 lb 6.4 oz (77.7 kg)  ?Height: _0  (1.778 m)  ? ?Body mass index is 24.59 kg/m?.  ?Wt Readings from Last 3 Encounters:  ?11/06/21 171 lb 6.4 oz (77.7 kg)  ?10/05/21 170 lb 14.4 oz (77.5 kg)  ?03/21/21 177 lb 3.2 oz (80.4 kg)  ? ? ?Objective:  ?Physical Exam ?Vitals reviewed.  ?Constitutional:   ?   General: He is  not in acute distress. ?   Appearance: Normal appearance.  ?Cardiovascular:  ?   Rate and Rhythm: Normal rate and regular rhythm.  ?   Pulses: Normal pulses.  ?   Heart sounds: Normal heart sounds. No murmur heard. ?Pulmonary:  ?   Effort: Pulmonary effort is normal. No respiratory distress.  ?   Breath sounds: Normal breath sounds.  ?Musculoskeletal:  ?   Comments: Wearing brace to right lower extremity and right arm hemiparesis  ?Skin: ?   Capillary Refill: Capillary refill takes less than 2 seconds.  ?Neurological:  ?   General: No focal deficit present.  ?   Mental Status: He is alert and oriented to person, place, and time.  ?   Cranial Nerves: No cranial nerve deficit.  ?Psychiatric:     ?   Mood and Affect: Mood normal.     ?   Behavior: Behavior normal.     ?   Thought Content: Thought content normal.     ?   Judgment: Judgment normal.  ?  ? ?   ?Assessment And Plan:  ?   ?1. Essential hypertension ?Comments: Blood pressure is well controlled, continue current medications ?- Hemoglobin A1c ? ?2. Prediabetes ?- CMP14+EGFR ? ?3. Mixed hyperlipidemia ?Comments: Cholesterol is stable, continue current medications, tolerating medications well.  ?- Lipid panel ? ?4. Right spastic hemiplegia ([place]) ?Comments: Has brace to right arm and leg ? ?5. Pulmonary hypertension, unspecified ([place]) ? ?6. Paroxysmal atrial fibrillation (HCC) ?Comments: Stable, continue clopidogrel ? ?7. Other specified hearing loss of both ears ?Comments: Patient has hearing aids but does not wear them, has been quite some time and needs a new evaluation ?- Ambulatory referral to ENT ?  ? ? ?Patient was given opportunity to ask questions. Patient verbalized understanding of the plan and was able to repeat key elements of the plan. All questions were answered to their satisfaction.  ?[name], FNP  ? ?I, [name], FNP, have reviewed all documentation for this visit. The documentation on 11/06/21 for the exam, diagnosis, procedures, and  orders are all accurate and complete.  ? ?IF YOU HAVE BEEN REFERRED TO A SPECIALIST, IT MAY TAKE 1-2 WEEKS TO SCHEDULE/PROCESS THE REFERRAL. IF YOU HAVE NOT HEARD FROM US/SPECIALIST IN TWO WEEKS, PLEASE GIVE [place] A CALL AT [phone].  ? ?THE PATIENT IS ENCOURAGED TO PRACTICE SOCIAL DISTANCING DUE TO THE COVID-19 PANDEMIC.   ?

## 2021-11-06 NOTE — Patient Instructions (Signed)

## 2021-11-07 LAB — CMP14+EGFR
ALT: 26 IU/L (ref 0–44)
AST: 29 IU/L (ref 0–40)
Albumin/Globulin Ratio: 1.6 (ref 1.2–2.2)
Albumin: 4.4 g/dL (ref 3.6–4.6)
Alkaline Phosphatase: 93 IU/L (ref 44–121)
BUN/Creatinine Ratio: 11 (ref 10–24)
BUN: 13 mg/dL (ref 8–27)
Bilirubin Total: 1.1 mg/dL (ref 0.0–1.2)
CO2: 20 mmol/L (ref 20–29)
Calcium: 9.5 mg/dL (ref 8.6–10.2)
Chloride: 107 mmol/L — ABNORMAL HIGH (ref 96–106)
Creatinine, Ser: 1.16 mg/dL (ref 0.76–1.27)
Globulin, Total: 2.7 g/dL (ref 1.5–4.5)
Glucose: 91 mg/dL (ref 70–99)
Potassium: 4.5 mmol/L (ref 3.5–5.2)
Sodium: 142 mmol/L (ref 134–144)
Total Protein: 7.1 g/dL (ref 6.0–8.5)
eGFR: 62 mL/min/{1.73_m2} (ref >59)

## 2021-11-07 LAB — HEMOGLOBIN A1C
Est. average glucose Bld gHb Est-mCnc: 126 mg/dL
Hgb A1c MFr Bld: 6 % — ABNORMAL HIGH (ref 4.8–5.6)

## 2021-11-07 LAB — LIPID PANEL
Chol/HDL Ratio: 2.6 ratio (ref 0.0–5.0)
Cholesterol, Total: 125 mg/dL (ref 100–199)
HDL: 48 mg/dL (ref >39)
LDL Chol Calc (NIH): 62 mg/dL (ref 0–99)
Triglycerides: 74 mg/dL (ref 0–149)
VLDL Cholesterol Cal: 15 mg/dL (ref 5–40)

## 2022-01-15 ENCOUNTER — Encounter: Payer: Medicare PPO | Admitting: Nurse Practitioner

## 2022-01-17 ENCOUNTER — Ambulatory Visit: Payer: Medicare PPO | Admitting: Podiatry

## 2022-01-17 ENCOUNTER — Encounter: Payer: Self-pay | Admitting: Podiatry

## 2022-01-17 DIAGNOSIS — M79675 Pain in left toe(s): Secondary | ICD-10-CM

## 2022-01-17 DIAGNOSIS — L909 Atrophic disorder of skin, unspecified: Secondary | ICD-10-CM | POA: Diagnosis not present

## 2022-01-17 DIAGNOSIS — I739 Peripheral vascular disease, unspecified: Secondary | ICD-10-CM | POA: Diagnosis not present

## 2022-01-17 DIAGNOSIS — Q828 Other specified congenital malformations of skin: Secondary | ICD-10-CM | POA: Diagnosis not present

## 2022-01-17 DIAGNOSIS — M79674 Pain in right toe(s): Secondary | ICD-10-CM

## 2022-01-17 DIAGNOSIS — B351 Tinea unguium: Secondary | ICD-10-CM | POA: Diagnosis not present

## 2022-01-22 NOTE — Progress Notes (Signed)
  Subjective:  Patient ID: [name], male    DOB: 08/03/1936,  MRN: [id number]  [name] presents to clinic today for for at risk foot care. Patient has h/o PAD and painful porokeratotic lesions b/l lower extremities. Pain prevent(s) comfortable ambulation. Aggravating factor is weightbearing with and without shoegear.  New problem(s):  Patient's niece is present on today's visit. Patient c/o right foot pain today and points to plantar aspect of both feet  PCP is [name], FNP , and last visit was November 20, 2021.  No Known Allergies  Review of Systems: Negative except as noted in the HPI.  Objective: No changes noted in today's physical examination. General: Patient is a pleasant 86 y.o. African American male WD, WN in NAD. AAO x 3.   Neurovascular Examination: CFT <4 seconds b/l LE. Faintly palpable pedal pulses b/l. Pedal hair absent. No pain with calf compression b/l. Trace edema noted BLE. No ischemia or gangrene noted b/l LE. No cyanosis or clubbing noted b/l LE.  Protective sensation intact 5/5 intact bilaterally with 10g monofilament b/l.  Dermatological:  Pedal skin is warm and supple b/l LE. No open wounds b/l LE. No interdigital macerations noted b/l LE. Toenails 1-5 b/l elongated, discolored, dystrophic, thickened, crumbly with subungual debris and tenderness to dorsal palpation. Porokeratotic lesion(s) R 3rd toe. Submet head 2 right foot lesion resolved. No erythema, no edema, no drainage, no fluctuance.  Musculoskeletal:  Muscle strength 5/5 to all lower extremity muscle groups bilaterally. HAV with bunion deformity.  Wearing AFO on right lower extremity. Plantar fat pad atrophy of forefoot area b/l lower extremities.     Latest Ref Rng & Units 11/06/2021    2:45 PM 02/23/2021   11:22 AM  Hemoglobin A1C  Hemoglobin-A1c 4.8 - 5.6 % 6.0  6.2    Assessment/Plan: 1. Pain due to onychomycosis of toenails of both feet   2. Porokeratosis   3. Plantar fat pad  atrophy   4. PAD (peripheral artery disease) (HCC)     -Patient was evaluated and treated. All patient's and/or POA's questions/concerns answered on today's visit. -Toenails 1-5 b/l were debrided in length and girth with sterile nail nippers and dremel without iatrogenic bleeding.  -Porokeratotic lesion(s) distal tip right 3rd toe pared and enucleated with sterile scalpel blade without incident. Total number of lesions debrided=1. -Dispensed gel metatarsal pad. Apply to right foot every morning. Remove every evening. -Patient/POA to call should there be question/concern in the interim.   Return in about 9 weeks (around 03/21/2022).  Freddie Breech, DPM

## 2022-01-26 DIAGNOSIS — H40013 Open angle with borderline findings, low risk, bilateral: Secondary | ICD-10-CM | POA: Diagnosis not present

## 2022-02-08 ENCOUNTER — Encounter: Payer: Medicare PPO | Admitting: Nurse Practitioner

## 2022-02-15 DIAGNOSIS — H903 Sensorineural hearing loss, bilateral: Secondary | ICD-10-CM | POA: Diagnosis not present

## 2022-02-19 ENCOUNTER — Other Ambulatory Visit: Payer: Self-pay

## 2022-02-19 DIAGNOSIS — J309 Allergic rhinitis, unspecified: Secondary | ICD-10-CM

## 2022-02-19 MED ORDER — MONTELUKAST SODIUM 10 MG PO TABS: ORAL_TABLET | ORAL | 2 refills | Status: DC

## 2022-03-14 ENCOUNTER — Ambulatory Visit: Payer: Medicare PPO

## 2022-03-14 ENCOUNTER — Ambulatory Visit: Payer: Medicare PPO | Admitting: Nurse Practitioner

## 2022-03-21 ENCOUNTER — Ambulatory Visit (INDEPENDENT_AMBULATORY_CARE_PROVIDER_SITE_OTHER): Payer: Medicare PPO

## 2022-03-21 VITALS — Ht 69.0 in | Wt 175.0 lb

## 2022-03-21 DIAGNOSIS — Z0000 Encounter for general adult medical examination without abnormal findings: Secondary | ICD-10-CM

## 2022-03-21 NOTE — Patient Instructions (Signed)
[name] , Thank you for taking time to come for your Medicare Wellness Visit. I appreciate your ongoing commitment to your health goals. Please review the following plan we discussed and let me know if I can assist you in the future.   Screening recommendations/referrals: Colonoscopy: not required Recommended yearly ophthalmology/optometry visit for glaucoma screening and checkup Recommended yearly dental visit for hygiene and checkup  Vaccinations: Influenza vaccine: due Pneumococcal vaccine: due Tdap vaccine: completed 11/03/2019, due 11/02/2029 Shingles vaccine: discussed   Covid-19:  07/25/2021, 08/20/2020, 01/12/2020, 12/10/2019  Advanced directives: Please bring a copy of your POA (Power of Attorney) and/or Living Will to your next appointment.   Conditions/risks identified: none  Next appointment: Follow up in one year for your annual wellness visit.   Preventive Care 84 Years and Older, Male Preventive care refers to lifestyle choices and visits with your health care provider that can promote health and wellness. What does preventive care include? A yearly physical exam. This is also called an annual well check. Dental exams once or twice a year. Routine eye exams. Ask your health care provider how often you should have your eyes checked. Personal lifestyle choices, including: Daily care of your teeth and gums. Regular physical activity. Eating a healthy diet. Avoiding tobacco and drug use. Limiting alcohol use. Practicing safe sex. Taking low doses of aspirin every day. Taking vitamin and mineral supplements as recommended by your health care provider. What happens during an annual well check? The services and screenings done by your health care provider during your annual well check will depend on your age, overall health, lifestyle risk factors, and family history of disease. Counseling  Your health care provider may ask you questions about your: Alcohol use. Tobacco  use. Drug use. Emotional well-being. Home and relationship well-being. Sexual activity. Eating habits. History of falls. Memory and ability to understand (cognition). Work and work Astronomer. Screening  You may have the following tests or measurements: Height, weight, and BMI. Blood pressure. Lipid and cholesterol levels. These may be checked every 5 years, or more frequently if you are over 45 years old. Skin check. Lung cancer screening. You may have this screening every year starting at age 65 if you have a 30-pack-year history of smoking and currently smoke or have quit within the past 15 years. Fecal occult blood test (FOBT) of the stool. You may have this test every year starting at age 18. Flexible sigmoidoscopy or colonoscopy. You may have a sigmoidoscopy every 5 years or a colonoscopy every 10 years starting at age 33. Prostate cancer screening. Recommendations will vary depending on your family history and other risks. Hepatitis C blood test. Hepatitis B blood test. Sexually transmitted disease (STD) testing. Diabetes screening. This is done by checking your blood sugar (glucose) after you have not eaten for a while (fasting). You may have this done every 1-3 years. Abdominal aortic aneurysm (AAA) screening. You may need this if you are a current or former smoker. Osteoporosis. You may be screened starting at age 49 if you are at high risk. Talk with your health care provider about your test results, treatment options, and if necessary, the need for more tests. Vaccines  Your health care provider may recommend certain vaccines, such as: Influenza vaccine. This is recommended every year. Tetanus, diphtheria, and acellular pertussis (Tdap, Td) vaccine. You may need a Td booster every 10 years. Zoster vaccine. You may need this after age 68. Pneumococcal 13-valent conjugate (PCV13) vaccine. One dose is recommended after  age 24. Pneumococcal polysaccharide (PPSV23) vaccine.  One dose is recommended after age 13. Talk to your health care provider about which screenings and vaccines you need and how often you need them. This information is not intended to replace advice given to you by your health care provider. Make sure you discuss any questions you have with your health care provider. Document Released: 08/19/2015 Document Revised: 04/11/2016 Document Reviewed: 05/24/2015 Elsevier Interactive Patient Education  2017 [employer] Prevention in the Home Falls can cause injuries. They can happen to people of all ages. There are many things you can do to make your home safe and to help prevent falls. What can I do on the outside of my home? Regularly fix the edges of walkways and driveways and fix any cracks. Remove anything that might make you trip as you walk through a door, such as a raised step or threshold. Trim any bushes or trees on the path to your home. Use bright outdoor lighting. Clear any walking paths of anything that might make someone trip, such as rocks or tools. Regularly check to see if handrails are loose or broken. Make sure that both sides of any steps have handrails. Any raised decks and porches should have guardrails on the edges. Have any leaves, snow, or ice cleared regularly. Use sand or salt on walking paths during winter. Clean up any spills in your garage right away. This includes oil or grease spills. What can I do in the bathroom? Use night lights. Install grab bars by the toilet and in the tub and shower. Do not use towel bars as grab bars. Use non-skid mats or decals in the tub or shower. If you need to sit down in the shower, use a plastic, non-slip stool. Keep the floor dry. Clean up any water that spills on the floor as soon as it happens. Remove soap buildup in the tub or shower regularly. Attach bath mats securely with double-sided non-slip rug tape. Do not have throw rugs and other things on the floor that can make  you trip. What can I do in the bedroom? Use night lights. Make sure that you have a light by your bed that is easy to reach. Do not use any sheets or blankets that are too big for your bed. They should not hang down onto the floor. Have a firm chair that has side arms. You can use this for support while you get dressed. Do not have throw rugs and other things on the floor that can make you trip. What can I do in the kitchen? Clean up any spills right away. Avoid walking on wet floors. Keep items that you use a lot in easy-to-reach places. If you need to reach something above you, use a strong step stool that has a grab bar. Keep electrical cords out of the way. Do not use floor polish or wax that makes floors slippery. If you must use wax, use non-skid floor wax. Do not have throw rugs and other things on the floor that can make you trip. What can I do with my stairs? Do not leave any items on the stairs. Make sure that there are handrails on both sides of the stairs and use them. Fix handrails that are broken or loose. Make sure that handrails are as long as the stairways. Check any carpeting to make sure that it is firmly attached to the stairs. Fix any carpet that is loose or worn. Avoid having throw rugs  at the top or bottom of the stairs. If you do have throw rugs, attach them to the floor with carpet tape. Make sure that you have a light switch at the top of the stairs and the bottom of the stairs. If you do not have them, ask someone to add them for you. What else can I do to help prevent falls? Wear shoes that: Do not have high heels. Have rubber bottoms. Are comfortable and fit you well. Are closed at the toe. Do not wear sandals. If you use a stepladder: Make sure that it is fully opened. Do not climb a closed stepladder. Make sure that both sides of the stepladder are locked into place. Ask someone to hold it for you, if possible. Clearly mark and make sure that you can  see: Any grab bars or handrails. First and last steps. Where the edge of each step is. Use tools that help you move around (mobility aids) if they are needed. These include: Canes. Walkers. Scooters. Crutches. Turn on the lights when you go into a dark area. Replace any light bulbs as soon as they burn out. Set up your furniture so you have a clear path. Avoid moving your furniture around. If any of your floors are uneven, fix them. If there are any pets around you, be aware of where they are. Review your medicines with your doctor. Some medicines can make you feel dizzy. This can increase your chance of falling. Ask your doctor what other things that you can do to help prevent falls. This information is not intended to replace advice given to you by your health care provider. Make sure you discuss any questions you have with your health care provider. Document Released: 05/19/2009 Document Revised: 12/29/2015 Document Reviewed: 08/27/2014 Elsevier Interactive Patient Education  2017 [employer].

## 2022-03-21 NOTE — Progress Notes (Signed)
I connected with Theodis Aguas today by telephone and verified that I am speaking with the correct person using two identifiers. Location patient: home Location provider: work Persons participating in the virtual visit: Damico Partin, Lynden Ang (niece), Elisha Ponder LPN.   I discussed the limitations, risks, security and privacy concerns of performing an evaluation and management service by telephone and the availability of in person appointments. I also discussed with the patient that there may be a patient responsible charge related to this service. The patient expressed understanding and verbally consented to this telephonic visit.    Interactive audio and video telecommunications were attempted between this provider and patient, however failed, due to patient having technical difficulties OR patient did not have access to video capability.  We continued and completed visit with audio only.     Vital signs may be patient reported or missing.  Subjective:   [name] is a 86 y.o. male who presents for Medicare Annual/Subsequent preventive examination.  Review of Systems     Cardiac Risk Factors include: advanced age (>61men, >44 women);dyslipidemia;hypertension     Objective:    Today's Vitals   03/21/22 1555  Weight: 175 lb (79.4 kg)  Height: 5\' 9"  (1.753 m)   Body mass index is 25.84 kg/m.     03/21/2022    4:00 PM 02/23/2021   10:06 AM 01/13/2021   10:01 AM 02/18/2020   11:25 AM 11/19/2019   11:00 AM 11/04/2019    7:31 AM 11/03/2019    9:00 PM  Advanced Directives  Does Patient Have a Medical Advance Directive? Yes Yes Yes Yes No  No  Type of 11/05/2019 of Estate agent Power of [place];Living will Healthcare Power of [place] Power of Attorney     Does patient want to make changes to medical advance directive?   No - Patient declined      Copy of Healthcare Power of Attorney in Chart? No - copy requested No - copy requested  No  - copy requested     Would patient like information on creating a medical advance directive?     No - Patient declined No - Patient declined     Current Medications (verified) Outpatient Encounter Medications as of 03/21/2022  Medication Sig   acetaminophen (TYLENOL) 325 MG tablet Take 2 tablets (650 mg total) by mouth every 4 (four) hours as needed for headache or mild pain.   aspirin EC 81 MG tablet Take 81 mg by mouth daily.   cetirizine (ZYRTEC) 10 MG tablet Take 10 mg by mouth as needed for allergies.   Cholecalciferol (VITAMIN D) 50 MCG (2000 UT) tablet Take 2,000 Units by mouth daily.   clopidogrel (PLAVIX) 75 MG tablet Take 1 tablet (75 mg total) by mouth daily.   fluticasone (FLONASE) 50 MCG/ACT nasal spray Place 2 sprays into both nostrils daily.   montelukast (SINGULAIR) 10 MG tablet TAKE 1 TABLET(10 MG) BY MOUTH DAILY   polyethylene glycol (MIRALAX / GLYCOLAX) 17 g packet Take 17 g by mouth daily. (Patient taking differently: Take 17 g by mouth daily as needed.)   rosuvastatin (CRESTOR) 20 MG tablet TAKE 1 TABLET(20 MG) BY MOUTH DAILY   tamsulosin (FLOMAX) 0.4 MG CAPS capsule Take 1 capsule (0.4 mg total) by mouth daily.   zinc gluconate 50 MG tablet Take 50 mg by mouth daily.   diclofenac sodium (VOLTAREN) 1 % GEL Apply 2 g topically 4 (four) times daily. Rub into affected area of foot 2  to 4 times daily (Patient not taking: Reported on 03/21/2022)   No facility-administered encounter medications on file as of 03/21/2022.    Allergies (verified) Patient has no known allergies.   History: Past Medical History:  Diagnosis Date   Arthritis    Bundle branch block    Hypercholesteremia    Hyperlipidemia 2001   Hypertension 2001   Hypertension    Peripheral vascular disease ([place])    Stroke [facility])    Stroke ([place])    2001 - R side deficits   Past Surgical History:  Procedure Laterality Date   ABDOMINAL AORTOGRAM N/A 11/18/2019   Procedure: ABDOMINAL AORTOGRAM;  Surgeon:  Jettie Booze, MD;  Location: [facility];  Service: Cardiovascular;  Laterality: N/A;   CORONARY ARTERY BYPASS GRAFT N/A 11/20/2019   Procedure: CORONARY ARTERY BYPASS GRAFTING (CABG), ON PUMP, TIMES THREE, USING LEFT INTERNAL MAMMARY ARTERY AND ENDOSCOPICALLY HARVESTED RIGHT GREATER SAPHENOUS VEIN;  Surgeon: Wonda Olds, MD;  Location: [facility];  Service: Open Heart Surgery;  Laterality: N/A;   LEFT HEART CATH AND CORONARY ANGIOGRAPHY N/A 11/18/2019   Procedure: LEFT HEART CATH AND CORONARY ANGIOGRAPHY;  Surgeon: Jettie Booze, MD;  Location: [facility];  Service: Cardiovascular;  Laterality: N/A;   TEE WITHOUT CARDIOVERSION N/A 11/20/2019   Procedure: TRANSESOPHAGEAL ECHOCARDIOGRAM (TEE);  Surgeon: Wonda Olds, MD;  Location: [facility];  Service: Open Heart Surgery;  Laterality: N/A;   Family History  Problem Relation Age of Onset   Stroke Mother    Stroke Father    Heart attack Father    Cancer Brother    Social History   Socioeconomic History   Marital status: Single    Spouse name: Not on file   Number of children: Not on file   Years of education: Not on file   Highest education level: Not on file  Occupational History   Occupation: retired  Tobacco Use   Smoking status: Never   Smokeless tobacco: Never  Vaping Use   Vaping Use: Not on file  Substance and Sexual Activity   Alcohol use: Never   Drug use: Never   Sexual activity: Not Currently  Other Topics Concern   Not on file  Social History Narrative   ** Merged History Encounter **       Social Determinants of Health   Financial Resource Strain: Low Risk  (03/21/2022)   Overall Financial Resource Strain (CARDIA)    Difficulty of Paying Living Expenses: Not hard at all  Food Insecurity: No Food Insecurity (03/21/2022)   Hunger Vital Sign    Worried About Running Out of Food in the Last Year: Never true    Ran Out of Food in the Last Year: Never true  Transportation Needs: No  Transportation Needs (03/21/2022)   PRAPARE - Hydrologist (Medical): No    Lack of Transportation (Non-Medical): No  Physical Activity: Inactive (03/21/2022)   Exercise Vital Sign    Days of Exercise per Week: 0 days    Minutes of Exercise per Session: 0 min  Stress: No Stress Concern Present (03/21/2022)   [place]    Feeling of Stress : Not at all  Social Connections: Not on file    Tobacco Counseling Counseling given: Not Answered   Clinical Intake:  Pre-visit preparation completed: Yes  Pain : No/denies pain     Nutritional Status: BMI 25 -29 Overweight Nutritional Risks:  None Diabetes: No  How often do you need to have someone help you when you read instructions, pamphlets, or other written materials from your doctor or pharmacy?: 1 - Never  Diabetic? no  Interpreter Needed?: No  Information entered by :: NAllen LPN   Activities of Daily Living    03/21/2022    4:01 PM  In your present state of health, do you have any difficulty performing the following activities:  Hearing? 1  Vision? 0  Difficulty concentrating or making decisions? 0  Walking or climbing stairs? 0  Dressing or bathing? 0  Doing errands, shopping? 1  Preparing Food and eating ? N  Using the Toilet? N  In the past six months, have you accidently leaked urine? N  Do you have problems with loss of bowel control? N  Managing your Medications? Y  Managing your Finances? Y  Housekeeping or managing your Housekeeping? Y    Patient Care Team: [name], FNP as PCP - General ([place]) Belva Crome, MD as PCP - Cardiology (Cardiology) Sueanne Margarita, MD as PCP - Sleep Medicine (Cardiology) Monna Fam, MD as Consulting Physician (Ophthalmology) [facility], Michae Kava, MD as Attending Physician Carol Ada, MD as Consulting Physician (Gastroenterology) [name], FNP  (General Practice)  Indicate any recent Medical Services you may have received from other than [facility] providers in the past year (date may be approximate).     Assessment:   This is a routine wellness examination for [place].  Hearing/Vision screen Vision Screening - Comments:: Regular eye exams, Dr. Herbert Deaner  Dietary issues and exercise activities discussed: Current Exercise Habits: The patient does not participate in regular exercise at present   Goals Addressed             This Visit's Progress    Patient Stated       03/21/2022, no goals       Depression Screen    03/21/2022    4:00 PM 02/23/2021   10:07 AM 02/18/2020   11:27 AM 02/17/2019   11:23 AM 11/18/2018    3:02 PM 05/15/2018    3:13 PM  PHQ 2/9 Scores  PHQ - 2 Score 0 0 0 0 0 0  PHQ- 9 Score    0  0    Fall Risk    03/21/2022    4:00 PM 02/23/2021   10:07 AM 12/27/2020   10:44 AM 02/18/2020   11:25 AM 06/30/2019    3:42 PM  Fall Risk   Falls in the past year? 0 0 0 1 0  Comment    slipped with socks   Number falls in past yr: 0   0   Injury with Fall? 0   1   Risk for fall due to : Impaired balance/gait;Impaired mobility;Medication side effect Impaired balance/gait;Impaired mobility;Medication side effect  History of fall(s);Impaired balance/gait;Impaired mobility   Follow up Falls evaluation completed;Education provided;Falls prevention discussed Falls evaluation completed;Education provided;Falls prevention discussed  Falls evaluation completed;Education provided;Falls prevention discussed     FALL RISK PREVENTION PERTAINING TO THE HOME:  Any stairs in or around the home? No  If so, are there any without handrails? N/a Home free of loose throw rugs in walkways, pet beds, electrical cords, etc? Yes  Adequate lighting in your home to reduce risk of falls? Yes   ASSISTIVE DEVICES UTILIZED TO PREVENT FALLS:  Life alert? No  Use of a cane, walker or w/c? Yes  Grab bars in the bathroom? Yes  Shower  chair  or bench in shower? Yes  Elevated toilet seat or a handicapped toilet? Yes   TIMED UP AND GO:  Was the test performed? No .      Cognitive Function:        02/17/2019   11:30 AM 05/15/2018    3:17 PM  6CIT Screen  What Year? 4 points 4 points  What month? 3 points 0 points  What time? 0 points 3 points  Count back from 20 4 points 4 points  Months in reverse 4 points 4 points  Repeat phrase 10 points 10 points  Total Score 25 points 25 points    Immunizations Immunization History  Administered Date(s) Administered   Influenza, High Dose Seasonal PF 06/28/2018   Influenza-Unspecified 03/24/2018   Moderna SARS-COV2 Booster Vaccination 07/25/2021   Moderna Sars-Covid-2 Vaccination 12/10/2019, 01/12/2020, 08/20/2020   Pneumococcal Conjugate-13 09/18/2017   Tdap 06/22/2013, 11/03/2019    TDAP status: Up to date  Flu Vaccine status: Due, Education has been provided regarding the importance of this vaccine. Advised may receive this vaccine at local pharmacy or Health Dept. Aware to provide a copy of the vaccination record if obtained from local pharmacy or Health Dept. Verbalized acceptance and understanding.  Pneumococcal vaccine status: Due, Education has been provided regarding the importance of this vaccine. Advised may receive this vaccine at local pharmacy or Health Dept. Aware to provide a copy of the vaccination record if obtained from local pharmacy or Health Dept. Verbalized acceptance and understanding.  Covid-19 vaccine status: Completed vaccines  Qualifies for Shingles Vaccine? Yes   Zostavax completed No   Shingrix Completed?: No.    Education has been provided regarding the importance of this vaccine. Patient has been advised to call insurance company to determine out of pocket expense if they have not yet received this vaccine. Advised may also receive vaccine at local pharmacy or Health Dept. Verbalized acceptance and understanding.  Screening Tests Health  Maintenance  Topic Date Due   Zoster Vaccines- Shingrix (1 of 2) Never done   Pneumonia Vaccine 27+ Years old (2 - PPSV23 or PCV20) 09/18/2018   COVID-19 Vaccine (4 - Moderna risk series) 09/19/2021   INFLUENZA VACCINE  03/06/2022   TETANUS/TDAP  11/02/2029   HPV VACCINES  Aged Out    Health Maintenance  Health Maintenance Due  Topic Date Due   Zoster Vaccines- Shingrix (1 of 2) Never done   Pneumonia Vaccine 21+ Years old (2 - PPSV23 or PCV20) 09/18/2018   COVID-19 Vaccine (4 - Moderna risk series) 09/19/2021   INFLUENZA VACCINE  03/06/2022    Colorectal cancer screening: No longer required.   Lung Cancer Screening: (Low Dose CT Chest recommended if Age 64-80 years, 30 pack-year currently smoking OR have quit w/in 15years.) does not qualify.   Lung Cancer Screening Referral: no  Additional Screening:  Hepatitis C Screening: does not qualify;   Vision Screening: Recommended annual ophthalmology exams for early detection of glaucoma and other disorders of the eye. Is the patient up to date with their annual eye exam?  Yes  Who is the provider or what is the name of the office in which the patient attends annual eye exams? Dr. Elmer Picker If pt is not established with a provider, would they like to be referred to a provider to establish care? No .   Dental Screening: Recommended annual dental exams for proper oral hygiene  Community Resource Referral / Chronic Care Management: CRR required this visit?  No  CCM required this visit?  No      Plan:     I have personally reviewed and noted the following in the patient's chart:   Medical and social history Use of alcohol, tobacco or illicit drugs  Current medications and supplements including opioid prescriptions. Patient is not currently taking opioid prescriptions. Functional ability and status Nutritional status Physical activity Advanced directives List of other physicians Hospitalizations, surgeries, and ER visits  in previous 12 months Vitals Screenings to include cognitive, depression, and falls Referrals and appointments  In addition, I have reviewed and discussed with patient certain preventive protocols, quality metrics, and best practice recommendations. A written personalized care plan for preventive services as well as general preventive health recommendations were provided to patient.     Kellie Simmering, LPN   [id number]   Nurse Notes: 6 CIT not administered. Patient's niece states that he can not comprehend the questions.  Due to this being a virtual visit, the after visit summary with patients personalized plan was offered to patient via mail or my-chart. to pick up at office at next visit

## 2022-04-25 ENCOUNTER — Ambulatory Visit: Payer: Medicare PPO | Admitting: Podiatry

## 2022-05-10 ENCOUNTER — Other Ambulatory Visit: Payer: Self-pay

## 2022-05-14 ENCOUNTER — Other Ambulatory Visit: Payer: Self-pay

## 2022-05-14 MED ORDER — CLOPIDOGREL BISULFATE 75 MG PO TABS: 75.0000 mg | ORAL_TABLET | Freq: Every day | ORAL | 1 refills | Status: DC

## 2022-05-15 ENCOUNTER — Other Ambulatory Visit: Payer: Self-pay

## 2022-05-15 MED ORDER — CLOPIDOGREL BISULFATE 75 MG PO TABS: 75.0000 mg | ORAL_TABLET | Freq: Every day | ORAL | 1 refills | Status: DC

## 2022-05-19 ENCOUNTER — Other Ambulatory Visit: Payer: Self-pay | Admitting: Nurse Practitioner

## 2022-05-19 DIAGNOSIS — J309 Allergic rhinitis, unspecified: Secondary | ICD-10-CM

## 2022-05-29 ENCOUNTER — Other Ambulatory Visit: Payer: Self-pay

## 2022-05-29 MED ORDER — ROSUVASTATIN CALCIUM 20 MG PO TABS: ORAL_TABLET | ORAL | 1 refills | Status: DC

## 2022-07-22 ENCOUNTER — Emergency Department (HOSPITAL_COMMUNITY): Payer: Medicare PPO

## 2022-07-22 ENCOUNTER — Emergency Department (HOSPITAL_COMMUNITY)
Admission: EM | Admit: 2022-07-22 | Discharge: 2022-07-22 | Disposition: A | Discharge status: Home / Self Care | Payer: Medicare PPO | Attending: Emergency Medicine | Admitting: Emergency Medicine

## 2022-07-22 DIAGNOSIS — Z0489 Encounter for examination and observation for other specified reasons: Secondary | ICD-10-CM | POA: Diagnosis not present

## 2022-07-22 DIAGNOSIS — Z7902 Long term (current) use of antithrombotics/antiplatelets: Secondary | ICD-10-CM | POA: Insufficient documentation

## 2022-07-22 DIAGNOSIS — Z8673 Personal history of transient ischemic attack (TIA), and cerebral infarction without residual deficits: Secondary | ICD-10-CM | POA: Insufficient documentation

## 2022-07-22 DIAGNOSIS — S299XXA Unspecified injury of thorax, initial encounter: Secondary | ICD-10-CM | POA: Diagnosis not present

## 2022-07-22 DIAGNOSIS — J329 Chronic sinusitis, unspecified: Secondary | ICD-10-CM | POA: Insufficient documentation

## 2022-07-22 DIAGNOSIS — S0990XA Unspecified injury of head, initial encounter: Secondary | ICD-10-CM | POA: Insufficient documentation

## 2022-07-22 DIAGNOSIS — M47812 Spondylosis without myelopathy or radiculopathy, cervical region: Secondary | ICD-10-CM | POA: Diagnosis not present

## 2022-07-22 DIAGNOSIS — I10 Essential (primary) hypertension: Secondary | ICD-10-CM | POA: Diagnosis not present

## 2022-07-22 DIAGNOSIS — M4312 Spondylolisthesis, cervical region: Secondary | ICD-10-CM | POA: Diagnosis not present

## 2022-07-22 DIAGNOSIS — Y92009 Unspecified place in unspecified non-institutional (private) residence as the place of occurrence of the external cause: Secondary | ICD-10-CM | POA: Diagnosis not present

## 2022-07-22 DIAGNOSIS — R609 Edema, unspecified: Secondary | ICD-10-CM | POA: Diagnosis not present

## 2022-07-22 DIAGNOSIS — W19XXXA Unspecified fall, initial encounter: Secondary | ICD-10-CM | POA: Diagnosis not present

## 2022-07-22 DIAGNOSIS — S0003XA Contusion of scalp, initial encounter: Secondary | ICD-10-CM | POA: Diagnosis not present

## 2022-07-22 DIAGNOSIS — S3993XA Unspecified injury of pelvis, initial encounter: Secondary | ICD-10-CM | POA: Diagnosis not present

## 2022-07-22 LAB — BASIC METABOLIC PANEL
Anion gap: 10 (ref 5–15)
BUN: 11 mg/dL (ref 8–23)
CO2: 23 mmol/L (ref 22–32)
Calcium: 9.3 mg/dL (ref 8.9–10.3)
Chloride: 106 mmol/L (ref 98–111)
Creatinine, Ser: 1.18 mg/dL (ref 0.61–1.24)
GFR, Estimated: >60 mL/min (ref >60)
Glucose, Bld: 132 mg/dL — ABNORMAL HIGH (ref 70–99)
Potassium: 3.4 mmol/L — ABNORMAL LOW (ref 3.5–5.1)
Sodium: 139 mmol/L (ref 135–145)

## 2022-07-22 LAB — CBC WITH DIFFERENTIAL/PLATELET
Abs Immature Granulocytes: 0.09 10*3/uL — ABNORMAL HIGH (ref 0.00–0.07)
Basophils Absolute: 0.1 10*3/uL (ref 0.0–0.1)
Basophils Relative: 1 %
Eosinophils Absolute: 0.2 10*3/uL (ref 0.0–0.5)
Eosinophils Relative: 3 %
HCT: 40.4 % (ref 39.0–52.0)
Hemoglobin: 13.7 g/dL (ref 13.0–17.0)
Immature Granulocytes: 1 %
Lymphocytes Relative: 21 %
Lymphs Abs: 1.5 10*3/uL (ref 0.7–4.0)
MCH: 32.3 pg (ref 26.0–34.0)
MCHC: 33.9 g/dL (ref 30.0–36.0)
MCV: 95.3 fL (ref 80.0–100.0)
Monocytes Absolute: 0.7 10*3/uL (ref 0.1–1.0)
Monocytes Relative: 10 %
Neutro Abs: 4.5 10*3/uL (ref 1.7–7.7)
Neutrophils Relative %: 64 %
Platelets: 238 10*3/uL (ref 150–400)
RBC: 4.24 MIL/uL (ref 4.22–5.81)
RDW: 16.8 % — ABNORMAL HIGH (ref 11.5–15.5)
WBC: 7 10*3/uL (ref 4.0–10.5)
nRBC: 0 % (ref 0.0–0.2)

## 2022-07-22 LAB — PROTIME-INR
INR: 1.2 (ref 0.8–1.2)
Prothrombin Time: 15.2 seconds (ref 11.4–15.2)

## 2022-07-22 NOTE — Progress Notes (Signed)
Orthopedic Tech Progress Note Patient Details:  [name] [birth year] [id number]  Patient ID: [name], male   DOB: Sep 15, 1935, 86 y.o.   MRN: [id number] Level II; not currently needed. Darleen Crocker 07/22/2022, 10:33 AM

## 2022-07-22 NOTE — ED Provider Notes (Signed)
[facility] EMERGENCY DEPARTMENT Provider Note   CSN: [id number] Arrival date & time: 07/22/22  1028     History Chief Complaint  Patient presents with   Fall    HPI [name] is a 86 y.o. male presenting for chief complaint of billable fall.  He is a 86 year old male with an extensive medical history including stroke on Plavix.  He is sitting on a stool where the leg broke and he fell over the side hitting the back of his head.  He denies loss of consciousness.  He was awake when his family came immediately.  He denies fevers or chills, nausea or vomiting, syncope or shortness of breath..   Patient's recorded medical, surgical, social, medication list and allergies were reviewed in the Snapshot window as part of the initial history.   Review of Systems   Review of Systems  Constitutional:  Negative for chills and fever.  HENT:  Negative for ear pain and sore throat.   Eyes:  Negative for pain and visual disturbance.  Respiratory:  Negative for cough and shortness of breath.   Cardiovascular:  Negative for chest pain and palpitations.  Gastrointestinal:  Negative for abdominal pain and vomiting.  Genitourinary:  Negative for dysuria and hematuria.  Musculoskeletal:  Negative for arthralgias and back pain.  Skin:  Negative for color change and rash.  Neurological:  Negative for seizures and syncope.  All other systems reviewed and are negative.   Physical Exam Updated Vital Signs BP (!) 169/90   Pulse 78   Temp 97.8 F (36.6 C) (Oral)   Resp 19   Ht 6' (1.829 m)   Wt 88.5 kg   SpO2 100%   BMI 26.45 kg/m  Physical Exam Vitals and nursing note reviewed.  Constitutional:      General: He is not in acute distress.    Appearance: He is well-developed.  HENT:     Head: Normocephalic and atraumatic.  Eyes:     Conjunctiva/sclera: Conjunctivae normal.  Cardiovascular:     Rate and Rhythm: Normal rate and regular rhythm.     Heart sounds: No  murmur heard. Pulmonary:     Effort: Pulmonary effort is normal. No respiratory distress.     Breath sounds: Normal breath sounds.  Abdominal:     Palpations: Abdomen is soft.     Tenderness: There is no abdominal tenderness.  Musculoskeletal:        General: No swelling.     Cervical back: Neck supple.  Skin:    General: Skin is warm and dry.     Capillary Refill: Capillary refill takes less than 2 seconds.  Neurological:     Mental Status: He is alert.  Psychiatric:        Mood and Affect: Mood normal.      ED Course/ Medical Decision Making/ A&P    Procedures Procedures   Medications Ordered in ED Medications - No data to display Medical Decision Making:   [name] is a 86 y.o. male who presented to the ED today with a fall at their home detailed above. They are not on a blood thinner.  They are on Plavix. Patient's presentation is complicated by their history of fall.   Complete initial physical exam performed, notably the patient  was hemodynamically stable  in no acute distress. No obvious deformities or injuries appreciated on extensive physical exam including active range of motion of all joints.     Reviewed and confirmed  nursing documentation for past medical history, family history, social history.    Initial Assessment/Plan:   This is a patient presenting with a moderate blunt mechanism trauma.  As such, I have considered intracranial injuries including intracranial hemorrhage, intrathoracic injuries including blunt myocardial or blunt lung injury, blunt abdominal injuries including aortic dissection, bladder injury, spleen injury, liver injury and I have considered orthopedic injuries including extremity or spinal injury. This is most consistent with an acute life/limb threatening illness complicated by underlying chronic conditions.  With the patient's presentation of moderate mechanism trauma but an otherwise reassuring exam, patient warrants targeted  evaluation for potential traumatic injuries. Will proceed with targeted evaluation for potential injuries. Will proceed with CT Head, Cervical Spine CT, and Chest/Pelvis XR.    Images reviewed and agree with radiology interpretation.  CT CERVICAL SPINE WO CONTRAST  Result Date: 07/22/2022 CLINICAL DATA:  Trauma, fall EXAM: CT CERVICAL SPINE WITHOUT CONTRAST TECHNIQUE: Multidetector CT imaging of the cervical spine was performed without intravenous contrast. Multiplanar CT image reconstructions were also generated. RADIATION DOSE REDUCTION: This exam was performed according to the departmental dose-optimization program which includes automated exposure control, adjustment of the mA and/or kV according to patient size and/or use of iterative reconstruction technique. COMPARISON:  11/03/2019 FINDINGS: Alignment: There is minimal retrolisthesis at C5-C6 level which has not changed. There is reversal of lordosis. Skull base and vertebrae: No recent fracture is seen. Degenerative changes are noted at multiple levels, more so at C5-C6 and C6-C7 levels. There is partial fusion of bodies of C3 and C4 vertebrae. Soft tissues and spinal canal: There is extrinsic pressure over the ventral margin of thecal sac causing spinal stenosis at C5-C6 and C6-C7 levels. Disc levels: There is encroachment of neural foramina by bony spurs and facet hypertrophy from C2 to T1 levels, more severe at C5-C6 and C6-C7 levels. Upper chest: Unremarkable. Other: Scattered arterial calcifications are seen. IMPRESSION: No recent fracture is seen. Cervical spondylosis with encroachment of neural foramina from C2-T1 levels. Electronically Signed   By: Ernie Avena M.D.   On: 07/22/2022 11:19   CT HEAD WO CONTRAST ( )  Result Date: 07/22/2022 CLINICAL DATA:  Trauma, fall EXAM: CT HEAD WITHOUT CONTRAST TECHNIQUE: Contiguous axial images were obtained from the base of the skull through the vertex without intravenous contrast. RADIATION  DOSE REDUCTION: This exam was performed according to the departmental dose-optimization program which includes automated exposure control, adjustment of the mA and/or kV according to patient size and/or use of iterative reconstruction technique. COMPARISON:  11/03/2019 FINDINGS: Brain: No acute intracranial findings are seen. There are no signs of bleeding within the cranium. Cortical sulci are prominent. Ventricles are not dilated. There is decreased density in periventricular white matter. Vascular: Arterial calcifications are seen. Skull: No fracture is seen in calvarium. There is subcutaneous contusion/hematoma in the right posterior parietal scalp. Sinuses/Orbits: There is mucosal thickening in sphenoid sinus. Other: None. IMPRESSION: No acute intracranial findings are seen in noncontrast CT brain. There is subcutaneous contusion/hematoma in the right posterior parietal scalp. No fracture is seen in calvarium. Chronic sinusitis. Electronically Signed   By: Ernie Avena M.D.   On: 07/22/2022 11:13   DG Chest Portable 1 View  Result Date: 07/22/2022 CLINICAL DATA:  Trauma EXAM: PORTABLE CHEST 1 VIEW COMPARISON:  Previous studies including the examination of 03/04/2020 FINDINGS: Transverse diameter of heart is increased. There is evidence of previous coronary bypass surgery. There is a metallic clamp in the region of left atrial appendage. Lung fields  are clear of any pulmonary edema or focal pulmonary consolidation. There is poor inspiration. There is no pleural effusion or pneumothorax. There is decreased distance between humeral head and acromion in right shoulder suggesting chronic tear of rotator cuff. IMPRESSION: Poor inspiration. There are no signs of pulmonary edema or focal pulmonary consolidation. Electronically Signed   By: Elmer Picker M.D.   On: 07/22/2022 11:00   DG Pelvis Portable  Result Date: 07/22/2022 CLINICAL DATA:  Fall.  Trauma. EXAM: PORTABLE PELVIS 1-2 VIEWS  COMPARISON:  None Available. FINDINGS: Evaluation of the hips is limited due to internal rotation and patient positioning. No hip fractures are identified. No pelvic bone fractures are noted. Degenerative changes are identified in the lower lumbar spine. IMPRESSION: Limited study as above. No fractures are noted. Dedicated images of the hips could be performed for better evaluation if there is clinical concern. Electronically Signed   By: Dorise Bullion III M.D.   On: 07/22/2022 10:59    Final Reassessment and Plan:   Patient observed in emergency department for 2 hours.  Symptoms stable, no headache.  No growth of soft tissue deformities on posterior scalp or neck.  Likely developing small hematomas though no signs of change during the duration of observation here.  Discussed delayed bleeding with patient and patient's daughter who will plan to bring him back if he has any further decompensation or symptoms.  Given well.  He is stable for outpatient care management at this time.  Disposition:  I have considered need for hospitalization, however, considering all of the above, I believe this patient is stable for discharge at this time.  Patient/family educated about specific return precautions for given chief complaint and symptoms.  Patient/family educated about follow-up with PCP  Patient/family expressed understanding of return precautions and need for follow-up. Patient spoken to regarding all imaging and laboratory results and appropriate follow up for these results. All education provided in verbal form with additional information in written form. Time was allowed for answering of patient questions. Patient discharged.    Emergency Department Medication Summary:   Medications - No data to display         Clinical Impression:  1. Fall, initial encounter      Discharge   Final Clinical Impression(s) / ED Diagnoses Final diagnoses:  Fall, initial encounter    Rx / DC Orders ED  Discharge Orders     None         Tretha Sciara, MD 07/22/22 1217

## 2022-07-22 NOTE — ED Triage Notes (Incomplete)
Pt BIB [facility] from home, pt was sitting on a stool fell out as his leg caught under the stool. Pt fell out . Denied hitting head. On plavix. A&O X4. No apparent injuries noted. No LOC.   BP 170/70 HR 98 RR 18  O2 95%

## 2022-07-22 NOTE — ED Notes (Signed)
Trauma Response Nurse Documentation   [name] is a 86 y.o. male arriving to [facility] ED via EMS  On clopidogrel 75 mg daily. Trauma was activated as a Level 2 by ED Charge RN based on the following trauma criteria Elderly patients > 65 with head trauma on anti-coagulation (excluding ASA). Trauma team at the bedside on patient arrival.   Patient cleared for CT by Dr. Doran Durand. Pt transported to CT with trauma response nurse present to monitor. RN remained with the patient throughout their absence from the department for clinical observation.   GCS 15.  History   Past Medical History:  Diagnosis Date   Arthritis    Bundle branch block    Hypercholesteremia    Hyperlipidemia 2001   Hypertension 2001   Hypertension    Peripheral vascular disease (HCC)    Stroke [facility])    Stroke (HCC)    2001 - R side deficits     Past Surgical History:  Procedure Laterality Date   ABDOMINAL AORTOGRAM N/A 11/18/2019   Procedure: ABDOMINAL AORTOGRAM;  Surgeon: Corky Crafts, MD;  Location: [facility] INVASIVE CV LAB;  Service: Cardiovascular;  Laterality: N/A;   CORONARY ARTERY BYPASS GRAFT N/A 11/20/2019   Procedure: CORONARY ARTERY BYPASS GRAFTING (CABG), ON PUMP, TIMES THREE, USING LEFT INTERNAL MAMMARY ARTERY AND ENDOSCOPICALLY HARVESTED RIGHT GREATER SAPHENOUS VEIN;  Surgeon: Linden Dolin, MD;  Location: [facility] OR;  Service: Open Heart Surgery;  Laterality: N/A;   LEFT HEART CATH AND CORONARY ANGIOGRAPHY N/A 11/18/2019   Procedure: LEFT HEART CATH AND CORONARY ANGIOGRAPHY;  Surgeon: Corky Crafts, MD;  Location[facility] INVASIVE CV LAB;  Service: Cardiovascular;  Laterality: N/A;   TEE WITHOUT CARDIOVERSION N/A 11/20/2019   Procedure: TRANSESOPHAGEAL ECHOCARDIOGRAM (TEE);  Surgeon: Linden Dolin, MD;  Location: [facility] OR;  Service: Open Heart Surgery;  Laterality: N/A;      Initial Focused Assessment (If applicable, or please see trauma documentation): - GCS 15 - Very hard of  hearing - C/O no pain - Small hematoma to top of head - No other external trauma noted - 18G PIV to L FA - PERRLA 3's brisk  CT's Completed:   CT Head and CT C-Spine   Interventions:  - Trauma labs - Undressed pt to assess for any trauma - none noted other than small hematoma to top of head - CXR - Pelvic XR - CT head and c-spine  Plan for disposition:  Discharge home   Consults completed:  none at 1100.  Event Summary: Pt was on a stool when one of the legs gave out underneath him.  He had a fall and struck his head.  No LOC.  C/O no pain.  Pt on plavix and has had previous strokes and a heart attack in 2001.   Bedside handoff with ED RN Chloe.    Janora Norlander  Trauma Response RN  Please call TRN at [phone] for further assistance.

## 2022-07-23 ENCOUNTER — Telehealth: Payer: Self-pay

## 2022-07-23 NOTE — Telephone Encounter (Signed)
Transition Care Management Unsuccessful Follow-up Telephone Call  Date of discharge and from where:  07/22/2022    Attempts:  1st Attempt  Reason for unsuccessful TCM follow-up call:  Left voice message

## 2022-07-25 ENCOUNTER — Telehealth: Payer: Self-pay

## 2022-07-25 NOTE — Telephone Encounter (Signed)
Transition Care Management Unsuccessful Follow-up Telephone Call  Date of discharge and from where:  07/22/2022  Attempts:  2nd attempt  Reason for unsuccessful TCM follow-up call:  Left voice message

## 2022-07-26 IMAGING — MR MR HUMERUS*L* WO/W CM
5 of 10 series · 23 of 40 positions shown · IV contrast (multihance)
Comparison: None.

CLINICAL DATA: Soft tissue bulge along the arm about 3 months.

EXAM:
MRI OF THE LEFT HUMERUS WITHOUT AND WITH CONTRAST
TECHNIQUE: Multiplanar, multisequence MR imaging of the left humerus was
performed before and after the administration of intravenous
contrast.
CONTRAST:  20mL MULTIHANCE GADOBENATE DIMEGLUMINE 529 MG/ML IV SOLN

[Series 6: T1 · axial · 5.0mm · 0.39mm/px · z∈[-161,+145]mm · 7 of 54 slices shown (1 of 3)]
[im 1/54]
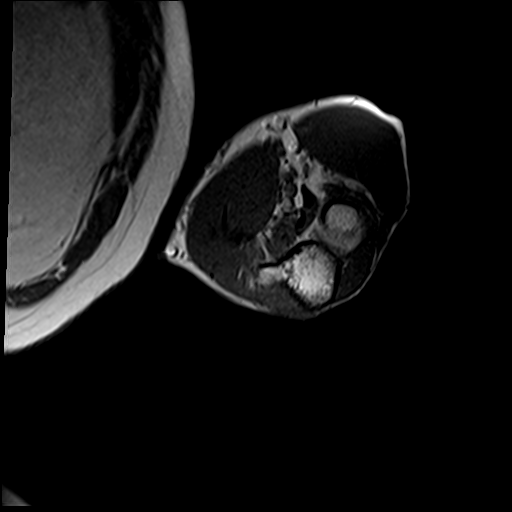
[im 9/54]
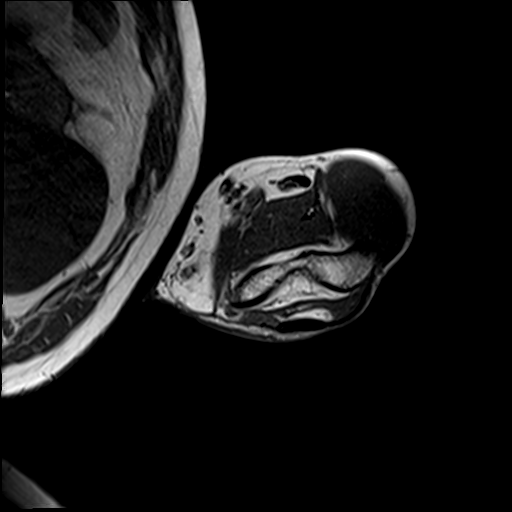
[im 18/54]
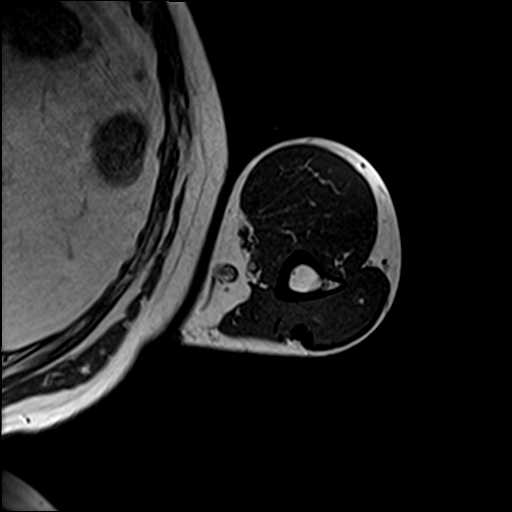
[im 27/54]
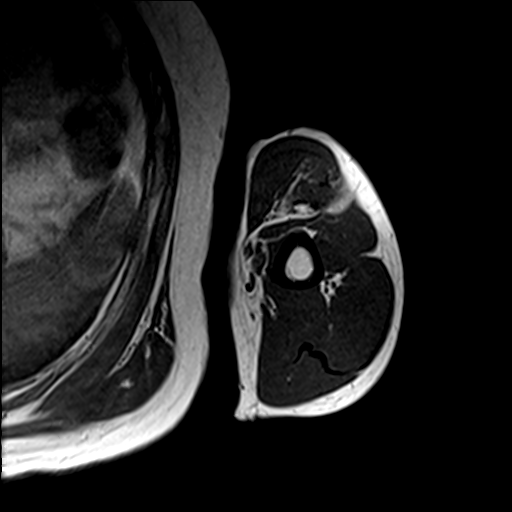
[im 36/54]
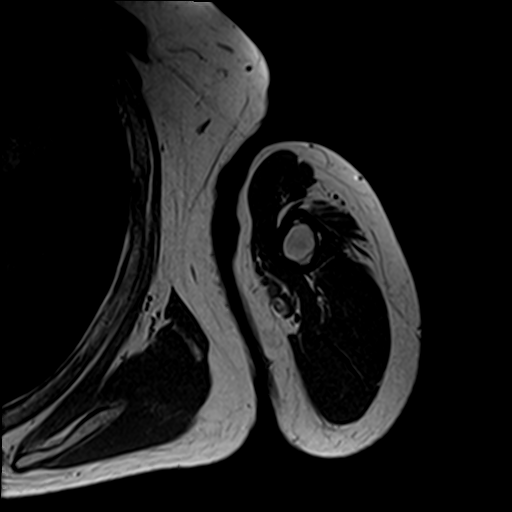
[im 45/54]
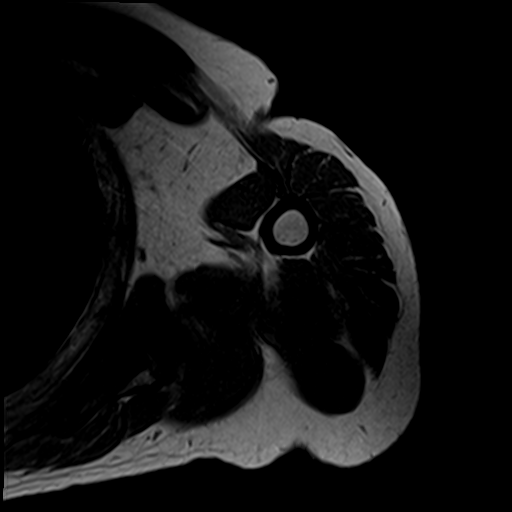
[im 54/54]
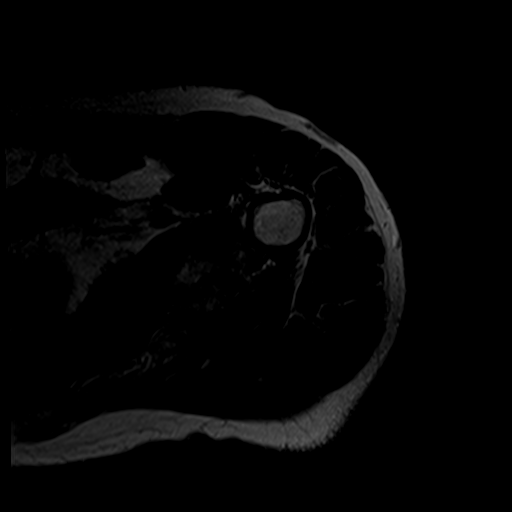

[Series 7: T2 fat-sat · axial · 5.0mm · 0.39mm/px · z∈[-161,+145]mm · 6 of 54 slices shown]
[im 1/54]
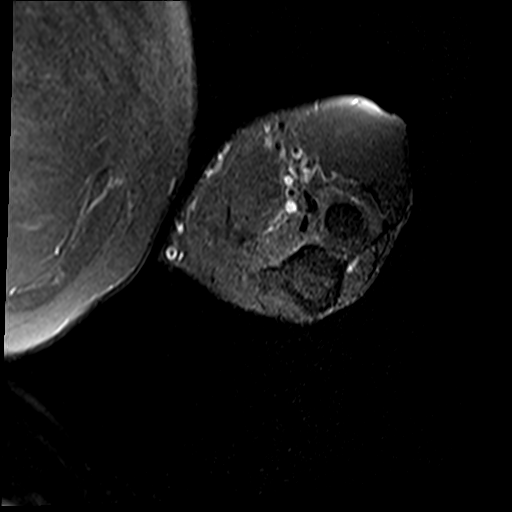
[im 11/54]
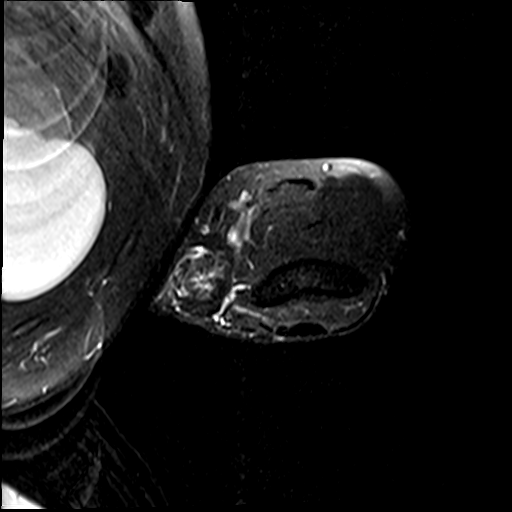
[im 22/54]
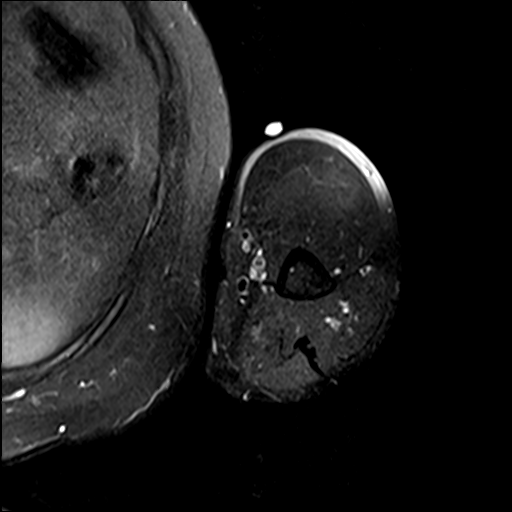
[im 32/54]
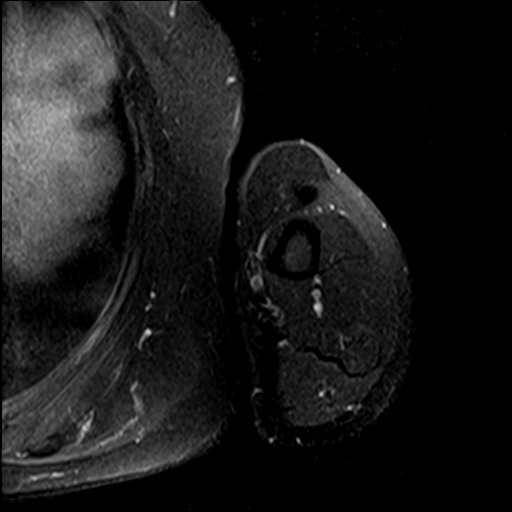
[im 43/54]
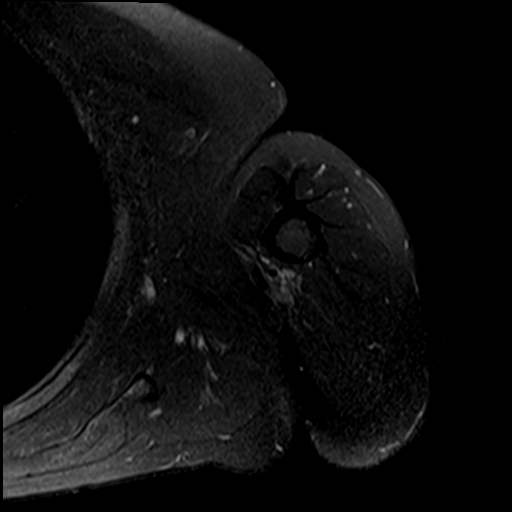
[im 54/54]
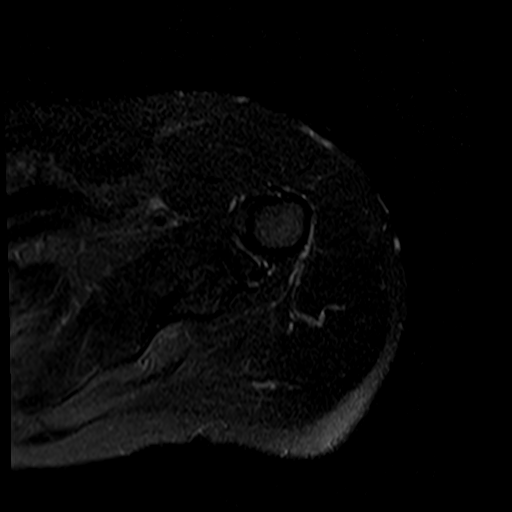

[Series 8: T1 · coronal · 4.0mm · 1.56mm/px · 3 of 27 slices shown (2 of 3)]
[im 1/27]
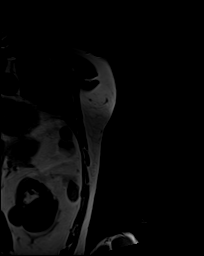
[im 14/27]
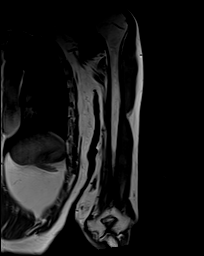
[im 27/27]
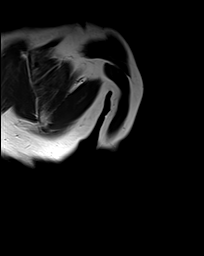

[Series 10: T1 · sagittal · 4.0mm · 1.56mm/px · 2 of 21 slices shown (3 of 3)]
[im 1/21]
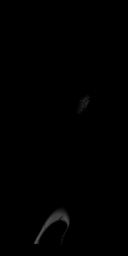
[im 21/21]
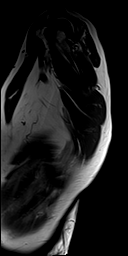

[Series 12: T1 fat-sat · axial · 5.0mm · 0.39mm/px · z∈[-161,+81]mm · 5 of 54 slices shown]
[im 1/54]
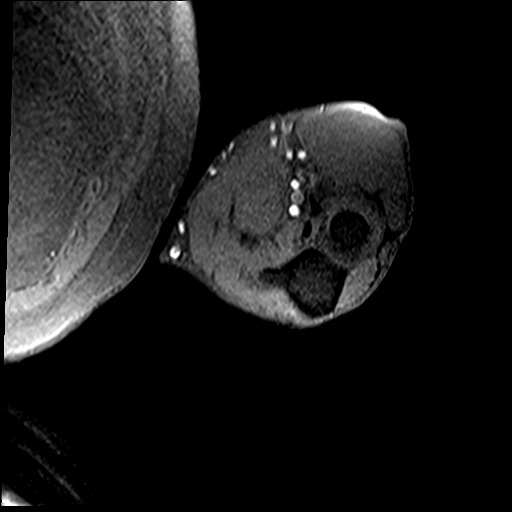
[im 11/54]
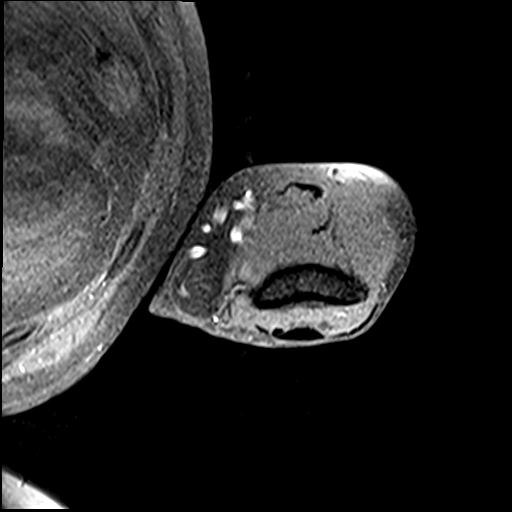
[im 22/54]
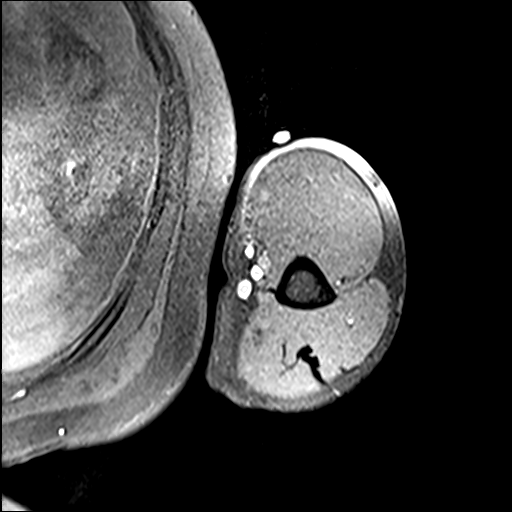
[im 32/54]
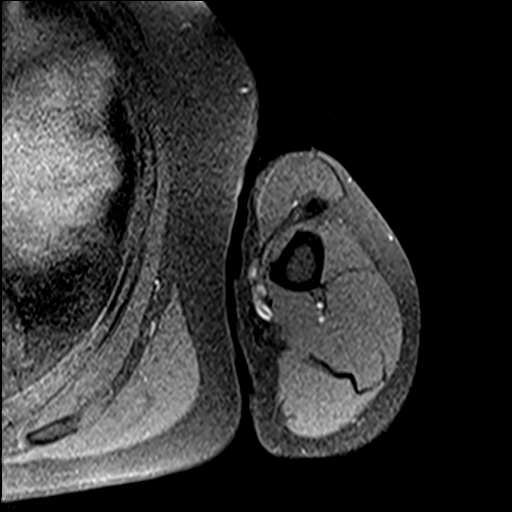
[im 43/54]
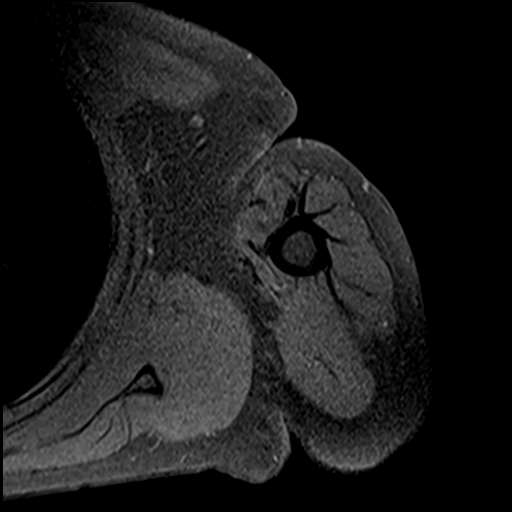

[23 of 40 positions shown; findings below may reference images not displayed]

FINDINGS: Bones/Joint/Cartilage

No marrow signal abnormality. No fracture or dislocation. Normal
alignment. No joint effusion. No erosive changes. No periostitis.

Muscles and Tendons
Complete tear of the supraspinatus and infraspinatus tendons with
4.7 cm of retraction. Mild atrophy of the supraspinatus muscle.

Soft tissue
No fluid collection or hematoma.  No soft tissue mass.
IMPRESSION: 1. No soft tissue mass, fluid collection or hematoma of the left
arm.
2. Complete tear of the supraspinatus and infraspinatus tendons with
4.7 cm of retraction.

## 2022-08-20 ENCOUNTER — Other Ambulatory Visit: Payer: Self-pay | Admitting: Nurse Practitioner

## 2022-08-20 DIAGNOSIS — J309 Allergic rhinitis, unspecified: Secondary | ICD-10-CM

## 2022-09-03 ENCOUNTER — Encounter: Payer: Self-pay | Admitting: Nurse Practitioner

## 2022-09-03 ENCOUNTER — Ambulatory Visit: Payer: Medicare PPO | Admitting: Nurse Practitioner

## 2022-09-03 VITALS — BP 138/70 | HR 56 | Temp 98.2°F | Ht 72.0 in | Wt 182.0 lb

## 2022-09-03 DIAGNOSIS — G8111 Spastic hemiplegia affecting right dominant side: Secondary | ICD-10-CM

## 2022-09-03 DIAGNOSIS — I10 Essential (primary) hypertension: Secondary | ICD-10-CM | POA: Diagnosis not present

## 2022-09-03 DIAGNOSIS — J309 Allergic rhinitis, unspecified: Secondary | ICD-10-CM

## 2022-09-03 DIAGNOSIS — E782 Mixed hyperlipidemia: Secondary | ICD-10-CM | POA: Diagnosis not present

## 2022-09-03 DIAGNOSIS — R1900 Intra-abdominal and pelvic swelling, mass and lump, unspecified site: Secondary | ICD-10-CM

## 2022-09-03 DIAGNOSIS — R7303 Prediabetes: Secondary | ICD-10-CM

## 2022-09-03 MED ORDER — MONTELUKAST SODIUM 10 MG PO TABS: ORAL_TABLET | ORAL | 1 refills | Status: DC

## 2022-09-03 NOTE — Progress Notes (Signed)
I,Tianna Badgett,acting as a Education administrator for [employer], FNP.,have documented all relevant documentation on the behalf of William Brine, FNP,as directed by  William Brine, FNP while in the presence of [name], [place].  Subjective:     Patient ID: [name] , male    DOB: 1936/04/21 , 87 y.o.   MRN: [id number]   No chief complaint on file.   HPI  Patient is here for HTN follow up. He is accompanied by his niece [name]. Reports he is doing well. He is working to get hearing aids, thought to have congenital hearing loss.   Hypertension This is a chronic problem. The current episode started more than 1 year ago. The problem is unchanged. The problem is controlled. Pertinent negatives include no anxiety, blurred vision, chest pain, headaches, malaise/fatigue, palpitations, peripheral edema or shortness of breath. There are no associated agents to hypertension. Risk factors for coronary artery disease include sedentary lifestyle. There is no history of kidney disease. There is no history of chronic renal disease.     Past Medical History:  Diagnosis Date   Arthritis    Bundle branch block    Hypercholesteremia    Hyperlipidemia 2001   Hypertension 2001   Hypertension    Peripheral vascular disease ([place])    Stroke ([place])    Stroke ([place])    2001 - R side deficits     Family History  Problem Relation Age of Onset   Stroke Mother    Stroke Father    Heart attack Father    Cancer Brother      Current Outpatient Medications:    acetaminophen (TYLENOL) 325 MG tablet, Take 2 tablets (650 mg total) by mouth every 4 (four) hours as needed for headache or mild pain., Disp:  , Rfl:    aspirin EC 81 MG tablet, Take 81 mg by mouth daily., Disp: , Rfl:    cetirizine (ZYRTEC) 10 MG tablet, Take 10 mg by mouth as needed for allergies., Disp: , Rfl:    Cholecalciferol (VITAMIN D) 50 MCG (2000 UT) tablet, Take 2,000 Units by mouth daily., Disp: , Rfl:    clopidogrel (PLAVIX) 75 MG tablet, Take  1 tablet (75 mg total) by mouth daily., Disp: 90 tablet, Rfl: 1   diclofenac sodium (VOLTAREN) 1 % GEL, Apply 2 g topically 4 (four) times daily. Rub into affected area of foot 2 to 4 times daily (Patient not taking: Reported on 03/21/2022), Disp: 100 g, Rfl: 2   fluticasone (FLONASE) 50 MCG/ACT nasal spray, Place 2 sprays into both nostrils daily., Disp: 16 g, Rfl: 2   montelukast (SINGULAIR) 10 MG tablet, Take 1 tablet by mouth daily, Disp: 90 tablet, Rfl: 1   polyethylene glycol (MIRALAX / GLYCOLAX) 17 g packet, Take 17 g by mouth daily. (Patient taking differently: Take 17 g by mouth daily as needed.), Disp: 14 each, Rfl: 0   rosuvastatin (CRESTOR) 20 MG tablet, TAKE 1 TABLET(20 MG) BY MOUTH DAILY, Disp: 90 tablet, Rfl: 1   tamsulosin (FLOMAX) 0.4 MG CAPS capsule, Take 1 capsule (0.4 mg total) by mouth daily., Disp: 90 capsule, Rfl: 1   zinc gluconate 50 MG tablet, Take 50 mg by mouth daily., Disp: , Rfl:    No Known Allergies   Review of Systems  Constitutional: Negative.  Negative for malaise/fatigue.  Eyes:  Negative for blurred vision.  Respiratory: Negative.  Negative for shortness of breath and wheezing.   Cardiovascular: Negative.  Negative for chest pain and palpitations.  Gastrointestinal: Negative.   Neurological: Negative.  Negative for headaches.  Psychiatric/Behavioral: Negative.       Today's Vitals   09/03/22 1532  BP: 138/70  Pulse: (!) 56  Temp: 98.2 F (36.8 C)  TempSrc: Oral  Weight: 182 lb (82.6 kg)  Height: 6' (1.829 m)   Body mass index is 24.68 kg/m.   Objective:  Physical Exam Vitals reviewed.         Assessment And Plan:     1. Essential hypertension Comments: Blood pressure is fairly controlled, encouraged to focus on lifestyle modifications - AMB Referral to Chronic Care Management Services  2. Prediabetes Comments: Stable, no current medications - Hemoglobin A1c - AMB Referral to Chronic Care Management Services  3. Mixed  hyperlipidemia Comments: Stable cholesterol levels. Continue current medications - Lipid panel - AMB Referral to Chronic Care Management Services  4. Allergic rhinitis, unspecified seasonality, unspecified trigger - montelukast (SINGULAIR) 10 MG tablet; Take 1 tablet by mouth daily  Dispense: 90 tablet; Refill: 1  5. Abdominal wall bulge Comments: Has a bulging when he bends over, abdomen is soft, will send for CT scan of abdomen to evaluate for possible hernia. - CT Abdomen Pelvis Wo Contrast; Future  6. Right spastic hemiplegia (HCC) - AMB Referral to Chronic Care Management Services     Patient was given opportunity to ask questions. Patient verbalized understanding of the plan and was able to repeat key elements of the plan. All questions were answered to their satisfaction.  William Brine, FNP   I, William Brine, FNP, have reviewed all documentation for this visit. The documentation on 09/03/22 for the exam, diagnosis, procedures, and orders are all accurate and complete.   IF YOU HAVE BEEN REFERRED TO A SPECIALIST, IT MAY TAKE 1-2 WEEKS TO SCHEDULE/PROCESS THE REFERRAL. IF YOU HAVE NOT HEARD FROM US/SPECIALIST IN TWO WEEKS, PLEASE GIVE [place] A CALL AT [phone].   THE PATIENT IS ENCOURAGED TO PRACTICE SOCIAL DISTANCING DUE TO THE COVID-19 PANDEMIC.

## 2022-09-03 NOTE — Patient Instructions (Signed)
Hypertension, Adult High blood pressure (hypertension) is when the force of blood pumping through the arteries is too strong. The arteries are the blood vessels that carry blood from the heart throughout the body. Hypertension forces the heart to work harder to pump blood and may cause arteries to become narrow or stiff. Untreated or uncontrolled hypertension can lead to a heart attack, heart failure, a stroke, kidney disease, and other problems. A blood pressure reading consists of a higher number over a lower number. Ideally, your blood pressure should be below 120/80. The first ("top") number is called the systolic pressure. It is a measure of the pressure in your arteries as your heart beats. The second ("bottom") number is called the diastolic pressure. It is a measure of the pressure in your arteries as the heart relaxes. What are the causes? The exact cause of this condition is not known. There are some conditions that result in high blood pressure. What increases the risk? Certain factors may make you more likely to develop high blood pressure. Some of these risk factors are under your control, including: Smoking. Not getting enough exercise or physical activity. Being overweight. Having too much fat, sugar, calories, or salt (sodium) in your diet. Drinking too much alcohol. Other risk factors include: Having a personal history of heart disease, diabetes, high cholesterol, or kidney disease. Stress. Having a family history of high blood pressure and high cholesterol. Having obstructive sleep apnea. Age. The risk increases with age. What are the signs or symptoms? High blood pressure may not cause symptoms. Very high blood pressure (hypertensive crisis) may cause: Headache. Fast or irregular heartbeats (palpitations). Shortness of breath. Nosebleed. Nausea and vomiting. Vision changes. Severe chest pain, dizziness, and seizures. How is this diagnosed? This condition is diagnosed by  measuring your blood pressure while you are seated, with your arm resting on a flat surface, your legs uncrossed, and your feet flat on the floor. The cuff of the blood pressure monitor will be placed directly against the skin of your upper arm at the level of your heart. Blood pressure should be measured at least twice using the same arm. Certain conditions can cause a difference in blood pressure between your right and left arms. If you have a high blood pressure reading during one visit or you have normal blood pressure with other risk factors, you may be asked to: Return on a different day to have your blood pressure checked again. Monitor your blood pressure at home for 1 week or longer. If you are diagnosed with hypertension, you may have other blood or imaging tests to help your health care provider understand your overall risk for other conditions. How is this treated? This condition is treated by making healthy lifestyle changes, such as eating healthy foods, exercising more, and reducing your alcohol intake. You may be referred for counseling on a healthy diet and physical activity. Your health care provider may prescribe medicine if lifestyle changes are not enough to get your blood pressure under control and if: Your systolic blood pressure is above 130. Your diastolic blood pressure is above 80. Your personal target blood pressure may vary depending on your medical conditions, your age, and other factors. Follow these instructions at home: Eating and drinking  Eat a diet that is high in fiber and potassium, and low in sodium, added sugar, and fat. An example of this eating plan is called the DASH diet. DASH stands for Dietary Approaches to Stop Hypertension. To eat this way: Eat   plenty of fresh fruits and vegetables. Try to fill one half of your plate at each meal with fruits and vegetables. Eat whole grains, such as whole-wheat pasta, brown rice, or whole-grain bread. Fill about one  fourth of your plate with whole grains. Eat or drink low-fat dairy products, such as skim milk or low-fat yogurt. Avoid fatty cuts of meat, processed or cured meats, and poultry with skin. Fill about one fourth of your plate with lean proteins, such as fish, chicken without skin, beans, eggs, or tofu. Avoid pre-made and processed foods. These tend to be higher in sodium, added sugar, and fat. Reduce your daily sodium intake. Many people with hypertension should eat less than 1,500 mg of sodium a day. Do not drink alcohol if: Your health care provider tells you not to drink. You are pregnant, may be pregnant, or are planning to become pregnant. If you drink alcohol: Limit how much you have to: 0-1 drink a day for women. 0-2 drinks a day for men. Know how much alcohol is in your drink. In the [place] one drink equals one 12 oz bottle of beer (355 mL), one 5 oz glass of wine (148 mL), or one 1 oz glass of hard liquor (44 mL). Lifestyle  Work with your health care provider to maintain a healthy body weight or to lose weight. Ask what an ideal weight is for you. Get at least 30 minutes of exercise that causes your heart to beat faster (aerobic exercise) most days of the week. Activities may include walking, swimming, or biking. Include exercise to strengthen your muscles (resistance exercise), such as Pilates or lifting weights, as part of your weekly exercise routine. Try to do these types of exercises for 30 minutes at least 3 days a week. Do not use any products that contain nicotine or tobacco. These products include cigarettes, chewing tobacco, and vaping devices, such as e-cigarettes. If you need help quitting, ask your health care provider. Monitor your blood pressure at home as told by your health care provider. Keep all follow-up visits. This is important. Medicines Take over-the-counter and prescription medicines only as told by your health care provider. Follow directions carefully. Blood  pressure medicines must be taken as prescribed. Do not skip doses of blood pressure medicine. Doing this puts you at risk for problems and can make the medicine less effective. Ask your health care provider about side effects or reactions to medicines that you should watch for. Contact a health care provider if you: Think you are having a reaction to a medicine you are taking. Have headaches that keep coming back (recurring). Feel dizzy. Have swelling in your ankles. Have trouble with your vision. Get help right away if you: Develop a severe headache or confusion. Have unusual weakness or numbness. Feel faint. Have severe pain in your chest or abdomen. Vomit repeatedly. Have trouble breathing. These symptoms may be an emergency. Get help right away. Call 911. Do not wait to see if the symptoms will go away. Do not drive yourself to the hospital. Summary Hypertension is when the force of blood pumping through your arteries is too strong. If this condition is not controlled, it may put you at risk for serious complications. Your personal target blood pressure may vary depending on your medical conditions, your age, and other factors. For most people, a normal blood pressure is less than 120/80. Hypertension is treated with lifestyle changes, medicines, or a combination of both. Lifestyle changes include losing weight, eating a healthy,   low-sodium diet, exercising more, and limiting alcohol. This information is not intended to replace advice given to you by your health care provider. Make sure you discuss any questions you have with your health care provider. Document Revised: 05/30/2021 Document Reviewed: 05/30/2021 Elsevier Patient Education  2023 Elsevier Inc.  

## 2022-09-04 LAB — LIPID PANEL
Chol/HDL Ratio: 2.7 ratio (ref 0.0–5.0)
Cholesterol, Total: 152 mg/dL (ref 100–199)
HDL: 57 mg/dL (ref >39)
LDL Chol Calc (NIH): 80 mg/dL (ref 0–99)
Triglycerides: 79 mg/dL (ref 0–149)
VLDL Cholesterol Cal: 15 mg/dL (ref 5–40)

## 2022-09-04 LAB — HEMOGLOBIN A1C
Est. average glucose Bld gHb Est-mCnc: 123 mg/dL
Hgb A1c MFr Bld: 5.9 % — ABNORMAL HIGH (ref 4.8–5.6)

## 2022-09-10 ENCOUNTER — Telehealth: Payer: Self-pay

## 2022-09-10 NOTE — Progress Notes (Signed)
  Chronic Care Management Note  09/10/2022 Name: [name] MRN: [id number] DOB: Jun 03, 1936  [name] is a 87 y.o. year old male who is a primary care patient of [name], [place] and is actively engaged with the Chronic Care Management team. I reached out to [name] by phone today to assist with scheduling an initial visit with the RN Case Manager  Follow up plan: Unsuccessful telephone outreach attempt made. A HIPAA compliant phone message was left for the patient providing contact information and requesting a return call.  If patient returns call to provider office, please advise to call [place] at [phone].  Ramiro Harvest

## 2022-09-12 ENCOUNTER — Telehealth: Payer: Self-pay

## 2022-09-12 NOTE — Progress Notes (Signed)
  Chronic Care Management   Note  09/12/2022 Name: [name] MRN: [id number] DOB: [birth year]  [name] is a 87 y.o. year old male who is a primary care patient of [name], [place]. I reached out to [name] by phone today in response to a referral sent by [name]'s PCP.  The second contact attempt was unsuccessful.   Follow up plan: Additional outreach attempts will be made.  Noreene Larsson, [place], [place] [address] Direct Dial: [phone] [email or web address]

## 2022-09-25 NOTE — Progress Notes (Signed)
  Chronic Care Management   Note  09/25/2022 Name: AFOLABI BALTZER MRN: [id number] DOB: 1936-08-01  [name] is a 87 y.o. year old male who is a primary care patient of [name], [place]. I reached out to [name] by phone today in response to a referral sent by Mr. Saavan Kerkstra Cass[place]'s PCP.  The third contact attempt was unsuccessful.   Follow up plan: Unable to reach patient after 3 attempts. No further outreach attempts will be made pending additional provider engagement, patient request, or new provider order.   Noreene Larsson, [place],  [address] Direct Dial: [phone] [email or web address]

## 2022-10-02 ENCOUNTER — Ambulatory Visit
Admission: RE | Admit: 2022-10-02 | Discharge: 2022-10-02 | Disposition: A | Discharge status: Home / Self Care | Payer: Medicare PPO | Source: Ambulatory Visit | Attending: Nurse Practitioner | Admitting: Nurse Practitioner

## 2022-10-02 DIAGNOSIS — N281 Cyst of kidney, acquired: Secondary | ICD-10-CM | POA: Diagnosis not present

## 2022-10-02 DIAGNOSIS — R1900 Intra-abdominal and pelvic swelling, mass and lump, unspecified site: Secondary | ICD-10-CM

## 2022-10-11 DIAGNOSIS — H34231 Retinal artery branch occlusion, right eye: Secondary | ICD-10-CM | POA: Diagnosis not present

## 2022-10-11 DIAGNOSIS — H35362 Drusen (degenerative) of macula, left eye: Secondary | ICD-10-CM | POA: Diagnosis not present

## 2022-10-11 DIAGNOSIS — H40013 Open angle with borderline findings, low risk, bilateral: Secondary | ICD-10-CM | POA: Diagnosis not present

## 2022-10-11 DIAGNOSIS — H35372 Puckering of macula, left eye: Secondary | ICD-10-CM | POA: Diagnosis not present

## 2022-10-17 ENCOUNTER — Other Ambulatory Visit: Payer: Self-pay | Admitting: *Deleted

## 2022-10-17 DIAGNOSIS — I739 Peripheral vascular disease, unspecified: Secondary | ICD-10-CM

## 2022-10-25 ENCOUNTER — Telehealth: Payer: Self-pay

## 2022-10-25 NOTE — Telephone Encounter (Signed)
LVM for Robyn to return call

## 2022-10-25 NOTE — Telephone Encounter (Signed)
Patient's daughter ([name]/DPR) called to request results from recent CT scan. No result notes attached to order.  Please review, thank you!

## 2022-11-05 ENCOUNTER — Ambulatory Visit: Payer: Medicare PPO | Admitting: Physician Assistant

## 2022-11-05 ENCOUNTER — Ambulatory Visit (HOSPITAL_COMMUNITY)
Admission: RE | Admit: 2022-11-05 | Discharge: 2022-11-05 | Disposition: A | Discharge status: Home / Self Care | Payer: Medicare PPO | Source: Ambulatory Visit | Attending: Vascular Surgery | Admitting: Vascular Surgery

## 2022-11-05 ENCOUNTER — Encounter: Payer: Self-pay | Admitting: Physician Assistant

## 2022-11-05 VITALS — BP 171/83 | HR 51 | Temp 97.8°F | Wt 182.0 lb

## 2022-11-05 DIAGNOSIS — I739 Peripheral vascular disease, unspecified: Secondary | ICD-10-CM | POA: Insufficient documentation

## 2022-11-05 DIAGNOSIS — I6523 Occlusion and stenosis of bilateral carotid arteries: Secondary | ICD-10-CM | POA: Diagnosis not present

## 2022-11-05 LAB — VAS US ABI WITH/WO TBI
Left ABI: 0.85
Right ABI: 0.53

## 2022-11-05 NOTE — Progress Notes (Unsigned)
Office Note     CC:  follow up Requesting Provider:  [name], FNP  HPI: [name] is a 87 y.o. (1936/02/25) male who presents with his niece for today's routine follow up. 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 This was seen on previous angiogram by Dr. Irish Lack in April of 2021.  He does not have any cramping on ambulation. No pain at rest. No tissue loss. He complains of very intermittent cramping in his left leg at night time. He says he has to move around or get up to walk it off. This only occurs in left leg. He does have a painful area on ball of right foot where his bone is very prominent. His niece explained that he was seen by podiatrist who explained that the fat pad on his foot has essentially deteriorated so he has to just wear inserts and well fitted shoes with a lot of cushion.  He continues to wear a brace on RLE and RUE since prior stroke. He however gets around fairly well. He is compliant with his Aspirin, Statin and Plavix.   Past Medical History:  Diagnosis Date   Arthritis    Bundle branch block    Hypercholesteremia    Hyperlipidemia 2001   Hypertension 2001   Hypertension    Peripheral vascular disease    Stroke    Stroke    2001 - R side deficits    Past Surgical History:  Procedure Laterality Date   ABDOMINAL AORTOGRAM N/A 11/18/2019   Procedure: ABDOMINAL AORTOGRAM;  Surgeon: Jettie Booze, MD;  Location: [facility];  Service: Cardiovascular;  Laterality: N/A;   CORONARY ARTERY BYPASS GRAFT N/A 11/20/2019   Procedure: CORONARY ARTERY BYPASS GRAFTING (CABG), ON PUMP, TIMES THREE, USING LEFT INTERNAL MAMMARY ARTERY AND ENDOSCOPICALLY HARVESTED RIGHT GREATER SAPHENOUS VEIN;  Surgeon: Wonda Olds, MD;  Location: [facility];  Service: Open Heart Surgery;  Laterality: N/A;   LEFT HEART CATH AND CORONARY ANGIOGRAPHY N/A 11/18/2019   Procedure: LEFT HEART CATH AND CORONARY ANGIOGRAPHY;  Surgeon: Jettie Booze, MD;  Location: [facility];  Service: Cardiovascular;  Laterality: N/A;   TEE WITHOUT CARDIOVERSION N/A 11/20/2019   Procedure: TRANSESOPHAGEAL ECHOCARDIOGRAM (TEE);  Surgeon: Wonda Olds, MD;  Location: [facility];  Service: Open Heart Surgery;  Laterality: N/A;    Social History   Socioeconomic History   Marital status: Single    Spouse name: Not on file   Number of children: Not on file   Years of education: Not on file   Highest education level: Not on file  Occupational History   Occupation: retired  Tobacco Use   Smoking status: Never   Smokeless tobacco: Never  Vaping Use   Vaping Use: Not on file  Substance and Sexual Activity   Alcohol use: Never   Drug use: Never   Sexual activity: Not Currently  Other Topics Concern   Not on file  Social History Narrative   ** Merged History Encounter **       Social Determinants of Health   Financial Resource Strain: Low Risk  (03/21/2022)   Overall Financial Resource Strain (CARDIA)    Difficulty of Paying Living Expenses: Not hard at all  Food Insecurity: No Food Insecurity (03/21/2022)   Hunger Vital Sign    Worried About Running Out of Food in the Last Year: Never true    [place] in the Last Year:  Never true  Transportation Needs: No Transportation Needs (03/21/2022)   PRAPARE - Hydrologist (Medical): No    Lack of Transportation (Non-Medical): No  Physical Activity: Inactive (03/21/2022)   Exercise Vital Sign    Days of Exercise per Week: 0 days    Minutes of Exercise per Session: 0 min  Stress: No Stress Concern Present (03/21/2022)   [place]    Feeling of Stress : Not at all  Social Connections: Not on file  Intimate Partner Violence: Not At Risk (05/15/2018)   Humiliation, Afraid, Rape, and Kick questionnaire    Fear of Current or Ex-Partner: No    Emotionally Abused: No    Physically  Abused: No    Sexually Abused: No    Family History  Problem Relation Age of Onset   Stroke Mother    Stroke Father    Heart attack Father    Cancer Brother     Current Outpatient Medications  Medication Sig Dispense Refill   acetaminophen (TYLENOL) 325 MG tablet Take 2 tablets (650 mg total) by mouth every 4 (four) hours as needed for headache or mild pain.     aspirin EC 81 MG tablet Take 81 mg by mouth daily.     cetirizine (ZYRTEC) 10 MG tablet Take 10 mg by mouth as needed for allergies.     Cholecalciferol (VITAMIN D) 50 MCG (2000 UT) tablet Take 2,000 Units by mouth daily.     clopidogrel (PLAVIX) 75 MG tablet Take 1 tablet (75 mg total) by mouth daily. 90 tablet 1   fluticasone (FLONASE) 50 MCG/ACT nasal spray Place 2 sprays into both nostrils daily. 16 g 2   Misc Natural Products (ELDERBERRY IMMUNE COMPLEX PO) Take by mouth daily.     montelukast (SINGULAIR) 10 MG tablet Take 1 tablet by mouth daily 90 tablet 1   polyethylene glycol (MIRALAX / GLYCOLAX) 17 g packet Take 17 g by mouth daily. (Patient taking differently: Take 17 g by mouth daily as needed.) 14 each 0   rosuvastatin (CRESTOR) 20 MG tablet TAKE 1 TABLET(20 MG) BY MOUTH DAILY 90 tablet 1   tamsulosin (FLOMAX) 0.4 MG CAPS capsule Take 1 capsule (0.4 mg total) by mouth daily. 90 capsule 1   zinc gluconate 50 MG tablet Take 50 mg by mouth daily.     No current facility-administered medications for this visit.    No Known Allergies   REVIEW OF SYSTEMS:   [X]  denotes positive finding, [ ]  denotes negative finding Cardiac  Comments:  Chest pain or chest pressure:    Shortness of breath upon exertion:    Short of breath when lying flat:    Irregular heart rhythm:        Vascular    Pain in calf, thigh, or hip brought on by ambulation:    Pain in feet at night that wakes you up from your sleep:     Blood clot in your veins:    Leg swelling:         Pulmonary    Oxygen at home:    Productive cough:      Wheezing:         Neurologic    Sudden weakness in arms or legs:     Sudden numbness in arms or legs:     Sudden onset of difficulty speaking or slurred speech:    Temporary loss of vision in one eye:  Problems with dizziness:         Gastrointestinal    Blood in stool:     Vomited blood:         Genitourinary    Burning when urinating:     Blood in urine:        Psychiatric    Major depression:         Hematologic    Bleeding problems:    Problems with blood clotting too easily:        Skin    Rashes or ulcers:        Constitutional    Fever or chills:      PHYSICAL EXAMINATION:  Vitals:   11/05/22 1407  BP: (!) 171/83  Pulse: (!) 51  Temp: 97.8 F (36.6 C)  TempSrc: Temporal  SpO2: 96%  Weight: 182 lb (82.6 kg)    General:  WDWN in NAD; vital signs documented above Gait: Normal HENT: WNL, normocephalic Pulmonary: normal non-labored breathing , without wheezing Cardiac: regular HR Abdomen: soft, NT, no masses Extremities: without ischemic changes, without Gangrene , without cellulitis; without open wounds. Palpable femoral pulses bilaterally, palpable left DP. No distal pulses palpable on right foot. Warm and well perfused.  Musculoskeletal: no muscle wasting or atrophy  Neurologic: A&O X 3 Psychiatric:  The pt has Normal affect.   Non-Invasive Vascular Imaging:   +-------+-----------+-----------+------------+------------+  ABI/TBIToday's ABIToday's TBIPrevious ABIPrevious TBI  +-------+-----------+-----------+------------+------------+  Right 0.53       0.48       0.61        0.35          +-------+-----------+-----------+------------+------------+  Left  0.85       0.65       0.93        0.51          +-------+-----------+-----------+------------+------------+   ASSESSMENT/PLAN:: 87 y.o. male here for follow up for PAD. ABIs today are essentially unchanged from prior year. He does have some cramping in left leg but do not  think this is related to his arterial disease. He has palpable pulse in left foot. Despite known right common iliac artery occlusion he is without any rest pain, claudication or tissue loss - continue Aspirin, Plavix, Statin - Has known 1-39% ICA stenosis. At last visit patient and niece elected to not follow this annually - Patient and niece know to call for earlier follow up if he has any new or concerning symptoms - Will follow up in 1 year with repeat ABIs   Karoline Caldwell, PA-C [facility] [phone]  On call MD:   Carlis Abbott

## 2022-11-06 ENCOUNTER — Encounter: Payer: Self-pay | Admitting: Physician Assistant

## 2022-11-23 ENCOUNTER — Other Ambulatory Visit: Payer: Self-pay | Admitting: Nurse Practitioner

## 2023-01-02 ENCOUNTER — Other Ambulatory Visit: Payer: Self-pay

## 2023-01-02 NOTE — Progress Notes (Signed)
Patient attempted to be outreached by Rashad Auld, PharmD Candidate on 01/02/23 to discuss hypertension. Left voicemail for patient to return our call at their convenience at [phone].  Akacia Boltz, Student-PharmD  

## 2023-01-03 ENCOUNTER — Ambulatory Visit: Payer: Medicare PPO | Admitting: Nurse Practitioner

## 2023-01-03 NOTE — Progress Notes (Deleted)
Hershal Coria Jill Stopka,acting as a Neurosurgeon for [name], FNP.,have documented all relevant documentation on the behalf of [name], FNP,as directed by  [name], FNP while in the presence of [name], FNP.    Subjective:     Patient ID: [name] , male    DOB: 01/18/1936 , 87 y.o.   MRN: [id number]   No chief complaint on file.   HPI  Patient presents today for a BP, Pre DM, and chol check, Patient reports compliance with medications and has no other concerns today.     Past Medical History:  Diagnosis Date  . Arthritis   . Bundle branch block   . Hypercholesteremia   . Hyperlipidemia 2001  . Hypertension 2001  . Hypertension   . Peripheral vascular disease (HCC)   . Stroke (HCC)   . Stroke [facility])    2001 - R side deficits     Family History  Problem Relation Age of Onset  . Stroke Mother   . Stroke Father   . Heart attack Father   . Cancer Brother      Current Outpatient Medications:  .  acetaminophen (TYLENOL) 325 MG tablet, Take 2 tablets (650 mg total) by mouth every 4 (four) hours as needed for headache or mild pain., Disp:  , Rfl:  .  aspirin EC 81 MG tablet, Take 81 mg by mouth daily., Disp: , Rfl:  .  cetirizine (ZYRTEC) 10 MG tablet, Take 10 mg by mouth as needed for allergies., Disp: , Rfl:  .  Cholecalciferol (VITAMIN D) 50 MCG (2000 UT) tablet, Take 2,000 Units by mouth daily., Disp: , Rfl:  .  clopidogrel (PLAVIX) 75 MG tablet, Take 1 tablet (75 mg total) by mouth daily., Disp: 90 tablet, Rfl: 1 .  fluticasone (FLONASE) 50 MCG/ACT nasal spray, Place 2 sprays into both nostrils daily., Disp: 16 g, Rfl: 2 .  Misc Natural Products (ELDERBERRY IMMUNE COMPLEX PO), Take by mouth daily., Disp: , Rfl:  .  montelukast (SINGULAIR) 10 MG tablet, Take 1 tablet by mouth daily, Disp: 90 tablet, Rfl: 1 .  polyethylene glycol (MIRALAX / GLYCOLAX) 17 g packet, Take 17 g by mouth daily. (Patient taking differently: Take 17 g by mouth daily as needed.), Disp:  14 each, Rfl: 0 .  rosuvastatin (CRESTOR) 20 MG tablet, TAKE 1 TABLET(20 MG) BY MOUTH DAILY, Disp: 90 tablet, Rfl: 1 .  tamsulosin (FLOMAX) 0.4 MG CAPS capsule, Take 1 capsule (0.4 mg total) by mouth daily., Disp: 90 capsule, Rfl: 1 .  zinc gluconate 50 MG tablet, Take 50 mg by mouth daily., Disp: , Rfl:    No Known Allergies   Review of Systems   There were no vitals filed for this visit. There is no height or weight on file to calculate BMI.  The ASCVD Risk score (Arnett DK, et al., 2019) failed to calculate for the following reasons:   The 2019 ASCVD risk score is only valid for ages 39 to 53   The patient has a prior MI or stroke diagnosis ++ Objective:  Physical Exam      Assessment And Plan:     1. Essential hypertension  2. Prediabetes  3. Mixed hyperlipidemia    No follow-ups on file.  Patient was given opportunity to ask questions. Patient verbalized understanding of the plan and was able to repeat key elements of the plan. All questions were answered to their satisfaction.  Marlyn Corporal, CMA   I, Jarome Lamas  Daphine Deutscher, CMA, have reviewed all documentation for this visit. The documentation on 01/03/23 for the exam, diagnosis, procedures, and orders are all accurate and complete.   IF YOU HAVE BEEN REFERRED TO A SPECIALIST, IT MAY TAKE 1-2 WEEKS TO SCHEDULE/PROCESS THE REFERRAL. IF YOU HAVE NOT HEARD FROM US/SPECIALIST IN TWO WEEKS, PLEASE GIVE [place] A CALL AT [phone].   THE PATIENT IS ENCOURAGED TO PRACTICE SOCIAL DISTANCING DUE TO THE COVID-19 PANDEMIC.

## 2023-02-22 DIAGNOSIS — R351 Nocturia: Secondary | ICD-10-CM | POA: Diagnosis not present

## 2023-02-22 DIAGNOSIS — N401 Enlarged prostate with lower urinary tract symptoms: Secondary | ICD-10-CM | POA: Diagnosis not present

## 2023-02-22 DIAGNOSIS — R3915 Urgency of urination: Secondary | ICD-10-CM | POA: Diagnosis not present

## 2023-03-16 ENCOUNTER — Other Ambulatory Visit: Payer: Self-pay | Admitting: Nurse Practitioner

## 2023-03-16 DIAGNOSIS — J309 Allergic rhinitis, unspecified: Secondary | ICD-10-CM

## 2023-03-27 ENCOUNTER — Ambulatory Visit: Payer: Medicare PPO

## 2023-04-04 ENCOUNTER — Ambulatory Visit: Payer: Medicare PPO

## 2023-04-18 ENCOUNTER — Ambulatory Visit: Payer: Medicare PPO

## 2023-04-26 ENCOUNTER — Emergency Department (HOSPITAL_COMMUNITY)
Admission: EM | Admit: 2023-04-26 | Discharge: 2023-04-26 | Disposition: A | Discharge status: Home / Self Care | Payer: Medicare PPO | Attending: Emergency Medicine | Admitting: Emergency Medicine

## 2023-04-26 ENCOUNTER — Emergency Department (HOSPITAL_COMMUNITY): Payer: Medicare PPO

## 2023-04-26 DIAGNOSIS — I10 Essential (primary) hypertension: Secondary | ICD-10-CM | POA: Insufficient documentation

## 2023-04-26 DIAGNOSIS — S299XXA Unspecified injury of thorax, initial encounter: Secondary | ICD-10-CM | POA: Diagnosis not present

## 2023-04-26 DIAGNOSIS — Z8673 Personal history of transient ischemic attack (TIA), and cerebral infarction without residual deficits: Secondary | ICD-10-CM | POA: Insufficient documentation

## 2023-04-26 DIAGNOSIS — Y9301 Activity, walking, marching and hiking: Secondary | ICD-10-CM | POA: Diagnosis not present

## 2023-04-26 DIAGNOSIS — R0989 Other specified symptoms and signs involving the circulatory and respiratory systems: Secondary | ICD-10-CM | POA: Diagnosis not present

## 2023-04-26 DIAGNOSIS — S01112A Laceration without foreign body of left eyelid and periocular area, initial encounter: Secondary | ICD-10-CM | POA: Diagnosis not present

## 2023-04-26 DIAGNOSIS — S0990XA Unspecified injury of head, initial encounter: Secondary | ICD-10-CM | POA: Diagnosis not present

## 2023-04-26 DIAGNOSIS — M50222 Other cervical disc displacement at C5-C6 level: Secondary | ICD-10-CM | POA: Diagnosis not present

## 2023-04-26 DIAGNOSIS — S0083XA Contusion of other part of head, initial encounter: Secondary | ICD-10-CM

## 2023-04-26 DIAGNOSIS — M5030 Other cervical disc degeneration, unspecified cervical region: Secondary | ICD-10-CM | POA: Diagnosis not present

## 2023-04-26 DIAGNOSIS — W01190A Fall on same level from slipping, tripping and stumbling with subsequent striking against furniture, initial encounter: Secondary | ICD-10-CM | POA: Insufficient documentation

## 2023-04-26 DIAGNOSIS — Z7902 Long term (current) use of antithrombotics/antiplatelets: Secondary | ICD-10-CM | POA: Diagnosis not present

## 2023-04-26 DIAGNOSIS — Z7982 Long term (current) use of aspirin: Secondary | ICD-10-CM | POA: Diagnosis not present

## 2023-04-26 DIAGNOSIS — Z043 Encounter for examination and observation following other accident: Secondary | ICD-10-CM | POA: Diagnosis not present

## 2023-04-26 DIAGNOSIS — W19XXXA Unspecified fall, initial encounter: Secondary | ICD-10-CM

## 2023-04-26 DIAGNOSIS — I6782 Cerebral ischemia: Secondary | ICD-10-CM | POA: Diagnosis not present

## 2023-04-26 DIAGNOSIS — R519 Headache, unspecified: Secondary | ICD-10-CM | POA: Diagnosis present

## 2023-04-26 DIAGNOSIS — I700 Atherosclerosis of aorta: Secondary | ICD-10-CM | POA: Diagnosis not present

## 2023-04-26 DIAGNOSIS — M47812 Spondylosis without myelopathy or radiculopathy, cervical region: Secondary | ICD-10-CM | POA: Diagnosis not present

## 2023-04-26 LAB — COMPREHENSIVE METABOLIC PANEL
ALT: 39 U/L (ref 0–44)
AST: 31 U/L (ref 15–41)
Albumin: 3.8 g/dL (ref 3.5–5.0)
Alkaline Phosphatase: 58 U/L (ref 38–126)
Anion gap: 11 (ref 5–15)
BUN: 16 mg/dL (ref 8–23)
CO2: 22 mmol/L (ref 22–32)
Calcium: 8.9 mg/dL (ref 8.9–10.3)
Chloride: 107 mmol/L (ref 98–111)
Creatinine, Ser: 1.31 mg/dL — ABNORMAL HIGH (ref 0.61–1.24)
GFR, Estimated: 53 mL/min — ABNORMAL LOW (ref >60)
Glucose, Bld: 106 mg/dL — ABNORMAL HIGH (ref 70–99)
Potassium: 3.7 mmol/L (ref 3.5–5.1)
Sodium: 140 mmol/L (ref 135–145)
Total Bilirubin: 1.1 mg/dL (ref 0.3–1.2)
Total Protein: 6.5 g/dL (ref 6.5–8.1)

## 2023-04-26 LAB — CBC
HCT: 37.9 % — ABNORMAL LOW (ref 39.0–52.0)
Hemoglobin: 13.2 g/dL (ref 13.0–17.0)
MCH: 32.8 pg (ref 26.0–34.0)
MCHC: 34.8 g/dL (ref 30.0–36.0)
MCV: 94.3 fL (ref 80.0–100.0)
Platelets: 231 10*3/uL (ref 150–400)
RBC: 4.02 MIL/uL — ABNORMAL LOW (ref 4.22–5.81)
RDW: 16.7 % — ABNORMAL HIGH (ref 11.5–15.5)
WBC: 6.8 10*3/uL (ref 4.0–10.5)
nRBC: 0 % (ref 0.0–0.2)

## 2023-04-26 LAB — I-STAT CHEM 8, ED
BUN: 18 mg/dL (ref 8–23)
Calcium, Ion: 1.14 mmol/L — ABNORMAL LOW (ref 1.15–1.40)
Chloride: 107 mmol/L (ref 98–111)
Creatinine, Ser: 1.1 mg/dL (ref 0.61–1.24)
Glucose, Bld: 99 mg/dL (ref 70–99)
HCT: 40 % (ref 39.0–52.0)
Hemoglobin: 13.6 g/dL (ref 13.0–17.0)
Potassium: 3.6 mmol/L (ref 3.5–5.1)
Sodium: 141 mmol/L (ref 135–145)
TCO2: 21 mmol/L — ABNORMAL LOW (ref 22–32)

## 2023-04-26 NOTE — ED Provider Notes (Signed)
[place] EMERGENCY DEPARTMENT AT [facility] Provider Note   CSN: [id number] Arrival date & time: 04/26/23  1701     History  Chief Complaint  Patient presents with   [name] is a 87 y.o. male.   Fall  Patient reports that he was walking from porch when he slipped on water.  He reports that he fell forward and hit his head on the ground.  He reports some headache but is denying other pain.  Does not believe that he lost consciousness in the fall.  Reports trouble opening his left eye but otherwise does not have difficulty seeing.     Home Medications Prior to Admission medications   Medication Sig Start Date End Date Taking? Authorizing Provider  acetaminophen (TYLENOL) 325 MG tablet Take 2 tablets (650 mg total) by mouth every 4 (four) hours as needed for headache or mild pain. 11/30/19   Barrett, Rae Roam, PA-C  aspirin EC 81 MG tablet Take 81 mg by mouth daily.    [provider]  cetirizine (ZYRTEC) 10 MG tablet Take 10 mg by mouth as needed for allergies.    [provider]  Cholecalciferol (VITAMIN D) 50 MCG (2000 UT) tablet Take 2,000 Units by mouth daily.    [provider]  clopidogrel (PLAVIX) 75 MG tablet Take 1 tablet (75 mg total) by mouth daily. 05/15/22   [name], FNP  fluticasone (FLONASE) 50 MCG/ACT nasal spray Place 2 sprays into both nostrils daily. 05/30/20   [name], FNP  Misc Natural Products (ELDERBERRY IMMUNE COMPLEX PO) Take by mouth daily.    [provider]  montelukast (SINGULAIR) 10 MG tablet TAKE 1 TABLET BY MOUTH DAILY 03/18/23   [name], FNP  polyethylene glycol (MIRALAX / GLYCOLAX) 17 g packet Take 17 g by mouth daily. Patient taking differently: Take 17 g by mouth daily as needed. 11/05/19   Pokhrel, Rebekah Chesterfield, MD  rosuvastatin (CRESTOR) 20 MG tablet TAKE 1 TABLET(20 MG) BY MOUTH DAILY 11/23/22   [name], FNP  tamsulosin (FLOMAX) 0.4 MG CAPS capsule Take 1 capsule (0.4  mg total) by mouth daily. 11/06/21   [name], FNP  zinc gluconate 50 MG tablet Take 50 mg by mouth daily.    [provider]      Allergies    Patient has no known allergies.    Review of Systems   Review of Systems  Physical Exam Updated Vital Signs BP (!) 172/82 (BP Location: Left Arm)   Pulse 75   Temp (!) 97.5 F (36.4 C) (Oral)   Resp 13   Ht 6' (1.829 m)   Wt 83.9 kg   SpO2 90%   BMI 25.09 kg/m  Physical Exam Vitals and nursing note reviewed.  Constitutional:      General: He is not in acute distress.    Appearance: He is well-developed.  HENT:     Head: Normocephalic.     Comments: Hematoma over the left eyebrow, small laceration, hemostatic Eyes:     Extraocular Movements: Extraocular movements intact.     Conjunctiva/sclera: Conjunctivae normal.     Pupils: Pupils are equal, round, and reactive to light.  Cardiovascular:     Rate and Rhythm: Normal rate and regular rhythm.     Heart sounds: No murmur heard. Pulmonary:     Effort: Pulmonary effort is normal. No respiratory distress.     Breath sounds: Normal breath sounds.  Abdominal:  Palpations: Abdomen is soft.     Tenderness: There is no abdominal tenderness.  Musculoskeletal:        General: No swelling.     Cervical back: Normal range of motion and neck supple. No rigidity or tenderness.  Skin:    General: Skin is warm and dry.     Capillary Refill: Capillary refill takes less than 2 seconds.  Neurological:     Mental Status: He is alert.     ED Results / Procedures / Treatments   Labs (all labs ordered are listed, but only abnormal results are displayed) Labs Reviewed  COMPREHENSIVE METABOLIC PANEL - Abnormal; Notable for the following components:      Result Value   Glucose, Bld 106 (*)    Creatinine, Ser 1.31 (*)    GFR, Estimated 53 (*)    All other components within normal limits  CBC - Abnormal; Notable for the following components:   RBC 4.02 (*)    HCT 37.9 (*)     RDW 16.7 (*)    All other components within normal limits  I-STAT CHEM 8, ED - Abnormal; Notable for the following components:   Calcium, Ion 1.14 (*)    TCO2 21 (*)    All other components within normal limits    EKG None  Radiology CT HEAD WO CONTRAST  Result Date: 04/26/2023 CLINICAL DATA:  Head trauma fall EXAM: CT HEAD WITHOUT CONTRAST TECHNIQUE: Contiguous axial images were obtained from the base of the skull through the vertex without intravenous contrast. RADIATION DOSE REDUCTION: This exam was performed according to the departmental dose-optimization program which includes automated exposure control, adjustment of the William and/or kV according to patient size and/or use of iterative reconstruction technique. COMPARISON:  CT brain 07/22/2022 FINDINGS: Brain: No acute territorial infarction, hemorrhage, or intracranial mass. The ventricles are nonenlarged. Atrophy. Mild chronic small vessel ischemic changes of the white matter Vascular: No hyperdense vessels.  Carotid vascular calcification. Skull: Normal. Negative for fracture or focal lesion. Sinuses/Orbits: Large left periorbital and forehead hematoma. Sinuses are clear. Other: None IMPRESSION: 1. No CT evidence for acute intracranial abnormality. 2. Atrophy and chronic small vessel ischemic changes of the white matter. 3. Large left periorbital and forehead hematoma. Electronically Signed   By: Jasmine Pang M.D.   On: 04/26/2023 19:02   CT CERVICAL SPINE WO CONTRAST  Result Date: 04/26/2023 CLINICAL DATA:  Fall EXAM: CT CERVICAL SPINE WITHOUT CONTRAST TECHNIQUE: Multidetector CT imaging of the cervical spine was performed without intravenous contrast. Multiplanar CT image reconstructions were also generated. RADIATION DOSE REDUCTION: This exam was performed according to the departmental dose-optimization program which includes automated exposure control, adjustment of the William and/or kV according to patient size and/or use of iterative  reconstruction technique. COMPARISON:  CT 07/22/2022 FINDINGS: Alignment: Reversal of cervical lordosis. Trace retrolisthesis C5-C6 and C6-C7. Skull base and vertebrae: No acute fracture. No primary bone lesion or focal pathologic process. Soft tissues and spinal canal: No prevertebral fluid or swelling. No visible canal hematoma. Disc levels: Fusion at C3-C4. Advanced disc space narrowing and degenerative change C5-C6 and C6-C7. Facet degenerative changes at multiple levels with foraminal narrowing Upper chest: Negative. Other: None IMPRESSION: 1. No CT evidence for acute osseous abnormality. 2. Reversal of cervical lordosis with multilevel degenerative changes. Electronically Signed   By: Jasmine Pang M.D.   On: 04/26/2023 18:55   DG Pelvis Portable  Result Date: 04/26/2023 CLINICAL DATA:  Fall, chronic anticoagulation EXAM: PORTABLE PELVIS 1-2 VIEWS COMPARISON:  07/22/2022 FINDINGS: Normal alignment. No acute fracture or dislocation. Vascular calcifications noted. IMPRESSION: 1. No acute fracture or dislocation. Electronically Signed   By: Helyn Numbers M.D.   On: 04/26/2023 18:38   DG Chest Port 1 View  Result Date: 04/26/2023 CLINICAL DATA:  Trauma fall on blood thinners EXAM: PORTABLE CHEST 1 VIEW COMPARISON:  07/22/2022 FINDINGS: Post sternotomy changes and left atrial appendage clip. Low lung volumes. Normal cardiac size. Aortic atherosclerosis. No focal opacity, pleural effusion or pneumothorax. IMPRESSION: Low lung volumes. Electronically Signed   By: Jasmine Pang M.D.   On: 04/26/2023 18:37    Procedures Procedures    Medications Ordered in ED Medications - No data to display  ED Course/ Medical Decision Making/ A&P                                 Medical Decision Making Amount and/or Complexity of Data Reviewed Labs: ordered. Radiology: ordered.   Patient is an 87 year old male with a history of peripheral vascular disease, hypertension, A-fib, prior CVA with residual  right-sided deficits, pulmonary hypertension, hyperlipidemia presenting for fall.  On my initial evaluation, he is afebrile, hemodynamically stable, in no acute distress.  Reports mechanical fall related to slipping on water on the porch with head trauma.  Additionally endorses use of Plavix.  On exam, there is obvious hematoma with overlying laceration above the left eye.  Extraocular motion pupillary reflex are intact.  Presentation concerning for intracranial bleed, skull fracture, C-spine fracture.  His swelling is primarily in hemorrhoids eyebrow, do not suspect other maxillary or mandibular fracture.  It seems to given description of mechanical fall, I have less concern for underlying etiology including seizure syncope.  Results reviewed.  CBC without leukocytosis or anemia.  CMP with overall normal electrolytes, creatinine is mildly elevated to 1.31, prior was 1.1.  No anion gap.  X-rays of chest, pelvis, CT of head and C-spine without significant acute traumatic injury.  Patient reassessed following the above workup.  He is maintaining stable vital signs and mental status.  Did apply Steri-Strips to 2 cm laceration to the left eyebrow.  Recommend outpatient follow-up with PCP.  His niece is at bedside reports that his tetanus is up-to-date.  He was discharged without further acute eval under my care in the emergency department.        Final Clinical Impression(s) / ED Diagnoses Final diagnoses:  Fall, initial encounter  Contusion of face, initial encounter    Rx / DC Orders ED Discharge Orders     None         Claretha Cooper, DO 04/26/23 2237    Eber Hong, MD 04/27/23 [phone]

## 2023-04-26 NOTE — ED Notes (Signed)
Patient transported to CT 

## 2023-04-26 NOTE — ED Triage Notes (Signed)
Patient BIB [facility] from home for mechanical fall on his porch hitting his head on his wood porch. Patient A&Ox4, no LOC, on plavix. Laceration and hematoma noted to left eyebrow. 20 L hand, VSS. GCS 15

## 2023-05-06 ENCOUNTER — Encounter: Payer: Self-pay | Admitting: Nurse Practitioner

## 2023-05-06 ENCOUNTER — Ambulatory Visit: Payer: Medicare PPO | Admitting: Nurse Practitioner

## 2023-05-06 VITALS — BP 140/60 | HR 52 | Temp 98.5°F | Ht 72.0 in | Wt 173.0 lb

## 2023-05-06 DIAGNOSIS — R7303 Prediabetes: Secondary | ICD-10-CM

## 2023-05-06 DIAGNOSIS — W19XXXD Unspecified fall, subsequent encounter: Secondary | ICD-10-CM | POA: Diagnosis not present

## 2023-05-06 DIAGNOSIS — Z23 Encounter for immunization: Secondary | ICD-10-CM | POA: Diagnosis not present

## 2023-05-06 DIAGNOSIS — E782 Mixed hyperlipidemia: Secondary | ICD-10-CM

## 2023-05-06 DIAGNOSIS — I10 Essential (primary) hypertension: Secondary | ICD-10-CM

## 2023-05-06 NOTE — Patient Instructions (Addendum)
Encouraged to get life alert.  Steri-strips will come off on their own.  You will come back on Friday October 4th for your AWV. Please be sure to make this appt it is for Medicare

## 2023-05-06 NOTE — Progress Notes (Signed)
[name], CMA,acting as a Neurosurgeon for [name], FNP.,have documented all relevant documentation on the behalf of [name], FNP,as directed by  [name], FNP while in the presence of [name], FNP.  Subjective:  Patient ID: [name] , male    DOB: Feb 22, 1936 , 87 y.o.   MRN: [id number]  Chief Complaint  Patient presents with   Fall    HPI  Patient presents today for a hospital follow up, patient went to the hospital on 04/26/2023 for a fall. Patient reports he slipped going into the house. Unsure if the mat moved when he was trying to step up into the doorway.  He did not have his cane with him at the time. Patients left side of his face is very bruised, patient also has a small cut on his left eyebrow. Denies losing consciousness.  Patient reports he is feeling better.  Patient will also have bp, chol, and pre DM follow up.  He is here today with Lynden Ang.   BP Readings from Last 3 Encounters: 05/06/23 : (!) 140/70 04/26/23 : (!) 172/82 11/05/22 : (!) 171/83       Past Medical History:  Diagnosis Date   Arthritis    Bundle branch block    Hypercholesteremia    Hyperlipidemia 2001   Hypertension 2001   Hypertension    Peripheral vascular disease (HCC)    Stroke (HCC)    Stroke (HCC)    2001 - R side deficits     Family History  Problem Relation Age of Onset   Stroke Mother    Stroke Father    Heart attack Father    Cancer Brother      Current Outpatient Medications:    acetaminophen (TYLENOL) 325 MG tablet, Take 2 tablets (650 mg total) by mouth every 4 (four) hours as needed for headache or mild pain., Disp:  , Rfl:    aspirin EC 81 MG tablet, Take 81 mg by mouth daily., Disp: , Rfl:    cetirizine (ZYRTEC) 10 MG tablet, Take 10 mg by mouth as needed for allergies., Disp: , Rfl:    Cholecalciferol (VITAMIN D) 50 MCG (2000 UT) tablet, Take 2,000 Units by mouth daily., Disp: , Rfl:    clopidogrel (PLAVIX) 75 MG tablet, Take 1 tablet (75 mg  total) by mouth daily., Disp: 90 tablet, Rfl: 1   fluticasone (FLONASE) 50 MCG/ACT nasal spray, Place 2 sprays into both nostrils daily., Disp: 16 g, Rfl: 2   Misc Natural Products (ELDERBERRY IMMUNE COMPLEX PO), Take by mouth daily., Disp: , Rfl:    montelukast (SINGULAIR) 10 MG tablet, TAKE 1 TABLET BY MOUTH DAILY, Disp: 90 tablet, Rfl: 1   polyethylene glycol (MIRALAX / GLYCOLAX) 17 g packet, Take 17 g by mouth daily. (Patient taking differently: Take 17 g by mouth daily as needed.), Disp: 14 each, Rfl: 0   rosuvastatin (CRESTOR) 20 MG tablet, TAKE 1 TABLET(20 MG) BY MOUTH DAILY, Disp: 90 tablet, Rfl: 1   tamsulosin (FLOMAX) 0.4 MG CAPS capsule, Take 1 capsule (0.4 mg total) by mouth daily., Disp: 90 capsule, Rfl: 1   zinc gluconate 50 MG tablet, Take 50 mg by mouth daily., Disp: , Rfl:    amLODipine (NORVASC) 2.5 MG tablet, Take 1 tablet (2.5 mg total) by mouth daily., Disp: 90 tablet, Rfl: 0   No Known Allergies   Review of Systems  Constitutional: Negative.   HENT: Negative.    Eyes:  Positive for redness (left eye).  Respiratory: Negative.    Cardiovascular: Negative.   Gastrointestinal: Negative.   Skin:        Left side of face with bruising  Neurological: Negative.   Psychiatric/Behavioral: Negative.       Today's Vitals   05/06/23 0931 05/06/23 1035  BP: (!) 140/70 (!) 140/60  Pulse: (!) 52   Temp: 98.5 F (36.9 C)   TempSrc: Oral   Weight: 173 lb (78.5 kg)   Height: 6' (1.829 m)    Body mass index is 23.46 kg/m.  Wt Readings from Last 3 Encounters:  05/06/23 173 lb (78.5 kg)  04/26/23 185 lb (83.9 kg)  11/05/22 182 lb (82.6 kg)     Objective:  Physical Exam Vitals reviewed.  Constitutional:      General: He is not in acute distress.    Appearance: Normal appearance. He is obese.  HENT:     Head: Normocephalic.  Eyes:     Conjunctiva/sclera:     Left eye: Hemorrhage present.  Cardiovascular:     Rate and Rhythm: Normal rate and regular rhythm.      Pulses: Normal pulses.     Heart sounds: Normal heart sounds. No murmur heard. Pulmonary:     Effort: Pulmonary effort is normal. No respiratory distress.     Breath sounds: Normal breath sounds. No wheezing.  Musculoskeletal:        General: Normal range of motion.     Comments: Right arm and leg in brace  Skin:    General: Skin is warm and dry.     Capillary Refill: Capillary refill takes less than 2 seconds.     Findings: Bruising (left side of face) present.  Neurological:     General: No focal deficit present.     Mental Status: He is alert and oriented to person, place, and time.     Cranial Nerves: No cranial nerve deficit.     Motor: No weakness.  Psychiatric:        Mood and Affect: Mood normal.        Behavior: Behavior normal.        Thought Content: Thought content normal.        Judgment: Judgment normal.         Assessment And Plan:  Fall, subsequent encounter Assessment & Plan: Had a fall and seen in ER, no injuries. Left eye has bruising.    Essential hypertension Assessment & Plan: B/P is elevated slightly improved with repeat, will start him on amlodipine 2.5 mg daily  CMP ordered to check renal function.   Orders: -     BMP8+eGFR  Prediabetes Assessment & Plan: Chronic, stable Encouraged to avoid sugary foods and drinks No current medications  Orders: -     Hemoglobin A1c  Mixed hyperlipidemia Assessment & Plan: Continue statin, tolerating well.   Orders: -     Lipid panel  Need for influenza vaccination Assessment & Plan: Influenza vaccine administered Encouraged to take Tylenol as needed for fever or muscle aches.   Orders: -     Flu Vaccine Trivalent High Dose (Fluad)    Return for 3 month bp check; AWV on October 4 already scheduled, HM in December with 3 month f/u .  Patient was given opportunity to ask questions. Patient verbalized understanding of the plan and was able to repeat key elements of the plan. All questions were  answered to their satisfaction.    Jeanell Sparrow, FNP, have reviewed all documentation for this  visit. The documentation on 05/21/23 for the exam, diagnosis, procedures, and orders are all accurate and complete.   IF YOU HAVE BEEN REFERRED TO A SPECIALIST, IT MAY TAKE 1-2 WEEKS TO SCHEDULE/PROCESS THE REFERRAL. IF YOU HAVE NOT HEARD FROM US/SPECIALIST IN TWO WEEKS, PLEASE GIVE [place] A CALL AT [phone].

## 2023-05-07 LAB — BMP8+EGFR
BUN/Creatinine Ratio: 11 (ref 10–24)
BUN: 11 mg/dL (ref 8–27)
CO2: 21 mmol/L (ref 20–29)
Calcium: 9.5 mg/dL (ref 8.6–10.2)
Chloride: 105 mmol/L (ref 96–106)
Creatinine, Ser: 1.01 mg/dL (ref 0.76–1.27)
Glucose: 91 mg/dL (ref 70–99)
Potassium: 4.7 mmol/L (ref 3.5–5.2)
Sodium: 143 mmol/L (ref 134–144)
eGFR: 73 mL/min/{1.73_m2} (ref >59)

## 2023-05-07 LAB — LIPID PANEL
Chol/HDL Ratio: 3 {ratio} (ref 0.0–5.0)
Cholesterol, Total: 145 mg/dL (ref 100–199)
HDL: 49 mg/dL (ref >39)
LDL Chol Calc (NIH): 77 mg/dL (ref 0–99)
Triglycerides: 104 mg/dL (ref 0–149)
VLDL Cholesterol Cal: 19 mg/dL (ref 5–40)

## 2023-05-07 LAB — HEMOGLOBIN A1C
Est. average glucose Bld gHb Est-mCnc: 128 mg/dL
Hgb A1c MFr Bld: 6.1 % — ABNORMAL HIGH (ref 4.8–5.6)

## 2023-05-09 ENCOUNTER — Other Ambulatory Visit: Payer: Self-pay

## 2023-05-09 DIAGNOSIS — I10 Essential (primary) hypertension: Secondary | ICD-10-CM

## 2023-05-09 MED ORDER — AMLODIPINE BESYLATE 2.5 MG PO TABS
2.5000 mg | ORAL_TABLET | Freq: Every day | ORAL | 0 refills | Status: DC
Start: 2023-05-09 — End: 2023-07-23

## 2023-05-10 ENCOUNTER — Encounter: Payer: Medicare PPO | Admitting: Family Medicine

## 2023-05-21 DIAGNOSIS — W19XXXA Unspecified fall, initial encounter: Secondary | ICD-10-CM | POA: Insufficient documentation

## 2023-05-21 DIAGNOSIS — Z23 Encounter for immunization: Secondary | ICD-10-CM | POA: Insufficient documentation

## 2023-05-21 NOTE — Assessment & Plan Note (Addendum)
Had a fall and seen in ER, no injuries. Left eye has bruising.

## 2023-05-21 NOTE — Assessment & Plan Note (Addendum)
B/P is elevated slightly improved with repeat, will start him on amlodipine 2.5 mg daily  CMP ordered to check renal function.

## 2023-05-21 NOTE — Assessment & Plan Note (Signed)
Chronic, stable  Encouraged to avoid sugary foods and drinks  No current medications

## 2023-05-21 NOTE — Assessment & Plan Note (Signed)
Influenza vaccine administered Encouraged to take Tylenol as needed for fever or muscle aches.

## 2023-05-21 NOTE — Assessment & Plan Note (Signed)
Continue statin, tolerating well

## 2023-05-27 ENCOUNTER — Other Ambulatory Visit: Payer: Self-pay

## 2023-05-27 MED ORDER — CLOPIDOGREL BISULFATE 75 MG PO TABS: 75.0000 mg | ORAL_TABLET | Freq: Every day | ORAL | 1 refills | Status: DC

## 2023-05-29 ENCOUNTER — Encounter: Payer: Self-pay | Admitting: Nurse Practitioner

## 2023-05-29 ENCOUNTER — Ambulatory Visit: Payer: Medicare PPO | Admitting: Nurse Practitioner

## 2023-05-29 ENCOUNTER — Ambulatory Visit: Payer: Medicare PPO

## 2023-05-29 VITALS — BP 124/70 | HR 61 | Temp 97.9°F | Ht 69.0 in | Wt 167.0 lb

## 2023-05-29 VITALS — BP 124/70 | HR 61 | Temp 97.7°F | Ht 69.8 in | Wt 167.8 lb

## 2023-05-29 DIAGNOSIS — T148XXA Other injury of unspecified body region, initial encounter: Secondary | ICD-10-CM

## 2023-05-29 DIAGNOSIS — Z23 Encounter for immunization: Secondary | ICD-10-CM | POA: Diagnosis not present

## 2023-05-29 DIAGNOSIS — Z0000 Encounter for general adult medical examination without abnormal findings: Secondary | ICD-10-CM

## 2023-05-29 NOTE — Progress Notes (Addendum)
Subjective:   [name] is a 87 y.o. male who presents for Medicare Annual/Subsequent preventive examination.  Visit Complete: In person    Cardiac Risk Factors include: advanced age (>57men, >64 women);dyslipidemia;hypertension;male gender     Objective:    Today's Vitals   05/29/23 0828  BP: 124/70  Pulse: 61  Temp: 97.7 F (36.5 C)  TempSrc: Oral  SpO2: 98%  Weight: 167 lb 12.8 oz (76.1 kg)  Height: 5' 9.8" (1.773 m)   Body mass index is 24.21 kg/m.     05/29/2023    8:41 AM 04/26/2023    7:05 PM 03/21/2022    4:00 PM 02/23/2021   10:06 AM 01/13/2021   10:01 AM 02/18/2020   11:25 AM 11/19/2019   11:00 AM  Advanced Directives  Does Patient Have a Medical Advance Directive? Yes No Yes Yes Yes Yes No  Type of Estate agent of [place];Living will  Healthcare Power of [employer] of [place];Living will Healthcare Power of [employer] Power of Attorney   Does patient want to make changes to medical advance directive?     No - Patient declined    Copy of Healthcare Power of Attorney in Chart? No - copy requested  No - copy requested No - copy requested  No - copy requested   Would patient like information on creating a medical advance directive?       No - Patient declined    Current Medications (verified) Outpatient Encounter Medications as of 05/29/2023  Medication Sig   acetaminophen (TYLENOL) 325 MG tablet Take 2 tablets (650 mg total) by mouth every 4 (four) hours as needed for headache or mild pain.   amLODipine (NORVASC) 2.5 MG tablet Take 1 tablet (2.5 mg total) by mouth daily.   aspirin EC 81 MG tablet Take 81 mg by mouth daily.   cetirizine (ZYRTEC) 10 MG tablet Take 10 mg by mouth as needed for allergies.   Cholecalciferol (VITAMIN D) 50 MCG (2000 UT) tablet Take 2,000 Units by mouth daily.   clopidogrel (PLAVIX) 75 MG tablet Take 1 tablet (75 mg total) by mouth daily.   fluticasone (FLONASE) 50 MCG/ACT nasal  spray Place 2 sprays into both nostrils daily.   Misc Natural Products (ELDERBERRY IMMUNE COMPLEX PO) Take by mouth daily.   montelukast (SINGULAIR) 10 MG tablet TAKE 1 TABLET BY MOUTH DAILY   polyethylene glycol (MIRALAX / GLYCOLAX) 17 g packet Take 17 g by mouth daily. (Patient taking differently: Take 17 g by mouth daily as needed.)   rosuvastatin (CRESTOR) 20 MG tablet TAKE 1 TABLET(20 MG) BY MOUTH DAILY   tamsulosin (FLOMAX) 0.4 MG CAPS capsule Take 1 capsule (0.4 mg total) by mouth daily.   zinc gluconate 50 MG tablet Take 50 mg by mouth daily.   No facility-administered encounter medications on file as of 05/29/2023.    Allergies (verified) Patient has no known allergies.   History: Past Medical History:  Diagnosis Date   Arthritis    Bundle branch block    Hypercholesteremia    Hyperlipidemia 2001   Hypertension 2001   Hypertension    Peripheral vascular disease (HCC)    Stroke [facility])    Stroke (HCC)    2001 - R side deficits   Past Surgical History:  Procedure Laterality Date   ABDOMINAL AORTOGRAM N/A 11/18/2019   Procedure: ABDOMINAL AORTOGRAM;  Surgeon: Corky Crafts, MD;  Location: [facility] INVASIVE CV LAB;  Service: Cardiovascular;  Laterality: N/A;  CORONARY ARTERY BYPASS GRAFT N/A 11/20/2019   Procedure: CORONARY ARTERY BYPASS GRAFTING (CABG), ON PUMP, TIMES THREE, USING LEFT INTERNAL MAMMARY ARTERY AND ENDOSCOPICALLY HARVESTED RIGHT GREATER SAPHENOUS VEIN;  Surgeon: Linden Dolin, MD;  Location: [facility] OR;  Service: Open Heart Surgery;  Laterality: N/A;   LEFT HEART CATH AND CORONARY ANGIOGRAPHY N/A 11/18/2019   Procedure: LEFT HEART CATH AND CORONARY ANGIOGRAPHY;  Surgeon: Corky Crafts, MD;  Location: [facility] INVASIVE CV LAB;  Service: Cardiovascular;  Laterality: N/A;   TEE WITHOUT CARDIOVERSION N/A 11/20/2019   Procedure: TRANSESOPHAGEAL ECHOCARDIOGRAM (TEE);  Surgeon: Linden Dolin, MD;  [facility];  Service: Open Heart Surgery;  Laterality: N/A;    Family History  Problem Relation Age of Onset   Stroke Mother    Stroke Father    Heart attack Father    Cancer Brother    Social History   Socioeconomic History   Marital status: Single    Spouse name: Not on file   Number of children: Not on file   Years of education: Not on file   Highest education level: Not on file  Occupational History   Occupation: retired  Tobacco Use   Smoking status: Never   Smokeless tobacco: Never  Vaping Use   Vaping status: Not on file  Substance and Sexual Activity   Alcohol use: Never   Drug use: Never   Sexual activity: Not Currently  Other Topics Concern   Not on file  Social History Narrative   ** Merged History Encounter **       Social Determinants of Health   Financial Resource Strain: Low Risk  (05/29/2023)   Overall Financial Resource Strain (CARDIA)    Difficulty of Paying Living Expenses: Not hard at all  Food Insecurity: No Food Insecurity (05/29/2023)   Hunger Vital Sign    Worried About Running Out of Food in the Last Year: Never true    Ran Out of Food in the Last Year: Never true  Transportation Needs: No Transportation Needs (05/29/2023)   PRAPARE - Administrator, Civil Service (Medical): No    Lack of Transportation (Non-Medical): No  Physical Activity: Insufficiently Active (05/29/2023)   Exercise Vital Sign    Days of Exercise per Week: 3 days    Minutes of Exercise per Session: 20 min  Stress: No Stress Concern Present (05/29/2023)   [employer] of Occupational Health - Occupational Stress Questionnaire    Feeling of Stress : Not at all  Social Connections: Socially Isolated (05/29/2023)   Social Connection and Isolation Panel [NHANES]    Frequency of Communication with Friends and Family: More than three times a week    Frequency of Social Gatherings with Friends and Family: More than three times a week    Attends Religious Services: Never    Database administrator or Organizations: No     Attends Engineer, structural: Never    Marital Status: Never married    Tobacco Counseling Counseling given: Not Answered   Clinical Intake:  Pre-visit preparation completed: Yes  Pain : No/denies pain     Nutritional Status: BMI of 19-24  Normal Nutritional Risks: None Diabetes: No  How often do you need to have someone help you when you read instructions, pamphlets, or other written materials from your doctor or pharmacy?: 5 - Always  Interpreter Needed?: No  Information entered by :: NAllen LPN   Activities of Daily Living    05/29/2023  8:32 AM  In your present state of health, do you have any difficulty performing the following activities:  Hearing? 1  Comment needs new hearing aids  Vision? 0  Difficulty concentrating or making decisions? 0  Walking or climbing stairs? 1  Dressing or bathing? 0  Doing errands, shopping? 1  Comment always has someone with him  Preparing Food and eating ? N  Using the Toilet? N  In the past six months, have you accidently leaked urine? N  Do you have problems with loss of bowel control? N  Managing your Medications? Y  Managing your Finances? Y  Housekeeping or managing your Housekeeping? Y    Patient Care Team: [name], FNP as PCP - General (General Practice) Lyn Records, MD (Inactive) as PCP - Cardiology (Cardiology) Quintella Reichert, MD as PCP - Sleep Medicine (Cardiology) Mateo Flow, MD as Consulting Physician (Ophthalmology) [facility], Dory Peru, MD as Attending Physician Jeani Hawking, MD as Consulting Physician (Gastroenterology) [name], FNP (General Practice)  Indicate any recent Medical Services you may have received from other than [facility] providers in the past year (date may be approximate).     Assessment:   This is a routine wellness examination for [name].  Hearing/Vision screen Hearing Screening - Comments:: Needs new hearing aids Vision Screening - Comments::  Regular eye exams, [facility]   Goals Addressed             This Visit's Progress    Patient Stated       05/29/2023, eat healthy and drink water       Depression Screen    05/29/2023    8:44 AM 09/03/2022    3:29 PM 03/21/2022    4:00 PM 02/23/2021   10:07 AM 02/18/2020   11:27 AM 02/17/2019   11:23 AM 11/18/2018    3:02 PM  PHQ 2/9 Scores  PHQ - 2 Score 0 0 0 0 0 0 0  PHQ- 9 Score      0     Fall Risk    05/29/2023    8:42 AM 05/06/2023    9:35 AM 09/03/2022    3:28 PM 03/21/2022    4:00 PM 02/23/2021   10:07 AM  Fall Risk   Falls in the past year? 1 1 1  0 0  Comment slipped on back porch      Number falls in past yr: 0 0 0 0   Injury with Fall? 1 1 0 0   Comment bruised up face      Risk for fall due to : History of fall(s);Medication side effect History of fall(s);Impaired balance/gait;Impaired mobility No Fall Risks Impaired balance/gait;Impaired mobility;Medication side effect Impaired balance/gait;Impaired mobility;Medication side effect  Follow up Falls prevention discussed;Falls evaluation completed Falls evaluation completed;Education provided Falls evaluation completed Falls evaluation completed;Education provided;Falls prevention discussed Falls evaluation completed;Education provided;Falls prevention discussed    MEDICARE RISK AT HOME: Medicare Risk at Home Any stairs in or around the home?: Yes (has a ramp) If so, are there any without handrails?: No Home free of loose throw rugs in walkways, pet beds, electrical cords, etc?: Yes Adequate lighting in your home to reduce risk of falls?: Yes Life alert?: No Use of a cane, walker or w/c?: Yes Grab bars in the bathroom?: Yes Shower chair or bench in shower?: Yes Elevated toilet seat or a handicapped toilet?: No  TIMED UP AND GO:  Was the test performed?  Yes  Length of time to ambulate 10  feet: 7 sec Gait slow and steady with assistive device    Cognitive Function:  6 CIT not administered.  Patient has difficulty with comprehension.        02/17/2019   11:30 AM 05/15/2018    3:17 PM  6CIT Screen  What Year? 4 points 4 points  What month? 3 points 0 points  What time? 0 points 3 points  Count back from 20 4 points 4 points  Months in reverse 4 points 4 points  Repeat phrase 10 points 10 points  Total Score 25 points 25 points    Immunizations Immunization History  Administered Date(s) Administered   Fluad Trivalent(High Dose 65+) 05/06/2023   Influenza, High Dose Seasonal PF 06/28/2018   Influenza-Unspecified 03/24/2018, 11/28/2021   Moderna SARS-COV2 Booster Vaccination 07/25/2021   Moderna Sars-Covid-2 Vaccination 12/10/2019, 01/12/2020, 08/20/2020   Pneumococcal Conjugate-13 09/18/2017   Tdap 06/22/2013, 11/03/2019    TDAP status: Up to date  Flu Vaccine status: Up to date  Pneumococcal vaccine status: Up to date  Covid-19 vaccine status: Information provided on how to obtain vaccines.   Qualifies for Shingles Vaccine? Yes   Zostavax completed No   Shingrix Completed?: No.    Education has been provided regarding the importance of this vaccine. Patient has been advised to call insurance company to determine out of pocket expense if they have not yet received this vaccine. Advised may also receive vaccine at local pharmacy or Health Dept. Verbalized acceptance and understanding.  Screening Tests Health Maintenance  Topic Date Due   Zoster Vaccines- Shingrix (1 of 2) Never done   Pneumonia Vaccine 4+ Years old (2 of 2 - PPSV23 or PCV20) 09/18/2018   COVID-19 Vaccine (5 - 2023-24 season) 04/07/2023   Medicare Annual Wellness (AWV)  05/28/2024   DTaP/Tdap/Td (3 - Td or Tdap) 11/02/2029   INFLUENZA VACCINE  Completed   HPV VACCINES  Aged Out    Health Maintenance  Health Maintenance Due  Topic Date Due   Zoster Vaccines- Shingrix (1 of 2) Never done   Pneumonia Vaccine 88+ Years old (2 of 2 - PPSV23 or PCV20) 09/18/2018   COVID-19 Vaccine (5 -  2023-24 season) 04/07/2023    Colorectal cancer screening: No longer required.   Lung Cancer Screening: (Low Dose CT Chest recommended if Age 58-80 years, 20 pack-year currently smoking OR have quit w/in 15years.) does not qualify.   Lung Cancer Screening Referral: no  Additional Screening:  Hepatitis C Screening: does not qualify;   Vision Screening: Recommended annual ophthalmology exams for early detection of glaucoma and other disorders of the eye. Is the patient up to date with their annual eye exam?  Yes  Who is the provider or what is the name of the office in which the patient attends annual eye exams? [facility] If pt is not established with a provider, would they like to be referred to a provider to establish care? No .   Dental Screening: Recommended annual dental exams for proper oral hygiene  Diabetic Foot Exam: n/a  Community Resource Referral / Chronic Care Management: CRR required this visit?  No   CCM required this visit?  No     Plan:     I have personally reviewed and noted the following in the patient's chart:   Medical and social history Use of alcohol, tobacco or illicit drugs  Current medications and supplements including opioid prescriptions. Patient is not currently taking opioid prescriptions. Functional ability and status Nutritional status Physical activity Advanced  directives List of other physicians Hospitalizations, surgeries, and ER visits in previous 12 months Vitals Screenings to include cognitive, depression, and falls Referrals and appointments  In addition, I have reviewed and discussed with patient certain preventive protocols, quality metrics, and best practice recommendations. A written personalized care plan for preventive services as well as general preventive health recommendations were provided to patient.     Barb Merino, LPN   16/05/9603   After Visit Summary: (In Person-Printed) AVS printed and given to the  patient  Nurse Notes: none

## 2023-05-29 NOTE — Progress Notes (Signed)
[name], CMA,acting as a Neurosurgeon for [name], FNP.,have documented all relevant documentation on the behalf of [name], FNP,as directed by  [name], FNP while in the presence of [name], FNP.  Subjective:  Patient ID: [name] , male    DOB: 11-10-1935 , 87 y.o.   MRN: [id number]  Chief Complaint  Patient presents with   Bleeding/Bruising    HPI  Patient presents today for his right hand, he reports he is having a lot of discoloration. It appears to be bruised.  Patient reports compliance with medication. Patient denies any chest pain, SOB, or headaches. Patient is with "[name]" his nephew who is also his POA. He has an appt on Nov 8th with the [facility].   BP Readings from Last 3 Encounters: 05/29/23 : 124/70 05/29/23 : 124/70 05/06/23 : (!) 140/60        Past Medical History:  Diagnosis Date   Arthritis    Bundle branch block    Hypercholesteremia    Hyperlipidemia 2001   Hypertension 2001   Hypertension    Peripheral vascular disease (HCC)    Stroke (HCC)    Stroke (HCC)    2001 - R side deficits     Family History  Problem Relation Age of Onset   Stroke Mother    Stroke Father    Heart attack Father    Cancer Brother      Current Outpatient Medications:    acetaminophen (TYLENOL) 325 MG tablet, Take 2 tablets (650 mg total) by mouth every 4 (four) hours as needed for headache or mild pain., Disp:  , Rfl:    amLODipine (NORVASC) 2.5 MG tablet, Take 1 tablet (2.5 mg total) by mouth daily., Disp: 90 tablet, Rfl: 0   aspirin EC 81 MG tablet, Take 81 mg by mouth daily., Disp: , Rfl:    cetirizine (ZYRTEC) 10 MG tablet, Take 10 mg by mouth as needed for allergies., Disp: , Rfl:    Cholecalciferol (VITAMIN D) 50 MCG (2000 UT) tablet, Take 2,000 Units by mouth daily., Disp: , Rfl:    clopidogrel (PLAVIX) 75 MG tablet, Take 1 tablet (75 mg total) by mouth daily., Disp: 90 tablet, Rfl: 1   fluticasone (FLONASE) 50 MCG/ACT nasal spray,  Place 2 sprays into both nostrils daily., Disp: 16 g, Rfl: 2   Misc Natural Products (ELDERBERRY IMMUNE COMPLEX PO), Take by mouth daily., Disp: , Rfl:    montelukast (SINGULAIR) 10 MG tablet, TAKE 1 TABLET BY MOUTH DAILY, Disp: 90 tablet, Rfl: 1   polyethylene glycol (MIRALAX / GLYCOLAX) 17 g packet, Take 17 g by mouth daily. (Patient taking differently: Take 17 g by mouth daily as needed.), Disp: 14 each, Rfl: 0   rosuvastatin (CRESTOR) 20 MG tablet, TAKE 1 TABLET(20 MG) BY MOUTH DAILY, Disp: 90 tablet, Rfl: 1   tamsulosin (FLOMAX) 0.4 MG CAPS capsule, Take 1 capsule (0.4 mg total) by mouth daily., Disp: 90 capsule, Rfl: 1   zinc gluconate 50 MG tablet, Take 50 mg by mouth daily., Disp: , Rfl:    No Known Allergies   Review of Systems  Constitutional: Negative.   Respiratory: Negative.  Negative for shortness of breath and wheezing.   Cardiovascular: Negative.  Negative for chest pain and palpitations.  Gastrointestinal: Negative.   Neurological: Negative.  Negative for headaches.  Psychiatric/Behavioral: Negative.       Today's Vitals   05/29/23 0842  BP: 124/70  Pulse: 61  Temp: 97.9 F (  36.6 C)  TempSrc: Oral  Weight: 167 lb (75.8 kg)  Height: 5\' 9"  (1.753 m)   Body mass index is 24.66 kg/m.  Wt Readings from Last 3 Encounters:  05/29/23 167 lb (75.8 kg)  05/29/23 167 lb 12.8 oz (76.1 kg)  05/06/23 173 lb (78.5 kg)     Objective:  Physical Exam Vitals reviewed.  Constitutional:      General: He is not in acute distress.    Appearance: Normal appearance.  Cardiovascular:     Rate and Rhythm: Normal rate and regular rhythm.     Pulses: Normal pulses.     Heart sounds: Normal heart sounds. No murmur heard. Pulmonary:     Effort: Pulmonary effort is normal. No respiratory distress.     Breath sounds: Normal breath sounds.  Musculoskeletal:     Comments: Wearing brace to right lower extremity and right arm hemiparesis  Skin:    General: Skin is warm.      Capillary Refill: Capillary refill takes less than 2 seconds.     Findings: Bruising (right anterior wrist with bruising, non blanchable) present.  Neurological:     General: No focal deficit present.     Mental Status: He is alert and oriented to person, place, and time.     Cranial Nerves: No cranial nerve deficit.  Psychiatric:        Mood and Affect: Mood normal.        Behavior: Behavior normal.        Thought Content: Thought content normal.        Judgment: Judgment normal.         Assessment And Plan:  Bruise Assessment & Plan: Slightly dark purple colored bruise to anterior hand, non-tender and non blanching. No concerns at this time.    Need for COVID-19 vaccine -     Pfizer Comirnaty Covid-19 Vaccine 62yrs & older  Immunization due Assessment & Plan: Pneumonia 23 given in office.  Orders: -     Pneumococcal polysaccharide vaccine 23-valent greater than or equal to 2yo subcutaneous/IM    Return for keep same next.  Patient was given opportunity to ask questions. Patient verbalized understanding of the plan and was able to repeat key elements of the plan. All questions were answered to their satisfaction.    Jeanell Sparrow, FNP, have reviewed all documentation for this visit. The documentation on 05/29/23 for the exam, diagnosis, procedures, and orders are all accurate and complete.   IF YOU HAVE BEEN REFERRED TO A SPECIALIST, IT MAY TAKE 1-2 WEEKS TO SCHEDULE/PROCESS THE REFERRAL. IF YOU HAVE NOT HEARD FROM US/SPECIALIST IN TWO WEEKS, PLEASE GIVE [place] A CALL AT [phone] X [phone].

## 2023-05-29 NOTE — Patient Instructions (Signed)
[name] , Thank you for taking time to come for your Medicare Wellness Visit. I appreciate your ongoing commitment to your health goals. Please review the following plan we discussed and let me know if I can assist you in the future.   Referrals/Orders/Follow-Ups/Clinician Recommendations: none  This is a list of the screening recommended for you and due dates:  Health Maintenance  Topic Date Due   Zoster (Shingles) Vaccine (1 of 2) Never done   Pneumonia Vaccine (2 of 2 - PPSV23 or PCV20) 09/18/2018   COVID-19 Vaccine (5 - 2023-24 season) 04/07/2023   Medicare Annual Wellness Visit  05/28/2024   DTaP/Tdap/Td vaccine (3 - Td or Tdap) 11/02/2029   Flu Shot  Completed   HPV Vaccine  Aged Out    Advanced directives: (Copy Requested) Please bring a copy of your health care power of attorney and living will to the office to be added to your chart at your convenience.  Next Medicare Annual Wellness Visit scheduled for next year: No, office will schedule  insert Preventive Care attachment Insert FALL PREVENTION attachment if needed

## 2023-06-05 ENCOUNTER — Encounter: Payer: Self-pay | Admitting: Podiatry

## 2023-06-05 ENCOUNTER — Ambulatory Visit: Payer: Medicare PPO | Admitting: Podiatry

## 2023-06-05 DIAGNOSIS — Q828 Other specified congenital malformations of skin: Secondary | ICD-10-CM | POA: Diagnosis not present

## 2023-06-05 DIAGNOSIS — B351 Tinea unguium: Secondary | ICD-10-CM | POA: Diagnosis not present

## 2023-06-05 DIAGNOSIS — L909 Atrophic disorder of skin, unspecified: Secondary | ICD-10-CM | POA: Diagnosis not present

## 2023-06-05 DIAGNOSIS — M79674 Pain in right toe(s): Secondary | ICD-10-CM | POA: Diagnosis not present

## 2023-06-05 DIAGNOSIS — M79675 Pain in left toe(s): Secondary | ICD-10-CM

## 2023-06-05 NOTE — Progress Notes (Signed)
This patient presents to the office with chief complaint of long thick painful nails.  Patient says the nails are painful walking and wearing shoes.  This patient is unable to self treat.  This patient is unable to trim her nails since she is unable to reach her nails.  He also has painful callus left forefoot. he presents to the office for preventative foot care services.  General Appearance  Alert, conversant and in no acute stress.  Vascular  Dorsalis pedis and posterior tibial  pulses are weakly  palpable  bilaterally.  Capillary return is within normal limits  bilaterally. Temperature is within normal limits  bilaterally.  Neurologic  Senn-Weinstein monofilament wire test within normal limits  bilaterally. Muscle power within normal limits bilaterally.  Nails Thick disfigured discolored nails with subungual debris  from hallux to fifth toes bilaterally. No evidence of bacterial infection or drainage bilaterally.  Orthopedic  No limitations of motion  feet .  No crepitus or effusions noted.  No bony pathology or digital deformities noted.  Skin  normotropic skin with no porokeratosis noted bilaterally.  No signs of infections or ulcers noted.   Porokeratosis sub 2 left foot.  Onychomycosis  Nails  B/L.  Pain in right toes  Pain in left toes  Porokeratosis  left forefoot.  Debridement of nails both feet followed trimming the nails with dremel tool.  Debridement left foot porokeratosis sub 2 left foot.   RTC 4 months.   Helane Gunther DPM

## 2023-06-05 NOTE — Assessment & Plan Note (Signed)
Slightly dark purple colored bruise to anterior hand, non-tender and non blanching. No concerns at this time.

## 2023-06-05 NOTE — Assessment & Plan Note (Signed)
Pneumonia 23 given in office.

## 2023-06-05 NOTE — Assessment & Plan Note (Signed)
Covid 19 vaccine given in office observed for 15 minutes without any adverse reaction  

## 2023-06-11 DIAGNOSIS — H40013 Open angle with borderline findings, low risk, bilateral: Secondary | ICD-10-CM | POA: Diagnosis not present

## 2023-06-12 ENCOUNTER — Other Ambulatory Visit: Payer: Self-pay | Admitting: Nurse Practitioner

## 2023-06-28 DIAGNOSIS — M21371 Foot drop, right foot: Secondary | ICD-10-CM | POA: Diagnosis not present

## 2023-07-22 ENCOUNTER — Other Ambulatory Visit: Payer: Self-pay | Admitting: Nurse Practitioner

## 2023-07-22 DIAGNOSIS — I10 Essential (primary) hypertension: Secondary | ICD-10-CM

## 2023-08-05 ENCOUNTER — Encounter: Payer: Medicare PPO | Admitting: Nurse Practitioner

## 2023-08-19 DIAGNOSIS — M79671 Pain in right foot: Secondary | ICD-10-CM | POA: Diagnosis not present

## 2023-08-19 DIAGNOSIS — M7751 Other enthesopathy of right foot: Secondary | ICD-10-CM | POA: Diagnosis not present

## 2023-08-19 DIAGNOSIS — M71571 Other bursitis, not elsewhere classified, right ankle and foot: Secondary | ICD-10-CM | POA: Diagnosis not present

## 2023-08-27 DIAGNOSIS — M7751 Other enthesopathy of right foot: Secondary | ICD-10-CM | POA: Diagnosis not present

## 2023-08-27 DIAGNOSIS — M25571 Pain in right ankle and joints of right foot: Secondary | ICD-10-CM | POA: Diagnosis not present

## 2023-08-28 ENCOUNTER — Encounter: Payer: Self-pay | Admitting: Nurse Practitioner

## 2023-08-28 ENCOUNTER — Ambulatory Visit: Payer: Medicare PPO | Admitting: Nurse Practitioner

## 2023-08-28 VITALS — BP 136/60 | HR 70 | Temp 98.7°F | Ht 69.0 in | Wt 177.6 lb

## 2023-08-28 DIAGNOSIS — E663 Overweight: Secondary | ICD-10-CM

## 2023-08-28 DIAGNOSIS — Z23 Encounter for immunization: Secondary | ICD-10-CM

## 2023-08-28 DIAGNOSIS — I10 Essential (primary) hypertension: Secondary | ICD-10-CM | POA: Diagnosis not present

## 2023-08-28 DIAGNOSIS — I480 Paroxysmal atrial fibrillation: Secondary | ICD-10-CM | POA: Diagnosis not present

## 2023-08-28 DIAGNOSIS — I272 Pulmonary hypertension, unspecified: Secondary | ICD-10-CM | POA: Diagnosis not present

## 2023-08-28 DIAGNOSIS — Z79899 Other long term (current) drug therapy: Secondary | ICD-10-CM | POA: Diagnosis not present

## 2023-08-28 DIAGNOSIS — G8111 Spastic hemiplegia affecting right dominant side: Secondary | ICD-10-CM

## 2023-08-28 DIAGNOSIS — Z0000 Encounter for general adult medical examination without abnormal findings: Secondary | ICD-10-CM | POA: Diagnosis not present

## 2023-08-28 DIAGNOSIS — E782 Mixed hyperlipidemia: Secondary | ICD-10-CM

## 2023-08-28 DIAGNOSIS — Z6826 Body mass index (BMI) 26.0-26.9, adult: Secondary | ICD-10-CM | POA: Diagnosis not present

## 2023-08-28 DIAGNOSIS — R7303 Prediabetes: Secondary | ICD-10-CM | POA: Diagnosis not present

## 2023-08-28 LAB — POCT URINALYSIS DIP (CLINITEK)
Bilirubin, UA: NEGATIVE
Glucose, UA: NEGATIVE mg/dL
Ketones, POC UA: NEGATIVE mg/dL
Leukocytes, UA: NEGATIVE
Nitrite, UA: NEGATIVE
POC PROTEIN,UA: 30 — AB
Spec Grav, UA: 1.01 (ref 1.010–1.025)
Urobilinogen, UA: 0.2 U/dL
pH, UA: 5.5 (ref 5.0–8.0)

## 2023-08-28 MED ORDER — SHINGRIX 50 MCG/0.5ML IM SUSR
0.5000 mL | Freq: Once | INTRAMUSCULAR | 1 refills | Status: AC
Start: 2023-08-28 — End: 2023-08-28

## 2023-08-28 NOTE — Assessment & Plan Note (Signed)
He has not seen Cardiology in about 2 years, I have given him and his nephew the phone number to call for an appt.

## 2023-08-28 NOTE — Patient Instructions (Addendum)
Dr. Vassie [facility] - Cardiology [address] [place], [place], [place]  [address]/ [address] [place], [place] Phone: [phone]; Fax: [phone]  [phone]  Health Maintenance  Topic Date Due   Zoster (Shingles) Vaccine (1 of 2) Never done   COVID-19 Vaccine (5 - 2024-25 season) 07/24/2023   Medicare Annual Wellness Visit  05/28/2024   DTaP/Tdap/Td vaccine (3 - Td or Tdap) 11/02/2029   Pneumonia Vaccine  Completed   Flu Shot  Completed   HPV Vaccine  Aged Out   I have sent a Rx for the Shingrix to the pharmacy.

## 2023-08-28 NOTE — Assessment & Plan Note (Signed)
Continue statin, tolerating well 

## 2023-08-28 NOTE — Assessment & Plan Note (Addendum)
B/P is elevated slightly elevated, encouraged to focus on lifestyle modifications  CMP ordered to check renal function.  EKG done no changes from previous

## 2023-08-28 NOTE — Progress Notes (Signed)
[name], CMA,acting as a Neurosurgeon for William Felts, FNP.,have documented all relevant documentation on the behalf of William Felts, FNP,as directed by  William Felts, FNP while in the presence of William Felts, FNP.  Subjective:   Patient ID: [name] , male    DOB: 1936-03-12 , 88 y.o.   MRN: [id number]  Chief Complaint  Patient presents with   Annual Exam    HPI  Patient presents today for HM, Patient reports compliance with medication. Patient denies any chest pain, SOB, or headaches. Patient has no concerns today. He is here today with his nephew. He had an injection (cortisone) to his right foot by Dr. Tressa Busman, he has an appt in February for foot nail care  Hypertension This is a chronic problem. The current episode started more than 1 year ago. The problem is unchanged. The problem is controlled. Pertinent negatives include no anxiety, blurred vision, chest pain, headaches, malaise/fatigue, palpitations, peripheral edema or shortness of breath. There are no associated agents to hypertension. Risk factors for coronary artery disease include sedentary lifestyle. There is no history of kidney disease. There is no history of chronic renal disease.     Past Medical History:  Diagnosis Date   Arthritis    Bundle branch block    Hypercholesteremia    Hyperlipidemia 2001   Hypertension 2001   Hypertension    Peripheral vascular disease (HCC)    Stroke (HCC)    Stroke (HCC)    2001 - R side deficits     Family History  Problem Relation Age of Onset   Stroke Mother    Stroke Father    Heart attack Father    Cancer Brother      Current Outpatient Medications:    acetaminophen (TYLENOL) 325 MG tablet, Take 2 tablets (650 mg total) by mouth every 4 (four) hours as needed for headache or mild pain., Disp:  , Rfl:    amLODipine (NORVASC) 2.5 MG tablet, TAKE 1 TABLET(2.5 MG) BY MOUTH DAILY, Disp: 90 tablet, Rfl: 0   aspirin EC 81 MG tablet, Take 81 mg by mouth daily., Disp:  , Rfl:    cetirizine (ZYRTEC) 10 MG tablet, Take 10 mg by mouth as needed for allergies., Disp: , Rfl:    Cholecalciferol (VITAMIN D) 50 MCG (2000 UT) tablet, Take 2,000 Units by mouth daily., Disp: , Rfl:    clopidogrel (PLAVIX) 75 MG tablet, Take 1 tablet (75 mg total) by mouth daily., Disp: 90 tablet, Rfl: 1   fluticasone (FLONASE) 50 MCG/ACT nasal spray, Place 2 sprays into both nostrils daily., Disp: 16 g, Rfl: 2   Misc Natural Products (ELDERBERRY IMMUNE COMPLEX PO), Take by mouth daily., Disp: , Rfl:    montelukast (SINGULAIR) 10 MG tablet, TAKE 1 TABLET BY MOUTH DAILY, Disp: 90 tablet, Rfl: 1   polyethylene glycol (MIRALAX / GLYCOLAX) 17 g packet, Take 17 g by mouth daily., Disp: 14 each, Rfl: 0   rosuvastatin (CRESTOR) 20 MG tablet, TAKE 1 TABLET(20 MG) BY MOUTH DAILY, Disp: 90 tablet, Rfl: 1   tamsulosin (FLOMAX) 0.4 MG CAPS capsule, Take 1 capsule (0.4 mg total) by mouth daily., Disp: 90 capsule, Rfl: 1   zinc gluconate 50 MG tablet, Take 50 mg by mouth daily., Disp: , Rfl:    No Known Allergies   Men's preventive visit. Patient Health Questionnaire (PHQ-2) is  Flowsheet Row Office Visit from 08/28/2023 in [facility]  PHQ-2 Total Score 0  Patient is on a Regular diet, has a good appetite.  Exercising - minimal - will walk around the house.  Marital status: Single. Relevant history for alcohol use is:  Social History   Substance and Sexual Activity  Alcohol Use Never   Relevant history for tobacco use is:  Social History   Tobacco Use  Smoking Status Never  Smokeless Tobacco Never  .   Review of Systems  Constitutional: Negative.  Negative for chills, fever and malaise/fatigue.  HENT: Negative.    Eyes: Negative.  Negative for blurred vision.  Respiratory: Negative.  Negative for shortness of breath.   Cardiovascular: Negative.  Negative for chest pain and palpitations.  Gastrointestinal: Negative.  Negative for constipation,  diarrhea and nausea.  Endocrine: Negative.  Negative for cold intolerance and heat intolerance.  Genitourinary: Negative.  Negative for dysuria and frequency.  Musculoskeletal:  Negative for joint swelling.       Right hemiparesis   Skin: Negative.   Allergic/Immunologic: Negative.   Neurological:  Negative for weakness and headaches.       History of stroke.   Hematological: Negative.   Psychiatric/Behavioral: Negative.       Today's Vitals   08/28/23 1423  BP: 136/60  Pulse: 70  Temp: 98.7 F (37.1 C)  TempSrc: Oral  Weight: 177 lb 9.6 oz (80.6 kg)  Height: 5\' 9"  (1.753 m)  PainSc: 0-No pain   Body mass index is 26.23 kg/m.  Wt Readings from Last 3 Encounters:  08/28/23 177 lb 9.6 oz (80.6 kg)  05/29/23 167 lb (75.8 kg)  05/29/23 167 lb 12.8 oz (76.1 kg)    Objective:  Physical Exam Vitals reviewed.  Constitutional:      General: He is not in acute distress.    Appearance: Normal appearance.  HENT:     Head: Normocephalic.     Right Ear: Tympanic membrane, ear canal and external ear normal. There is no impacted cerumen.     Left Ear: Tympanic membrane, ear canal and external ear normal. There is no impacted cerumen.     Ears:     Comments: Hard of hearing     Nose: Nose normal. No congestion.  Eyes:     Pupils: Pupils are equal, round, and reactive to light.  Cardiovascular:     Rate and Rhythm: Normal rate and regular rhythm.     Pulses: Normal pulses.     Heart sounds: Normal heart sounds. No murmur heard. Pulmonary:     Effort: Pulmonary effort is normal.     Breath sounds: No wheezing, rhonchi or rales.  Abdominal:     General: Abdomen is flat. Bowel sounds are normal.     Palpations: Abdomen is soft.     Tenderness: There is no abdominal tenderness. There is no guarding.  Musculoskeletal:     Cervical back: Normal range of motion and neck supple. No rigidity or tenderness.     Comments: Wearing a right foot brace and brace on right arm due to hx of  stroke. Right hemiparesis   Skin:    General: Skin is warm and dry.     Comments: Healed surgical scar to midline chest  Neurological:     Mental Status: He is alert and oriented to person, place, and time.     Cranial Nerves: No facial asymmetry.     Motor: Weakness present.     Gait: Gait abnormal.     Comments: Walks with a cane   Psychiatric:  Mood and Affect: Mood normal.        Behavior: Behavior normal.        Judgment: Judgment normal.         Assessment And Plan:    Encounter for annual health examination Assessment & Plan: Behavior modifications discussed and diet history reviewed.   Pt will continue to exercise regularly and modify diet with low GI, plant based foods and decrease intake of processed foods.  Recommend intake of daily multivitamin, Vitamin D, and calcium.  Recommend for preventive screenings, as well as recommend immunizations that include influenza, TDAP, and Shingles    Encounter for herpes zoster vaccination Assessment & Plan: Checked transrx and unable to get here, sent Rx to pharmacy  Orders: -     Shingrix; Inject 0.5 mLs into the muscle once for 1 dose. Repeat in 2-6 months  Dispense: 1 each; Refill: 1  Overweight with body mass index (BMI) of 26 to 26.9 in adult  Prediabetes Assessment & Plan: HgbA1c increased slightly at last visit, encouraged to continue to eat a diet low in sugar.   Orders: -     Hemoglobin A1c  Mixed hyperlipidemia Assessment & Plan: Continue statin, tolerating well.   Orders: -     Lipid panel  Right spastic hemiplegia (HCC) Assessment & Plan: Continue wearing wrist splint, discussed putting a small towel inside right hand to prevent skin breakdown and to keep his nails cut short   Pulmonary hypertension, unspecified (HCC) Assessment & Plan: B/P is elevated slightly elevated, encouraged to focus on lifestyle modifications  CMP ordered to check renal function.  EKG done no changes from  previous  Orders: -     EKG 12-Lead -     POCT URINALYSIS DIP (CLINITEK) -     Microalbumin / creatinine urine ratio -     CMP14+EGFR  Paroxysmal atrial fibrillation (HCC) Assessment & Plan: He has not seen Cardiology in about 2 years, I have given him and his nephew the phone number to call for an appt.    Other long term (current) drug therapy -     CBC with Differential/Platelet    Return for 1 year physical, 6 month bp check. Patient was given opportunity to ask questions. Patient verbalized understanding of the plan and was able to repeat key elements of the plan. All questions were answered to their satisfaction.   William Felts, FNP  I, William Felts, FNP, have reviewed all documentation for this visit. The documentation on 08/28/23 for the exam, diagnosis, procedures, and orders are all accurate and complete.

## 2023-08-28 NOTE — Assessment & Plan Note (Signed)
Continue wearing wrist splint, discussed putting a small towel inside right hand to prevent skin breakdown and to keep his nails cut short

## 2023-08-28 NOTE — Assessment & Plan Note (Signed)
HgbA1c increased slightly at last visit, encouraged to continue to eat a diet low in sugar.

## 2023-08-29 LAB — CMP14+EGFR
ALT: 53 [IU]/L — ABNORMAL HIGH (ref 0–44)
AST: 35 [IU]/L (ref 0–40)
Albumin: 4.8 g/dL — ABNORMAL HIGH (ref 3.7–4.7)
Alkaline Phosphatase: 78 [IU]/L (ref 44–121)
BUN/Creatinine Ratio: 12 (ref 10–24)
BUN: 11 mg/dL (ref 8–27)
Bilirubin Total: 1.2 mg/dL (ref 0.0–1.2)
CO2: 19 mmol/L — ABNORMAL LOW (ref 20–29)
Calcium: 9.9 mg/dL (ref 8.6–10.2)
Chloride: 105 mmol/L (ref 96–106)
Creatinine, Ser: 0.92 mg/dL (ref 0.76–1.27)
Globulin, Total: 2.7 g/dL (ref 1.5–4.5)
Glucose: 96 mg/dL (ref 70–99)
Potassium: 4.2 mmol/L (ref 3.5–5.2)
Sodium: 141 mmol/L (ref 134–144)
Total Protein: 7.5 g/dL (ref 6.0–8.5)
eGFR: 82 mL/min/{1.73_m2} (ref >59)

## 2023-08-29 LAB — CBC WITH DIFFERENTIAL/PLATELET
Basophils Absolute: 0 10*3/uL (ref 0.0–0.2)
Basos: 0 %
EOS (ABSOLUTE): 0.1 10*3/uL (ref 0.0–0.4)
Eos: 1 %
Hematocrit: 40.7 % (ref 37.5–51.0)
Hemoglobin: 14 g/dL (ref 13.0–17.7)
Immature Grans (Abs): 0.1 10*3/uL (ref 0.0–0.1)
Immature Granulocytes: 1 %
Lymphocytes Absolute: 1.7 10*3/uL (ref 0.7–3.1)
Lymphs: 17 %
MCH: 33.5 pg — ABNORMAL HIGH (ref 26.6–33.0)
MCHC: 34.4 g/dL (ref 31.5–35.7)
MCV: 97 fL (ref 79–97)
Monocytes Absolute: 0.9 10*3/uL (ref 0.1–0.9)
Monocytes: 9 %
Neutrophils Absolute: 6.9 10*3/uL (ref 1.4–7.0)
Neutrophils: 72 %
Platelets: 278 10*3/uL (ref 150–450)
RBC: 4.18 x10E6/uL (ref 4.14–5.80)
RDW: 16.2 % — ABNORMAL HIGH (ref 11.6–15.4)
WBC: 9.6 10*3/uL (ref 3.4–10.8)

## 2023-08-29 LAB — LIPID PANEL
Chol/HDL Ratio: 2.3 {ratio} (ref 0.0–5.0)
Cholesterol, Total: 144 mg/dL (ref 100–199)
HDL: 62 mg/dL (ref >39)
LDL Chol Calc (NIH): 66 mg/dL (ref 0–99)
Triglycerides: 82 mg/dL (ref 0–149)
VLDL Cholesterol Cal: 16 mg/dL (ref 5–40)

## 2023-08-29 LAB — HEMOGLOBIN A1C
Est. average glucose Bld gHb Est-mCnc: 126 mg/dL
Hgb A1c MFr Bld: 6 % — ABNORMAL HIGH (ref 4.8–5.6)

## 2023-08-29 LAB — MICROALBUMIN / CREATININE URINE RATIO
Creatinine, Urine: 45.3 mg/dL
Microalb/Creat Ratio: 311 mg/g{creat} — ABNORMAL HIGH (ref 0–29)
Microalbumin, Urine: 141.1 ug/mL

## 2023-09-05 DIAGNOSIS — Z23 Encounter for immunization: Secondary | ICD-10-CM | POA: Insufficient documentation

## 2023-09-05 DIAGNOSIS — Z0000 Encounter for general adult medical examination without abnormal findings: Secondary | ICD-10-CM | POA: Insufficient documentation

## 2023-09-05 NOTE — Assessment & Plan Note (Signed)
Checked transrx and unable to get here, sent Rx to pharmacy

## 2023-09-05 NOTE — Assessment & Plan Note (Signed)
Behavior modifications discussed and diet history reviewed.   Pt will continue to exercise regularly and modify diet with low GI, plant based foods and decrease intake of processed foods.  Recommend intake of daily multivitamin, Vitamin D, and calcium.  Recommend for preventive screenings, as well as recommend immunizations that include influenza, TDAP, and Shingles

## 2023-09-13 DIAGNOSIS — L97512 Non-pressure chronic ulcer of other part of right foot with fat layer exposed: Secondary | ICD-10-CM | POA: Diagnosis not present

## 2023-09-13 DIAGNOSIS — I739 Peripheral vascular disease, unspecified: Secondary | ICD-10-CM | POA: Diagnosis not present

## 2023-09-27 ENCOUNTER — Other Ambulatory Visit: Payer: Self-pay | Admitting: Nurse Practitioner

## 2023-09-27 DIAGNOSIS — I10 Essential (primary) hypertension: Secondary | ICD-10-CM

## 2023-09-30 ENCOUNTER — Encounter: Payer: Self-pay | Admitting: Podiatry

## 2023-09-30 ENCOUNTER — Ambulatory Visit: Payer: Medicare PPO | Admitting: Podiatry

## 2023-09-30 DIAGNOSIS — L97511 Non-pressure chronic ulcer of other part of right foot limited to breakdown of skin: Secondary | ICD-10-CM

## 2023-09-30 DIAGNOSIS — I739 Peripheral vascular disease, unspecified: Secondary | ICD-10-CM

## 2023-09-30 MED ORDER — SILVER SULFADIAZINE 1 % EX CREA: 1.0000 | TOPICAL_CREAM | Freq: Every day | CUTANEOUS | 0 refills | Status: DC

## 2023-09-30 NOTE — Progress Notes (Signed)
  Subjective:  Patient ID: [name], male    DOB: 02/22/1936,   MRN: [id number]  Chief Complaint  Patient presents with   Nail Problem    RFC    88 y.o. male presents for concern of increased right foot pain and soreness. Relates the pain increased recently the last couple weeks on the bottom of his right foot. Also concern of thickened elongated and painful nails that are difficult to trim. Requesting to have them trimmed today. Patient has history of PAD and is on anticoagulants.   PCP:  [name], FNP    . Denies any other pedal complaints. Denies n/v/f/c.   Past Medical History:  Diagnosis Date   Arthritis    Bundle branch block    Hypercholesteremia    Hyperlipidemia 2001   Hypertension 2001   Hypertension    Peripheral vascular disease (HCC)    Stroke (HCC)    Stroke (HCC)    2001 - R side deficits    Objective:  Physical Exam: Vascular: DP/PT pulses 1/4 on left and no palpable on right. CFT <3 seconds. Absent hair growth on digits.  Xerosis noted bilaterally.  Skin. No lacerations or abrasions bilateral feet. Nails 1-5 bilateral  are thickened discolored and elongated with subungual debris. Upon debridement of hyperkeratotic lesion note plantar right fifth metatarsal small ulceration noted limited to breakdown of skin. No erythema edema or purulence noted. Measures about 0.1 cm x 0.2 cm x superficial Musculoskeletal: MMT 5/5 bilateral lower extremities in DF, PF, Inversion and Eversion. Deceased ROM in DF of ankle joint.  Neurological: Sensation intact to light touch. Protective sensation diminished bilateral.    Assessment:   1. Skin ulcer of fourth toe of right foot, limited to breakdown of skin (HCC)   2. PAD (peripheral artery disease) (HCC)      Plan:  Patient was evaluated and treated and all questions answered. Ulcer right plantar fifth metatarsal ulceration limited to breakdown of skin  -Debridement as below. -Dressed with silvadene,  DSD. -Silvadene sent to pharmacy.  -Reviewed vascular history and has follow-up in April. Advised to call to see if can come in soon since concern for difficulty healing ulceration given vascular history on right foot.  -Off-loading with regular shoe concern for safety with surgical shoe so padding added to insert to offload  -No abx indicated.  -Discussed glucose control and proper protein-rich diet.  -Discussed if any worsening redness, pain, fever or chills to call or may need to report to the emergency room. Patient expressed understanding.   Procedure: Excisional Debridement of Wound Rationale: Removal of overlying callus  Anesthesia: none Pre-Debridement Wound Measurements: Ovelrying callus  Post-Debridement Wound Measurements: 0.1 cm x 0.2 cm x 0.1 cm  Type of Debridement: Sharp Excisional Tissue Removed: Non-viable soft tissue Depth of Debridement: superficial dead skin Technique: Sharp excisional debridement to mildly bleeding, viable wound base.  Dressing: Dry, sterile, compression dressing. Disposition: Patient tolerated procedure well. Patient to return in 2 week for follow-up.  Return in about 2 weeks (around 10/14/2023) for wound check.   Louann Sjogren, DPM

## 2023-10-02 ENCOUNTER — Other Ambulatory Visit: Payer: Self-pay | Admitting: *Deleted

## 2023-10-02 DIAGNOSIS — I739 Peripheral vascular disease, unspecified: Secondary | ICD-10-CM

## 2023-10-04 ENCOUNTER — Ambulatory Visit: Payer: Medicare PPO | Admitting: Podiatry

## 2023-10-09 ENCOUNTER — Ambulatory Visit (HOSPITAL_COMMUNITY): Payer: Medicare PPO

## 2023-10-14 ENCOUNTER — Ambulatory Visit: Payer: Medicare PPO | Admitting: Podiatry

## 2023-10-14 ENCOUNTER — Encounter: Payer: Self-pay | Admitting: Podiatry

## 2023-10-14 DIAGNOSIS — I739 Peripheral vascular disease, unspecified: Secondary | ICD-10-CM | POA: Diagnosis not present

## 2023-10-14 DIAGNOSIS — L97511 Non-pressure chronic ulcer of other part of right foot limited to breakdown of skin: Secondary | ICD-10-CM

## 2023-10-14 NOTE — Progress Notes (Signed)
  Subjective:  Patient ID: [name], male    DOB: 01-19-1936,   MRN: [id number]  No chief complaint on file.   88 y.o. male presents for follow-up of right foot wound. Relates he has been dressing as instructed and doing well. Has ABIs scheduled for 3/13.   Patient has history of PAD and is on anticoagulants.   PCP:  [name], FNP    . Denies any other pedal complaints. Denies n/v/f/c.   Past Medical History:  Diagnosis Date   Arthritis    Bundle branch block    Hypercholesteremia    Hyperlipidemia 2001   Hypertension 2001   Hypertension    Peripheral vascular disease (HCC)    Stroke (HCC)    Stroke (HCC)    2001 - R side deficits    Objective:  Physical Exam: Vascular: DP/PT pulses 1/4 on left and no palpable on right. CFT <3 seconds. Absent hair growth on digits.  Xerosis noted bilaterally.  Skin. No lacerations or abrasions bilateral feet. Nails 1-5 bilateral  are thickened discolored and elongated with subungual debris. Upon debridement of hyperkeratotic lesion note plantar right fifth metatarsal small ulceration noted limited to breakdown of skin. No erythema edema or purulence noted. Measures about 0.1 cm x 0.2 cm x superficial Musculoskeletal: MMT 5/5 bilateral lower extremities in DF, PF, Inversion and Eversion. Deceased ROM in DF of ankle joint.  Neurological: Sensation intact to light touch. Protective sensation diminished bilateral.    Assessment:   1. Skin ulcer of plantar aspect of right foot, limited to breakdown of skin (HCC)   2. PAD (peripheral artery disease) (HCC)       Plan:  Patient was evaluated and treated and all questions answered. Ulcer right plantar fifth metatarsal ulceration limited to breakdown of skin  -Debridement over overlying callus underlying wound still present.  -Dressed with silvadene, DSD. -Silvadene to the area and padded bandage.  -Reviewed vascular history and has ABI on 3/13. Scheduled.  -Off-loading with regular  shoe concern for safety with surgical shoe so padding added to insert to offload  -No abx indicated.  -Discussed glucose control and proper protein-rich diet.  -Discussed if any worsening redness, pain, fever or chills to call or may need to report to the emergency room. Patient expressed understanding.    Patient to return in 2 week for follow-up.  Return in about 2 weeks (around 10/28/2023) for wound check.   Louann Sjogren, DPM

## 2023-10-17 ENCOUNTER — Ambulatory Visit (HOSPITAL_COMMUNITY)
Admission: RE | Admit: 2023-10-17 | Discharge: 2023-10-17 | Disposition: A | Discharge status: Home / Self Care | Source: Ambulatory Visit | Attending: Physician Assistant | Admitting: Physician Assistant

## 2023-10-17 DIAGNOSIS — I739 Peripheral vascular disease, unspecified: Secondary | ICD-10-CM | POA: Diagnosis not present

## 2023-10-17 LAB — VAS US ABI WITH/WO TBI
Left ABI: 0.99
Right ABI: 0.72

## 2023-10-28 DIAGNOSIS — M21611 Bunion of right foot: Secondary | ICD-10-CM | POA: Diagnosis not present

## 2023-10-28 DIAGNOSIS — M79671 Pain in right foot: Secondary | ICD-10-CM | POA: Diagnosis not present

## 2023-10-30 ENCOUNTER — Ambulatory Visit: Admitting: Podiatry

## 2023-10-30 ENCOUNTER — Encounter: Payer: Self-pay | Admitting: Podiatry

## 2023-10-30 ENCOUNTER — Telehealth: Payer: Self-pay | Admitting: Cardiology

## 2023-10-30 DIAGNOSIS — I739 Peripheral vascular disease, unspecified: Secondary | ICD-10-CM | POA: Diagnosis not present

## 2023-10-30 DIAGNOSIS — L97511 Non-pressure chronic ulcer of other part of right foot limited to breakdown of skin: Secondary | ICD-10-CM | POA: Diagnosis not present

## 2023-10-30 NOTE — Progress Notes (Signed)
  Subjective:  Patient ID: [name], male    DOB: June 25, 1936,   MRN: [id number]  No chief complaint on file.   88 y.o. male presents for follow-up of right foot wound. Relates he has been dressing as instructed and doing well. Relates still getting pain in the foot and has had ABIs.   Patient has history of PAD and is on anticoagulants.   PCP:  [name], FNP    . Denies any other pedal complaints. Denies n/v/f/c.   Past Medical History:  Diagnosis Date   Arthritis    Bundle branch block    Hypercholesteremia    Hyperlipidemia 2001   Hypertension 2001   Hypertension    Peripheral vascular disease (HCC)    Stroke (HCC)    Stroke (HCC)    2001 - R side deficits    Objective:  Physical Exam: Vascular: DP/PT pulses 1/4 on left and no palpable on right. CFT <3 seconds. Absent hair growth on digits.  Xerosis noted bilaterally.  Skin. No lacerations or abrasions bilateral feet. Nails 1-5 bilateral  are thickened discolored and elongated with subungual debris. Upon debridement of hyperkeratotic lesion note plantar right fifth metatarsal small ulceration noted limited to breakdown of skin. No erythema edema or purulence noted. Measures about 0.1 cm x 0.2 cm x superficial Musculoskeletal: MMT 5/5 bilateral lower extremities in DF, PF, Inversion and Eversion. Deceased ROM in DF of ankle joint.  Neurological: Sensation intact to light touch. Protective sensation diminished bilateral.   Summary:  Right: Resting right ankle-brachial index indicates moderate right lower  extremity arterial disease. The right toe-brachial index is abnormal.   Left: Resting left ankle-brachial index is within normal range. The left  toe-brachial index is normal.    Bilateral ABIs and TBIs appear increased compared to prior study on  11/05/2022.    Assessment:   1. Skin ulcer of plantar aspect of right foot, limited to breakdown of skin (HCC)   2. PAD (peripheral artery disease) (HCC)         Plan:  Patient was evaluated and treated and all questions answered. Ulcer right plantar fifth metatarsal ulceration healed.   -Debridement over overlying callus underlying wound healed.  -Reviewed vascular history and has ABI on 3/13. Scheduled.  -Off-loading with regular shoe concern for safety with surgical shoe so padding added to insert to offload  -No abx indicated.  -ABIS reviewed and show diminished results but improved from prior. Referral to vascular placed as pain is increasing in foot and near ulceration of foot. .  -Discussed glucose control and proper protein-rich diet.  -Discussed if any worsening redness, pain, fever or chills to call or may need to report to the emergency room. Patient expressed understanding.    Patient to return in 4 week for follow-up.  Return in about 4 weeks (around 11/27/2023) for wound check.   Louann Sjogren, DPM

## 2023-10-30 NOTE — Telephone Encounter (Signed)
 Caller Chrissie Noa) is following-up on patient's test results.

## 2023-10-30 NOTE — Telephone Encounter (Signed)
 Spoke with [name] needed results from PCP. Pt has not been see in our office seen 2022.

## 2023-11-10 ENCOUNTER — Other Ambulatory Visit: Payer: Self-pay | Admitting: Nurse Practitioner

## 2023-11-11 ENCOUNTER — Ambulatory Visit: Payer: Medicare PPO

## 2023-11-11 ENCOUNTER — Other Ambulatory Visit: Payer: Self-pay

## 2023-11-11 ENCOUNTER — Ambulatory Visit (INDEPENDENT_AMBULATORY_CARE_PROVIDER_SITE_OTHER): Admitting: Physician Assistant

## 2023-11-11 ENCOUNTER — Encounter (HOSPITAL_COMMUNITY): Payer: Medicare PPO

## 2023-11-11 VITALS — BP 147/66 | HR 58 | Temp 98.1°F | Ht 69.0 in | Wt 177.0 lb

## 2023-11-11 DIAGNOSIS — M79671 Pain in right foot: Secondary | ICD-10-CM

## 2023-11-11 DIAGNOSIS — J309 Allergic rhinitis, unspecified: Secondary | ICD-10-CM

## 2023-11-11 DIAGNOSIS — I739 Peripheral vascular disease, unspecified: Secondary | ICD-10-CM

## 2023-11-11 MED ORDER — MONTELUKAST SODIUM 10 MG PO TABS: ORAL_TABLET | ORAL | 1 refills | Status: DC

## 2023-11-11 MED ORDER — CLOPIDOGREL BISULFATE 75 MG PO TABS: 75.0000 mg | ORAL_TABLET | Freq: Every day | ORAL | 1 refills | Status: DC

## 2023-11-11 NOTE — Progress Notes (Signed)
 Office Note   History of Present Illness   [name] is a 88 y.o. (Aug 19, 1935) male who presents for follow up.  He has a history of PAD with a known occlusion of his right common and external iliac artery, which was seen on angiogram by Dr. Eldridge Dace in April 2021.  He has no prior history of vascular interventions.  He does have a known area of pain on the ball of his right foot where his bone is very prominent.  His podiatrist has explained that this pain is due to fat pad deterioration.  He has to wear inserts for his feet and well-fitted shoes with lots of cushion.  He also has a prior history of stroke and wears a brace on his right lower extremity.  He returns today for follow-up.  His son is present at today's visit and offers most of the history.  He says that the patient has had some increased pain/soreness to the bottom of his right foot since February.  He was noted to have a callus on the plantar aspect of his right 5th metatarsal.  Podiatry has performed small debridements to this area.  Overall this callused area has gotten smaller in size.  He denies any erythema to this area, drainage, fever, or chills.  He states that this callused area and the ball of his foot is still very sore whenever he puts pressure on it.  His foot does not hurt him at rest as long as he is not putting pressure on the ball of his foot.  If he floats his foot or props it up, it does not hurt.  Current Outpatient Medications  Medication Sig Dispense Refill   acetaminophen (TYLENOL) 325 MG tablet Take 2 tablets (650 mg total) by mouth every 4 (four) hours as needed for headache or mild pain.     amLODipine (NORVASC) 2.5 MG tablet TAKE 1 TABLET(2.5 MG) BY MOUTH DAILY 90 tablet 0   aspirin EC 81 MG tablet Take 81 mg by mouth daily.     cetirizine (ZYRTEC) 10 MG tablet Take 10 mg by mouth as needed for allergies.     Cholecalciferol (VITAMIN D) 50 MCG (2000 UT) tablet Take 2,000 Units by mouth daily.      clopidogrel (PLAVIX) 75 MG tablet Take 1 tablet (75 mg total) by mouth daily. 90 tablet 1   fluticasone (FLONASE) 50 MCG/ACT nasal spray Place 2 sprays into both nostrils daily. 16 g 2   Misc Natural Products (ELDERBERRY IMMUNE COMPLEX PO) Take by mouth daily.     montelukast (SINGULAIR) 10 MG tablet TAKE 1 TABLET BY MOUTH DAILY 90 tablet 1   polyethylene glycol (MIRALAX / GLYCOLAX) 17 g packet Take 17 g by mouth daily. 14 each 0   rosuvastatin (CRESTOR) 20 MG tablet TAKE 1 TABLET(20 MG) BY MOUTH DAILY 90 tablet 1   silver sulfADIAZINE (SILVADENE) 1 % cream Apply 1 Application topically daily. 50 g 0   tamsulosin (FLOMAX) 0.4 MG CAPS capsule Take 1 capsule (0.4 mg total) by mouth daily. 90 capsule 1   zinc gluconate 50 MG tablet Take 50 mg by mouth daily.     No current facility-administered medications for this visit.    REVIEW OF SYSTEMS (negative unless checked):   Cardiac:  []  Chest pain or chest pressure? []  Shortness of breath upon activity? []  Shortness of breath when lying flat? []  Irregular heart rhythm?  Vascular:  []  Pain in calf, thigh, or hip brought  on by walking? []  Pain in feet at night that wakes you up from your sleep? []  Blood clot in your veins? []  Leg swelling?  Pulmonary:  []  Oxygen at home? []  Productive cough? []  Wheezing?  Neurologic:  []  Sudden weakness in arms or legs? []  Sudden numbness in arms or legs? []  Sudden onset of difficult speaking or slurred speech? []  Temporary loss of vision in one eye? []  Problems with dizziness?  Gastrointestinal:  []  Blood in stool? []  Vomited blood?  Genitourinary:  []  Burning when urinating? []  Blood in urine?  Psychiatric:  []  Major depression  Hematologic:  []  Bleeding problems? []  Problems with blood clotting?  Dermatologic:  []  Rashes or ulcers?  Constitutional:  []  Fever or chills?  Ear/Nose/Throat:  []  Change in hearing? []  Nose bleeds? []  Sore throat?  Musculoskeletal:  []  Back  pain? []  Joint pain? []  Muscle pain?   Physical Examination   Vitals:   11/11/23 1039  BP: (!) 147/66  Pulse: (!) 58  Temp: 98.1 F (36.7 C)  TempSrc: Temporal  SpO2: 94%  Weight: 177 lb (80.3 kg)  Height: 5\' 9"  (1.753 m)   Body mass index is 26.14 kg/m.  General:  WDWN in NAD; vital signs documented above Gait: Not observed HENT: WNL, normocephalic Pulmonary: normal non-labored breathing , without rales, rhonchi,  wheezing Cardiac: regular Abdomen: soft, NT, no masses Skin: without rashes Vascular Exam/Pulses: Brisk biphasic right DP/PT Doppler signals.  Palpable left DP pulse Extremities: without ischemic changes, without gangrene. Small, dry callus to the right plantar 5th metatarsal without erythema or drainage Musculoskeletal: no muscle wasting or atrophy  Neurologic: A&O X 3;  No focal weakness or paresthesias are detected Psychiatric:  The pt has Normal affect.    Non-Invasive Vascular imaging   ABI (10/17/2023) +---------+------------------+-----+--------+  Right   Rt Pressure (mmHg)IndexWaveform  +---------+------------------+-----+--------+  Brachial 152                              +---------+------------------+-----+--------+  PTA     110               0.72           +---------+------------------+-----+--------+  PERO                           biphasic  +---------+------------------+-----+--------+  DP      91                0.60 biphasic  +---------+------------------+-----+--------+  Great Toe77                0.51 Abnormal  +---------+------------------+-----+--------+   +---------+------------------+-----+-----------+  Left    Lt Pressure (mmHg)IndexWaveform     +---------+------------------+-----+-----------+  Brachial 147                                 +---------+------------------+-----+-----------+  PTA     151               0.99 multiphasic  +---------+------------------+-----+-----------+   DP      140               0.92 multiphasic  +---------+------------------+-----+-----------+  Great Toe116               0.76 Normal       +---------+------------------+-----+-----------+   +-------+-----------+-----------+------------+------------+  ABI/TBIToday's ABIToday's  TBIPrevious ABIPrevious TBI  +-------+-----------+-----------+------------+------------+  Right 0.72       0.51       0.53        0.48          +-------+-----------+-----------+------------+------------+  Left  0.99       0.76       0.85        0.65          +-------+-----------+-----------+------------+------------+    Medical Decision Making   KELDRIC POYER is a 88 y.o. male who presents for surveillance of PAD  Based on the patient's vascular studies, his ABIs appear improved since his last visit.  His right ABI 0.72 and left ABI 0.99 He has a known history of right common and external iliac artery occlusion.  He has no prior history of vascular inventions.  He has no prior history of claudication, rest pain, or tissue loss In February the patient did develop a small ulceration overlying a callus on the plantar aspect of his right fifth metatarsal.  Podiatry has performed small debridements to this area.  His ulcer has now healed and he continues to have a callus to this area.  He has had a history of pain to this area when he applies pressure to the foot.  He continues to have pain to this area of his foot, however only with pressure. He denies any rest pain in the foot or worsening tissue loss.  He does have brisk biphasic DP/PT Doppler signals on the right. I have explained to the patient that he has no urgent need for revascularization at this time, given that his wound on his right foot healed and he has no rest pain.  His pain on the bottom of his right foot does appear to be chronic and only associated with pressure to the foot.  He has no signs of infection to callused area of his  foot.  Recommend continued care per podiatry I will refer the patient to a pain management clinic for further pain control of his right foot, as he says that Tylenol does not help.  He can follow-up with our office in 6 months with repeat ABIs   Loel Dubonnet PA-C [facility] Office: [phone]  Clinic MD: Myra Gianotti

## 2023-11-13 ENCOUNTER — Other Ambulatory Visit: Payer: Self-pay

## 2023-11-13 DIAGNOSIS — I739 Peripheral vascular disease, unspecified: Secondary | ICD-10-CM

## 2023-11-18 ENCOUNTER — Encounter: Payer: Self-pay | Admitting: Podiatry

## 2023-11-18 ENCOUNTER — Ambulatory Visit: Admitting: Podiatry

## 2023-11-18 DIAGNOSIS — I739 Peripheral vascular disease, unspecified: Secondary | ICD-10-CM | POA: Diagnosis not present

## 2023-11-18 DIAGNOSIS — L97512 Non-pressure chronic ulcer of other part of right foot with fat layer exposed: Secondary | ICD-10-CM

## 2023-11-18 MED ORDER — TRAMADOL HCL 50 MG PO TABS: 50.0000 mg | ORAL_TABLET | Freq: Three times a day (TID) | ORAL | 0 refills | Status: AC | PRN

## 2023-11-18 NOTE — Progress Notes (Signed)
  Subjective:  Patient ID: [name], male    DOB: [birth year],   MRN: [id number]  No chief complaint on file.   88 y.o. male presents for follow-up of right foot wound. Relates he has been doing ok but the foot is getting very painful again. Has seen vascular and since wound healed no concern for revascularization.  Waiting to see pain management as well.    Patient has history of PAD and is on anticoagulants.   PCP:  [name], FNP    . Denies any other pedal complaints. Denies n/v/f/c.   Past Medical History:  Diagnosis Date   Arthritis    Bundle branch block    Hypercholesteremia    Hyperlipidemia 2001   Hypertension 2001   Hypertension    Peripheral vascular disease (HCC)    Stroke (HCC)    Stroke (HCC)    2001 - R side deficits    Objective:  Physical Exam: Vascular: DP/PT pulses 1/4 on left and no palpable on right. CFT <3 seconds. Absent hair growth on digits.  Xerosis noted bilaterally.  Skin. No lacerations or abrasions bilateral feet. Nails 1-5 bilateral  are thickened discolored and elongated with subungual debris. Upon debridement of hyperkeratotic lesion note plantar right fifth metatarsal small ulceration noted limited to breakdown of skin. No erythema edema or purulence noted. Measures about 0.1 cm x 0.2 cm x superficial Musculoskeletal: MMT 5/5 bilateral lower extremities in DF, PF, Inversion and Eversion. Deceased ROM in DF of ankle joint.  Neurological: Sensation intact to light touch. Protective sensation diminished bilateral.   Summary:  Right: Resting right ankle-brachial index indicates moderate right lower  extremity arterial disease. The right toe-brachial index is abnormal.   Left: Resting left ankle-brachial index is within normal range. The left  toe-brachial index is normal.    Bilateral ABIs and TBIs appear increased compared to prior study on  11/05/2022.    Assessment:   1. Skin ulcer of plantar aspect of right foot with fat layer  exposed (HCC)   2. PAD (peripheral artery disease) (HCC)         Plan:  Patient was evaluated and treated and all questions answered. Ulcer right plantar fifth metatarsal ulceration reopened  -Debridement over overlying callus underlying wound reopened.  -No revascularization at this time with vascular  -Referral to pain management suggested and placed today.   -Off-loading with regular shoe concern for safety with surgical shoe so padding added to insert to offload  -No abx indicated.  -Referral to wound care given reulceration and potential need for HBO therapy.  -Discussed glucose control and proper protein-rich diet.  -Tramadol sent for pain.  -Discussed if any worsening redness, pain, fever or chills to call or may need to report to the emergency room. Patient expressed understanding.   Procedure: Excisional Debridement of Wound Rationale: Removal of non-viable soft tissue from the wound to promote healing.  Anesthesia: none Pre-Debridement Wound Measurements: Ovelrying callus  Post-Debridement Wound Measurements: 0.5 cm x 0.4 cm x 0.3 cm  Type of Debridement: Sharp Excisional Tissue Removed: Non-viable soft tissue Depth of Debridement: subcutaneous tissue. Technique: Sharp excisional debridement to bleeding, viable wound base.  Dressing: Dry, sterile, compression dressing. Disposition: Patient tolerated procedure well. Patient to return in 2 week for follow-up.  Return in about 2 weeks (around 12/02/2023) for wound check.   Return in about 2 weeks (around 12/02/2023) for wound check.   Louann Sjogren, DPM

## 2023-11-27 ENCOUNTER — Ambulatory Visit: Admitting: Podiatry

## 2023-11-29 ENCOUNTER — Encounter (HOSPITAL_BASED_OUTPATIENT_CLINIC_OR_DEPARTMENT_OTHER): Attending: Internal Medicine | Admitting: Internal Medicine

## 2023-11-29 ENCOUNTER — Ambulatory Visit
Admission: RE | Admit: 2023-11-29 | Discharge: 2023-11-29 | Disposition: A | Discharge status: Home / Self Care | Source: Ambulatory Visit | Attending: Internal Medicine | Admitting: Internal Medicine

## 2023-11-29 ENCOUNTER — Other Ambulatory Visit: Payer: Self-pay | Admitting: Internal Medicine

## 2023-11-29 DIAGNOSIS — I70235 Atherosclerosis of native arteries of right leg with ulceration of other part of foot: Secondary | ICD-10-CM | POA: Diagnosis not present

## 2023-11-29 DIAGNOSIS — E785 Hyperlipidemia, unspecified: Secondary | ICD-10-CM | POA: Insufficient documentation

## 2023-11-29 DIAGNOSIS — L97518 Non-pressure chronic ulcer of other part of right foot with other specified severity: Secondary | ICD-10-CM | POA: Diagnosis not present

## 2023-11-29 DIAGNOSIS — I63319 Cerebral infarction due to thrombosis of unspecified middle cerebral artery: Secondary | ICD-10-CM | POA: Insufficient documentation

## 2023-11-29 DIAGNOSIS — I10 Essential (primary) hypertension: Secondary | ICD-10-CM | POA: Diagnosis not present

## 2023-11-29 DIAGNOSIS — N400 Enlarged prostate without lower urinary tract symptoms: Secondary | ICD-10-CM | POA: Diagnosis not present

## 2023-11-29 DIAGNOSIS — S91301A Unspecified open wound, right foot, initial encounter: Secondary | ICD-10-CM

## 2023-11-29 DIAGNOSIS — H905 Unspecified sensorineural hearing loss: Secondary | ICD-10-CM | POA: Diagnosis not present

## 2023-11-29 DIAGNOSIS — M79671 Pain in right foot: Secondary | ICD-10-CM | POA: Diagnosis not present

## 2023-11-29 DIAGNOSIS — L97512 Non-pressure chronic ulcer of other part of right foot with fat layer exposed: Secondary | ICD-10-CM | POA: Diagnosis not present

## 2023-12-02 ENCOUNTER — Ambulatory Visit: Admitting: Podiatry

## 2023-12-02 DIAGNOSIS — Z8673 Personal history of transient ischemic attack (TIA), and cerebral infarction without residual deficits: Secondary | ICD-10-CM | POA: Diagnosis not present

## 2023-12-02 DIAGNOSIS — I70235 Atherosclerosis of native arteries of right leg with ulceration of other part of foot: Secondary | ICD-10-CM | POA: Diagnosis not present

## 2023-12-02 DIAGNOSIS — N400 Enlarged prostate without lower urinary tract symptoms: Secondary | ICD-10-CM | POA: Diagnosis not present

## 2023-12-02 DIAGNOSIS — L97512 Non-pressure chronic ulcer of other part of right foot with fat layer exposed: Secondary | ICD-10-CM | POA: Diagnosis not present

## 2023-12-02 DIAGNOSIS — E785 Hyperlipidemia, unspecified: Secondary | ICD-10-CM | POA: Diagnosis not present

## 2023-12-02 DIAGNOSIS — Z79899 Other long term (current) drug therapy: Secondary | ICD-10-CM | POA: Diagnosis not present

## 2023-12-02 DIAGNOSIS — I10 Essential (primary) hypertension: Secondary | ICD-10-CM | POA: Diagnosis not present

## 2023-12-02 DIAGNOSIS — L97518 Non-pressure chronic ulcer of other part of right foot with other specified severity: Secondary | ICD-10-CM | POA: Diagnosis not present

## 2023-12-02 DIAGNOSIS — H903 Sensorineural hearing loss, bilateral: Secondary | ICD-10-CM | POA: Diagnosis not present

## 2023-12-02 DIAGNOSIS — I63319 Cerebral infarction due to thrombosis of unspecified middle cerebral artery: Secondary | ICD-10-CM | POA: Diagnosis not present

## 2023-12-02 DIAGNOSIS — Z7982 Long term (current) use of aspirin: Secondary | ICD-10-CM | POA: Diagnosis not present

## 2023-12-06 ENCOUNTER — Ambulatory Visit
Admission: RE | Admit: 2023-12-06 | Discharge: 2023-12-06 | Disposition: A | Discharge status: Home / Self Care | Source: Ambulatory Visit | Attending: Internal Medicine

## 2023-12-06 ENCOUNTER — Encounter (HOSPITAL_BASED_OUTPATIENT_CLINIC_OR_DEPARTMENT_OTHER): Attending: Internal Medicine | Admitting: Internal Medicine

## 2023-12-06 ENCOUNTER — Other Ambulatory Visit: Payer: Self-pay | Admitting: Internal Medicine

## 2023-12-06 DIAGNOSIS — I63319 Cerebral infarction due to thrombosis of unspecified middle cerebral artery: Secondary | ICD-10-CM | POA: Diagnosis not present

## 2023-12-06 DIAGNOSIS — M79671 Pain in right foot: Secondary | ICD-10-CM

## 2023-12-06 DIAGNOSIS — M869 Osteomyelitis, unspecified: Secondary | ICD-10-CM

## 2023-12-06 DIAGNOSIS — S91301A Unspecified open wound, right foot, initial encounter: Secondary | ICD-10-CM | POA: Diagnosis not present

## 2023-12-06 DIAGNOSIS — I70235 Atherosclerosis of native arteries of right leg with ulceration of other part of foot: Secondary | ICD-10-CM | POA: Diagnosis not present

## 2023-12-06 DIAGNOSIS — L97518 Non-pressure chronic ulcer of other part of right foot with other specified severity: Secondary | ICD-10-CM

## 2023-12-10 ENCOUNTER — Inpatient Hospital Stay (HOSPITAL_COMMUNITY)
Admission: EM | Admit: 2023-12-10 | Discharge: 2023-12-17 | DRG: 854 | Disposition: A | Discharge status: Skilled Nursing Facility | Attending: Family Medicine | Admitting: Family Medicine

## 2023-12-10 ENCOUNTER — Encounter (HOSPITAL_COMMUNITY): Payer: Self-pay

## 2023-12-10 ENCOUNTER — Emergency Department (HOSPITAL_COMMUNITY)

## 2023-12-10 ENCOUNTER — Other Ambulatory Visit: Payer: Self-pay

## 2023-12-10 ENCOUNTER — Inpatient Hospital Stay (HOSPITAL_COMMUNITY)

## 2023-12-10 DIAGNOSIS — I69351 Hemiplegia and hemiparesis following cerebral infarction affecting right dominant side: Secondary | ICD-10-CM | POA: Diagnosis not present

## 2023-12-10 DIAGNOSIS — S72001A Fracture of unspecified part of neck of right femur, initial encounter for closed fracture: Secondary | ICD-10-CM | POA: Diagnosis not present

## 2023-12-10 DIAGNOSIS — L089 Local infection of the skin and subcutaneous tissue, unspecified: Secondary | ICD-10-CM | POA: Diagnosis not present

## 2023-12-10 DIAGNOSIS — R7401 Elevation of levels of liver transaminase levels: Secondary | ICD-10-CM | POA: Diagnosis present

## 2023-12-10 DIAGNOSIS — A419 Sepsis, unspecified organism: Secondary | ICD-10-CM | POA: Diagnosis not present

## 2023-12-10 DIAGNOSIS — M86171 Other acute osteomyelitis, right ankle and foot: Secondary | ICD-10-CM | POA: Diagnosis present

## 2023-12-10 DIAGNOSIS — Z741 Need for assistance with personal care: Secondary | ICD-10-CM | POA: Diagnosis not present

## 2023-12-10 DIAGNOSIS — M65971 Unspecified synovitis and tenosynovitis, right ankle and foot: Secondary | ICD-10-CM | POA: Diagnosis present

## 2023-12-10 DIAGNOSIS — I4891 Unspecified atrial fibrillation: Secondary | ICD-10-CM | POA: Diagnosis present

## 2023-12-10 DIAGNOSIS — I2510 Atherosclerotic heart disease of native coronary artery without angina pectoris: Secondary | ICD-10-CM | POA: Diagnosis not present

## 2023-12-10 DIAGNOSIS — D649 Anemia, unspecified: Secondary | ICD-10-CM | POA: Diagnosis not present

## 2023-12-10 DIAGNOSIS — D75838 Other thrombocytosis: Secondary | ICD-10-CM | POA: Diagnosis present

## 2023-12-10 DIAGNOSIS — M6259 Muscle wasting and atrophy, not elsewhere classified, multiple sites: Secondary | ICD-10-CM | POA: Diagnosis not present

## 2023-12-10 DIAGNOSIS — E441 Mild protein-calorie malnutrition: Secondary | ICD-10-CM | POA: Diagnosis not present

## 2023-12-10 DIAGNOSIS — Z7401 Bed confinement status: Secondary | ICD-10-CM | POA: Diagnosis not present

## 2023-12-10 DIAGNOSIS — Z823 Family history of stroke: Secondary | ICD-10-CM

## 2023-12-10 DIAGNOSIS — Z4789 Encounter for other orthopedic aftercare: Secondary | ICD-10-CM | POA: Diagnosis not present

## 2023-12-10 DIAGNOSIS — G8111 Spastic hemiplegia affecting right dominant side: Secondary | ICD-10-CM | POA: Diagnosis present

## 2023-12-10 DIAGNOSIS — E782 Mixed hyperlipidemia: Secondary | ICD-10-CM | POA: Diagnosis present

## 2023-12-10 DIAGNOSIS — I739 Peripheral vascular disease, unspecified: Secondary | ICD-10-CM | POA: Diagnosis present

## 2023-12-10 DIAGNOSIS — I745 Embolism and thrombosis of iliac artery: Secondary | ICD-10-CM | POA: Diagnosis present

## 2023-12-10 DIAGNOSIS — E11628 Type 2 diabetes mellitus with other skin complications: Secondary | ICD-10-CM | POA: Diagnosis not present

## 2023-12-10 DIAGNOSIS — I10 Essential (primary) hypertension: Secondary | ICD-10-CM | POA: Diagnosis not present

## 2023-12-10 DIAGNOSIS — M869 Osteomyelitis, unspecified: Secondary | ICD-10-CM | POA: Diagnosis present

## 2023-12-10 DIAGNOSIS — R5381 Other malaise: Secondary | ICD-10-CM | POA: Diagnosis present

## 2023-12-10 DIAGNOSIS — R7303 Prediabetes: Secondary | ICD-10-CM | POA: Diagnosis present

## 2023-12-10 DIAGNOSIS — Z79899 Other long term (current) drug therapy: Secondary | ICD-10-CM | POA: Diagnosis not present

## 2023-12-10 DIAGNOSIS — Z8249 Family history of ischemic heart disease and other diseases of the circulatory system: Secondary | ICD-10-CM | POA: Diagnosis not present

## 2023-12-10 DIAGNOSIS — M1611 Unilateral primary osteoarthritis, right hip: Secondary | ICD-10-CM | POA: Diagnosis not present

## 2023-12-10 DIAGNOSIS — M6281 Muscle weakness (generalized): Secondary | ICD-10-CM | POA: Diagnosis not present

## 2023-12-10 DIAGNOSIS — Z951 Presence of aortocoronary bypass graft: Secondary | ICD-10-CM

## 2023-12-10 DIAGNOSIS — Z7902 Long term (current) use of antithrombotics/antiplatelets: Secondary | ICD-10-CM

## 2023-12-10 DIAGNOSIS — B9561 Methicillin susceptible Staphylococcus aureus infection as the cause of diseases classified elsewhere: Secondary | ICD-10-CM | POA: Diagnosis present

## 2023-12-10 DIAGNOSIS — D75839 Thrombocytosis, unspecified: Secondary | ICD-10-CM | POA: Diagnosis present

## 2023-12-10 DIAGNOSIS — R1312 Dysphagia, oropharyngeal phase: Secondary | ICD-10-CM | POA: Diagnosis not present

## 2023-12-10 DIAGNOSIS — I480 Paroxysmal atrial fibrillation: Secondary | ICD-10-CM | POA: Diagnosis present

## 2023-12-10 DIAGNOSIS — R1030 Lower abdominal pain, unspecified: Secondary | ICD-10-CM | POA: Diagnosis not present

## 2023-12-10 DIAGNOSIS — I272 Pulmonary hypertension, unspecified: Secondary | ICD-10-CM | POA: Diagnosis present

## 2023-12-10 DIAGNOSIS — M25474 Effusion, right foot: Secondary | ICD-10-CM | POA: Diagnosis not present

## 2023-12-10 DIAGNOSIS — E1169 Type 2 diabetes mellitus with other specified complication: Secondary | ICD-10-CM | POA: Diagnosis not present

## 2023-12-10 DIAGNOSIS — Z6826 Body mass index (BMI) 26.0-26.9, adult: Secondary | ICD-10-CM

## 2023-12-10 DIAGNOSIS — Z751 Person awaiting admission to adequate facility elsewhere: Secondary | ICD-10-CM | POA: Diagnosis not present

## 2023-12-10 DIAGNOSIS — L97519 Non-pressure chronic ulcer of other part of right foot with unspecified severity: Secondary | ICD-10-CM | POA: Diagnosis present

## 2023-12-10 DIAGNOSIS — R2689 Other abnormalities of gait and mobility: Secondary | ICD-10-CM | POA: Diagnosis not present

## 2023-12-10 DIAGNOSIS — R609 Edema, unspecified: Secondary | ICD-10-CM | POA: Diagnosis not present

## 2023-12-10 DIAGNOSIS — Z7982 Long term (current) use of aspirin: Secondary | ICD-10-CM | POA: Diagnosis not present

## 2023-12-10 DIAGNOSIS — H9190 Unspecified hearing loss, unspecified ear: Secondary | ICD-10-CM | POA: Diagnosis present

## 2023-12-10 DIAGNOSIS — R41841 Cognitive communication deficit: Secondary | ICD-10-CM | POA: Diagnosis not present

## 2023-12-10 DIAGNOSIS — S93124A Dislocation of metatarsophalangeal joint of right lesser toe(s), initial encounter: Secondary | ICD-10-CM | POA: Diagnosis not present

## 2023-12-10 DIAGNOSIS — I493 Ventricular premature depolarization: Secondary | ICD-10-CM | POA: Diagnosis present

## 2023-12-10 DIAGNOSIS — I252 Old myocardial infarction: Secondary | ICD-10-CM | POA: Diagnosis not present

## 2023-12-10 DIAGNOSIS — M86179 Other acute osteomyelitis, unspecified ankle and foot: Secondary | ICD-10-CM | POA: Diagnosis not present

## 2023-12-10 DIAGNOSIS — S99929A Unspecified injury of unspecified foot, initial encounter: Secondary | ICD-10-CM | POA: Diagnosis not present

## 2023-12-10 DIAGNOSIS — M2011 Hallux valgus (acquired), right foot: Secondary | ICD-10-CM | POA: Diagnosis not present

## 2023-12-10 DIAGNOSIS — I6930 Unspecified sequelae of cerebral infarction: Secondary | ICD-10-CM

## 2023-12-10 LAB — COMPREHENSIVE METABOLIC PANEL WITH GFR
ALT: 95 U/L — ABNORMAL HIGH (ref 0–44)
AST: 79 U/L — ABNORMAL HIGH (ref 15–41)
Albumin: 3.2 g/dL — ABNORMAL LOW (ref 3.5–5.0)
Alkaline Phosphatase: 80 U/L (ref 38–126)
Anion gap: 9 (ref 5–15)
BUN: 13 mg/dL (ref 8–23)
CO2: 25 mmol/L (ref 22–32)
Calcium: 9 mg/dL (ref 8.9–10.3)
Chloride: 105 mmol/L (ref 98–111)
Creatinine, Ser: 0.77 mg/dL (ref 0.61–1.24)
GFR, Estimated: >60 mL/min (ref >60)
Glucose, Bld: 105 mg/dL — ABNORMAL HIGH (ref 70–99)
Potassium: 3.6 mmol/L (ref 3.5–5.1)
Sodium: 139 mmol/L (ref 135–145)
Total Bilirubin: 1.2 mg/dL (ref 0.0–1.2)
Total Protein: 7.2 g/dL (ref 6.5–8.1)

## 2023-12-10 LAB — CBC WITH DIFFERENTIAL/PLATELET
Abs Immature Granulocytes: 0.5 10*3/uL — ABNORMAL HIGH (ref 0.00–0.07)
Basophils Absolute: 0.1 10*3/uL (ref 0.0–0.1)
Basophils Relative: 1 %
Eosinophils Absolute: 1.1 10*3/uL — ABNORMAL HIGH (ref 0.0–0.5)
Eosinophils Relative: 9 %
HCT: 37.9 % — ABNORMAL LOW (ref 39.0–52.0)
Hemoglobin: 12.8 g/dL — ABNORMAL LOW (ref 13.0–17.0)
Immature Granulocytes: 4 %
Lymphocytes Relative: 12 %
Lymphs Abs: 1.4 10*3/uL (ref 0.7–4.0)
MCH: 32.2 pg (ref 26.0–34.0)
MCHC: 33.8 g/dL (ref 30.0–36.0)
MCV: 95.5 fL (ref 80.0–100.0)
Monocytes Absolute: 1.5 10*3/uL — ABNORMAL HIGH (ref 0.1–1.0)
Monocytes Relative: 13 %
Neutro Abs: 7.3 10*3/uL (ref 1.7–7.7)
Neutrophils Relative %: 61 %
Platelets: 448 10*3/uL — ABNORMAL HIGH (ref 150–400)
RBC: 3.97 MIL/uL — ABNORMAL LOW (ref 4.22–5.81)
RDW: 15.5 % (ref 11.5–15.5)
WBC: 11.9 10*3/uL — ABNORMAL HIGH (ref 4.0–10.5)
nRBC: 0 % (ref 0.0–0.2)

## 2023-12-10 LAB — GLUCOSE, CAPILLARY: Glucose-Capillary: 106 mg/dL — ABNORMAL HIGH (ref 70–99)

## 2023-12-10 LAB — PROTIME-INR
INR: 1.2 (ref 0.8–1.2)
Prothrombin Time: 15.4 s — ABNORMAL HIGH (ref 11.4–15.2)

## 2023-12-10 LAB — URINALYSIS, W/ REFLEX TO CULTURE (INFECTION SUSPECTED)
Bacteria, UA: NONE SEEN
Bilirubin Urine: NEGATIVE
Glucose, UA: NEGATIVE mg/dL
Ketones, ur: NEGATIVE mg/dL
Leukocytes,Ua: NEGATIVE
Nitrite: NEGATIVE
Protein, ur: 30 mg/dL — AB
Specific Gravity, Urine: 1.008 (ref 1.005–1.030)
pH: 7 (ref 5.0–8.0)

## 2023-12-10 LAB — PREALBUMIN: Prealbumin: 10 mg/dL — ABNORMAL LOW (ref 18–38)

## 2023-12-10 LAB — SEDIMENTATION RATE: Sed Rate: 72 mm/h — ABNORMAL HIGH (ref 0–16)

## 2023-12-10 LAB — I-STAT CG4 LACTIC ACID, ED: Lactic Acid, Venous: 1.2 mmol/L (ref 0.5–1.9)

## 2023-12-10 LAB — C-REACTIVE PROTEIN: CRP: 10.8 mg/dL — ABNORMAL HIGH (ref <1.0)

## 2023-12-10 LAB — CBG MONITORING, ED: Glucose-Capillary: 99 mg/dL (ref 70–99)

## 2023-12-10 MED ORDER — INSULIN ASPART 100 UNIT/ML IJ SOLN
0.0000 [IU] | Freq: Three times a day (TID) | INTRAMUSCULAR | Status: DC
  Administered 2023-12-11: 1 [IU] via SUBCUTANEOUS
  Administered 2023-12-12: 2 [IU] via SUBCUTANEOUS
  Filled 2023-12-10: qty 0.09

## 2023-12-10 MED ORDER — SODIUM CHLORIDE 0.9 % IV SOLN
1.0000 g | INTRAVENOUS | Status: DC
  Administered 2023-12-11 – 2023-12-13 (×3): 1 g via INTRAVENOUS
  Filled 2023-12-10 (×3): qty 10

## 2023-12-10 MED ORDER — POLYETHYLENE GLYCOL 3350 17 G PO PACK
17.0000 g | PACK | Freq: Every day | ORAL | Status: DC
  Administered 2023-12-12 – 2023-12-17 (×6): 17 g via ORAL
  Filled 2023-12-10 (×6): qty 1

## 2023-12-10 MED ORDER — ACETAMINOPHEN 325 MG PO TABS
650.0000 mg | ORAL_TABLET | Freq: Four times a day (QID) | ORAL | Status: DC | PRN
  Administered 2023-12-11 – 2023-12-17 (×5): 650 mg via ORAL
  Filled 2023-12-10 (×5): qty 2

## 2023-12-10 MED ORDER — ONDANSETRON HCL 4 MG/2ML IJ SOLN
4.0000 mg | Freq: Four times a day (QID) | INTRAMUSCULAR | Status: DC | PRN
Start: 2023-12-10 — End: 2023-12-17

## 2023-12-10 MED ORDER — ONDANSETRON HCL 4 MG PO TABS: 4.0000 mg | ORAL_TABLET | Freq: Four times a day (QID) | ORAL | Status: DC | PRN

## 2023-12-10 MED ORDER — ACETAMINOPHEN 650 MG RE SUPP: 650.0000 mg | Freq: Four times a day (QID) | RECTAL | Status: DC | PRN

## 2023-12-10 MED ORDER — ENOXAPARIN SODIUM 40 MG/0.4ML IJ SOSY: 40.0000 mg | PREFILLED_SYRINGE | INTRAMUSCULAR | Status: DC

## 2023-12-10 MED ORDER — HYDROMORPHONE HCL 1 MG/ML IJ SOLN
0.5000 mg | Freq: Four times a day (QID) | INTRAMUSCULAR | Status: DC | PRN
  Administered 2023-12-10 – 2023-12-15 (×2): 0.5 mg via INTRAVENOUS
  Filled 2023-12-10: qty 1
  Filled 2023-12-10: qty 0.5

## 2023-12-10 MED ORDER — TAMSULOSIN HCL 0.4 MG PO CAPS
0.4000 mg | ORAL_CAPSULE | Freq: Every day | ORAL | Status: DC
  Administered 2023-12-10 – 2023-12-17 (×8): 0.4 mg via ORAL
  Filled 2023-12-10 (×8): qty 1

## 2023-12-10 MED ORDER — SODIUM CHLORIDE 0.9 % IV SOLN
2.0000 g | Freq: Once | INTRAVENOUS | Status: AC
  Administered 2023-12-10: 2 g via INTRAVENOUS
  Filled 2023-12-10: qty 20

## 2023-12-10 MED ORDER — ROSUVASTATIN CALCIUM 20 MG PO TABS: 20.0000 mg | ORAL_TABLET | Freq: Every day | ORAL | Status: DC

## 2023-12-10 MED ORDER — AMLODIPINE BESYLATE 2.5 MG PO TABS
2.5000 mg | ORAL_TABLET | Freq: Every day | ORAL | Status: DC
  Administered 2023-12-10 – 2023-12-17 (×8): 2.5 mg via ORAL
  Filled 2023-12-10 (×8): qty 1

## 2023-12-10 MED ORDER — MONTELUKAST SODIUM 10 MG PO TABS
10.0000 mg | ORAL_TABLET | Freq: Every day | ORAL | Status: DC
  Administered 2023-12-10 – 2023-12-16 (×7): 10 mg via ORAL
  Filled 2023-12-10 (×8): qty 1

## 2023-12-10 NOTE — ED Notes (Signed)
 ED TO INPATIENT HANDOFF REPORT  Name/Age/Gender [name] 88 y.o. male  Code Status Code Status History     Date Active Date Inactive Code Status Order ID Comments User Context   11/17/2019 1718 12/06/2019 1933 Full Code [id number]  Renold Cashing ED   11/03/2019 1025 11/05/2019 2325 Full Code [id number]  Lena Qualia, MD ED       Home/SNF/Other Home  Chief Complaint Type 2 diabetes mellitus with right diabetic foot infection (HCC) [H08.657, L08.9]  Level of Care/Admitting Diagnosis ED Disposition     ED Disposition  Admit   Condition  --   Comment  Hospital Area: [facility] [[id number]]  Level of Care: Telemetry [5]  Admit to tele based on following criteria: Complex arrhythmia (Bradycardia/Tachycardia)  May admit patient to [facility] or [facility] if equivalent level of care is available:: No  Covid Evaluation: Asymptomatic - no recent exposure (last 10 days) testing not required  Diagnosis: Type 2 diabetes mellitus with right diabetic foot infection [facility]) [[id number]]  Admitting Physician: Danice Dural [[id number]]  Attending Physician: Danice Dural [[id number]]  Certification:: I certify this patient will need inpatient services for at least 2 midnights  Expected Medical Readiness: 12/12/2023          Medical History Past Medical History:  Diagnosis Date   Arthritis    Bundle branch block    Hypercholesteremia    Hyperlipidemia 2001   Hypertension 2001   Hypertension    Peripheral vascular disease (HCC)    Stroke (HCC)    Stroke (HCC)    2001 - R side deficits    Allergies No Known Allergies  IV Location/Drains/Wounds Patient Lines/Drains/Airways Status     Active Line/Drains/Airways     Name Placement date Placement time Site Days   Peripheral IV 12/10/23 18 G Left Antecubital 12/10/23  0804  Antecubital  less than 1   Peripheral IV 12/10/23 20 G Left;Posterior Wrist 12/10/23  0804  Wrist  less than 1             Labs/Imaging Results for orders placed or performed during the hospital encounter of 12/10/23 (from the past 48 hours)  Comprehensive metabolic panel     Status: Abnormal   Collection Time: 12/10/23  7:54 AM  Result Value Ref Range   Sodium 139 135 - 145 mmol/L   Potassium 3.6 3.5 - 5.1 mmol/L   Chloride 105 98 - 111 mmol/L   CO2 25 22 - 32 mmol/L   Glucose, Bld 105 (H) 70 - 99 mg/dL    Comment: Glucose reference range applies only to samples taken after fasting for at least 8 hours.   BUN 13 8 - 23 mg/dL   Creatinine, Ser 7.25 0.61 - 1.24 mg/dL   Calcium  9.0 8.9 - 10.3 mg/dL   Total Protein 7.2 6.5 - 8.1 g/dL   Albumin  3.2 (L) 3.5 - 5.0 g/dL   AST 79 (H) 15 - 41 U/L   ALT 95 (H) 0 - 44 U/L   Alkaline Phosphatase 80 38 - 126 U/L   Total Bilirubin 1.2 0.0 - 1.2 mg/dL   GFR, Estimated >36 >64 mL/min    Comment: (NOTE) Calculated using the CKD-EPI Creatinine Equation (2021)    Anion gap 9 5 - 15    Comment: Performed at [facility], [address], [place], [place] [address]  CBC with Differential     Status: Abnormal   Collection Time: 12/10/23  7:54 AM  Result Value Ref Range   WBC 11.9 (H) 4.0 - 10.5 K/uL   RBC 3.97 (L) 4.22 - 5.81 MIL/uL   Hemoglobin 12.8 (L) 13.0 - 17.0 g/dL   HCT 56.2 (L) 13.0 - 86.5 %   MCV 95.5 80.0 - 100.0 fL   MCH 32.2 26.0 - 34.0 pg   MCHC 33.8 30.0 - 36.0 g/dL   RDW 78.4 69.6 - 29.5 %   Platelets 448 (H) 150 - 400 K/uL   nRBC 0.0 0.0 - 0.2 %   Neutrophils Relative % 61 %   Neutro Abs 7.3 1.7 - 7.7 K/uL   Lymphocytes Relative 12 %   Lymphs Abs 1.4 0.7 - 4.0 K/uL   Monocytes Relative 13 %   Monocytes Absolute 1.5 (H) 0.1 - 1.0 K/uL   Eosinophils Relative 9 %   Eosinophils Absolute 1.1 (H) 0.0 - 0.5 K/uL   Basophils Relative 1 %   Basophils Absolute 0.1 0.0 - 0.1 K/uL   Immature Granulocytes 4 %   Abs Immature Granulocytes 0.50 (H) 0.00 - 0.07 K/uL    Comment: Performed at [facility], [address], [place], [place] [address]  Protime-INR     Status: Abnormal   Collection Time: 12/10/23  7:54 AM  Result Value Ref Range   Prothrombin Time 15.4 (H) 11.4 - 15.2 seconds   INR 1.2 0.8 - 1.2    Comment: (NOTE) INR goal varies based on device and disease states. Performed at [facility], [address], [place], [place] [address]   Urinalysis, w/ Reflex to Culture (Infection Suspected) -Urine, Clean Catch     Status: Abnormal   Collection Time: 12/10/23  7:54 AM  Result Value Ref Range   Specimen Source URINE, CATHETERIZED    Color, Urine STRAW (A) YELLOW   APPearance CLEAR CLEAR   Specific Gravity, Urine 1.008 1.005 - 1.030   pH 7.0 5.0 - 8.0   Glucose, UA NEGATIVE NEGATIVE mg/dL   Hgb urine dipstick SMALL (A) NEGATIVE   Bilirubin Urine NEGATIVE NEGATIVE   Ketones, ur NEGATIVE NEGATIVE mg/dL   Protein, ur 30 (A) NEGATIVE mg/dL   Nitrite NEGATIVE NEGATIVE   Leukocytes,Ua NEGATIVE NEGATIVE   RBC / HPF 0-5 0 - 5 RBC/hpf   WBC, UA 0-5 0 - 5 WBC/hpf    Comment:        Reflex urine culture not performed if WBC <=10, OR if Squamous epithelial cells >5. If Squamous epithelial cells >5 suggest recollection.    Bacteria, UA NONE SEEN NONE SEEN   Squamous Epithelial / HPF 0-5 0 - 5 /HPF    Comment: Performed at [facility], [address] [place], [place] [address]  Sedimentation rate     Status: Abnormal   Collection Time: 12/10/23  7:54 AM  Result Value Ref Range   Sed Rate 72 (H) 0 - 16 mm/hr    Comment: Performed at [facility], [address], [place], [place] [address]  I-Stat Lactic Acid, ED     Status: None   Collection Time: 12/10/23  8:03 AM  Result Value Ref Range   Lactic Acid, Venous 1.2 0.5 - 1.9 mmol/L   DG Foot Complete Right Result Date: 12/10/2023 CLINICAL DATA:  Foot ulcer. EXAM: RIGHT FOOT COMPLETE - 3+ VIEW COMPARISON:  Dec 06, 2023. FINDINGS: There is no evidence of fracture or dislocation.  There is no evidence of arthropathy. Lucency is seen involving the  distal fifth metatarsal and possible osteomyelitis cannot be excluded. Overlying bandage and soft tissue wound is noted. Vascular calcifications are noted. IMPRESSION: Possible lucency involving distal fifth metatarsal suggesting possible osteomyelitis. MRI may be performed for further evaluation. Electronically Signed   By: Rosalene Colon M.D.   On: 12/10/2023 08:53    Pending Labs Unresulted Labs (From admission, onward)     Start     Ordered   12/10/23 0911  C-reactive protein  Once,   URGENT        12/10/23 0910   12/10/23 0754  Culture, blood (Routine x 2)  BLOOD CULTURE X 2,   R (with STAT occurrences)      12/10/23 0753            Vitals/Pain Today's Vitals   12/10/23 0741 12/10/23 0745 12/10/23 0746  BP:  (!) 151/101   Pulse:   71  Resp:  19   Temp:  98.3 F (36.8 C)   TempSrc:  Oral   SpO2:   99%  Weight: 80 kg    Height: 5\' 9"  (1.753 m)    PainSc: 10-Worst pain ever      Isolation Precautions No active isolations  Medications Medications  cefTRIAXone  (ROCEPHIN ) 2 g in sodium chloride  0.9 % 100 mL IVPB (0 g Intravenous Stopped 12/10/23 1039)    Mobility non-ambulatory

## 2023-12-10 NOTE — Consult Note (Signed)
 PODIATRY CONSULTATION  NAME [name] MRN [id number] DOB April 24, 1936 DOA 12/10/2023   Reason for consult:  Chief Complaint  Patient presents with   Wound Infection    Attending/Consulting physician: D. Bonita Bussing MD  History of present illness: :"88 y.o. male with medical history significant of distress bundle branch block hyperlipidemia hypertension peripheral vascular disease, history of CVA with right-sided hemiparesis who presented to the emergency department with right foot wound infection."  Patient states he has had the wound on his right foot for some time.  He is unable to give me a great history what is going on with the right foot.  Does appear to be slightly altered mental status at this time/possible dementia related.  He was seeing wound care prior to admission.  Looks like they have been packing the wound.  He reports he is living alone at home at this time.  I spoke with his cousin Lorrene Rosser who is aware of what is going on, did not know of any dementia diagnosis for the patient.  Per note he has been on antibiotics since Friday amoxicillin doxycycline has prior history of stroke and motor deficits on the right lower extremity.  He is previously seen Dr. Alvah Auerbach on November 18, 2023.  She sent him for evaluation by vascular surgery.  They determined he did not require any revascularization on the right lower extremity.  Past Medical History:  Diagnosis Date   Arthritis    Bundle branch block    Hypercholesteremia    Hyperlipidemia 2001   Hypertension 2001   Hypertension    Peripheral vascular disease (HCC)    Stroke (HCC)    Stroke (HCC)    2001 - R side deficits       Latest Ref Rng & Units 12/10/2023    7:54 AM 08/28/2023    3:24 PM 04/26/2023    5:25 PM  CBC  WBC 4.0 - 10.5 K/uL 11.9  9.6  6.8   Hemoglobin 13.0 - 17.0 g/dL 40.9  81.1  91.4   Hematocrit 39.0 - 52.0 % 37.9  40.7  37.9   Platelets 150 - 400 K/uL 448  278  231        Latest Ref Rng & Units  12/10/2023    7:54 AM 08/28/2023    3:24 PM 05/06/2023   10:32 AM  BMP  Glucose 70 - 99 mg/dL 782  96  91   BUN 8 - 23 mg/dL 13  11  11    Creatinine 0.61 - 1.24 mg/dL 9.56  2.13  0.86   BUN/Creat Ratio 10 - 24  12  11    Sodium 135 - 145 mmol/L 139  141  143   Potassium 3.5 - 5.1 mmol/L 3.6  4.2  4.7   Chloride 98 - 111 mmol/L 105  105  105   CO2 22 - 32 mmol/L 25  19  21    Calcium  8.9 - 10.3 mg/dL 9.0  9.9  9.5       Physical Exam: Lower Extremity Exam Deep ulceration probing down to fifth metatarsal head bone on the right lateral forefoot. DP and PT pulses are non palpable,   though toe is adequate temperature and does have had intact capillary refill Sensation intact right foot very tender to palpation about the right lateral forefoot Active drainage from the ulceration Erythema /dried blood surrounding the wound Edema of the forefoot     ASSESSMENT/PLAN OF CARE 88 y.o. male with PMHx significant for  significant of distress bundle branch block hyperlipidemia hypertension peripheral vascular disease, history of CVA with right-sided hemiparesis as well as questionable DM2 -A1c noted to be around the 6-6.5 range in the past With ulceration right lateral plantar forefoot with underlying osteomyelitis of the fifth metatarsal head and possibly fifth toe  WBC 11.9 ESR 72 XR foot R: Possible lucency involving the distal fifth metatarsal concerning for osteomyelitis MRI R foot: pending   - Recommend MRI right foot to evaluate extent of infection-  to be completed prior to transfer to [facility] -Will reach out to vascular to ensure they do not feel any angiogram is needed preoperatively. - NPO p MN for OR tomorrow for right partial 5th ray amputation vs isolated met head resection. To be transferred to [facility] later today. - Continue IV abx broad spectrum pending further culture data - Anticoagulation: Hold pending the OR tomorrow - Wound care: Betadine gauze dressing applied to the ulceration  preoperatively - WB status: Nonweightbearing to right foot - Will continue to follow   Thank you for the consult.  Please contact me directly with any questions or concerns.           Maridee Shoemaker, DPM [facility]    [address] [place], [place] [address]                Office [phone]  Fax [phone]

## 2023-12-10 NOTE — ED Triage Notes (Addendum)
 Patient BIB [facility] from home. Has a wound on the bottom of his right foot for 2 months. Oozing yellow pus and bright red blood. Started 2 antibiotics Friday amoxicillin and doxycycline. Right sided deficits from previous stroke. Denies injury to the foot.

## 2023-12-10 NOTE — ED Provider Notes (Signed)
 [place] EMERGENCY DEPARTMENT AT [facility] Provider Note   CSN: [id number] Arrival date & time: 12/10/23  7829     History  Chief Complaint  Patient presents with   Wound Infection    [name] is a 88 y.o. male.  HPI     88 year old patient comes in with chief complaint of wound infection.  Patient has history of diabetes, PAD, PVD and is currently being followed for his right plantar surface wound by podiatry team and wound care team.  Patient states that he recently was seen by wound care team, they proceeded to pack the wound.  He was advised to return to the ER if his symptoms get worse.  Patient states that he has noted increasing swelling and pain to his foot and he started bleeding from the surgical site, therefore he came to the ER.  Patient denies any fevers, chills.  Home Medications Prior to Admission medications   Medication Sig Start Date End Date Taking? Authorizing Provider  acetaminophen  (TYLENOL ) 325 MG tablet Take 2 tablets (650 mg total) by mouth every 4 (four) hours as needed for headache or mild pain. 11/30/19   Barrett, Erin R, PA-C  amLODipine  (NORVASC ) 2.5 MG tablet TAKE 1 TABLET(2.5 MG) BY MOUTH DAILY Patient taking differently: Take 2.5 mg by mouth daily. 09/30/23   Moore, Janece, FNP  amoxicillin-clavulanate (AUGMENTIN) 875-125 MG tablet Take 1 tablet by mouth 2 (two) times daily. 12/04/23 12/11/23  [provider]  aspirin  EC 81 MG tablet Take 81 mg by mouth daily.    [provider]  cetirizine (ZYRTEC) 10 MG tablet Take 10 mg by mouth as needed for allergies.    [provider]  Cholecalciferol (VITAMIN D) 50 MCG (2000 UT) tablet Take 2,000 Units by mouth daily.    [provider]  clopidogrel  (PLAVIX ) 75 MG tablet Take 1 tablet (75 mg total) by mouth daily. 11/11/23   Moore, Janece, FNP  doxycycline (VIBRA-TABS) 100 MG tablet Take 100 mg by mouth 2 (two) times daily. 12/06/23 12/20/23  [provider]  fluticasone  (FLONASE ) 50 MCG/ACT nasal spray Place 2 sprays into both nostrils daily. 05/30/20   Susanna Epley, FNP  Misc Natural Products (ELDERBERRY IMMUNE COMPLEX PO) Take by mouth daily.    [provider]  montelukast  (SINGULAIR ) 10 MG tablet Take 1 tablet by mouth daily 11/11/23   Moore, Janece, FNP  polyethylene glycol (MIRALAX  / GLYCOLAX ) 17 g packet Take 17 g by mouth daily. 11/05/19   Pokhrel, Laxman, MD  rosuvastatin  (CRESTOR ) 20 MG tablet TAKE 1 TABLET(20 MG) BY MOUTH DAILY Patient taking differently: Take 20 mg by mouth daily. 06/12/23   Susanna Epley, FNP  tamsulosin  (FLOMAX ) 0.4 MG CAPS capsule Take 1 capsule (0.4 mg total) by mouth daily. 11/06/21   Susanna Epley, FNP  zinc gluconate 50 MG tablet Take 50 mg by mouth daily.    [provider]      Allergies    Patient has no known allergies.    Review of Systems   Review of Systems  All other systems reviewed and are negative.   Physical Exam Updated Vital Signs BP (!) 151/101   Pulse 71   Temp 98.3 F (36.8 C) (Oral)   Resp 19   Ht 5\' 9"  (1.753 m)   Wt 80 kg   SpO2 99%   BMI 26.05 kg/m  Physical Exam Vitals and nursing note reviewed.  Constitutional:      Appearance: He  is well-developed.  HENT:     Head: Atraumatic.  Cardiovascular:     Rate and Rhythm: Normal rate.  Pulmonary:     Effort: Pulmonary effort is normal.  Musculoskeletal:     Cervical back: Neck supple.     Right lower leg: Edema present.  Skin:    General: Skin is warm.     Findings: Erythema present.  Neurological:     Mental Status: He is alert and oriented to person, place, and time.     ED Results / Procedures / Treatments   Labs (all labs ordered are listed, but only abnormal results are displayed) Labs Reviewed  COMPREHENSIVE METABOLIC PANEL WITH GFR - Abnormal; Notable for the following components:      Result Value   Glucose, Bld 105 (*)    Albumin  3.2 (*)    AST 79 (*)    ALT 95 (*)    All  other components within normal limits  CBC WITH DIFFERENTIAL/PLATELET - Abnormal; Notable for the following components:   WBC 11.9 (*)    RBC 3.97 (*)    Hemoglobin 12.8 (*)    HCT 37.9 (*)    Platelets 448 (*)    Monocytes Absolute 1.5 (*)    Eosinophils Absolute 1.1 (*)    Abs Immature Granulocytes 0.50 (*)    All other components within normal limits  PROTIME-INR - Abnormal; Notable for the following components:   Prothrombin Time 15.4 (*)    All other components within normal limits  URINALYSIS, W/ REFLEX TO CULTURE (INFECTION SUSPECTED) - Abnormal; Notable for the following components:   Color, Urine STRAW (*)    Hgb urine dipstick SMALL (*)    Protein, ur 30 (*)    All other components within normal limits  SEDIMENTATION RATE - Abnormal; Notable for the following components:   Sed Rate 72 (*)    All other components within normal limits  CULTURE, BLOOD (ROUTINE X 2)  CULTURE, BLOOD (ROUTINE X 2)  C-REACTIVE PROTEIN  I-STAT CG4 LACTIC ACID, ED    EKG None  Radiology DG Foot Complete Right Result Date: 12/10/2023 CLINICAL DATA:  Foot ulcer. EXAM: RIGHT FOOT COMPLETE - 3+ VIEW COMPARISON:  Dec 06, 2023. FINDINGS: There is no evidence of fracture or dislocation. There is no evidence of arthropathy. Lucency is seen involving the distal fifth metatarsal and possible osteomyelitis cannot be excluded. Overlying bandage and soft tissue wound is noted. Vascular calcifications are noted. IMPRESSION: Possible lucency involving distal fifth metatarsal suggesting possible osteomyelitis. MRI may be performed for further evaluation. Electronically Signed   By: Rosalene Colon M.D.   On: 12/10/2023 08:53    Procedures Procedures    Medications Ordered in ED Medications  cefTRIAXone  (ROCEPHIN ) 2 g in sodium chloride  0.9 % 100 mL IVPB (0 g Intravenous Stopped 12/10/23 1039)    ED Course/ Medical Decision Making/ A&P                                 Medical Decision Making Amount  and/or Complexity of Data Reviewed Labs: ordered. Radiology: ordered.  Risk Decision regarding hospitalization.   88 year old male with history of PAD, diabetes comes under chief complaint of foot swelling, bleeding.  Patient is currently being managed by wound care team, podiatry team.  I have reviewed patient's records including note from wound care yesterday and podiatry from April.  I have also reviewed vascular studies.  Based on patient's exam, differential diagnosis includes cellulitis, deep space infection such as osteomyelitis, myositis.  The wound is actively bleeding, but there is no purulent drainage.  The wound does not need active attention at this time clinically.  I ordered basic labs, x-ray of the foot.  X-ray of the foot was independently interpreted.  There was no gas seen.  However patient does have ? Osteo.  With patient having erythema, foot swelling, worsening of symptoms despite antibiotics and active wound care issues, plan is to proceed with admission.  Admitting team can get MRI to rule out osteo-.  Clinically patient is not septic.  2 g ceftriaxone  ordered for now.  Final Clinical Impression(s) / ED Diagnoses Final diagnoses:  Wound infection  Osteomyelitis of left foot, unspecified type [facility])    Rx / DC Orders ED Discharge Orders     None         Deatra Face, MD 12/10/23 1217

## 2023-12-10 NOTE — ED Notes (Addendum)
 [facility] Doctor present, states pt is to receive MRI here today then go to [facility] for Surgery tomorrow.

## 2023-12-10 NOTE — Consult Note (Incomplete)
 Hospital Consult    Reason for Consult: Right fifth toe wound Requesting Service/Physician: Standiford, MD  MRN #:  [id number]  History of Present Illness: This is a 88 y.o. male With HTN, HLD, PAD and history of a CVA with right-sided hemiparesis who presented to the emergency department with a right foot wound.  He has been seen by wound care and there is report that antibiotics were started on Friday, amoxicillin and doxycycline.  He is planned to undergo fifth toe amputation with Dr. Rosemarie Conquest. We last saw him about 1 year ago in April 2024.  He was noted to be asymptomatic at that time and denied claudication, rest pain or tissue loss.  At that time his ABI was noted to be 0.5 on the right with a toe pressure of 77 on the right and 0.99 with a toe pressure of 116 on the left.  He denies nocturnal rest pain. 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  Past Medical History:  Diagnosis Date   Arthritis    Bundle branch block    Hypercholesteremia    Hyperlipidemia 2001   Hypertension 2001   Hypertension    Peripheral vascular disease (HCC)    Stroke [facility])    Stroke (HCC)    2001 - R side deficits    Past Surgical History:  Procedure Laterality Date   ABDOMINAL AORTOGRAM N/A 11/18/2019   Procedure: ABDOMINAL AORTOGRAM;  Surgeon: Lucendia Rusk, MD;  Location: [facility] INVASIVE CV LAB;  Service: Cardiovascular;  Laterality: N/A;   CORONARY ARTERY BYPASS GRAFT N/A 11/20/2019   Procedure: CORONARY ARTERY BYPASS GRAFTING (CABG), ON PUMP, TIMES THREE, USING LEFT INTERNAL MAMMARY ARTERY AND ENDOSCOPICALLY HARVESTED RIGHT GREATER SAPHENOUS VEIN;  Surgeon: Rudine Cos, MD;  Location: [facility] OR;  Service: Open Heart Surgery;  Laterality: N/A;   LEFT HEART CATH AND CORONARY ANGIOGRAPHY N/A 11/18/2019   Procedure: LEFT HEART CATH AND CORONARY ANGIOGRAPHY;  Surgeon: Lucendia Rusk, MD;  Location: [facility] INVASIVE CV LAB;  Service: Cardiovascular;  Laterality: N/A;    TEE WITHOUT CARDIOVERSION N/A 11/20/2019   Procedure: TRANSESOPHAGEAL ECHOCARDIOGRAM (TEE);  Surgeon: Rudine Cos, MD;  Location: [facility] OR;  Service: Open Heart Surgery;  Laterality: N/A;    No Known Allergies  Prior to Admission medications   Medication Sig Start Date End Date Taking? Authorizing Provider  acetaminophen  (TYLENOL ) 325 MG tablet Take 2 tablets (650 mg total) by mouth every 4 (four) hours as needed for headache or mild pain. 11/30/19  Yes Barrett, Erin R, PA-C  amLODipine  (NORVASC ) 2.5 MG tablet TAKE 1 TABLET(2.5 MG) BY MOUTH DAILY Patient taking differently: Take 2.5 mg by mouth daily. 09/30/23  Yes Moore, Janece, FNP  amoxicillin-clavulanate (AUGMENTIN) 875-125 MG tablet Take 1 tablet by mouth 2 (two) times daily. 12/04/23 12/11/23 Yes [provider]  aspirin  EC 81 MG tablet Take 81 mg by mouth daily.   Yes [provider]  cetirizine (ZYRTEC) 10 MG tablet Take 10 mg by mouth as needed for allergies.   Yes [provider]  Cholecalciferol (VITAMIN D) 50 MCG (2000 UT) tablet Take 2,000 Units by mouth daily.   Yes [provider]  clopidogrel  (PLAVIX ) 75 MG tablet Take 1 tablet (75 mg total) by mouth daily. 11/11/23  Yes Susanna Epley, FNP  doxycycline (VIBRA-TABS) 100 MG tablet Take 100 mg by mouth 2 (two) times daily. 12/06/23 12/20/23 Yes [provider]  fluticasone  (FLONASE ) 50 MCG/ACT nasal spray Place 2 sprays into  both nostrils daily. 05/30/20  Yes Susanna Epley, FNP  Misc Natural Products (ELDERBERRY IMMUNE COMPLEX PO) Take 1 capsule by mouth daily.   Yes [provider]  montelukast  (SINGULAIR ) 10 MG tablet Take 1 tablet by mouth daily 11/11/23  Yes Moore, Janece, FNP  polyethylene glycol (MIRALAX  / GLYCOLAX ) 17 g packet Take 17 g by mouth daily. 11/05/19  Yes Pokhrel, Laxman, MD  rosuvastatin  (CRESTOR ) 20 MG tablet TAKE 1 TABLET(20 MG) BY MOUTH DAILY Patient taking differently: Take 20 mg by mouth daily. 06/12/23  Yes Susanna Epley, FNP  tamsulosin  (FLOMAX ) 0.4 MG CAPS capsule Take 1 capsule (0.4 mg total) by mouth daily. 11/06/21  Yes Susanna Epley, FNP  zinc gluconate 50 MG tablet Take 50 mg by mouth daily.   Yes [provider]    Social History   Socioeconomic History   Marital status: Single    Spouse name: Not on file   Number of children: Not on file   Years of education: Not on file   Highest education level: Not on file  Occupational History   Occupation: retired  Tobacco Use   Smoking status: Never   Smokeless tobacco: Never  Vaping Use   Vaping status: Not on file  Substance and Sexual Activity   Alcohol use: Never   Drug use: Never   Sexual activity: Not Currently  Other Topics Concern   Not on file  Social History Narrative   ** Merged History Encounter **       Social Drivers of Health   Financial Resource Strain: Low Risk  (05/29/2023)   Overall Financial Resource Strain (CARDIA)    Difficulty of Paying Living Expenses: Not hard at all  Food Insecurity: No Food Insecurity (05/29/2023)   Hunger Vital Sign    Worried About Running Out of Food in the Last Year: Never true    Ran Out of Food in the Last Year: Never true  Transportation Needs: No Transportation Needs (05/29/2023)   PRAPARE - Administrator, Civil Service (Medical): No    Lack of Transportation (Non-Medical): No  Physical Activity: Insufficiently Active (05/29/2023)   Exercise Vital Sign    Days of Exercise per Week: 3 days    Minutes of Exercise per Session: 20 min  Stress: No Stress Concern Present (05/29/2023)   [employer] of Occupational Health - Occupational Stress Questionnaire    Feeling of Stress : Not at all  Social Connections: Socially Isolated (05/29/2023)   Social Connection and Isolation Panel [NHANES]    Frequency of Communication with Friends and Family: More than three times a week    Frequency of Social Gatherings with Friends and Family: More than three times a week     Attends Religious Services: Never    Database administrator or Organizations: No    Attends Banker Meetings: Never    Marital Status: Never married  Intimate Partner Violence: Not At Risk (05/29/2023)   Humiliation, Afraid, Rape, and Kick questionnaire    Fear of Current or Ex-Partner: No    Emotionally Abused: No    Physically Abused: No    Sexually Abused: No    Family History  Problem Relation Age of Onset   Stroke Mother    Stroke Father    Heart attack Father    Cancer Brother     ROS: Otherwise negative unless mentioned in HPI  Physical Examination  Vitals:   12/10/23 1400 12/10/23 1607  BP: (!) 165/75 Aaron Aas)  167/72  Pulse: 65 66  Resp: 17 16  Temp:  99.4 F (37.4 C)  SpO2: 99% 99%   Body mass index is 26.05 kg/m.  General: no acute distress Cardiac: hemodynamically stable Pulm: normal work of breathing Abdomen: non-tender, no pulsatile mass  Neuro: alert, no focal deficit Extremities: No edema or cyanosis, right fifth toe wound as pictured below   Vascular:   Right: Nonpalpable femoral, nonpalpable pedal's  Left: Palpable femoral, DP   Data:   CT abdomen pelvis from 2021 was reviewed,  demonstrates significant calcific disease of the infrarenal aorta and bilateral iliac systems.  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  ASSESSMENT/PLAN: This is a 88 y.o. male with a right lateral foot wound overlying the fifth metatarsal head.  I discussed with Dr. America Bake that a toe pressure of 77 would indicate that he does have adequate perfusion for healing.  He would ultimately require an extra-anatomic bypass for revascularization which at this time I would not not offer as he is not currently having rest pain.  If the amputation site is nonhealing we can discuss extra-anatomic revascularization but at this time his perfusion should be adequate.   Philipp Brawn  MD [facility] [phone] 12/10/2023  6:31 PM

## 2023-12-10 NOTE — H&P (Signed)
 History and Physical    Patient: [name] UEA:[id number] DOB: January 24, 1936 DOA: 12/10/2023 DOS: the patient was seen and examined on 12/10/2023 PCP: Susanna Epley, FNP  Patient coming from: Home  Chief Complaint:  Chief Complaint  Patient presents with   Wound Infection   HPI: [name] is a 88 y.o. male with medical history significant of distress bundle branch block hyperlipidemia hypertension peripheral vascular disease, history of CVA with right-sided hemiparesis who presented to the emergency department with right foot wound infection for the past 2 months.  He was seen by wound care on Friday and was started on amoxicillin and doxycycline last week. He denied fever, chills, rhinorrhea, sore throat, wheezing or hemoptysis.  No chest pain, palpitations, diaphoresis, PND, orthopnea or pitting edema of the lower extremities.  No abdominal pain, nausea, emesis, diarrhea, constipation, melena or hematochezia.  No flank pain, dysuria, frequency or hematuria.  No polyuria, polydipsia or polyphagia.  Lab work: Urinalysis with small hemoglobin and protein of 30 mg/dL.  Lactic acid was normal.  CBC showed a white count of 11.9 with 61% neutrophils, hemoglobin 12.8 g/dL platelets 478.  Sed rate was at 72 mm/h.  PT 15.4 and INR 1.2.  CRP 10.8 mg/dL.  CMP showed a glucose of 105 mg deciliter, albumin  3.2 g/dL, AST 79 and ALT 95 units/L.  The rest of the CMP measurements were normal.  Imaging: Right foot x-ray showed possible lucency involving distal fifth metatarsal suggesting possible osteomyelitis.  MRI recommended.  MRI of the right foot showed an open wound along the plantar aspect of the lateral forefoot with possible partial exposure of the fifth metatarsal head.  Posterior dislocation of the fifth metatarsophalangeal joint with associated osteomyelitis of the fifth metatarsal head and neck.  Moderate effusion of the fifth MTP joint, suspicious for septic arthritis.  Possible septic  tenosynovitis so that Dr. DVT may need tendon which is posterolateral suture loose within the small toe.  No organized articular fluid collection.  ED course: Initial vital signs were temperature 98.2 F, pulse 71, respiration 19, BP 151/101 mmHg O2 sat 99% on room air.  He received ceftriaxone  2 g IVPB.  He was seen by podiatry in the emergency department.   Review of Systems: As mentioned in the history of present illness. All other systems reviewed and are negative. Past Medical History:  Diagnosis Date   Arthritis    Bundle branch block    Hypercholesteremia    Hyperlipidemia 2001   Hypertension 2001   Hypertension    Peripheral vascular disease (HCC)    Stroke [facility])    Stroke (HCC)    2001 - R side deficits   Past Surgical History:  Procedure Laterality Date   ABDOMINAL AORTOGRAM N/A 11/18/2019   Procedure: ABDOMINAL AORTOGRAM;  Surgeon: Lucendia Rusk, MD;  Location: [facility] INVASIVE CV LAB;  Service: Cardiovascular;  Laterality: N/A;   CORONARY ARTERY BYPASS GRAFT N/A 11/20/2019   Procedure: CORONARY ARTERY BYPASS GRAFTING (CABG), ON PUMP, TIMES THREE, USING LEFT INTERNAL MAMMARY ARTERY AND ENDOSCOPICALLY HARVESTED RIGHT GREATER SAPHENOUS VEIN;  Surgeon: Rudine Cos, MD;  Location: [facility] OR;  Service: Open Heart Surgery;  Laterality: N/A;   LEFT HEART CATH AND CORONARY ANGIOGRAPHY N/A 11/18/2019   Procedure: LEFT HEART CATH AND CORONARY ANGIOGRAPHY;  Surgeon: Lucendia Rusk, MD;  Location: [facility] INVASIVE CV LAB;  Service: Cardiovascular;  Laterality: N/A;   TEE WITHOUT CARDIOVERSION N/A 11/20/2019   Procedure: TRANSESOPHAGEAL ECHOCARDIOGRAM (TEE);  Surgeon: Rudine Cos,  MD;  Location: [facility] OR;  Service: Open Heart Surgery;  Laterality: N/A;   Social History:  reports that he has never smoked. He has never used smokeless tobacco. He reports that he does not drink alcohol and does not use drugs.  No Known Allergies  Family History  Problem Relation Age of Onset   Stroke  Mother    Stroke Father    Heart attack Father    Cancer Brother     Prior to Admission medications   Medication Sig Start Date End Date Taking? Authorizing Provider  acetaminophen  (TYLENOL ) 325 MG tablet Take 2 tablets (650 mg total) by mouth every 4 (four) hours as needed for headache or mild pain. 11/30/19   Barrett, Erin R, PA-C  amLODipine  (NORVASC ) 2.5 MG tablet TAKE 1 TABLET(2.5 MG) BY MOUTH DAILY 09/30/23   Susanna Epley, FNP  aspirin  EC 81 MG tablet Take 81 mg by mouth daily.    [provider]  cetirizine (ZYRTEC) 10 MG tablet Take 10 mg by mouth as needed for allergies.    [provider]  Cholecalciferol (VITAMIN D) 50 MCG (2000 UT) tablet Take 2,000 Units by mouth daily.    [provider]  clopidogrel  (PLAVIX ) 75 MG tablet Take 1 tablet (75 mg total) by mouth daily. 11/11/23   Susanna Epley, FNP  fluticasone  (FLONASE ) 50 MCG/ACT nasal spray Place 2 sprays into both nostrils daily. 05/30/20   Susanna Epley, FNP  Misc Natural Products (ELDERBERRY IMMUNE COMPLEX PO) Take by mouth daily.    [provider]  montelukast  (SINGULAIR ) 10 MG tablet Take 1 tablet by mouth daily 11/11/23   Moore, Janece, FNP  polyethylene glycol (MIRALAX  / GLYCOLAX ) 17 g packet Take 17 g by mouth daily. 11/05/19   Pokhrel, Laxman, MD  rosuvastatin  (CRESTOR ) 20 MG tablet TAKE 1 TABLET(20 MG) BY MOUTH DAILY 06/12/23   Susanna Epley, FNP  silver  sulfADIAZINE  (SILVADENE ) 1 % cream Apply 1 Application topically daily. 09/30/23   Sikora, Rebecca, DPM  tamsulosin  (FLOMAX ) 0.4 MG CAPS capsule Take 1 capsule (0.4 mg total) by mouth daily. 11/06/21   Susanna Epley, FNP  zinc gluconate 50 MG tablet Take 50 mg by mouth daily.    [provider]    Physical Exam: Vitals:   12/10/23 0741 12/10/23 0745 12/10/23 0746  BP:  (!) 151/101   Pulse:   71  Resp:  19   Temp:  98.3 F (36.8 C)   TempSrc:  Oral   SpO2:   99%  Weight: 80 kg    Height: 5\' 9"  (1.753 m)     Physical  Exam Vitals and nursing note reviewed.  Constitutional:      General: He is awake. He is not in acute distress.    Appearance: Normal appearance. He is ill-appearing.  HENT:     Head: Normocephalic.     Comments: Moderate to severe hearing impairment.    Nose: No rhinorrhea.     Mouth/Throat:     Mouth: Mucous membranes are moist.  Eyes:     General: No scleral icterus.    Pupils: Pupils are equal, round, and reactive to light.  Neck:     Vascular: No JVD.  Cardiovascular:     Rate and Rhythm: Normal rate and regular rhythm.     Heart sounds: S1 normal and S2 normal.  Pulmonary:     Effort: Pulmonary effort is normal.     Breath sounds: Normal breath sounds. No wheezing, rhonchi or rales.  Abdominal:     General: Bowel sounds are normal. There is no distension.     Palpations: Abdomen is soft.     Tenderness: There is no abdominal tenderness. There is no guarding.  Musculoskeletal:     Cervical back: Neck supple.     Right lower leg: No edema.     Left lower leg: No edema.  Skin:    General: Skin is warm and dry.  Neurological:     Mental Status: He is alert and oriented to person, place, and time.  Psychiatric:        Mood and Affect: Mood normal.        Behavior: Behavior normal. Behavior is cooperative.     Data Reviewed:  Results are pending, will review when available.  Assessment and Plan: Principal Problem:   Pyogenic inflammation of bone (HCC)   Acute osteomyelitis of phalanx of right foot (HCC) In the setting of:  PAD (peripheral artery disease) (HCC) Plan:   Prediabetes Admit to telemetry/inpatient. Analgesics as needed. Antiemetics as needed. Continue ceftriaxone  1 g IVPB daily. Follow-up CBC and chemistry in the morning. Podiatry consult appreciated. Vascular surgery consult appreciated.  Active Problems:   History of CVA with residual deficit   Right spastic hemiplegia (HCC) Supportive care. Will hold chemical prophylaxis for now. Will  hold aspirin  and clopidogrel  today. Will resume DAPT postop.    Essential hypertension Continue amlodipine  2.5 mg p.o. daily.    Pulmonary hypertension, unspecified (HCC) Continue amlodipine  2.5 mg p.o. daily.    Paroxysmal atrial fibrillation (HCC) Not on anticoagulation. Rate is controlled.    Mixed hyperlipidemia Will hold rosuvastatin  20 mg p.o. daily. Due to:   Transaminitis Follow LFTs in AM.    Mild protein malnutrition (HCC) In the setting of anemia and malignancy. May benefit from protein supplementation. Consider nutritional services evaluation. Follow-up albumin  level in AM..    Thrombocytosis Secondary to:   Normocytic anemia And acute illness as above. Also on DAPT. Follow-up CBC in AM.     Advance Care Planning:   Code Status: Full Code   Consults: Podiatry and vascular surgery.  Family Communication:   Severity of Illness: The appropriate patient status for this patient is INPATIENT. Inpatient status is judged to be reasonable and necessary in order to provide the required intensity of service to ensure the patient's safety. The patient's presenting symptoms, physical exam findings, and initial radiographic and laboratory data in the context of their chronic comorbidities is felt to place them at high risk for further clinical deterioration. Furthermore, it is not anticipated that the patient will be medically stable for discharge from the hospital within 2 midnights of admission.   * I certify that at the point of admission it is my clinical judgment that the patient will require inpatient hospital care spanning beyond 2 midnights from the point of admission due to high intensity of service, high risk for further deterioration and high frequency of surveillance required.*  Author: Danice Dural, MD 12/10/2023 10:29 AM  For on call review [email or web address].   This document was prepared using Dragon voice recognition software and may contain some  unintended transcription errors.

## 2023-12-10 NOTE — ED Notes (Addendum)
 Patient transported to MRI

## 2023-12-10 NOTE — ED Notes (Signed)
 Patient back from MRI.

## 2023-12-11 ENCOUNTER — Encounter (HOSPITAL_COMMUNITY)
Admission: EM | Disposition: A | Discharge status: Skilled Nursing Facility | Payer: Self-pay | Source: Home / Self Care | Attending: Family Medicine

## 2023-12-11 ENCOUNTER — Encounter (HOSPITAL_COMMUNITY): Payer: Self-pay | Admitting: Internal Medicine

## 2023-12-11 ENCOUNTER — Inpatient Hospital Stay (HOSPITAL_COMMUNITY): Payer: Self-pay | Admitting: Anesthesiology

## 2023-12-11 ENCOUNTER — Other Ambulatory Visit: Payer: Self-pay

## 2023-12-11 ENCOUNTER — Inpatient Hospital Stay (HOSPITAL_COMMUNITY)

## 2023-12-11 DIAGNOSIS — I252 Old myocardial infarction: Secondary | ICD-10-CM

## 2023-12-11 DIAGNOSIS — I10 Essential (primary) hypertension: Secondary | ICD-10-CM | POA: Diagnosis not present

## 2023-12-11 DIAGNOSIS — I2510 Atherosclerotic heart disease of native coronary artery without angina pectoris: Secondary | ICD-10-CM

## 2023-12-11 DIAGNOSIS — M869 Osteomyelitis, unspecified: Secondary | ICD-10-CM | POA: Diagnosis not present

## 2023-12-11 DIAGNOSIS — M86171 Other acute osteomyelitis, right ankle and foot: Secondary | ICD-10-CM | POA: Diagnosis not present

## 2023-12-11 HISTORY — PX: AMPUTATION: SHX166

## 2023-12-11 LAB — CBC
HCT: 35.8 % — ABNORMAL LOW (ref 39.0–52.0)
Hemoglobin: 12.2 g/dL — ABNORMAL LOW (ref 13.0–17.0)
MCH: 31.9 pg (ref 26.0–34.0)
MCHC: 34.1 g/dL (ref 30.0–36.0)
MCV: 93.7 fL (ref 80.0–100.0)
Platelets: 419 10*3/uL — ABNORMAL HIGH (ref 150–400)
RBC: 3.82 MIL/uL — ABNORMAL LOW (ref 4.22–5.81)
RDW: 15.6 % — ABNORMAL HIGH (ref 11.5–15.5)
WBC: 12 10*3/uL — ABNORMAL HIGH (ref 4.0–10.5)
nRBC: 0 % (ref 0.0–0.2)

## 2023-12-11 LAB — COMPREHENSIVE METABOLIC PANEL WITH GFR
ALT: 72 U/L — ABNORMAL HIGH (ref 0–44)
AST: 55 U/L — ABNORMAL HIGH (ref 15–41)
Albumin: 2.6 g/dL — ABNORMAL LOW (ref 3.5–5.0)
Alkaline Phosphatase: 70 U/L (ref 38–126)
Anion gap: 10 (ref 5–15)
BUN: 10 mg/dL (ref 8–23)
CO2: 24 mmol/L (ref 22–32)
Calcium: 8.9 mg/dL (ref 8.9–10.3)
Chloride: 104 mmol/L (ref 98–111)
Creatinine, Ser: 1.02 mg/dL (ref 0.61–1.24)
GFR, Estimated: >60 mL/min (ref >60)
Glucose, Bld: 100 mg/dL — ABNORMAL HIGH (ref 70–99)
Potassium: 4 mmol/L (ref 3.5–5.1)
Sodium: 138 mmol/L (ref 135–145)
Total Bilirubin: 1 mg/dL (ref 0.0–1.2)
Total Protein: 6.3 g/dL — ABNORMAL LOW (ref 6.5–8.1)

## 2023-12-11 LAB — GLUCOSE, CAPILLARY
Glucose-Capillary: 102 mg/dL — ABNORMAL HIGH (ref 70–99)
Glucose-Capillary: 83 mg/dL (ref 70–99)
Glucose-Capillary: 127 mg/dL — ABNORMAL HIGH (ref 70–99)
Glucose-Capillary: 107 mg/dL — ABNORMAL HIGH (ref 70–99)

## 2023-12-11 LAB — MRSA NEXT GEN BY PCR, NASAL: MRSA by PCR Next Gen: NOT DETECTED

## 2023-12-11 SURGERY — AMPUTATION, FOOT, RAY
Anesthesia: Monitor Anesthesia Care | Site: Fifth Toe | Laterality: Right

## 2023-12-11 MED ORDER — LACTATED RINGERS IV SOLN: INTRAVENOUS | Status: DC

## 2023-12-11 MED ORDER — OXYCODONE HCL 5 MG PO TABS: 5.0000 mg | ORAL_TABLET | Freq: Once | ORAL | Status: DC | PRN

## 2023-12-11 MED ORDER — ORAL CARE MOUTH RINSE: 15.0000 mL | Freq: Once | OROMUCOSAL | Status: AC

## 2023-12-11 MED ORDER — CHLORHEXIDINE GLUCONATE 0.12 % MT SOLN: 15.0000 mL | Freq: Once | OROMUCOSAL | Status: AC

## 2023-12-11 MED ORDER — MEPERIDINE HCL 25 MG/ML IJ SOLN: 6.2500 mg | INTRAMUSCULAR | Status: DC | PRN

## 2023-12-11 MED ORDER — ONDANSETRON HCL 4 MG/2ML IJ SOLN: 4.0000 mg | Freq: Once | INTRAMUSCULAR | Status: DC | PRN

## 2023-12-11 MED ORDER — PROPOFOL 500 MG/50ML IV EMUL
INTRAVENOUS | Status: DC | PRN
  Administered 2023-12-11: 125 ug/kg/min via INTRAVENOUS
  Administered 2023-12-11: 10 mg via INTRAVENOUS

## 2023-12-11 MED ORDER — FENTANYL CITRATE (PF) 250 MCG/5ML IJ SOLN
INTRAMUSCULAR | Status: DC | PRN
  Administered 2023-12-11: 25 ug via INTRAVENOUS

## 2023-12-11 MED ORDER — VANCOMYCIN HCL 500 MG IV SOLR
INTRAVENOUS | Status: DC | PRN
  Administered 2023-12-11: 500 mg

## 2023-12-11 MED ORDER — CHLORHEXIDINE GLUCONATE 0.12 % MT SOLN
OROMUCOSAL | Status: AC
Start: 2023-12-11 — End: 2023-12-11
  Administered 2023-12-11: 15 mL via OROMUCOSAL
  Filled 2023-12-11: qty 15

## 2023-12-11 MED ORDER — SODIUM CHLORIDE 0.9 % IR SOLN
Status: DC | PRN
  Administered 2023-12-11: 3000 mL

## 2023-12-11 MED ORDER — BUPIVACAINE HCL (PF) 0.5 % IJ SOLN
INTRAMUSCULAR | Status: DC | PRN
  Administered 2023-12-11: 5 mL

## 2023-12-11 MED ORDER — TOBRAMYCIN SULFATE 80 MG/2ML IJ SOLN
INTRAMUSCULAR | Status: AC
  Filled 2023-12-11: qty 4

## 2023-12-11 MED ORDER — BUPIVACAINE HCL (PF) 0.5 % IJ SOLN
INTRAMUSCULAR | Status: AC
  Filled 2023-12-11: qty 30

## 2023-12-11 MED ORDER — LIDOCAINE HCL (PF) 1 % IJ SOLN
INTRAMUSCULAR | Status: AC
  Filled 2023-12-11: qty 30

## 2023-12-11 MED ORDER — FENTANYL CITRATE (PF) 100 MCG/2ML IJ SOLN: 25.0000 ug | INTRAMUSCULAR | Status: DC | PRN

## 2023-12-11 MED ORDER — OXYCODONE HCL 5 MG/5ML PO SOLN: 5.0000 mg | Freq: Once | ORAL | Status: DC | PRN

## 2023-12-11 MED ORDER — VANCOMYCIN HCL 500 MG IV SOLR
INTRAVENOUS | Status: AC
  Filled 2023-12-11: qty 10

## 2023-12-11 MED ORDER — TOBRAMYCIN SULFATE 80 MG/2ML IJ SOLN
INTRAMUSCULAR | Status: DC | PRN
  Administered 2023-12-11: 40 mg via INTRAMUSCULAR
  Administered 2023-12-11: 80 mg via INTRAMUSCULAR

## 2023-12-11 MED ORDER — ONDANSETRON HCL 4 MG/2ML IJ SOLN
INTRAMUSCULAR | Status: DC | PRN
  Administered 2023-12-11: 4 mg via INTRAVENOUS

## 2023-12-11 MED ORDER — EPHEDRINE SULFATE-NACL 50-0.9 MG/10ML-% IV SOSY
PREFILLED_SYRINGE | INTRAVENOUS | Status: DC | PRN
  Administered 2023-12-11: 5 mg via INTRAVENOUS
  Administered 2023-12-11: 10 mg via INTRAVENOUS

## 2023-12-11 MED ORDER — LIDOCAINE HCL (PF) 1 % IJ SOLN
INTRAMUSCULAR | Status: DC | PRN
  Administered 2023-12-11: 5 mL

## 2023-12-11 MED ORDER — OXYCODONE HCL 5 MG PO TABS
5.0000 mg | ORAL_TABLET | ORAL | Status: DC | PRN
  Administered 2023-12-11: 5 mg via ORAL
  Administered 2023-12-11: 10 mg via ORAL
  Administered 2023-12-12: 5 mg via ORAL
  Administered 2023-12-12 – 2023-12-17 (×10): 10 mg via ORAL
  Filled 2023-12-11: qty 2
  Filled 2023-12-11: qty 1
  Filled 2023-12-11 (×4): qty 2
  Filled 2023-12-11: qty 1
  Filled 2023-12-11 (×6): qty 2

## 2023-12-11 MED ORDER — 0.9 % SODIUM CHLORIDE (POUR BTL) OPTIME
TOPICAL | Status: DC | PRN
  Administered 2023-12-11: 1000 mL

## 2023-12-11 MED ORDER — FENTANYL CITRATE (PF) 250 MCG/5ML IJ SOLN
INTRAMUSCULAR | Status: AC
  Filled 2023-12-11: qty 5

## 2023-12-11 SURGICAL SUPPLY — 39 items
BEADS BIO ARTH CALC SULFAT 5CC (Bone Implant) IMPLANT
BLADE AVERAGE 25X9 (BLADE) IMPLANT
BLADE SURG 10 STRL SS (BLADE) ×1 IMPLANT
BLADE SURG 15 STRL LF DISP TIS (BLADE) ×1 IMPLANT
BNDG COHESIVE 3X5 TAN ST LF (GAUZE/BANDAGES/DRESSINGS) ×1 IMPLANT
BNDG ELASTIC 3INX 5YD STR LF (GAUZE/BANDAGES/DRESSINGS) ×1 IMPLANT
BNDG ELASTIC 4INX 5YD STR LF (GAUZE/BANDAGES/DRESSINGS) IMPLANT
BNDG ESMARK 4X9 LF (GAUZE/BANDAGES/DRESSINGS) ×1 IMPLANT
BNDG GAUZE DERMACEA FLUFF 4 (GAUZE/BANDAGES/DRESSINGS) IMPLANT
CHLORAPREP W/TINT 26 (MISCELLANEOUS) IMPLANT
DRSG ADAPTIC 3X8 NADH LF (GAUZE/BANDAGES/DRESSINGS) IMPLANT
DRSG XEROFORM 1X8 (GAUZE/BANDAGES/DRESSINGS) IMPLANT
ELECTRODE REM PT RTRN 9FT ADLT (ELECTROSURGICAL) ×1 IMPLANT
GAUZE PAD ABD 8X10 STRL (GAUZE/BANDAGES/DRESSINGS) IMPLANT
GAUZE SPONGE 2X2 STRL 8-PLY (GAUZE/BANDAGES/DRESSINGS) IMPLANT
GAUZE SPONGE 4X4 12PLY STRL (GAUZE/BANDAGES/DRESSINGS) ×1 IMPLANT
GAUZE STRETCH 2X75IN STRL (MISCELLANEOUS) ×1 IMPLANT
GAUZE XEROFORM 1X8 LF (GAUZE/BANDAGES/DRESSINGS) ×1 IMPLANT
GLOVE BIO SURGEON STRL SZ7.5 (GLOVE) ×1 IMPLANT
GLOVE BIOGEL PI IND STRL 7.5 (GLOVE) ×1 IMPLANT
GOWN STRL REUS W/ TWL LRG LVL3 (GOWN DISPOSABLE) ×2 IMPLANT
GRAFT SKIN WND MICRO 38 (Tissue) IMPLANT
KIT BASIN OR (CUSTOM PROCEDURE TRAY) ×1 IMPLANT
NDL HYPO 25X1 1.5 SAFETY (NEEDLE) ×1 IMPLANT
NEEDLE HYPO 25X1 1.5 SAFETY (NEEDLE) ×1 IMPLANT
PACK ORTHO EXTREMITY (CUSTOM PROCEDURE TRAY) ×1 IMPLANT
PADDING CAST ABS COTTON 4X4 ST (CAST SUPPLIES) ×2 IMPLANT
SET HNDPC FAN SPRY TIP SCT (DISPOSABLE) IMPLANT
SPIKE FLUID TRANSFER (MISCELLANEOUS) IMPLANT
STAPLER SKIN PROX 35W (STAPLE) IMPLANT
STOCKINETTE 4X48 STRL (DRAPES) IMPLANT
SUT ETHILON 3 0 FSLX (SUTURE) IMPLANT
SUT PROLENE 3 0 PS 2 (SUTURE) IMPLANT
SUT PROLENE 4 0 PS 2 18 (SUTURE) IMPLANT
SYR CONTROL 10ML LL (SYRINGE) ×1 IMPLANT
TUBE CONNECTING 12X1/4 (SUCTIONS) IMPLANT
UNDERPAD 30X36 HEAVY ABSORB (UNDERPADS AND DIAPERS) ×1 IMPLANT
WATER STERILE IRR 1000ML POUR (IV SOLUTION) ×1 IMPLANT
YANKAUER SUCT BULB TIP NO VENT (SUCTIONS) IMPLANT

## 2023-12-11 NOTE — Op Note (Signed)
 Full Operative Report  Date of Operation: 11:25 AM, 12/11/2023   Patient: [name] - 88 y.o. male  Surgeon: Evertt Hoe, DPM   Assistant: None  Diagnosis: osteomyelitis 5th ray  Procedure:  1.  Partial fifth ray amputation with excision of ulceration, right foot 2.  Application dissolvable antibiotic beads, right foot 3.  Application dermal allograft, 38 cm, right foot     Anesthesia: Monitor Anesthesia Care  No responsible provider has been recorded for the case.  Anesthesiologist: Rhenda Cedars, MD CRNA: Katrinka Parr, CRNA   Estimated Blood Loss: 20 mL  Hemostasis: 1) Anatomical dissection, mechanical compression, electrocautery 2) no tourniquet was used during the procedure  Implants: Implant Name Type Inv. Item Serial No. Manufacturer Lot No. LRB No. Used Action  BEADS BIO ARTH CALC SULFAT 5CC - [id number] Bone Implant BEADS BIO ARTH CALC SULFAT 5CC  [employer] [id number] Right 1 Implanted  GRAFT SKIN WND MICRO 38 - [id number] Tissue GRAFT SKIN WND MICRO 38  [employer] [id number] Right 1 Implanted    Materials: 3-0 Prolene, skin staples  Injectables: 1) Pre-operatively: 20 cc of 50:50 mixture 1%lidocaine  plain and 0.5% marcaine  plain 2) Post-operatively: None   Specimens: - Pathology: Distal fifth ray.  Proximal margin fifth metatarsal - Microbiology: Bone culture fifth metatarsal head   Antibiotics: IV antibiotics given per schedule on the floor  Drains: None  Complications: Patient tolerated the procedure well without complication.   Operative findings: As below in detailed report  Indications for Procedure: [name] presents to [facility], [place] with a chief complaint of draining ulceration plantar aspect fifth metatarsal head of the right foot with concern for underlying osteomyelitis on MRI.  Also concern for septic fifth MPJ possible fifth proximal phalanx osteo.  The patient has failed conservative  treatments of various modalities. At this time the patient has elected to proceed with surgical correction. All alternatives, risks, and complications of the procedures were thoroughly explained to the patient. Patient exhibits appropriate understanding of all discussion points and informed consent was signed and obtained in the chart with no guarantees to surgical outcome given or implied.  Description of Procedure: Patient was brought to the operating room. Patient remained on their hospital bed in the supine position. A surgical timeout was performed and all members of the operating room, the procedure, and the surgical site were identified. anesthesia occurred as per anesthesia record. Local anesthetic as previously described was then injected about the operative field in a local infiltrative block.  The operative lower extremity as noted above was then prepped and draped in the usual sterile manner. The following procedure then began.  Attention was directed to the RIGHT lower extremity. A racquet type incision was made over the dorsal  aspect of the fifth metatarsal, including the entire digit. A full thickness incision was made down to bone using a #15 blade. The incision was continued through the soft tissue down to the shaft of the 5 metatarsal.  The incision was also carried plantarly so as to ellipse out the plantar ulceration to form a lateral soft tissue flap.  3 cc of purulent drainage was noted near the metatarsal head.  Using a #15 blade, the digit was then disarticulated in its entirety at the metatarsophalangeal joint and freed of all soft tissue attachments. The specimen was passed off the field and sent for gross pathology. Next, a 15 blade was then used to free up the periosteum on the metatarsal shaft. Using  a sagittal saw, the metatarsal was cut in a dorsal distal to plantar proximal orientation and beveled. A bone culture from the fifth metatarsal head was taken at this time -the bone  was soft and necrotic consistent with osteomyelitis. The fifth metatarsal bone was passed off the field and sent with the fifth toe for pathology.  Then the sagittal saw was used to make a separate transverse osteotomy through the proximal midshaft of the fifth metatarsal -this bone was passed off the field and sent for proximal margin pathology.  All remaining non-viable and necrotic tissues were sharply resected and removed. Extensor and flexors tendons were grasped with a hemostat and cut proximally. The surgical site was then flushed with 3000ml of saline with pulse lavage.    Next antibiotic beads - dissolvable calcium  sulfate beads - mixed with vancomycin  and tobramycin were prepared on the back table. 5 cc of small beads were prepared. These beads were then mixed with 38 cm2 of Kerecis micronized dermal allograft in order to add structure, fill dead space and promote healing of the surgical site. Small amount of sterile saline was then added to make a slurry. This was then implanted in the surgical site/cavity.The skin flap was then approximated and  closed  under minimal tension with 3-0 Prolene and skin staples.   The surgical site was then dressed with Xeroform 4 x 4 ABD pad Kerlix Ace. The patient tolerated both the procedure and anesthesia well with vital signs stable throughout. The patient was transferred in good condition and all vital signs stable  from the OR to recovery under the discretion of anesthesia.  Condition: Vital signs stable, neurovascular status unchanged from preoperative   Surgical plan:  Expect clean surgical margin was able to excise the ulceration and closed the wound.  Recommend nonweightbearing for improved chance at wound healing.  Follow proximal margin surgical pathology to determine need for long-term antibiotic therapy hopefully he will not need that.  The patient will be nonweightbearing in a postop shoe to the operative limb until further instructed. The  dressing is to remain clean, dry, and intact. Will continue to follow unless noted elsewhere.   Russ Course, DPM [facility]

## 2023-12-11 NOTE — Progress Notes (Signed)
 [facility] ROUNDING NOTE [name]:[id number]  DOB: 07-Jan-1936  DOA: 12/10/2023  PCP: Susanna Epley, FNP  12/11/2023,1:21 PM  LOS: 1 day    Code Status: full   From:   home    Current Dispo: home   88 year old-at baseline uses cane for ambulation?  Motorized wheelchair HTN peripheral vascular disease CVA 2001 with right-sided weakness CABG X3 LIMA-LAD SVG-diagonal/RCA and left atrial appendage thrombus removal with ligation and application of clip Patient follows chronically with podiatry for right foot wound and was evaluated on 11/18/2023 with debridement of overlying callus and placed in the offloading shoe Patient was seen at wound care on 5/2 and started on amoxicillin and doxycycline because of oozing yellow pus and bright red blood from the area MR right foot showed open wound plantar aspect exposure fifth metatarsal head dislocation of fifth metatarsophalangeal joint and osteomyelitis fifth metatarsal head neck with septic tenosynovitis of abductor digiti minimi tendon 5/7 underwent fifth partial ray excision of ulceration right foot application of dissolvable beads and dermal allograft placement  Plan  Sepsis tenosynovitis osteomyelitis fifth metatarsal head Status post procedure as above-follow cultures X40 8 hours from time of surgery to determine if any growth if so we will ask infectious disease to see in consult For now continue ceftriaxone  1 g every 24 Pain control Tylenol  650 every 6 as needed mild pain, add Oxyir 5-10 mg every 4 as needed moderate and reserve Dilaudid for severe pain  Chronic stroke with right-sided deficits 2001 Resume aspirin  81 in a.m., Plavix  in a.m.  CABG X3 2023 Continues on Crestor  20 Norvasc  2.5 Antiplatelets as above  Paroxysmal A-fib?  CHADVASC >4 Not on any anticoagulation?  No rate control-monitor trends Keep on telemetry today  Prediabetes-A1c 6.0 Not on meds-outpatient follow-up  DVT prophylaxis: Resume Lovenox a.m. 5/8  Status is:  Inpatient Remains inpatient appropriate because:   Requires therapy eval safety assessment etc.  Subjective: Awake coherent eating lunch does not seem to be in pain is postop from procedure looks well overall   Objective + exam Vitals:   12/11/23 0913 12/11/23 1115 12/11/23 1130 12/11/23 1147  BP: (!) 149/70 (!) 109/53 139/69 137/69  Pulse: 63 88 76 73  Resp: 17 (!) 22 (!) 21 17  Temp: 98 F (36.7 C) 97.8 F (36.6 C) 97.8 F (36.6 C)   TempSrc: Oral     SpO2: 96% 93% 93% 94%  Weight: 80 kg     Height: 5\' 9"  (1.753 m)      Filed Weights   12/10/23 0741 12/11/23 0913  Weight: 80 kg 80 kg    Examination: EOMI NCAT no focal deficit no icterus no pallor no wheeze rales rhonchi Chest is clear no added sound heart-on telemetry he has PVCs multiform with some A-fib noted Abdominal soft no rebound no guarding ROM is intact Foot is bandaged and he is in a postop shoe I did not examine wound  Data Reviewed: reviewed   CBC    Component Value Date/Time   WBC 12.0 (H) 12/11/2023 0537   RBC 3.82 (L) 12/11/2023 0537   HGB 12.2 (L) 12/11/2023 0537   HGB 14.0 08/28/2023 1524   HCT 35.8 (L) 12/11/2023 0537   HCT 40.7 08/28/2023 1524   PLT 419 (H) 12/11/2023 0537   PLT 278 08/28/2023 1524   MCV 93.7 12/11/2023 0537   MCV 97 08/28/2023 1524   MCH 31.9 12/11/2023 0537   MCHC 34.1 12/11/2023 0537   RDW 15.6 (H) 12/11/2023 0537  RDW 16.2 (H) 08/28/2023 1524   LYMPHSABS 1.4 12/10/2023 0754   LYMPHSABS 1.7 08/28/2023 1524   MONOABS 1.5 (H) 12/10/2023 0754   EOSABS 1.1 (H) 12/10/2023 0754   EOSABS 0.1 08/28/2023 1524   BASOSABS 0.1 12/10/2023 0754   BASOSABS 0.0 08/28/2023 1524      Latest Ref Rng & Units 12/11/2023    5:37 AM 12/10/2023    7:54 AM 08/28/2023    3:24 PM  CMP  Glucose 70 - 99 mg/dL 440  102  96   BUN 8 - 23 mg/dL 10  13  11    Creatinine 0.61 - 1.24 mg/dL 7.25  3.66  4.40   Sodium 135 - 145 mmol/L 138  139  141   Potassium 3.5 - 5.1 mmol/L 4.0  3.6  4.2    Chloride 98 - 111 mmol/L 104  105  105   CO2 22 - 32 mmol/L 24  25  19    Calcium  8.9 - 10.3 mg/dL 8.9  9.0  9.9   Total Protein 6.5 - 8.1 g/dL 6.3  7.2  7.5   Total Bilirubin 0.0 - 1.2 mg/dL 1.0  1.2  1.2   Alkaline Phos 38 - 126 U/L 70  80  78   AST 15 - 41 U/L 55  79  35   ALT 0 - 44 U/L 72  95  53     Scheduled Meds:  amLODipine   2.5 mg Oral Daily   insulin  aspart  0-9 Units Subcutaneous TID WC   montelukast   10 mg Oral QHS   polyethylene glycol  17 g Oral Daily   tamsulosin   0.4 mg Oral Daily   Continuous Infusions:  cefTRIAXone  (ROCEPHIN )  IV 1 g (12/11/23 0840)    Time 46  Verlie Glisson, MD  [facility]

## 2023-12-11 NOTE — Progress Notes (Signed)
 Orthopedic Tech Progress Note Patient Details:  [name] [birth year] [id number]  Ortho Devices Type of Ortho Device: Postop shoe/boot Ortho Device/Splint Location: RLE Ortho Device/Splint Interventions: Application   Post Interventions Patient Tolerated: Well  [name] 12/11/2023, 12:10 PM

## 2023-12-11 NOTE — Anesthesia Preprocedure Evaluation (Addendum)
 Anesthesia Evaluation  Patient identified by MRN, date of birth, ID band Patient awake    Reviewed: Allergy & Precautions, H&P , NPO status , Patient's Chart, lab work & pertinent test results  Airway Mallampati: II  TM Distance: >3 FB Neck ROM: Full    Dental no notable dental hx. (+) Poor Dentition   Pulmonary neg pulmonary ROS   Pulmonary exam normal breath sounds clear to auscultation       Cardiovascular Exercise Tolerance: Good hypertension, Pt. on medications + CAD, + Past MI and + Peripheral Vascular Disease  negative cardio ROS Normal cardiovascular exam+ dysrhythmias  Rhythm:Regular Rate:Normal  ECHO 22   1. Left ventricular ejection fraction, by estimation, is 60 to 65%. The  left ventricle has normal function. The left ventricle has no regional  wall motion abnormalities. Left ventricular diastolic parameters are  consistent with Grade I diastolic  dysfunction (impaired relaxation). Elevated left ventricular end-diastolic  pressure.   2. Right ventricular systolic function is normal. The right ventricular  size is normal.   3. Right atrial size was mildly dilated.   4. The mitral valve is normal in structure. Trivial mitral valve  regurgitation. No evidence of mitral stenosis.   5. The aortic valve is tricuspid. Aortic valve regurgitation is not  visualized. No aortic stenosis is present.   6. Aortic dilatation noted. There is mild dilatation of the ascending  aorta, measuring 37 mm.   7. The inferior vena cava is normal in size with greater than 50%  respiratory variability, suggesting right atrial pressure of 3 mmHg.     Neuro/Psych CVA negative neurological ROS  negative psych ROS   GI/Hepatic negative GI ROS, Neg liver ROS,,,  Endo/Other  negative endocrine ROS    Renal/GU negative Renal ROS  negative genitourinary   Musculoskeletal negative musculoskeletal ROS (+) Arthritis ,    Abdominal    Peds negative pediatric ROS (+)  Hematology negative hematology ROS (+) Blood dyscrasia, anemia   Anesthesia Other Findings   Reproductive/Obstetrics negative OB ROS                             Anesthesia Physical Anesthesia Plan  ASA: 4  Anesthesia Plan: MAC   Post-op Pain Management: Minimal or no pain anticipated   Induction: Intravenous  PONV Risk Score and Plan: 1 and Propofol  infusion  Airway Management Planned: Natural Airway, Simple Face Mask and Nasal Cannula  Additional Equipment:   Intra-op Plan:   Post-operative Plan:   Informed Consent: I have reviewed the patients History and Physical, chart, labs and discussed the procedure including the risks, benefits and alternatives for the proposed anesthesia with the patient or authorized representative who has indicated his/her understanding and acceptance.       Plan Discussed with: Anesthesiologist and CRNA  Anesthesia Plan Comments: (MAC LOCAL)        Anesthesia Quick Evaluation

## 2023-12-11 NOTE — Progress Notes (Signed)
 History and Physical Interval Note:  12/11/2023 9:58 AM  [name]  has presented today for surgery, with the diagnosis of osteomyelitis of fifth ray right foot.  The various methods of treatment have been discussed with the patient and family. After consideration of risks, benefits and other options for treatment, the patient has consented to   Procedure(s) with comments: Right foot partial 5th ray amp (Right) - Right foot partial 5th ray amp as a surgical intervention.  The patient's history has been reviewed, patient examined, no change in status, stable for surgery.  I have reviewed the patient's chart and labs.  Questions were answered to the patient's satisfaction.     Karlene Overcast Donnette Macmullen

## 2023-12-11 NOTE — Anesthesia Postprocedure Evaluation (Signed)
 Anesthesia Post Note  Patient: [name]  Procedure(s) Performed: Right foot partial fifth ray amputation (Right: Fifth Toe)     Patient location during evaluation: PACU Anesthesia Type: MAC Level of consciousness: awake and alert Pain management: pain level controlled Vital Signs Assessment: post-procedure vital signs reviewed and stable Respiratory status: spontaneous breathing, nonlabored ventilation, respiratory function stable and patient connected to nasal cannula oxygen Cardiovascular status: stable and blood pressure returned to baseline Postop Assessment: no apparent nausea or vomiting Anesthetic complications: no   No notable events documented.  Last Vitals:  Vitals:   12/11/23 1459 12/11/23 2023  BP: 123/72 111/62  Pulse: 76 78  Resp: 16 17  Temp: 36.8 C (!) 36.3 C  SpO2: 97% 94%    Last Pain:  Vitals:   12/11/23 2023  TempSrc: Oral  PainSc:                  [name]

## 2023-12-11 NOTE — ED Notes (Signed)
 RN wasted Dilaudid 0.5 mg with Rockne Chyle, RN. Pyxis does not have pts name to waste med under pt name. Called pharmacy spoke with Moira Andrews to figure out how to get pt name back in pyxis to waste. Only call to pharmacy witnessed by [place], RN.

## 2023-12-11 NOTE — Progress Notes (Signed)
 Initial Nutrition Assessment  DOCUMENTATION CODES:   Not applicable  INTERVENTION:   - Ensure Enlive/Ensure Plus High Protein po BID, each supplement provides 350 kcal and 20 grams of protein  - 1 packet Juven BID to support wound healing, each packet provides 95 calories, 2.5 grams of protein (collagen), and 9.8 grams of carbohydrate  - MVI with minerals daily  NUTRITION DIAGNOSIS:   Increased nutrient needs related to post-op healing, wound healing as evidenced by estimated needs.  GOAL:   Patient will meet greater than or equal to 90% of their needs  MONITOR:   PO intake, Supplement acceptance, Labs, Weight trends, Skin  REASON FOR ASSESSMENT:   Consult Wound healing  ASSESSMENT:   88 year old male who presented to the ED on 12/623 with wound infection. PMH of HLD, HTN, PVD, PAD, DM, CVA with right-sided hemiparesis. Pt admitted with pyogenic inflammation of gone, acute osteomyelitis of phalanx of right foot.  5/06 - Heart Healthy/Carb Modified diet 5/07 - NPO  Pt currently in the OR for right foot partial 5th ray amputation. Unable to obtain diet and weight history at this time.  Reviewed weight history in chart. Noted pt with a weight loss of 3.9 kg or 4.6% from 04/26/23 to 12/11/23. This is not clinically significant for timeframe. Noted pt lost weight down to 75.8 kg in October 2024 but appears to have gained weight since then.  Pt with increased nutrient needs related to wound healing. Will order oral nutrition supplements and MVI with minerals to aid pt in meeting increased needs.  Medications reviewed and include: SSI TID with meals, miralax  17 grams daily, IV abx IVF: LR @ 10 mL/hr  Labs reviewed: elevated LFTs, CRP 10.8, WBC 12.0, hemoglobin A1C 6.0 on 08/28/23 CBG's: 99-106 x 24 hours  NUTRITION - FOCUSED PHYSICAL EXAM:  Unable to complete. Pt in OR.  Diet Order:   Diet Order             Diet NPO time specified Except for: Ice Chips  Diet effective  midnight                   EDUCATION NEEDS:   Not appropriate for education at this time  Skin:  Skin Assessment: Reviewed RN Assessment  Last BM:  12/10/23  Height:   Ht Readings from Last 1 Encounters:  12/11/23 5\' 9"  (1.753 m)    Weight:   Wt Readings from Last 1 Encounters:  12/11/23 80 kg    BMI:  Body mass index is 26.05 kg/m.  Estimated Nutritional Needs:   Kcal:  2000-2200  Protein:  100-115 grams  Fluid:  >2.0 L    Ernestina Headland, MS, RD, LDN Registered Dietitian II Please see AMi[facility] for contact information.

## 2023-12-11 NOTE — Transfer of Care (Signed)
 Immediate Anesthesia Transfer of Care Note  Patient: [name]  Procedure(s) Performed: Right foot partial fifth ray amputation (Right: Fifth Toe)  Patient Location: PACU  Anesthesia Type:MAC  Level of Consciousness: drowsy and patient cooperative  Airway & Oxygen Therapy: Patient Spontanous Breathing  Post-op Assessment: Report given to RN and Post -op Vital signs reviewed and stable  Post vital signs: Reviewed and stable  Last Vitals:  Vitals Value Taken Time  BP 109/53 12/11/23 1112  Temp    Pulse 86 12/11/23 1115  Resp 20 12/11/23 1115  SpO2 90 % 12/11/23 1115  Vitals shown include unfiled device data.  Last Pain:  Vitals:   12/11/23 0920  TempSrc:   PainSc: 5          Complications: No notable events documented.

## 2023-12-11 NOTE — Plan of Care (Signed)

## 2023-12-12 ENCOUNTER — Encounter (HOSPITAL_COMMUNITY): Payer: Self-pay | Admitting: Podiatry

## 2023-12-12 ENCOUNTER — Inpatient Hospital Stay (HOSPITAL_COMMUNITY)

## 2023-12-12 DIAGNOSIS — M869 Osteomyelitis, unspecified: Secondary | ICD-10-CM | POA: Diagnosis not present

## 2023-12-12 LAB — BASIC METABOLIC PANEL WITH GFR
Anion gap: 8 (ref 5–15)
BUN: 10 mg/dL (ref 8–23)
CO2: 23 mmol/L (ref 22–32)
Calcium: 8.5 mg/dL — ABNORMAL LOW (ref 8.9–10.3)
Chloride: 106 mmol/L (ref 98–111)
Creatinine, Ser: 0.96 mg/dL (ref 0.61–1.24)
GFR, Estimated: >60 mL/min (ref >60)
Glucose, Bld: 104 mg/dL — ABNORMAL HIGH (ref 70–99)
Potassium: 4.4 mmol/L (ref 3.5–5.1)
Sodium: 137 mmol/L (ref 135–145)

## 2023-12-12 LAB — CBC WITH DIFFERENTIAL/PLATELET
Abs Immature Granulocytes: 0 10*3/uL (ref 0.00–0.07)
Basophils Absolute: 0.4 10*3/uL — ABNORMAL HIGH (ref 0.0–0.1)
Basophils Relative: 3 %
Eosinophils Absolute: 1.2 10*3/uL — ABNORMAL HIGH (ref 0.0–0.5)
Eosinophils Relative: 10 %
HCT: 34 % — ABNORMAL LOW (ref 39.0–52.0)
Hemoglobin: 11.9 g/dL — ABNORMAL LOW (ref 13.0–17.0)
Lymphocytes Relative: 7 %
Lymphs Abs: 0.9 10*3/uL (ref 0.7–4.0)
MCH: 33.1 pg (ref 26.0–34.0)
MCHC: 35 g/dL (ref 30.0–36.0)
MCV: 94.7 fL (ref 80.0–100.0)
Monocytes Absolute: 1.2 10*3/uL — ABNORMAL HIGH (ref 0.1–1.0)
Monocytes Relative: 10 %
Neutro Abs: 8.6 10*3/uL — ABNORMAL HIGH (ref 1.7–7.7)
Neutrophils Relative %: 70 %
Platelets: 386 10*3/uL (ref 150–400)
RBC: 3.59 MIL/uL — ABNORMAL LOW (ref 4.22–5.81)
RDW: 15.5 % (ref 11.5–15.5)
WBC: 12.3 10*3/uL — ABNORMAL HIGH (ref 4.0–10.5)
nRBC: 0 % (ref 0.0–0.2)
nRBC: 0 /100{WBCs}

## 2023-12-12 LAB — GLUCOSE, CAPILLARY
Glucose-Capillary: 100 mg/dL — ABNORMAL HIGH (ref 70–99)
Glucose-Capillary: 163 mg/dL — ABNORMAL HIGH (ref 70–99)
Glucose-Capillary: 81 mg/dL (ref 70–99)
Glucose-Capillary: 113 mg/dL — ABNORMAL HIGH (ref 70–99)

## 2023-12-12 LAB — SURGICAL PATHOLOGY

## 2023-12-12 LAB — MAGNESIUM: Magnesium: 2.2 mg/dL (ref 1.7–2.4)

## 2023-12-12 MED ORDER — CLOPIDOGREL BISULFATE 75 MG PO TABS
75.0000 mg | ORAL_TABLET | Freq: Every day | ORAL | Status: DC
  Administered 2023-12-12 – 2023-12-17 (×6): 75 mg via ORAL
  Filled 2023-12-12 (×6): qty 1

## 2023-12-12 MED ORDER — ASPIRIN 81 MG PO TBEC
81.0000 mg | DELAYED_RELEASE_TABLET | Freq: Every day | ORAL | Status: DC
  Administered 2023-12-12 – 2023-12-17 (×6): 81 mg via ORAL
  Filled 2023-12-12 (×6): qty 1

## 2023-12-12 MED ORDER — ENOXAPARIN SODIUM 40 MG/0.4ML IJ SOSY
40.0000 mg | PREFILLED_SYRINGE | INTRAMUSCULAR | Status: DC
  Administered 2023-12-12 – 2023-12-17 (×6): 40 mg via SUBCUTANEOUS
  Filled 2023-12-12 (×6): qty 0.4

## 2023-12-12 NOTE — Plan of Care (Signed)
  Problem: Coping: Goal: Ability to adjust to condition or change in health will improve Outcome: Progressing   Problem: Fluid Volume: Goal: Ability to maintain a balanced intake and output will improve Outcome: Progressing   Problem: Skin Integrity: Goal: Risk for impaired skin integrity will decrease Outcome: Progressing   Problem: Tissue Perfusion: Goal: Adequacy of tissue perfusion will improve Outcome: Progressing   Problem: Education: Goal: Knowledge of General Education information will improve Description: Including pain rating scale, medication(s)/side effects and non-pharmacologic comfort measures Outcome: Progressing   Problem: Coping: Goal: Level of anxiety will decrease Outcome: Progressing   Problem: Elimination: Goal: Will not experience complications related to bowel motility Outcome: Progressing Goal: Will not experience complications related to urinary retention Outcome: Progressing   Problem: Pain Managment: Goal: General experience of comfort will improve and/or be controlled Outcome: Progressing

## 2023-12-12 NOTE — Plan of Care (Signed)
 Problem: Education: Goal: Ability to describe self-care measures that may prevent or decrease complications (Diabetes Survival Skills Education) will improve Outcome: Progressing Goal: Individualized Educational Video(s) Outcome: Progressing   Problem: Coping: Goal: Ability to adjust to condition or change in health will improve Outcome: Progressing   Problem: Fluid Volume: Goal: Ability to maintain a balanced intake and output will improve Outcome: Progressing   Problem: Health Behavior/Discharge Planning: Goal: Ability to identify and utilize available resources and services will improve Outcome: Progressing Goal: Ability to manage health-related needs will improve Outcome: Progressing   Problem: Metabolic: Goal: Ability to maintain appropriate glucose levels will improve Outcome: Progressing   Problem: Nutritional: Goal: Maintenance of adequate nutrition will improve Outcome: Progressing Goal: Progress toward achieving an optimal weight will improve Outcome: Progressing   Problem: Skin Integrity: Goal: Risk for impaired skin integrity will decrease Outcome: Progressing   Problem: Tissue Perfusion: Goal: Adequacy of tissue perfusion will improve Outcome: Progressing   Problem: Education: Goal: Knowledge of General Education information will improve Description: Including pain rating scale, medication(s)/side effects and non-pharmacologic comfort measures Outcome: Progressing   Problem: Health Behavior/Discharge Planning: Goal: Ability to manage health-related needs will improve Outcome: Progressing   Problem: Clinical Measurements: Goal: Ability to maintain clinical measurements within normal limits will improve Outcome: Progressing Goal: Will remain free from infection Outcome: Progressing Goal: Diagnostic test results will improve Outcome: Progressing Goal: Respiratory complications will improve Outcome: Progressing Goal: Cardiovascular complication will  be avoided Outcome: Progressing   Problem: Activity: Goal: Risk for activity intolerance will decrease Outcome: Progressing   Problem: Nutrition: Goal: Adequate nutrition will be maintained Outcome: Progressing   Problem: Coping: Goal: Level of anxiety will decrease Outcome: Progressing   Problem: Elimination: Goal: Will not experience complications related to bowel motility Outcome: Progressing Goal: Will not experience complications related to urinary retention Outcome: Progressing   Problem: Pain Managment: Goal: General experience of comfort will improve and/or be controlled Outcome: Progressing   Problem: Safety: Goal: Ability to remain free from injury will improve Outcome: Progressing   Problem: Skin Integrity: Goal: Risk for impaired skin integrity will decrease Outcome: Progressing   Problem: Clinical Measurements: Goal: Ability to avoid or minimize complications of infection will improve Outcome: Progressing   Problem: Skin Integrity: Goal: Skin integrity will improve Outcome: Progressing   Problem: Education: Goal: Ability to describe self-care measures that may prevent or decrease complications (Diabetes Survival Skills Education) will improve Outcome: Progressing Goal: Individualized Educational Video(s) Outcome: Progressing   Problem: Coping: Goal: Ability to adjust to condition or change in health will improve Outcome: Progressing   Problem: Fluid Volume: Goal: Ability to maintain a balanced intake and output will improve Outcome: Progressing   Problem: Health Behavior/Discharge Planning: Goal: Ability to identify and utilize available resources and services will improve Outcome: Progressing Goal: Ability to manage health-related needs will improve Outcome: Progressing   Problem: Metabolic: Goal: Ability to maintain appropriate glucose levels will improve Outcome: Progressing   Problem: Nutritional: Goal: Maintenance of adequate  nutrition will improve Outcome: Progressing Goal: Progress toward achieving an optimal weight will improve Outcome: Progressing   Problem: Skin Integrity: Goal: Risk for impaired skin integrity will decrease Outcome: Progressing   Problem: Tissue Perfusion: Goal: Adequacy of tissue perfusion will improve Outcome: Progressing   Problem: Education: Goal: Knowledge of General Education information will improve Description: Including pain rating scale, medication(s)/side effects and non-pharmacologic comfort measures Outcome: Progressing   Problem: Health Behavior/Discharge Planning: Goal: Ability to manage health-related needs will improve Outcome: Progressing  Problem: Clinical Measurements: Goal: Ability to maintain clinical measurements within normal limits will improve Outcome: Progressing Goal: Will remain free from infection Outcome: Progressing Goal: Diagnostic test results will improve Outcome: Progressing Goal: Respiratory complications will improve Outcome: Progressing Goal: Cardiovascular complication will be avoided Outcome: Progressing   Problem: Activity: Goal: Risk for activity intolerance will decrease Outcome: Progressing   Problem: Nutrition: Goal: Adequate nutrition will be maintained Outcome: Progressing   Problem: Coping: Goal: Level of anxiety will decrease Outcome: Progressing   Problem: Elimination: Goal: Will not experience complications related to bowel motility Outcome: Progressing Goal: Will not experience complications related to urinary retention Outcome: Progressing   Problem: Pain Managment: Goal: General experience of comfort will improve and/or be controlled Outcome: Progressing   Problem: Safety: Goal: Ability to remain free from injury will improve Outcome: Progressing   Problem: Skin Integrity: Goal: Risk for impaired skin integrity will decrease Outcome: Progressing   Problem: Clinical Measurements: Goal: Ability to  avoid or minimize complications of infection will improve Outcome: Progressing   Problem: Skin Integrity: Goal: Skin integrity will improve Outcome: Progressing

## 2023-12-12 NOTE — Plan of Care (Signed)

## 2023-12-12 NOTE — Progress Notes (Signed)
  Subjective:  Patient ID: [name], male    DOB: [birth year],  MRN: [id number]  Chief Complaint  Patient presents with   Wound Infection    DOS: 12/11/2023 Procedure: 1.  Partial fifth ray amputation with excision of ulceration, right foot 2.  Application dissolvable antibiotic beads, right foot 3.  Application dermal allograft, 38 cm, right foot  88 y.o. male seen for post op check.  Patient reports he is doing well this morning he denies much of any pain in the right foot.  Discussed findings from surgery as well as complete clearance of infection based on pathology result.  He has no questions or concerns this morning  Review of Systems: Negative except as noted in the HPI. Denies N/V/F/Ch.   Objective:   Constitutional Well developed. Well nourished.  Vascular Foot warm and well perfused. Capillary refill normal to all digits.   No calf pain with palpation  Neurologic Normal speech. Oriented to person, place, and time. Epicritic sensation diminished to right foot  Dermatologic Dressing is clean dry and intact  Orthopedic: Status post right partial fifth ray amputation   Radiographs: Amputation of fifth ray at the proximal aspect of the fifth metatarsal  Pathology: A. TOE, RIGHT FIFTH, AMPUTATION:  Ulceration with acute inflammation and acute osteomyelitis.   B. BONE, RIGHT FIFTH, PROXIMAL, MARGIN:  Benign bone and connective tissue.  Negative for osteomyelitis.   Micro: Pending  Assessment:   1. Wound infection   2. Osteomyelitis of left foot, unspecified type (HCC)   Status post right partial fifth ray amputation  Plan:  Patient was evaluated and treated and all questions answered.  POD # 1 s/p partial fifth ray amputation right foot -Progressing as expected postoperatively.  Appears to have full osseous clearance of infection based on pathology -XR: Expected postop changes -WB Status: Nonweightbearing in soft dressing while in bed and postop shoe while  up with PT -Sutures: Remain intact. -Medications/ABX: Continue antibiotics broad-spectrum pending further culture data, likely will be okay for 7 to 10 days p.o. antibiotics on discharge -Dressing: Remain intact will change tomorrow - F/u Plan: Will see patient tomorrow        Maridee Shoemaker, DPM [facility] / [facility]

## 2023-12-12 NOTE — Progress Notes (Signed)
 [facility] ROUNDING NOTE DUPREE HYERS JYN:[id number]  DOB: Jul 21, 1936  DOA: 12/10/2023  PCP: Susanna Epley, FNP  12/12/2023,10:29 AM  LOS: 2 days    Code Status: full   From:   home    Current Dispo: home   88 year old-at baseline uses cane for ambulation?  Motorized wheelchair HTN peripheral vascular disease CVA 2001 with right-sided weakness CABG X3 LIMA-LAD SVG-diagonal/RCA and left atrial appendage thrombus removal with ligation and application of clip Patient follows chronically with podiatry for right foot wound and was evaluated on 11/18/2023 with debridement of overlying callus and placed in the offloading shoe Patient was seen at wound care on 5/2 and started on amoxicillin and doxycycline because of oozing yellow pus and bright red blood from the area MR right foot showed open wound plantar aspect exposure fifth metatarsal head dislocation of fifth metatarsophalangeal joint and osteomyelitis fifth metatarsal head neck with septic tenosynovitis of abductor digiti minimi tendon 5/7 underwent fifth partial ray excision of ulceration right foot application of dissolvable beads and dermal allograft placement  Plan  Sepsis tenosynovitis osteomyelitis fifth metatarsal head Status post procedure as above- follow cultures /BC if any growth if so we will ask infectious disease to see in consult--so far ngtd continue ceftriaxone  1 g every 24 Pain mod control Tylenol  650 every 6 as needed mild pain, add Oxyir 5-10 mg every 4 as needed moderate and reserve Dilaudid for severe pain  Chronic stroke with right-sided deficits 2001 Resume aspirin  81 in a.m., Plavix  in a.m. I think he has chronic contractures of R hip--will r/o fracture with xray  CABG X3 2023 Continues on Crestor  20 Norvasc  2.5 Antiplatelets as above  Paroxysmal A-fib?  CHADVASC >4 Not on any anticoagulation?  No rate control-monitor trends  Prediabetes-A1c 6.0 Not on meds-outpatient follow-up  DVT prophylaxis: Resume Lovenox a.m.  5/8  Status is: Inpatient Remains inpatient appropriate because:   Requires therapy eval safety assessment etc. Called to Update Raenette Bumps, cousin on phone  [phone]--no asnwer  Subjective: Awake coherent Some pain not severe but some paroxsym's He seems a little HOH and doesn't understand some of my questions today He is having some R hip pain--says its been goping on for several weeks   Objective + exam Vitals:   12/11/23 1459 12/11/23 2023 12/11/23 2346 12/12/23 0459  BP: 123/72 111/62 137/74 (!) 144/67  Pulse: 76 78 81 67  Resp: 16 17 17 17   Temp: 98.2 F (36.8 C) (!) 97.4 F (36.3 C) 98.7 F (37.1 C) (!) 97.5 F (36.4 C)  TempSrc: Oral Oral Oral   SpO2: 97% 94% 98% 98%  Weight:      Height:       Filed Weights   12/10/23 0741 12/11/23 0913  Weight: 80 kg 80 kg    Examination:  EOMI NCAT no focal deficit no icterus no pallor no wheeze rales rhonchi Chest is clear no added sound heart- on telemetry he has PVCs multiform with some A-fib noted Abdominal soft no rebound no guarding ROM is intact, R hand ion splint but point tenderness to R hip over greater trochanter Foot is bandaged and he is in a postop shoe  Data Reviewed: reviewed   CBC    Component Value Date/Time   WBC 12.3 (H) 12/12/2023 0636   RBC 3.59 (L) 12/12/2023 0636   HGB 11.9 (L) 12/12/2023 0636   HGB 14.0 08/28/2023 1524   HCT 34.0 (L) 12/12/2023 0636   HCT 40.7 08/28/2023 1524   PLT 386 12/12/2023 0636  PLT 278 08/28/2023 1524   MCV 94.7 12/12/2023 0636   MCV 97 08/28/2023 1524   MCH 33.1 12/12/2023 0636   MCHC 35.0 12/12/2023 0636   RDW 15.5 12/12/2023 0636   RDW 16.2 (H) 08/28/2023 1524   LYMPHSABS 0.9 12/12/2023 0636   LYMPHSABS 1.7 08/28/2023 1524   MONOABS 1.2 (H) 12/12/2023 0636   EOSABS 1.2 (H) 12/12/2023 0636   EOSABS 0.1 08/28/2023 1524   BASOSABS 0.4 (H) 12/12/2023 0636   BASOSABS 0.0 08/28/2023 1524      Latest Ref Rng & Units 12/12/2023    6:36 AM 12/11/2023    5:37  AM 12/10/2023    7:54 AM  CMP  Glucose 70 - 99 mg/dL 409  811  914   BUN 8 - 23 mg/dL 10  10  13    Creatinine 0.61 - 1.24 mg/dL 7.82  9.56  2.13   Sodium 135 - 145 mmol/L 137  138  139   Potassium 3.5 - 5.1 mmol/L 4.4  4.0  3.6   Chloride 98 - 111 mmol/L 106  104  105   CO2 22 - 32 mmol/L 23  24  25    Calcium  8.9 - 10.3 mg/dL 8.5  8.9  9.0   Total Protein 6.5 - 8.1 g/dL  6.3  7.2   Total Bilirubin 0.0 - 1.2 mg/dL  1.0  1.2   Alkaline Phos 38 - 126 U/L  70  80   AST 15 - 41 U/L  55  79   ALT 0 - 44 U/L  72  95     Scheduled Meds:  amLODipine   2.5 mg Oral Daily   insulin  aspart  0-9 Units Subcutaneous TID WC   montelukast   10 mg Oral QHS   polyethylene glycol  17 g Oral Daily   tamsulosin   0.4 mg Oral Daily   Continuous Infusions:  cefTRIAXone  (ROCEPHIN )  IV 1 g (12/12/23 0809)    Time 26  Verlie Glisson, MD  [facility]

## 2023-12-13 ENCOUNTER — Encounter (HOSPITAL_BASED_OUTPATIENT_CLINIC_OR_DEPARTMENT_OTHER): Admitting: Internal Medicine

## 2023-12-13 DIAGNOSIS — M869 Osteomyelitis, unspecified: Secondary | ICD-10-CM | POA: Diagnosis not present

## 2023-12-13 LAB — CBC WITH DIFFERENTIAL/PLATELET
Abs Immature Granulocytes: 0.76 10*3/uL — ABNORMAL HIGH (ref 0.00–0.07)
Basophils Absolute: 0.1 10*3/uL (ref 0.0–0.1)
Basophils Relative: 0 %
Eosinophils Absolute: 1.1 10*3/uL — ABNORMAL HIGH (ref 0.0–0.5)
Eosinophils Relative: 9 %
HCT: 33.6 % — ABNORMAL LOW (ref 39.0–52.0)
Hemoglobin: 11.8 g/dL — ABNORMAL LOW (ref 13.0–17.0)
Immature Granulocytes: 6 %
Lymphocytes Relative: 10 %
Lymphs Abs: 1.3 10*3/uL (ref 0.7–4.0)
MCH: 32.7 pg (ref 26.0–34.0)
MCHC: 35.1 g/dL (ref 30.0–36.0)
MCV: 93.1 fL (ref 80.0–100.0)
Monocytes Absolute: 1.6 10*3/uL — ABNORMAL HIGH (ref 0.1–1.0)
Monocytes Relative: 12 %
Neutro Abs: 8.3 10*3/uL — ABNORMAL HIGH (ref 1.7–7.7)
Neutrophils Relative %: 63 %
Platelets: 397 10*3/uL (ref 150–400)
RBC: 3.61 MIL/uL — ABNORMAL LOW (ref 4.22–5.81)
RDW: 15.4 % (ref 11.5–15.5)
Smear Review: NORMAL
WBC: 13.2 10*3/uL — ABNORMAL HIGH (ref 4.0–10.5)
nRBC: 0 % (ref 0.0–0.2)

## 2023-12-13 LAB — BASIC METABOLIC PANEL WITH GFR
Anion gap: 8 (ref 5–15)
BUN: 10 mg/dL (ref 8–23)
CO2: 26 mmol/L (ref 22–32)
Calcium: 8.7 mg/dL — ABNORMAL LOW (ref 8.9–10.3)
Chloride: 103 mmol/L (ref 98–111)
Creatinine, Ser: 0.84 mg/dL (ref 0.61–1.24)
GFR, Estimated: >60 mL/min (ref >60)
Glucose, Bld: 99 mg/dL (ref 70–99)
Potassium: 3.7 mmol/L (ref 3.5–5.1)
Sodium: 137 mmol/L (ref 135–145)

## 2023-12-13 LAB — GLUCOSE, CAPILLARY
Glucose-Capillary: 108 mg/dL — ABNORMAL HIGH (ref 70–99)
Glucose-Capillary: 102 mg/dL — ABNORMAL HIGH (ref 70–99)
Glucose-Capillary: 153 mg/dL — ABNORMAL HIGH (ref 70–99)
Glucose-Capillary: 117 mg/dL — ABNORMAL HIGH (ref 70–99)

## 2023-12-13 MED ORDER — DOXYCYCLINE HYCLATE 100 MG PO TABS
100.0000 mg | ORAL_TABLET | Freq: Two times a day (BID) | ORAL | Status: DC
Start: 2023-12-14 — End: 2023-12-17
  Administered 2023-12-14 – 2023-12-17 (×7): 100 mg via ORAL
  Filled 2023-12-13 (×7): qty 1

## 2023-12-13 MED ORDER — JUVEN PO PACK
1.0000 | PACK | Freq: Two times a day (BID) | ORAL | Status: DC
  Administered 2023-12-13 – 2023-12-17 (×9): 1 via ORAL
  Filled 2023-12-13 (×10): qty 1

## 2023-12-13 MED ORDER — ADULT MULTIVITAMIN W/MINERALS CH
1.0000 | ORAL_TABLET | Freq: Every day | ORAL | Status: DC
  Administered 2023-12-13 – 2023-12-17 (×5): 1 via ORAL
  Filled 2023-12-13 (×6): qty 1

## 2023-12-13 MED ORDER — ENSURE ENLIVE PO LIQD
237.0000 mL | Freq: Two times a day (BID) | ORAL | Status: DC
Start: 2023-12-13 — End: 2023-12-17
  Administered 2023-12-13 – 2023-12-17 (×9): 237 mL via ORAL
  Filled 2023-12-13: qty 237

## 2023-12-13 NOTE — Progress Notes (Signed)
 [facility] ROUNDING NOTE [name]:[id number]  DOB: [birth year]  DOA: 12/10/2023  PCP: Susanna Epley, FNP  12/13/2023,10:59 AM  LOS: 3 days    Code Status: full   From:   home    Current Dispo: home   88 year old-at baseline uses cane for ambulation?  Motorized wheelchair HTN peripheral vascular disease CVA 2001 with right-sided weakness CABG X3 LIMA-LAD SVG-diagonal/RCA and left atrial appendage thrombus removal with ligation and application of clip Patient follows chronically with podiatry for right foot wound and was evaluated on 11/18/2023 with debridement of overlying callus and placed in the offloading shoe Patient was seen at wound care on 5/2 and started on amoxicillin and doxycycline because of oozing yellow pus and bright red blood from the area MR right foot showed open wound plantar aspect exposure fifth metatarsal head dislocation of fifth metatarsophalangeal joint and osteomyelitis fifth metatarsal head neck with septic tenosynovitis of abductor digiti minimi tendon 5/7 underwent fifth partial ray excision of ulceration right foot application of dissolvable beads and dermal allograft placement  Plan  Sepsis tenosynovitis osteomyelitis fifth metatarsal head--5/7 definitive procedure as above Wound culture only staph blood culture NGTD-transition to doxycycline for 2 weeks at 100 twice daily from ceftriaxone  1 g every 24--discussed with Dr. Rosemarie Conquest Pain mod control Tylenol  650 every 6 as needed mild pain, add Oxyir 5-10 mg every 4  mod, Dilaudid  for severe pain Await therapy eval's-has been ordered for the past 2 days-cannot discharge home  Chronic stroke with right-sided deficits 2001 Back on aspirin  81, Plavix  75 Probably needs rehab placement  Right hip pain Right hip x-rays show only arthritis with mild degenerative changes and no acute abnormality and he is moving it better  CABG X3 2023 Crestor  as outpatient continue Norvasc  2.5 Antiplatelets as above  Paroxysmal A-fib?   CHADVASC >4 Not on any anticoagulation?  No rate control-monitor trends  Prediabetes-A1c 6.0 Not on meds-outpatient follow-up-sliding scale show sugars predominantly below 120 therefore discontinue coverage  DVT prophylaxis: Lovenox   Status is: Inpatient Remains inpatient appropriate because:   Requires therapy eval safety assessment etc. Called to Update Raenette Bumps, cousin on phone on 5/8 [phone]--no asnwer  Subjective:  Coherent pleasant no distress happy about the wound and seems stable Hip pain seems a little bit better   Objective + exam Vitals:   12/12/23 1934 12/13/23 0000 12/13/23 0416 12/13/23 0803  BP: 126/66 (P) 129/68 128/82 119/67  Pulse: 77 (P) 62 65 66  Resp: 18 (P) 19    Temp: 98 F (36.7 C) (P) 98.1 F (36.7 C) 98 F (36.7 C) 97.6 F (36.4 C)  TempSrc: Oral (P) Oral Oral Oral  SpO2: 97% (P) 96% 95% 97%  Weight:      Height:       Filed Weights   12/10/23 0741 12/11/23 0913  Weight: 80 kg 80 kg    Examination:  EOMI NCAT no focal deficit no icterus no pallor no wheeze rales rhonchi Chest is clear no added sound heart- A-fib on monitors Wound not examined, pictures as per belowby Dr. Rosemarie Conquest seems well-healed   Data Reviewed: reviewed   CBC    Component Value Date/Time   WBC 13.2 (H) 12/13/2023 0559   RBC 3.61 (L) 12/13/2023 0559   HGB 11.8 (L) 12/13/2023 0559   HGB 14.0 08/28/2023 1524   HCT 33.6 (L) 12/13/2023 0559   HCT 40.7 08/28/2023 1524   PLT 397 12/13/2023 0559   PLT 278 08/28/2023 1524   MCV 93.1 12/13/2023 0559  MCV 97 08/28/2023 1524   MCH 32.7 12/13/2023 0559   MCHC 35.1 12/13/2023 0559   RDW 15.4 12/13/2023 0559   RDW 16.2 (H) 08/28/2023 1524   LYMPHSABS 1.3 12/13/2023 0559   LYMPHSABS 1.7 08/28/2023 1524   MONOABS 1.6 (H) 12/13/2023 0559   EOSABS 1.1 (H) 12/13/2023 0559   EOSABS 0.1 08/28/2023 1524   BASOSABS 0.1 12/13/2023 0559   BASOSABS 0.0 08/28/2023 1524      Latest Ref Rng & Units 12/13/2023    5:59 AM  12/12/2023    6:36 AM 12/11/2023    5:37 AM  CMP  Glucose 70 - 99 mg/dL 99  161  096   BUN 8 - 23 mg/dL 10  10  10    Creatinine 0.61 - 1.24 mg/dL 0.45  4.09  8.11   Sodium 135 - 145 mmol/L 137  137  138   Potassium 3.5 - 5.1 mmol/L 3.7  4.4  4.0   Chloride 98 - 111 mmol/L 103  106  104   CO2 22 - 32 mmol/L 26  23  24    Calcium  8.9 - 10.3 mg/dL 8.7  8.5  8.9   Total Protein 6.5 - 8.1 g/dL   6.3   Total Bilirubin 0.0 - 1.2 mg/dL   1.0   Alkaline Phos 38 - 126 U/L   70   AST 15 - 41 U/L   55   ALT 0 - 44 U/L   72     Scheduled Meds:  amLODipine   2.5 mg Oral Daily   aspirin  EC  81 mg Oral Daily   clopidogrel   75 mg Oral Daily   [START ON 12/14/2023] doxycycline  100 mg Oral Q12H   enoxaparin  (LOVENOX ) injection  40 mg Subcutaneous Q24H   feeding supplement  237 mL Oral BID BM   insulin  aspart  0-9 Units Subcutaneous TID WC   montelukast   10 mg Oral QHS   multivitamin with minerals  1 tablet Oral Daily   nutrition supplement (JUVEN)  1 packet Oral BID BM   polyethylene glycol  17 g Oral Daily   tamsulosin   0.4 mg Oral Daily   Continuous Infusions:  Time 16  Jai-Gurmukh Tyronda Vizcarrondo, MD  [facility]

## 2023-12-13 NOTE — Plan of Care (Signed)
  Problem: Education: Goal: Ability to describe self-care measures that may prevent or decrease complications (Diabetes Survival Skills Education) will improve Outcome: Progressing   Problem: Education: Goal: Knowledge of General Education information will improve Description: Including pain rating scale, medication(s)/side effects and non-pharmacologic comfort measures Outcome: Progressing   Problem: Activity: Goal: Risk for activity intolerance will decrease Outcome: Progressing   Problem: Pain Managment: Goal: General experience of comfort will improve and/or be controlled Outcome: Progressing   Problem: Safety: Goal: Ability to remain free from injury will improve Outcome: Progressing   Problem: Skin Integrity: Goal: Risk for impaired skin integrity will decrease Outcome: Progressing

## 2023-12-13 NOTE — Progress Notes (Signed)
  Subjective:  Patient ID: [name], male    DOB: [birth year],  MRN: [id number]  Chief Complaint  Patient presents with   Wound Infection    DOS: 12/11/2023 Procedure: 1.  Partial fifth ray amputation with excision of ulceration, right foot 2.  Application dissolvable antibiotic beads, right foot 3.  Application dermal allograft, 38 cm, right foot  88 y.o. male seen for post op check.  Pt seen doing well this AM, seems in good spirits no concerns. Says having some pulling pain occasionally in the right foot.   Review of Systems: Negative except as noted in the HPI. Denies N/V/F/Ch.   Objective:   Constitutional Well developed. Well nourished.  Vascular Foot warm and well perfused. Capillary refill normal to all digits.   No calf pain with palpation  Neurologic Normal speech. Oriented to person, place, and time. Epicritic sensation diminished to right foot  Dermatologic Amp site viable no necrosis, dehiscence or drainage healing well   Orthopedic: Status post right partial fifth ray amputation   Radiographs: Amputation of fifth ray at the proximal aspect of the fifth metatarsal  Pathology: A. TOE, RIGHT FIFTH, AMPUTATION:  Ulceration with acute inflammation and acute osteomyelitis.   B. BONE, RIGHT FIFTH, PROXIMAL, MARGIN:  Benign bone and connective tissue.  Negative for osteomyelitis.   Micro:  Staph aureus   Assessment:   1. Wound infection   2. Osteomyelitis of left foot, unspecified type (HCC)   Status post right partial fifth ray amputation  Plan:  Patient was evaluated and treated and all questions answered.  POD # 2 s/p partial fifth ray amputation right foot -Progressing as expected postoperatively.  Amp site viable no evidence residual infection. Proximal margin negative for OM - Discussed with Dr. Haywood Lisle, likely needs SNF due to cognitive issues and NWB R foot -XR: Expected postop changes -WB Status: Nonweightbearing in soft dressing while in bed  and postop shoe while up with PT -Sutures: Remain intact. -Medications/ABX: Continue antibiotics broad-spectrum pending further culture data, likely ok for 14 days doxycyline on DC -Dressing: Remain intact until follow up next week Thursday - F/u Plan: Will see if remains admitted Monday but he is ok for DC to rehab from my standpoint. Follow up Thursday in clinic.         Maridee Shoemaker, DPM [facility] / [facility]

## 2023-12-13 NOTE — Evaluation (Signed)
 Physical Therapy Evaluation Patient Details Name: [name] MRN: [id number] DOB: 1935/12/03 Today's Date: 12/13/2023  History of Present Illness  88 yo male presents to ED on 5/6 with R foot wound. Dx of R 5th ray osteomyelitis, s/p R partial 5th ray amputation 5/7. PMHx includes: arthritis, R bundle branch block and bradycardia, HTN, PVD, CVA w/ R side weakness, preDM.  Clinical Impression  Pt presents with RLE pain, debility, impaired sitting balance dynamically, impaired activity tolerance vs baseline, inability to stand on eval secondary to weakness and pain. Pt to benefit from acute PT to address deficits. Pt requiring mod +2 safety for moving to/from EOB, once EOB pt complaining of severe pain and started to lay back down. Unable to progress to EOB scooting or transfer OOB this date. Patient will benefit from continued inpatient follow up therapy, <3 hours/day. Pt lives alone at baseline with intermittent support from his cousin [name]. PT to progress mobility as tolerated, and will continue to follow acutely.          If plan is discharge home, recommend the following: Two people to help with walking and/or transfers;A lot of help with bathing/dressing/bathroom   Can travel by private vehicle   No    Equipment Recommendations None recommended by PT  Recommendations for Other Services       Functional Status Assessment Patient has had a recent decline in their functional status and demonstrates the ability to make significant improvements in function in a reasonable and predictable amount of time.     Precautions / Restrictions Precautions Precautions: Fall Recall of Precautions/Restrictions: Impaired Precaution/Restrictions Comments: R chronic hemi, has AFO for shoe but needs post op shoe now Required Braces or Orthoses: Other Brace Other Brace: post op shoe R LE Restrictions Weight Bearing Restrictions Per Provider Order: Yes RLE Weight Bearing Per Provider Order: Non  weight bearing      Mobility  Bed Mobility Overal bed mobility: Needs Assistance Bed Mobility: Supine to Sit, Sit to Supine     Supine to sit: Mod assist, +2 for safety/equipment Sit to supine: Mod assist, +2 for safety/equipment   General bed mobility comments: R LE, trunk elevation and scooting forward. +2 to return boost up in bed    Transfers                   General transfer comment: deferred, attempted lateral scoot towards HOB but pt laying back down    Ambulation/Gait                  Stairs            Wheelchair Mobility     Tilt Bed    Modified Rankin (Stroke Patients Only)       Balance Overall balance assessment: Needs assistance Sitting-balance support: No upper extremity supported, Feet supported Sitting balance-Leahy Scale: Fair Sitting balance - Comments: limited dynamically       Standing balance comment: nt                             Pertinent Vitals/Pain Pain Assessment Pain Assessment: Faces Faces Pain Scale: Hurts even more Pain Location: R foot Pain Descriptors / Indicators: Discomfort, Operative site guarding Pain Intervention(s): Limited activity within patient's tolerance, Monitored during session, Repositioned    Home Living Family/patient expects to be discharged to:: Private residence Living Arrangements: Alone Available Help at Discharge: Family Type of Home: House Home Access: Ramped  entrance       Home Layout: One level Home Equipment: Cane - single point;Shower seat;Grab bars - tub/shower;Grab bars - toilet Additional Comments: reports cousin [name] checks on him 3x/day; reports he has a wc at home now    Prior Function Prior Level of Function : Needs assist             Mobility Comments: reports using 'walking cane" for mobility, using AFO on R foot. ADLs Comments: able to complete basic ADLs but only basin bathing, [name] assisting with IADLs     Extremity/Trunk Assessment    Upper Extremity Assessment Upper Extremity Assessment: Defer to OT evaluation RUE Deficits / Details: chronic flexion positioning from CVA, non functional RUE Coordination: decreased fine motor;decreased gross motor    Lower Extremity Assessment Lower Extremity Assessment: RLE deficits/detail RLE Deficits / Details: hemiparesis and now s/p 5th ray amp, able to perform quad set and limited ROM heel slide given foot pain RLE: Unable to fully assess due to pain    Cervical / Trunk Assessment Cervical / Trunk Assessment: Normal  Communication   Communication Communication: Impaired Factors Affecting Communication: Hearing impaired;Reduced clarity of speech    Cognition Arousal: Alert Behavior During Therapy: WFL for tasks assessed/performed   PT - Cognitive impairments: No family/caregiver present to determine baseline                       PT - Cognition Comments: very HOH, unsure of accuracy as historian and no family at bedside Following commands: Impaired Following commands impaired: Follows one step commands with increased time, Follows multi-step commands inconsistently     Cueing Cueing Techniques: Verbal cues, Visual cues, Gestural cues     General Comments      Exercises     Assessment/Plan    PT Assessment Patient needs continued PT services  PT Problem List Decreased strength;Decreased mobility;Decreased activity tolerance;Decreased balance;Decreased knowledge of use of DME;Pain;Decreased safety awareness;Decreased knowledge of precautions;Decreased skin integrity       PT Treatment Interventions DME instruction;Therapeutic activities;Gait training;Therapeutic exercise;Patient/family education;Balance training;Stair training;Functional mobility training;Neuromuscular re-education    PT Goals (Current goals can be found in the Care Plan section)  Acute Rehab PT Goals Patient Stated Goal: decrease pain PT Goal Formulation: With patient Time For  Goal Achievement: 12/27/23 Potential to Achieve Goals: Good    Frequency Min 2X/week     Co-evaluation PT/OT/SLP Co-Evaluation/Treatment: Yes Reason for Co-Treatment: For patient/therapist safety;To address functional/ADL transfers PT goals addressed during session: Mobility/safety with mobility;Balance OT goals addressed during session: ADL's and self-care       AM-PAC PT "6 Clicks" Mobility  Outcome Measure Help needed turning from your back to your side while in a flat bed without using bedrails?: A Lot Help needed moving from lying on your back to sitting on the side of a flat bed without using bedrails?: A Lot Help needed moving to and from a bed to a chair (including a wheelchair)?: A Lot Help needed standing up from a chair using your arms (e.g., wheelchair or bedside chair)?: Total Help needed to walk in hospital room?: Total Help needed climbing 3-5 steps with a railing? : Total 6 Click Score: 9    End of Session Equipment Utilized During Treatment: Other (comment) (R post-op shoe) Activity Tolerance: Patient limited by pain;Patient limited by fatigue Patient left: in bed;with call bell/phone within reach;with bed alarm set Nurse Communication: Mobility status PT Visit Diagnosis: Other abnormalities of gait and mobility (R26.89);Muscle  weakness (generalized) (M62.81)    Time: 5409-8119 PT Time Calculation (min) (ACUTE ONLY): 19 min   Charges:   PT Evaluation $PT Eval Low Complexity: 1 Low   PT General Charges $$ ACUTE PT VISIT: 1 Visit         Shirlene Doughty, PT DPT Acute Rehabilitation Services Secure Chat Preferred  Office [phone]   Kelson Queenan E Stroup 12/13/2023, 2:08 PM

## 2023-12-13 NOTE — Care Management Important Message (Signed)
 Important Message  Patient Details  Name: [name] MRN: [id number] Date of Birth: November 18, 1935   Important Message Given:  Yes - Medicare IM     Felix Host 12/13/2023, 2:23 PM

## 2023-12-13 NOTE — Evaluation (Signed)
 Occupational Therapy Evaluation Patient Details Name: [name] MRN: [id number] DOB: [birth year] Today's Date: 12/13/2023   History of Present Illness   88 yo male presents to ED on 5/6 with R foot wound. Dx of R 5th ray osteomyelitis, s/p R partial 5th ray amputation 5/7. PMHx includes: arthritis, R bundle branch block and bradycardia, HTN, PVD, CVA w/ R side weakness, preDM.     Clinical Impressions PTA patient reports using "walking cane" for mobility while wearing his AFO to R foot, managing ADLs but needing assist for IADLs.  Reports living alone with his cousin [name] checking on him 3x/day.  Admitted for above and presents with problem list below.  He requires min to mod assist for bed mobility, setup to total assist for ADLs at EOB. Attempted lateral scoots but pt initiated laying back down instead.   Based on performance today, believe patient will best benefit from continued OT services acutely and after dc at inpatient setting with <3hrs/day to optimize independence, safety with ADLs and mobility.  If pt declines continued therapy, he will need 24/7 physical assistance at dc.      If plan is discharge home, recommend the following:   Two people to help with walking and/or transfers;A lot of help with bathing/dressing/bathroom;Assistance with cooking/housework;Assist for transportation;Help with stairs or ramp for entrance;Direct supervision/assist for financial management;Direct supervision/assist for medications management     Functional Status Assessment   Patient has had a recent decline in their functional status and demonstrates the ability to make significant improvements in function in a reasonable and predictable amount of time.     Equipment Recommendations   Other (comment);Wheelchair (measurements OT);Wheelchair cushion (measurements OT) (drop arm BSC)     Recommendations for Other Services         Precautions/Restrictions   Precautions Precautions:  Fall Recall of Precautions/Restrictions: Impaired Precaution/Restrictions Comments: R chronic hemi, has AFO for shoe but needs post op shoe now Required Braces or Orthoses: Other Brace Other Brace: post op shoe R LE Restrictions Weight Bearing Restrictions Per Provider Order: Yes RLE Weight Bearing Per Provider Order: Non weight bearing     Mobility Bed Mobility Overal bed mobility: Needs Assistance Bed Mobility: Supine to Sit, Sit to Supine     Supine to sit: Mod assist Sit to supine: Mod assist   General bed mobility comments: R LE, trunk elevation and scooting forward. +2 to return boost up in bed    Transfers                   General transfer comment: deferred, attempted lateral scoot towards HOB but pt laying back down      Balance Overall balance assessment: Needs assistance Sitting-balance support: No upper extremity supported, Feet supported Sitting balance-Leahy Scale: Fair Sitting balance - Comments: limited dynamically                                   ADL either performed or assessed with clinical judgement   ADL Overall ADL's : Needs assistance/impaired     Grooming: Minimal assistance;Sitting           Upper Body Dressing : Moderate assistance;Sitting   Lower Body Dressing: Total assistance;Sitting/lateral leans;Bed level     Toilet Transfer Details (indicate cue type and reason): deferred         Functional mobility during ADLs: Minimal assistance General ADL Comments: to EOB only  Vision   Vision Assessment?: No apparent visual deficits     Perception         Praxis         Pertinent Vitals/Pain Pain Assessment Pain Assessment: Faces Faces Pain Scale: Hurts even more Pain Location: R foot Pain Descriptors / Indicators: Discomfort, Operative site guarding Pain Intervention(s): Limited activity within patient's tolerance, Monitored during session, Repositioned     Extremity/Trunk Assessment Upper  Extremity Assessment Upper Extremity Assessment: RUE deficits/detail;Generalized weakness RUE Deficits / Details: chronic flexion positioning from CVA, non functional RUE Coordination: decreased fine motor;decreased gross motor   Lower Extremity Assessment Lower Extremity Assessment: Defer to PT evaluation       Communication Communication Communication: Impaired Factors Affecting Communication: Hearing impaired;Reduced clarity of speech   Cognition Arousal: Alert Behavior During Therapy: WFL for tasks assessed/performed Cognition: No family/caregiver present to determine baseline             OT - Cognition Comments: pt oriented and following simple commands with increased time but easily distracted requires frequent redirection. unsure baseline cognition level                 Following commands: Impaired Following commands impaired: Follows one step commands with increased time, Follows multi-step commands inconsistently     Cueing  General Comments   Cueing Techniques: Verbal cues;Visual cues;Gestural cues      Exercises     Shoulder Instructions      Home Living Family/patient expects to be discharged to:: Private residence Living Arrangements: Alone Available Help at Discharge: Family Type of Home: House Home Access: Ramped entrance     Home Layout: One level     Bathroom Shower/Tub: Producer, television/film/video: Standard     Home Equipment: Cane - single point;Shower seat;Grab bars - tub/shower;Grab bars - toilet   Additional Comments: reports cousin [name] checks on him 3x/day; reports he has a wc at home now      Prior Functioning/Environment Prior Level of Function : Needs assist             Mobility Comments: reports using 'walking cane" for mobility, using AFO on R foot. ADLs Comments: able to complete basic ADLs but only basin bathing, [name] assisting with IADLs    OT Problem List: Decreased strength;Decreased activity  tolerance;Impaired balance (sitting and/or standing);Decreased cognition;Decreased safety awareness;Decreased knowledge of use of DME or AE;Decreased knowledge of precautions;Pain;Impaired UE functional use;Increased edema   OT Treatment/Interventions: Self-care/ADL training;Therapeutic exercise;DME and/or AE instruction;Therapeutic activities;Cognitive remediation/compensation;Patient/family education;Balance training      OT Goals(Current goals can be found in the care plan section)   Acute Rehab OT Goals Patient Stated Goal: feel better OT Goal Formulation: With patient Time For Goal Achievement: 12/27/23 Potential to Achieve Goals: Fair   OT Frequency:  Min 2X/week    Co-evaluation PT/OT/SLP Co-Evaluation/Treatment: Yes Reason for Co-Treatment: For patient/therapist safety;To address functional/ADL transfers   OT goals addressed during session: ADL's and self-care      AM-PAC OT "6 Clicks" Daily Activity     Outcome Measure Help from another person eating meals?: A Little Help from another person taking care of personal grooming?: A Little Help from another person toileting, which includes using toliet, bedpan, or urinal?: Total Help from another person bathing (including washing, rinsing, drying)?: A Lot Help from another person to put on and taking off regular upper body clothing?: A Lot Help from another person to put on and taking off regular lower body clothing?: Total  6 Click Score: 12   End of Session Nurse Communication: Mobility status  Activity Tolerance: Patient limited by pain Patient left: in bed;with call bell/phone within reach;with bed alarm set  OT Visit Diagnosis: Other abnormalities of gait and mobility (R26.89);Muscle weakness (generalized) (M62.81);Pain Pain - Right/Left: Right Pain - part of body: Ankle and joints of foot                Time: 1610-9604 OT Time Calculation (min): 19 min Charges:  OT General Charges $OT Visit: 1 Visit OT  Evaluation $OT Eval Moderate Complexity: 1 Mod  Bary Boss, OT Acute Rehabilitation Services Office [phone] Secure Chat Preferred    Fredrich Jefferson 12/13/2023, 1:38 PM

## 2023-12-14 DIAGNOSIS — M869 Osteomyelitis, unspecified: Secondary | ICD-10-CM | POA: Diagnosis not present

## 2023-12-14 LAB — CBC WITH DIFFERENTIAL/PLATELET
Abs Immature Granulocytes: 0.7 10*3/uL — ABNORMAL HIGH (ref 0.00–0.07)
Basophils Absolute: 0.1 10*3/uL (ref 0.0–0.1)
Basophils Relative: 1 %
Eosinophils Absolute: 1.2 10*3/uL — ABNORMAL HIGH (ref 0.0–0.5)
Eosinophils Relative: 9 %
HCT: 32.5 % — ABNORMAL LOW (ref 39.0–52.0)
Hemoglobin: 11.4 g/dL — ABNORMAL LOW (ref 13.0–17.0)
Immature Granulocytes: 5 %
Lymphocytes Relative: 10 %
Lymphs Abs: 1.3 10*3/uL (ref 0.7–4.0)
MCH: 33 pg (ref 26.0–34.0)
MCHC: 35.1 g/dL (ref 30.0–36.0)
MCV: 94.2 fL (ref 80.0–100.0)
Monocytes Absolute: 1.6 10*3/uL — ABNORMAL HIGH (ref 0.1–1.0)
Monocytes Relative: 12 %
Neutro Abs: 8.2 10*3/uL — ABNORMAL HIGH (ref 1.7–7.7)
Neutrophils Relative %: 63 %
Platelets: 412 10*3/uL — ABNORMAL HIGH (ref 150–400)
RBC: 3.45 MIL/uL — ABNORMAL LOW (ref 4.22–5.81)
RDW: 15.7 % — ABNORMAL HIGH (ref 11.5–15.5)
WBC: 13.1 10*3/uL — ABNORMAL HIGH (ref 4.0–10.5)
nRBC: 0 % (ref 0.0–0.2)

## 2023-12-14 LAB — BASIC METABOLIC PANEL WITH GFR
Anion gap: 8 (ref 5–15)
BUN: 15 mg/dL (ref 8–23)
CO2: 24 mmol/L (ref 22–32)
Calcium: 8.6 mg/dL — ABNORMAL LOW (ref 8.9–10.3)
Chloride: 103 mmol/L (ref 98–111)
Creatinine, Ser: 0.85 mg/dL (ref 0.61–1.24)
GFR, Estimated: >60 mL/min (ref >60)
Glucose, Bld: 103 mg/dL — ABNORMAL HIGH (ref 70–99)
Potassium: 4 mmol/L (ref 3.5–5.1)
Sodium: 135 mmol/L (ref 135–145)

## 2023-12-14 LAB — GLUCOSE, CAPILLARY
Glucose-Capillary: 107 mg/dL — ABNORMAL HIGH (ref 70–99)
Glucose-Capillary: 96 mg/dL (ref 70–99)
Glucose-Capillary: 162 mg/dL — ABNORMAL HIGH (ref 70–99)
Glucose-Capillary: 100 mg/dL — ABNORMAL HIGH (ref 70–99)

## 2023-12-14 MED ORDER — METHOCARBAMOL 500 MG PO TABS
500.0000 mg | ORAL_TABLET | Freq: Three times a day (TID) | ORAL | Status: DC
  Administered 2023-12-14 – 2023-12-17 (×10): 500 mg via ORAL
  Filled 2023-12-14 (×10): qty 1

## 2023-12-14 NOTE — Progress Notes (Signed)
 TRH ROUNDING NOTE GIULIANI AZAM LKG:[id number]  DOB: 1936-03-18  DOA: 12/10/2023  PCP: Susanna Epley, FNP  12/14/2023,11:10 AM  LOS: 4 days    Code Status: full   From:   home    Current Dispo: home   88 year old-at baseline uses cane for ambulation?  Motorized wheelchair HTN peripheral vascular disease CVA 2001 with right-sided weakness CABG X3 LIMA-LAD SVG-diagonal/RCA and left atrial appendage thrombus removal with ligation and application of clip Patient follows chronically with podiatry for right foot wound and was evaluated on 11/18/2023 with debridement of overlying callus and placed in the offloading shoe Patient was seen at wound care on 5/2 and started on amoxicillin and doxycycline because of oozing yellow pus and bright red blood from the area MR right foot showed open wound plantar aspect exposure fifth metatarsal head dislocation of fifth metatarsophalangeal joint and osteomyelitis fifth metatarsal head neck with septic tenosynovitis of abductor digiti minimi tendon 5/7 underwent fifth partial ray excision of ulceration right foot application of dissolvable beads and dermal allograft placement  Plan  Sepsis tenosynovitis osteomyelitis fifth metatarsal head Status post procedure as above- Wound cult show rare staph, BC x 2 neg--margins were clean per Podiatry--switched Ceftriaxone  to doyx 100 po bid--monitor trends and fever curve, WBC still up Pain mod control Tylenol  650 every 6 as needed mild pain, add Oxyir 5-10 mg every 4 as needed moderate and reserve Dilaudid  for severe pain Add some robaxin 500 tid for spasm Wound site bleeding slightly--made Podiatry aware  Chronic stroke with right-sided deficits 2001 Back on aspirin  81, Plavix  75 Probably needs rehab placement   Right hip pain Right hip x-rays show only arthritis with mild degenerative changes and no acute abnormality and he is moving it better   CABG X3 2023 Crestor  as outpatient continue Norvasc  2.5 Antiplatelets  as above   Paroxysmal A-fib?  CHADVASC >4 Not on any anticoagulation?  No rate control-monitor trends   Prediabetes-A1c 6.0 Not on meds-outpatient follow-up-sliding scale show sugars predominantly below 120 therefore discontinue coverage  DVT prophylaxis: Resume Lovenox  a.m. 5/8  Status is: Inpatient Remains inpatient appropriate because:   Requires therapy eval safety assessment etc. Called to Update Raenette Bumps, cousin on phone  [phone]--no asnwer  Needs skilled  Subjective:  Complains of spasm, States the wound started bleeing overnight No fever, n,v chills   Objective + exam Vitals:   12/13/23 1343 12/13/23 1951 12/14/23 0357 12/14/23 0828  BP: (!) 141/76 (!) 140/79 130/61 130/64  Pulse: 80 69 62 63  Resp: 16 17 16 16   Temp: 98.4 F (36.9 C) 98.3 F (36.8 C) 98.6 F (37 C) 98 F (36.7 C)  TempSrc: Oral Oral Oral Oral  SpO2: 97% 95% 96% 98%  Weight:      Height:       Filed Weights   12/10/23 0741 12/11/23 0913  Weight: 80 kg 80 kg    Examination:  EOMI NCAT no focal deficit no icterus no pallor no wheeze rales rhonchi Chest is clear no added sound heart- on telemetry PVC afib Abd soft nt nd no rebound Reinforced bandage R side, some dried blood around toes  Data Reviewed: reviewed   CBC    Component Value Date/Time   WBC 13.1 (H) 12/14/2023 0510   RBC 3.45 (L) 12/14/2023 0510   HGB 11.4 (L) 12/14/2023 0510   HGB 14.0 08/28/2023 1524   HCT 32.5 (L) 12/14/2023 0510   HCT 40.7 08/28/2023 1524   PLT 412 (H) 12/14/2023 0510  PLT 278 08/28/2023 1524   MCV 94.2 12/14/2023 0510   MCV 97 08/28/2023 1524   MCH 33.0 12/14/2023 0510   MCHC 35.1 12/14/2023 0510   RDW 15.7 (H) 12/14/2023 0510   RDW 16.2 (H) 08/28/2023 1524   LYMPHSABS 1.3 12/14/2023 0510   LYMPHSABS 1.7 08/28/2023 1524   MONOABS 1.6 (H) 12/14/2023 0510   EOSABS 1.2 (H) 12/14/2023 0510   EOSABS 0.1 08/28/2023 1524   BASOSABS 0.1 12/14/2023 0510   BASOSABS 0.0 08/28/2023 1524       Latest Ref Rng & Units 12/14/2023    5:10 AM 12/13/2023    5:59 AM 12/12/2023    6:36 AM  CMP  Glucose 70 - 99 mg/dL 960  99  454   BUN 8 - 23 mg/dL 15  10  10    Creatinine 0.61 - 1.24 mg/dL 0.98  1.19  1.47   Sodium 135 - 145 mmol/L 135  137  137   Potassium 3.5 - 5.1 mmol/L 4.0  3.7  4.4   Chloride 98 - 111 mmol/L 103  103  106   CO2 22 - 32 mmol/L 24  26  23    Calcium  8.9 - 10.3 mg/dL 8.6  8.7  8.5     Scheduled Meds:  amLODipine   2.5 mg Oral Daily   aspirin  EC  81 mg Oral Daily   clopidogrel   75 mg Oral Daily   doxycycline  100 mg Oral Q12H   enoxaparin  (LOVENOX ) injection  40 mg Subcutaneous Q24H   feeding supplement  237 mL Oral BID BM   methocarbamol  500 mg Oral TID   montelukast   10 mg Oral QHS   multivitamin with minerals  1 tablet Oral Daily   nutrition supplement (JUVEN)  1 packet Oral BID BM   polyethylene glycol  17 g Oral Daily   tamsulosin   0.4 mg Oral Daily   Continuous Infusions:  Time 15  Jai-Gurmukh Aydeen Blume, MD  [facility]

## 2023-12-14 NOTE — TOC Progression Note (Signed)
 Transition of Care [facility]) - Progression Note    Patient Details  Name: [name] MRN: [id number] Date of Birth: 17-Jan-1936  Transition of Care [facility]) CM/SW Contact  Amaryllis Junior, [place] Phone Number: 12/14/2023, 11:55 AM  Clinical Narrative:    Pt recommended for SNF. CSW met with pt at bedside and pt cousin Sammie Crigler via phone. Pt and family are in agreement to recommendation. PASRR obtained [id number] and FL2 completed. SNF referral faxed out and awaiting bed offers. TOC to follow up to review bed offers get bed choice.         Expected Discharge Plan and Services                                               Social Determinants of Health (SDOH) Interventions SDOH Screenings   Food Insecurity: No Food Insecurity (12/10/2023)  Housing: Unknown (12/10/2023)  Transportation Needs: No Transportation Needs (12/10/2023)  Utilities: Not At Risk (12/10/2023)  Alcohol Screen: Low Risk  (05/29/2023)  Depression (PHQ2-9): Low Risk  (08/28/2023)  Financial Resource Strain: Low Risk  (05/29/2023)  Physical Activity: Insufficiently Active (05/29/2023)  Social Connections: Socially Isolated (12/10/2023)  Stress: No Stress Concern Present (05/29/2023)  Tobacco Use: Low Risk  (12/11/2023)  Health Literacy: Inadequate Health Literacy (05/29/2023)    Readmission Risk Interventions     No data to display

## 2023-12-14 NOTE — NC FL2 (Signed)
   MEDICAID FL2 LEVEL OF CARE FORM     IDENTIFICATION  Patient Name: [name] Birthdate: Nov 20, 1935 Sex: male Admission Date (Current Location): 12/10/2023  [facility] and [place] Number:  Producer, television/film/video and Address:  [facility], [address] [place], [place] [address]      Provider Number: [id number]  Attending Physician Name and Address:  Verlie Glisson, MD  Relative Name and Phone Number:  Lorrene Rosser [facility])  [phone]    Current Level of Care: Hospital Recommended Level of Care: Skilled Nursing Facility Prior Approval Number:    Date Approved/Denied:   PASRR Number: [id number]  Discharge Plan: SNF    Current Diagnoses: Patient Active Problem List   Diagnosis Date Noted   Pyogenic inflammation of bone (HCC) 12/10/2023   Mild protein malnutrition (HCC) 12/10/2023   Transaminitis 12/10/2023   Thrombocytosis 12/10/2023   Normocytic anemia 12/10/2023   Acute osteomyelitis of phalanx of right foot (HCC) 12/10/2023   Encounter for annual health examination 09/05/2023   Encounter for herpes zoster vaccination 09/05/2023   Bruise 05/29/2023   Immunization due 05/29/2023   Need for COVID-19 vaccine 05/29/2023   Fall 05/21/2023   Need for influenza vaccination 05/21/2023   Bilateral sensorineural hearing loss 02/15/2022   Right spastic hemiplegia (HCC) 11/06/2021   Pulmonary hypertension, unspecified (HCC) 11/06/2021   Tear of left supraspinatus tendon 04/25/2021   Tear of left infraspinatus tendon 04/25/2021   Atherosclerotic heart disease of native coronary artery without angina pectoris 08/01/2020   Cerebrovascular accident (HCC) 08/01/2020   Constipation 08/01/2020   Mixed hyperlipidemia 08/01/2020   History of colonic polyps 08/01/2020   Protein-calorie malnutrition, severe 12/02/2019   Paroxysmal atrial fibrillation (HCC)    Hx of CABG 11/20/2019   Pressure injury of skin 11/18/2019   NSTEMI (non-ST  elevated myocardial infarction) (HCC) 11/17/2019   Elevated troponin 11/03/2019   PVD (peripheral vascular disease) (HCC) 11/03/2019   History of CVA with residual deficit 11/03/2019   Elevated CK 11/03/2019   Fall at home, initial encounter 11/03/2019   Prediabetes 11/03/2019   Essential hypertension 11/03/2019   PAD (peripheral artery disease) (HCC) 05/11/2019    Orientation RESPIRATION BLADDER Height & Weight     Self, Situation, Place  Normal Continent Weight: 176 lb 5.9 oz (80 kg) Height:  5\' 9"  (175.3 cm)  BEHAVIORAL SYMPTOMS/MOOD NEUROLOGICAL BOWEL NUTRITION STATUS      Continent Diet (heart healthy/carb)  AMBULATORY STATUS COMMUNICATION OF NEEDS Skin   Limited Assist Verbally Surgical wounds                       Personal Care Assistance Level of Assistance  Bathing, Feeding, Dressing Bathing Assistance: Limited assistance Feeding assistance: Limited assistance Dressing Assistance: Limited assistance     Functional Limitations Info  Sight, Hearing, Speech Sight Info: Impaired Hearing Info: Impaired Speech Info: Impaired    SPECIAL CARE FACTORS FREQUENCY  PT (By licensed PT), OT (By licensed OT)     PT Frequency: 5x/wk OT Frequency: 5x/wk            Contractures Contractures Info: Not present    Additional Factors Info  Code Status, Allergies Code Status Info: Full Code Allergies Info: No Known Allergies           Current Medications (12/14/2023):  This is the current hospital active medication list Current Facility-Administered Medications  Medication Dose Route Frequency Provider Last Rate Last Admin   acetaminophen  (TYLENOL ) tablet 650  mg  650 mg Oral Q6H PRN Danice Dural, MD   650 mg at 12/12/23 2121   Or   acetaminophen  (TYLENOL ) suppository 650 mg  650 mg Rectal Q6H PRN Danice Dural, MD       amLODipine  (NORVASC ) tablet 2.5 mg  2.5 mg Oral Daily Danice Dural, MD   2.5 mg at 12/14/23 1610   aspirin  EC tablet 81 mg   81 mg Oral Daily Samtani, Jai-Gurmukh, MD   81 mg at 12/14/23 9604   clopidogrel  (PLAVIX ) tablet 75 mg  75 mg Oral Daily Samtani, Jai-Gurmukh, MD   75 mg at 12/14/23 0934   doxycycline (VIBRA-TABS) tablet 100 mg  100 mg Oral Q12H Samtani, Jai-Gurmukh, MD   100 mg at 12/14/23 0934   enoxaparin  (LOVENOX ) injection 40 mg  40 mg Subcutaneous Q24H Samtani, Jai-Gurmukh, MD   40 mg at 12/14/23 1123   feeding supplement (ENSURE ENLIVE / ENSURE PLUS) liquid 237 mL  237 mL Oral BID BM Samtani, Jai-Gurmukh, MD   237 mL at 12/14/23 1125   HYDROmorphone  (DILAUDID ) injection 0.5 mg  0.5 mg Intravenous Q6H PRN Amponsah, Prosper M, MD   0.5 mg at 12/10/23 2116   methocarbamol (ROBAXIN) tablet 500 mg  500 mg Oral TID Samtani, Jai-Gurmukh, MD   500 mg at 12/14/23 1123   montelukast  (SINGULAIR ) tablet 10 mg  10 mg Oral QHS Danice Dural, MD   10 mg at 12/13/23 2149   multivitamin with minerals tablet 1 tablet  1 tablet Oral Daily Samtani, Jai-Gurmukh, MD   1 tablet at 12/14/23 1123   nutrition supplement (JUVEN) (JUVEN) powder packet 1 packet  1 packet Oral BID BM Samtani, Jai-Gurmukh, MD   1 packet at 12/14/23 0933   ondansetron  (ZOFRAN ) tablet 4 mg  4 mg Oral Q6H PRN Danice Dural, MD       Or   ondansetron  (ZOFRAN ) injection 4 mg  4 mg Intravenous Q6H PRN Danice Dural, MD       oxyCODONE  (Oxy IR/ROXICODONE ) immediate release tablet 5-10 mg  5-10 mg Oral Q4H PRN Samtani, Jai-Gurmukh, MD   10 mg at 12/14/23 0632   polyethylene glycol (MIRALAX  / GLYCOLAX ) packet 17 g  17 g Oral Daily Danice Dural, MD   17 g at 12/14/23 5409   tamsulosin  (FLOMAX ) capsule 0.4 mg  0.4 mg Oral Daily Danice Dural, MD   0.4 mg at 12/14/23 8119     Discharge Medications: Please see discharge summary for a list of discharge medications.  Relevant Imaging Results:  Relevant Lab Results:   Additional Information SSN [id number]  Amaryllis Junior, LCSW

## 2023-12-14 NOTE — Progress Notes (Addendum)
   Contacted by hospitalist for bleeding through the bandage this morning.  Foot evaluated.  There is some very minimal bleeding to a very small focal portion of the incision site.  Clean sterile compressive dressing applied to lower extremity.  Leave clean dry and intact.  Dot Gazella, DPM [facility]  Dr. Dot Gazella, DPM    [address] [place], [place] [address]                Office [phone]  Fax [phone]

## 2023-12-15 DIAGNOSIS — M869 Osteomyelitis, unspecified: Secondary | ICD-10-CM | POA: Diagnosis not present

## 2023-12-15 LAB — CBC WITH DIFFERENTIAL/PLATELET
Abs Immature Granulocytes: 0 10*3/uL (ref 0.00–0.07)
Basophils Absolute: 0.3 10*3/uL — ABNORMAL HIGH (ref 0.0–0.1)
Basophils Relative: 2 %
Eosinophils Absolute: 1.1 10*3/uL — ABNORMAL HIGH (ref 0.0–0.5)
Eosinophils Relative: 8 %
HCT: 31.6 % — ABNORMAL LOW (ref 39.0–52.0)
Hemoglobin: 10.9 g/dL — ABNORMAL LOW (ref 13.0–17.0)
Lymphocytes Relative: 7 %
Lymphs Abs: 1 10*3/uL (ref 0.7–4.0)
MCH: 32.2 pg (ref 26.0–34.0)
MCHC: 34.5 g/dL (ref 30.0–36.0)
MCV: 93.2 fL (ref 80.0–100.0)
Monocytes Absolute: 0.8 10*3/uL (ref 0.1–1.0)
Monocytes Relative: 6 %
Neutro Abs: 10.5 10*3/uL — ABNORMAL HIGH (ref 1.7–7.7)
Neutrophils Relative %: 77 %
Platelets: 392 10*3/uL (ref 150–400)
RBC: 3.39 MIL/uL — ABNORMAL LOW (ref 4.22–5.81)
RDW: 15.6 % — ABNORMAL HIGH (ref 11.5–15.5)
WBC: 13.7 10*3/uL — ABNORMAL HIGH (ref 4.0–10.5)
nRBC: 0 % (ref 0.0–0.2)
nRBC: 0 /100{WBCs}

## 2023-12-15 LAB — BASIC METABOLIC PANEL WITH GFR
Anion gap: 7 (ref 5–15)
BUN: 20 mg/dL (ref 8–23)
CO2: 26 mmol/L (ref 22–32)
Calcium: 8.9 mg/dL (ref 8.9–10.3)
Chloride: 102 mmol/L (ref 98–111)
Creatinine, Ser: 0.86 mg/dL (ref 0.61–1.24)
GFR, Estimated: >60 mL/min (ref >60)
Glucose, Bld: 107 mg/dL — ABNORMAL HIGH (ref 70–99)
Potassium: 4.5 mmol/L (ref 3.5–5.1)
Sodium: 135 mmol/L (ref 135–145)

## 2023-12-15 LAB — CULTURE, BLOOD (ROUTINE X 2)
Culture: NO GROWTH
Culture: NO GROWTH
Special Requests: ADEQUATE
Special Requests: ADEQUATE

## 2023-12-15 LAB — GLUCOSE, CAPILLARY
Glucose-Capillary: 101 mg/dL — ABNORMAL HIGH (ref 70–99)
Glucose-Capillary: 87 mg/dL (ref 70–99)
Glucose-Capillary: 140 mg/dL — ABNORMAL HIGH (ref 70–99)
Glucose-Capillary: 102 mg/dL — ABNORMAL HIGH (ref 70–99)

## 2023-12-15 NOTE — Progress Notes (Signed)
 TRH ROUNDING NOTE [name] VHQ:[id number]  DOB: 1936/04/22  DOA: 12/10/2023  PCP: Susanna Epley, FNP  12/15/2023,10:49 AM  LOS: 5 days    Code Status: full   From:   home    Current Dispo: home   88 year old-at baseline uses cane for ambulation?  Motorized wheelchair HTN peripheral vascular disease CVA 2001 with right-sided weakness CABG X3 LIMA-LAD SVG-diagonal/RCA and left atrial appendage thrombus removal with ligation and application of clip Patient follows chronically with podiatry for right foot wound and was evaluated on 11/18/2023 with debridement of overlying callus and placed in the offloading shoe Patient was seen at wound care on 5/2 and started on amoxicillin and doxycycline because of oozing yellow pus and bright red blood from the area MR right foot showed open wound plantar aspect exposure fifth metatarsal head dislocation of fifth metatarsophalangeal joint and osteomyelitis fifth metatarsal head neck with septic tenosynovitis of abductor digiti minimi tendon 5/7 underwent fifth partial ray excision of ulceration right foot application of dissolvable beads and dermal allograft placement  Plan  Sepsis tenosynovitis osteomyelitis fifth metatarsal head--Status post procedure as above- Rare Staphylococcus wound culture 5/7-blood cultures -5/6-wound margins clean-Rocephin  changed to doxycycline would give treat until 12/24/2023 Pain mod control Tylenol  650 every 6 as needed mild pain, add Oxyir 5-10 mg every 4 as needed moderate and reserve Dilaudid  for severe pain Add some robaxin 500 tid for spasm although he still has some Bleeding from wound site is a little bit better  Chronic stroke with right-sided deficits 2001 Back on aspirin  81, Plavix  75 Awaiting rehab placement   Right hip pain Right hip x-rays show only arthritis with mild degenerative changes and no acute abnormality and he is moving it better   CABG X3 2023 Crestor  as outpatient continue Norvasc  2.5 Continues on  aspirin  81 daily, Plavix  75 daily   Paroxysmal A-fib?  CHADVASC >4 Not on any anticoagulation?  No rate control-monitor trends   Prediabetes-A1c 6.0 Not on meds-outpatient follow-up-sliding scale show sugars predominantly below 120 therefore discontinue coverage  DVT prophylaxis: Resume Lovenox  a.m. 5/8  Status is: Inpatient Remains inpatient appropriate because:   Requires therapy eval safety assessment etc. Called to Update Raenette Bumps, cousin on phone  [phone]--no asnwer  Needs skilled  Subjective:  Spasms still about the same occasional discomfort No fever no chills Eating and drinking  Objective + exam Vitals:   12/14/23 1616 12/14/23 1941 12/15/23 0511 12/15/23 0907  BP: 114/73 (!) 146/57 127/63 114/65  Pulse: 78 73 64   Resp: 16 18 18    Temp: 97.6 F (36.4 C) 97.9 F (36.6 C) 97.7 F (36.5 C) (!) 97.4 F (36.3 C)  TempSrc: Oral Oral Oral Oral  SpO2: 100%  100% 100%  Weight:      Height:       Filed Weights   12/10/23 0741 12/11/23 0913  Weight: 80 kg 80 kg    Examination:  Coherent awake alert no distress Chest is clear S1-S2 no murmur no rub no gallop Abdominal soft no rebound Right foot is bandaged with reinforce dressing do not appreciate any bleeding  Data Reviewed: reviewed   CBC    Component Value Date/Time   WBC 13.7 (H) 12/15/2023 0640   RBC 3.39 (L) 12/15/2023 0640   HGB 10.9 (L) 12/15/2023 0640   HGB 14.0 08/28/2023 1524   HCT 31.6 (L) 12/15/2023 0640   HCT 40.7 08/28/2023 1524   PLT 392 12/15/2023 0640   PLT 278 08/28/2023 1524   MCV  93.2 12/15/2023 0640   MCV 97 08/28/2023 1524   MCH 32.2 12/15/2023 0640   MCHC 34.5 12/15/2023 0640   RDW 15.6 (H) 12/15/2023 0640   RDW 16.2 (H) 08/28/2023 1524   LYMPHSABS 1.0 12/15/2023 0640   LYMPHSABS 1.7 08/28/2023 1524   MONOABS 0.8 12/15/2023 0640   EOSABS 1.1 (H) 12/15/2023 0640   EOSABS 0.1 08/28/2023 1524   BASOSABS 0.3 (H) 12/15/2023 0640   BASOSABS 0.0 08/28/2023 1524       Latest Ref Rng & Units 12/15/2023    6:40 AM 12/14/2023    5:10 AM 12/13/2023    5:59 AM  CMP  Glucose 70 - 99 mg/dL 161  096  99   BUN 8 - 23 mg/dL 20  15  10    Creatinine 0.61 - 1.24 mg/dL 0.45  4.09  8.11   Sodium 135 - 145 mmol/L 135  135  137   Potassium 3.5 - 5.1 mmol/L 4.5  4.0  3.7   Chloride 98 - 111 mmol/L 102  103  103   CO2 22 - 32 mmol/L 26  24  26    Calcium  8.9 - 10.3 mg/dL 8.9  8.6  8.7     Scheduled Meds:  amLODipine   2.5 mg Oral Daily   aspirin  EC  81 mg Oral Daily   clopidogrel   75 mg Oral Daily   doxycycline  100 mg Oral Q12H   enoxaparin  (LOVENOX ) injection  40 mg Subcutaneous Q24H   feeding supplement  237 mL Oral BID BM   methocarbamol  500 mg Oral TID   montelukast   10 mg Oral QHS   multivitamin with minerals  1 tablet Oral Daily   nutrition supplement (JUVEN)  1 packet Oral BID BM   polyethylene glycol  17 g Oral Daily   tamsulosin   0.4 mg Oral Daily   Continuous Infusions:  Time 25  Jai-Gurmukh Latavion Halls, MD  [facility]

## 2023-12-15 NOTE — Plan of Care (Signed)
   Problem: Fluid Volume: Goal: Ability to maintain a balanced intake and output will improve Outcome: Progressing   Problem: Metabolic: Goal: Ability to maintain appropriate glucose levels will improve Outcome: Progressing   Problem: Skin Integrity: Goal: Risk for impaired skin integrity will decrease Outcome: Progressing

## 2023-12-15 NOTE — TOC Progression Note (Signed)
 Transition of Care [facility]) - Progression Note    Patient Details  Name: [name] MRN: [id number] Date of Birth: 1936/06/16  Transition of Care [facility]) CM/SW Contact  Adline Hook, [place] Phone Number: 12/15/2023, 3:19 PM  Clinical Narrative:     CSW met with patient at bedside. CSW gave bed offers. Patient stated he will have [name], number in chart) review when he comes by.        Expected Discharge Plan and Services                                               Social Determinants of Health (SDOH) Interventions SDOH Screenings   Food Insecurity: No Food Insecurity (12/10/2023)  Housing: Unknown (12/10/2023)  Transportation Needs: No Transportation Needs (12/10/2023)  Utilities: Not At Risk (12/10/2023)  Alcohol Screen: Low Risk  (05/29/2023)  Depression (PHQ2-9): Low Risk  (08/28/2023)  Financial Resource Strain: Low Risk  (05/29/2023)  Physical Activity: Insufficiently Active (05/29/2023)  Social Connections: Socially Isolated (12/10/2023)  Stress: No Stress Concern Present (05/29/2023)  Tobacco Use: Low Risk  (12/11/2023)  Health Literacy: Inadequate Health Literacy (05/29/2023)    Readmission Risk Interventions     No data to display

## 2023-12-16 DIAGNOSIS — M869 Osteomyelitis, unspecified: Secondary | ICD-10-CM | POA: Diagnosis not present

## 2023-12-16 LAB — CBC WITH DIFFERENTIAL/PLATELET
Abs Immature Granulocytes: 0.6 10*3/uL — ABNORMAL HIGH (ref 0.00–0.07)
Basophils Absolute: 0.1 10*3/uL (ref 0.0–0.1)
Basophils Relative: 1 %
Eosinophils Absolute: 0.6 10*3/uL — ABNORMAL HIGH (ref 0.0–0.5)
Eosinophils Relative: 4 %
HCT: 31.8 % — ABNORMAL LOW (ref 39.0–52.0)
Hemoglobin: 11.1 g/dL — ABNORMAL LOW (ref 13.0–17.0)
Lymphocytes Relative: 9 %
Lymphs Abs: 1.3 10*3/uL (ref 0.7–4.0)
MCH: 32.7 pg (ref 26.0–34.0)
MCHC: 34.9 g/dL (ref 30.0–36.0)
MCV: 93.8 fL (ref 80.0–100.0)
Monocytes Absolute: 0.9 10*3/uL (ref 0.1–1.0)
Monocytes Relative: 6 %
Myelocytes: 4 %
Neutro Abs: 11.2 10*3/uL — ABNORMAL HIGH (ref 1.7–7.7)
Neutrophils Relative %: 76 %
Platelets: 405 10*3/uL — ABNORMAL HIGH (ref 150–400)
RBC: 3.39 MIL/uL — ABNORMAL LOW (ref 4.22–5.81)
RDW: 15.6 % — ABNORMAL HIGH (ref 11.5–15.5)
WBC: 14.8 10*3/uL — ABNORMAL HIGH (ref 4.0–10.5)
nRBC: 0 /100{WBCs}
nRBC: 0.1 % (ref 0.0–0.2)

## 2023-12-16 LAB — BASIC METABOLIC PANEL WITH GFR
Anion gap: 9 (ref 5–15)
BUN: 20 mg/dL (ref 8–23)
CO2: 25 mmol/L (ref 22–32)
Calcium: 8.8 mg/dL — ABNORMAL LOW (ref 8.9–10.3)
Chloride: 101 mmol/L (ref 98–111)
Creatinine, Ser: 0.89 mg/dL (ref 0.61–1.24)
GFR, Estimated: >60 mL/min (ref >60)
Glucose, Bld: 108 mg/dL — ABNORMAL HIGH (ref 70–99)
Potassium: 4.3 mmol/L (ref 3.5–5.1)
Sodium: 135 mmol/L (ref 135–145)

## 2023-12-16 LAB — AEROBIC/ANAEROBIC CULTURE W GRAM STAIN (SURGICAL/DEEP WOUND)

## 2023-12-16 LAB — GLUCOSE, CAPILLARY
Glucose-Capillary: 115 mg/dL — ABNORMAL HIGH (ref 70–99)
Glucose-Capillary: 104 mg/dL — ABNORMAL HIGH (ref 70–99)

## 2023-12-16 MED ORDER — DOXYCYCLINE HYCLATE 100 MG PO TABS: 100.0000 mg | ORAL_TABLET | Freq: Two times a day (BID) | ORAL | Status: AC

## 2023-12-16 MED ORDER — OXYCODONE HCL 5 MG PO TABS
5.0000 mg | ORAL_TABLET | ORAL | 0 refills | Status: DC | PRN
Start: 2023-12-16 — End: 2024-03-19

## 2023-12-16 MED ORDER — METHOCARBAMOL 500 MG PO TABS: 500.0000 mg | ORAL_TABLET | Freq: Three times a day (TID) | ORAL | Status: AC

## 2023-12-16 NOTE — Plan of Care (Signed)
   Problem: Education: Goal: Knowledge of General Education information will improve Description: Including pain rating scale, medication(s)/side effects and non-pharmacologic comfort measures Outcome: Progressing   Problem: Activity: Goal: Risk for activity intolerance will decrease Outcome: Progressing

## 2023-12-16 NOTE — TOC Progression Note (Addendum)
 Transition of Care [facility]) - Progression Note    Patient Details  Name: NIKE NEWHART MRN: [id number] Date of Birth: [birth year]  Transition [facility] Care [facility]) CM/SW Contact  Ernst Heap Phone Number: [phone]  12/16/2023, 9:58 AM  Clinical Narrative:   9:53 AM- CSW called the patients cousin, Sammie Crigler who the patient to discuss bed offers with. CSW reviewed accepted bed offers list and Sammie Crigler stated that he did not feel comfortable making a decision unless he speaks with Caryl Clas who is more aware of SNFs. Sammie Crigler will call CSW back after the family makes a decision.   9:57 AM- CSW called Cathy and left a VM asking for patients niece to call back and discuss bed offers list.   10:01 AM- CSW received a call back from patients Cousin, William who stated that the family decided on [facility] CSW explained the insurance auth process. Gave permission to start auth.   10:06 AM- CSW called and spoke with Deanna in admissions who confirmed that they have bed availability.   Insurance [employer] #[id number]; status-pending  Patient will need dressing changes at dc.   TOC will continue following.          Expected Discharge Plan and Services         Expected Discharge Date: 12/16/23                                     Social Determinants of Health (SDOH) Interventions SDOH Screenings   Food Insecurity: No Food Insecurity (12/10/2023)  Housing: Unknown (12/10/2023)  Transportation Needs: No Transportation Needs (12/10/2023)  Utilities: Not At Risk (12/10/2023)  Alcohol Screen: Low Risk  (05/29/2023)  Depression (PHQ2-9): Low Risk  (08/28/2023)  Financial Resource Strain: Low Risk  (05/29/2023)  Physical Activity: Insufficiently Active (05/29/2023)  Social Connections: Socially Isolated (12/10/2023)  Stress: No Stress Concern Present (05/29/2023)  Tobacco Use: Low Risk  (12/11/2023)  Health Literacy: Inadequate Health Literacy (05/29/2023)    Readmission Risk  Interventions     No data to display

## 2023-12-16 NOTE — TOC Progression Note (Signed)
 Transition of Care [facility]) - Progression Note    Patient Details  Name: [name] MRN: [id number] Date of Birth: Oct 12, 1935  Transition of Care [facility]) CM/SW Contact  Ernst Heap Phone Number: [phone] 12/16/2023, 3:39 PM  Clinical Narrative:   CSW received a call from patients insurance offering a P2P. The number to call is [phone], ext. [phone] before 5/13 at 11 AM.   CSW notified attending via secure chat.   TOC will continue following.          Expected Discharge Plan and Services         Expected Discharge Date: 12/16/23                                     Social Determinants of Health (SDOH) Interventions SDOH Screenings   Food Insecurity: No Food Insecurity (12/10/2023)  Housing: Unknown (12/10/2023)  Transportation Needs: No Transportation Needs (12/10/2023)  Utilities: Not At Risk (12/10/2023)  Alcohol Screen: Low Risk  (05/29/2023)  Depression (PHQ2-9): Low Risk  (08/28/2023)  Financial Resource Strain: Low Risk  (05/29/2023)  Physical Activity: Insufficiently Active (05/29/2023)  Social Connections: Socially Isolated (12/10/2023)  Stress: No Stress Concern Present (05/29/2023)  Tobacco Use: Low Risk  (12/11/2023)  Health Literacy: Inadequate Health Literacy (05/29/2023)    Readmission Risk Interventions     No data to display

## 2023-12-16 NOTE — Progress Notes (Signed)
 Mobility Specialist Progress Note:    12/16/23 1405  Therapy Vitals  Temp 98.4 F (36.9 C)  Pulse Rate 83  Resp 16  BP 129/72  Patient Position (if appropriate) Sitting  Oxygen Therapy  SpO2 99 %  Mobility  Activity Transferred to/from [facility] (Chair>Toilet>Chair)  Level of Assistance +2 (takes two people)  Assistive Device Stedy  RLE [employer] Bearing Per Provider Order NWB  Activity Response Tolerated well  Mobility visit 1 Mobility  Mobility Specialist Start Time (ACUTE ONLY) 1324  Mobility Specialist Stop Time (ACUTE ONLY) 1341  Mobility Specialist Time Calculation (min) (ACUTE ONLY) 17 min   Pt received in chair, needing to get to BR per RN. Required minA+2 to stand in stedy. BM unsuccessful. No complaints throughout. Pt left in chair w/ RN present.  D'Vante Nolon Baxter Mobility Specialist Please contact via Special educational needs teacher or Rehab office at [phone]

## 2023-12-16 NOTE — Discharge Summary (Signed)
 Physician Discharge Summary  [name]:[id number] DOB: 22-Oct-1935 DOA: 12/10/2023  PCP: Susanna Epley, FNP  Admit date: 12/10/2023 Discharge date: 12/16/2023  Time spent: 24 minutes  Recommendations for Outpatient Follow-up:  Requires close follow-up with Dr. Rosemarie Conquest of podiatry-CC him on this note Complete 14 days more of doxycycline as per podiatry regarding lower extremity osteomyelitis Get Chem-7 CBC in about 1 week at facility can be nonweightbearing to right foot with postop shoe Check A1c in 2 to 3 months  Discharge Diagnoses:  MAIN problem for hospitalization   Tenosynovitis and osteomyelitis fifth metatarsal head status post fifth ray partial excision right foot  Please see below for itemized issues addressed in HOpsital- refer to other progress notes for clarity if needed  Discharge Condition: Improved  Diet recommendation: Diabetic  Filed Weights   12/10/23 0741 12/11/23 0913  Weight: 80 kg 80 kg    History of present illness:  88 year old-at baseline uses cane for ambulation?  Motorized wheelchair HTN peripheral vascular disease CVA 2001 with right-sided weakness CABG X3 LIMA-LAD SVG-diagonal/RCA and left atrial appendage thrombus removal with ligation and application of clip Patient follows chronically with podiatry for right foot wound and was evaluated on 11/18/2023 with debridement of overlying callus and placed in the offloading shoe Patient was seen at wound care on 5/2 and started on amoxicillin and doxycycline because of oozing yellow pus and bright red blood from the area MR right foot showed open wound plantar aspect exposure fifth metatarsal head dislocation of fifth metatarsophalangeal joint and osteomyelitis fifth metatarsal head neck with septic tenosynovitis of abductor digiti minimi tendon 5/7 underwent fifth partial ray excision of ulceration right foot application of dissolvable beads and dermal allograft placement   Plan   Sepsis  tenosynovitis osteomyelitis fifth metatarsal head--Status post procedure as above- Rare Staphylococcus wound culture 5/7-blood cultures -5/6-wound margins clean-Rocephin  changed to doxycycline-podiatry recommending 14 days extra on discharge so I will prescribe the same Pain mod control Tylenol  650 every 6 as needed mild pain, add Oxyir 5-10 mg every 4 as needed moderate pain and prescription was given for the same and will use also some robaxin 500 tid for spasm He had some bleeding from wound site which was expected and this was redressed over the weekend by Dr. Luster Salters is a little bit better   Chronic stroke with right-sided deficits 2001 Back on aspirin  81, Plavix  75 Awaiting rehab placement as he is unable to navigate move around with his chronic right-sided debility   Right hip pain Right hip x-rays show only arthritis with mild degenerative changes and no acute abnormality and he is moving it better   CABG X3 2023 Crestor  as outpatient continue Norvasc  2.5 Continues on aspirin  81 daily, Plavix  75 daily   Paroxysmal A-fib?  CHADVASC >4 Not on any anticoagulation?  No rate control-monitor trends   Prediabetes-A1c 6.0 Not on meds-outpatient follow-up-sliding scale show sugars predominantly below 120 therefore discontinue coverage Get an A1c in the outpatient setting  Discharge Exam: Vitals:   12/16/23 0447 12/16/23 0833  BP: 128/71 123/65  Pulse: 65 69  Resp: 16 17  Temp: 98 F (36.7 C)   SpO2: 95% 94%    Subj on day of d/c   Looks well  No distress   General Exam on discharge  Eomi ncat Chest clear  Abdomen soft nmt nd no reboudn no guard R LE in re-inforced dressing  Discharge Instructions   Discharge Instructions     Diet - low sodium heart healthy  Complete by: As directed    Increase activity slowly   Complete by: As directed       Allergies as of 12/16/2023   No Known Allergies      Medication List     STOP taking these medications     amoxicillin-clavulanate 875-125 MG tablet Commonly known as: AUGMENTIN   cetirizine 10 MG tablet Commonly known as: ZYRTEC       TAKE these medications    acetaminophen  325 MG tablet Commonly known as: TYLENOL  Take 2 tablets (650 mg total) by mouth every 4 (four) hours as needed for headache or mild pain.   amLODipine  2.5 MG tablet Commonly known as: NORVASC  TAKE 1 TABLET(2.5 MG) BY MOUTH DAILY What changed: See the new instructions.   aspirin  EC 81 MG tablet Take 81 mg by mouth daily.   clopidogrel  75 MG tablet Commonly known as: PLAVIX  Take 1 tablet (75 mg total) by mouth daily.   doxycycline 100 MG tablet Commonly known as: VIBRA-TABS Take 1 tablet (100 mg total) by mouth every 12 (twelve) hours for 14 days. What changed: when to take this   ELDERBERRY IMMUNE COMPLEX PO Take 1 capsule by mouth daily.   fluticasone  50 MCG/ACT nasal spray Commonly known as: Flonase  Place 2 sprays into both nostrils daily.   methocarbamol 500 MG tablet Commonly known as: ROBAXIN Take 1 tablet (500 mg total) by mouth 3 (three) times daily.   montelukast  10 MG tablet Commonly known as: SINGULAIR  Take 1 tablet by mouth daily   oxyCODONE  5 MG immediate release tablet Commonly known as: Oxy IR/ROXICODONE  Take 1-2 tablets (5-10 mg total) by mouth every 4 (four) hours as needed for moderate pain (pain score 4-6).   polyethylene glycol 17 g packet Commonly known as: MIRALAX  / GLYCOLAX  Take 17 g by mouth daily.   rosuvastatin  20 MG tablet Commonly known as: CRESTOR  TAKE 1 TABLET(20 MG) BY MOUTH DAILY What changed: See the new instructions.   tamsulosin  0.4 MG Caps capsule Commonly known as: FLOMAX  Take 1 capsule (0.4 mg total) by mouth daily.   Vitamin D 50 MCG (2000 UT) tablet Take 2,000 Units by mouth daily.   zinc gluconate 50 MG tablet Take 50 mg by mouth daily.       No Known Allergies    The results of significant diagnostics from this hospitalization  (including imaging, microbiology, ancillary and laboratory) are listed below for reference.    Significant Diagnostic Studies: DG HIP UNILAT WITH PELVIS 2-3 VIEWS RIGHT Result Date: 12/12/2023 CLINICAL DATA:  Fracture. EXAM: DG HIP (WITH OR WITHOUT PELVIS) 2-3V RIGHT COMPARISON:  None Available. FINDINGS: There is no evidence of hip fracture or dislocation. Mild osteophyte formation of right hip is noted. IMPRESSION: Mild degenerative joint disease of right hip. No acute abnormality seen. Electronically Signed   By: Rosalene Colon M.D.   On: 12/12/2023 14:42   DG Foot Complete Right Result Date: 12/12/2023 CLINICAL DATA:  Open wound on lateral aspect of right foot for 4-6 weeks. EXAM: RIGHT FOOT COMPLETE - 3+ VIEW COMPARISON:  Radiograph 11/29/2023 and subsequent foot radiographs and MRI. FINDINGS: Lucency about the fifth metatarsal head with adjacent soft tissue swelling. Medial subluxation of the fifth proximal metatarsal in relation to the fifth metatarsal head. IMPRESSION: Osteomyelitis of the fifth metatarsal head. Electronically Signed   By: Rozell Cornet M.D.   On: 12/12/2023 02:48   DG Foot 2 Views Right Result Date: 12/12/2023 CLINICAL DATA:  Open sore on right foot  EXAM: RIGHT FOOT - 2 VIEW COMPARISON:  Subsequent foot radiographs and MRI FINDINGS: Demineralization. Degenerative changes about the IP joints and midfoot. No radiographic evidence of osteomyelitis. IMPRESSION: No radiographic evidence of osteomyelitis. Electronically Signed   By: Rozell Cornet M.D.   On: 12/12/2023 02:47   DG Foot 2 Views Right Result Date: 12/11/2023 CLINICAL DATA:  Status post amputation. EXAM: RIGHT FOOT - 2 VIEW COMPARISON:  Right foot radiographs 12/10/2023, MRI right forefoot 12/10/2023 FINDINGS: Interval amputation of the fifth ray to the proximal metatarsal shaft. There are antibiotic beads within the amputation bed. Distal lateral surgical skin staples. Diffuse decreased bone mineralization. Mild hallux  valgus. Minimal chronic enthesopathic change at the Achilles insertion on the calcaneus. IMPRESSION: Interval amputation of the fifth ray to the proximal metatarsal shaft. Electronically Signed   By: Bertina Broccoli M.D.   On: 12/11/2023 14:06   MR FOOT RIGHT WO CONTRAST Result Date: 12/10/2023 CLINICAL DATA:  Draining lateral forefoot wound. Osteomyelitis suspected. EXAM: MRI OF THE RIGHT FOREFOOT WITHOUT CONTRAST TECHNIQUE: Multiplanar, multisequence MR imaging of the right forefoot was performed. No intravenous contrast was administered. COMPARISON:  Prior radiographs 12/10/2023, 12/06/2023 and 11/29/2023. FINDINGS: Bones/Joint/Cartilage There is an open wound along the plantar aspect of the 5th metatarsophalangeal joint. There is posterior dislocation at the 5th metatarsophalangeal joint with partial exposure of the 5th metatarsal head and a moderate effusion of the 5th MTP joint. There is abnormal T1 hypointensity and T2 hyperintensity within the 5th metatarsal head with associated cortical destruction, consistent with osteomyelitis. Marrow edema extends into the 5th metatarsal neck. Mild nonspecific T2 marrow hyperintensity within the base of the 5th proximal phalanx. Aside from hammertoe deformities, the additional metatarsals and digits appear normal. There are no other significant joint effusions. The visualized tarsal bones appear unremarkable. Ligaments Intact Lisfranc ligament. The collateral ligaments of the 5th metatarsophalangeal joint are not well visualized. Muscles and Tendons Mild generalized forefoot muscular atrophy and edema. The abductor digiti minimi tendon is posterolaterally subluxed within the small toe, although grossly intact. There is some fluid within its sheath. Soft tissues As above, open wound along the plantar aspect of the lateral forefoot with possible partial exposure of the 5th metatarsal head. There is surrounding soft tissue inflammation without organized extra articular  fluid collection. IMPRESSION: 1. Open wound along the plantar aspect of the lateral forefoot with possible partial exposure of the 5th metatarsal head. 2. Posterior dislocation at the 5th metatarsophalangeal joint with associated osteomyelitis of the 5th metatarsal head and neck. 3. Moderate effusion of the 5th MTP joint, suspicious for septic arthritis. Possible septic tenosynovitis of the abductor digiti minimi tendon which is posterolaterally subluxed within the small toe. 4. No organized extra articular fluid collection. . Electronically Signed   By: Elmon Hagedorn M.D.   On: 12/10/2023 12:53   DG Foot Complete Right Result Date: 12/10/2023 CLINICAL DATA:  Foot ulcer. EXAM: RIGHT FOOT COMPLETE - 3+ VIEW COMPARISON:  Dec 06, 2023. FINDINGS: There is no evidence of fracture or dislocation. There is no evidence of arthropathy. Lucency is seen involving the distal fifth metatarsal and possible osteomyelitis cannot be excluded. Overlying bandage and soft tissue wound is noted. Vascular calcifications are noted. IMPRESSION: Possible lucency involving distal fifth metatarsal suggesting possible osteomyelitis. MRI may be performed for further evaluation. Electronically Signed   By: Rosalene Colon M.D.   On: 12/10/2023 08:53    Microbiology: Recent Results (from the past 240 hours)  Culture, blood (Routine x 2)  Status: None   Collection Time: 12/10/23  7:54 AM   Specimen: BLOOD  Result Value Ref Range Status   Specimen Description   Final    BLOOD RIGHT ANTECUBITAL Performed at [facility], [address] [place], [place] [address]    Special Requests   Final    BOTTLES DRAWN AEROBIC AND ANAEROBIC Blood Culture adequate volume Performed at [facility], [address] [place], [place] [address]    Culture   Final    NO GROWTH 5 DAYS Performed at [facility], [address], [place], [place] [address]    Report Status 12/15/2023 FINAL  Final   Culture, blood (Routine x 2)     Status: None   Collection Time: 12/10/23  7:59 AM   Specimen: BLOOD  Result Value Ref Range Status   Specimen Description   Final    BLOOD BLOOD LEFT FOREARM Performed at [facility], [address] [place], [place] [address]    Special Requests   Final    BOTTLES DRAWN AEROBIC AND ANAEROBIC Blood Culture adequate volume Performed at [facility], [address], [place], [place] [address]    Culture   Final    NO GROWTH 5 DAYS Performed at [facility], [address] [place], [place] [address]    Report Status 12/15/2023 FINAL  Final  MRSA Next Gen by PCR, Nasal     Status: None   Collection Time: 12/11/23  8:52 AM   Specimen: Nasal Mucosa; Nasal Swab  Result Value Ref Range Status   MRSA by PCR Next Gen NOT DETECTED NOT DETECTED Final    Comment: (NOTE) The GeneXpert MRSA Assay (FDA approved for NASAL specimens only), is one component of a comprehensive MRSA colonization surveillance program. It is not intended to diagnose MRSA infection nor to guide or monitor treatment for MRSA infections. Test performance is not FDA approved in patients less than 62 years old. Performed at [facility], [address] [place], [place] [address]   Aerobic/Anaerobic Culture w Gram Stain (surgical/deep wound)     Status: None (Preliminary result)   Collection Time: 12/11/23 10:49 AM   Specimen: Bone; Tissue  Result Value Ref Range Status   Specimen Description BONE  Final   Special Requests NONE  Final   Gram Stain   Final    RARE WBC PRESENT, PREDOMINANTLY MONONUCLEAR NO ORGANISMS SEEN Performed at [facility], [address] [place], [place] [address]    Culture   Final    RARE METHICILLIN RESISTANT STAPHYLOCOCCUS AUREUS NO ANAEROBES ISOLATED; CULTURE IN PROGRESS FOR 5 DAYS    Report Status PENDING  Incomplete   Organism ID, Bacteria METHICILLIN RESISTANT STAPHYLOCOCCUS AUREUS  Final       Susceptibility   Methicillin resistant staphylococcus aureus - MIC*    CIPROFLOXACIN >=8 RESISTANT Resistant     ERYTHROMYCIN >=8 RESISTANT Resistant     GENTAMICIN <=0.5 SENSITIVE Sensitive     OXACILLIN >=4 RESISTANT Resistant     TETRACYCLINE <=1 SENSITIVE Sensitive     VANCOMYCIN  1 SENSITIVE Sensitive     TRIMETH /SULFA  <=10 SENSITIVE Sensitive     CLINDAMYCIN <=0.25 SENSITIVE Sensitive     RIFAMPIN <=0.5 SENSITIVE Sensitive     Inducible Clindamycin NEGATIVE Sensitive     LINEZOLID 2 SENSITIVE Sensitive     * RARE METHICILLIN RESISTANT STAPHYLOCOCCUS AUREUS     Labs: Basic Metabolic Panel: Recent  Labs  Lab 12/12/23 0636 12/13/23 0559 12/14/23 0510 12/15/23 0640 12/16/23 0606  NA 137 137 135 135 135  K 4.4 3.7 4.0 4.5 4.3  CL 106 103 103 102 101  CO2 23 26 24 26 25   GLUCOSE 104* 99 103* 107* 108*  BUN 10 10 15 20 20   CREATININE 0.96 0.84 0.85 0.86 0.89  CALCIUM  8.5* 8.7* 8.6* 8.9 8.8*  MG 2.2  --   --   --   --    Liver Function Tests: Recent Labs  Lab 12/10/23 0754 12/11/23 0537  AST 79* 55*  ALT 95* 72*  ALKPHOS 80 70  BILITOT 1.2 1.0  PROT 7.2 6.3*  ALBUMIN  3.2* 2.6*   No results for input(s): "LIPASE", "AMYLASE" in the last 168 hours. No results for input(s): "AMMONIA" in the last 168 hours. CBC: Recent Labs  Lab 12/12/23 0636 12/13/23 0559 12/14/23 0510 12/15/23 0640 12/16/23 0606  WBC 12.3* 13.2* 13.1* 13.7* 14.8*  NEUTROABS 8.6* 8.3* 8.2* 10.5* PENDING  HGB 11.9* 11.8* 11.4* 10.9* 11.1*  HCT 34.0* 33.6* 32.5* 31.6* 31.8*  MCV 94.7 93.1 94.2 93.2 93.8  PLT 386 397 412* 392 405*   Cardiac Enzymes: No results for input(s): "CKTOTAL", "CKMB", "CKMBINDEX", "TROPONINI" in the last 168 hours. BNP: BNP (last 3 results) No results for input(s): "BNP" in the last 8760 hours.  ProBNP (last 3 results) No results for input(s): "PROBNP" in the last 8760 hours.  CBG: Recent Labs  Lab 12/15/23 0651 12/15/23 1150 12/15/23 1644 12/15/23 2131  12/16/23 0628  GLUCAP 101* 87 140* 102* 115*    Signed:  Verlie Glisson MD   [facility] 12/16/2023, 9:43 AM

## 2023-12-16 NOTE — Progress Notes (Signed)
  Subjective:  Patient ID: [name], male    DOB: Feb 06, 1936,  MRN: [id number]  Chief Complaint  Patient presents with   Wound Infection    DOS: 12/11/2023 Procedure: 1.  Partial fifth ray amputation with excision of ulceration, right foot 2.  Application dissolvable antibiotic beads, right foot 3.  Application dermal allograft, 38 cm, right foot  88 y.o. male seen for post op check.  Pt seen reports some bleeding into dressing over weekend, says he is having some pain but does ok with pain meds. Aware of plan for nursing facility and follow up next week.   Review of Systems: Negative except as noted in the HPI. Denies N/V/F/Ch.   Objective:   Constitutional Well developed. Well nourished.  Vascular Foot warm and well perfused. Capillary refill normal to all digits.   No calf pain with palpation  Neurologic Normal speech. Oriented to person, place, and time. Epicritic sensation diminished to right foot  Dermatologic Amp site viable no necrosis, dehiscence but some serosanguinus drainage and associated maceration.     Orthopedic: Status post right partial fifth ray amputation   Radiographs: Amputation of fifth ray at the proximal aspect of the fifth metatarsal  Pathology: A. TOE, RIGHT FIFTH, AMPUTATION:  Ulceration with acute inflammation and acute osteomyelitis.   B. BONE, RIGHT FIFTH, PROXIMAL, MARGIN:  Benign bone and connective tissue.  Negative for osteomyelitis.   Micro:  Staph aureus   Assessment:   1. Wound infection   2. Osteomyelitis of left foot, unspecified type (HCC)   Status post right partial fifth ray amputation  Plan:  Patient was evaluated and treated and all questions answered.  POD # 5 s/p partial fifth ray amputation right foot -Progressing as expected postoperatively.  Amp site viable no evidence residual infection. Proximal margin negative for OM - Plan for SNF discharge -XR: Expected postop changes -WB Status: Nonweightbearing in  soft dressing while in bed and postop shoe while up with PT -Sutures: Remain intact. -Medications/ABX:   14 days doxycyline on DC -Dressing: Change Right foot dressing every other day starting 5/14 at SNF. Cleanse incision site with betadine, apply non adherent gauze or silver  alginate dressing, then apply 4x4 guaze, abd pad, kerlix and ace wrap.  - F/u Plan: Follow up next week in office, will have them call to arrange.  - Will sign off please msg with questions        Maridee Shoemaker, DPM [facility] / [facility]

## 2023-12-17 ENCOUNTER — Other Ambulatory Visit: Payer: Self-pay

## 2023-12-17 DIAGNOSIS — R7303 Prediabetes: Secondary | ICD-10-CM | POA: Diagnosis not present

## 2023-12-17 DIAGNOSIS — I69951 Hemiplegia and hemiparesis following unspecified cerebrovascular disease affecting right dominant side: Secondary | ICD-10-CM | POA: Diagnosis not present

## 2023-12-17 DIAGNOSIS — M1611 Unilateral primary osteoarthritis, right hip: Secondary | ICD-10-CM | POA: Diagnosis not present

## 2023-12-17 DIAGNOSIS — R2689 Other abnormalities of gait and mobility: Secondary | ICD-10-CM | POA: Diagnosis not present

## 2023-12-17 DIAGNOSIS — S99929A Unspecified injury of unspecified foot, initial encounter: Secondary | ICD-10-CM | POA: Diagnosis not present

## 2023-12-17 DIAGNOSIS — R1312 Dysphagia, oropharyngeal phase: Secondary | ICD-10-CM | POA: Diagnosis not present

## 2023-12-17 DIAGNOSIS — R1030 Lower abdominal pain, unspecified: Secondary | ICD-10-CM | POA: Diagnosis not present

## 2023-12-17 DIAGNOSIS — Z7401 Bed confinement status: Secondary | ICD-10-CM | POA: Diagnosis not present

## 2023-12-17 DIAGNOSIS — G8191 Hemiplegia, unspecified affecting right dominant side: Secondary | ICD-10-CM | POA: Diagnosis not present

## 2023-12-17 DIAGNOSIS — R41841 Cognitive communication deficit: Secondary | ICD-10-CM | POA: Diagnosis not present

## 2023-12-17 DIAGNOSIS — Z7902 Long term (current) use of antithrombotics/antiplatelets: Secondary | ICD-10-CM | POA: Diagnosis not present

## 2023-12-17 DIAGNOSIS — E441 Mild protein-calorie malnutrition: Secondary | ICD-10-CM | POA: Diagnosis not present

## 2023-12-17 DIAGNOSIS — T8189XA Other complications of procedures, not elsewhere classified, initial encounter: Secondary | ICD-10-CM | POA: Diagnosis not present

## 2023-12-17 DIAGNOSIS — Z7982 Long term (current) use of aspirin: Secondary | ICD-10-CM | POA: Diagnosis not present

## 2023-12-17 DIAGNOSIS — M6259 Muscle wasting and atrophy, not elsewhere classified, multiple sites: Secondary | ICD-10-CM | POA: Diagnosis not present

## 2023-12-17 DIAGNOSIS — Z741 Need for assistance with personal care: Secondary | ICD-10-CM | POA: Diagnosis not present

## 2023-12-17 DIAGNOSIS — M6281 Muscle weakness (generalized): Secondary | ICD-10-CM | POA: Diagnosis not present

## 2023-12-17 DIAGNOSIS — I739 Peripheral vascular disease, unspecified: Secondary | ICD-10-CM | POA: Diagnosis not present

## 2023-12-17 DIAGNOSIS — Z951 Presence of aortocoronary bypass graft: Secondary | ICD-10-CM | POA: Diagnosis not present

## 2023-12-17 DIAGNOSIS — M869 Osteomyelitis, unspecified: Secondary | ICD-10-CM | POA: Diagnosis not present

## 2023-12-17 DIAGNOSIS — I4891 Unspecified atrial fibrillation: Secondary | ICD-10-CM | POA: Diagnosis not present

## 2023-12-17 DIAGNOSIS — Z4789 Encounter for other orthopedic aftercare: Secondary | ICD-10-CM | POA: Diagnosis not present

## 2023-12-17 LAB — CBC WITH DIFFERENTIAL/PLATELET
Abs Immature Granulocytes: 1.03 10*3/uL — ABNORMAL HIGH (ref 0.00–0.07)
Basophils Absolute: 0.1 10*3/uL (ref 0.0–0.1)
Basophils Relative: 0 %
Eosinophils Absolute: 1.2 10*3/uL — ABNORMAL HIGH (ref 0.0–0.5)
Eosinophils Relative: 9 %
HCT: 32.3 % — ABNORMAL LOW (ref 39.0–52.0)
Hemoglobin: 11 g/dL — ABNORMAL LOW (ref 13.0–17.0)
Immature Granulocytes: 8 %
Lymphocytes Relative: 9 %
Lymphs Abs: 1.2 10*3/uL (ref 0.7–4.0)
MCH: 32.1 pg (ref 26.0–34.0)
MCHC: 34.1 g/dL (ref 30.0–36.0)
MCV: 94.2 fL (ref 80.0–100.0)
Monocytes Absolute: 1.6 10*3/uL — ABNORMAL HIGH (ref 0.1–1.0)
Monocytes Relative: 12 %
Neutro Abs: 8.3 10*3/uL — ABNORMAL HIGH (ref 1.7–7.7)
Neutrophils Relative %: 62 %
Platelets: 405 10*3/uL — ABNORMAL HIGH (ref 150–400)
RBC: 3.43 MIL/uL — ABNORMAL LOW (ref 4.22–5.81)
RDW: 15.8 % — ABNORMAL HIGH (ref 11.5–15.5)
Smear Review: NORMAL
WBC: 13.4 10*3/uL — ABNORMAL HIGH (ref 4.0–10.5)
nRBC: 0.2 % (ref 0.0–0.2)

## 2023-12-17 LAB — BASIC METABOLIC PANEL WITH GFR
Anion gap: 12 (ref 5–15)
BUN: 17 mg/dL (ref 8–23)
CO2: 22 mmol/L (ref 22–32)
Calcium: 9.2 mg/dL (ref 8.9–10.3)
Chloride: 101 mmol/L (ref 98–111)
Creatinine, Ser: 0.92 mg/dL (ref 0.61–1.24)
GFR, Estimated: >60 mL/min (ref >60)
Glucose, Bld: 102 mg/dL — ABNORMAL HIGH (ref 70–99)
Potassium: 5.1 mmol/L (ref 3.5–5.1)
Sodium: 135 mmol/L (ref 135–145)

## 2023-12-17 MED ORDER — ROSUVASTATIN CALCIUM 20 MG PO TABS: 20.0000 mg | ORAL_TABLET | Freq: Every day | ORAL | 1 refills | Status: DC

## 2023-12-17 NOTE — TOC Transition Note (Signed)
 Transition of Care [facility]) - Discharge Note   Patient Details  Name: [name] MRN: [id number] Date of Birth: 1936/02/23  [facility]) CM/SW Contact:  Elspeth Hals, LCSW Phone Number: 12/17/2023, 11:34 AM   Clinical Narrative:   Pt discharging to [facility], room 804A.  RN call report to [phone].  PTAR called [phone].    Final next level of care: Skilled Nursing Facility Barriers to Discharge: No Barriers Identified   Patient Goals and CMS Choice            Discharge Placement              Patient chooses bed at: [facility] Patient to be transferred to facility by: ptar Name of family member notified: pt sister and niece in the room Patient and family notified of of transfer: 12/17/23  Discharge Plan and Services Additional resources added to the After Visit Summary for                                       Social Drivers of Health (SDOH) Interventions SDOH Screenings   Food Insecurity: No Food Insecurity (12/10/2023)  Housing: Unknown (12/10/2023)  Transportation Needs: No Transportation Needs (12/10/2023)  Utilities: Not At Risk (12/10/2023)  Alcohol Screen: Low Risk  (05/29/2023)  Depression (PHQ2-9): Low Risk  (08/28/2023)  Financial Resource Strain: Low Risk  (05/29/2023)  Physical Activity: Insufficiently Active (05/29/2023)  Social Connections: Socially Isolated (12/10/2023)  Stress: No Stress Concern Present (05/29/2023)  Tobacco Use: Low Risk  (12/11/2023)  Health Literacy: Inadequate Health Literacy (05/29/2023)     Readmission Risk Interventions     No data to display

## 2023-12-17 NOTE — Plan of Care (Signed)
   Problem: Education: Goal: Knowledge of General Education information will improve Description: Including pain rating scale, medication(s)/side effects and non-pharmacologic comfort measures Outcome: Progressing   Problem: Activity: Goal: Risk for activity intolerance will decrease Outcome: Progressing   Problem: Elimination: Goal: Will not experience complications related to bowel motility Outcome: Progressing

## 2023-12-17 NOTE — Discharge Summary (Signed)
 Physician Discharge Summary  [name] VFI:[id number] DOB: 1936/06/19 DOA: 12/10/2023  PCP: Susanna Epley, FNP  Admit date: 12/10/2023 Discharge date: 12/17/2023  Patient seen and examined no acute changes from prior discharge summary can go to rehab whenever bed available  Time spent: 24 minutes  Recommendations for Outpatient Follow-up:  Requires close follow-up with Dr. Rosemarie Conquest of podiatry-CC him on this note Complete 14 days more of doxycycline as per podiatry regarding lower extremity osteomyelitis Get Chem-7 CBC in about 1 week at facility can be nonweightbearing to right foot with postop shoe Check A1c in 2 to 3 months  Discharge Diagnoses:  MAIN problem for hospitalization   Tenosynovitis and osteomyelitis fifth metatarsal head status post fifth ray partial excision right foot  Please see below for itemized issues addressed in HOpsital- refer to other progress notes for clarity if needed  Discharge Condition: Improved  Diet recommendation: Diabetic-needs soft mechanical diet as cannot chew properly  [facility] Weights   12/10/23 0741 12/11/23 0913  Weight: 80 kg 80 kg    History of present illness:  88 year old-at baseline uses cane for ambulation?  Motorized wheelchair HTN peripheral vascular disease CVA 2001 with right-sided weakness CABG X3 LIMA-LAD SVG-diagonal/RCA and left atrial appendage thrombus removal with ligation and application of clip Patient follows chronically with podiatry for right foot wound and was evaluated on 11/18/2023 with debridement of overlying callus and placed in the offloading shoe Patient was seen at wound care on 5/2 and started on amoxicillin and doxycycline because of oozing yellow pus and bright red blood from the area MR right foot showed open wound plantar aspect exposure fifth metatarsal head dislocation of fifth metatarsophalangeal joint and osteomyelitis fifth metatarsal head neck with septic tenosynovitis of abductor digiti minimi  tendon 5/7 underwent fifth partial ray excision of ulceration right foot application of dissolvable beads and dermal allograft placement   Plan   Sepsis tenosynovitis osteomyelitis fifth metatarsal head--Status post procedure as above- Rare Staphylococcus wound culture 5/7-blood cultures -5/6-wound margins clean-Rocephin  changed to doxycycline-podiatry recommending 14 days extra on discharge so I will prescribe the same Pain mod control Tylenol  650 every 6 as needed mild pain, add Oxyir 5-10 mg every 4 as needed moderate pain and prescription was given for the same and will use also some robaxin 500 tid for spasm He had some bleeding from wound site which was expected and this was redressed over the weekend by Dr. Luster Salters is a little bit better-outpatient follow-up with wound   Chronic stroke with right-sided deficits 2001 Back on aspirin  81, Plavix  75 Awaiting rehab placement as he is unable to navigate move around with his chronic right-sided debility   Right hip pain Right hip x-rays show only arthritis with mild degenerative changes and no acute abnormality and he is moving it better   CABG X3 2023 Crestor  as outpatient continue Norvasc  2.5 Continues on aspirin  81 daily, Plavix  75 daily   Paroxysmal A-fib?  CHADVASC >4 Not on any anticoagulation?  No rate control-monitor trends   Prediabetes-A1c 6.0 Not on meds-outpatient follow-up-sliding scale show sugars predominantly below 120 therefore discontinue coverage Get an A1c in the outpatient setting  He has a history of OHSS but is not compliant on CPAP and has been stable on oxygen without any confusion or desats overnight  Discharge Exam: Vitals:   12/17/23 0417 12/17/23 0827  BP: (!) 122/59 126/77  Pulse: 62 69  Resp:  18  Temp: 97.7 F (36.5 C) 98.5 F (36.9 C)  SpO2: 99%  99%    Subj on day of d/c   Looks well  No distress    General Exam on discharge  Eomi ncat Chest clear  Abdomen soft nmt nd no reboudn no  guard R LE in re-inforced dressing  Discharge Instructions   Discharge Instructions     Diet - low sodium heart healthy   Complete by: As directed    Increase activity slowly   Complete by: As directed       Allergies as of 12/17/2023   No Known Allergies      Medication List     STOP taking these medications    amoxicillin-clavulanate 875-125 MG tablet Commonly known as: AUGMENTIN   cetirizine 10 MG tablet Commonly known as: ZYRTEC       TAKE these medications    acetaminophen  325 MG tablet Commonly known as: TYLENOL  Take 2 tablets (650 mg total) by mouth every 4 (four) hours as needed for headache or mild pain.   amLODipine  2.5 MG tablet Commonly known as: NORVASC  TAKE 1 TABLET(2.5 MG) BY MOUTH DAILY What changed: See the new instructions.   aspirin  EC 81 MG tablet Take 81 mg by mouth daily.   clopidogrel  75 MG tablet Commonly known as: PLAVIX  Take 1 tablet (75 mg total) by mouth daily.   doxycycline 100 MG tablet Commonly known as: VIBRA-TABS Take 1 tablet (100 mg total) by mouth every 12 (twelve) hours for 14 days. What changed: when to take this   ELDERBERRY IMMUNE COMPLEX PO Take 1 capsule by mouth daily.   fluticasone  50 MCG/ACT nasal spray Commonly known as: Flonase  Place 2 sprays into both nostrils daily.   methocarbamol 500 MG tablet Commonly known as: ROBAXIN Take 1 tablet (500 mg total) by mouth 3 (three) times daily.   montelukast  10 MG tablet Commonly known as: SINGULAIR  Take 1 tablet by mouth daily   oxyCODONE  5 MG immediate release tablet Commonly known as: Oxy IR/ROXICODONE  Take 1-2 tablets (5-10 mg total) by mouth every 4 (four) hours as needed for moderate pain (pain score 4-6).   polyethylene glycol 17 g packet Commonly known as: MIRALAX  / GLYCOLAX  Take 17 g by mouth daily.   rosuvastatin  20 MG tablet Commonly known as: CRESTOR  Take 1 tablet (20 mg total) by mouth daily. What changed: See the new instructions.    tamsulosin  0.4 MG Caps capsule Commonly known as: FLOMAX  Take 1 capsule (0.4 mg total) by mouth daily.   Vitamin D 50 MCG (2000 UT) tablet Take 2,000 Units by mouth daily.   zinc gluconate 50 MG tablet Take 50 mg by mouth daily.        No Known Allergies    The results of significant diagnostics from this hospitalization (including imaging, microbiology, ancillary and laboratory) are listed below for reference.    Significant Diagnostic Studies: DG HIP UNILAT WITH PELVIS 2-3 VIEWS RIGHT Result Date: 12/12/2023 CLINICAL DATA:  Fracture. EXAM: DG HIP (WITH OR WITHOUT PELVIS) 2-3V RIGHT COMPARISON:  None Available. FINDINGS: There is no evidence of hip fracture or dislocation. Mild osteophyte formation of right hip is noted. IMPRESSION: Mild degenerative joint disease of right hip. No acute abnormality seen. Electronically Signed   By: Rosalene Colon M.D.   On: 12/12/2023 14:42   DG Foot Complete Right Result Date: 12/12/2023 CLINICAL DATA:  Open wound on lateral aspect of right foot for 4-6 weeks. EXAM: RIGHT FOOT COMPLETE - 3+ VIEW COMPARISON:  Radiograph 11/29/2023 and subsequent foot radiographs and MRI. FINDINGS:  Lucency about the fifth metatarsal head with adjacent soft tissue swelling. Medial subluxation of the fifth proximal metatarsal in relation to the fifth metatarsal head. IMPRESSION: Osteomyelitis of the fifth metatarsal head. Electronically Signed   By: Rozell Cornet M.D.   On: 12/12/2023 02:48   DG Foot 2 Views Right Result Date: 12/12/2023 CLINICAL DATA:  Open sore on right foot EXAM: RIGHT FOOT - 2 VIEW COMPARISON:  Subsequent foot radiographs and MRI FINDINGS: Demineralization. Degenerative changes about the IP joints and midfoot. No radiographic evidence of osteomyelitis. IMPRESSION: No radiographic evidence of osteomyelitis. Electronically Signed   By: Rozell Cornet M.D.   On: 12/12/2023 02:47   DG Foot 2 Views Right Result Date: 12/11/2023 CLINICAL DATA:  Status  post amputation. EXAM: RIGHT FOOT - 2 VIEW COMPARISON:  Right foot radiographs 12/10/2023, MRI right forefoot 12/10/2023 FINDINGS: Interval amputation of the fifth ray to the proximal metatarsal shaft. There are antibiotic beads within the amputation bed. Distal lateral surgical skin staples. Diffuse decreased bone mineralization. Mild hallux valgus. Minimal chronic enthesopathic change at the Achilles insertion on the calcaneus. IMPRESSION: Interval amputation of the fifth ray to the proximal metatarsal shaft. Electronically Signed   By: Bertina Broccoli M.D.   On: 12/11/2023 14:06   MR FOOT RIGHT WO CONTRAST Result Date: 12/10/2023 CLINICAL DATA:  Draining lateral forefoot wound. Osteomyelitis suspected. EXAM: MRI OF THE RIGHT FOREFOOT WITHOUT CONTRAST TECHNIQUE: Multiplanar, multisequence MR imaging of the right forefoot was performed. No intravenous contrast was administered. COMPARISON:  Prior radiographs 12/10/2023, 12/06/2023 and 11/29/2023. FINDINGS: Bones/Joint/Cartilage There is an open wound along the plantar aspect of the 5th metatarsophalangeal joint. There is posterior dislocation at the 5th metatarsophalangeal joint with partial exposure of the 5th metatarsal head and a moderate effusion of the 5th MTP joint. There is abnormal T1 hypointensity and T2 hyperintensity within the 5th metatarsal head with associated cortical destruction, consistent with osteomyelitis. Marrow edema extends into the 5th metatarsal neck. Mild nonspecific T2 marrow hyperintensity within the base of the 5th proximal phalanx. Aside from hammertoe deformities, the additional metatarsals and digits appear normal. There are no other significant joint effusions. The visualized tarsal bones appear unremarkable. Ligaments Intact Lisfranc ligament. The collateral ligaments of the 5th metatarsophalangeal joint are not well visualized. Muscles and Tendons Mild generalized forefoot muscular atrophy and edema. The abductor digiti minimi  tendon is posterolaterally subluxed within the small toe, although grossly intact. There is some fluid within its sheath. Soft tissues As above, open wound along the plantar aspect of the lateral forefoot with possible partial exposure of the 5th metatarsal head. There is surrounding soft tissue inflammation without organized extra articular fluid collection. IMPRESSION: 1. Open wound along the plantar aspect of the lateral forefoot with possible partial exposure of the 5th metatarsal head. 2. Posterior dislocation at the 5th metatarsophalangeal joint with associated osteomyelitis of the 5th metatarsal head and neck. 3. Moderate effusion of the 5th MTP joint, suspicious for septic arthritis. Possible septic tenosynovitis of the abductor digiti minimi tendon which is posterolaterally subluxed within the small toe. 4. No organized extra articular fluid collection. . Electronically Signed   By: Elmon Hagedorn M.D.   On: 12/10/2023 12:53   DG Foot Complete Right Result Date: 12/10/2023 CLINICAL DATA:  Foot ulcer. EXAM: RIGHT FOOT COMPLETE - 3+ VIEW COMPARISON:  Dec 06, 2023. FINDINGS: There is no evidence of fracture or dislocation. There is no evidence of arthropathy. Lucency is seen involving the distal fifth metatarsal and possible osteomyelitis cannot be  excluded. Overlying bandage and soft tissue wound is noted. Vascular calcifications are noted. IMPRESSION: Possible lucency involving distal fifth metatarsal suggesting possible osteomyelitis. MRI may be performed for further evaluation. Electronically Signed   By: Rosalene Colon M.D.   On: 12/10/2023 08:53    Microbiology: Recent Results (from the past 240 hours)  Culture, blood (Routine x 2)     Status: None   Collection Time: 12/10/23  7:54 AM   Specimen: BLOOD  Result Value Ref Range Status   Specimen Description   Final    BLOOD RIGHT ANTECUBITAL Performed at [facility], [address], [place], [place] [address]    Special  Requests   Final    BOTTLES DRAWN AEROBIC AND ANAEROBIC Blood Culture adequate volume Performed at [facility], [address], [place], [place] [address]    Culture   Final    NO GROWTH 5 DAYS Performed at [facility], [address] [place], [place] [address]    Report Status 12/15/2023 FINAL  Final  Culture, blood (Routine x 2)     Status: None   Collection Time: 12/10/23  7:59 AM   Specimen: BLOOD  Result Value Ref Range Status   Specimen Description   Final    BLOOD BLOOD LEFT FOREARM Performed at [facility], [address] [place], [place] [address]    Special Requests   Final    BOTTLES DRAWN AEROBIC AND ANAEROBIC Blood Culture adequate volume Performed at [facility], [address] [place], [place] [address]    Culture   Final    NO GROWTH 5 DAYS Performed at [facility], [address], [place], [place] [address]    Report Status 12/15/2023 FINAL  Final  MRSA Next Gen by PCR, Nasal     Status: None   Collection Time: 12/11/23  8:52 AM   Specimen: Nasal Mucosa; Nasal Swab  Result Value Ref Range Status   MRSA by PCR Next Gen NOT DETECTED NOT DETECTED Final    Comment: (NOTE) The GeneXpert MRSA Assay (FDA approved for NASAL specimens only), is one component of a comprehensive MRSA colonization surveillance program. It is not intended to diagnose MRSA infection nor to guide or monitor treatment for MRSA infections. Test performance is not FDA approved in patients less than 52 years old. Performed at [facility], [address] [place], [place] [address]   Aerobic/Anaerobic Culture w Gram Stain (surgical/deep wound)     Status: None   Collection Time: 12/11/23 10:49 AM   Specimen: Bone; Tissue  Result Value Ref Range Status   Specimen Description BONE  Final   Special Requests NONE  Final   Gram Stain   Final    RARE WBC PRESENT, PREDOMINANTLY MONONUCLEAR NO ORGANISMS SEEN     Culture   Final    RARE METHICILLIN RESISTANT STAPHYLOCOCCUS AUREUS NO ANAEROBES ISOLATED Performed at [facility], [address], [place], [place] [address]    Report Status 12/16/2023 FINAL  Final   Organism ID, Bacteria METHICILLIN RESISTANT STAPHYLOCOCCUS AUREUS  Final      Susceptibility   Methicillin resistant staphylococcus aureus - MIC*    CIPROFLOXACIN >=8 RESISTANT Resistant     ERYTHROMYCIN >=8 RESISTANT Resistant     GENTAMICIN <=0.5 SENSITIVE Sensitive     OXACILLIN >=4 RESISTANT Resistant     TETRACYCLINE <=1 SENSITIVE Sensitive     VANCOMYCIN  1 SENSITIVE Sensitive  TRIMETH /SULFA  <=10 SENSITIVE Sensitive     CLINDAMYCIN <=0.25 SENSITIVE Sensitive     RIFAMPIN <=0.5 SENSITIVE Sensitive     Inducible Clindamycin NEGATIVE Sensitive     LINEZOLID 2 SENSITIVE Sensitive     * RARE METHICILLIN RESISTANT STAPHYLOCOCCUS AUREUS     Labs: Basic Metabolic Panel: Recent Labs  Lab 12/12/23 0636 12/13/23 0559 12/14/23 0510 12/15/23 0640 12/16/23 0606 12/17/23 0616  NA 137 137 135 135 135 135  K 4.4 3.7 4.0 4.5 4.3 5.1  CL 106 103 103 102 101 101  CO2 23 26 24 26 25 22   GLUCOSE 104* 99 103* 107* 108* 102*  BUN 10 10 15 20 20 17   CREATININE 0.96 0.84 0.85 0.86 0.89 0.92  CALCIUM  8.5* 8.7* 8.6* 8.9 8.8* 9.2  MG 2.2  --   --   --   --   --    Liver Function Tests: Recent Labs  Lab 12/11/23 0537  AST 55*  ALT 72*  ALKPHOS 70  BILITOT 1.0  PROT 6.3*  ALBUMIN  2.6*   No results for input(s): "LIPASE", "AMYLASE" in the last 168 hours. No results for input(s): "AMMONIA" in the last 168 hours. CBC: Recent Labs  Lab 12/13/23 0559 12/14/23 0510 12/15/23 0640 12/16/23 0606 12/17/23 0616  WBC 13.2* 13.1* 13.7* 14.8* 13.4*  NEUTROABS 8.3* 8.2* 10.5* 11.2* PENDING  HGB 11.8* 11.4* 10.9* 11.1* 11.0*  HCT 33.6* 32.5* 31.6* 31.8* 32.3*  MCV 93.1 94.2 93.2 93.8 94.2  PLT 397 412* 392 405* 405*   Cardiac Enzymes: No results for input(s): "CKTOTAL", "CKMB",  "CKMBINDEX", "TROPONINI" in the last 168 hours. BNP: BNP (last 3 results) No results for input(s): "BNP" in the last 8760 hours.  ProBNP (last 3 results) No results for input(s): "PROBNP" in the last 8760 hours.  CBG: Recent Labs  Lab 12/15/23 1150 12/15/23 1644 12/15/23 2131 12/16/23 0628 12/16/23 1139  GLUCAP 87 140* 102* 115* 104*    Signed:  Verlie Glisson MD   [facility] 12/17/2023, 10:59 AM

## 2023-12-17 NOTE — Progress Notes (Signed)
 Discharge summary packet/necessary documents provided to PT[facility],Pt d/c to [facility] as ordered, He remains alert/oriented in no apparent distress.

## 2023-12-17 NOTE — Progress Notes (Signed)
 No overall complaints patient is stable for discharge if authorization comes through and has been approved I have discussed with TOC and have asked physical therapy to work with him this morning and work on transfers and that way we can make a decision about placement Hopefully he can discharge to rehab he has a high risk for wound breakdown and worsening contractures if he does not   No charge  Abbie Hock, MD [facility] 9:10 AM

## 2023-12-17 NOTE — Progress Notes (Signed)
 Report given to [facility] LPN at [facility]. All questions and concerns were fully answered.

## 2023-12-17 NOTE — Progress Notes (Signed)
 Occupational Therapy Treatment Patient Details Name: EVEN POLIO MRN: [id number] DOB: 08-07-1935 Today's Date: 12/17/2023   History of present illness 88 yo male presents to ED on 5/6 with R foot wound. Dx of R 5th ray osteomyelitis, s/p R partial 5th ray amputation 5/7. PMHx includes: arthritis, R bundle branch block and bradycardia, HTN, PVD, CVA w/ R side weakness, preDM.   OT comments  Patient supine in bed, c/o R foot pain and RN providing medication.  Requires mod assist +2 for bed mobility, and mod-max assist for dynamic ADLs at EOB due to posterior lean.  Poor recall and adherence to R LE NWB precautions, lateral scoot with max assist +2 to recliner.  Noted, pt voicing frustration with decreased mobility stating he "just needs to go die" but reinforced  educated on mobility timeline with surgery healing and adaptive mobility post surgery and pt responded well. Continue to recommend <3hrs/day inpatient setting at dc.       If plan is discharge home, recommend the following:  Two people to help with walking and/or transfers;A lot of help with bathing/dressing/bathroom;Assistance with cooking/housework;Assist for transportation;Help with stairs or ramp for entrance;Direct supervision/assist for financial management;Direct supervision/assist for medications management   Equipment Recommendations  Other (comment);Wheelchair (measurements OT);Wheelchair cushion (measurements OT) (drop arm commode)    Recommendations for Other Services      Precautions / Restrictions Precautions Precautions: Fall Recall of Precautions/Restrictions: Impaired Precaution/Restrictions Comments: R chronic hemi, has AFO for shoe but needs post op shoe now Required Braces or Orthoses: Other Brace Other Brace: post op shoe R LE Restrictions Weight Bearing Restrictions Per Provider Order: Yes RLE Weight Bearing Per Provider Order: Non weight bearing Other Position/Activity Restrictions: poor memory of  precautions and no adherence with mobility       Mobility Bed Mobility Overal bed mobility: Needs Assistance Bed Mobility: Supine to Sit     Supine to sit: Mod assist, +2 for safety/equipment     General bed mobility comments: pt able to assist with LE with grossly minA, then needing mod-maxA at trunk to maintain sitting balance due to posterior lean. modA to scoot to EOB    Transfers Overall transfer level: Needs assistance Equipment used: None Transfers: Bed to chair/wheelchair/BSC            Lateral/Scoot Transfers: Max assist, +2 physical assistance General transfer comment: pt completed x2 lateral scoots along EOB with use of LLE, LUE, and assist of +2 using bed pad. pt needing maxA to not push through RLE. then maxA of 2 using same technique to scoot laterally to chair     Balance Overall balance assessment: Needs assistance Sitting-balance support: No upper extremity supported, Feet supported Sitting balance-Leahy Scale: Poor Sitting balance - Comments: mulptiple posterior LOB, especially with movement of RLE. maxA to maintain Postural control: Posterior lean                                 ADL either performed or assessed with clinical judgement   ADL Overall ADL's : Needs assistance/impaired     Grooming: Minimal assistance;Sitting           Upper Body Dressing : Moderate assistance;Maximal assistance;Sitting Upper Body Dressing Details (indicate cue type and reason): poor sitting balance, less assist if sitting supported vs EOB Lower Body Dressing: Total assistance;+2 for physical assistance;Sitting/lateral leans   Toilet Transfer: Maximal assistance;+2 for physical assistance Toilet Transfer Details (indicate cue type  and reason): lateral scoot to EOB         Functional mobility during ADLs: Moderate assistance;Maximal assistance;+2 for physical assistance      Extremity/Trunk Assessment              Vision        Perception     Praxis     Communication Communication Communication: Impaired Factors Affecting Communication: Hearing impaired;Reduced clarity of speech   Cognition Arousal: Alert Behavior During Therapy: WFL for tasks assessed/performed Cognition: No family/caregiver present to determine baseline             OT - Cognition Comments: pt with decreased ability to attend and follow multiple step commands. poor recall of precautions and not adhering functionally without max assist                 Following commands: Impaired Following commands impaired: Follows one step commands inconsistently, Follows one step commands with increased time, Follows multi-step commands inconsistently      Cueing   Cueing Techniques: Verbal cues, Visual cues, Gestural cues  Exercises      Shoulder Instructions       General Comments VSS on RA    Pertinent Vitals/ Pain       Pain Assessment Pain Assessment: 0-10 Pain Score: 8  Faces Pain Scale: Hurts even more Pain Location: R foot Pain Descriptors / Indicators: Discomfort, Operative site guarding Pain Intervention(s): Limited activity within patient's tolerance, Monitored during session, Repositioned  Home Living                                          Prior Functioning/Environment              Frequency  Min 2X/week        Progress Toward Goals  OT Goals(current goals can now be found in the care plan section)  Progress towards OT goals: Progressing toward goals  Acute Rehab OT Goals Patient Stated Goal: less pain OT Goal Formulation: With patient Time For Goal Achievement: 12/27/23 Potential to Achieve Goals: Fair  Plan      Co-evaluation    PT/OT/SLP Co-Evaluation/Treatment: Yes Reason for Co-Treatment: For patient/therapist safety;To address functional/ADL transfers PT goals addressed during session: Mobility/safety with mobility;Balance OT goals addressed during session: ADL's  and self-care      AM-PAC OT "6 Clicks" Daily Activity     Outcome Measure   Help from another person eating meals?: A Little Help from another person taking care of personal grooming?: A Little Help from another person toileting, which includes using toliet, bedpan, or urinal?: Total Help from another person bathing (including washing, rinsing, drying)?: A Lot Help from another person to put on and taking off regular upper body clothing?: A Lot Help from another person to put on and taking off regular lower body clothing?: Total 6 Click Score: 12    End of Session Equipment Utilized During Treatment: Gait belt  OT Visit Diagnosis: Other abnormalities of gait and mobility (R26.89);Muscle weakness (generalized) (M62.81);Pain Pain - Right/Left: Right Pain - part of body: Ankle and joints of foot   Activity Tolerance Patient limited by pain   Patient Left with call bell/phone within reach;in chair;with chair alarm set   Nurse Communication Mobility status        Time: 1610-9604 OT Time Calculation (min): 27 min  Charges: OT General Charges $OT Visit:  1 Visit OT Treatments $Self Care/Home Management : 8-22 mins  Bary Boss, OT Acute Rehabilitation Services Office [phone] Secure Chat Preferred    Fredrich Jefferson 12/17/2023, 10:11 AM

## 2023-12-17 NOTE — Progress Notes (Signed)
 Physical Therapy Treatment Patient Details Name: [name] MRN: [id number] DOB: 1936/02/07 Today's Date: 12/17/2023   History of Present Illness 88 yo male presents to ED on 5/6 with R foot wound. Dx of R 5th ray osteomyelitis, s/p R partial 5th ray amputation 5/7. PMHx includes: arthritis, R bundle branch block and bradycardia, HTN, PVD, CVA w/ R side weakness, preDM.    PT Comments  Pt agreeable to session despite reports of pain. The pt demos improved ability to manage LE movement to complete bed mobility, but continues to require mod-maxA to manage trunk and seated balance when sitting EOB, especially with any movement of RLE when sitting EOB. The pt completed x2 lateral scoots along EOB with use of LLE, LUE, and assist of +2 using bed pad, demos almost no ability to maintain RLE NWB despite max cues and assist under R foot. Pt then tolerated lateral scoot to chair with same technique and again maxA of 2. Of note, pt very down regarding his current mobility, stating multiple times that he "just needs to go die." However, the pt responded well to encouragement and education regarding the timeline of healing from surgery and recovery of mobility. Pt currently dependent on significant assist from therapists to transfer, recommend use of hoyer lift with RN staff. Would benefit from continued inpatient therapies to improve seated balance, strength in LLE, LUE, and ability to maintain wb precautions with transfer.    If plan is discharge home, recommend the following: Two people to help with walking and/or transfers;A lot of help with bathing/dressing/bathroom;Assistance with cooking/housework;Assistance with feeding;Direct supervision/assist for medications management;Direct supervision/assist for financial management;Assist for transportation;Help with stairs or ramp for entrance;Supervision due to cognitive status   Can travel by private vehicle     No  Equipment Recommendations  Wheelchair  (measurements PT);Wheelchair cushion (measurements PT);Hoyer lift;Hospital bed (if going home)    Recommendations for Other Services       Precautions / Restrictions Precautions Precautions: Fall Recall of Precautions/Restrictions: Impaired Precaution/Restrictions Comments: R chronic hemi, has AFO for shoe but needs post op shoe now Required Braces or Orthoses: Other Brace Other Brace: post op shoe R LE Restrictions Weight Bearing Restrictions Per Provider Order: Yes RLE Weight Bearing Per Provider Order: Non weight bearing Other Position/Activity Restrictions: poor memory of precautions and no adherence with mobility     Mobility  Bed Mobility Overal bed mobility: Needs Assistance Bed Mobility: Supine to Sit     Supine to sit: Mod assist, +2 for safety/equipment     General bed mobility comments: pt able to assist with LE with grossly minA, then needing mod-maxA at trunk to maintain sitting balance due to posterior lean. modA to scoot to EOB    Transfers Overall transfer level: Needs assistance Equipment used: None Transfers: Bed to chair/wheelchair/BSC            Lateral/Scoot Transfers: Max assist, +2 physical assistance General transfer comment: pt completed x2 lateral scoots along EOB with use of LLE, LUE, and assist of +2 using bed pad. pt needing maxA to not push through RLE. then maxA of 2 using same technique to scoot laterally to chair    Ambulation/Gait               General Gait Details: pt unable      Balance Overall balance assessment: Needs assistance Sitting-balance support: No upper extremity supported, Feet supported Sitting balance-Leahy Scale: Poor Sitting balance - Comments: mulptiple posterior LOB, especially with movement of RLE. maxA  to maintain Postural control: Posterior lean                                  Communication Communication Communication: Impaired Factors Affecting Communication: Hearing  impaired;Reduced clarity of speech  Cognition Arousal: Alert Behavior During Therapy: WFL for tasks assessed/performed   PT - Cognitive impairments: Memory, Attention, Initiation, Sequencing, Problem solving, Safety/Judgement                       PT - Cognition Comments: HOH, pt with no recall of wt bearing precautions and not able to apply to mobility despite max cues. cues for sequencing all mobility. pt also seemingly very depressed about situation, stating multiple times that he "just needs to go die" Following commands: Impaired Following commands impaired: Follows one step commands inconsistently, Follows one step commands with increased time, Follows multi-step commands inconsistently    Cueing Cueing Techniques: Verbal cues, Visual cues, Gestural cues  Exercises General Exercises - Lower Extremity Long Arc Quad: AROM, Right, 5 reps, Seated    General Comments General comments (skin integrity, edema, etc.): VSS on RA      Pertinent Vitals/Pain Pain Assessment Pain Assessment: 0-10 Pain Score: 8  Faces Pain Scale: Hurts even more Pain Location: R foot Pain Descriptors / Indicators: Discomfort, Operative site guarding Pain Intervention(s): Limited activity within patient's tolerance, Monitored during session, Repositioned, RN gave pain meds during session     PT Goals (current goals can now be found in the care plan section) Acute Rehab PT Goals Patient Stated Goal: improve mobility PT Goal Formulation: With patient Time For Goal Achievement: 12/27/23 Potential to Achieve Goals: Good Progress towards PT goals: Progressing toward goals    Frequency    Min 2X/week      PT Plan      Co-evaluation PT/OT/SLP Co-Evaluation/Treatment: Yes Reason for Co-Treatment: For patient/therapist safety;To address functional/ADL transfers PT goals addressed during session: Mobility/safety with mobility;Balance OT goals addressed during session: ADL's and self-care       AM-PAC PT "6 Clicks" Mobility   Outcome Measure  Help needed turning from your back to your side while in a flat bed without using bedrails?: A Lot Help needed moving from lying on your back to sitting on the side of a flat bed without using bedrails?: A Lot Help needed moving to and from a bed to a chair (including a wheelchair)?: Total Help needed standing up from a chair using your arms (e.g., wheelchair or bedside chair)?: Total Help needed to walk in hospital room?: Total Help needed climbing 3-5 steps with a railing? : Total 6 Click Score: 8    End of Session Equipment Utilized During Treatment: Gait belt Activity Tolerance: Patient tolerated treatment well Patient left: in chair;with call bell/phone within reach;with chair alarm set Nurse Communication: Mobility status;Need for lift equipment PT Visit Diagnosis: Other abnormalities of gait and mobility (R26.89);Muscle weakness (generalized) (M62.81)     Time: 0347-4259 PT Time Calculation (min) (ACUTE ONLY): 27 min  Charges:    $Therapeutic Exercise: 8-22 mins PT General Charges $$ ACUTE PT VISIT: 1 Visit                     Barnabas Booth, PT, DPT   Acute Rehabilitation Department Office [phone] Secure Chat Communication Preferred   Lona Rist 12/17/2023, 9:29 AM

## 2023-12-17 NOTE — TOC Progression Note (Addendum)
 Transition of Care [facility]) - Progression Note    Patient Details  Name: CHIKE MAZZOTTI MRN: [id number] Date of Birth: 1936/02/02  [facility] CM/SW Contact  Elspeth Hals, LCSW Phone Number: 12/17/2023, 9:54 AM  Clinical Narrative:   Per MD, P2P completed, Navi asking for new PT note.  New Note has been uploaded.   1045: SNF auth approved: [id number], 3 days: 5/13-5/15.  CSW confirmed with [facility] that they can receive pt today. [facility] asking for confirmation that pt not using CPAP--CSW confirmed with MD that pt is not using CPAP, [facility] updated.        Expected Discharge Plan and Services         Expected Discharge Date: 12/16/23                                     Social Determinants of Health (SDOH) Interventions SDOH Screenings   Food Insecurity: No Food Insecurity (12/10/2023)  Housing: Unknown (12/10/2023)  Transportation Needs: No Transportation Needs (12/10/2023)  Utilities: Not At Risk (12/10/2023)  Alcohol Screen: Low Risk  (05/29/2023)  Depression (PHQ2-9): Low Risk  (08/28/2023)  Financial Resource Strain: Low Risk  (05/29/2023)  Physical Activity: Insufficiently Active (05/29/2023)  Social Connections: Socially Isolated (12/10/2023)  Stress: No Stress Concern Present (05/29/2023)  Tobacco Use: Low Risk  (12/11/2023)  Health Literacy: Inadequate Health Literacy (05/29/2023)    Readmission Risk Interventions     No data to display

## 2023-12-18 DIAGNOSIS — R7303 Prediabetes: Secondary | ICD-10-CM | POA: Diagnosis not present

## 2023-12-18 DIAGNOSIS — M869 Osteomyelitis, unspecified: Secondary | ICD-10-CM | POA: Diagnosis not present

## 2023-12-18 DIAGNOSIS — M1611 Unilateral primary osteoarthritis, right hip: Secondary | ICD-10-CM | POA: Diagnosis not present

## 2023-12-18 DIAGNOSIS — G8191 Hemiplegia, unspecified affecting right dominant side: Secondary | ICD-10-CM | POA: Diagnosis not present

## 2023-12-18 DIAGNOSIS — I4891 Unspecified atrial fibrillation: Secondary | ICD-10-CM | POA: Diagnosis not present

## 2023-12-20 DIAGNOSIS — Z4789 Encounter for other orthopedic aftercare: Secondary | ICD-10-CM | POA: Diagnosis not present

## 2023-12-20 DIAGNOSIS — M6281 Muscle weakness (generalized): Secondary | ICD-10-CM | POA: Diagnosis not present

## 2023-12-20 DIAGNOSIS — R2689 Other abnormalities of gait and mobility: Secondary | ICD-10-CM | POA: Diagnosis not present

## 2023-12-20 DIAGNOSIS — M869 Osteomyelitis, unspecified: Secondary | ICD-10-CM | POA: Diagnosis not present

## 2023-12-20 DIAGNOSIS — G8191 Hemiplegia, unspecified affecting right dominant side: Secondary | ICD-10-CM | POA: Diagnosis not present

## 2023-12-20 DIAGNOSIS — I69951 Hemiplegia and hemiparesis following unspecified cerebrovascular disease affecting right dominant side: Secondary | ICD-10-CM | POA: Diagnosis not present

## 2023-12-20 DIAGNOSIS — M1611 Unilateral primary osteoarthritis, right hip: Secondary | ICD-10-CM | POA: Diagnosis not present

## 2023-12-20 DIAGNOSIS — R7303 Prediabetes: Secondary | ICD-10-CM | POA: Diagnosis not present

## 2023-12-20 DIAGNOSIS — M6259 Muscle wasting and atrophy, not elsewhere classified, multiple sites: Secondary | ICD-10-CM | POA: Diagnosis not present

## 2023-12-20 DIAGNOSIS — I4891 Unspecified atrial fibrillation: Secondary | ICD-10-CM | POA: Diagnosis not present

## 2023-12-20 DIAGNOSIS — E441 Mild protein-calorie malnutrition: Secondary | ICD-10-CM | POA: Diagnosis not present

## 2023-12-20 DIAGNOSIS — Z741 Need for assistance with personal care: Secondary | ICD-10-CM | POA: Diagnosis not present

## 2023-12-23 DIAGNOSIS — R7303 Prediabetes: Secondary | ICD-10-CM | POA: Diagnosis not present

## 2023-12-23 DIAGNOSIS — Z7902 Long term (current) use of antithrombotics/antiplatelets: Secondary | ICD-10-CM | POA: Diagnosis not present

## 2023-12-23 DIAGNOSIS — M1611 Unilateral primary osteoarthritis, right hip: Secondary | ICD-10-CM | POA: Diagnosis not present

## 2023-12-23 DIAGNOSIS — Z7982 Long term (current) use of aspirin: Secondary | ICD-10-CM | POA: Diagnosis not present

## 2023-12-23 DIAGNOSIS — I4891 Unspecified atrial fibrillation: Secondary | ICD-10-CM | POA: Diagnosis not present

## 2023-12-23 DIAGNOSIS — Z951 Presence of aortocoronary bypass graft: Secondary | ICD-10-CM | POA: Diagnosis not present

## 2023-12-24 ENCOUNTER — Ambulatory Visit (INDEPENDENT_AMBULATORY_CARE_PROVIDER_SITE_OTHER): Admitting: Podiatry

## 2023-12-24 DIAGNOSIS — L97512 Non-pressure chronic ulcer of other part of right foot with fat layer exposed: Secondary | ICD-10-CM

## 2023-12-24 DIAGNOSIS — M869 Osteomyelitis, unspecified: Secondary | ICD-10-CM

## 2023-12-24 DIAGNOSIS — M86171 Other acute osteomyelitis, right ankle and foot: Secondary | ICD-10-CM

## 2023-12-24 NOTE — Progress Notes (Signed)
  Subjective:  Patient ID: [name], male    DOB: Feb 16, 1936,  MRN: [id number]   DOS: 12/11/2023 Procedure: 1.  Partial fifth ray amputation with excision of ulceration, right foot 2.  Application dissolvable antibiotic beads, right foot 3.  Application dermal allograft, 38 cm, right foot  88 y.o. male seen for post op check.   Pt coming in for first post op check in office 2 weeks post op.  Reports they have not been changing the dressing at the nursing home as was recommended upon discharge from the hospital.  He is with his family member at this appointment.  He does report some pulling sensation in his right leg from time to time.  Has been compliant with nonweightbearing using wheelchair to get around.  Has postop shoe in place.  Review of Systems: Negative except as noted in the HPI. Denies N/V/F/Ch.   Objective:   Constitutional Well developed. Well nourished.  Vascular Foot warm and well perfused. Capillary refill normal to all digits.   No calf pain with palpation  Neurologic Normal speech. Oriented to person, place, and time. Epicritic sensation diminished to right foot  Dermatologic Amp site viable without significant necrosis or dehiscence though there is some dehiscence at the apex of the amputation site with some antibiotic beads draining out through this opening.  Overall the incision is well coapted and dry improved from prior with decreased maceration     Orthopedic: Status post right partial fifth ray amputation   Radiographs: Amputation of fifth ray at the proximal aspect of the fifth metatarsal  Pathology: A. TOE, RIGHT FIFTH, AMPUTATION:  Ulceration with acute inflammation and acute osteomyelitis.   B. BONE, RIGHT FIFTH, PROXIMAL, MARGIN:  Benign bone and connective tissue.  Negative for osteomyelitis.   Micro:  Staph aureus   Assessment:   Osteomyelitis of right fifth ray status post right partial fifth ray amputation  Plan:  Patient was  evaluated and treated and all questions answered.  2 weeks s/p partial fifth ray amputation right foot -Progressing as expected postoperatively.  Small dehiscence at the apex of the incision likely due to poor vascularity/necrosis -Recommend daily dressing changes as below and provided orders for the skilled nursing facility to do this. -XR: Expected postop changes -WB Status: Nonweightbearing in soft dressing while in bed and postop shoe while up with PT -Sutures: Remain intact. -Medications/ABX:   Finish course of doxycycline  no further antibiotics -Dressing:   right foot dressing every  day starting 5/21 at SNF. Cleanse incision site with betadine, apply non adherent gauze or silver  alginate dressing, then apply 4x4 guaze, abd pad, kerlix and ace wrap.  - F/u Plan: Follow-up in 2 weeks        Maridee Shoemaker, DPM [facility] / [facility]

## 2023-12-25 DIAGNOSIS — R7303 Prediabetes: Secondary | ICD-10-CM | POA: Diagnosis not present

## 2023-12-25 DIAGNOSIS — G8191 Hemiplegia, unspecified affecting right dominant side: Secondary | ICD-10-CM | POA: Diagnosis not present

## 2023-12-25 DIAGNOSIS — M1611 Unilateral primary osteoarthritis, right hip: Secondary | ICD-10-CM | POA: Diagnosis not present

## 2023-12-25 DIAGNOSIS — M869 Osteomyelitis, unspecified: Secondary | ICD-10-CM | POA: Diagnosis not present

## 2023-12-25 DIAGNOSIS — I4891 Unspecified atrial fibrillation: Secondary | ICD-10-CM | POA: Diagnosis not present

## 2023-12-27 DIAGNOSIS — R7303 Prediabetes: Secondary | ICD-10-CM | POA: Diagnosis not present

## 2023-12-27 DIAGNOSIS — M1611 Unilateral primary osteoarthritis, right hip: Secondary | ICD-10-CM | POA: Diagnosis not present

## 2023-12-27 DIAGNOSIS — G8191 Hemiplegia, unspecified affecting right dominant side: Secondary | ICD-10-CM | POA: Diagnosis not present

## 2023-12-27 DIAGNOSIS — M869 Osteomyelitis, unspecified: Secondary | ICD-10-CM | POA: Diagnosis not present

## 2023-12-27 DIAGNOSIS — I4891 Unspecified atrial fibrillation: Secondary | ICD-10-CM | POA: Diagnosis not present

## 2023-12-31 DIAGNOSIS — E441 Mild protein-calorie malnutrition: Secondary | ICD-10-CM | POA: Diagnosis not present

## 2023-12-31 DIAGNOSIS — Z4789 Encounter for other orthopedic aftercare: Secondary | ICD-10-CM | POA: Diagnosis not present

## 2023-12-31 DIAGNOSIS — T8189XA Other complications of procedures, not elsewhere classified, initial encounter: Secondary | ICD-10-CM | POA: Diagnosis not present

## 2023-12-31 DIAGNOSIS — M6281 Muscle weakness (generalized): Secondary | ICD-10-CM | POA: Diagnosis not present

## 2023-12-31 DIAGNOSIS — I69951 Hemiplegia and hemiparesis following unspecified cerebrovascular disease affecting right dominant side: Secondary | ICD-10-CM | POA: Diagnosis not present

## 2023-12-31 DIAGNOSIS — R2689 Other abnormalities of gait and mobility: Secondary | ICD-10-CM | POA: Diagnosis not present

## 2023-12-31 DIAGNOSIS — Z741 Need for assistance with personal care: Secondary | ICD-10-CM | POA: Diagnosis not present

## 2023-12-31 DIAGNOSIS — M6259 Muscle wasting and atrophy, not elsewhere classified, multiple sites: Secondary | ICD-10-CM | POA: Diagnosis not present

## 2024-01-01 DIAGNOSIS — I4891 Unspecified atrial fibrillation: Secondary | ICD-10-CM | POA: Diagnosis not present

## 2024-01-01 DIAGNOSIS — Z7982 Long term (current) use of aspirin: Secondary | ICD-10-CM | POA: Diagnosis not present

## 2024-01-01 DIAGNOSIS — M869 Osteomyelitis, unspecified: Secondary | ICD-10-CM | POA: Diagnosis not present

## 2024-01-03 DIAGNOSIS — E441 Mild protein-calorie malnutrition: Secondary | ICD-10-CM | POA: Diagnosis not present

## 2024-01-03 DIAGNOSIS — M6259 Muscle wasting and atrophy, not elsewhere classified, multiple sites: Secondary | ICD-10-CM | POA: Diagnosis not present

## 2024-01-03 DIAGNOSIS — Z741 Need for assistance with personal care: Secondary | ICD-10-CM | POA: Diagnosis not present

## 2024-01-03 DIAGNOSIS — R2689 Other abnormalities of gait and mobility: Secondary | ICD-10-CM | POA: Diagnosis not present

## 2024-01-03 DIAGNOSIS — Z4789 Encounter for other orthopedic aftercare: Secondary | ICD-10-CM | POA: Diagnosis not present

## 2024-01-03 DIAGNOSIS — I69951 Hemiplegia and hemiparesis following unspecified cerebrovascular disease affecting right dominant side: Secondary | ICD-10-CM | POA: Diagnosis not present

## 2024-01-03 DIAGNOSIS — M6281 Muscle weakness (generalized): Secondary | ICD-10-CM | POA: Diagnosis not present

## 2024-01-07 ENCOUNTER — Ambulatory Visit (INDEPENDENT_AMBULATORY_CARE_PROVIDER_SITE_OTHER): Admitting: Podiatry

## 2024-01-07 DIAGNOSIS — Z9889 Other specified postprocedural states: Secondary | ICD-10-CM

## 2024-01-07 DIAGNOSIS — I69951 Hemiplegia and hemiparesis following unspecified cerebrovascular disease affecting right dominant side: Secondary | ICD-10-CM | POA: Diagnosis not present

## 2024-01-07 DIAGNOSIS — M869 Osteomyelitis, unspecified: Secondary | ICD-10-CM

## 2024-01-07 DIAGNOSIS — G8191 Hemiplegia, unspecified affecting right dominant side: Secondary | ICD-10-CM | POA: Diagnosis not present

## 2024-01-07 DIAGNOSIS — E441 Mild protein-calorie malnutrition: Secondary | ICD-10-CM | POA: Diagnosis not present

## 2024-01-07 DIAGNOSIS — R2689 Other abnormalities of gait and mobility: Secondary | ICD-10-CM | POA: Diagnosis not present

## 2024-01-07 DIAGNOSIS — R7303 Prediabetes: Secondary | ICD-10-CM | POA: Diagnosis not present

## 2024-01-07 DIAGNOSIS — Z741 Need for assistance with personal care: Secondary | ICD-10-CM | POA: Diagnosis not present

## 2024-01-07 DIAGNOSIS — M6281 Muscle weakness (generalized): Secondary | ICD-10-CM | POA: Diagnosis not present

## 2024-01-07 DIAGNOSIS — Z4789 Encounter for other orthopedic aftercare: Secondary | ICD-10-CM | POA: Diagnosis not present

## 2024-01-07 DIAGNOSIS — M6259 Muscle wasting and atrophy, not elsewhere classified, multiple sites: Secondary | ICD-10-CM | POA: Diagnosis not present

## 2024-01-07 DIAGNOSIS — I739 Peripheral vascular disease, unspecified: Secondary | ICD-10-CM

## 2024-01-07 DIAGNOSIS — I4891 Unspecified atrial fibrillation: Secondary | ICD-10-CM | POA: Diagnosis not present

## 2024-01-07 DIAGNOSIS — M1611 Unilateral primary osteoarthritis, right hip: Secondary | ICD-10-CM | POA: Diagnosis not present

## 2024-01-07 NOTE — Progress Notes (Signed)
  Subjective:  Patient ID: [name], male    DOB: Jun 27, 1936,  MRN: [id number]   DOS: 12/11/2023 Procedure: 1.  Partial fifth ray amputation with excision of ulceration, right foot 2.  Application dissolvable antibiotic beads, right foot 3.  Application dermal allograft, 38 cm, right foot  88 y.o. male seen for post op check.   Pt coming in for first post op check in office 4 weeks post op.   Patient has been getting every day Betadine with dry dressing changes to the right foot.  Denies drainage denies pain.  Has remained nonweightbearing in postop shoe to the right foot.  Still has sutures and staples intact  Review of Systems: Negative except as noted in the HPI. Denies N/V/F/Ch.   Objective:   Constitutional Well developed. Well nourished.  Vascular Foot warm and well perfused. Capillary refill normal to all digits.   No calf pain with palpation  Neurologic Normal speech. Oriented to person, place, and time. Epicritic sensation diminished to right foot  Dermatologic Amp site with some eschar and dehiscence superficially due to necrosis especially at the apex of the lateral flap as well as the plantar aspect of the incision.  No evidence of infection no erythema or malodor present.   Orthopedic: Status post right partial fifth ray amputation   Radiographs: Amputation of fifth ray at the proximal aspect of the fifth metatarsal  Pathology: A. TOE, RIGHT FIFTH, AMPUTATION:  Ulceration with acute inflammation and acute osteomyelitis.   B. BONE, RIGHT FIFTH, PROXIMAL, MARGIN:  Benign bone and connective tissue.  Negative for osteomyelitis.   Micro:  Staph aureus   Assessment:   Osteomyelitis of right fifth ray status post right partial fifth ray amputation  Plan:  Patient was evaluated and treated and all questions answered.  4 weeks s/p partial fifth ray amputation right foot -Progressing as expected postoperatively  -Recommend Monday Wednesday Friday dressing  changes as below   -XR: Expected postop changes -WB Status: Nonweightbearing in soft dressing while in bed and postop shoe while up with PT -Sutures: Sutures and staples removed in total today -Medications/ABX:   s/p course of post op doxycycline  no further antibiotics indicated -Dressing:   right foot dressing Wednesday Friday cleanse incision site with betadine, apply non adherent gauze or silver  alginate dressing, then apply 4x4 guaze, abd pad, kerlix and ace wrap.  - F/u Plan: Follow-up in 2 weeks for wound check        Maridee Shoemaker, DPM [facility] / [facility]

## 2024-01-08 DIAGNOSIS — Z7982 Long term (current) use of aspirin: Secondary | ICD-10-CM | POA: Diagnosis not present

## 2024-01-08 DIAGNOSIS — I4891 Unspecified atrial fibrillation: Secondary | ICD-10-CM | POA: Diagnosis not present

## 2024-01-08 DIAGNOSIS — M869 Osteomyelitis, unspecified: Secondary | ICD-10-CM | POA: Diagnosis not present

## 2024-01-09 DIAGNOSIS — Z7982 Long term (current) use of aspirin: Secondary | ICD-10-CM | POA: Diagnosis not present

## 2024-01-09 DIAGNOSIS — I4891 Unspecified atrial fibrillation: Secondary | ICD-10-CM | POA: Diagnosis not present

## 2024-01-09 DIAGNOSIS — M869 Osteomyelitis, unspecified: Secondary | ICD-10-CM | POA: Diagnosis not present

## 2024-01-12 DIAGNOSIS — I480 Paroxysmal atrial fibrillation: Secondary | ICD-10-CM | POA: Diagnosis not present

## 2024-01-12 DIAGNOSIS — Z4781 Encounter for orthopedic aftercare following surgical amputation: Secondary | ICD-10-CM | POA: Diagnosis not present

## 2024-01-12 DIAGNOSIS — I739 Peripheral vascular disease, unspecified: Secondary | ICD-10-CM | POA: Diagnosis not present

## 2024-01-12 DIAGNOSIS — E43 Unspecified severe protein-calorie malnutrition: Secondary | ICD-10-CM | POA: Diagnosis not present

## 2024-01-12 DIAGNOSIS — M65171 Other infective (teno)synovitis, right ankle and foot: Secondary | ICD-10-CM | POA: Diagnosis not present

## 2024-01-12 DIAGNOSIS — Z89421 Acquired absence of other right toe(s): Secondary | ICD-10-CM | POA: Diagnosis not present

## 2024-01-12 DIAGNOSIS — I69351 Hemiplegia and hemiparesis following cerebral infarction affecting right dominant side: Secondary | ICD-10-CM | POA: Diagnosis not present

## 2024-01-12 DIAGNOSIS — I272 Pulmonary hypertension, unspecified: Secondary | ICD-10-CM | POA: Diagnosis not present

## 2024-01-12 DIAGNOSIS — M86171 Other acute osteomyelitis, right ankle and foot: Secondary | ICD-10-CM | POA: Diagnosis not present

## 2024-01-13 ENCOUNTER — Telehealth: Payer: Self-pay

## 2024-01-13 NOTE — Transitions of Care (Post Inpatient/ED Visit) (Signed)
   01/13/2024  Name: [name] MRN: [id number] DOB: 06-22-1936  Today's TOC FU Call Status: Today's TOC FU Call Status:: Unsuccessful Call (1st Attempt) Unsuccessful Call (1st Attempt) Date: 01/13/24  Attempted to reach the patient regarding the most recent Inpatient/ED visit.  Follow Up Plan: Additional outreach attempts will be made to reach the patient to complete the Transitions of Care (Post Inpatient/ED visit) call.   Signature  Germain Kohler, CMA (AAMA)  [facility] [phone]

## 2024-01-17 DIAGNOSIS — Z4781 Encounter for orthopedic aftercare following surgical amputation: Secondary | ICD-10-CM | POA: Diagnosis not present

## 2024-01-17 DIAGNOSIS — Z89421 Acquired absence of other right toe(s): Secondary | ICD-10-CM | POA: Diagnosis not present

## 2024-01-17 DIAGNOSIS — M86171 Other acute osteomyelitis, right ankle and foot: Secondary | ICD-10-CM | POA: Diagnosis not present

## 2024-01-17 DIAGNOSIS — I272 Pulmonary hypertension, unspecified: Secondary | ICD-10-CM | POA: Diagnosis not present

## 2024-01-17 DIAGNOSIS — I69351 Hemiplegia and hemiparesis following cerebral infarction affecting right dominant side: Secondary | ICD-10-CM | POA: Diagnosis not present

## 2024-01-17 DIAGNOSIS — M65171 Other infective (teno)synovitis, right ankle and foot: Secondary | ICD-10-CM | POA: Diagnosis not present

## 2024-01-17 DIAGNOSIS — I480 Paroxysmal atrial fibrillation: Secondary | ICD-10-CM | POA: Diagnosis not present

## 2024-01-17 DIAGNOSIS — E43 Unspecified severe protein-calorie malnutrition: Secondary | ICD-10-CM | POA: Diagnosis not present

## 2024-01-17 DIAGNOSIS — I739 Peripheral vascular disease, unspecified: Secondary | ICD-10-CM | POA: Diagnosis not present

## 2024-01-21 DIAGNOSIS — M65171 Other infective (teno)synovitis, right ankle and foot: Secondary | ICD-10-CM | POA: Diagnosis not present

## 2024-01-21 DIAGNOSIS — E43 Unspecified severe protein-calorie malnutrition: Secondary | ICD-10-CM | POA: Diagnosis not present

## 2024-01-21 DIAGNOSIS — I272 Pulmonary hypertension, unspecified: Secondary | ICD-10-CM | POA: Diagnosis not present

## 2024-01-21 DIAGNOSIS — Z4781 Encounter for orthopedic aftercare following surgical amputation: Secondary | ICD-10-CM | POA: Diagnosis not present

## 2024-01-21 DIAGNOSIS — M86171 Other acute osteomyelitis, right ankle and foot: Secondary | ICD-10-CM | POA: Diagnosis not present

## 2024-01-21 DIAGNOSIS — I739 Peripheral vascular disease, unspecified: Secondary | ICD-10-CM | POA: Diagnosis not present

## 2024-01-21 DIAGNOSIS — I69351 Hemiplegia and hemiparesis following cerebral infarction affecting right dominant side: Secondary | ICD-10-CM | POA: Diagnosis not present

## 2024-01-21 DIAGNOSIS — I480 Paroxysmal atrial fibrillation: Secondary | ICD-10-CM | POA: Diagnosis not present

## 2024-01-21 DIAGNOSIS — Z89421 Acquired absence of other right toe(s): Secondary | ICD-10-CM | POA: Diagnosis not present

## 2024-01-23 ENCOUNTER — Ambulatory Visit (INDEPENDENT_AMBULATORY_CARE_PROVIDER_SITE_OTHER): Admitting: Podiatry

## 2024-01-23 DIAGNOSIS — Z9889 Other specified postprocedural states: Secondary | ICD-10-CM

## 2024-01-23 DIAGNOSIS — I739 Peripheral vascular disease, unspecified: Secondary | ICD-10-CM

## 2024-01-23 DIAGNOSIS — M869 Osteomyelitis, unspecified: Secondary | ICD-10-CM

## 2024-01-23 NOTE — Progress Notes (Signed)
  Subjective:  Patient ID: [name], male    DOB: [birth year],  MRN: [id number]   DOS: 12/11/2023 Procedure: 1.  Partial fifth ray amputation with excision of ulceration, right foot 2.  Application dissolvable antibiotic beads, right foot 3.  Application dermal allograft, 38 cm, right foot  88 y.o. male seen for post op check.   Pt coming in for first post op check in office 6 weeks post op.   Patient has been getting once to twice weekly Betadine with dry dressing changes to the right foot.  Denies drainage denies pain.  He has been remained nonweightbearing with aid of a wheelchair.  Does have a postop shoe but did not have it on this morning.  Review of Systems: Negative except as noted in the HPI. Denies N/V/F/Ch.   Objective:   Constitutional Well developed. Well nourished.  Vascular Foot warm and well perfused. Capillary refill normal to all digits.   No calf pain with palpation  Neurologic Normal speech. Oriented to person, place, and time. Epicritic sensation diminished to right foot  Dermatologic Amp site with necrotic and fibrotic tissue along the incision line.  Some necrosis along the distal aspect of the incision at the apex.  No deep dehiscence of the site superficial in nature.   Orthopedic: Status post right partial fifth ray amputation   Radiographs: Amputation of fifth ray at the proximal aspect of the fifth metatarsal  Pathology: A. TOE, RIGHT FIFTH, AMPUTATION:  Ulceration with acute inflammation and acute osteomyelitis.   B. BONE, RIGHT FIFTH, PROXIMAL, MARGIN:  Benign bone and connective tissue.  Negative for osteomyelitis.   Micro:  Staph aureus   Assessment:   Osteomyelitis of right fifth ray status post right partial fifth ray amputation  Plan:  Patient was evaluated and treated and all questions answered.  4 weeks s/p partial fifth ray amputation right foot -Progressing as expected postoperatively, superficial wound along the incision line.   Debrided the necrotic tissue overlying the amputation site with healthy tissue underlying and good bleeding noted upon debridement.  Applied a silver  alginate 4 x 4 gauze Kerlix and Ace wrap dressing.   -Overall continues to improve after debridement, will need ongoing wound care with debridements and dressing changes.  He should leave it wrapped and avoid getting the right foot wet. -Recommend 2X weekly dressing changes as below   -XR: Expected postop changes -WB Status: Nonweightbearing in soft dressing while in bed and postop shoe while up with PT -Sutures: Previously removed -Medications/ABX:   s/p course of post op doxycycline  no further antibiotics indicated -Dressing:   right foot dressing 2x weekly cleanse incision site with betadine, apply non adherent gauze or silver  alginate dressing, then apply 4x4 guaze, abd pad, kerlix and ace wrap.  - F/u Plan: Follow-up in 2 weeks for wound check        Maridee Shoemaker, DPM [facility] / [facility]

## 2024-01-29 DIAGNOSIS — Z4781 Encounter for orthopedic aftercare following surgical amputation: Secondary | ICD-10-CM | POA: Diagnosis not present

## 2024-01-29 DIAGNOSIS — E43 Unspecified severe protein-calorie malnutrition: Secondary | ICD-10-CM | POA: Diagnosis not present

## 2024-01-29 DIAGNOSIS — Z89421 Acquired absence of other right toe(s): Secondary | ICD-10-CM | POA: Diagnosis not present

## 2024-01-29 DIAGNOSIS — I480 Paroxysmal atrial fibrillation: Secondary | ICD-10-CM | POA: Diagnosis not present

## 2024-01-29 DIAGNOSIS — I69351 Hemiplegia and hemiparesis following cerebral infarction affecting right dominant side: Secondary | ICD-10-CM | POA: Diagnosis not present

## 2024-01-29 DIAGNOSIS — M86171 Other acute osteomyelitis, right ankle and foot: Secondary | ICD-10-CM | POA: Diagnosis not present

## 2024-01-29 DIAGNOSIS — I272 Pulmonary hypertension, unspecified: Secondary | ICD-10-CM | POA: Diagnosis not present

## 2024-01-29 DIAGNOSIS — M65171 Other infective (teno)synovitis, right ankle and foot: Secondary | ICD-10-CM | POA: Diagnosis not present

## 2024-01-29 DIAGNOSIS — I739 Peripheral vascular disease, unspecified: Secondary | ICD-10-CM | POA: Diagnosis not present

## 2024-02-04 DIAGNOSIS — M65171 Other infective (teno)synovitis, right ankle and foot: Secondary | ICD-10-CM | POA: Diagnosis not present

## 2024-02-04 DIAGNOSIS — I272 Pulmonary hypertension, unspecified: Secondary | ICD-10-CM | POA: Diagnosis not present

## 2024-02-04 DIAGNOSIS — I480 Paroxysmal atrial fibrillation: Secondary | ICD-10-CM | POA: Diagnosis not present

## 2024-02-04 DIAGNOSIS — M86171 Other acute osteomyelitis, right ankle and foot: Secondary | ICD-10-CM | POA: Diagnosis not present

## 2024-02-04 DIAGNOSIS — I739 Peripheral vascular disease, unspecified: Secondary | ICD-10-CM | POA: Diagnosis not present

## 2024-02-04 DIAGNOSIS — E43 Unspecified severe protein-calorie malnutrition: Secondary | ICD-10-CM | POA: Diagnosis not present

## 2024-02-04 DIAGNOSIS — Z4781 Encounter for orthopedic aftercare following surgical amputation: Secondary | ICD-10-CM | POA: Diagnosis not present

## 2024-02-04 DIAGNOSIS — I69351 Hemiplegia and hemiparesis following cerebral infarction affecting right dominant side: Secondary | ICD-10-CM | POA: Diagnosis not present

## 2024-02-04 DIAGNOSIS — Z89421 Acquired absence of other right toe(s): Secondary | ICD-10-CM | POA: Diagnosis not present

## 2024-02-06 ENCOUNTER — Telehealth: Payer: Self-pay

## 2024-02-06 NOTE — Telephone Encounter (Signed)
 Patient /caregiver called and left a message. They are asking to increase the [facility] nurse visits to 3 x week. I called back to find out why, ask about his wound / wound care. I called [facility] - they have patient down for discharge next week. I left a detailed message for the patient to please call back to clarify / justify the need for the increase. Patient's next appt is 02/13/24

## 2024-02-11 ENCOUNTER — Telehealth: Payer: Self-pay | Admitting: Podiatry

## 2024-02-11 DIAGNOSIS — M86171 Other acute osteomyelitis, right ankle and foot: Secondary | ICD-10-CM | POA: Diagnosis not present

## 2024-02-11 DIAGNOSIS — I480 Paroxysmal atrial fibrillation: Secondary | ICD-10-CM | POA: Diagnosis not present

## 2024-02-11 DIAGNOSIS — Z4781 Encounter for orthopedic aftercare following surgical amputation: Secondary | ICD-10-CM | POA: Diagnosis not present

## 2024-02-11 DIAGNOSIS — I69351 Hemiplegia and hemiparesis following cerebral infarction affecting right dominant side: Secondary | ICD-10-CM | POA: Diagnosis not present

## 2024-02-11 DIAGNOSIS — M65171 Other infective (teno)synovitis, right ankle and foot: Secondary | ICD-10-CM | POA: Diagnosis not present

## 2024-02-11 DIAGNOSIS — I739 Peripheral vascular disease, unspecified: Secondary | ICD-10-CM | POA: Diagnosis not present

## 2024-02-11 DIAGNOSIS — I272 Pulmonary hypertension, unspecified: Secondary | ICD-10-CM | POA: Diagnosis not present

## 2024-02-11 DIAGNOSIS — E43 Unspecified severe protein-calorie malnutrition: Secondary | ICD-10-CM | POA: Diagnosis not present

## 2024-02-11 DIAGNOSIS — Z89421 Acquired absence of other right toe(s): Secondary | ICD-10-CM | POA: Diagnosis not present

## 2024-02-11 NOTE — Telephone Encounter (Signed)
 Darice from [facility] reached out in regards to getting verbal orders so that they can go 2 times a week for the next 4 weeks. She had spoken with the nurse caring for patient and doing his dressing changes and they state the procedure site appears to be slow healing and they have stated a odor is present. Darice states that the caregiver for the patient has not been changing the bandages and that he's only been getting the bandage changes when the home health nurse is out.

## 2024-02-11 NOTE — Telephone Encounter (Signed)
 Darice from [facility] can be contacted at [phone]

## 2024-02-11 NOTE — Telephone Encounter (Signed)
 Thank you Dr. Malvin.

## 2024-02-13 ENCOUNTER — Telehealth: Payer: Self-pay | Admitting: Podiatry

## 2024-02-13 ENCOUNTER — Encounter: Payer: Self-pay | Admitting: Podiatry

## 2024-02-13 ENCOUNTER — Ambulatory Visit (INDEPENDENT_AMBULATORY_CARE_PROVIDER_SITE_OTHER): Admitting: Podiatry

## 2024-02-13 DIAGNOSIS — M869 Osteomyelitis, unspecified: Secondary | ICD-10-CM | POA: Diagnosis not present

## 2024-02-13 DIAGNOSIS — I739 Peripheral vascular disease, unspecified: Secondary | ICD-10-CM

## 2024-02-13 DIAGNOSIS — Z9889 Other specified postprocedural states: Secondary | ICD-10-CM

## 2024-02-13 NOTE — Telephone Encounter (Signed)
 Pt recieved wrong size medical boot and exchanged for a large

## 2024-02-13 NOTE — Progress Notes (Signed)
  Subjective:  Patient ID: [name], male    DOB: Aug 26, 1935,  MRN: [id number]   DOS: 12/11/2023 Procedure: 1.  Partial fifth ray amputation with excision of ulceration, right foot 2.  Application dissolvable antibiotic beads, right foot 3.  Application dermal allograft, 38 cm, right foot  88 y.o. male seen for post op check.   Pt coming in for first post op check in office 8 weeks post op.   Patient has been getting once to twice weekly Betadine with dry dressing changes to the right foot.  Denies drainage denies pain.  He has been remained nonweightbearing with aid of a wheelchair.     Review of Systems: Negative except as noted in the HPI. Denies N/V/F/Ch.   Objective:   Constitutional Well developed. Well nourished.  Vascular Foot warm and well perfused. Capillary refill normal to all digits.   No calf pain with palpation  Neurologic Normal speech. Oriented to person, place, and time. Epicritic sensation diminished to right foot  Dermatologic Amp site with ulceration present along the incision line measures approximately 6 x 3 cm though it is very long and narrow and had a portion of it distally slightly wider however overall with healthy granular tissue bed no necrotic and minimal fibrotic tissues present.    Orthopedic: Status post right partial fifth ray amputation   Radiographs: Amputation of fifth ray at the proximal aspect of the fifth metatarsal  Pathology: A. TOE, RIGHT FIFTH, AMPUTATION:  Ulceration with acute inflammation and acute osteomyelitis.   B. BONE, RIGHT FIFTH, PROXIMAL, MARGIN:  Benign bone and connective tissue.  Negative for osteomyelitis.   Micro:  Staph aureus   Assessment:   Osteomyelitis of right fifth ray status post right partial fifth ray amputation  Plan:  Patient was evaluated and treated and all questions answered.  8 weeks s/p partial fifth ray amputation right foot -Progressing as expected postoperatively, wound present along  the incision line and overlying the amputation site with healthy granular tissue base.  Applied a collagen dressing contact layer followed by 4 x 4 gauze Kerlix Ace wrap. -Overall continues to improve after debridement which was performed excisionally with tissue nipper today to healthy bleeding tissue bed subcutaneous fat tissue layer, will need ongoing wound care with debridements and dressing changes.    -Recommend 3X weekly dressing changes as below   -XR: Expected postop changes -WB Status: Weightbearing as tolerated in cam boot which was dispensed today to the patient -Sutures: Previously removed -Medications/ABX:   s/p course of post op doxycycline  no further antibiotics indicated -Dressing:   right foot dressing 3x weekly cleanse incision site with betadine or wound wash/Vashe, apply alginate style dressing contact layer, then apply 4x4 guaze, abd pad, kerlix and ace wrap.  - F/u Plan: Follow-up in 3 weeks for wound check        Marolyn JULIANNA Honour, DPM [facility] / [facility]

## 2024-02-14 ENCOUNTER — Ambulatory Visit: Payer: Self-pay

## 2024-02-14 NOTE — Telephone Encounter (Signed)
 FYI Only or Action Required?: Action required by provider: Requesting something for pain and swelling until appointment on 7/18, please advise.  Patient was last seen in primary care on 08/28/2023 by Georgina Speaks, FNP.  Called Nurse Triage reporting No chief complaint on file..  Symptoms began 2 months ago.  Interventions attempted: Other: Being seen by a podiatrist.  Symptoms are: unchanged.  Triage Disposition: See PCP Within 2 Weeks  Patient/caregiver understands and will follow disposition?: Yes   Patient saw his podiatrist yesterday who recommended he follow up with his PCP for his swelling. Appointment made for 6/18. Patient would like to know if they could get something to help with the pain and swelling until his appointment. Please advise.     Copied from CRM [id number]. Topic: Clinical - Red Word Triage >> Feb 14, 2024 10:50 AM Wess RAMAN wrote: Red Word that prompted transfer to Nurse Triage: Severe swelling in right leg and foot after amputation of toe. Patient is seeking medication to reduce swelling and fluid from PCP  Preferred Pharmacy: [facility] [phone] - [place], [place] - [address] AT [facility] [address] [place] [address] Phone: [phone] Fax: [phone] Hours: Not open 24 hours     Reason for Disposition  [1] MILD swelling of both ankles (i.e., pedal edema) AND [2] is a chronic symptom (recurrent or ongoing AND present > 4 weeks)    Swelling in 1 ankle for 2 months  Answer Assessment - Initial Assessment Questions 1. ONSET: When did the swelling start? (e.g., minutes, hours, days)     Swelling since amputation of toe on 5/7 2. LOCATION: What part of the leg is swollen?  Are both legs swollen or just one leg?     Right leg 3. SEVERITY: How bad is the swelling? (e.g., localized; mild, moderate, severe)     Mild to moderate  4. REDNESS: Is there redness or signs of infection?     No 5.  PAIN: Is the swelling painful to touch? If Yes, ask: How painful is it?   (Scale 1-10; mild, moderate or severe)     Moderate  6. FEVER: Do you have a fever? If Yes, ask: What is it, how was it measured, and when did it start?      No 8. MEDICAL HISTORY: Do you have a history of blood clots (e.g., DVT), cancer, heart failure, kidney disease, or liver failure?     Amputation of toe on same leg on 5/7 10. OTHER SYMPTOMS: Do you have any other symptoms? (e.g., chest pain, difficulty breathing)       No  Protocols used: Leg Swelling and Edema-A-AH

## 2024-02-15 ENCOUNTER — Encounter: Payer: Self-pay | Admitting: Podiatry

## 2024-02-15 DIAGNOSIS — Z89421 Acquired absence of other right toe(s): Secondary | ICD-10-CM | POA: Diagnosis not present

## 2024-02-15 DIAGNOSIS — M65171 Other infective (teno)synovitis, right ankle and foot: Secondary | ICD-10-CM | POA: Diagnosis not present

## 2024-02-15 DIAGNOSIS — I739 Peripheral vascular disease, unspecified: Secondary | ICD-10-CM | POA: Diagnosis not present

## 2024-02-15 DIAGNOSIS — M86171 Other acute osteomyelitis, right ankle and foot: Secondary | ICD-10-CM | POA: Diagnosis not present

## 2024-02-15 DIAGNOSIS — I480 Paroxysmal atrial fibrillation: Secondary | ICD-10-CM | POA: Diagnosis not present

## 2024-02-15 DIAGNOSIS — I69351 Hemiplegia and hemiparesis following cerebral infarction affecting right dominant side: Secondary | ICD-10-CM | POA: Diagnosis not present

## 2024-02-15 DIAGNOSIS — E43 Unspecified severe protein-calorie malnutrition: Secondary | ICD-10-CM | POA: Diagnosis not present

## 2024-02-15 DIAGNOSIS — I272 Pulmonary hypertension, unspecified: Secondary | ICD-10-CM | POA: Diagnosis not present

## 2024-02-15 DIAGNOSIS — Z4781 Encounter for orthopedic aftercare following surgical amputation: Secondary | ICD-10-CM | POA: Diagnosis not present

## 2024-02-15 MED ORDER — CEPHALEXIN 500 MG PO CAPS: 500.0000 mg | ORAL_CAPSULE | Freq: Four times a day (QID) | ORAL | 0 refills | Status: AC

## 2024-02-15 NOTE — Progress Notes (Signed)
 Received phone call from home health nurse [place] [facility], said his foot adjacent to the wound and streaking up the leg was red and tender and swollen. Did not have posterior knee or calf pain to indicate DVT and is on anticoagulant. Suspect cellulitis RX for Keflex  four times daily sent to pharmacy, discussed with his niece Danny who is his primary caregiver and recommended if not improving in 2 to 3 days with antibiotics to proceed to ER or if he develops fever, chills, nausea, or vomiting.

## 2024-02-18 DIAGNOSIS — Z89421 Acquired absence of other right toe(s): Secondary | ICD-10-CM | POA: Diagnosis not present

## 2024-02-18 DIAGNOSIS — I739 Peripheral vascular disease, unspecified: Secondary | ICD-10-CM | POA: Diagnosis not present

## 2024-02-18 DIAGNOSIS — M86171 Other acute osteomyelitis, right ankle and foot: Secondary | ICD-10-CM | POA: Diagnosis not present

## 2024-02-18 DIAGNOSIS — M65171 Other infective (teno)synovitis, right ankle and foot: Secondary | ICD-10-CM | POA: Diagnosis not present

## 2024-02-18 DIAGNOSIS — E43 Unspecified severe protein-calorie malnutrition: Secondary | ICD-10-CM | POA: Diagnosis not present

## 2024-02-18 DIAGNOSIS — I480 Paroxysmal atrial fibrillation: Secondary | ICD-10-CM | POA: Diagnosis not present

## 2024-02-18 DIAGNOSIS — I272 Pulmonary hypertension, unspecified: Secondary | ICD-10-CM | POA: Diagnosis not present

## 2024-02-18 DIAGNOSIS — Z4781 Encounter for orthopedic aftercare following surgical amputation: Secondary | ICD-10-CM | POA: Diagnosis not present

## 2024-02-18 DIAGNOSIS — I69351 Hemiplegia and hemiparesis following cerebral infarction affecting right dominant side: Secondary | ICD-10-CM | POA: Diagnosis not present

## 2024-02-21 ENCOUNTER — Ambulatory Visit: Payer: Self-pay | Admitting: Family Medicine

## 2024-02-21 ENCOUNTER — Encounter: Payer: Self-pay | Admitting: Family Medicine

## 2024-02-21 VITALS — BP 120/60 | HR 63 | Temp 98.0°F | Ht 69.0 in | Wt 168.8 lb

## 2024-02-21 DIAGNOSIS — Z4781 Encounter for orthopedic aftercare following surgical amputation: Secondary | ICD-10-CM | POA: Diagnosis not present

## 2024-02-21 DIAGNOSIS — I739 Peripheral vascular disease, unspecified: Secondary | ICD-10-CM | POA: Diagnosis not present

## 2024-02-21 DIAGNOSIS — M65171 Other infective (teno)synovitis, right ankle and foot: Secondary | ICD-10-CM | POA: Diagnosis not present

## 2024-02-21 DIAGNOSIS — Z2821 Immunization not carried out because of patient refusal: Secondary | ICD-10-CM | POA: Diagnosis not present

## 2024-02-21 DIAGNOSIS — I69351 Hemiplegia and hemiparesis following cerebral infarction affecting right dominant side: Secondary | ICD-10-CM | POA: Diagnosis not present

## 2024-02-21 DIAGNOSIS — M7989 Other specified soft tissue disorders: Secondary | ICD-10-CM | POA: Diagnosis not present

## 2024-02-21 DIAGNOSIS — M86171 Other acute osteomyelitis, right ankle and foot: Secondary | ICD-10-CM | POA: Diagnosis not present

## 2024-02-21 DIAGNOSIS — Z89421 Acquired absence of other right toe(s): Secondary | ICD-10-CM | POA: Diagnosis not present

## 2024-02-21 DIAGNOSIS — E43 Unspecified severe protein-calorie malnutrition: Secondary | ICD-10-CM | POA: Diagnosis not present

## 2024-02-21 DIAGNOSIS — E782 Mixed hyperlipidemia: Secondary | ICD-10-CM

## 2024-02-21 DIAGNOSIS — R7303 Prediabetes: Secondary | ICD-10-CM | POA: Diagnosis not present

## 2024-02-21 DIAGNOSIS — I480 Paroxysmal atrial fibrillation: Secondary | ICD-10-CM | POA: Diagnosis not present

## 2024-02-21 DIAGNOSIS — I272 Pulmonary hypertension, unspecified: Secondary | ICD-10-CM | POA: Diagnosis not present

## 2024-02-21 LAB — HEMOGLOBIN A1C
Est. average glucose Bld gHb Est-mCnc: 117 mg/dL
Hgb A1c MFr Bld: 5.7 % — ABNORMAL HIGH (ref 4.8–5.6)

## 2024-02-21 LAB — BMP8+EGFR
BUN/Creatinine Ratio: 11 (ref 10–24)
BUN: 10 mg/dL (ref 8–27)
CO2: 19 mmol/L — ABNORMAL LOW (ref 20–29)
Calcium: 9.2 mg/dL (ref 8.6–10.2)
Chloride: 104 mmol/L (ref 96–106)
Creatinine, Ser: 0.87 mg/dL (ref 0.76–1.27)
Glucose: 87 mg/dL (ref 70–99)
Potassium: 4.2 mmol/L (ref 3.5–5.2)
Sodium: 139 mmol/L (ref 134–144)
eGFR: 84 mL/min/1.73 (ref >59)

## 2024-02-21 NOTE — Progress Notes (Signed)
 LILLETTE Kristeen JINNY Gladis, CMA,acting as a Neurosurgeon for Bruna Creighton, NP.,have documented all relevant documentation on the behalf of Bruna Creighton, NP,as directed by  Bruna Creighton, NP while in the presence of Bruna Creighton, NP.  Subjective:  Patient ID: [name] , male    DOB: 13-Mar-1936 , 88 y.o.   MRN: [id number]  Chief Complaint  Patient presents with   Foot Swelling    Patient presents today for right leg and foot swelling, patient had his 5th toe on his right foot amputated on 12/11/23. Patient has had swelling since the surgery. He reports no pain today. He is following up wit surgeon at the end of the month.     HPI Discussed the use of AI scribe software for clinical note transcription with the patient, who gave verbal consent to proceed.  History of Present Illness    [name] is an 88 year old male who presents with right leg and foot swelling following a toe amputation surgery.  Two months ago, he underwent an amputation of his smallest toe on the right foot. Since the surgery, he has experienced persistent swelling in his right leg and foot. The swelling sometimes becomes very hard and large, and although it may decrease slightly, it returns the next day. The swelling is accompanied by pain in the leg, but not in the foot.  He has had several follow-up appointments with the surgeon and has another appointment scheduled for 03/03/2024. A registered nurse visits twice a week to change the dressing on his foot.  He was prescribed an antibiotic by an on-call doctor after experiencing pain one morning, but he has not noticed any change in the swelling since starting the medication. He is also on Plavix , a blood thinner, and has been on it for a considerable time without any changes in his medication regimen.  Previously, he was on restrictions and used a wheelchair, but he has since been cleared to walk and now uses a cane. His boot was recently changed to provide better support for his  leg. The leg looks to slightly red and patient says sometimes it feels warm no other Doppler to rule out any blood clots.  Patient is accompanied by his cousin [name] who is also his power of attorney who did most of the talking.     Past Medical History:  Diagnosis Date   Arthritis    Bundle branch block    Hypercholesteremia    Hyperlipidemia 2001   Hypertension 2001   Hypertension    Peripheral vascular disease (HCC)    Stroke (HCC)    Stroke (HCC)    2001 - R side deficits     Family History  Problem Relation Age of Onset   Stroke Mother    Stroke Father    Heart attack Father    Cancer Brother      Current Outpatient Medications:    acetaminophen  (TYLENOL ) 325 MG tablet, Take 2 tablets (650 mg total) by mouth every 4 (four) hours as needed for headache or mild pain., Disp:  , Rfl:    amLODipine  (NORVASC ) 2.5 MG tablet, TAKE 1 TABLET(2.5 MG) BY MOUTH DAILY (Patient taking differently: Take 2.5 mg by mouth daily.), Disp: 90 tablet, Rfl: 0   aspirin  EC 81 MG tablet, Take 81 mg by mouth daily., Disp: , Rfl:    cephALEXin  (KEFLEX ) 500 MG capsule, Take 1 capsule (500 mg total) by mouth 4 (four) times daily for 14 days., Disp: 56 capsule,  Rfl: 0   Cholecalciferol (VITAMIN D) 50 MCG (2000 UT) tablet, Take 2,000 Units by mouth daily., Disp: , Rfl:    clopidogrel  (PLAVIX ) 75 MG tablet, Take 1 tablet (75 mg total) by mouth daily., Disp: 90 tablet, Rfl: 1   fluticasone  (FLONASE ) 50 MCG/ACT nasal spray, Place 2 sprays into both nostrils daily., Disp: 16 g, Rfl: 2   methocarbamol  (ROBAXIN ) 500 MG tablet, Take 1 tablet (500 mg total) by mouth 3 (three) times daily., Disp: , Rfl:    Misc Natural Products (ELDERBERRY IMMUNE COMPLEX PO), Take 1 capsule by mouth daily., Disp: , Rfl:    montelukast  (SINGULAIR ) 10 MG tablet, Take 1 tablet by mouth daily, Disp: 90 tablet, Rfl: 1   oxyCODONE  (OXY IR/ROXICODONE ) 5 MG immediate release tablet, Take 1-2 tablets (5-10 mg total) by mouth every 4  (four) hours as needed for moderate pain (pain score 4-6)., Disp: 30 tablet, Rfl: 0   polyethylene glycol (MIRALAX  / GLYCOLAX ) 17 g packet, Take 17 g by mouth daily., Disp: 14 each, Rfl: 0   rosuvastatin  (CRESTOR ) 20 MG tablet, Take 1 tablet (20 mg total) by mouth daily., Disp: 30 tablet, Rfl: 1   tamsulosin  (FLOMAX ) 0.4 MG CAPS capsule, Take 1 capsule (0.4 mg total) by mouth daily., Disp: 90 capsule, Rfl: 1   zinc gluconate 50 MG tablet, Take 50 mg by mouth daily., Disp: , Rfl:    No Known Allergies   Review of Systems  Constitutional: Negative.   HENT: Negative.    Respiratory: Negative.    Musculoskeletal:  Positive for gait problem.       Had surgery 2 months ago, has a boot on his right leg.  Came to the office today walking with a cane.  Skin:  Positive for color change and wound.       The wound on his right leg upper part of the looks red and swollen   Neurological:  Negative for light-headedness and headaches.  Psychiatric/Behavioral: Negative.       Today's Vitals   02/21/24 1028  BP: 120/60  Pulse: 63  Temp: 98 F (36.7 C)  TempSrc: Oral  Weight: 168 lb 12.8 oz (76.6 kg)  Height: 5' 9 (1.753 m)  PainSc: 0-No pain   Body mass index is 24.93 kg/m.  Wt Readings from Last 3 Encounters:  02/21/24 168 lb 12.8 oz (76.6 kg)  12/11/23 176 lb 5.9 oz (80 kg)  11/11/23 177 lb (80.3 kg)    The ASCVD Risk score (Arnett DK, et al., 2019) failed to calculate for the following reasons:   The 2019 ASCVD risk score is only valid for ages 15 to 56   Risk score cannot be calculated because patient has a medical history suggesting prior/existing ASCVD  Objective:  Physical Exam HENT:     Head: Normocephalic.  Musculoskeletal:        General: Swelling, tenderness and signs of injury present.     Right lower leg: Edema present.  Skin:    General: Skin is warm and dry.  Neurological:     Mental Status: He is alert. Mental status is at baseline.  Psychiatric:        Mood and  Affect: Mood normal.         Assessment And Plan:  Chronic swelling post-toe amputation. Possible fluid retention or DVT. On Plavix , but DVT needs exclusion. - Order Doppler ultrasound of the right leg to rule out DVT. - Coordinate with the vascular center for imaging. -  Verify if the test can be done today.  Assessment & Plan Right leg swelling Post-surgical care Post-amputation care ongoing. No infection or drainage. Transitioned to cane and supportive boot. - Continue regular dressing changes by RN twice a week. - Follow up with the surgeon as scheduled.    Return for 6 month bp check.  Patient was given opportunity to ask questions. Patient verbalized understanding of the plan and was able to repeat key elements of the plan. All questions were answered to their satisfaction.    I, Bruna Creighton, NP, have reviewed all documentation for this visit. The documentation on 02/24/2024 for the exam, diagnosis, procedures, and orders are all accurate and complete.    IF YOU HAVE BEEN REFERRED TO A SPECIALIST, IT MAY TAKE 1-2 WEEKS TO SCHEDULE/PROCESS THE REFERRAL. IF YOU HAVE NOT HEARD FROM US /SPECIALIST IN TWO WEEKS, PLEASE GIVE US  A CALL AT [phone].

## 2024-02-24 ENCOUNTER — Ambulatory Visit: Payer: Self-pay | Admitting: Family Medicine

## 2024-02-24 ENCOUNTER — Ambulatory Visit (HOSPITAL_COMMUNITY)
Admission: RE | Admit: 2024-02-24 | Discharge: 2024-02-24 | Disposition: A | Discharge status: Home / Self Care | Source: Ambulatory Visit | Attending: Family Medicine | Admitting: Family Medicine

## 2024-02-24 DIAGNOSIS — R599 Enlarged lymph nodes, unspecified: Secondary | ICD-10-CM

## 2024-02-24 DIAGNOSIS — M7989 Other specified soft tissue disorders: Secondary | ICD-10-CM | POA: Diagnosis not present

## 2024-02-24 NOTE — Progress Notes (Signed)
 A1c has improved, dropped from 6.0 to 5.7 continue the great job of moderate sweets and carb intake.  Thank you

## 2024-02-24 NOTE — Assessment & Plan Note (Addendum)
 Rosuvastatin  20 mg daily, check lipid panel at next visit

## 2024-02-24 NOTE — Assessment & Plan Note (Signed)
 Chronic swelling post-toe amputation. Possible fluid retention or DVT. On Plavix , but DVT needs exclusion. - Order Doppler ultrasound of the right leg to rule out DVT. - Coordinate with the vascular center for imaging. - Verify if the test can be done today.

## 2024-02-25 DIAGNOSIS — I739 Peripheral vascular disease, unspecified: Secondary | ICD-10-CM | POA: Diagnosis not present

## 2024-02-25 DIAGNOSIS — I480 Paroxysmal atrial fibrillation: Secondary | ICD-10-CM | POA: Diagnosis not present

## 2024-02-25 DIAGNOSIS — I272 Pulmonary hypertension, unspecified: Secondary | ICD-10-CM | POA: Diagnosis not present

## 2024-02-25 DIAGNOSIS — Z4781 Encounter for orthopedic aftercare following surgical amputation: Secondary | ICD-10-CM | POA: Diagnosis not present

## 2024-02-25 DIAGNOSIS — M65171 Other infective (teno)synovitis, right ankle and foot: Secondary | ICD-10-CM | POA: Diagnosis not present

## 2024-02-25 DIAGNOSIS — E43 Unspecified severe protein-calorie malnutrition: Secondary | ICD-10-CM | POA: Diagnosis not present

## 2024-02-25 DIAGNOSIS — M86171 Other acute osteomyelitis, right ankle and foot: Secondary | ICD-10-CM | POA: Diagnosis not present

## 2024-02-25 DIAGNOSIS — Z89421 Acquired absence of other right toe(s): Secondary | ICD-10-CM | POA: Diagnosis not present

## 2024-02-25 DIAGNOSIS — I69351 Hemiplegia and hemiparesis following cerebral infarction affecting right dominant side: Secondary | ICD-10-CM | POA: Diagnosis not present

## 2024-02-26 ENCOUNTER — Ambulatory Visit: Payer: Medicare PPO | Admitting: Nurse Practitioner

## 2024-02-26 NOTE — Progress Notes (Signed)
 The Doppler done on the right leg did not show any blood clot.  However it showed an enlarged lymph node in the groin area so I will be referring him to vein and vascular for further evaluation.  Also mentioned this at his visit with the surgeon on 03/03/2024.  Thank you

## 2024-02-27 ENCOUNTER — Other Ambulatory Visit: Payer: Self-pay | Admitting: Nurse Practitioner

## 2024-02-27 DIAGNOSIS — I2510 Atherosclerotic heart disease of native coronary artery without angina pectoris: Secondary | ICD-10-CM

## 2024-02-27 DIAGNOSIS — M65171 Other infective (teno)synovitis, right ankle and foot: Secondary | ICD-10-CM

## 2024-02-27 DIAGNOSIS — I480 Paroxysmal atrial fibrillation: Secondary | ICD-10-CM

## 2024-02-27 DIAGNOSIS — M86171 Other acute osteomyelitis, right ankle and foot: Secondary | ICD-10-CM

## 2024-02-27 DIAGNOSIS — Z4781 Encounter for orthopedic aftercare following surgical amputation: Secondary | ICD-10-CM

## 2024-02-27 DIAGNOSIS — I739 Peripheral vascular disease, unspecified: Secondary | ICD-10-CM

## 2024-02-27 DIAGNOSIS — I69351 Hemiplegia and hemiparesis following cerebral infarction affecting right dominant side: Secondary | ICD-10-CM

## 2024-02-27 DIAGNOSIS — I272 Pulmonary hypertension, unspecified: Secondary | ICD-10-CM

## 2024-02-27 DIAGNOSIS — L97512 Non-pressure chronic ulcer of other part of right foot with fat layer exposed: Secondary | ICD-10-CM

## 2024-02-27 DIAGNOSIS — I10 Essential (primary) hypertension: Secondary | ICD-10-CM

## 2024-02-27 DIAGNOSIS — N400 Enlarged prostate without lower urinary tract symptoms: Secondary | ICD-10-CM

## 2024-02-27 DIAGNOSIS — E782 Mixed hyperlipidemia: Secondary | ICD-10-CM

## 2024-02-27 DIAGNOSIS — Z89421 Acquired absence of other right toe(s): Secondary | ICD-10-CM

## 2024-02-27 DIAGNOSIS — H903 Sensorineural hearing loss, bilateral: Secondary | ICD-10-CM

## 2024-02-27 NOTE — Progress Notes (Unsigned)
 Chief Complaint  Patient presents with   home health initial certification   Received home health orders orders from [facility]. Start of care 12/02/2023.   Certification and orders from 12/02/2023 through 01/30/2024 are reviewed, signed and faxed back to home health company.  Need of intermittent skilled services at home: SN, PT, and OT  The home health care plan has been established by me and will be reviewed and updated as needed to maximize patient recovery.  I certify that all home health services have been and will be furnished to the patient while under my care.  Face-to-face encounter in which the need for home health services was established: seen by another provider for wound care  Patient is receiving home health services for the following diagnoses: Problem List Items Addressed This Visit       Cardiovascular and Mediastinum   Essential hypertension   Atherosclerotic heart disease of native coronary artery without angina pectoris - Primary     Other   Mixed hyperlipidemia   Other Visit Diagnoses       Ulcer of right foot, with fat layer exposed (HCC) [L97.512]         Sensory hearing loss, bilateral [H90.3]         Benign prostatic hyperplasia without lower urinary tract symptoms [N40.0]            Gaines Ada, FNP

## 2024-02-27 NOTE — Progress Notes (Unsigned)
 Chief Complaint  Patient presents with   home health initial certification    Received home health orders orders from [facility]. Start of care 01/12/2024.   Certification and orders from 01/12/2024 through 03/11/2024 are reviewed, signed and faxed back to home health company.  Need of intermittent skilled services at home: SN  The home health care plan has been established by me and will be reviewed and updated as needed to maximize patient recovery.  I certify that all home health services have been and will be furnished to the patient while under my care.  Face-to-face encounter in which the need for home health services was established: Discharge from [facility] on 12/17/2023  Patient is receiving home health services for the following diagnoses: Problem List Items Addressed This Visit       Cardiovascular and Mediastinum   PVD (peripheral vascular disease) (HCC)   Paroxysmal atrial fibrillation (HCC)   Pulmonary hypertension, unspecified (HCC) - Primary   Other Visit Diagnoses       Hemiparesis affecting right side as late effect of cerebrovascular accident (HCC) [I69.351]         Other infective (teno)synovitis, right ankle and foot [M65.171]         Acute osteomyelitis of right ankle or foot (HCC) [M86.171]         Absence of toe of right foot (HCC) [S10.578]         Encounter for orthopedic aftercare following surgical amputation [Z47.81]            Gaines Ada, FNP

## 2024-02-28 DIAGNOSIS — I480 Paroxysmal atrial fibrillation: Secondary | ICD-10-CM | POA: Diagnosis not present

## 2024-02-28 DIAGNOSIS — I69351 Hemiplegia and hemiparesis following cerebral infarction affecting right dominant side: Secondary | ICD-10-CM | POA: Diagnosis not present

## 2024-02-28 DIAGNOSIS — E43 Unspecified severe protein-calorie malnutrition: Secondary | ICD-10-CM | POA: Diagnosis not present

## 2024-02-28 DIAGNOSIS — M65171 Other infective (teno)synovitis, right ankle and foot: Secondary | ICD-10-CM | POA: Diagnosis not present

## 2024-02-28 DIAGNOSIS — M86171 Other acute osteomyelitis, right ankle and foot: Secondary | ICD-10-CM | POA: Diagnosis not present

## 2024-02-28 DIAGNOSIS — I272 Pulmonary hypertension, unspecified: Secondary | ICD-10-CM | POA: Diagnosis not present

## 2024-02-28 DIAGNOSIS — I739 Peripheral vascular disease, unspecified: Secondary | ICD-10-CM | POA: Diagnosis not present

## 2024-02-28 DIAGNOSIS — Z89421 Acquired absence of other right toe(s): Secondary | ICD-10-CM | POA: Diagnosis not present

## 2024-02-28 DIAGNOSIS — Z4781 Encounter for orthopedic aftercare following surgical amputation: Secondary | ICD-10-CM | POA: Diagnosis not present

## 2024-03-03 ENCOUNTER — Ambulatory Visit (INDEPENDENT_AMBULATORY_CARE_PROVIDER_SITE_OTHER): Admitting: Podiatry

## 2024-03-03 DIAGNOSIS — Z4781 Encounter for orthopedic aftercare following surgical amputation: Secondary | ICD-10-CM | POA: Diagnosis not present

## 2024-03-03 DIAGNOSIS — I69351 Hemiplegia and hemiparesis following cerebral infarction affecting right dominant side: Secondary | ICD-10-CM | POA: Diagnosis not present

## 2024-03-03 DIAGNOSIS — I739 Peripheral vascular disease, unspecified: Secondary | ICD-10-CM | POA: Diagnosis not present

## 2024-03-03 DIAGNOSIS — M869 Osteomyelitis, unspecified: Secondary | ICD-10-CM

## 2024-03-03 DIAGNOSIS — I480 Paroxysmal atrial fibrillation: Secondary | ICD-10-CM | POA: Diagnosis not present

## 2024-03-03 DIAGNOSIS — M65171 Other infective (teno)synovitis, right ankle and foot: Secondary | ICD-10-CM | POA: Diagnosis not present

## 2024-03-03 DIAGNOSIS — I272 Pulmonary hypertension, unspecified: Secondary | ICD-10-CM | POA: Diagnosis not present

## 2024-03-03 DIAGNOSIS — M86171 Other acute osteomyelitis, right ankle and foot: Secondary | ICD-10-CM | POA: Diagnosis not present

## 2024-03-03 DIAGNOSIS — Z89421 Acquired absence of other right toe(s): Secondary | ICD-10-CM | POA: Diagnosis not present

## 2024-03-03 DIAGNOSIS — Z9889 Other specified postprocedural states: Secondary | ICD-10-CM

## 2024-03-03 DIAGNOSIS — E43 Unspecified severe protein-calorie malnutrition: Secondary | ICD-10-CM | POA: Diagnosis not present

## 2024-03-03 NOTE — Progress Notes (Signed)
  Subjective:  Patient ID: [name], male    DOB: 22-Apr-1936,  MRN: [id number]   DOS: 12/11/2023 Procedure: 1.  Partial fifth ray amputation with excision of ulceration, right foot 2.  Application dissolvable antibiotic beads, right foot 3.  Application dermal allograft, 38 cm, right foot  88 y.o. male seen for post op check.   Pt coming in for post op check in office 11 weeks post op.   Reports he is doing well he is having wound care nurse come out do twice weekly dressing changes to put some collagen dressing on earlier today.  Coming out later this week.  Has not seen much drainage.  Has seen some edema in the right lower extremity  Review of Systems: Negative except as noted in the HPI. Denies N/V/F/Ch.   Objective:   Constitutional Well developed. Well nourished.  Vascular Foot warm and well perfused. Capillary refill normal to all digits.   No calf pain with palpation  Neurologic Normal speech. Oriented to person, place, and time. Epicritic sensation diminished to right foot  Dermatologic Amp site with ulceration present along the incision line measures approximately 2 x 3 cm much improved from prior and is only superficial to the subcutaneous fat tissue layer appears to be healing in nicely      Orthopedic: Status post right partial fifth ray amputation   Radiographs: Amputation of fifth ray at the proximal aspect of the fifth metatarsal  Pathology: A. TOE, RIGHT FIFTH, AMPUTATION:  Ulceration with acute inflammation and acute osteomyelitis.   B. BONE, RIGHT FIFTH, PROXIMAL, MARGIN:  Benign bone and connective tissue.  Negative for osteomyelitis.   Micro:  Staph aureus   Assessment:   Osteomyelitis of right fifth ray status post right partial fifth ray amputation  Plan:  Patient was evaluated and treated and all questions answered.  11 weeks s/p partial fifth ray amputation right foot -Progressing as expected postoperatively, wound present along the  incision line and overlying the amputation site with healthy granular tissue base.  Applied a collagen dressing contact layer followed by 4 x 4 gauze Kerlix Ace wrap. -Overall continues to improve after debridement which was performed excisionally with tissue nipper today to healthy bleeding tissue bed subcutaneous fat tissue layer, will need ongoing wound care with debridements and dressing changes.    -Recommend 3X weekly dressing changes as below   -XR: Expected postop changes -WB Status: Weightbearing as tolerated in cam boot  -Sutures: Previously removed -Medications/ABX:   s/p course of post op doxycycline  no further antibiotics indicated -Dressing:   right foot dressing 3x weekly cleanse incision site with betadine or wound wash/Vashe, apply collagen style dressing contact layer and provided this to the patient, then apply 4x4 guaze, abd pad, kerlix and ace wrap.  - F/u Plan: Follow-up in 3 weeks for wound check        Marolyn JULIANNA Honour, DPM [facility]

## 2024-03-04 ENCOUNTER — Other Ambulatory Visit: Payer: Self-pay | Admitting: Surgery

## 2024-03-04 ENCOUNTER — Ambulatory Visit: Payer: Self-pay

## 2024-03-04 ENCOUNTER — Telehealth: Payer: Self-pay

## 2024-03-04 DIAGNOSIS — M79671 Pain in right foot: Secondary | ICD-10-CM

## 2024-03-04 DIAGNOSIS — I739 Peripheral vascular disease, unspecified: Secondary | ICD-10-CM

## 2024-03-04 NOTE — Telephone Encounter (Addendum)
 Called [facility] & advised them of patient's caregiver calling in for advice

## 2024-03-04 NOTE — Telephone Encounter (Signed)
    FYI Only or Action Required?: Action required by provider: clinical question for provider and update on patient condition.  Patient was last seen in primary care on 02/21/2024 by [name], NP.  Called Nurse Triage reporting Right Leg/Foot Swelling.  Symptoms began after toe amputation in May.  Interventions attempted: OTC medications: Tylenol  or Aleve and Rest, hydration, or home remedies.  Symptoms are: not different per caregiver [name].  Triage Disposition: See HCP Within 4 Hours (Or PCP Triage)  Patient/caregiver understands and will follow disposition?: No                     Copied from CRM #[id number]. Topic: Clinical - Red Word Triage >> Mar 04, 2024 11:05 AM Willma R wrote: [facility] that prompted transfer to Nurse Triage: Patients right leg and foot are swollen and hard. Difficulty sleeping as it is painful. Reason for Disposition  [1] Thigh, calf, or ankle swelling AND [2] only 1 side  Answer Assessment - Initial Assessment Questions Foot up to right below the knee Hard as well Keeps it elevated during the day---hurts worse at night  [name] (care giver) --- doesn't think it is bad enough to have to go to the Emergency Room at this time  Care giver asked if something can be recommended by his provider for the swelling He states nothing is very different and the patient takes Tylenol  or Aleve for the pain  Attempted to call patient to verify information---twice---patient answered twice but call disconnected both times   1. ONSET: When did the swelling start? (e.g., minutes, hours, days)     After toe amputation 2. LOCATION: What part of the leg is swollen?  Are both legs swollen or just one leg?     Below knee on right side 3. SEVERITY: How bad is the swelling? (e.g., localized; mild, moderate, severe)     Below the knee 4. REDNESS: Is there redness or signs of infection?     No 5. PAIN: Is the swelling painful to touch? If  Yes, ask: How painful is it?   (Scale 1-10; mild, moderate or severe)     3-5 6. FEVER: Do you have a fever? If Yes, ask: What is it, how was it measured, and when did it start?      No 7. CAUSE: What do you think is causing the leg swelling?     ------- 8. MEDICAL HISTORY: Do you have a history of blood clots (e.g., DVT), cancer, heart failure, kidney disease, or liver failure?     Yes---heart history 9. RECURRENT SYMPTOM: Have you had leg swelling before? If Yes, ask: When was the last time? What happened that time?     This has been seen before but pain getting worse 10. OTHER SYMPTOMS: Do you have any other symptoms? (e.g., chest pain, difficulty breathing)       No  Protocols used: Leg Swelling and Edema-A-AH

## 2024-03-04 NOTE — Telephone Encounter (Signed)
 [name] called to report pt's right foot and leg swelling.  Patient had negative DVT US  on 02/24/24.  Advised to elevate and use moderate compression.  ABI + PA on 03/16/24. Patient notified.

## 2024-03-06 DIAGNOSIS — Z4781 Encounter for orthopedic aftercare following surgical amputation: Secondary | ICD-10-CM | POA: Diagnosis not present

## 2024-03-06 DIAGNOSIS — M86171 Other acute osteomyelitis, right ankle and foot: Secondary | ICD-10-CM | POA: Diagnosis not present

## 2024-03-06 DIAGNOSIS — I69351 Hemiplegia and hemiparesis following cerebral infarction affecting right dominant side: Secondary | ICD-10-CM | POA: Diagnosis not present

## 2024-03-06 DIAGNOSIS — M65171 Other infective (teno)synovitis, right ankle and foot: Secondary | ICD-10-CM | POA: Diagnosis not present

## 2024-03-06 DIAGNOSIS — I739 Peripheral vascular disease, unspecified: Secondary | ICD-10-CM | POA: Diagnosis not present

## 2024-03-06 DIAGNOSIS — I480 Paroxysmal atrial fibrillation: Secondary | ICD-10-CM | POA: Diagnosis not present

## 2024-03-06 DIAGNOSIS — I272 Pulmonary hypertension, unspecified: Secondary | ICD-10-CM | POA: Diagnosis not present

## 2024-03-06 DIAGNOSIS — E43 Unspecified severe protein-calorie malnutrition: Secondary | ICD-10-CM | POA: Diagnosis not present

## 2024-03-06 DIAGNOSIS — Z89421 Acquired absence of other right toe(s): Secondary | ICD-10-CM | POA: Diagnosis not present

## 2024-03-08 DIAGNOSIS — M65171 Other infective (teno)synovitis, right ankle and foot: Secondary | ICD-10-CM | POA: Diagnosis not present

## 2024-03-08 DIAGNOSIS — E43 Unspecified severe protein-calorie malnutrition: Secondary | ICD-10-CM | POA: Diagnosis not present

## 2024-03-08 DIAGNOSIS — I272 Pulmonary hypertension, unspecified: Secondary | ICD-10-CM | POA: Diagnosis not present

## 2024-03-08 DIAGNOSIS — I69351 Hemiplegia and hemiparesis following cerebral infarction affecting right dominant side: Secondary | ICD-10-CM | POA: Diagnosis not present

## 2024-03-08 DIAGNOSIS — I480 Paroxysmal atrial fibrillation: Secondary | ICD-10-CM | POA: Diagnosis not present

## 2024-03-08 DIAGNOSIS — Z4781 Encounter for orthopedic aftercare following surgical amputation: Secondary | ICD-10-CM | POA: Diagnosis not present

## 2024-03-08 DIAGNOSIS — I739 Peripheral vascular disease, unspecified: Secondary | ICD-10-CM | POA: Diagnosis not present

## 2024-03-08 DIAGNOSIS — M86171 Other acute osteomyelitis, right ankle and foot: Secondary | ICD-10-CM | POA: Diagnosis not present

## 2024-03-08 DIAGNOSIS — Z89421 Acquired absence of other right toe(s): Secondary | ICD-10-CM | POA: Diagnosis not present

## 2024-03-10 ENCOUNTER — Other Ambulatory Visit: Payer: Self-pay | Admitting: Nurse Practitioner

## 2024-03-10 NOTE — Telephone Encounter (Unsigned)
 Copied from CRM #[id number]. Topic: Clinical - Medication Refill >> Mar 10, 2024  3:18 PM Berwyn MATSU wrote: Medication: rosuvastatin  (CRESTOR ) 20 MG tablet  Has the patient contacted their pharmacy? Yes (Agent: If no, request that the patient contact the pharmacy for the refill. If patient does not wish to contact the pharmacy document the reason why and proceed with request.) (Agent: If yes, when and what did the pharmacy advise?)  This is the patient's preferred pharmacy:  [facility] [phone] - [place], [place] - [address] AT [facility] [address] [place] [address] Phone: [phone] Fax: [phone]  Is this the correct pharmacy for this prescription? Yes If no, delete pharmacy and type the correct one.   Has the prescription been filled recently? Yes  Is the patient out of the medication? Yes  Has the patient been seen for an appointment in the last year OR does the patient have an upcoming appointment? Yes  Can we respond through MyChart? Yes  Agent: Please be advised that Rx refills may take up to 3 business days. We ask that you follow-up with your pharmacy.

## 2024-03-11 MED ORDER — ROSUVASTATIN CALCIUM 20 MG PO TABS: 20.0000 mg | ORAL_TABLET | Freq: Every day | ORAL | 1 refills | Status: DC

## 2024-03-12 DIAGNOSIS — Z4781 Encounter for orthopedic aftercare following surgical amputation: Secondary | ICD-10-CM | POA: Diagnosis not present

## 2024-03-12 DIAGNOSIS — E43 Unspecified severe protein-calorie malnutrition: Secondary | ICD-10-CM | POA: Diagnosis not present

## 2024-03-12 DIAGNOSIS — Z89421 Acquired absence of other right toe(s): Secondary | ICD-10-CM | POA: Diagnosis not present

## 2024-03-12 DIAGNOSIS — M65171 Other infective (teno)synovitis, right ankle and foot: Secondary | ICD-10-CM | POA: Diagnosis not present

## 2024-03-12 DIAGNOSIS — I70201 Unspecified atherosclerosis of native arteries of extremities, right leg: Secondary | ICD-10-CM | POA: Diagnosis not present

## 2024-03-12 DIAGNOSIS — I272 Pulmonary hypertension, unspecified: Secondary | ICD-10-CM | POA: Diagnosis not present

## 2024-03-12 DIAGNOSIS — M86171 Other acute osteomyelitis, right ankle and foot: Secondary | ICD-10-CM | POA: Diagnosis not present

## 2024-03-12 DIAGNOSIS — I69351 Hemiplegia and hemiparesis following cerebral infarction affecting right dominant side: Secondary | ICD-10-CM | POA: Diagnosis not present

## 2024-03-12 DIAGNOSIS — I480 Paroxysmal atrial fibrillation: Secondary | ICD-10-CM | POA: Diagnosis not present

## 2024-03-16 ENCOUNTER — Ambulatory Visit: Attending: Surgery | Admitting: Physician Assistant

## 2024-03-16 ENCOUNTER — Other Ambulatory Visit (HOSPITAL_COMMUNITY): Payer: Self-pay

## 2024-03-16 ENCOUNTER — Ambulatory Visit (HOSPITAL_COMMUNITY)
Admission: RE | Admit: 2024-03-16 | Discharge: 2024-03-16 | Disposition: A | Discharge status: Home / Self Care | Source: Ambulatory Visit | Attending: Surgery | Admitting: Surgery

## 2024-03-16 VITALS — BP 144/67 | HR 75 | Temp 98.6°F | Wt 168.0 lb

## 2024-03-16 DIAGNOSIS — M79671 Pain in right foot: Secondary | ICD-10-CM | POA: Diagnosis not present

## 2024-03-16 DIAGNOSIS — M7989 Other specified soft tissue disorders: Secondary | ICD-10-CM | POA: Diagnosis not present

## 2024-03-16 DIAGNOSIS — I739 Peripheral vascular disease, unspecified: Secondary | ICD-10-CM | POA: Diagnosis not present

## 2024-03-16 LAB — VAS US ABI WITH/WO TBI
Left ABI: 1.06
Right ABI: 0.45

## 2024-03-16 MED ORDER — CEPHALEXIN 500 MG PO CAPS
500.0000 mg | ORAL_CAPSULE | Freq: Two times a day (BID) | ORAL | 0 refills | Status: AC
  Filled 2024-03-16: qty 14, 7d supply, fill #0

## 2024-03-16 NOTE — Progress Notes (Signed)
 HISTORY AND PHYSICAL     CC:  follow up. Requesting Provider:  Georgina Speaks, FNP  HPI: This is a 88 y.o. male who is here today for follow up for PAD.  He has a known left external and and common iliac artery occlusion  Pt has hx of partial 5th ray amputation with excision of ulceration right foot with osteomyelitis on 12/11/2023 by Dr. Malvin.   Vascular surgery was consulted prior to his surgery and pt had a toe pressure of 77 and felt he had adequate perfusion for healing.  It was felt that he would ultimately require an extra anatomic bypass for revascularization but this was not offered as pt was not having any rest pain.  If amputation site did not heal, he would also require bypass.   He was seen by Dr. Malvin on 03/03/2024 and at that time, his wound was progressing as expected.    He has hx of CABG x 3 11/20/2019.  The pt returns today for follow up.  He had called a couple of weeks ago and reported right foot and leg swelling.   On 02/24/2024, he did have a negative DVT study.  He was scheduled for ABI and see PA today.   He and family member state that since he had his toe amputation, he has had swelling in the right leg.  It has been swollen and red and had negative DVT study on 02/24/2024.  It is painful and red.  They tell me they have been to the PCP but they were not comfortable giving him abx without him being seen by surgeon or vascular surgery.    The pt is on a statin for cholesterol management.    The pt is on an aspirin .    Other AC:  Plavix  The pt is on CCB for hypertension.  The pt is not on diabetic medication. Tobacco hx:  never    Past Medical History:  Diagnosis Date   Arthritis    Bundle branch block    Hypercholesteremia    Hyperlipidemia 2001   Hypertension 2001   Hypertension    Peripheral vascular disease (HCC)    Stroke [facility])    Stroke (HCC)    2001 - R side deficits    Past Surgical History:  Procedure Laterality Date   ABDOMINAL  AORTOGRAM N/A 11/18/2019   Procedure: ABDOMINAL AORTOGRAM;  Surgeon: Dann Candyce RAMAN, MD;  Location: [facility] INVASIVE CV LAB;  Service: Cardiovascular;  Laterality: N/A;   AMPUTATION Right 12/11/2023   Procedure: Right foot partial fifth ray amputation;  Surgeon: Malvin Marsa FALCON, DPM;  Location: [facility] OR;  Service: Orthopedics/Podiatry;  Laterality: Right;   CORONARY ARTERY BYPASS GRAFT N/A 11/20/2019   Procedure: CORONARY ARTERY BYPASS GRAFTING (CABG), ON PUMP, TIMES THREE, USING LEFT INTERNAL MAMMARY ARTERY AND ENDOSCOPICALLY HARVESTED RIGHT GREATER SAPHENOUS VEIN;  Surgeon: German Bartlett PEDLAR, MD;  Location: [facility] OR;  Service: Open Heart Surgery;  Laterality: N/A;   LEFT HEART CATH AND CORONARY ANGIOGRAPHY N/A 11/18/2019   Procedure: LEFT HEART CATH AND CORONARY ANGIOGRAPHY;  Surgeon: Dann Candyce RAMAN, MD;  Location: [facility] INVASIVE CV LAB;  Service: Cardiovascular;  Laterality: N/A;   TEE WITHOUT CARDIOVERSION N/A 11/20/2019   Procedure: TRANSESOPHAGEAL ECHOCARDIOGRAM (TEE);  Surgeon: German Bartlett PEDLAR, MD;  Location: [facility] OR;  Service: Open Heart Surgery;  Laterality: N/A;    No Known Allergies  Current Outpatient Medications  Medication Sig Dispense Refill   acetaminophen  (TYLENOL ) 325 MG tablet Take 2 tablets (650  mg total) by mouth every 4 (four) hours as needed for headache or mild pain.     amLODipine  (NORVASC ) 2.5 MG tablet TAKE 1 TABLET(2.5 MG) BY MOUTH DAILY (Patient taking differently: Take 2.5 mg by mouth daily.) 90 tablet 0   aspirin  EC 81 MG tablet Take 81 mg by mouth daily.     Cholecalciferol (VITAMIN D) 50 MCG (2000 UT) tablet Take 2,000 Units by mouth daily.     clopidogrel  (PLAVIX ) 75 MG tablet Take 1 tablet (75 mg total) by mouth daily. 90 tablet 1   fluticasone  (FLONASE ) 50 MCG/ACT nasal spray Place 2 sprays into both nostrils daily. 16 g 2   methocarbamol  (ROBAXIN ) 500 MG tablet Take 1 tablet (500 mg total) by mouth 3 (three) times daily.     Misc Natural Products  (ELDERBERRY IMMUNE COMPLEX PO) Take 1 capsule by mouth daily.     montelukast  (SINGULAIR ) 10 MG tablet Take 1 tablet by mouth daily 90 tablet 1   oxyCODONE  (OXY IR/ROXICODONE ) 5 MG immediate release tablet Take 1-2 tablets (5-10 mg total) by mouth every 4 (four) hours as needed for moderate pain (pain score 4-6). 30 tablet 0   polyethylene glycol (MIRALAX  / GLYCOLAX ) 17 g packet Take 17 g by mouth daily. 14 each 0   rosuvastatin  (CRESTOR ) 20 MG tablet Take 1 tablet (20 mg total) by mouth daily. 30 tablet 1   tamsulosin  (FLOMAX ) 0.4 MG CAPS capsule Take 1 capsule (0.4 mg total) by mouth daily. 90 capsule 1   zinc gluconate 50 MG tablet Take 50 mg by mouth daily.     No current facility-administered medications for this visit.    Family History  Problem Relation Age of Onset   Stroke Mother    Stroke Father    Heart attack Father    Cancer Brother     Social History   Socioeconomic History   Marital status: Single    Spouse name: Not on file   Number of children: Not on file   Years of education: Not on file   Highest education level: Not on file  Occupational History   Occupation: retired  Tobacco Use   Smoking status: Never   Smokeless tobacco: Never  Vaping Use   Vaping status: Not on file  Substance and Sexual Activity   Alcohol use: Never   Drug use: Never   Sexual activity: Not Currently  Other Topics Concern   Not on file  Social History Narrative   ** Merged History Encounter **       Social Drivers of Health   Financial Resource Strain: Low Risk  (05/29/2023)   Overall Financial Resource Strain (CARDIA)    Difficulty of Paying Living Expenses: Not hard at all  Food Insecurity: No Food Insecurity (12/10/2023)   Hunger Vital Sign    Worried About Running Out of Food in the Last Year: Never true    Ran Out of Food in the Last Year: Never true  Transportation Needs: No Transportation Needs (12/10/2023)   PRAPARE - Administrator, Civil Service  (Medical): No    Lack of Transportation (Non-Medical): No  Physical Activity: Insufficiently Active (05/29/2023)   Exercise Vital Sign    Days of Exercise per Week: 3 days    Minutes of Exercise per Session: 20 min  Stress: No Stress Concern Present (05/29/2023)   [employer] of Occupational Health - Occupational Stress Questionnaire    Feeling of Stress : Not at all  Social  Connections: Socially Isolated (12/10/2023)   Social Connection and Isolation Panel    Frequency of Communication with Friends and Family: More than three times a week    Frequency of Social Gatherings with Friends and Family: More than three times a week    Attends Religious Services: Never    Database administrator or Organizations: No    Attends Banker Meetings: Never    Marital Status: Never married  Intimate Partner Violence: Not At Risk (12/10/2023)   Humiliation, Afraid, Rape, and Kick questionnaire    Fear of Current or Ex-Partner: No    Emotionally Abused: No    Physically Abused: No    Sexually Abused: No     REVIEW OF SYSTEMS:   [X]  denotes positive finding, [ ]  denotes negative finding Cardiac  Comments:  Chest pain or chest pressure:    Shortness of breath upon exertion:    Short of breath when lying flat:    Irregular heart rhythm:        Vascular    Pain in calf, thigh, or hip brought on by ambulation:    Pain in feet at night that wakes you up from your sleep:     Blood clot in your veins:    Leg swelling:  x       Pulmonary    Oxygen at home:    Productive cough:     Wheezing:         Neurologic    Sudden weakness in arms or legs:     Sudden numbness in arms or legs:     Sudden onset of difficulty speaking or slurred speech:    Temporary loss of vision in one eye:     Problems with dizziness:         Gastrointestinal    Blood in stool:     Vomited blood:         Genitourinary    Burning when urinating:     Blood in urine:        Psychiatric    Major  depression:         Hematologic    Bleeding problems:    Problems with blood clotting too easily:        Skin    Rashes or ulcers: x       Constitutional    Fever or chills:      PHYSICAL EXAMINATION:  Today's Vitals   03/16/24 1418  BP: (!) 144/67  Pulse: 75  Temp: 98.6 F (37 C)  TempSrc: Temporal  Weight: 168 lb (76.2 kg)  PainSc: 0-No pain   Body mass index is 24.81 kg/m.   General:  WDWN in NAD; vital signs documented above Gait: Not observed HENT: WNL, normocephalic Pulmonary: normal non-labored breathing , without wheezing Cardiac: regular HR Skin: some erythema RLE Vascular Exam/Pulses: Palpable left DP pulse Brisk monophasic doppler flow right DP/PT/peroneal.   Extremities: dressing removed from right foot today as it was too tight; wound as pictured:     Musculoskeletal: no muscle wasting or atrophy  Neurologic: A&O X 3 Psychiatric:  The pt has Normal affect.   Non-Invasive Vascular Imaging:   ABI's/TBI's on 03/16/2024: Right:  0.45/0.17 - Great toe pressure: 22 Left:  1.06/0.74 - Great toe pressure: 98  Summary:  Right: Resting right ankle-brachial index indicates severe right lower extremity arterial disease. The right toe-brachial index is abnormal.   An evaluation of the popliteal, posterior tibial and peroneal veins was  performed and demonstrate compressibility.   Left: Resting left ankle-brachial index is within normal range. The left toe-brachial index is normal.   Previous ABI's/TBI's on 11/05/2022: Right:  0.53/0.48 - Great toe pressure: 85 Left:  0.85/0.65 - Great toe pressure:  116  Previous carotid arterial duplex on 10/05/2021: Bilateral 1-39% ICA stenosis    ASSESSMENT/PLAN:: 88 y.o. male here for follow up for PAD.   He has a known left external and and common iliac artery occlusion  Pt has hx of partial 5th ray amputation with excision of ulceration right foot with osteomyelitis on 12/11/2023 by Dr. Malvin.    -dressing  removed from right foot as it was tight around his foot.  Wound malodorous.  He does have some right leg swelling with erythema.  I put a wet to dry dressing in place on the right foot with a 4 ace wrap from toes to knees to give him a mild compression to help with swelling.  I did send Keflex  500mg  bid x 7 days to the community pharmacy for possible right leg cellulitis.  He has appt with Dr. Malvin later this week.  If wound is not healing and he feels his arterial flow needs to be evaluated, we will see him back sooner.  Otherwise, he will keep his appt in October.  He will elevate his leg as he can tolerate.  -continue asa/statin/plavix     Lucie Apt, [facility] [phone]  Clinic MD:   Serene

## 2024-03-17 ENCOUNTER — Other Ambulatory Visit: Payer: Self-pay

## 2024-03-17 DIAGNOSIS — M65171 Other infective (teno)synovitis, right ankle and foot: Secondary | ICD-10-CM | POA: Diagnosis not present

## 2024-03-17 DIAGNOSIS — I480 Paroxysmal atrial fibrillation: Secondary | ICD-10-CM | POA: Diagnosis not present

## 2024-03-17 DIAGNOSIS — I70201 Unspecified atherosclerosis of native arteries of extremities, right leg: Secondary | ICD-10-CM | POA: Diagnosis not present

## 2024-03-17 DIAGNOSIS — E43 Unspecified severe protein-calorie malnutrition: Secondary | ICD-10-CM | POA: Diagnosis not present

## 2024-03-17 DIAGNOSIS — M86171 Other acute osteomyelitis, right ankle and foot: Secondary | ICD-10-CM | POA: Diagnosis not present

## 2024-03-17 DIAGNOSIS — Z4781 Encounter for orthopedic aftercare following surgical amputation: Secondary | ICD-10-CM | POA: Diagnosis not present

## 2024-03-17 DIAGNOSIS — Z89421 Acquired absence of other right toe(s): Secondary | ICD-10-CM | POA: Diagnosis not present

## 2024-03-17 DIAGNOSIS — I272 Pulmonary hypertension, unspecified: Secondary | ICD-10-CM | POA: Diagnosis not present

## 2024-03-17 DIAGNOSIS — I69351 Hemiplegia and hemiparesis following cerebral infarction affecting right dominant side: Secondary | ICD-10-CM | POA: Diagnosis not present

## 2024-03-17 DIAGNOSIS — I739 Peripheral vascular disease, unspecified: Secondary | ICD-10-CM

## 2024-03-18 ENCOUNTER — Other Ambulatory Visit: Payer: Self-pay | Admitting: Nurse Practitioner

## 2024-03-18 NOTE — Telephone Encounter (Signed)
 Copied from CRM [id number]. Topic: Clinical - Medication Refill >> Mar 18, 2024  4:17 PM Paige D wrote: Medication: tamsulosin  (FLOMAX ) 0.4 MG CAPS capsule  Pt has been reaching out in regards to meds for a week.   Has the patient contacted their pharmacy? Yes (Agent: If no, request that the patient contact the pharmacy for the refill. If patient does not wish to contact the pharmacy document the reason why and proceed with request.) (Agent: If yes, when and what did the pharmacy advise?)  This is the patient's preferred pharmacy:  [facility] [phone] - [place], [place] - [address] AT [facility] [address] [place] [address] Phone: [phone] Fax: [phone]  Is this the correct pharmacy for this prescription? Yes If no, delete pharmacy and type the correct one.   Has the prescription been filled recently? No  Is the patient out of the medication? Yes  Has the patient been seen for an appointment in the last year OR does the patient have an upcoming appointment? Yes  Can we respond through MyChart? Yes  Agent: Please be advised that Rx refills may take up to 3 business days. We ask that you follow-up with your pharmacy.

## 2024-03-19 ENCOUNTER — Other Ambulatory Visit: Payer: Self-pay | Admitting: Nurse Practitioner

## 2024-03-19 ENCOUNTER — Ambulatory Visit (INDEPENDENT_AMBULATORY_CARE_PROVIDER_SITE_OTHER)

## 2024-03-19 ENCOUNTER — Ambulatory Visit (INDEPENDENT_AMBULATORY_CARE_PROVIDER_SITE_OTHER): Admitting: Podiatry

## 2024-03-19 DIAGNOSIS — Z9889 Other specified postprocedural states: Secondary | ICD-10-CM

## 2024-03-19 DIAGNOSIS — M869 Osteomyelitis, unspecified: Secondary | ICD-10-CM

## 2024-03-19 DIAGNOSIS — I10 Essential (primary) hypertension: Secondary | ICD-10-CM

## 2024-03-19 DIAGNOSIS — I739 Peripheral vascular disease, unspecified: Secondary | ICD-10-CM | POA: Diagnosis not present

## 2024-03-19 DIAGNOSIS — L97512 Non-pressure chronic ulcer of other part of right foot with fat layer exposed: Secondary | ICD-10-CM

## 2024-03-19 DIAGNOSIS — J309 Allergic rhinitis, unspecified: Secondary | ICD-10-CM

## 2024-03-19 MED ORDER — TAMSULOSIN HCL 0.4 MG PO CAPS: 0.4000 mg | ORAL_CAPSULE | Freq: Every day | ORAL | 1 refills | Status: AC

## 2024-03-19 MED ORDER — AMLODIPINE BESYLATE 2.5 MG PO TABS: 2.5000 mg | ORAL_TABLET | Freq: Every day | ORAL | 1 refills | Status: AC

## 2024-03-19 MED ORDER — ROSUVASTATIN CALCIUM 20 MG PO TABS: 20.0000 mg | ORAL_TABLET | Freq: Every day | ORAL | 1 refills | Status: DC

## 2024-03-19 MED ORDER — MONTELUKAST SODIUM 10 MG PO TABS: ORAL_TABLET | ORAL | 1 refills | Status: AC

## 2024-03-19 NOTE — Progress Notes (Signed)
 Subjective:  Patient ID: [name], male    DOB: 10-Mar-1936,  MRN: [id number]   DOS: 12/11/2023 Procedure: 1.  Partial fifth ray amputation with excision of ulceration, right foot 2.  Application dissolvable antibiotic beads, right foot 3.  Application dermal allograft, 38 cm, right foot  88 y.o. male seen for post op check.   Pt coming in for post op check in office 14 weeks post op.   Patient has been slow to heal.  He is also concerned about ongoing issues with edema of the right lower extremity.  Having some pain as well related to the edema.  Recently saw vascular surgery who recommended that we could proceed with angiogram if felt necessary however the wound has been healing.  He was placed on Keflex  for a week following that appointment.  Primary care evaluated him for edema of the right lower extremity DVT study was negative and was referred back to our office for further management of the edema which was thought to be related to surgery versus infection.  Review of Systems: Negative except as noted in the HPI. Denies N/V/F/Ch.   Objective:   Constitutional Well developed. Well nourished.  Vascular Foot warm and well perfused. Capillary refill normal to all digits.   No calf pain with palpation  Neurologic Normal speech. Oriented to person, place, and time. Epicritic sensation diminished to right foot  Dermatologic Amp site with ulceration present along the incision line measures approximately 1 x 1.5 cm much improved from prior and is only superficial to the subcutaneous fat tissue layer appears to be healing though slowly.  No significant erythema or malodor today.       Orthopedic: Status post right partial fifth ray amputation   Radiographs: Amputation of fifth ray at the proximal aspect of the fifth metatarsal  03/19/2024 XR 3 views AP lateral oblique of the right foot.  Findings: Attention directed to the fifth metatarsal base at the resection margin there does  not appear to be acute osseous erosions seen on lateral or AP views.  Appears to be relatively sharp clean margin.  Mild periosteal reaction of the fourth metatarsal however no active erosions noted there.  Pathology: A. TOE, RIGHT FIFTH, AMPUTATION:  Ulceration with acute inflammation and acute osteomyelitis.   B. BONE, RIGHT FIFTH, PROXIMAL, MARGIN:  Benign bone and connective tissue.  Negative for osteomyelitis.   Micro:  Staph aureus   Assessment:   Osteomyelitis of right fifth ray status post right partial fifth ray amputation  Plan:  Patient was evaluated and treated and all questions answered.  14 weeks s/p partial fifth ray amputation right foot -Progressing slowly with ongoing wound care, wound present along the incision line and overlying the amputation site with healthy granular tissue base.  Applied a silver  alginate contact layer followed by 4 x 4 gauze Kerlix Ace wrap. -Overall continues to improve after debridement which was performed excisionally with tissue nipper today to healthy bleeding tissue bed subcutaneous fat tissue layer, will need ongoing wound care with debridements and dressing changes.    -Appreciate vascular surgery will discuss with them to determine if  angiogram is warranted -Recommend 3X weekly dressing changes as below   -XR: Reviewed x-ray today no evidence of worsening erosion or acute osteomyelitis of the fifth metatarsal base -WB Status: Weightbearing as tolerated in postop shoe which was dispensed to him at this appointment as he was having difficulty with cam boot -Sutures: Previously removed -Medications/ABX:   Recently on Keflex ,  finish course and monitor off antibiotics -Dressing:   right foot dressing 3x weekly cleanse incision site with  wound wash/Vashe, apply collagen style dressing contact layer  then apply 4x4 guaze, abd pad, kerlix and ace wrap.  - F/u Plan: Follow-up in 3 weeks for wound check  Procedure: Excisional Debridement of  Wound Rationale: Removal of non-viable soft tissue from the wound to promote healing.  Anesthesia: None Pre-Debridement Wound Measurements: 1.5 cm x 1 cm x 0.2 cm  Post-Debridement Wound Measurements: 1.5 cm x 1 cm x 0.2 cm  Type of Debridement: Excisional Tissue Removed: Non-viable soft tissue Depth of Debridement: Subcutaneous fat tissue layer Instrumentation: Tissue nipper Technique: Sharp excisional debridement to bleeding, viable wound base.  Dressing: Dry, sterile, compression dressing. Disposition: Patient tolerated procedure well. Patient to return in 3 week for follow-up.         [name], DPM [facility] / [facility]

## 2024-03-20 DIAGNOSIS — Z4781 Encounter for orthopedic aftercare following surgical amputation: Secondary | ICD-10-CM | POA: Diagnosis not present

## 2024-03-20 DIAGNOSIS — M65171 Other infective (teno)synovitis, right ankle and foot: Secondary | ICD-10-CM | POA: Diagnosis not present

## 2024-03-20 DIAGNOSIS — E43 Unspecified severe protein-calorie malnutrition: Secondary | ICD-10-CM | POA: Diagnosis not present

## 2024-03-20 DIAGNOSIS — I272 Pulmonary hypertension, unspecified: Secondary | ICD-10-CM | POA: Diagnosis not present

## 2024-03-20 DIAGNOSIS — I69351 Hemiplegia and hemiparesis following cerebral infarction affecting right dominant side: Secondary | ICD-10-CM | POA: Diagnosis not present

## 2024-03-20 DIAGNOSIS — M86171 Other acute osteomyelitis, right ankle and foot: Secondary | ICD-10-CM | POA: Diagnosis not present

## 2024-03-20 DIAGNOSIS — I70201 Unspecified atherosclerosis of native arteries of extremities, right leg: Secondary | ICD-10-CM | POA: Diagnosis not present

## 2024-03-20 DIAGNOSIS — Z89421 Acquired absence of other right toe(s): Secondary | ICD-10-CM | POA: Diagnosis not present

## 2024-03-20 DIAGNOSIS — I480 Paroxysmal atrial fibrillation: Secondary | ICD-10-CM | POA: Diagnosis not present

## 2024-03-24 DIAGNOSIS — I480 Paroxysmal atrial fibrillation: Secondary | ICD-10-CM | POA: Diagnosis not present

## 2024-03-24 DIAGNOSIS — I69351 Hemiplegia and hemiparesis following cerebral infarction affecting right dominant side: Secondary | ICD-10-CM | POA: Diagnosis not present

## 2024-03-24 DIAGNOSIS — E43 Unspecified severe protein-calorie malnutrition: Secondary | ICD-10-CM | POA: Diagnosis not present

## 2024-03-24 DIAGNOSIS — M86171 Other acute osteomyelitis, right ankle and foot: Secondary | ICD-10-CM | POA: Diagnosis not present

## 2024-03-24 DIAGNOSIS — I70201 Unspecified atherosclerosis of native arteries of extremities, right leg: Secondary | ICD-10-CM | POA: Diagnosis not present

## 2024-03-24 DIAGNOSIS — Z4781 Encounter for orthopedic aftercare following surgical amputation: Secondary | ICD-10-CM | POA: Diagnosis not present

## 2024-03-24 DIAGNOSIS — Z89421 Acquired absence of other right toe(s): Secondary | ICD-10-CM | POA: Diagnosis not present

## 2024-03-24 DIAGNOSIS — I272 Pulmonary hypertension, unspecified: Secondary | ICD-10-CM | POA: Diagnosis not present

## 2024-03-24 DIAGNOSIS — M65171 Other infective (teno)synovitis, right ankle and foot: Secondary | ICD-10-CM | POA: Diagnosis not present

## 2024-03-28 DIAGNOSIS — M65171 Other infective (teno)synovitis, right ankle and foot: Secondary | ICD-10-CM | POA: Diagnosis not present

## 2024-03-28 DIAGNOSIS — M86171 Other acute osteomyelitis, right ankle and foot: Secondary | ICD-10-CM | POA: Diagnosis not present

## 2024-03-28 DIAGNOSIS — I272 Pulmonary hypertension, unspecified: Secondary | ICD-10-CM | POA: Diagnosis not present

## 2024-03-28 DIAGNOSIS — I480 Paroxysmal atrial fibrillation: Secondary | ICD-10-CM | POA: Diagnosis not present

## 2024-03-28 DIAGNOSIS — I70201 Unspecified atherosclerosis of native arteries of extremities, right leg: Secondary | ICD-10-CM | POA: Diagnosis not present

## 2024-03-28 DIAGNOSIS — I69351 Hemiplegia and hemiparesis following cerebral infarction affecting right dominant side: Secondary | ICD-10-CM | POA: Diagnosis not present

## 2024-03-28 DIAGNOSIS — Z89421 Acquired absence of other right toe(s): Secondary | ICD-10-CM | POA: Diagnosis not present

## 2024-03-28 DIAGNOSIS — E43 Unspecified severe protein-calorie malnutrition: Secondary | ICD-10-CM | POA: Diagnosis not present

## 2024-03-28 DIAGNOSIS — Z4781 Encounter for orthopedic aftercare following surgical amputation: Secondary | ICD-10-CM | POA: Diagnosis not present

## 2024-03-31 DIAGNOSIS — I480 Paroxysmal atrial fibrillation: Secondary | ICD-10-CM | POA: Diagnosis not present

## 2024-03-31 DIAGNOSIS — I70201 Unspecified atherosclerosis of native arteries of extremities, right leg: Secondary | ICD-10-CM | POA: Diagnosis not present

## 2024-03-31 DIAGNOSIS — Z4781 Encounter for orthopedic aftercare following surgical amputation: Secondary | ICD-10-CM | POA: Diagnosis not present

## 2024-03-31 DIAGNOSIS — Z89421 Acquired absence of other right toe(s): Secondary | ICD-10-CM | POA: Diagnosis not present

## 2024-03-31 DIAGNOSIS — I272 Pulmonary hypertension, unspecified: Secondary | ICD-10-CM | POA: Diagnosis not present

## 2024-03-31 DIAGNOSIS — M65171 Other infective (teno)synovitis, right ankle and foot: Secondary | ICD-10-CM | POA: Diagnosis not present

## 2024-03-31 DIAGNOSIS — M86171 Other acute osteomyelitis, right ankle and foot: Secondary | ICD-10-CM | POA: Diagnosis not present

## 2024-03-31 DIAGNOSIS — E43 Unspecified severe protein-calorie malnutrition: Secondary | ICD-10-CM | POA: Diagnosis not present

## 2024-03-31 DIAGNOSIS — I69351 Hemiplegia and hemiparesis following cerebral infarction affecting right dominant side: Secondary | ICD-10-CM | POA: Diagnosis not present

## 2024-04-03 DIAGNOSIS — E43 Unspecified severe protein-calorie malnutrition: Secondary | ICD-10-CM | POA: Diagnosis not present

## 2024-04-03 DIAGNOSIS — I272 Pulmonary hypertension, unspecified: Secondary | ICD-10-CM | POA: Diagnosis not present

## 2024-04-03 DIAGNOSIS — I70201 Unspecified atherosclerosis of native arteries of extremities, right leg: Secondary | ICD-10-CM | POA: Diagnosis not present

## 2024-04-03 DIAGNOSIS — Z4781 Encounter for orthopedic aftercare following surgical amputation: Secondary | ICD-10-CM | POA: Diagnosis not present

## 2024-04-03 DIAGNOSIS — M65171 Other infective (teno)synovitis, right ankle and foot: Secondary | ICD-10-CM | POA: Diagnosis not present

## 2024-04-03 DIAGNOSIS — I480 Paroxysmal atrial fibrillation: Secondary | ICD-10-CM | POA: Diagnosis not present

## 2024-04-03 DIAGNOSIS — M86171 Other acute osteomyelitis, right ankle and foot: Secondary | ICD-10-CM | POA: Diagnosis not present

## 2024-04-03 DIAGNOSIS — Z89421 Acquired absence of other right toe(s): Secondary | ICD-10-CM | POA: Diagnosis not present

## 2024-04-03 DIAGNOSIS — I69351 Hemiplegia and hemiparesis following cerebral infarction affecting right dominant side: Secondary | ICD-10-CM | POA: Diagnosis not present

## 2024-04-07 DIAGNOSIS — I70201 Unspecified atherosclerosis of native arteries of extremities, right leg: Secondary | ICD-10-CM | POA: Diagnosis not present

## 2024-04-07 DIAGNOSIS — I480 Paroxysmal atrial fibrillation: Secondary | ICD-10-CM | POA: Diagnosis not present

## 2024-04-07 DIAGNOSIS — Z4781 Encounter for orthopedic aftercare following surgical amputation: Secondary | ICD-10-CM | POA: Diagnosis not present

## 2024-04-07 DIAGNOSIS — M86171 Other acute osteomyelitis, right ankle and foot: Secondary | ICD-10-CM | POA: Diagnosis not present

## 2024-04-07 DIAGNOSIS — I272 Pulmonary hypertension, unspecified: Secondary | ICD-10-CM | POA: Diagnosis not present

## 2024-04-07 DIAGNOSIS — M65171 Other infective (teno)synovitis, right ankle and foot: Secondary | ICD-10-CM | POA: Diagnosis not present

## 2024-04-07 DIAGNOSIS — Z89421 Acquired absence of other right toe(s): Secondary | ICD-10-CM | POA: Diagnosis not present

## 2024-04-07 DIAGNOSIS — I69351 Hemiplegia and hemiparesis following cerebral infarction affecting right dominant side: Secondary | ICD-10-CM | POA: Diagnosis not present

## 2024-04-07 DIAGNOSIS — E43 Unspecified severe protein-calorie malnutrition: Secondary | ICD-10-CM | POA: Diagnosis not present

## 2024-04-09 ENCOUNTER — Ambulatory Visit: Admitting: Podiatry

## 2024-04-09 ENCOUNTER — Encounter: Payer: Self-pay | Admitting: Podiatry

## 2024-04-09 DIAGNOSIS — I272 Pulmonary hypertension, unspecified: Secondary | ICD-10-CM | POA: Diagnosis not present

## 2024-04-09 DIAGNOSIS — I739 Peripheral vascular disease, unspecified: Secondary | ICD-10-CM

## 2024-04-09 DIAGNOSIS — Z89421 Acquired absence of other right toe(s): Secondary | ICD-10-CM | POA: Diagnosis not present

## 2024-04-09 DIAGNOSIS — M869 Osteomyelitis, unspecified: Secondary | ICD-10-CM

## 2024-04-09 DIAGNOSIS — Z4781 Encounter for orthopedic aftercare following surgical amputation: Secondary | ICD-10-CM | POA: Diagnosis not present

## 2024-04-09 DIAGNOSIS — I69351 Hemiplegia and hemiparesis following cerebral infarction affecting right dominant side: Secondary | ICD-10-CM | POA: Diagnosis not present

## 2024-04-09 DIAGNOSIS — I480 Paroxysmal atrial fibrillation: Secondary | ICD-10-CM | POA: Diagnosis not present

## 2024-04-09 DIAGNOSIS — M86171 Other acute osteomyelitis, right ankle and foot: Secondary | ICD-10-CM | POA: Diagnosis not present

## 2024-04-09 DIAGNOSIS — M65171 Other infective (teno)synovitis, right ankle and foot: Secondary | ICD-10-CM | POA: Diagnosis not present

## 2024-04-09 DIAGNOSIS — E43 Unspecified severe protein-calorie malnutrition: Secondary | ICD-10-CM | POA: Diagnosis not present

## 2024-04-09 DIAGNOSIS — L97512 Non-pressure chronic ulcer of other part of right foot with fat layer exposed: Secondary | ICD-10-CM

## 2024-04-09 DIAGNOSIS — Z9889 Other specified postprocedural states: Secondary | ICD-10-CM

## 2024-04-09 DIAGNOSIS — I70201 Unspecified atherosclerosis of native arteries of extremities, right leg: Secondary | ICD-10-CM | POA: Diagnosis not present

## 2024-04-09 NOTE — Progress Notes (Signed)
 Subjective:  Patient ID: [name], male    DOB: 1936-07-13,  MRN: [id number]   DOS: 12/11/2023 Procedure: 1.  Partial fifth ray amputation with excision of ulceration, right foot 2.  Application dissolvable antibiotic beads, right foot 3.  Application dermal allograft, 38 cm, right foot  88 y.o. male seen for post op check.   He is with his son.  Reports that he is doing well denies issues thinks wound is continuing to heal.  Has been walking in postop shoe.  Hopeful to get back to his regular shoe gear which helps with his walking.  Review of Systems: Negative except as noted in the HPI. Denies N/V/F/Ch.   Objective:   Constitutional Well developed. Well nourished.  Vascular Foot warm and well perfused. Capillary refill normal to all digits.   No calf pain with palpation  Neurologic Normal speech. Oriented to person, place, and time. Epicritic sensation diminished to right foot  Dermatologic Amp site with ulceration present along the incision line measures approximately 1 x 1 cm much improved from prior and is only superficial to the subcutaneous fat tissue layer appears to be healing though slowly.  No significant erythema or malodor today.       Orthopedic: Status post right partial fifth ray amputation   Radiographs: Amputation of fifth ray at the proximal aspect of the fifth metatarsal  03/19/2024 XR 3 views AP lateral oblique of the right foot.  Findings: Attention directed to the fifth metatarsal base at the resection margin there does not appear to be acute osseous erosions seen on lateral or AP views.  Appears to be relatively sharp clean margin.  Mild periosteal reaction of the fourth metatarsal however no active erosions noted there.  Pathology: A. TOE, RIGHT FIFTH, AMPUTATION:  Ulceration with acute inflammation and acute osteomyelitis.   B. BONE, RIGHT FIFTH, PROXIMAL, MARGIN:  Benign bone and connective tissue.  Negative for osteomyelitis.   Micro:   Staph aureus   Assessment:   Osteomyelitis of right fifth ray status post right partial fifth ray amputation  Plan:  Patient was evaluated and treated and all questions answered.  17 weeks s/p partial fifth ray amputation right foot -Progressing with ongoing wound care, wound present along the incision line and overlying the amputation site with healthy granular tissue base.  Applied a 4 x 4 gauze Kerlix and Coban dressing. -Overall continues to improve after debridement which was performed excisionally with tissue nipper today to healthy bleeding tissue bed subcutaneous fat tissue layer, will need ongoing wound care with debridements and dressing changes.    -Recommend 3X weekly dressing changes as below   -WB Status: Weightbearing as tolerated in regular shoe gear -Sutures: Previously removed -Medications/ABX: Monitor off antibiotics not indicated -Dressing:   right foot dressing 3x weekly cleanse incision site with  wound wash/Vashe, apply collagen style dressing contact layer  then apply 4x4 guaze, abd pad, kerlix and ace wrap.  - F/u Plan: Follow-up in 3 weeks for wound check  Procedure: Excisional Debridement of Wound Rationale: Removal of non-viable soft tissue from the wound to promote healing.  Anesthesia: None Pre-Debridement Wound Measurements: 1 cm x 1 cm x 0.2 cm  Post-Debridement Wound Measurements: 1 cm x 1 cm x 0.2 cm  Type of Debridement: Excisional Tissue Removed: Non-viable soft tissue Depth of Debridement: Subcutaneous fat tissue layer Instrumentation: Tissue nipper Technique: Sharp excisional debridement to bleeding, viable wound base.  Dressing: Dry, sterile, compression dressing. Disposition: Patient tolerated procedure well. Patient to  return in 3 week for follow-up.         [name], DPM [facility] / [facility]

## 2024-04-13 DIAGNOSIS — I272 Pulmonary hypertension, unspecified: Secondary | ICD-10-CM | POA: Diagnosis not present

## 2024-04-13 DIAGNOSIS — I480 Paroxysmal atrial fibrillation: Secondary | ICD-10-CM | POA: Diagnosis not present

## 2024-04-13 DIAGNOSIS — E43 Unspecified severe protein-calorie malnutrition: Secondary | ICD-10-CM | POA: Diagnosis not present

## 2024-04-13 DIAGNOSIS — Z4781 Encounter for orthopedic aftercare following surgical amputation: Secondary | ICD-10-CM | POA: Diagnosis not present

## 2024-04-13 DIAGNOSIS — I69351 Hemiplegia and hemiparesis following cerebral infarction affecting right dominant side: Secondary | ICD-10-CM | POA: Diagnosis not present

## 2024-04-13 DIAGNOSIS — I70201 Unspecified atherosclerosis of native arteries of extremities, right leg: Secondary | ICD-10-CM | POA: Diagnosis not present

## 2024-04-13 DIAGNOSIS — M65171 Other infective (teno)synovitis, right ankle and foot: Secondary | ICD-10-CM | POA: Diagnosis not present

## 2024-04-13 DIAGNOSIS — Z89421 Acquired absence of other right toe(s): Secondary | ICD-10-CM | POA: Diagnosis not present

## 2024-04-13 DIAGNOSIS — M86171 Other acute osteomyelitis, right ankle and foot: Secondary | ICD-10-CM | POA: Diagnosis not present

## 2024-04-15 ENCOUNTER — Ambulatory Visit: Payer: Self-pay

## 2024-04-15 ENCOUNTER — Ambulatory Visit: Admitting: Podiatry

## 2024-04-15 ENCOUNTER — Ambulatory Visit

## 2024-04-15 ENCOUNTER — Encounter: Payer: Self-pay | Admitting: Podiatry

## 2024-04-15 VITALS — BP 132/57 | HR 73 | Temp 97.5°F

## 2024-04-15 DIAGNOSIS — M869 Osteomyelitis, unspecified: Secondary | ICD-10-CM

## 2024-04-15 DIAGNOSIS — L97512 Non-pressure chronic ulcer of other part of right foot with fat layer exposed: Secondary | ICD-10-CM

## 2024-04-15 DIAGNOSIS — L03115 Cellulitis of right lower limb: Secondary | ICD-10-CM

## 2024-04-15 MED ORDER — DOXYCYCLINE HYCLATE 100 MG PO TABS: 100.0000 mg | ORAL_TABLET | Freq: Two times a day (BID) | ORAL | 0 refills | Status: AC

## 2024-04-15 NOTE — Telephone Encounter (Signed)
 FYI Only or Action Required?: FYI only for provider.  Patient was last seen in primary care on 02/21/2024 by [name], NP.   Copied from CRM [id number]. Topic: Clinical - Red Word Triage >> Apr 15, 2024  8:20 AM Treva T wrote: [facility] that prompted transfer to Nurse Triage: Received call from caregiver of patient, [name], reports patient has right leg swelling, and inflamed, and red in color. Reports patient has recently had surgery on leg in June.  [name], Representative called for triage and is not listed on the DPR, listed person is going to return the phone call to proceed with triage.

## 2024-04-15 NOTE — Telephone Encounter (Signed)
 LVM for patients Brother to call us  back with a update from podiatry. CM

## 2024-04-15 NOTE — Telephone Encounter (Signed)
 1st attempt, no answer. Left voicemail requesting caregiver or family members give call back for nurse triage.

## 2024-04-15 NOTE — Telephone Encounter (Signed)
 FYI Only or Action Required?: Action required by provider: update on patient condition.  Patient was last seen in primary care on 02/21/2024 by [name], NP.  Called Nurse Triage reporting Leg Swelling.  Symptoms began several days ago.  Interventions attempted: Prescription medications: starting antibiotic today, prescribed by podiatrist.  Symptoms are: unchanged.  Triage Disposition: See Physician Within 4 Hours (or PCP Triage) (overriding See HCP Within 4 Hours (Or PCP Triage))  Patient/caregiver understands and will follow disposition?:   Reason for Disposition  [1] Red area or streak [2] large (> 2 inches or 5 cm)  Answer Assessment - Initial Assessment Questions Noticed edema on right leg that began on 04/13/24. States the leg is red and is weeping. Saw podiatrist today who prescribed antibiotic, states he is going to pick it up now. Will route message to PCP to make her aware. Advised to call back if no improvement or if symptoms worsen.   1. ONSET: When did the swelling start? (e.g., minutes, hours, days)     04/13/24  2. LOCATION: What part of the leg is swollen?  Are both legs swollen or just one leg?     Right leg, same side as right toe amputation  3. SEVERITY: How bad is the swelling? (e.g., localized; mild, moderate, severe)     Not currently with patient to assess  4. REDNESS: Is there redness or signs of infection?     Yes  5. PAIN: Is the swelling painful to touch? If Yes, ask: How painful is it?   (Scale 1-10; mild, moderate or severe)     Not with patient to assess  Protocols used: Leg Swelling and Edema-A-AH

## 2024-04-15 NOTE — Progress Notes (Unsigned)
 Subjective:  Patient ID: [name], male    DOB: 04-17-1936,  MRN: [id number]   DOS: 12/11/2023 Procedure: 1.  Partial fifth ray amputation with excision of ulceration, right foot 2.  Application dissolvable antibiotic beads, right foot 3.  Application dermal allograft, 38 cm, right foot  88 y.o. male seen for post op check.   He is with his son.  They have some concerns with pain and swelling about the surgical site.  Has been walking some in postop shoe.  Review of Systems: Negative except as noted in the HPI. Denies N/V/F/Ch.   Objective:   Constitutional Well developed. Well nourished.  Vascular Foot warm and well perfused. Capillary refill normal to all digits.   No calf pain with palpation  Neurologic Normal speech. Oriented to person, place, and time. Epicritic sensation diminished to right foot  Dermatologic Amp site with ulceration present along the incision line measures approximately 1.5 x 1 x 0.3 cm, some overlying fibrotic and loosely adhered necrotic tissue.  Along the incision line dorsally and proximal to this there is area of slight dehiscence about 1 cm in length.  Some erythema present about the leg proximally.       Orthopedic: Status post right partial fifth ray amputation   Radiographs: Amputation of fifth ray at the proximal aspect of the fifth metatarsal  04/15/2024 XR 3 views AP lateral oblique of the right foot.  Findings: Attention directed to the fifth metatarsal base at the resection this appears unchanged from previous without any signs of osteolysis or cortical erosion.  Previously noted periosteal reaction to the fourth metatarsal appears unchanged from previous  Pathology: A. TOE, RIGHT FIFTH, AMPUTATION:  Ulceration with acute inflammation and acute osteomyelitis.   B. BONE, RIGHT FIFTH, PROXIMAL, MARGIN:  Benign bone and connective tissue.  Negative for osteomyelitis.   Micro:  Staph aureus   Assessment:   Osteomyelitis of  right fifth ray status post right partial fifth ray amputation  Plan:  Patient was evaluated and treated and all questions answered.  Left foot ulceration at fifth ray amputation site -Did excisionally debride overlying nonviable necrotic tissue in the main area of ulceration as well as some fibrotic slough in the newly noted area dorsally using #15 blade -Given some regression will have patient go back to using Betadine wet-to-dry dressing changes daily.  Wet-to-dry Betadine dressing applied today. -WB Status: Weightbearing as tolerated in regular shoe gear -Sutures: Previously removed - Some concerns for cellulitis as well as the periosteal reaction seen on radiographs.  Ordering CBC, sed rate, CRP - Start patient on oral doxycycline  based on prior cultures positive for MRSA susceptible to tetracycline. -Consider MRI pending labwork, however it is likely that there will be single present to the fifth metatarsal at the resection site due to recent surgery.  Procedure: Excisional Debridement of Wound Rationale: Removal of non-viable soft tissue from the wound to promote healing.  Anesthesia: None Pre-Debridement Wound Measurements: 1.5 cm x 1 cm x 0.3 cm  Post-Debridement Wound Measurements: 1.6 cm x 1.1 cm x 0.3 cm  Type of Debridement: Excisional Tissue Removed: Non-viable soft tissue Depth of Debridement: Subcutaneous fat tissue layer Instrumentation: Tissue nipper and 15 blade scalpel Technique: Sharp excisional debridement to bleeding, viable wound base.  Dressing: Dry, sterile, compression dressing. Disposition: Patient tolerated procedure well.   Follow-up with Dr. Malvin or myself in approximately 1 week         [name], DPM [facility] / [facility]

## 2024-04-16 DIAGNOSIS — L03115 Cellulitis of right lower limb: Secondary | ICD-10-CM | POA: Diagnosis not present

## 2024-04-17 DIAGNOSIS — E43 Unspecified severe protein-calorie malnutrition: Secondary | ICD-10-CM | POA: Diagnosis not present

## 2024-04-17 DIAGNOSIS — Z4781 Encounter for orthopedic aftercare following surgical amputation: Secondary | ICD-10-CM | POA: Diagnosis not present

## 2024-04-17 DIAGNOSIS — I480 Paroxysmal atrial fibrillation: Secondary | ICD-10-CM | POA: Diagnosis not present

## 2024-04-17 DIAGNOSIS — I69351 Hemiplegia and hemiparesis following cerebral infarction affecting right dominant side: Secondary | ICD-10-CM | POA: Diagnosis not present

## 2024-04-17 DIAGNOSIS — I70201 Unspecified atherosclerosis of native arteries of extremities, right leg: Secondary | ICD-10-CM | POA: Diagnosis not present

## 2024-04-17 DIAGNOSIS — Z89421 Acquired absence of other right toe(s): Secondary | ICD-10-CM | POA: Diagnosis not present

## 2024-04-17 DIAGNOSIS — I272 Pulmonary hypertension, unspecified: Secondary | ICD-10-CM | POA: Diagnosis not present

## 2024-04-17 DIAGNOSIS — M65171 Other infective (teno)synovitis, right ankle and foot: Secondary | ICD-10-CM | POA: Diagnosis not present

## 2024-04-17 DIAGNOSIS — M86171 Other acute osteomyelitis, right ankle and foot: Secondary | ICD-10-CM | POA: Diagnosis not present

## 2024-04-17 LAB — CBC WITH DIFFERENTIAL/PLATELET
Basophils Absolute: 0.1 x10E3/uL (ref 0.0–0.2)
Basos: 1 %
EOS (ABSOLUTE): 0.3 x10E3/uL (ref 0.0–0.4)
Eos: 3 %
Hematocrit: 34 % — ABNORMAL LOW (ref 37.5–51.0)
Hemoglobin: 11.4 g/dL — ABNORMAL LOW (ref 13.0–17.7)
Immature Grans (Abs): 0.1 x10E3/uL (ref 0.0–0.1)
Immature Granulocytes: 1 %
Lymphocytes Absolute: 1.4 x10E3/uL (ref 0.7–3.1)
Lymphs: 15 %
MCH: 29.9 pg (ref 26.6–33.0)
MCHC: 33.5 g/dL (ref 31.5–35.7)
MCV: 89 fL (ref 79–97)
Monocytes Absolute: 1.3 x10E3/uL — ABNORMAL HIGH (ref 0.1–0.9)
Monocytes: 14 %
Neutrophils Absolute: 6.1 x10E3/uL (ref 1.4–7.0)
Neutrophils: 66 %
Platelets: 345 x10E3/uL (ref 150–450)
RBC: 3.81 x10E6/uL — ABNORMAL LOW (ref 4.14–5.80)
RDW: 17.4 % — ABNORMAL HIGH (ref 11.6–15.4)
WBC: 9.2 x10E3/uL (ref 3.4–10.8)

## 2024-04-17 LAB — SEDIMENTATION RATE: Sed Rate: 86 mm/h — ABNORMAL HIGH (ref 0–30)

## 2024-04-17 LAB — C-REACTIVE PROTEIN: CRP: 145 mg/L — ABNORMAL HIGH (ref 0–10)

## 2024-04-20 ENCOUNTER — Ambulatory Visit: Payer: Self-pay | Admitting: Podiatry

## 2024-04-20 ENCOUNTER — Telehealth: Payer: Self-pay

## 2024-04-20 NOTE — Telephone Encounter (Signed)
 Called patient/caregiver to reschedule follow up appt from Wednesday to Thursday or Friday after conversation with provider.   Mr. [name], patient's caregiver/cousin, informed me that he had intended to call in today anyway because the patient's ankle/leg was worsening. The patient reported sensitivity to touch to Mr. Fennell. Per Mr. [name] the patient's ankle/leg is worsening. It is hardening up the leg, becoming darker and turning [name], there is an odor and swelling. Mr. [name] denies that patient has fever. Secure chat with provider and discussed worsening of symptoms and recommendation to take patient to ER for eval. Provider agreed with plan. Advised Mr. [name] to proceed to ER with patient and he agreed with plan as well.    Regarding the follow up on Wednesday, Mr. [name] has concerns about moving the appt to end of week. I did check opening and there are no availabilities on your schedule for Thursday during the day. There does seem to be some on extended hours.

## 2024-04-21 ENCOUNTER — Telehealth: Payer: Self-pay

## 2024-04-21 NOTE — Telephone Encounter (Signed)
 Lab results relayed to caregiver as well. Advised if patient does begin to show signs of worsening or previously reported symptoms to proceed to ER.

## 2024-04-21 NOTE — Progress Notes (Signed)
 Caregiver, [name] Dearth, notified. He also has mychart access and reviewed them prior to my call.

## 2024-04-21 NOTE — Telephone Encounter (Signed)
 Copied from CRM [id number]. Topic: General - Other >> Apr 21, 2024 10:40 AM Travis F wrote: Reason for CRM: [employer] is calling because they received a diagnosis code for Afib and wanted to know if patient's provider thought patient would benefit from an anticoagulant for stroke prevention. [employer] can be reached at [phone]

## 2024-04-21 NOTE — Telephone Encounter (Signed)
 No ER visit per [name]. After call yesterday he did take a look again at wound and found the wound to be improving, which is not what patient originally told him. There is no blackening of the skin, no drainage. There is some swelling and patient is keeping the foot elevated. The nurse is supposed to come out today.

## 2024-04-22 ENCOUNTER — Ambulatory Visit: Admitting: Podiatry

## 2024-04-22 ENCOUNTER — Encounter: Payer: Self-pay | Admitting: Podiatry

## 2024-04-22 DIAGNOSIS — L97512 Non-pressure chronic ulcer of other part of right foot with fat layer exposed: Secondary | ICD-10-CM | POA: Diagnosis not present

## 2024-04-22 DIAGNOSIS — I739 Peripheral vascular disease, unspecified: Secondary | ICD-10-CM

## 2024-04-22 DIAGNOSIS — M869 Osteomyelitis, unspecified: Secondary | ICD-10-CM

## 2024-04-22 NOTE — Patient Instructions (Signed)
 Instructions for Wound Care  The most important step to healing a foot wound is to reduce the pressure on your foot - it is extremely important to stay off your foot as much as possible and wear the shoe/boot as instructed.  Cleanse your foot with saline wash or warm soapy water  (dial antibacterial soap or similar).  Blot dry.  Apply betadine wet to dry gauze to your wound and cover with gauze and a bandage.  May hold bandage in place with Coban (self sticky wrap), Ace bandage or tape.  You may find dressing supplies at your local [employer], [employer], drug store or medical supply store.  Monitor for any signs/symptoms of infection. If there is any increase in redness, red streaks, increase in drainage, warmth to your foot please give us  a call. Also, if you start to run a fever or have flu-like symptoms that can also be a sign of infection. Call the office immediately if any occur or go directly to the emergency room.   If you have any questions, please feel free to give us  a call at [phone] or if you are on MyChart you can always send me a message if needed.

## 2024-04-22 NOTE — Progress Notes (Signed)
 Subjective:  Patient ID: [name], male    DOB: January 20, 1936,  MRN: [id number]   DOS: 12/11/2023 Procedure: 1.  Partial fifth ray amputation with excision of ulceration, right foot 2.  Application dissolvable antibiotic beads, right foot 3.  Application dermal allograft, 38 cm, right foot  88 y.o. male seen for continued wound care of right fifth metatarsal resection site ulceration.  He is accompanied by family member today.  Over the last couple days they did have growing concerns over worsening appearing color changes to the foot and extremity but states that this is improved significantly.  He has been on doxycycline .  Review of Systems: Negative except as noted in the HPI. Denies N/V/F/Ch.   Objective:   Constitutional Well developed. Well nourished.  Vascular Foot warm and well perfused. Capillary refill normal to all digits.   No calf pain with palpation. Some evidence of resolving erythema to right leg, ankle, foot.  Neurologic Normal speech. Oriented to person, place, and time. Epicritic sensation diminished to right foot  Dermatologic Right foot fifth ray amputation site ulceration present.  Ulceration present measuring about 1 x 1.2 x 0.2 cm.  Contracting somewhat.  Fibrous slough overlying the ulceration site.  The area of developing dehiscence to the dorsal incision seems to be improved.  No malodor.  No periwound erythema.  Postdebridement of overlying fibrous slough of subcutaneous tissue measuring 1 x 1.2 x 0.3.  Bleeding wound edges noted.       Orthopedic: Status post right partial fifth ray amputation   Radiographs: Amputation of fifth ray at the proximal aspect of the fifth metatarsal  04/15/2024 XR 3 views AP lateral oblique of the right foot.  Findings: Attention directed to the fifth metatarsal base at the resection this appears unchanged from previous without any signs of osteolysis or cortical erosion.  Previously noted periosteal reaction to the  fourth metatarsal appears unchanged from previous  Pathology: A. TOE, RIGHT FIFTH, AMPUTATION:  Ulceration with acute inflammation and acute osteomyelitis.   B. BONE, RIGHT FIFTH, PROXIMAL, MARGIN:  Benign bone and connective tissue.  Negative for osteomyelitis.   Micro:  Staph aureus   Assessment:   1.  Osteomyelitis right fifth ray status post partial fifth ray amputation 2.  Ulcer right foot with fat layer exposed ray amputation site 3.  PAD  Plan:  Patient was evaluated and treated and all questions answered.  Left foot ulceration at fifth ray amputation site - Today verbal consent was obtained to perform excisional debridement of nonviable skin and subcutaneous tissue overlying fibrous slough from the wound bed.  Debrided to bleeding wound edges and wound bed using #15 blade.  Hemostasis achieved with compression.  No anesthesia required.  Pre and postdebridement measurements as described above. - Betadine wet-to-dry dressing applied today.  Continue with daily dressing changes. -WB Status: Weightbearing as tolerated in surgical shoe or brace, monitor for signs of increased friction to the wound -Reviewed CBC, CRP, sed rate with patient.  No leukocytosis however significant elevation of CRP of 145 and sed rate of 86 from 04/16/2024.  Did discuss that these values are nonspecific but can be indicative of infection and osteomyelitis - Has had improvement on doxycycline  from last visit.  Continue doxycycline  until next visit with Dr. Malvin in 1 week - Case discussed with surgeon Dr. Malvin.  Placing referral to infectious disease for recommendations on obtaining MRI and long-term antibiotics.  Has had previously noted periosteal reaction of the fourth metatarsal,  also possible that there is residual osteomyelitis in the fifth metatarsal though would expect the fifth metatarsal to have increased signal intensity on MRI given recent surgery. - Given ABI results from previous month  decreased pressures right lower extremity and delayed healing with, referral placed to vascular surgery  Procedure: Excisional Debridement of Wound Rationale: Removal of non-viable soft tissue from the wound to promote healing.  Anesthesia: None Pre-Debridement Wound Measurements: 1 x 1.2 x 0.2 cm Post-Debridement Wound Measurements: 1 x 1.2 x 0.3 cm Type of Debridement: Excisional Tissue Removed: Non-viable soft tissue Depth of Debridement: Subcutaneous fat tissue layer Instrumentation: Tissue nipper and 15 blade scalpel Technique: Sharp excisional debridement to bleeding, viable wound base.  Dressing: Dry, sterile, compression dressing. Disposition: Patient tolerated procedure well.   Follow-up with Dr. Malvin in 1 week         Brindy Higginbotham L. Lamount, DPM [facility] / [facility]

## 2024-04-22 NOTE — Telephone Encounter (Signed)
 Patient seen today

## 2024-04-24 DIAGNOSIS — Z89421 Acquired absence of other right toe(s): Secondary | ICD-10-CM | POA: Diagnosis not present

## 2024-04-24 DIAGNOSIS — I480 Paroxysmal atrial fibrillation: Secondary | ICD-10-CM | POA: Diagnosis not present

## 2024-04-24 DIAGNOSIS — Z4781 Encounter for orthopedic aftercare following surgical amputation: Secondary | ICD-10-CM | POA: Diagnosis not present

## 2024-04-24 DIAGNOSIS — I69351 Hemiplegia and hemiparesis following cerebral infarction affecting right dominant side: Secondary | ICD-10-CM | POA: Diagnosis not present

## 2024-04-24 DIAGNOSIS — I70201 Unspecified atherosclerosis of native arteries of extremities, right leg: Secondary | ICD-10-CM | POA: Diagnosis not present

## 2024-04-24 DIAGNOSIS — M86171 Other acute osteomyelitis, right ankle and foot: Secondary | ICD-10-CM | POA: Diagnosis not present

## 2024-04-24 DIAGNOSIS — I272 Pulmonary hypertension, unspecified: Secondary | ICD-10-CM | POA: Diagnosis not present

## 2024-04-24 DIAGNOSIS — E43 Unspecified severe protein-calorie malnutrition: Secondary | ICD-10-CM | POA: Diagnosis not present

## 2024-04-24 DIAGNOSIS — M65171 Other infective (teno)synovitis, right ankle and foot: Secondary | ICD-10-CM | POA: Diagnosis not present

## 2024-04-28 DIAGNOSIS — Z89421 Acquired absence of other right toe(s): Secondary | ICD-10-CM | POA: Diagnosis not present

## 2024-04-28 DIAGNOSIS — I69351 Hemiplegia and hemiparesis following cerebral infarction affecting right dominant side: Secondary | ICD-10-CM | POA: Diagnosis not present

## 2024-04-28 DIAGNOSIS — I480 Paroxysmal atrial fibrillation: Secondary | ICD-10-CM | POA: Diagnosis not present

## 2024-04-28 DIAGNOSIS — Z4781 Encounter for orthopedic aftercare following surgical amputation: Secondary | ICD-10-CM | POA: Diagnosis not present

## 2024-04-28 DIAGNOSIS — I70201 Unspecified atherosclerosis of native arteries of extremities, right leg: Secondary | ICD-10-CM | POA: Diagnosis not present

## 2024-04-28 DIAGNOSIS — E43 Unspecified severe protein-calorie malnutrition: Secondary | ICD-10-CM | POA: Diagnosis not present

## 2024-04-28 DIAGNOSIS — M65171 Other infective (teno)synovitis, right ankle and foot: Secondary | ICD-10-CM | POA: Diagnosis not present

## 2024-04-28 DIAGNOSIS — I272 Pulmonary hypertension, unspecified: Secondary | ICD-10-CM | POA: Diagnosis not present

## 2024-04-28 DIAGNOSIS — M86171 Other acute osteomyelitis, right ankle and foot: Secondary | ICD-10-CM | POA: Diagnosis not present

## 2024-04-30 ENCOUNTER — Encounter: Payer: Self-pay | Admitting: Podiatry

## 2024-04-30 ENCOUNTER — Ambulatory Visit: Admitting: Podiatry

## 2024-04-30 DIAGNOSIS — I739 Peripheral vascular disease, unspecified: Secondary | ICD-10-CM

## 2024-04-30 DIAGNOSIS — L97512 Non-pressure chronic ulcer of other part of right foot with fat layer exposed: Secondary | ICD-10-CM | POA: Diagnosis not present

## 2024-04-30 DIAGNOSIS — M869 Osteomyelitis, unspecified: Secondary | ICD-10-CM

## 2024-04-30 NOTE — Progress Notes (Signed)
 Subjective:  Patient ID: [name], male    DOB: 1935-10-15,  MRN: [id number]   DOS: 12/11/2023 Procedure: 1.  Partial fifth ray amputation with excision of ulceration, right foot 2.  Application dissolvable antibiotic beads, right foot 3.  Application dermal allograft, 38 cm, right foot  88 y.o. male seen for post op check.   He is with his son.   Has been seeing Dr. Lamount.  Recently concern for elevated inflammatory markers including sedimentation rate of 86 and CRP 145.  Referrals been placed to vascular surgery as well as infectious disease.  He has appoint with infectious disease Dr. Fleeta Rothman next week.  Vascular surgery appointment coming up October 20.  Has been doing local wound care as instructed.  Review of Systems: Negative except as noted in the HPI. Denies N/V/F/Ch.   Objective:   Constitutional Well developed. Well nourished.  Vascular Foot warm and well perfused. Capillary refill normal to all digits.   No calf pain with palpation  Neurologic Normal speech. Oriented to person, place, and time. Epicritic sensation diminished to right foot  Dermatologic Amp site with ulceration present along the incision line distally distally measures approximately 0.5 x 0.4 cm.  Improved from prior probes to subcutaneous fat tissue.  There is edema of the foot increased from prior but no significant drainage or erythema at the amputation site.      Orthopedic: Status post right partial fifth ray amputation   Radiographs: Amputation of fifth ray at the proximal aspect of the fifth metatarsal  03/19/2024 XR 3 views AP lateral oblique of the right foot.  Findings: Attention directed to the fifth metatarsal base at the resection margin there does not appear to be acute osseous erosions seen on lateral or AP views.  Appears to be relatively sharp clean margin.  Mild periosteal reaction of the fourth metatarsal however no active erosions noted there.  Per Dr. Dempsey note: 04/15/2024  XR 3 views AP lateral oblique of the right foot. Findings: Attention directed to the fifth metatarsal base at the resection this appears unchanged from previous without any signs of osteolysis or cortical erosion. Previously noted periosteal reaction to the fourth metatarsal appears unchanged from previous  Pathology: A. TOE, RIGHT FIFTH, AMPUTATION:  Ulceration with acute inflammation and acute osteomyelitis.   B. BONE, RIGHT FIFTH, PROXIMAL, MARGIN:  Benign bone and connective tissue.  Negative for osteomyelitis.   Micro:  Staph aureus   Assessment:   Osteomyelitis of right fifth ray status post right partial fifth ray amputation  Plan:  Patient was evaluated and treated and all questions answered.  4.5 months s/p partial fifth ray amputation right foot -Progressing with ongoing wound care, the wound is continue to improve and be smaller each visit.  Today nearly fully healed measures approximately 0.4 x 0.5 cm  -Overall continues to improve after debridement which was performed excisionally with tissue nipper today to healthy bleeding tissue bed subcutaneous fat tissue layer, will need ongoing wound care with debridements and dressing changes.    -Recommend daily antibiotic ointment and Band-Aid style dressing change to the wound  -In regards to the elevated inflammatory markers patient is scheduled to see infectious disease next week 9/29.  Appreciate ID recommendations for antibiotic therapy versus repeat imaging of the right foot versus surgical intervention.  At this point because the wound is improving I am hesitant to intervene surgically though if needed will do so.  He does have persistent swelling of the right foot which  could be a sign of chronic osseous infection. -Patient will also be following up with vascular surgery later in October given slow wound healing and abnormal noninvasive vascular testing -WB Status: Weightbearing as tolerated in regular shoe gear -Sutures:  Previously removed -Medications/ABX: Will hold off on antibiotics until he sees infectious disease next week -Dressing:   right foot dressing to include antibiotic ointment and Band-Aid style dressing - F/u Plan: Follow-up in 2 weeks for wound check  Procedure: Excisional Debridement of Wound Rationale: Removal of non-viable soft tissue from the wound to promote healing.  Anesthesia: None Pre-Debridement Wound Measurements: 0.4 cm x 0.5 cm x 0.2 cm  Post-Debridement Wound Measurements: 0.4  cm x 0.5 cm x 0.2 cm  Type of Debridement: Excisional Tissue Removed: Non-viable soft tissue Depth of Debridement: Subcutaneous fat tissue layer Instrumentation: Tissue nipper Technique: Sharp excisional debridement to bleeding, viable wound base.  Dressing: Dry, sterile, compression dressing. Disposition: Patient tolerated procedure well. Patient to return in 2 week for follow-up.         Marolyn JULIANNA Honour, DPM [facility] / [facility]

## 2024-05-01 DIAGNOSIS — E43 Unspecified severe protein-calorie malnutrition: Secondary | ICD-10-CM | POA: Diagnosis not present

## 2024-05-01 DIAGNOSIS — M65171 Other infective (teno)synovitis, right ankle and foot: Secondary | ICD-10-CM | POA: Diagnosis not present

## 2024-05-01 DIAGNOSIS — I70201 Unspecified atherosclerosis of native arteries of extremities, right leg: Secondary | ICD-10-CM | POA: Diagnosis not present

## 2024-05-01 DIAGNOSIS — M86171 Other acute osteomyelitis, right ankle and foot: Secondary | ICD-10-CM | POA: Diagnosis not present

## 2024-05-01 DIAGNOSIS — I272 Pulmonary hypertension, unspecified: Secondary | ICD-10-CM | POA: Diagnosis not present

## 2024-05-01 DIAGNOSIS — Z4781 Encounter for orthopedic aftercare following surgical amputation: Secondary | ICD-10-CM | POA: Diagnosis not present

## 2024-05-01 DIAGNOSIS — I480 Paroxysmal atrial fibrillation: Secondary | ICD-10-CM | POA: Diagnosis not present

## 2024-05-01 DIAGNOSIS — Z89421 Acquired absence of other right toe(s): Secondary | ICD-10-CM | POA: Diagnosis not present

## 2024-05-01 DIAGNOSIS — I69351 Hemiplegia and hemiparesis following cerebral infarction affecting right dominant side: Secondary | ICD-10-CM | POA: Diagnosis not present

## 2024-05-03 NOTE — Progress Notes (Unsigned)
 Reason for Infectious Disease Consult: osteomyelitis in patient with diabetic foot ulcer  Requesting Physician: Ethan Saddler, DPM  PCP: Gaines Ada, NP  Subjective:    Patient ID: [name], male    DOB: Jul 23, 1936, 88 y.o.   MRN: [id number]  HPI  Discussed the use of AI scribe software for clinical note transcription with the patient, who gave verbal consent to proceed.  History of Present Illness   [name] is an 88 year old male with diabetes mellitus and peripheral vascular disease who presents with concerns of persistent elevated inflammatory markers following a partial fifth ray amputation of the right foot due to osteomyelitis. He was referred by podiatry due to persistent elevated inflammatory markers .  He has a history of diabetes mellitus and peripheral vascular disease, which led to the development of an ulcer on his right foot. This ulcer progressed to osteomyelitis, necessitating a partial fifth ray amputation of the right foot in May. Cultures from the bone revealed methicillin-resistant Staphylococcus aureus (MRSA), resistant to oxacillin.  Post-amputation, he was treated with doxycycline . Despite this, his inflammatory markers, including a sedimentation rate of 86 and a C-reactive protein level of 145, have remained elevated, which is higher than four months ago when the amputation was performed.  Recently, he experienced swelling, redness, and warmth in the affected foot, which extended upwards for the first time last Thursday. This episode resolved without antibiotics, but he noted tenderness to touch. No similar issues with the other foot, which is reportedly fine.  He occasionally takes Tylenol  for arthritis pain, which he describes as 'wear and tear' arthritis. No history of inflammatory arthritis or gout.       Past Medical History:  Diagnosis Date   Arthritis    Bundle branch block    Hypercholesteremia    Hyperlipidemia 2001   Hypertension 2001    Hypertension    Peripheral vascular disease    Stroke [facility])    Stroke [facility])    2001 - R side deficits    Past Surgical History:  Procedure Laterality Date   ABDOMINAL AORTOGRAM N/A 11/18/2019   Procedure: ABDOMINAL AORTOGRAM;  Surgeon: Dann Candyce RAMAN, MD;  Location: [facility] INVASIVE CV LAB;  Service: Cardiovascular;  Laterality: N/A;   AMPUTATION Right 12/11/2023   Procedure: Right foot partial fifth ray amputation;  Surgeon: Malvin Marsa FALCON, DPM;  Location: [facility] OR;  Service: Orthopedics/Podiatry;  Laterality: Right;   CORONARY ARTERY BYPASS GRAFT N/A 11/20/2019   Procedure: CORONARY ARTERY BYPASS GRAFTING (CABG), ON PUMP, TIMES THREE, USING LEFT INTERNAL MAMMARY ARTERY AND ENDOSCOPICALLY HARVESTED RIGHT GREATER SAPHENOUS VEIN;  Surgeon: German Bartlett PEDLAR, MD;  Location: [facility] OR;  Service: Open Heart Surgery;  Laterality: N/A;   LEFT HEART CATH AND CORONARY ANGIOGRAPHY N/A 11/18/2019   Procedure: LEFT HEART CATH AND CORONARY ANGIOGRAPHY;  Surgeon: Dann Candyce RAMAN, MD;  Location: [facility] INVASIVE CV LAB;  Service: Cardiovascular;  Laterality: N/A;   TEE WITHOUT CARDIOVERSION N/A 11/20/2019   Procedure: TRANSESOPHAGEAL ECHOCARDIOGRAM (TEE);  Surgeon: German Bartlett PEDLAR, MD;  Location: [facility] OR;  Service: Open Heart Surgery;  Laterality: N/A;    Family History  Problem Relation Age of Onset   Stroke Mother    Stroke Father    Heart attack Father    Cancer Brother       Social History   Socioeconomic History   Marital status: Single    Spouse name: Not on file   Number of children: Not on file  Years of education: Not on file   Highest education level: Not on file  Occupational History   Occupation: retired  Tobacco Use   Smoking status: Never   Smokeless tobacco: Never  Vaping Use   Vaping status: Not on file  Substance and Sexual Activity   Alcohol use: Never   Drug use: Never   Sexual activity: Not Currently  Other Topics Concern   Not on file  Social History  Narrative   ** Merged History Encounter **       Social Drivers of Health   Financial Resource Strain: Low Risk  (05/29/2023)   Overall Financial Resource Strain (CARDIA)    Difficulty of Paying Living Expenses: Not hard at all  Food Insecurity: No Food Insecurity (12/10/2023)   Hunger Vital Sign    Worried About Running Out of Food in the Last Year: Never true    Ran Out of Food in the Last Year: Never true  Transportation Needs: No Transportation Needs (12/10/2023)   PRAPARE - Administrator, Civil Service (Medical): No    Lack of Transportation (Non-Medical): No  Physical Activity: Insufficiently Active (05/29/2023)   Exercise Vital Sign    Days of Exercise per Week: 3 days    Minutes of Exercise per Session: 20 min  Stress: No Stress Concern Present (05/29/2023)   [employer] of Occupational Health - Occupational Stress Questionnaire    Feeling of Stress : Not at all  Social Connections: Socially Isolated (12/10/2023)   Social Connection and Isolation Panel    Frequency of Communication with Friends and Family: More than three times a week    Frequency of Social Gatherings with Friends and Family: More than three times a week    Attends Religious Services: Never    Database administrator or Organizations: No    Attends Engineer, structural: Never    Marital Status: Never married    No Known Allergies   Current Outpatient Medications:    acetaminophen  (TYLENOL ) 325 MG tablet, Take 2 tablets (650 mg total) by mouth every 4 (four) hours as needed for headache or mild pain., Disp:  , Rfl:    amLODipine  (NORVASC ) 2.5 MG tablet, Take 1 tablet (2.5 mg total) by mouth daily., Disp: 90 tablet, Rfl: 1   aspirin  EC 81 MG tablet, Take 81 mg by mouth daily., Disp: , Rfl:    cephALEXin  (KEFLEX ) 500 MG capsule, Take 1 capsule (500 mg total) by mouth 2 (two) times daily. (Patient not taking: Reported on 04/30/2024), Disp: 14 capsule, Rfl: 0   Cholecalciferol (VITAMIN  D) 50 MCG (2000 UT) tablet, Take 2,000 Units by mouth daily., Disp: , Rfl:    clopidogrel  (PLAVIX ) 75 MG tablet, Take 1 tablet (75 mg total) by mouth daily., Disp: 90 tablet, Rfl: 1   fluticasone  (FLONASE ) 50 MCG/ACT nasal spray, Place 2 sprays into both nostrils daily., Disp: 16 g, Rfl: 2   methocarbamol  (ROBAXIN ) 500 MG tablet, Take 1 tablet (500 mg total) by mouth 3 (three) times daily., Disp: , Rfl:    Misc Natural Products (ELDERBERRY IMMUNE COMPLEX PO), Take 1 capsule by mouth daily., Disp: , Rfl:    montelukast  (SINGULAIR ) 10 MG tablet, Take 1 tablet by mouth daily, Disp: 90 tablet, Rfl: 1   polyethylene glycol (MIRALAX  / GLYCOLAX ) 17 g packet, Take 17 g by mouth daily., Disp: 14 each, Rfl: 0   rosuvastatin  (CRESTOR ) 20 MG tablet, Take 1 tablet (20 mg total) by mouth daily.,  Disp: 30 tablet, Rfl: 1   tamsulosin  (FLOMAX ) 0.4 MG CAPS capsule, Take 1 capsule (0.4 mg total) by mouth daily., Disp: 90 capsule, Rfl: 1   tamsulosin  (FLOMAX ) 0.4 MG CAPS capsule, Take 1 capsule (0.4 mg total) by mouth daily., Disp: 90 capsule, Rfl: 1   zinc gluconate 50 MG tablet, Take 50 mg by mouth daily., Disp: , Rfl:    Review of Systems  Constitutional:  Negative for activity change, appetite change, chills, diaphoresis, fatigue, fever and unexpected weight change.  HENT:  Negative for congestion, rhinorrhea, sinus pressure, sneezing, sore throat and trouble swallowing.   Eyes:  Negative for photophobia and visual disturbance.  Respiratory:  Negative for cough, chest tightness, shortness of breath, wheezing and stridor.   Cardiovascular:  Negative for chest pain, palpitations and leg swelling.  Gastrointestinal:  Negative for abdominal distention, abdominal pain, anal bleeding, blood in stool, constipation, diarrhea, nausea and vomiting.  Genitourinary:  Negative for difficulty urinating, dysuria, flank pain and hematuria.  Musculoskeletal:  Negative for arthralgias, back pain, gait problem, joint swelling and  myalgias.  Skin:  Negative for color change, pallor, rash and wound.  Neurological:  Negative for dizziness, tremors, weakness and light-headedness.  Hematological:  Negative for adenopathy. Does not bruise/bleed easily.  Psychiatric/Behavioral:  Negative for agitation, behavioral problems, confusion, decreased concentration, dysphoric mood and sleep disturbance.        Objective:   Physical Exam Constitutional:      Appearance: He is well-developed.  HENT:     Head: Normocephalic and atraumatic.  Eyes:     Conjunctiva/sclera: Conjunctivae normal.  Cardiovascular:     Rate and Rhythm: Normal rate and regular rhythm.  Pulmonary:     Effort: Pulmonary effort is normal. No respiratory distress.     Breath sounds: No wheezing.  Abdominal:     General: There is no distension.     Palpations: Abdomen is soft.  Musculoskeletal:        General: No tenderness. Normal range of motion.     Cervical back: Normal range of motion and neck supple.  Skin:    General: Skin is warm and dry.     Coloration: Skin is not pale.     Findings: No erythema or rash.  Neurological:     General: No focal deficit present.     Mental Status: He is alert and oriented to person, place, and time.  Psychiatric:        Mood and Affect: Mood normal.        Behavior: Behavior normal.        Thought Content: Thought content normal.        Judgment: Judgment normal.           Assessment & Plan:   Assessment and Plan    Osteomyelitis of right foot post-partial fifth ray amputation Persistent elevated inflammatory markers suggest ongoing or recurrent osteomyelitis.    MRI required to evaluate bone infection status. - Order MRI of right foot with contrast to evaluate for osteomyelitis. - Order metabolic panel to assess kidney function prior to MRI with contrast. - Prescribe Zyvox (linezolid) in case foot swells and he has erythema going up the leg like he did last Thursday - Coordinate with surgical  team if MRI indicates further intervention is needed.      I have personally spent 60 minutes involved in face-to-face and non-face-to-face activities for this patient on the day of the visit. Professional time spent includes the following activities: Preparing  to see the patient (review of tests), Obtaining and/or reviewing separately obtained history (admission/discharge record), Performing a medically appropriate examination and/or evaluation , Ordering medications/tests/procedures, referring and communicating with other health care professionals, Documenting clinical information in the EMR, Independently interpreting results (not separately reported), Communicating results to the patient/family/caregiver, Counseling and educating the patient/family/caregiver and Care coordination (not separately reported).

## 2024-05-04 ENCOUNTER — Encounter: Payer: Self-pay | Admitting: Infectious Disease

## 2024-05-04 ENCOUNTER — Ambulatory Visit: Admitting: Infectious Disease

## 2024-05-04 ENCOUNTER — Other Ambulatory Visit: Payer: Self-pay

## 2024-05-04 VITALS — BP 152/73 | HR 65 | Temp 97.6°F

## 2024-05-04 DIAGNOSIS — I739 Peripheral vascular disease, unspecified: Secondary | ICD-10-CM

## 2024-05-04 DIAGNOSIS — H903 Sensorineural hearing loss, bilateral: Secondary | ICD-10-CM | POA: Diagnosis not present

## 2024-05-04 DIAGNOSIS — A4902 Methicillin resistant Staphylococcus aureus infection, unspecified site: Secondary | ICD-10-CM | POA: Diagnosis not present

## 2024-05-04 DIAGNOSIS — M86171 Other acute osteomyelitis, right ankle and foot: Secondary | ICD-10-CM | POA: Diagnosis not present

## 2024-05-04 DIAGNOSIS — R700 Elevated erythrocyte sedimentation rate: Secondary | ICD-10-CM

## 2024-05-04 DIAGNOSIS — D75839 Thrombocytosis, unspecified: Secondary | ICD-10-CM

## 2024-05-04 DIAGNOSIS — M869 Osteomyelitis, unspecified: Secondary | ICD-10-CM | POA: Diagnosis not present

## 2024-05-04 DIAGNOSIS — I480 Paroxysmal atrial fibrillation: Secondary | ICD-10-CM

## 2024-05-04 MED ORDER — LINEZOLID 600 MG PO TABS: 600.0000 mg | ORAL_TABLET | Freq: Two times a day (BID) | ORAL | 0 refills | Status: AC

## 2024-05-05 DIAGNOSIS — E43 Unspecified severe protein-calorie malnutrition: Secondary | ICD-10-CM | POA: Diagnosis not present

## 2024-05-05 DIAGNOSIS — M86171 Other acute osteomyelitis, right ankle and foot: Secondary | ICD-10-CM | POA: Diagnosis not present

## 2024-05-05 DIAGNOSIS — I70201 Unspecified atherosclerosis of native arteries of extremities, right leg: Secondary | ICD-10-CM | POA: Diagnosis not present

## 2024-05-05 DIAGNOSIS — I272 Pulmonary hypertension, unspecified: Secondary | ICD-10-CM | POA: Diagnosis not present

## 2024-05-05 DIAGNOSIS — I69351 Hemiplegia and hemiparesis following cerebral infarction affecting right dominant side: Secondary | ICD-10-CM | POA: Diagnosis not present

## 2024-05-05 DIAGNOSIS — Z89421 Acquired absence of other right toe(s): Secondary | ICD-10-CM | POA: Diagnosis not present

## 2024-05-05 DIAGNOSIS — Z4781 Encounter for orthopedic aftercare following surgical amputation: Secondary | ICD-10-CM | POA: Diagnosis not present

## 2024-05-05 DIAGNOSIS — I480 Paroxysmal atrial fibrillation: Secondary | ICD-10-CM | POA: Diagnosis not present

## 2024-05-05 DIAGNOSIS — M65171 Other infective (teno)synovitis, right ankle and foot: Secondary | ICD-10-CM | POA: Diagnosis not present

## 2024-05-05 LAB — CBC WITH DIFFERENTIAL/PLATELET
Absolute Lymphocytes: 1619 {cells}/uL (ref 850–3900)
Absolute Monocytes: 1157 {cells}/uL — ABNORMAL HIGH (ref 200–950)
Basophils Absolute: 71 {cells}/uL (ref 0–200)
Basophils Relative: 1 %
Eosinophils Absolute: 334 {cells}/uL (ref 15–500)
Eosinophils Relative: 4.7 %
HCT: 35.4 % — ABNORMAL LOW (ref 38.5–50.0)
Hemoglobin: 11.6 g/dL — ABNORMAL LOW (ref 13.2–17.1)
MCH: 29.2 pg (ref 27.0–33.0)
MCHC: 32.8 g/dL (ref 32.0–36.0)
MCV: 89.2 fL (ref 80.0–100.0)
MPV: 9.8 fL (ref 7.5–12.5)
Monocytes Relative: 16.3 %
Neutro Abs: 3919 {cells}/uL (ref 1500–7800)
Neutrophils Relative %: 55.2 %
Platelets: 442 Thousand/uL — ABNORMAL HIGH (ref 140–400)
RBC: 3.97 Million/uL — ABNORMAL LOW (ref 4.20–5.80)
RDW: 18.1 % — ABNORMAL HIGH (ref 11.0–15.0)
Total Lymphocyte: 22.8 %
WBC: 7.1 Thousand/uL (ref 3.8–10.8)

## 2024-05-05 LAB — SEDIMENTATION RATE: Sed Rate: 51 mm/h — ABNORMAL HIGH (ref 0–20)

## 2024-05-05 LAB — C-REACTIVE PROTEIN: CRP: 52.1 mg/L — ABNORMAL HIGH (ref <8.0)

## 2024-05-05 LAB — BASIC METABOLIC PANEL WITHOUT GFR
BUN: 13 mg/dL (ref 7–25)
CO2: 26 mmol/L (ref 20–32)
Calcium: 9.2 mg/dL (ref 8.6–10.3)
Chloride: 105 mmol/L (ref 98–110)
Creat: 0.88 mg/dL (ref 0.70–1.22)
Glucose, Bld: 98 mg/dL (ref 65–99)
Potassium: 4.2 mmol/L (ref 3.5–5.3)
Sodium: 138 mmol/L (ref 135–146)

## 2024-05-08 DIAGNOSIS — M65171 Other infective (teno)synovitis, right ankle and foot: Secondary | ICD-10-CM | POA: Diagnosis not present

## 2024-05-08 DIAGNOSIS — E43 Unspecified severe protein-calorie malnutrition: Secondary | ICD-10-CM | POA: Diagnosis not present

## 2024-05-08 DIAGNOSIS — I70201 Unspecified atherosclerosis of native arteries of extremities, right leg: Secondary | ICD-10-CM | POA: Diagnosis not present

## 2024-05-08 DIAGNOSIS — Z4781 Encounter for orthopedic aftercare following surgical amputation: Secondary | ICD-10-CM | POA: Diagnosis not present

## 2024-05-08 DIAGNOSIS — I69351 Hemiplegia and hemiparesis following cerebral infarction affecting right dominant side: Secondary | ICD-10-CM | POA: Diagnosis not present

## 2024-05-08 DIAGNOSIS — I272 Pulmonary hypertension, unspecified: Secondary | ICD-10-CM | POA: Diagnosis not present

## 2024-05-08 DIAGNOSIS — I480 Paroxysmal atrial fibrillation: Secondary | ICD-10-CM | POA: Diagnosis not present

## 2024-05-08 DIAGNOSIS — Z89421 Acquired absence of other right toe(s): Secondary | ICD-10-CM | POA: Diagnosis not present

## 2024-05-08 DIAGNOSIS — M86171 Other acute osteomyelitis, right ankle and foot: Secondary | ICD-10-CM | POA: Diagnosis not present

## 2024-05-14 ENCOUNTER — Ambulatory Visit (HOSPITAL_COMMUNITY)
Admission: RE | Admit: 2024-05-14 | Discharge: 2024-05-14 | Disposition: A | Discharge status: Home / Self Care | Source: Ambulatory Visit | Attending: Infectious Disease | Admitting: Infectious Disease

## 2024-05-14 ENCOUNTER — Ambulatory Visit: Admitting: Podiatry

## 2024-05-14 DIAGNOSIS — L97512 Non-pressure chronic ulcer of other part of right foot with fat layer exposed: Secondary | ICD-10-CM | POA: Diagnosis not present

## 2024-05-14 DIAGNOSIS — I739 Peripheral vascular disease, unspecified: Secondary | ICD-10-CM | POA: Diagnosis not present

## 2024-05-14 DIAGNOSIS — R600 Localized edema: Secondary | ICD-10-CM | POA: Diagnosis not present

## 2024-05-14 DIAGNOSIS — Z9889 Other specified postprocedural states: Secondary | ICD-10-CM

## 2024-05-14 DIAGNOSIS — M869 Osteomyelitis, unspecified: Secondary | ICD-10-CM | POA: Diagnosis not present

## 2024-05-14 DIAGNOSIS — M86171 Other acute osteomyelitis, right ankle and foot: Secondary | ICD-10-CM | POA: Diagnosis not present

## 2024-05-14 DIAGNOSIS — M799 Soft tissue disorder, unspecified: Secondary | ICD-10-CM | POA: Diagnosis not present

## 2024-05-14 DIAGNOSIS — M19071 Primary osteoarthritis, right ankle and foot: Secondary | ICD-10-CM | POA: Diagnosis not present

## 2024-05-14 MED ORDER — GADOBUTROL 1 MMOL/ML IV SOLN
7.5000 mL | Freq: Once | INTRAVENOUS | Status: AC | PRN
  Administered 2024-05-14: 7.5 mL via INTRAVENOUS

## 2024-05-14 NOTE — Progress Notes (Signed)
 Subjective:  Patient ID: [name], male    DOB: 1935-12-29,  MRN: [id number]   DOS: 12/11/2023 Procedure: 1.  Partial fifth ray amputation with excision of ulceration, right foot 2.  Application dissolvable antibiotic beads, right foot 3.  Application dermal allograft, 38 cm, right foot  88 y.o. male seen for post op check.   He is with his son.    Reports he saw Dr. Fleeta Rothman on 29 September.  Concern for elevated inflammatory markers.  Plan for MRI today.  Has been getting Betadine and Band-Aid style dressing changes from home health.  Reports no drainage.  Per his son the edema comes and goes.  Review of Systems: Negative except as noted in the HPI. Denies N/V/F/Ch.   Objective:   Constitutional Well developed. Well nourished.  Vascular Foot warm and well perfused. Capillary refill normal to all digits.   No calf pain with palpation  Neurologic Normal speech. Oriented to person, place, and time. Epicritic sensation diminished to right foot  Dermatologic Amp site with near complete healing along the amputation line at this point in time.  There is a small eschar that was debrided no open wounds underlying approximately very superficial breakdown of skin distally though nearly fully healed.  Still with some edema of the foot and ankle may be slightly better than prior   Orthopedic: Status post right partial fifth ray amputation   Radiographs: Amputation of fifth ray at the proximal aspect of the fifth metatarsal  03/19/2024 XR 3 views AP lateral oblique of the right foot.  Findings: Attention directed to the fifth metatarsal base at the resection margin there does not appear to be acute osseous erosions seen on lateral or AP views.  Appears to be relatively sharp clean margin.  Mild periosteal reaction of the fourth metatarsal however no active erosions noted there.  Per Dr. Dempsey note: 04/15/2024 XR 3 views AP lateral oblique of the right foot. Findings: Attention directed  to the fifth metatarsal base at the resection this appears unchanged from previous without any signs of osteolysis or cortical erosion. Previously noted periosteal reaction to the fourth metatarsal appears unchanged from previous  Pathology: A. TOE, RIGHT FIFTH, AMPUTATION:  Ulceration with acute inflammation and acute osteomyelitis.   B. BONE, RIGHT FIFTH, PROXIMAL, MARGIN:  Benign bone and connective tissue.  Negative for osteomyelitis.   Micro:  Staph aureus   Assessment:   Osteomyelitis of right fifth ray status post right partial fifth ray amputation  Plan:  Patient was evaluated and treated and all questions answered.  5 months s/p partial fifth ray amputation right foot -Progressing with ongoing wound care, the wound is continue to improve and nearly fully healed debrided some eschar overlying appears healthy underneath  -Overall continues to improve after debridement  -Recommend daily antibiotic ointment and Band-Aid style dressing change to the wound  -Appreciate Dr. Kenton recommendations plan for MRI today.  Will discuss results when patient follows up in 2 weeks to determine if surgical intervention is required -Patient will also be following up with vascular surgery later in October given slow wound healing and abnormal noninvasive vascular testing -WB Status: Weightbearing as tolerated in regular shoe gear -Sutures: Previously removed -Medications/ABX: ABX per infectious disease -Dressing:   right foot dressing to include antibiotic ointment and Band-Aid style dressing - F/u Plan: Follow-up in 2 weeks for wound check         [name], DPM [facility] / [facility]

## 2024-05-15 DIAGNOSIS — M86171 Other acute osteomyelitis, right ankle and foot: Secondary | ICD-10-CM | POA: Diagnosis not present

## 2024-05-15 DIAGNOSIS — Z89421 Acquired absence of other right toe(s): Secondary | ICD-10-CM | POA: Diagnosis not present

## 2024-05-15 DIAGNOSIS — M65171 Other infective (teno)synovitis, right ankle and foot: Secondary | ICD-10-CM | POA: Diagnosis not present

## 2024-05-15 DIAGNOSIS — Z4781 Encounter for orthopedic aftercare following surgical amputation: Secondary | ICD-10-CM | POA: Diagnosis not present

## 2024-05-15 DIAGNOSIS — I272 Pulmonary hypertension, unspecified: Secondary | ICD-10-CM | POA: Diagnosis not present

## 2024-05-15 DIAGNOSIS — I70201 Unspecified atherosclerosis of native arteries of extremities, right leg: Secondary | ICD-10-CM | POA: Diagnosis not present

## 2024-05-15 DIAGNOSIS — I480 Paroxysmal atrial fibrillation: Secondary | ICD-10-CM | POA: Diagnosis not present

## 2024-05-15 DIAGNOSIS — E43 Unspecified severe protein-calorie malnutrition: Secondary | ICD-10-CM | POA: Diagnosis not present

## 2024-05-15 DIAGNOSIS — I69351 Hemiplegia and hemiparesis following cerebral infarction affecting right dominant side: Secondary | ICD-10-CM | POA: Diagnosis not present

## 2024-05-18 ENCOUNTER — Ambulatory Visit: Payer: Self-pay

## 2024-05-22 DIAGNOSIS — I69351 Hemiplegia and hemiparesis following cerebral infarction affecting right dominant side: Secondary | ICD-10-CM | POA: Diagnosis not present

## 2024-05-22 DIAGNOSIS — Z4781 Encounter for orthopedic aftercare following surgical amputation: Secondary | ICD-10-CM | POA: Diagnosis not present

## 2024-05-22 DIAGNOSIS — I480 Paroxysmal atrial fibrillation: Secondary | ICD-10-CM | POA: Diagnosis not present

## 2024-05-22 DIAGNOSIS — M86171 Other acute osteomyelitis, right ankle and foot: Secondary | ICD-10-CM | POA: Diagnosis not present

## 2024-05-22 DIAGNOSIS — Z89421 Acquired absence of other right toe(s): Secondary | ICD-10-CM | POA: Diagnosis not present

## 2024-05-22 DIAGNOSIS — I272 Pulmonary hypertension, unspecified: Secondary | ICD-10-CM | POA: Diagnosis not present

## 2024-05-22 DIAGNOSIS — E43 Unspecified severe protein-calorie malnutrition: Secondary | ICD-10-CM | POA: Diagnosis not present

## 2024-05-22 DIAGNOSIS — I70201 Unspecified atherosclerosis of native arteries of extremities, right leg: Secondary | ICD-10-CM | POA: Diagnosis not present

## 2024-05-22 DIAGNOSIS — M65171 Other infective (teno)synovitis, right ankle and foot: Secondary | ICD-10-CM | POA: Diagnosis not present

## 2024-05-25 ENCOUNTER — Ambulatory Visit

## 2024-05-25 ENCOUNTER — Encounter (HOSPITAL_COMMUNITY)

## 2024-05-28 ENCOUNTER — Encounter: Payer: Self-pay | Admitting: Podiatry

## 2024-05-28 ENCOUNTER — Ambulatory Visit (INDEPENDENT_AMBULATORY_CARE_PROVIDER_SITE_OTHER): Admitting: Podiatry

## 2024-05-28 DIAGNOSIS — M65171 Other infective (teno)synovitis, right ankle and foot: Secondary | ICD-10-CM | POA: Diagnosis not present

## 2024-05-28 DIAGNOSIS — I272 Pulmonary hypertension, unspecified: Secondary | ICD-10-CM | POA: Diagnosis not present

## 2024-05-28 DIAGNOSIS — Z9889 Other specified postprocedural states: Secondary | ICD-10-CM | POA: Diagnosis not present

## 2024-05-28 DIAGNOSIS — M869 Osteomyelitis, unspecified: Secondary | ICD-10-CM

## 2024-05-28 DIAGNOSIS — E43 Unspecified severe protein-calorie malnutrition: Secondary | ICD-10-CM | POA: Diagnosis not present

## 2024-05-28 DIAGNOSIS — I69351 Hemiplegia and hemiparesis following cerebral infarction affecting right dominant side: Secondary | ICD-10-CM | POA: Diagnosis not present

## 2024-05-28 DIAGNOSIS — Z89421 Acquired absence of other right toe(s): Secondary | ICD-10-CM | POA: Diagnosis not present

## 2024-05-28 DIAGNOSIS — M86171 Other acute osteomyelitis, right ankle and foot: Secondary | ICD-10-CM | POA: Diagnosis not present

## 2024-05-28 DIAGNOSIS — L97512 Non-pressure chronic ulcer of other part of right foot with fat layer exposed: Secondary | ICD-10-CM

## 2024-05-28 DIAGNOSIS — I739 Peripheral vascular disease, unspecified: Secondary | ICD-10-CM | POA: Diagnosis not present

## 2024-05-28 DIAGNOSIS — I70201 Unspecified atherosclerosis of native arteries of extremities, right leg: Secondary | ICD-10-CM | POA: Diagnosis not present

## 2024-05-28 DIAGNOSIS — I480 Paroxysmal atrial fibrillation: Secondary | ICD-10-CM | POA: Diagnosis not present

## 2024-05-28 DIAGNOSIS — Z4781 Encounter for orthopedic aftercare following surgical amputation: Secondary | ICD-10-CM | POA: Diagnosis not present

## 2024-05-28 NOTE — Progress Notes (Signed)
 Subjective:  Patient ID: [name], male    DOB: 1935/12/19,  MRN: [id number]   DOS: 12/11/2023 Procedure: 1.  Partial fifth ray amputation with excision of ulceration, right foot 2.  Application dissolvable antibiotic beads, right foot 3.  Application dermal allograft, 38 cm, right foot  88 y.o. male seen for post op check.  Patient doing well denies pain says the swelling comes and goes on his right foot.  Overall feeling well wearing regular shoes bilaterally has his brace on his right side.  Review of Systems: Negative except as noted in the HPI. Denies N/V/F/Ch.   Objective:   Constitutional Well developed. Well nourished.  Vascular Foot warm and well perfused. Capillary refill normal to all digits.   No calf pain with palpation  Neurologic Normal speech. Oriented to person, place, and time. Epicritic sensation diminished to right foot  Dermatologic Amp site  fully healed, eschar at the distal aspect of the amputation site though appears to be healing and completely underneath.  No drainage maceration or erythema.  No open wound       Orthopedic: Status post right partial fifth ray amputation   Radiographs: Amputation of fifth ray at the proximal aspect of the fifth metatarsal  03/19/2024 XR 3 views AP lateral oblique of the right foot.  Findings: Attention directed to the fifth metatarsal base at the resection margin there does not appear to be acute osseous erosions seen on lateral or AP views.  Appears to be relatively sharp clean margin.  Mild periosteal reaction of the fourth metatarsal however no active erosions noted there.  Per Dr. Dempsey note: 04/15/2024 XR 3 views AP lateral oblique of the right foot. Findings: Attention directed to the fifth metatarsal base at the resection this appears unchanged from previous without any signs of osteolysis or cortical erosion. Previously noted periosteal reaction to the fourth metatarsal appears unchanged from  previous  Pathology: A. TOE, RIGHT FIFTH, AMPUTATION:  Ulceration with acute inflammation and acute osteomyelitis.   B. BONE, RIGHT FIFTH, PROXIMAL, MARGIN:  Benign bone and connective tissue.  Negative for osteomyelitis.   Micro:  Staph aureus   Assessment:   Osteomyelitis of right fifth ray status post right partial fifth ray amputation  Plan:  Patient was evaluated and treated and all questions answered.   s/p partial fifth ray amputation right foot, going wound care right foot amputation site, concern for possible osteomyelitis fourth metatarsal -Progressing with ongoing wound care, the wound is continue to improve and nearly fully healed   -Overall continues to improve after debridement of eschar over the area -Recommend  washing foot prn with soapy water  and monitoring for changes -Had a discussion with the patient's family member-William Fennell's cousin-over the phone earlier this week.  We discussed the MRI findings with concern for possible osteomyelitis fourth metatarsal head and neck area versus reactive changes as well as his clinical state.  Patient does not wish to have any further surgery.  I discussed as the wound is nearly fully healed and he is not having any significant clinical findings in the foot at this time aside from occasional edema we will monitor without any surgical intervention. -WB Status: Weightbearing as tolerated in regular shoe gear -Sutures: Previously removed -Medications/ABX: ABX per infectious disease -Dressing:  no further wound care needed, leave eschars alone and can gently remove if flaking off, ok to wash feet prn - F/u Plan: Follow-up in 4 weeks for wound check  Marolyn JULIANNA Honour, DPM [facility] / [facility]

## 2024-06-03 DIAGNOSIS — Z89421 Acquired absence of other right toe(s): Secondary | ICD-10-CM | POA: Diagnosis not present

## 2024-06-03 DIAGNOSIS — I70201 Unspecified atherosclerosis of native arteries of extremities, right leg: Secondary | ICD-10-CM | POA: Diagnosis not present

## 2024-06-03 DIAGNOSIS — I272 Pulmonary hypertension, unspecified: Secondary | ICD-10-CM | POA: Diagnosis not present

## 2024-06-03 DIAGNOSIS — I69351 Hemiplegia and hemiparesis following cerebral infarction affecting right dominant side: Secondary | ICD-10-CM | POA: Diagnosis not present

## 2024-06-03 DIAGNOSIS — M86171 Other acute osteomyelitis, right ankle and foot: Secondary | ICD-10-CM | POA: Diagnosis not present

## 2024-06-03 DIAGNOSIS — Z4781 Encounter for orthopedic aftercare following surgical amputation: Secondary | ICD-10-CM | POA: Diagnosis not present

## 2024-06-03 DIAGNOSIS — E43 Unspecified severe protein-calorie malnutrition: Secondary | ICD-10-CM | POA: Diagnosis not present

## 2024-06-03 DIAGNOSIS — M65171 Other infective (teno)synovitis, right ankle and foot: Secondary | ICD-10-CM | POA: Diagnosis not present

## 2024-06-03 DIAGNOSIS — I480 Paroxysmal atrial fibrillation: Secondary | ICD-10-CM | POA: Diagnosis not present

## 2024-06-09 ENCOUNTER — Other Ambulatory Visit: Payer: Self-pay

## 2024-06-09 ENCOUNTER — Ambulatory Visit: Payer: Self-pay | Admitting: Infectious Disease

## 2024-06-09 VITALS — BP 149/68 | HR 70 | Temp 97.6°F | Wt 169.0 lb

## 2024-06-09 DIAGNOSIS — M869 Osteomyelitis, unspecified: Secondary | ICD-10-CM

## 2024-06-09 DIAGNOSIS — Z7185 Encounter for immunization safety counseling: Secondary | ICD-10-CM

## 2024-06-09 NOTE — Progress Notes (Signed)
 Subjective:  Chief complaint: followup for osteomyelitis   Patient ID: [name], male    DOB: 16-May-1936, 88 y.o.   MRN: [id number]  HPI  Discussed the use of AI scribe software for clinical note transcription with the patient, who gave verbal consent to proceed.  History of Present Illness   [name] is an 88 year old male with diabetes mellitus and peripheral vascular disease who presents with persistently elevated inflammatory markers following a partial fifth ray amputation of the right foot due to osteomyelitis. He was referred by another provider for evaluation of persistently elevated inflammatory markers post-amputation.  He underwent a partial fifth ray amputation of the right foot due to osteomyelitis. Postoperatively, he was treated with doxycycline , but inflammatory markers remained elevated, with a sedimentation rate of 86 and C-reactive protein of 145. He experienced swelling, redness, and warmth in the affected foot, which extended upwards for the first time on April 30, 2024.  An MRI performed on May 14, 2024, was reviewed by the radiologist and podiatrist. MRI suggested he has osteomyelitis in the FOURTH metatarsal and neck. He did not want surgery and. Dr Malvin felt observation without intervention was appropriate  He has been closely followed by podiatry. Swelling in the foot fluctuates, sometimes resolving overnight, with no persistent soreness or redness. He is able to walk, and the swelling decreases with time. No persistent soreness or redness in the foot.      Past Medical History:  Diagnosis Date   Arthritis    Bundle branch block    Hypercholesteremia    Hyperlipidemia 2001   Hypertension 2001   Hypertension    Peripheral vascular disease    Stroke [facility])    Stroke [facility])    2001 - R side deficits    Past Surgical History:  Procedure Laterality Date   ABDOMINAL AORTOGRAM N/A 11/18/2019   Procedure: ABDOMINAL AORTOGRAM;  Surgeon:  Dann Candyce RAMAN, MD;  Location: [facility] INVASIVE CV LAB;  Service: Cardiovascular;  Laterality: N/A;   AMPUTATION Right 12/11/2023   Procedure: Right foot partial fifth ray amputation;  Surgeon: Malvin Marsa FALCON, DPM;  Location: [facility] OR;  Service: Orthopedics/Podiatry;  Laterality: Right;   CORONARY ARTERY BYPASS GRAFT N/A 11/20/2019   Procedure: CORONARY ARTERY BYPASS GRAFTING (CABG), ON PUMP, TIMES THREE, USING LEFT INTERNAL MAMMARY ARTERY AND ENDOSCOPICALLY HARVESTED RIGHT GREATER SAPHENOUS VEIN;  Surgeon: German Bartlett PEDLAR, MD;  Location: [facility] OR;  Service: Open Heart Surgery;  Laterality: N/A;   LEFT HEART CATH AND CORONARY ANGIOGRAPHY N/A 11/18/2019   Procedure: LEFT HEART CATH AND CORONARY ANGIOGRAPHY;  Surgeon: Dann Candyce RAMAN, MD;  Location: [facility] INVASIVE CV LAB;  Service: Cardiovascular;  Laterality: N/A;   TEE WITHOUT CARDIOVERSION N/A 11/20/2019   Procedure: TRANSESOPHAGEAL ECHOCARDIOGRAM (TEE);  Surgeon: German Bartlett PEDLAR, MD;  Location: [facility] OR;  Service: Open Heart Surgery;  Laterality: N/A;    Family History  Problem Relation Age of Onset   Stroke Mother    Stroke Father    Heart attack Father    Cancer Brother       Social History   Socioeconomic History   Marital status: Single    Spouse name: Not on file   Number of children: Not on file   Years of education: Not on file   Highest education level: Not on file  Occupational History   Occupation: retired  Tobacco Use   Smoking status: Never   Smokeless tobacco: Never  Vaping Use   Vaping  status: Not on file  Substance and Sexual Activity   Alcohol use: Never   Drug use: Never   Sexual activity: Not Currently  Other Topics Concern   Not on file  Social History Narrative   ** Merged History Encounter **       Social Drivers of Health   Financial Resource Strain: Low Risk  (05/29/2023)   Overall Financial Resource Strain (CARDIA)    Difficulty of Paying Living Expenses: Not hard at all  Food Insecurity:  No Food Insecurity (12/10/2023)   Hunger Vital Sign    Worried About Running Out of Food in the Last Year: Never true    Ran Out of Food in the Last Year: Never true  Transportation Needs: No Transportation Needs (12/10/2023)   PRAPARE - Administrator, Civil Service (Medical): No    Lack of Transportation (Non-Medical): No  Physical Activity: Insufficiently Active (05/29/2023)   Exercise Vital Sign    Days of Exercise per Week: 3 days    Minutes of Exercise per Session: 20 min  Stress: No Stress Concern Present (05/29/2023)   [employer] of Occupational Health - Occupational Stress Questionnaire    Feeling of Stress : Not at all  Social Connections: Socially Isolated (12/10/2023)   Social Connection and Isolation Panel    Frequency of Communication with Friends and Family: More than three times a week    Frequency of Social Gatherings with Friends and Family: More than three times a week    Attends Religious Services: Never    Database Administrator or Organizations: No    Attends Engineer, Structural: Never    Marital Status: Never married    No Known Allergies   Current Outpatient Medications:    acetaminophen  (TYLENOL ) 325 MG tablet, Take 2 tablets (650 mg total) by mouth every 4 (four) hours as needed for headache or mild pain., Disp:  , Rfl:    amLODipine  (NORVASC ) 2.5 MG tablet, Take 1 tablet (2.5 mg total) by mouth daily., Disp: 90 tablet, Rfl: 1   aspirin  EC 81 MG tablet, Take 81 mg by mouth daily., Disp: , Rfl:    cephALEXin  (KEFLEX ) 500 MG capsule, Take 1 capsule (500 mg total) by mouth 2 (two) times daily., Disp: 14 capsule, Rfl: 0   Cholecalciferol (VITAMIN D) 50 MCG (2000 UT) tablet, Take 2,000 Units by mouth daily., Disp: , Rfl:    clopidogrel  (PLAVIX ) 75 MG tablet, Take 1 tablet (75 mg total) by mouth daily., Disp: 90 tablet, Rfl: 1   fluticasone  (FLONASE ) 50 MCG/ACT nasal spray, Place 2 sprays into both nostrils daily., Disp: 16 g, Rfl: 2    linezolid (ZYVOX) 600 MG tablet, Take 1 tablet (600 mg total) by mouth 2 (two) times daily. To start if redness spreads up leg again, Disp: 20 tablet, Rfl: 0   methocarbamol  (ROBAXIN ) 500 MG tablet, Take 1 tablet (500 mg total) by mouth 3 (three) times daily., Disp: , Rfl:    Misc Natural Products (ELDERBERRY IMMUNE COMPLEX PO), Take 1 capsule by mouth daily., Disp: , Rfl:    montelukast  (SINGULAIR ) 10 MG tablet, Take 1 tablet by mouth daily, Disp: 90 tablet, Rfl: 1   polyethylene glycol (MIRALAX  / GLYCOLAX ) 17 g packet, Take 17 g by mouth daily., Disp: 14 each, Rfl: 0   rosuvastatin  (CRESTOR ) 20 MG tablet, Take 1 tablet (20 mg total) by mouth daily., Disp: 30 tablet, Rfl: 1   tamsulosin  (FLOMAX ) 0.4 MG CAPS capsule, Take  1 capsule (0.4 mg total) by mouth daily., Disp: 90 capsule, Rfl: 1   tamsulosin  (FLOMAX ) 0.4 MG CAPS capsule, Take 1 capsule (0.4 mg total) by mouth daily., Disp: 90 capsule, Rfl: 1   zinc gluconate 50 MG tablet, Take 50 mg by mouth daily., Disp: , Rfl:    Review of Systems  Constitutional:  Negative for activity change, appetite change, chills, diaphoresis, fatigue, fever and unexpected weight change.  HENT:  Negative for congestion, rhinorrhea, sinus pressure, sneezing, sore throat and trouble swallowing.   Eyes:  Negative for photophobia and visual disturbance.  Respiratory:  Negative for cough, chest tightness, shortness of breath, wheezing and stridor.   Cardiovascular:  Negative for chest pain, palpitations and leg swelling.  Gastrointestinal:  Negative for abdominal distention, abdominal pain, anal bleeding, blood in stool, constipation, diarrhea, nausea and vomiting.  Genitourinary:  Negative for difficulty urinating, dysuria, flank pain and hematuria.  Musculoskeletal:  Negative for arthralgias, back pain, gait problem, joint swelling and myalgias.  Skin:  Negative for color change, pallor, rash and wound.  Neurological:  Negative for dizziness, tremors, weakness and  light-headedness.  Hematological:  Negative for adenopathy. Does not bruise/bleed easily.  Psychiatric/Behavioral:  Negative for agitation, behavioral problems, confusion, decreased concentration, dysphoric mood and sleep disturbance.        Objective:   Physical Exam Constitutional:      Appearance: He is well-developed.  HENT:     Head: Normocephalic and atraumatic.  Eyes:     Conjunctiva/sclera: Conjunctivae normal.  Cardiovascular:     Rate and Rhythm: Normal rate and regular rhythm.  Pulmonary:     Effort: Pulmonary effort is normal. No respiratory distress.     Breath sounds: No wheezing.  Abdominal:     General: There is no distension.     Palpations: Abdomen is soft.  Musculoskeletal:        General: No tenderness. Normal range of motion.     Cervical back: Normal range of motion and neck supple.  Skin:    General: Skin is warm and dry.     Coloration: Skin is not pale.     Findings: No erythema or rash.  Neurological:     General: No focal deficit present.     Mental Status: He is alert and oriented to person, place, and time.  Psychiatric:        Mood and Affect: Mood normal.        Behavior: Behavior normal.        Thought Content: Thought content normal.        Judgment: Judgment normal.    Right foot         Assessment & Plan:   Assessment and Plan    Likely osteomyelitis of right fourth metatarsal MRI indicated pronounced marrow edema and enhancement of the fourth metatarsal head and neck, suggestive of early acute osteomyelitis.  - Checked inflammatory markers. - Continue monitoring with podiatry. and see us  as prn --I gree there is no role for antibiotic therapy at this point unless we are doing aggressive surgery to cure this or he is symptomatic  Status post partial fifth ray amputation, right foot Postoperative changes noted at proximal fifth metatarsal. Healing progressing well, no infection signs. - Continue monitoring healing process  with podiatry.      Recommended COVID vaccine but he would prefer to see PCP tomorrow to discuss

## 2024-06-10 ENCOUNTER — Ambulatory Visit: Payer: Self-pay | Admitting: Nurse Practitioner

## 2024-06-10 ENCOUNTER — Encounter: Payer: Self-pay | Admitting: Nurse Practitioner

## 2024-06-10 ENCOUNTER — Ambulatory Visit (INDEPENDENT_AMBULATORY_CARE_PROVIDER_SITE_OTHER): Payer: Medicare PPO

## 2024-06-10 VITALS — BP 126/60 | HR 82 | Temp 97.6°F | Ht 71.0 in | Wt 172.0 lb

## 2024-06-10 DIAGNOSIS — I480 Paroxysmal atrial fibrillation: Secondary | ICD-10-CM | POA: Diagnosis not present

## 2024-06-10 DIAGNOSIS — R7303 Prediabetes: Secondary | ICD-10-CM | POA: Diagnosis not present

## 2024-06-10 DIAGNOSIS — I69351 Hemiplegia and hemiparesis following cerebral infarction affecting right dominant side: Secondary | ICD-10-CM

## 2024-06-10 DIAGNOSIS — I10 Essential (primary) hypertension: Secondary | ICD-10-CM

## 2024-06-10 DIAGNOSIS — Z23 Encounter for immunization: Secondary | ICD-10-CM | POA: Diagnosis not present

## 2024-06-10 DIAGNOSIS — I2510 Atherosclerotic heart disease of native coronary artery without angina pectoris: Secondary | ICD-10-CM

## 2024-06-10 DIAGNOSIS — Z0000 Encounter for general adult medical examination without abnormal findings: Secondary | ICD-10-CM

## 2024-06-10 DIAGNOSIS — I119 Hypertensive heart disease without heart failure: Secondary | ICD-10-CM | POA: Diagnosis not present

## 2024-06-10 LAB — C-REACTIVE PROTEIN: CRP: 9.9 mg/L — ABNORMAL HIGH (ref <8.0)

## 2024-06-10 LAB — SEDIMENTATION RATE: Sed Rate: 34 mm/h — ABNORMAL HIGH (ref 0–20)

## 2024-06-10 NOTE — Progress Notes (Signed)
 LILLETTE Kristeen JINNY Gladis, CMA,acting as a neurosurgeon for Gaines Ada, FNP.,have documented all relevant documentation on the behalf of Gaines Ada, FNP,as directed by  Gaines Ada, FNP while in the presence of Gaines Ada, FNP.  Subjective:  Patient ID: [name] , male    DOB: June 01, 1936 , 87 y.o.   MRN: [id number]  Chief Complaint  Patient presents with   Hypertension    Patient presents today for a bp follow up, Patient reports compliance with medication. Patient denies any chest pain, SOB, or headaches. Patient has no concerns today.      HPI  Discussed the use of AI scribe software for clinical note transcription with the patient, who gave verbal consent to proceed.  History of Present Illness [name] is an 88 year old male who presents for follow-up on blood pressure management and post-amputation care.  His blood pressure was recently noted to be elevated. He is under the care of Dr. Jimmy and Dr. Fleeta Rothman and has a follow-up appointment with Dr. Jimmy on the 20th of this month.  Regarding post-amputation care, the incision has healed well. An MRI previously indicated a small infection in one of his bones, but no further operations were required. Swelling has decreased, and he is able to wear his shoe.  He has not experienced any falls recently. He had an eye examination earlier this year and may need another check-up. He occasionally uses glasses, but they have been missing since last week.  He received a flu shot today. He had his second pneumonia vaccine dose on May 29, 2023.  His hemoglobin level increased slightly from 11.4 to 11.6. He does not have diabetes, but a test result was slightly elevated, though not significant. He does not use salt in his diet.   Past Medical History:  Diagnosis Date   Arthritis    Bundle branch block    Hypercholesteremia    Hyperlipidemia 2001   Hypertension 2001   Hypertension    Peripheral vascular disease    Stroke [facility])     Stroke (HCC)    2001 - R side deficits     Family History  Problem Relation Age of Onset   Stroke Mother    Stroke Father    Heart attack Father    Cancer Brother      Current Outpatient Medications:    acetaminophen  (TYLENOL ) 325 MG tablet, Take 2 tablets (650 mg total) by mouth every 4 (four) hours as needed for headache or mild pain., Disp:  , Rfl:    amLODipine  (NORVASC ) 2.5 MG tablet, Take 1 tablet (2.5 mg total) by mouth daily., Disp: 90 tablet, Rfl: 1   aspirin  EC 81 MG tablet, Take 81 mg by mouth daily., Disp: , Rfl:    Cholecalciferol (VITAMIN D) 50 MCG (2000 UT) tablet, Take 2,000 Units by mouth daily., Disp: , Rfl:    fluticasone  (FLONASE ) 50 MCG/ACT nasal spray, Place 2 sprays into both nostrils daily., Disp: 16 g, Rfl: 2   methocarbamol  (ROBAXIN ) 500 MG tablet, Take 1 tablet (500 mg total) by mouth 3 (three) times daily., Disp: , Rfl:    Misc Natural Products (ELDERBERRY IMMUNE COMPLEX PO), Take 1 capsule by mouth daily., Disp: , Rfl:    montelukast  (SINGULAIR ) 10 MG tablet, Take 1 tablet by mouth daily, Disp: 90 tablet, Rfl: 1   polyethylene glycol (MIRALAX  / GLYCOLAX ) 17 g packet, Take 17 g by mouth daily., Disp: 14 each, Rfl: 0   rosuvastatin  (CRESTOR )  20 MG tablet, Take 1 tablet (20 mg total) by mouth daily., Disp: 30 tablet, Rfl: 1   tamsulosin  (FLOMAX ) 0.4 MG CAPS capsule, Take 1 capsule (0.4 mg total) by mouth daily., Disp: 90 capsule, Rfl: 1   tamsulosin  (FLOMAX ) 0.4 MG CAPS capsule, Take 1 capsule (0.4 mg total) by mouth daily., Disp: 90 capsule, Rfl: 1   zinc gluconate 50 MG tablet, Take 50 mg by mouth daily., Disp: , Rfl:    cephALEXin  (KEFLEX ) 500 MG capsule, Take 1 capsule (500 mg total) by mouth 2 (two) times daily. (Patient not taking: Reported on 06/10/2024), Disp: 14 capsule, Rfl: 0   clopidogrel  (PLAVIX ) 75 MG tablet, TAKE 1 TABLET(75 MG) BY MOUTH DAILY, Disp: 90 tablet, Rfl: 1   linezolid (ZYVOX) 600 MG tablet, Take 1 tablet (600 mg total) by mouth 2  (two) times daily. To start if redness spreads up leg again (Patient not taking: Reported on 06/10/2024), Disp: 20 tablet, Rfl: 0   No Known Allergies   Review of Systems  Constitutional: Negative.   Respiratory: Negative.  Negative for shortness of breath and wheezing.   Cardiovascular: Negative.  Negative for chest pain and palpitations.  Gastrointestinal: Negative.   Neurological: Negative.  Negative for headaches.  Psychiatric/Behavioral: Negative.       Today's Vitals   06/10/24 0921  BP: 126/60  Pulse: 82  Temp: 97.6 F (36.4 C)  TempSrc: Oral  Weight: 172 lb (78 kg)  Height: 5' 11 (1.803 m)  PainSc: 0-No pain   Body mass index is 23.99 kg/m.  Wt Readings from Last 3 Encounters:  06/10/24 172 lb (78 kg)  06/10/24 172 lb (78 kg)  06/09/24 169 lb (76.7 kg)     Objective:  Physical Exam Vitals and nursing note reviewed.  Constitutional:      General: He is not in acute distress.    Appearance: Normal appearance. He is obese.  HENT:     Head: Normocephalic.  Eyes:     Conjunctiva/sclera:     Left eye: Hemorrhage present.  Cardiovascular:     Rate and Rhythm: Normal rate and regular rhythm.     Pulses: Normal pulses.     Heart sounds: Normal heart sounds. No murmur heard. Pulmonary:     Effort: Pulmonary effort is normal. No respiratory distress.     Breath sounds: Normal breath sounds. No wheezing.  Musculoskeletal:        General: Normal range of motion.     Comments: Right arm and leg in brace  Skin:    General: Skin is warm and dry.     Capillary Refill: Capillary refill takes less than 2 seconds.     Findings: No bruising.  Neurological:     General: No focal deficit present.     Mental Status: He is alert and oriented to person, place, and time.     Cranial Nerves: No cranial nerve deficit.     Motor: No weakness.  Psychiatric:        Mood and Affect: Mood normal.        Behavior: Behavior normal.        Thought Content: Thought content normal.         Judgment: Judgment normal.        Assessment And Plan:  Hypertensive heart disease without heart failure Assessment & Plan: Blood pressure elevated. Swelling improved. - Continue current antihypertensive regimen.   Hemiparesis affecting right side as late effect of cerebrovascular accident [facility]) [I69.351]  Prediabetes Assessment & Plan: HgbA1c increased slightly at last visit, encouraged to continue to eat a diet low in sugar.    Atherosclerosis of native coronary artery of native heart without angina pectoris  Paroxysmal atrial fibrillation [facility]) Assessment & Plan: Continue f/u with Cardiology     Return for KEEP SAME NEXT.  Patient was given opportunity to ask questions. Patient verbalized understanding of the plan and was able to repeat key elements of the plan. All questions were answered to their satisfaction.    LILLETTE Gaines Ada, FNP, have reviewed all documentation for this visit. The documentation on 06/10/24 for the exam, diagnosis, procedures, and orders are all accurate and complete.   IF YOU HAVE BEEN REFERRED TO A SPECIALIST, IT MAY TAKE 1-2 WEEKS TO SCHEDULE/PROCESS THE REFERRAL. IF YOU HAVE NOT HEARD FROM US /SPECIALIST IN TWO WEEKS, PLEASE GIVE US  A CALL AT [phone].

## 2024-06-10 NOTE — Patient Instructions (Signed)
 [name],  Thank you for taking the time for your Medicare Wellness Visit. I appreciate your continued commitment to your health goals. Please review the care plan we discussed, and feel free to reach out if I can assist you further.  Please note that Annual Wellness Visits do not include a physical exam. Some assessments may be limited, especially if the visit was conducted virtually. If needed, we may recommend an in-person follow-up with your provider.  Ongoing Care Seeing your primary care provider every 3 to 6 months helps us  monitor your health and provide consistent, personalized care.   Referrals If a referral was made during today's visit and you haven't received any updates within two weeks, please contact the referred provider directly to check on the status.  Recommended Screenings:  Health Maintenance  Topic Date Due   Complete foot exam   Never done   Zoster (Shingles) Vaccine (1 of 2) Never done   Eye exam for diabetics  06/02/2022   Flu Shot  03/06/2024   COVID-19 Vaccine (5 - 2025-26 season) 04/06/2024   Medicare Annual Wellness Visit  05/28/2024   Hemoglobin A1C  08/23/2024   DTaP/Tdap/Td vaccine (3 - Td or Tdap) 11/02/2029   Pneumococcal Vaccine for age over 88  Completed   Meningitis B Vaccine  Aged Out       06/10/2024    9:12 AM  Advanced Directives  Does Patient Have a Medical Advance Directive? Yes  Type of Advance Directive Healthcare Power of Attorney  Copy of Healthcare Power of Attorney in Chart? No - copy requested    Vision: Annual vision screenings are recommended for early detection of glaucoma, cataracts, and diabetic retinopathy. These exams can also reveal signs of chronic conditions such as diabetes and high blood pressure.  Dental: Annual dental screenings help detect early signs of oral cancer, gum disease, and other conditions linked to overall health, including heart disease and diabetes.  Please see the attached documents for  additional preventive care recommendations.

## 2024-06-10 NOTE — Progress Notes (Signed)
 Subjective:   [name] is a 88 y.o. male who presents for a Medicare Annual Wellness Visit.  Allergies (verified) Patient has no known allergies.   History: Past Medical History:  Diagnosis Date   Arthritis    Bundle branch block    Hypercholesteremia    Hyperlipidemia 2001   Hypertension 2001   Hypertension    Peripheral vascular disease    Stroke [facility])    Stroke [facility])    2001 - R side deficits   Past Surgical History:  Procedure Laterality Date   ABDOMINAL AORTOGRAM N/A 11/18/2019   Procedure: ABDOMINAL AORTOGRAM;  Surgeon: Dann Candyce RAMAN, MD;  Location: [facility] INVASIVE CV LAB;  Service: Cardiovascular;  Laterality: N/A;   AMPUTATION Right 12/11/2023   Procedure: Right foot partial fifth ray amputation;  Surgeon: Malvin Marsa FALCON, DPM;  Location: [facility] OR;  Service: Orthopedics/Podiatry;  Laterality: Right;   CORONARY ARTERY BYPASS GRAFT N/A 11/20/2019   Procedure: CORONARY ARTERY BYPASS GRAFTING (CABG), ON PUMP, TIMES THREE, USING LEFT INTERNAL MAMMARY ARTERY AND ENDOSCOPICALLY HARVESTED RIGHT GREATER SAPHENOUS VEIN;  Surgeon: German Bartlett PEDLAR, MD;  Location: [facility] OR;  Service: Open Heart Surgery;  Laterality: N/A;   LEFT HEART CATH AND CORONARY ANGIOGRAPHY N/A 11/18/2019   Procedure: LEFT HEART CATH AND CORONARY ANGIOGRAPHY;  Surgeon: Dann Candyce RAMAN, MD;  Location: [facility] INVAS[facility] CV LAB;  Service: Cardiovascular;  Laterality: N/A;   TEE WITHOUT CARDIOVERSION N/A 11/20/2019   Procedure: TRANSESOPHAGEAL ECHOCARDIOGRAM (TEE);  Surgeon: German Bartlett PEDLAR, MD;  Location: [facility] OR;  Service: Open Heart Surgery;  Laterality: N/A;   Family History  Problem Relation Age of Onset   Stroke Mother    Stroke Father    Heart attack Father    Cancer Brother    Social History   Occupational History   Occupation: retired  Tobacco Use   Smoking status: Never   Smokeless tobacco: Never  Vaping Use   Vaping status: Not on file  Substance and Sexual Activity   Alcohol use:  Never   Drug use: Never   Sexual activity: Not Currently   Tobacco Counseling Counseling given: Not Answered  SDOH Screenings   Food Insecurity: No Food Insecurity (06/10/2024)  Housing: Unknown (06/10/2024)  Transportation Needs: No Transportation Needs (06/10/2024)  Utilities: Not At Risk (06/10/2024)  Alcohol Screen: Low Risk  (06/10/2024)  Depression (PHQ2-9): Low Risk  (06/10/2024)  Financial Resource Strain: Low Risk  (06/10/2024)  Physical Activity: Inactive (06/10/2024)  Social Connections: Socially Isolated (06/10/2024)  Stress: No Stress Concern Present (06/10/2024)  Tobacco Use: Low Risk  (06/10/2024)  Health Literacy: Inadequate Health Literacy (06/10/2024)   Depression Screen    06/10/2024    9:09 AM 08/28/2023    2:22 PM 05/29/2023    8:44 AM 09/03/2022    3:29 PM 03/21/2022    4:00 PM 02/23/2021   10:07 AM 02/18/2020   11:27 AM  PHQ 2/9 Scores  PHQ - 2 Score 0 0 0 0 0 0 0  PHQ- 9 Score 0 0          Goals Addressed             This Visit's Progress    Patient Stated       06/10/2024, wants to walk better       Visit info / Clinical Intake: Medicare Wellness Visit Type:: Subsequent Annual Wellness Visit Medicare Wellness Visit Mode:: In-person (required for WTM) Interpreter Needed?: No Pre-visit prep was completed: yes AWV questionnaire completed by  patient prior to visit?: no Living arrangements:: (!) lives alone Patient's Overall Health Status Rating: good Typical amount of pain: none Does pain affect daily life?: no Are you currently prescribed opioids?: no  Dietary Habits and Nutritional Risks How many meals a day?: 3 Eats fruit and vegetables daily?: yes Most meals are obtained by: preparing own meals; having others provide food Diabetic:: no  Functional Status Activities of Daily Living (to include ambulation/medication): Independent Ambulation: Independent with device- listed below Home Assistive Devices/Equipment: Cane; Brace (specify  type) Medication Administration: Needs assistance (comment) (daughter sets up weekly) Home Management: Independent Manage your own finances?: (!) no (son manages) Primary transportation is: family/friends Concerns about vision?: no *vision screening is required for WTM* Concerns about hearing?: (!) yes Uses hearing aids?: no Hear whispered voice?: (!) no *in-person visit only*  Fall Screening Falls in the past year?: 0 Number of falls in past year: 0 Was there an injury with Fall?: 0 Fall Risk Category Calculator: 0 Patient Fall Risk Level: Low Fall Risk  Fall Risk Patient at Risk for Falls Due to: Medication side effect; Impaired mobility; Impaired balance/gait Fall risk Follow up: Falls prevention discussed; Falls evaluation completed  Home and Transportation Safety: All rugs have non-skid backing?: yes All stairs or steps have railings?: N/A, no stairs (has a ramp) Grab bars in the bathtub or shower?: yes Have non-skid surface in bathtub or shower?: (!) no Good home lighting?: yes Regular seat belt use?: yes Hospital stays in the last year:: (!) yes How many hospital stays:: 1 Reason: toe amputation  Cognitive Assessment Difficulty concentrating, remembering, or making decisions? : yes Will 6CIT or Mini Cog be Completed: no 6CIT or Mini Cog Declined: patient has a diagnosis of dementia or cognitive impairment  Advance Directives (For Healthcare) Does Patient Have a Medical Advance Directive?: Yes Type of Advance Directive: Healthcare Power of Attorney Copy of Healthcare Power of Attorney in Chart?: No - copy requested Would patient like information on creating a medical advance directive?: No - Patient declined  Reviewed/Updated  Reviewed/Updated: All        Objective:    Today's Vitals   06/10/24 0906  BP: 126/60  Pulse: 82  Temp: 97.6 F (36.4 C)  TempSrc: Oral  SpO2: 97%  Weight: 172 lb (78 kg)  Height: 5' 11 (1.803 m)   Body mass index is 23.99  kg/m.  Current Medications (verified) Outpatient Encounter Medications as of 06/10/2024  Medication Sig   acetaminophen  (TYLENOL ) 325 MG tablet Take 2 tablets (650 mg total) by mouth every 4 (four) hours as needed for headache or mild pain.   amLODipine  (NORVASC ) 2.5 MG tablet Take 1 tablet (2.5 mg total) by mouth daily.   aspirin  EC 81 MG tablet Take 81 mg by mouth daily.   Cholecalciferol (VITAMIN D) 50 MCG (2000 UT) tablet Take 2,000 Units by mouth daily.   clopidogrel  (PLAVIX ) 75 MG tablet Take 1 tablet (75 mg total) by mouth daily.   fluticasone  (FLONASE ) 50 MCG/ACT nasal spray Place 2 sprays into both nostrils daily.   methocarbamol  (ROBAXIN ) 500 MG tablet Take 1 tablet (500 mg total) by mouth 3 (three) times daily.   Misc Natural Products (ELDERBERRY IMMUNE COMPLEX PO) Take 1 capsule by mouth daily.   montelukast  (SINGULAIR ) 10 MG tablet Take 1 tablet by mouth daily   polyethylene glycol (MIRALAX  / GLYCOLAX ) 17 g packet Take 17 g by mouth daily.   rosuvastatin  (CRESTOR ) 20 MG tablet Take 1 tablet (20 mg total)  by mouth daily.   tamsulosin  (FLOMAX ) 0.4 MG CAPS capsule Take 1 capsule (0.4 mg total) by mouth daily.   tamsulosin  (FLOMAX ) 0.4 MG CAPS capsule Take 1 capsule (0.4 mg total) by mouth daily.   zinc gluconate 50 MG tablet Take 50 mg by mouth daily.   cephALEXin  (KEFLEX ) 500 MG capsule Take 1 capsule (500 mg total) by mouth 2 (two) times daily. (Patient not taking: Reported on 06/10/2024)   linezolid (ZYVOX) 600 MG tablet Take 1 tablet (600 mg total) by mouth 2 (two) times daily. To start if redness spreads up leg again (Patient not taking: Reported on 06/10/2024)   No facility-administered encounter medications on file as of 06/10/2024.   Hearing/Vision screen Hearing Screening - Comments:: Trouble hearing, but no hearing aids Vision Screening - Comments:: Regular eye exams, [facility] Immunizations and Health Maintenance Health Maintenance  Topic Date Due   COVID-19 Vaccine  (5 - 2025-26 season) 06/26/2024 (Originally 04/06/2024)   Zoster Vaccines- Shingrix  (1 of 2) 09/10/2024 (Originally 09/20/1954)   Medicare Annual Wellness (AWV)  06/10/2025   DTaP/Tdap/Td (3 - Td or Tdap) 11/02/2029   Pneumococcal Vaccine: 50+ Years  Completed   Influenza Vaccine  Completed   Meningococcal B Vaccine  Aged Out        Assessment/Plan:  This is a routine wellness examination for Daltin.  Patient Care Team: Georgina Speaks, FNP as PCP - General (General Practice) Claudene Victory ORN, MD (Inactive) as PCP - Cardiology (Cardiology) Cleatus Collar, MD as Consulting Physician (Ophthalmology) [facility], Rande, MD as Attending Physician Rollin Dover, MD as Consulting Physician (Gastroenterology) Georgina Speaks, FNP (General Practice)  I have personally reviewed and noted the following in the patient's chart:   Medical and social history Use of alcohol, tobacco or illicit drugs  Current medications and supplements including opioid prescriptions. Functional ability and status Nutritional status Physical activity Advanced directives List of other physicians Hospitalizations, surgeries, and ER visits in previous 12 months Vitals Screenings to include cognitive, depression, and falls Referrals and appointments  Orders Placed This Encounter  Procedures   Flu vaccine HIGH DOSE PF(Fluzone Trivalent)   In addition, I have reviewed and discussed with patient certain preventive protocols, quality metrics, and best practice recommendations. A written personalized care plan for preventive services as well as general preventive health recommendations were provided to patient.   Ardella FORBES Dawn, LPN   88/11/7972   Return in 1 year (on 06/10/2025).  After Visit Summary: (In Person-Printed) AVS printed and given to the patient  Nurse Notes: none

## 2024-06-13 DIAGNOSIS — I69351 Hemiplegia and hemiparesis following cerebral infarction affecting right dominant side: Secondary | ICD-10-CM | POA: Diagnosis not present

## 2024-06-13 DIAGNOSIS — M86171 Other acute osteomyelitis, right ankle and foot: Secondary | ICD-10-CM | POA: Diagnosis not present

## 2024-06-13 DIAGNOSIS — Z4781 Encounter for orthopedic aftercare following surgical amputation: Secondary | ICD-10-CM | POA: Diagnosis not present

## 2024-06-13 DIAGNOSIS — Z89421 Acquired absence of other right toe(s): Secondary | ICD-10-CM | POA: Diagnosis not present

## 2024-06-13 DIAGNOSIS — E43 Unspecified severe protein-calorie malnutrition: Secondary | ICD-10-CM | POA: Diagnosis not present

## 2024-06-13 DIAGNOSIS — I480 Paroxysmal atrial fibrillation: Secondary | ICD-10-CM | POA: Diagnosis not present

## 2024-06-13 DIAGNOSIS — I272 Pulmonary hypertension, unspecified: Secondary | ICD-10-CM | POA: Diagnosis not present

## 2024-06-13 DIAGNOSIS — M65171 Other infective (teno)synovitis, right ankle and foot: Secondary | ICD-10-CM | POA: Diagnosis not present

## 2024-06-13 DIAGNOSIS — I70201 Unspecified atherosclerosis of native arteries of extremities, right leg: Secondary | ICD-10-CM | POA: Diagnosis not present

## 2024-06-14 ENCOUNTER — Other Ambulatory Visit: Payer: Self-pay | Admitting: Nurse Practitioner

## 2024-06-17 NOTE — Assessment & Plan Note (Signed)
 HgbA1c increased slightly at last visit, encouraged to continue to eat a diet low in sugar.

## 2024-06-17 NOTE — Assessment & Plan Note (Signed)
 Blood pressure elevated. Swelling improved. - Continue current antihypertensive regimen.

## 2024-06-17 NOTE — Assessment & Plan Note (Signed)
Continue f/u with Cardiology.

## 2024-06-19 DIAGNOSIS — I272 Pulmonary hypertension, unspecified: Secondary | ICD-10-CM | POA: Diagnosis not present

## 2024-06-19 DIAGNOSIS — Z89421 Acquired absence of other right toe(s): Secondary | ICD-10-CM | POA: Diagnosis not present

## 2024-06-19 DIAGNOSIS — I69351 Hemiplegia and hemiparesis following cerebral infarction affecting right dominant side: Secondary | ICD-10-CM | POA: Diagnosis not present

## 2024-06-19 DIAGNOSIS — I480 Paroxysmal atrial fibrillation: Secondary | ICD-10-CM | POA: Diagnosis not present

## 2024-06-19 DIAGNOSIS — Z4781 Encounter for orthopedic aftercare following surgical amputation: Secondary | ICD-10-CM | POA: Diagnosis not present

## 2024-06-19 DIAGNOSIS — M86171 Other acute osteomyelitis, right ankle and foot: Secondary | ICD-10-CM | POA: Diagnosis not present

## 2024-06-19 DIAGNOSIS — I70201 Unspecified atherosclerosis of native arteries of extremities, right leg: Secondary | ICD-10-CM | POA: Diagnosis not present

## 2024-06-19 DIAGNOSIS — E43 Unspecified severe protein-calorie malnutrition: Secondary | ICD-10-CM | POA: Diagnosis not present

## 2024-06-19 DIAGNOSIS — M65171 Other infective (teno)synovitis, right ankle and foot: Secondary | ICD-10-CM | POA: Diagnosis not present

## 2024-06-25 ENCOUNTER — Encounter: Payer: Self-pay | Admitting: Podiatry

## 2024-06-25 ENCOUNTER — Ambulatory Visit: Admitting: Podiatry

## 2024-06-25 DIAGNOSIS — I739 Peripheral vascular disease, unspecified: Secondary | ICD-10-CM | POA: Diagnosis not present

## 2024-06-25 DIAGNOSIS — M86171 Other acute osteomyelitis, right ankle and foot: Secondary | ICD-10-CM | POA: Diagnosis not present

## 2024-06-25 DIAGNOSIS — Z9889 Other specified postprocedural states: Secondary | ICD-10-CM | POA: Diagnosis not present

## 2024-06-25 NOTE — Progress Notes (Signed)
  Subjective:  Patient ID: [name], male    DOB: 02-04-1936,  MRN: [id number]   DOS: 12/11/2023 Procedure: 1.  Partial fifth ray amputation with excision of ulceration, right foot 2.  Application dissolvable antibiotic beads, right foot 3.  Application dermal allograft, 38 cm, right foot  88 y.o. male seen for post op check.  Patient doing well denies pain says the swelling comes and goes on his right foot.  Has more swelling issues than any pain at this point.  Wound is healed no drainage.  Review of Systems: Negative except as noted in the HPI. Denies N/V/F/Ch.   Objective:   Constitutional Well developed. Well nourished.  Vascular Foot warm and well perfused. Capillary refill normal to all digits.   No calf pain with palpation  Neurologic Normal speech. Oriented to person, place, and time. Epicritic sensation diminished to right foot  Dermatologic Amp site  fully healed no erythema or drainage     Orthopedic: Status post right partial fifth ray amputation   Radiographs: Amputation of fifth ray at the proximal aspect of the fifth metatarsal  03/19/2024 XR 3 views AP lateral oblique of the right foot.  Findings: Attention directed to the fifth metatarsal base at the resection margin there does not appear to be acute osseous erosions seen on lateral or AP views.  Appears to be relatively sharp clean margin.  Mild periosteal reaction of the fourth metatarsal however no active erosions noted there.  Per Dr. Dempsey note: 04/15/2024 XR 3 views AP lateral oblique of the right foot. Findings: Attention directed to the fifth metatarsal base at the resection this appears unchanged from previous without any signs of osteolysis or cortical erosion. Previously noted periosteal reaction to the fourth metatarsal appears unchanged from previous  Pathology: A. TOE, RIGHT FIFTH, AMPUTATION:  Ulceration with acute inflammation and acute osteomyelitis.   B. BONE, RIGHT FIFTH, PROXIMAL,  MARGIN:  Benign bone and connective tissue.  Negative for osteomyelitis.   Micro:  Staph aureus   Assessment:   Osteomyelitis of right fifth ray status post right partial fifth ray amputation  Plan:  Patient was evaluated and treated and all questions answered.   s/p partial fifth ray amputation right foot, going wound care right foot amputation site, concern for possible osteomyelitis fourth metatarsal - Now fully healed on the right foot partial fifth ray amp site no evidence of residual infection.  Washing foot prn with soapy water  and monitoring for changes -For edema recommend compression stockings -WB Status: Weightbearing as tolerated in regular shoe gear -Sutures: Previously removed -Medications/ABX: ABX per infectious disease, monitoring off antibiotics after discussion with Dr. Lindia -Dressing: No wound care required wound fully healed - F/u Plan: Follow-up as needed         Marolyn JULIANNA Honour, DPM [facility] / [facility]

## 2024-06-26 DIAGNOSIS — I70201 Unspecified atherosclerosis of native arteries of extremities, right leg: Secondary | ICD-10-CM | POA: Diagnosis not present

## 2024-06-26 DIAGNOSIS — M65171 Other infective (teno)synovitis, right ankle and foot: Secondary | ICD-10-CM | POA: Diagnosis not present

## 2024-06-26 DIAGNOSIS — Z4781 Encounter for orthopedic aftercare following surgical amputation: Secondary | ICD-10-CM | POA: Diagnosis not present

## 2024-06-26 DIAGNOSIS — I480 Paroxysmal atrial fibrillation: Secondary | ICD-10-CM | POA: Diagnosis not present

## 2024-06-26 DIAGNOSIS — I69351 Hemiplegia and hemiparesis following cerebral infarction affecting right dominant side: Secondary | ICD-10-CM | POA: Diagnosis not present

## 2024-06-26 DIAGNOSIS — Z89421 Acquired absence of other right toe(s): Secondary | ICD-10-CM | POA: Diagnosis not present

## 2024-06-26 DIAGNOSIS — M86171 Other acute osteomyelitis, right ankle and foot: Secondary | ICD-10-CM | POA: Diagnosis not present

## 2024-06-26 DIAGNOSIS — E43 Unspecified severe protein-calorie malnutrition: Secondary | ICD-10-CM | POA: Diagnosis not present

## 2024-06-26 DIAGNOSIS — I272 Pulmonary hypertension, unspecified: Secondary | ICD-10-CM | POA: Diagnosis not present

## 2024-07-03 DIAGNOSIS — I272 Pulmonary hypertension, unspecified: Secondary | ICD-10-CM | POA: Diagnosis not present

## 2024-07-03 DIAGNOSIS — I480 Paroxysmal atrial fibrillation: Secondary | ICD-10-CM | POA: Diagnosis not present

## 2024-07-03 DIAGNOSIS — Z89421 Acquired absence of other right toe(s): Secondary | ICD-10-CM | POA: Diagnosis not present

## 2024-07-03 DIAGNOSIS — I70201 Unspecified atherosclerosis of native arteries of extremities, right leg: Secondary | ICD-10-CM | POA: Diagnosis not present

## 2024-07-03 DIAGNOSIS — M86171 Other acute osteomyelitis, right ankle and foot: Secondary | ICD-10-CM | POA: Diagnosis not present

## 2024-07-03 DIAGNOSIS — M65171 Other infective (teno)synovitis, right ankle and foot: Secondary | ICD-10-CM | POA: Diagnosis not present

## 2024-07-03 DIAGNOSIS — E43 Unspecified severe protein-calorie malnutrition: Secondary | ICD-10-CM | POA: Diagnosis not present

## 2024-07-03 DIAGNOSIS — I69351 Hemiplegia and hemiparesis following cerebral infarction affecting right dominant side: Secondary | ICD-10-CM | POA: Diagnosis not present

## 2024-07-03 DIAGNOSIS — Z4781 Encounter for orthopedic aftercare following surgical amputation: Secondary | ICD-10-CM | POA: Diagnosis not present

## 2024-07-07 DIAGNOSIS — I480 Paroxysmal atrial fibrillation: Secondary | ICD-10-CM | POA: Diagnosis not present

## 2024-07-07 DIAGNOSIS — I272 Pulmonary hypertension, unspecified: Secondary | ICD-10-CM | POA: Diagnosis not present

## 2024-07-07 DIAGNOSIS — E43 Unspecified severe protein-calorie malnutrition: Secondary | ICD-10-CM | POA: Diagnosis not present

## 2024-07-07 DIAGNOSIS — I70201 Unspecified atherosclerosis of native arteries of extremities, right leg: Secondary | ICD-10-CM | POA: Diagnosis not present

## 2024-07-07 DIAGNOSIS — I69351 Hemiplegia and hemiparesis following cerebral infarction affecting right dominant side: Secondary | ICD-10-CM | POA: Diagnosis not present

## 2024-07-07 DIAGNOSIS — M65171 Other infective (teno)synovitis, right ankle and foot: Secondary | ICD-10-CM | POA: Diagnosis not present

## 2024-07-07 DIAGNOSIS — M86171 Other acute osteomyelitis, right ankle and foot: Secondary | ICD-10-CM | POA: Diagnosis not present

## 2024-07-07 DIAGNOSIS — Z89421 Acquired absence of other right toe(s): Secondary | ICD-10-CM | POA: Diagnosis not present

## 2024-07-07 DIAGNOSIS — Z4781 Encounter for orthopedic aftercare following surgical amputation: Secondary | ICD-10-CM | POA: Diagnosis not present

## 2024-07-08 ENCOUNTER — Other Ambulatory Visit: Payer: Self-pay | Admitting: Nurse Practitioner

## 2024-07-08 DIAGNOSIS — I70211 Atherosclerosis of native arteries of extremities with intermittent claudication, right leg: Secondary | ICD-10-CM

## 2024-07-08 DIAGNOSIS — M65171 Other infective (teno)synovitis, right ankle and foot: Secondary | ICD-10-CM

## 2024-07-08 DIAGNOSIS — Z89421 Acquired absence of other right toe(s): Secondary | ICD-10-CM

## 2024-07-08 DIAGNOSIS — Z4781 Encounter for orthopedic aftercare following surgical amputation: Secondary | ICD-10-CM

## 2024-07-08 DIAGNOSIS — I69351 Hemiplegia and hemiparesis following cerebral infarction affecting right dominant side: Secondary | ICD-10-CM

## 2024-07-08 DIAGNOSIS — I480 Paroxysmal atrial fibrillation: Secondary | ICD-10-CM

## 2024-07-08 DIAGNOSIS — M86171 Other acute osteomyelitis, right ankle and foot: Secondary | ICD-10-CM

## 2024-07-08 NOTE — Progress Notes (Unsigned)
 Chief Complaint  Patient presents with   home health recert   Received home health orders orders from [facility]. Start of care 01/12/2024.   Certification and orders from 05/11/2024 through 07/09/2024 are reviewed, signed and faxed back to home health company.  Need of intermittent skilled services at home: SN  The home health care plan has been established by me and will be reviewed and updated as needed to maximize patient recovery.  I certify that all home health services have been and will be furnished to the patient while under my care.  Face-to-face encounter in which the need for home health services was established: 06/10/2024  Patient is receiving home health services for the following diagnoses: Problem List Items Addressed This Visit       Cardiovascular and Mediastinum   Paroxysmal atrial fibrillation (HCC) - Primary     Musculoskeletal and Integument   Acute osteomyelitis of phalanx of right foot (HCC)   Other Visit Diagnoses       Hemiparesis affecting right side as late effect of cerebrovascular accident (HCC) [I69.351]         Other infective (teno)synovitis, right ankle and foot [M65.171]         Status post amputation of toe of right foot [Z89.421]         Encounter for orthopedic aftercare following surgical amputation [Z47.81]         Atherosclerosis of native artery of right lower extremity with intermittent claudication [I70.211]            Gaines Ada, FNP

## 2024-07-13 ENCOUNTER — Other Ambulatory Visit: Payer: Self-pay | Admitting: Nurse Practitioner

## 2024-07-20 ENCOUNTER — Ambulatory Visit (HOSPITAL_COMMUNITY): Admission: RE | Admit: 2024-07-20 | Discharge: 2024-07-20 | Attending: Surgery | Admitting: Surgery

## 2024-07-20 ENCOUNTER — Ambulatory Visit

## 2024-07-20 VITALS — BP 166/69 | HR 57 | Temp 97.7°F | Wt 172.0 lb

## 2024-07-20 DIAGNOSIS — M7989 Other specified soft tissue disorders: Secondary | ICD-10-CM

## 2024-07-20 DIAGNOSIS — I739 Peripheral vascular disease, unspecified: Secondary | ICD-10-CM

## 2024-07-20 LAB — VAS US ABI WITH/WO TBI
Left ABI: 1.03
Right ABI: 0.51

## 2024-07-20 NOTE — Progress Notes (Signed)
 Office Note   History of Present Illness   [name] is a 88 y.o. ([birth year]) male who presents for follow-up.  He has a history of PAD with known occlusion of his right common and external iliac artery, which was seen on angiography in April 2021.  He has no prior history of vascular interventions.   At his last visit with our office he was still healing from a recent partial fifth ray amputation due to osteomyelitis on 12/11/2023.  He returns today for follow-up. He was last seen by podiatry a month ago and his amputation site on the right foot was well-healed.  He denies any further tissue loss since then.  He also denies any rest pain or claudication. He walks a little bit around his house. He endorses daily right leg swelling from the ankle to the knee that gets worse as the day goes on. He wears compression stockings. He does not elevate his legs above his heart.  Current Outpatient Medications  Medication Sig Dispense Refill   acetaminophen  (TYLENOL ) 325 MG tablet Take 2 tablets (650 mg total) by mouth every 4 (four) hours as needed for headache or mild pain.     amLODipine  (NORVASC ) 2.5 MG tablet Take 1 tablet (2.5 mg total) by mouth daily. 90 tablet 1   aspirin  EC 81 MG tablet Take 81 mg by mouth daily.     cephALEXin  (KEFLEX ) 500 MG capsule Take 1 capsule (500 mg total) by mouth 2 (two) times daily. (Patient not taking: Reported on 06/25/2024) 14 capsule 0   Cholecalciferol (VITAMIN D) 50 MCG (2000 UT) tablet Take 2,000 Units by mouth daily.     clopidogrel  (PLAVIX ) 75 MG tablet TAKE 1 TABLET(75 MG) BY MOUTH DAILY 90 tablet 1   fluticasone  (FLONASE ) 50 MCG/ACT nasal spray Place 2 sprays into both nostrils daily. 16 g 2   linezolid  (ZYVOX ) 600 MG tablet Take 1 tablet (600 mg total) by mouth 2 (two) times daily. To start if redness spreads up leg again (Patient not taking: Reported on 06/25/2024) 20 tablet 0   methocarbamol  (ROBAXIN ) 500 MG tablet Take 1 tablet (500 mg total) by  mouth 3 (three) times daily.     Misc Natural Products (ELDERBERRY IMMUNE COMPLEX PO) Take 1 capsule by mouth daily.     montelukast  (SINGULAIR ) 10 MG tablet Take 1 tablet by mouth daily 90 tablet 1   polyethylene glycol (MIRALAX  / GLYCOLAX ) 17 g packet Take 17 g by mouth daily. 14 each 0   rosuvastatin  (CRESTOR ) 20 MG tablet TAKE 1 TABLET(20 MG) BY MOUTH DAILY 30 tablet 1   tamsulosin  (FLOMAX ) 0.4 MG CAPS capsule Take 1 capsule (0.4 mg total) by mouth daily. 90 capsule 1   tamsulosin  (FLOMAX ) 0.4 MG CAPS capsule Take 1 capsule (0.4 mg total) by mouth daily. 90 capsule 1   zinc gluconate 50 MG tablet Take 50 mg by mouth daily.     No current facility-administered medications for this visit.    REVIEW OF SYSTEMS (negative unless checked):   Cardiac:  []  Chest pain or chest pressure? []  Shortness of breath upon activity? []  Shortness of breath when lying flat? []  Irregular heart rhythm?  Vascular:  []  Pain in calf, thigh, or hip brought on by walking? []  Pain in feet at night that wakes you up from your sleep? []  Blood clot in your veins? [x]  Leg swelling?  Pulmonary:  []  Oxygen at home? []  Productive cough? []  Wheezing?  Neurologic:  []   Sudden weakness in arms or legs? []  Sudden numbness in arms or legs? []  Sudden onset of difficult speaking or slurred speech? []  Temporary loss of vision in one eye? []  Problems with dizziness?  Gastrointestinal:  []  Blood in stool? []  Vomited blood?  Genitourinary:  []  Burning when urinating? []  Blood in urine?  Psychiatric:  []  Major depression  Hematologic:  []  Bleeding problems? []  Problems with blood clotting?  Dermatologic:  []  Rashes or ulcers?  Constitutional:  []  Fever or chills?  Ear/Nose/Throat:  []  Change in hearing? []  Nose bleeds? []  Sore throat?  Musculoskeletal:  []  Back pain? []  Joint pain? []  Muscle pain?   Physical Examination   Vitals:   07/20/24 1519  BP: (!) 166/69  Pulse: (!) 57  Temp:  97.7 F (36.5 C)  TempSrc: Temporal  Weight: 172 lb (78 kg)   There is no height or weight on file to calculate BMI.  General:  WDWN in NAD; vital signs documented above Gait: Not observed HENT: WNL, normocephalic Pulmonary: normal non-labored breathing  Cardiac: regular Abdomen: soft, NT, no masses Skin: without rashes Vascular Exam/Pulses: Palpable left DP pulse.  Monophasic right DP/PT Doppler signals Extremities: Well-healed right fifth ray amputation site. Edema of right lower leg Musculoskeletal: no muscle wasting or atrophy  Neurologic: A&O X 3  Psychiatric:  The pt has Normal affect.  Non-Invasive Vascular imaging   ABI (07/20/2024) R:  ABI:  0.51 (0.45),  PT: mono DP: mono TBI:  41 L:  ABI: 1.03 (1.06),  PT: tri DP: tri TBI: 0.7  Medical Decision Making   [name] is a 88 y.o. male who presents for surveillance of PAD  Based on the patient's vascular studies, his ABIs bilaterally are essentially unchanged.  His right ABI is 0.51 and left ABI is 1.03 Earlier this year the patient underwent partial right fifth ray amputation for osteomyelitis.  He was last seen by podiatry a month ago and his amputation site was well-healed. At follow-up today he has no further tissue loss. He also denies any rest pain. He does have chronic right lower leg swelling that gets worse as the day goes by. I have recommended continuing compression stockings. I have also recommended regular leg elevation above the heart. On exam he has a palpable left DP pulse.  He has monophasic right DP/PT Doppler signals He can continue his aspirin , Plavix , and statin and follow-up with our office in 1 year with repeat ABIs   Ahmed Holster PA-C [facility] Office: [phone]  Clinic MD: [place]

## 2024-08-01 ENCOUNTER — Other Ambulatory Visit: Payer: Self-pay

## 2024-08-01 ENCOUNTER — Emergency Department (HOSPITAL_COMMUNITY)
Admission: EM | Admit: 2024-08-01 | Discharge: 2024-08-02 | Disposition: A | Discharge status: Home / Self Care | Attending: Emergency Medicine | Admitting: Emergency Medicine

## 2024-08-01 ENCOUNTER — Encounter (HOSPITAL_COMMUNITY): Payer: Self-pay

## 2024-08-01 DIAGNOSIS — Z7982 Long term (current) use of aspirin: Secondary | ICD-10-CM | POA: Insufficient documentation

## 2024-08-01 DIAGNOSIS — Z79899 Other long term (current) drug therapy: Secondary | ICD-10-CM | POA: Diagnosis not present

## 2024-08-01 DIAGNOSIS — I10 Essential (primary) hypertension: Secondary | ICD-10-CM | POA: Insufficient documentation

## 2024-08-01 DIAGNOSIS — M7989 Other specified soft tissue disorders: Secondary | ICD-10-CM | POA: Diagnosis present

## 2024-08-01 DIAGNOSIS — L309 Dermatitis, unspecified: Secondary | ICD-10-CM | POA: Diagnosis not present

## 2024-08-01 DIAGNOSIS — L03115 Cellulitis of right lower limb: Secondary | ICD-10-CM | POA: Insufficient documentation

## 2024-08-01 NOTE — ED Triage Notes (Addendum)
 Pt to ED c/o abnormal lab, pt went to UC today for rash on legs and arms and pain, swelling, redness to right lower leg, family member reports was notified that blood work was abnormal and to come to ED to rule ,out DVT. Family member does not recall lab results was just told high

## 2024-08-02 ENCOUNTER — Emergency Department (HOSPITAL_COMMUNITY)

## 2024-08-02 DIAGNOSIS — M7989 Other specified soft tissue disorders: Secondary | ICD-10-CM | POA: Diagnosis not present

## 2024-08-02 LAB — COMPREHENSIVE METABOLIC PANEL WITH GFR
ALT: 29 U/L (ref 0–44)
AST: 37 U/L (ref 15–41)
Albumin: 3.7 g/dL (ref 3.5–5.0)
Alkaline Phosphatase: 98 U/L (ref 38–126)
Anion gap: 9 (ref 5–15)
BUN: 14 mg/dL (ref 8–23)
CO2: 23 mmol/L (ref 22–32)
Calcium: 8.9 mg/dL (ref 8.9–10.3)
Chloride: 106 mmol/L (ref 98–111)
Creatinine, Ser: 0.86 mg/dL (ref 0.61–1.24)
GFR, Estimated: >60 mL/min
Glucose, Bld: 101 mg/dL — ABNORMAL HIGH (ref 70–99)
Potassium: 3.9 mmol/L (ref 3.5–5.1)
Sodium: 138 mmol/L (ref 135–145)
Total Bilirubin: 0.6 mg/dL (ref 0.0–1.2)
Total Protein: 7.2 g/dL (ref 6.5–8.1)

## 2024-08-02 LAB — CBC WITH DIFFERENTIAL/PLATELET
Abs Immature Granulocytes: 0.06 K/uL (ref 0.00–0.07)
Basophils Absolute: 0.1 K/uL (ref 0.0–0.1)
Basophils Relative: 1 %
Eosinophils Absolute: 0.5 K/uL (ref 0.0–0.5)
Eosinophils Relative: 9 %
HCT: 33.1 % — ABNORMAL LOW (ref 39.0–52.0)
Hemoglobin: 11.4 g/dL — ABNORMAL LOW (ref 13.0–17.0)
Immature Granulocytes: 1 %
Lymphocytes Relative: 22 %
Lymphs Abs: 1.3 K/uL (ref 0.7–4.0)
MCH: 30.4 pg (ref 26.0–34.0)
MCHC: 34.4 g/dL (ref 30.0–36.0)
MCV: 88.3 fL (ref 80.0–100.0)
Monocytes Absolute: 1.2 K/uL — ABNORMAL HIGH (ref 0.1–1.0)
Monocytes Relative: 20 %
Neutro Abs: 2.9 K/uL (ref 1.7–7.7)
Neutrophils Relative %: 47 %
Platelets: 308 K/uL (ref 150–400)
RBC: 3.75 MIL/uL — ABNORMAL LOW (ref 4.22–5.81)
RDW: 20.7 % — ABNORMAL HIGH (ref 11.5–15.5)
WBC: 6 K/uL (ref 4.0–10.5)
nRBC: 0 % (ref 0.0–0.2)

## 2024-08-02 LAB — I-STAT CG4 LACTIC ACID, ED: Lactic Acid, Venous: 0.9 mmol/L (ref 0.5–1.9)

## 2024-08-02 LAB — D-DIMER, QUANTITATIVE: D-Dimer, Quant: 1.15 ug{FEU}/mL — ABNORMAL HIGH (ref 0.00–0.50)

## 2024-08-02 MED ORDER — SODIUM CHLORIDE 0.9 % IV SOLN
1.0000 g | Freq: Once | INTRAVENOUS | Status: AC
  Administered 2024-08-02: 1 g via INTRAVENOUS
  Filled 2024-08-02: qty 10

## 2024-08-02 MED ORDER — IOHEXOL 350 MG/ML SOLN
100.0000 mL | Freq: Once | INTRAVENOUS | Status: AC | PRN
  Administered 2024-08-02: 100 mL via INTRAVENOUS

## 2024-08-02 NOTE — Discharge Instructions (Addendum)
 Take the antibiotics that you are prescribed at the urgent care.  Follow-up with your primary care doctor next week to be rechecked to make sure the infection is improving.  Return to the ER for fevers chills shortness of breath or other concerning symptoms

## 2024-08-02 NOTE — ED Provider Triage Note (Signed)
 Emergency Medicine Provider Triage Evaluation Note  ACY ORSAK , a 88 y.o. male  was evaluated in triage.  Pt complains of new redness, pain, and swelling to the right and left leg, right > left.  Outpatient had elevated d-dimer and was sent to ER for evaluation.  Denies chest pain or SOB.  Review of Systems  Positive: Leg pain Negative: SOB, chest pain  Physical Exam  BP (!) 160/66 (BP Location: Left Arm)   Pulse 67   Temp (!) 97.5 F (36.4 C)   Resp 17   Ht 5' 11 (1.803 m)   Wt 78 kg   SpO2 100%   BMI 23.99 kg/m  Gen:   Awake, no distress   Resp:  Normal effort  MSK:   Moves extremities without difficulty  Other:  Edema of bilateral legs, right leg is erythematous  Medical Decision Making  Medically screening exam initiated at 12:07 AM.  Appropriate orders placed.  Charlie LITTIE Rea was informed that the remainder of the evaluation will be completed by another provider, this initial triage assessment does not replace that evaluation, and the importance of remaining in the ED until their evaluation is complete.  Needs a room.  Notified triage and charge.   Vicky Charleston, PA-C 08/02/24 0009

## 2024-08-02 NOTE — ED Notes (Signed)
 Patient transported to vascular.

## 2024-08-02 NOTE — ED Notes (Signed)
 Pt in bed, pt is hard of hearing, states that he had a stroke in 2003, pt reports some leg pain, pt has swelling to his R leg, pedal pulse via doppler, pt reports positive sensation to touch.  Pt oriented

## 2024-08-02 NOTE — Progress Notes (Signed)
 VASCULAR LAB    Right lower extremity venous duplex has been performed.  See CV proc for preliminary results.  Relayed results to Dr. Randol via secure chat  RACHEL, [facility], RVT 08/02/2024, 10:30 AM

## 2024-08-02 NOTE — ED Notes (Signed)
 Pt in bed, family at bedside, read and reviewed d/c instructions and follow up, pt and family verbalized understanding, pt from department via wheel chair.

## 2024-08-02 NOTE — ED Provider Notes (Signed)
 " [facility] Provider Note   CSN: [id number] Arrival date & time: 08/01/24  2314     Patient presents with: Leg Pain (right)   [name] is a 88 y.o. male.    Leg Pain    Patient has a history of prior stroke hypertension hyperlipidemia arthritis peripheral vascular disease.  Patient is followed at vascular surgery.  Recently saw them earlier this month.  Patient however started noticed some rash on his arm and his right thigh.  It is not particularly painful.  He has not had any fevers.  He has also noticed some increased swelling in his right lower leg.  He chronically has swelling but it seems a bit worse.  He went to an urgent care where they did blood test.  Patient was told he need to come to the emergency room to rule out DVT.  Patient is not having any chest pain.  No shortness of breath.  Prior to Admission medications  Medication Sig Start Date End Date Taking? Authorizing Provider  acetaminophen  (TYLENOL ) 325 MG tablet Take 2 tablets (650 mg total) by mouth every 4 (four) hours as needed for headache or mild pain. 11/30/19   Barrett, Rocky SAUNDERS, PA-C  amLODipine  (NORVASC ) 2.5 MG tablet Take 1 tablet (2.5 mg total) by mouth daily. 03/19/24   Georgina Speaks, FNP  aspirin  EC 81 MG tablet Take 81 mg by mouth daily.    [provider]  cephALEXin  (KEFLEX ) 500 MG capsule Take 1 capsule (500 mg total) by mouth 2 (two) times daily. Patient not taking: Reported on 06/25/2024 03/16/24   Rhyne, Samantha J, PA-C  Cholecalciferol (VITAMIN D) 50 MCG (2000 UT) tablet Take 2,000 Units by mouth daily.    [provider]  clopidogrel  (PLAVIX ) 75 MG tablet TAKE 1 TABLET(75 MG) BY MOUTH DAILY 06/15/24   Georgina Speaks, FNP  fluticasone  (FLONASE ) 50 MCG/ACT nasal spray Place 2 sprays into both nostrils daily. 05/30/20   Moore, Janece, FNP  linezolid  (ZYVOX ) 600 MG tablet Take 1 tablet (600 mg total) by mouth 2 (two) times daily. To start  if redness spreads up leg again Patient not taking: Reported on 06/25/2024 05/04/24   Fleeta Rothman, Jomarie SAILOR, MD  methocarbamol  (ROBAXIN ) 500 MG tablet Take 1 tablet (500 mg total) by mouth 3 (three) times daily. 12/16/23   Samtani, Jai-Gurmukh, MD  Misc Natural Products (ELDERBERRY IMMUNE COMPLEX PO) Take 1 capsule by mouth daily.    [provider]  montelukast  (SINGULAIR ) 10 MG tablet Take 1 tablet by mouth daily 03/19/24   Moore, Janece, FNP  polyethylene glycol (MIRALAX  / GLYCOLAX ) 17 g packet Take 17 g by mouth daily. 11/05/19   Pokhrel, Vernal, MD  rosuvastatin  (CRESTOR ) 20 MG tablet TAKE 1 TABLET(20 MG) BY MOUTH DAILY 07/13/24   Georgina Speaks, FNP  tamsulosin  (FLOMAX ) 0.4 MG CAPS capsule Take 1 capsule (0.4 mg total) by mouth daily. 03/19/24   Georgina Speaks, FNP  tamsulosin  (FLOMAX ) 0.4 MG CAPS capsule Take 1 capsule (0.4 mg total) by mouth daily. 03/19/24   Georgina Speaks, FNP  zinc gluconate 50 MG tablet Take 50 mg by mouth daily.    [provider]    Allergies: Patient has no known allergies.    Review of Systems  Updated Vital Signs BP (!) 160/74   Pulse 77   Temp 97.7 F (36.5 C) (Oral)   Resp 17   Ht 1.803 m (5' 11)   Wt 78  kg   SpO2 100%   BMI 23.99 kg/m   Physical Exam Vitals and nursing note reviewed.  Constitutional:      Appearance: He is well-developed. He is not diaphoretic.  HENT:     Head: Normocephalic and atraumatic.     Right Ear: External ear normal.     Left Ear: External ear normal.  Eyes:     General: No scleral icterus.       Right eye: No discharge.        Left eye: No discharge.     Conjunctiva/sclera: Conjunctivae normal.  Neck:     Trachea: No tracheal deviation.  Cardiovascular:     Rate and Rhythm: Normal rate and regular rhythm.  Pulmonary:     Effort: Pulmonary effort is normal. No respiratory distress.     Breath sounds: Normal breath sounds. No stridor. No wheezing or rales.  Abdominal:     General: Bowel sounds are  normal. There is no distension.     Palpations: Abdomen is soft.     Tenderness: There is no abdominal tenderness. There is no guarding or rebound.  Musculoskeletal:        General: No tenderness or deformity.     Cervical back: Neck supple.     Right lower leg: Edema present.     Comments: Extremity warm and well perfused  Skin:    General: Skin is warm and dry.     Findings: Rash present.     Comments: Erythematous rash, well demarcated right upper extrem, right thigh  Neurological:     Mental Status: He is alert.     Cranial Nerves: No cranial nerve deficit, dysarthria or facial asymmetry.     Sensory: No sensory deficit.     Motor: No abnormal muscle tone or seizure activity.     Coordination: Coordination normal.     Comments: Right-sided hemiparesis, contracture right upper extremity  Psychiatric:        Mood and Affect: Mood normal.     (all labs ordered are listed, but only abnormal results are displayed) Labs Reviewed  CBC WITH DIFFERENTIAL/PLATELET - Abnormal; Notable for the following components:      Result Value   RBC 3.75 (*)    Hemoglobin 11.4 (*)    HCT 33.1 (*)    RDW 20.7 (*)    Monocytes Absolute 1.2 (*)    All other components within normal limits  COMPREHENSIVE METABOLIC PANEL WITH GFR - Abnormal; Notable for the following components:   Glucose, Bld 101 (*)    All other components within normal limits  D-DIMER, QUANTITATIVE - Abnormal; Notable for the following components:   D-Dimer, Quant 1.15 (*)    All other components within normal limits  CULTURE, BLOOD (ROUTINE X 2)  CULTURE, BLOOD (ROUTINE X 2)  I-STAT CG4 LACTIC ACID, ED    EKG: None  Radiology: VAS US  LOWER EXTREMITY VENOUS (DVT) (ONLY [facility] & [facility]) Result Date: 08/02/2024  Lower Venous DVT Study Patient Name:  SAAMIR ARMSTRONG  Date of Exam:   08/02/2024 Medical Rec #: [id number]        Accession #:    [id number] Date of Birth: [birth year]        Patient Gender: M Patient Age:   34 years Exam  Location:  [facility] Procedure:      VAS US  LOWER EXTREMITY VENOUS (DVT) Referring Phys: THOM Dove [place] --------------------------------------------------------------------------------  Indications: Right calf pain and swelling, rash. Other Indications: Long standing history  of chronic right lower extremity                    swelling from the ankle to the knee. History of significant                    PAD with known occlusion of the right common and iliac                    arteries, no history of intervention. Status post partial                    right 5th ray amputation 12/11/23, secondary to osteomyelitis. Limitations: Patient unable to extend leg, complaining of hip pain. Shadowing from arterial wall calcification. Comparison Study: Prior negative right LEV done 02/24/24 Performing Technologist: Alberta Lis RVS  Examination Guidelines: A complete evaluation includes B-mode imaging, spectral Doppler, color Doppler, and power Doppler as needed of all accessible portions of each vessel. Bilateral testing is considered an integral part of a complete examination. Limited examinations for reoccurring indications may be performed as noted. The reflux portion of the exam is performed with the patient in reverse Trendelenburg.  +---------+---------------+---------+-----------+----------+--------------+ RIGHT    CompressibilityPhasicitySpontaneityPropertiesThrombus Aging +---------+---------------+---------+-----------+----------+--------------+ CFV      Full           Yes      Yes                                 +---------+---------------+---------+-----------+----------+--------------+ SFJ      Full                                                        +---------+---------------+---------+-----------+----------+--------------+ FV Prox  Full           Yes      Yes                                 +---------+---------------+---------+-----------+----------+--------------+ FV Mid    Full           Yes      Yes                                 +---------+---------------+---------+-----------+----------+--------------+ FV DistalFull           Yes      Yes                                 +---------+---------------+---------+-----------+----------+--------------+ PFV      Full                                                        +---------+---------------+---------+-----------+----------+--------------+ POP      Full           Yes      Yes                                 +---------+---------------+---------+-----------+----------+--------------+  PTV      Full                                                        +---------+---------------+---------+-----------+----------+--------------+ PERO     Full                                                        +---------+---------------+---------+-----------+----------+--------------+   +----+---------------+---------+-----------+----------+--------------+ LEFTCompressibilityPhasicitySpontaneityPropertiesThrombus Aging +----+---------------+---------+-----------+----------+--------------+ CFV Full           Yes      Yes                                 +----+---------------+---------+-----------+----------+--------------+ SFJ Full                                                        +----+---------------+---------+-----------+----------+--------------+     Summary: RIGHT: - There is no evidence of deep vein thrombosis in the lower extremity.  - No cystic structure found in the popliteal fossa. - Ultrasound characteristics of enlarged lymph nodes are noted in the groin.  LEFT: - No evidence of common femoral vein obstruction.   *See table(s) above for measurements and observations. Electronically signed by Debby Robertson on 08/02/2024 at 11:03:46 AM.    Final    CT Angio Aortobifemoral W and/or Wo Contrast Result Date: 08/02/2024 EXAM: CTA ABDOMEN AND PELVIS WITH AND WITHOUT CONTRAST AND  RUNOFF CTA OF THE LOWER EXTREMITIES WITH CONTRAST 08/02/2024 07:21:27 AM TECHNIQUE: CTA images of the abdomen, pelvis and lower extremities with and without intravenous contrast. 100 mL of iohexol  (OMNIPAQUE ) 350 MG/ML injection was administered. Three-dimensional MIP/volume rendered formations were performed. Automated exposure control, iterative reconstruction, and/or weight based adjustment of the mA/kV was utilized to reduce the radiation dose to as low as reasonably achievable. COMPARISON: CT abdomen and pelvis without contrast, 10/02/22. CT chest abdomen and pelvis with contrast 11/03/2019. CLINICAL HISTORY: Peripheral arterial disease (PAD), asymptomatic. FINDINGS: VASCULATURE: AORTA: Heavy circumferential aortic calcific plaque without aneurysm, stenosis or dissection, or penetrating ulcer. CELIAC TRUNK: Calcific plaques at the celiac artery origin without aneurysm, stenosis, or dissection. SUPERIOR MESENTERIC ARTERY: Concentric calcific plaques at the superior mesenteric artery origin, 60% vessel origin stenosis, otherwise no further stenosis. INFERIOR MESENTERIC ARTERY: High-grade inferior mesenteric artery origin stenosis. The vessel otherwise opacifies well. RENAL ARTERIES: Both renal arteries are single. The proximal 1 cm of both renal arteries is heavily calcified. There is 80 to 90 percent stenosis in the proximal left renal artery and 75% stenosis in the proximal right renal artery. Both vessels are otherwise clear. No hilar branch occlusion is seen. RIGHT ILIAC ARTERIES: Heavy calcifications. The right common iliac artery and external iliac artery are occluded by thrombosis. The right internal iliac artery is occluded by calcification and thrombosis to the level of S2 where it reconstitutes. The occlusive arterial disease was present in 2021 and unchanged. RIGHT FEMORAL ARTERIES: Moderate calcifications and mild stenosis of  the right common femoral artery, which reconstitutes presumably via the  circumflex iliac arteries. Calcifications in the proximal right deep femoral artery without stenosis. Patchy calcification throughout the right superficial femoral artery. The superficial femoral artery is well patent proximally. In the mid to distal thigh it is 50 to 60 percent stenotic with a higher grade focal stenosis in the proximal hunter's canal segment. Distal to this, the hunter's canal segment is about 50% stenotic. RIGHT POPLITEAL ARTERY: Calcification in the popliteal artery with approximately 50% irregular stenosis. RIGHT CALF ARTERIES: Calcifications in the proximal popliteal trifurcation arteries causing mild to moderate stenosis in the upper calf. Below this there is uninterrupted 3-vessel runoff into the foot. LEFT ILIAC ARTERIES: Heavy calcifications. Moderate to severe irregular stenosis in the left internal iliac artery. No flow-limiting common iliac or external iliac stenosis is seen. LEFT FEMORAL ARTERIES: Patchy calcifications in the common femoral and deep left femoral arteries but no flow-limiting stenosis. Patchy calcific plaque along the superficial femoral artery noted. No stenosis up to the hunter's canal segment and high-grade irregular stenosis in the distal hunter's canal segment. LEFT POPLITEAL ARTERY: Near circumferential calcification of the left popliteal artery with calcification; there is moderate stenosis proximally and no significant stenosis distally. LEFT CALF ARTERIES: Calcifications with discontinuous flow in the anterior tibial artery which occludes in the distal calf over a short segment, then reconstitutes above the ankle with 3 vessel runoff into the foot and uninterrupted peroneal and posterior tibial flow. VENOUS SYSTEM: The major veins from the infrarenal IVC downstream are unopacified and not evaluated for patency. The portal vein and suprarenal IVC are patent. ABDOMEN AND PELVIS: LOWER CHEST: Increased opacity in the medial left upper lobe, medial right lower lobe  opacity most likely due to pneumonia with bronchial thickening in the lungs and mosaicism. Cardiomegaly with old CABG changes, heavy native CAD. No pericardial effusion. LIVER: The liver is unremarkable. GALLBLADDER AND BILE DUCTS: Gallbladder is unremarkable. No biliary ductal dilatation. SPLEEN: The spleen is unremarkable. PANCREAS: The pancreas is unremarkable. ADRENAL GLANDS: Bilateral adrenal glands demonstrate no acute abnormality. There is no adrenal mass. KIDNEYS, URETERS AND BLADDER: Multiple renal cysts, which were noted previously, largest on the right is 9.7 cm and the largest on the left 9.5 cm. No follow-up imaging is recommended. There is no mass. No stones in the kidneys or ureters. No hydronephrosis. No evidence of perinephric or periureteral stranding. There is an enlarged prostate impressing into the posterior bladder, transverse axis 6.2 cm, was previously 5.8 cm. Correlate clinically with PSA levels as the prostate appears increasingly heterogeneous and lobulated. There is a small diverticulum off the posterolateral aspect of the bladder on the left but no mass-like thickening. GI AND Bowel: Stomach and duodenal sweep demonstrate no acute abnormality. There is no bowel obstruction. No abnormal bowel wall thickening or distension. REPRODUCTIVE: Enlarged prostate as described above. PERITONEUM AND RETROPERITONEUM: No ascites or free air. No free fluid, free hemorrhage, or free air, incarcerated hernia or localizing inflammatory process within the abdomen and pelvis. LYMPH NODES: No evidence of lymphadenopathy. Interval enlargement of a right inguinal chain node now measuring 3.2 x 1.8 cm with surrounding inflammatory stranding consistent with adenitis. BONES AND SOFT TISSUES: Osteopenia and degenerative change of the spine, ankylosis of the SI joints. Mild degenerative changes of the hips and knees. No acute or significant osseous findings. The right lower extremity demonstrates diffuse edema in  the foreleg and foot without an appreciable acute deep space abnormality. Mhm There is partial atrophy  in the bilateral intrinsic plantar foot musculature, which may be seen with diabetes. The intrinsic muscle bulk appears fairly normal for age. IMPRESSION: 1. Concentric calcific plaque at the superior mesenteric artery origin with approximately 60% stenosis, otherwise patent. 2. High-grade inferior mesenteric artery origin stenosis with otherwise good distal opacification. 3. Severe bilateral proximal renal artery calcification with approximately 80-90% stenosis of the proximal left renal artery and approximately 75% stenosis of the proximal right renal artery. 4. Right common and external iliac artery occlusion by thrombosis and right internal iliac artery occlusion to the level of S2 with distal reconstitution; findings unchanged from 2021. 5. Left internal iliac artery moderate-to-severe irregular stenosis without flow-limiting common or external iliac stenosis. 6. Multilevel peripheral arterial atherosclerotic disease of the lower extremities with focal high-grade stenoses in the superficial femoral and popliteal arteries and three-vessel runoff to the fee proximal t. 7. Cardiomegaly with prior CABG changes and heavy native coronary artery atherosclerotic disease. 8. Pulmonary consolidation in the medial left upper lobe and medial right lower lobe, suspicious for multifocal pneumonia. 9. Enlarged prostate measuring 6.2 cm transverse with increased heterogeneity and lobulation; correlate with PSA. 10. Interval enlargement of a right inguinal chain lymph node measuring 3.2 x 1.8 cm with surrounding inflammatory stranding, consistent with adenitis. 11. Diffuse superficial edema in the right foreleg, ankle, and foot. No deep space process is grossly seen. No localized collection. 12. Additional detailed findings described above. Electronically signed by: Francis Quam MD 08/02/2024 08:50 AM EST RP Workstation:  [id number]   CT Angio Chest PE W and/or Wo Contrast Result Date: 08/02/2024 EXAM: CTA CHEST 08/02/2024 07:21:27 AM TECHNIQUE: CTA of the chest was performed without and with the administration of 100 mL of intravenous contrast (iohexol  (OMNIPAQUE ) 350 MG/ML injection 100 mL IOHEXOL  350 MG/ML SOLN). Multiplanar reformatted images are provided for review. MIP images are provided for review. Automated exposure control, iterative reconstruction, and/or weight based adjustment of the mA/kV was utilized to reduce the radiation dose to as low as reasonably achievable. COMPARISON: Chest and abdomen and pelvis CT with contrast 11/03/2019, abdomen and pelvis CT 10/02/2022 . CLINICAL HISTORY: Pulmonary embolism (PE) suspected, low to intermediate prob, positive D-dimer. FINDINGS: PULMONARY ARTERIES: Pulmonary arteries are adequately opacified for evaluation. The pulmonary arteries are upper normal in caliber without evidence of arterial emboli. Main pulmonary artery is normal in caliber. MEDIASTINUM: There is mild panchamber cardiomegaly. Left atrial appendage occlusion device. Healed sternotomy and old CABG (coronary artery bypass graft) changes are present. There is a tortuous aorta with moderate patchy calcifications, scattered calcifications in the great vessels. There is no aneurysm, stenosis, or dissection. The pulmonary veins are mildly prominent superiorly. LYMPH NODES: There are calcified right hilar lymph nodes. No noncalcified adenopathy is seen. LUNGS AND PLEURA: There is diffuse bronchial thickening. There is patchy ground-glass opacification in the medial left upper lobe and medial basal right lower lobe, which appears denser than mosaic attenuation and is probably pneumonia. There is mosaicism throughout much of the remaining lungs compatible with air trapping and small airway disease. There is an 8 mm right middle lobe nodule on axial 65 of series 4, and a 7 mm nodule adjacent to this on image 68. these  were present in 2021 and unchanged, consistent with a benign process. There is subpleural reticulation in the lung bases. No pleural effusion or pneumothorax. UPPER ABDOMEN: Large renal cysts are noted in the upper abdomen. SOFT TISSUES AND BONES: There is degenerative change of the thoracic spine. There is loss of  the left acromiohumeral space consistent with chronic rotator cuff arthropathy with a likely degenerative rotator cuff tear. There is partial atrophy of the left supraspinatus and infraspinatus muscles. No acute or significant osseous findings. Scattered subcentimeter thyroid  nodules with no follow-up required. IMPRESSION: 1. No evidence of pulmonary embolism. 2. Patchy ground-glass opacification in the medial left upper lobe and medial basal right lower lobe, probably pneumonia.Test 3. Mosaic attenuation throughout much of the remaining lungs, consistent with air trapping and small airways disease. 4. Two right middle lobe nodules measuring 8 mm and 7 mm, since 2021. . 5. Large renal cysts are partially visible. Electronically signed by: Francis Quam MD 08/02/2024 08:09 AM EST RP Workstation: [id number]     Procedures   Medications Ordered in the ED  iohexol  (OMNIPAQUE ) 350 MG/ML injection 100 mL (100 mLs Intravenous Contrast Given 08/02/24 0722)  cefTRIAXone  (ROCEPHIN ) 1 g in sodium chloride  0.9 % 100 mL IVPB (0 g Intravenous Stopped 08/02/24 1134)    Clinical Course as of 08/02/24 1145  Sun Aug 02, 2024  1013 CT scan shows findings concerning for possible multifocal pneumonia [JK]  1014 No signs of acute vascular occlusion possible [JK]  1014  cellulitis [JK]  1034 Doppler study negative for DVT [JK]    Clinical Course User Index [JK] Randol Simmonds, MD                                 Medical Decision Making Problems Addressed: Cellulitis of right lower extremity: acute illness or injury that poses a threat to life or bodily functions  Amount and/or Complexity of Data  Reviewed Labs: ordered. Decision-making details documented in ED Course. Radiology: ordered and independent interpretation performed.   Patient presented to the ED for evaluation of right leg swelling as well as a rash.  His rash is in both his upper extremity and his lower extremity.  This is more suggestive of some type of dermatitis.  Leg swelling however was concerning for the possibility of DVT versus cellulitis.  The patient did have CT angiogram as well as CT angio for PE ordered by the triage provider.  Patient does not have any evidence of pulmonary embolism.  He does have chronic findings associated with peripheral vascular disease.  No acute occlusion.  Doppler study was negative for DVT.  CT scan did show findings of inflammatory stranding in erythema consistent with adenitis.  I suspect his right leg swelling is associated with cellulitis.  Patient is nontoxic and afebrile.  His CT angio suggested the possibility multifocal pneumonia.  Patient's not having a cough or any respiratory symptoms .  will give him a dose of antibiotics here in the ER and otherwise appears appropriate for outpatient management.  Will have him follow-up with his primary care doctor to be rechecked.  Patient return for shortness of breath or other concerning symptoms     Final diagnoses:  Cellulitis of right lower extremity    ED Discharge Orders     None          Randol Simmonds, MD 08/02/24 1145  "

## 2024-08-02 NOTE — ED Notes (Signed)
 Blood culture number one drawn via 21 gauge butterfly from R forearm, and blood culture number two from L forearm during iv start.

## 2024-08-07 LAB — CULTURE, BLOOD (ROUTINE X 2)
Culture: NO GROWTH
Culture: NO GROWTH
Special Requests: ADEQUATE

## 2024-08-31 ENCOUNTER — Encounter: Payer: Medicare PPO | Admitting: Nurse Practitioner

## 2024-09-01 ENCOUNTER — Encounter: Payer: Medicare PPO | Admitting: Nurse Practitioner

## 2024-12-03 ENCOUNTER — Encounter: Payer: Self-pay | Admitting: Internal Medicine

## 2025-07-07 ENCOUNTER — Ambulatory Visit

## 2025-07-07 ENCOUNTER — Ambulatory Visit: Payer: Self-pay | Admitting: Nurse Practitioner
